# Patient Record
Sex: Male | Born: 1950 | Race: White | Hispanic: No | Smoking: Current some day smoker
Health system: Southern US, Community
[De-identification: ages and names within clinical notes are randomized; demographics above are authoritative.]

## PROBLEM LIST (undated history)

## (undated) DIAGNOSIS — I739 Peripheral vascular disease, unspecified: Secondary | ICD-10-CM

## (undated) DIAGNOSIS — I701 Atherosclerosis of renal artery: Secondary | ICD-10-CM

## (undated) DIAGNOSIS — Z89511 Acquired absence of right leg below knee: Secondary | ICD-10-CM

## (undated) DIAGNOSIS — K8020 Calculus of gallbladder without cholecystitis without obstruction: Secondary | ICD-10-CM

## (undated) DIAGNOSIS — F329 Major depressive disorder, single episode, unspecified: Secondary | ICD-10-CM

## (undated) DIAGNOSIS — E785 Hyperlipidemia, unspecified: Secondary | ICD-10-CM

## (undated) DIAGNOSIS — G3184 Mild cognitive impairment, so stated: Secondary | ICD-10-CM

## (undated) DIAGNOSIS — N200 Calculus of kidney: Secondary | ICD-10-CM

## (undated) DIAGNOSIS — I70229 Atherosclerosis of native arteries of extremities with rest pain, unspecified extremity: Secondary | ICD-10-CM

## (undated) DIAGNOSIS — F32A Depression, unspecified: Secondary | ICD-10-CM

## (undated) DIAGNOSIS — Z7902 Long term (current) use of antithrombotics/antiplatelets: Secondary | ICD-10-CM

## (undated) DIAGNOSIS — I2510 Atherosclerotic heart disease of native coronary artery without angina pectoris: Secondary | ICD-10-CM

## (undated) DIAGNOSIS — G459 Transient cerebral ischemic attack, unspecified: Secondary | ICD-10-CM

## (undated) DIAGNOSIS — I10 Essential (primary) hypertension: Secondary | ICD-10-CM

## (undated) DIAGNOSIS — R112 Nausea with vomiting, unspecified: Secondary | ICD-10-CM

## (undated) DIAGNOSIS — I7143 Infrarenal abdominal aortic aneurysm, without rupture: Secondary | ICD-10-CM

## (undated) DIAGNOSIS — N182 Chronic kidney disease, stage 2 (mild): Secondary | ICD-10-CM

## (undated) DIAGNOSIS — E119 Type 2 diabetes mellitus without complications: Secondary | ICD-10-CM

## (undated) DIAGNOSIS — E559 Vitamin D deficiency, unspecified: Secondary | ICD-10-CM

## (undated) DIAGNOSIS — I70221 Atherosclerosis of native arteries of extremities with rest pain, right leg: Secondary | ICD-10-CM

## (undated) DIAGNOSIS — I452 Bifascicular block: Secondary | ICD-10-CM

## (undated) DIAGNOSIS — Z7982 Long term (current) use of aspirin: Secondary | ICD-10-CM

## (undated) DIAGNOSIS — Z95828 Presence of other vascular implants and grafts: Secondary | ICD-10-CM

## (undated) DIAGNOSIS — D638 Anemia in other chronic diseases classified elsewhere: Secondary | ICD-10-CM

## (undated) DIAGNOSIS — H9011 Conductive hearing loss, unilateral, right ear, with unrestricted hearing on the contralateral side: Secondary | ICD-10-CM

## (undated) DIAGNOSIS — K5790 Diverticulosis of intestine, part unspecified, without perforation or abscess without bleeding: Secondary | ICD-10-CM

## (undated) DIAGNOSIS — I639 Cerebral infarction, unspecified: Secondary | ICD-10-CM

## (undated) DIAGNOSIS — R918 Other nonspecific abnormal finding of lung field: Secondary | ICD-10-CM

## (undated) DIAGNOSIS — I700 Atherosclerosis of aorta: Secondary | ICD-10-CM

## (undated) HISTORY — DX: Depression, unspecified: F32.A

## (undated) HISTORY — DX: Hyperlipidemia, unspecified: E78.5

## (undated) HISTORY — DX: Peripheral vascular disease, unspecified: I73.9

## (undated) HISTORY — PX: OTHER SURGICAL HISTORY: SHX169

## (undated) HISTORY — PX: BELOW KNEE LEG AMPUTATION: SUR23

## (undated) HISTORY — DX: Major depressive disorder, single episode, unspecified: F32.9

---

## 2003-07-26 ENCOUNTER — Ambulatory Visit (HOSPITAL_COMMUNITY): Admission: RE | Admit: 2003-07-26 | Discharge: 2003-07-26 | Payer: Self-pay | Admitting: *Deleted

## 2003-07-26 ENCOUNTER — Encounter: Payer: Self-pay | Admitting: *Deleted

## 2003-07-30 ENCOUNTER — Inpatient Hospital Stay (HOSPITAL_COMMUNITY): Admission: RE | Admit: 2003-07-30 | Discharge: 2003-08-01 | Payer: Self-pay | Admitting: Vascular Surgery

## 2003-08-23 ENCOUNTER — Inpatient Hospital Stay (HOSPITAL_COMMUNITY): Admission: AD | Admit: 2003-08-23 | Discharge: 2003-08-29 | Payer: Self-pay | Admitting: Vascular Surgery

## 2003-08-23 ENCOUNTER — Encounter: Payer: Self-pay | Admitting: Vascular Surgery

## 2003-08-29 ENCOUNTER — Inpatient Hospital Stay (HOSPITAL_COMMUNITY)
Admission: RE | Admit: 2003-08-29 | Discharge: 2003-09-06 | Payer: Self-pay | Admitting: Physical Medicine & Rehabilitation

## 2003-09-30 ENCOUNTER — Encounter
Admission: RE | Admit: 2003-09-30 | Discharge: 2003-12-29 | Payer: Self-pay | Admitting: Physical Medicine & Rehabilitation

## 2003-10-01 ENCOUNTER — Encounter
Admission: RE | Admit: 2003-10-01 | Discharge: 2003-12-30 | Payer: Self-pay | Admitting: Physical Medicine & Rehabilitation

## 2004-01-29 ENCOUNTER — Encounter
Admission: RE | Admit: 2004-01-29 | Discharge: 2004-04-28 | Payer: Self-pay | Admitting: Physical Medicine & Rehabilitation

## 2004-02-10 ENCOUNTER — Encounter
Admission: RE | Admit: 2004-02-10 | Discharge: 2004-05-10 | Payer: Self-pay | Admitting: Physical Medicine & Rehabilitation

## 2004-05-18 ENCOUNTER — Encounter
Admission: RE | Admit: 2004-05-18 | Discharge: 2004-08-16 | Payer: Self-pay | Admitting: Physical Medicine & Rehabilitation

## 2004-05-28 ENCOUNTER — Encounter
Admission: RE | Admit: 2004-05-28 | Discharge: 2004-08-26 | Payer: Self-pay | Admitting: Physical Medicine & Rehabilitation

## 2004-08-28 ENCOUNTER — Encounter
Admission: RE | Admit: 2004-08-28 | Discharge: 2004-11-24 | Payer: Self-pay | Admitting: Physical Medicine & Rehabilitation

## 2004-08-31 ENCOUNTER — Ambulatory Visit: Payer: Self-pay | Admitting: Physical Medicine & Rehabilitation

## 2004-11-24 ENCOUNTER — Encounter
Admission: RE | Admit: 2004-11-24 | Discharge: 2005-02-22 | Payer: Self-pay | Admitting: Physical Medicine & Rehabilitation

## 2004-12-15 ENCOUNTER — Ambulatory Visit: Payer: Self-pay | Admitting: Physical Medicine & Rehabilitation

## 2005-03-11 ENCOUNTER — Encounter
Admission: RE | Admit: 2005-03-11 | Discharge: 2005-06-09 | Payer: Self-pay | Admitting: Physical Medicine & Rehabilitation

## 2005-04-16 ENCOUNTER — Ambulatory Visit: Payer: Self-pay | Admitting: Physical Medicine & Rehabilitation

## 2005-10-04 ENCOUNTER — Encounter
Admission: RE | Admit: 2005-10-04 | Discharge: 2006-01-02 | Payer: Self-pay | Admitting: Physical Medicine & Rehabilitation

## 2005-11-12 ENCOUNTER — Ambulatory Visit: Payer: Self-pay | Admitting: Physical Medicine & Rehabilitation

## 2014-01-06 DIAGNOSIS — G459 Transient cerebral ischemic attack, unspecified: Secondary | ICD-10-CM

## 2014-01-06 HISTORY — PX: TRANSTHORACIC ECHOCARDIOGRAM: SHX275

## 2014-01-06 HISTORY — DX: Transient cerebral ischemic attack, unspecified: G45.9

## 2014-03-06 DIAGNOSIS — I639 Cerebral infarction, unspecified: Secondary | ICD-10-CM

## 2014-03-06 HISTORY — DX: Cerebral infarction, unspecified: I63.9

## 2014-03-25 ENCOUNTER — Inpatient Hospital Stay (HOSPITAL_COMMUNITY): Payer: Medicare Other

## 2014-03-25 ENCOUNTER — Emergency Department (HOSPITAL_COMMUNITY): Payer: Medicare Other

## 2014-03-25 ENCOUNTER — Inpatient Hospital Stay (HOSPITAL_COMMUNITY)
Admission: EM | Admit: 2014-03-25 | Discharge: 2014-03-28 | DRG: 065 | Disposition: A | Discharge status: Rehabilitation Facility | Payer: Medicare Other | Attending: Internal Medicine | Admitting: Internal Medicine

## 2014-03-25 ENCOUNTER — Encounter (HOSPITAL_COMMUNITY): Payer: Self-pay | Admitting: Emergency Medicine

## 2014-03-25 DIAGNOSIS — I639 Cerebral infarction, unspecified: Secondary | ICD-10-CM

## 2014-03-25 DIAGNOSIS — Z8673 Personal history of transient ischemic attack (TIA), and cerebral infarction without residual deficits: Secondary | ICD-10-CM

## 2014-03-25 DIAGNOSIS — Z720 Tobacco use: Secondary | ICD-10-CM | POA: Diagnosis present

## 2014-03-25 DIAGNOSIS — F17200 Nicotine dependence, unspecified, uncomplicated: Secondary | ICD-10-CM

## 2014-03-25 DIAGNOSIS — E785 Hyperlipidemia, unspecified: Secondary | ICD-10-CM | POA: Diagnosis present

## 2014-03-25 DIAGNOSIS — R471 Dysarthria and anarthria: Secondary | ICD-10-CM | POA: Diagnosis present

## 2014-03-25 DIAGNOSIS — I10 Essential (primary) hypertension: Secondary | ICD-10-CM

## 2014-03-25 DIAGNOSIS — S88119A Complete traumatic amputation at level between knee and ankle, unspecified lower leg, initial encounter: Secondary | ICD-10-CM

## 2014-03-25 DIAGNOSIS — I6350 Cerebral infarction due to unspecified occlusion or stenosis of unspecified cerebral artery: Secondary | ICD-10-CM

## 2014-03-25 DIAGNOSIS — I519 Heart disease, unspecified: Secondary | ICD-10-CM

## 2014-03-25 DIAGNOSIS — I6330 Cerebral infarction due to thrombosis of unspecified cerebral artery: Principal | ICD-10-CM | POA: Diagnosis present

## 2014-03-25 DIAGNOSIS — G8100 Flaccid hemiplegia affecting unspecified side: Secondary | ICD-10-CM | POA: Diagnosis present

## 2014-03-25 HISTORY — DX: Essential (primary) hypertension: I10

## 2014-03-25 HISTORY — DX: Cerebral infarction, unspecified: I63.9

## 2014-03-25 HISTORY — DX: Transient cerebral ischemic attack, unspecified: G45.9

## 2014-03-25 LAB — CBC WITH DIFFERENTIAL/PLATELET
Basophils Absolute: 0.1 10*3/uL (ref 0.0–0.1)
Basophils Relative: 1 % (ref 0–1)
EOS ABS: 0.3 10*3/uL (ref 0.0–0.7)
Eosinophils Relative: 4 % (ref 0–5)
HCT: 45.8 % (ref 39.0–52.0)
Hemoglobin: 15.6 g/dL (ref 13.0–17.0)
LYMPHS PCT: 25 % (ref 12–46)
Lymphs Abs: 2.3 10*3/uL (ref 0.7–4.0)
MCH: 31.3 pg (ref 26.0–34.0)
MCHC: 34.1 g/dL (ref 30.0–36.0)
MCV: 91.8 fL (ref 78.0–100.0)
Monocytes Absolute: 0.5 10*3/uL (ref 0.1–1.0)
Monocytes Relative: 6 % (ref 3–12)
Neutro Abs: 6 10*3/uL (ref 1.7–7.7)
Neutrophils Relative %: 64 % (ref 43–77)
PLATELETS: 208 10*3/uL (ref 150–400)
RBC: 4.99 MIL/uL (ref 4.22–5.81)
RDW: 15.1 % (ref 11.5–15.5)
WBC: 9.2 10*3/uL (ref 4.0–10.5)

## 2014-03-25 LAB — I-STAT TROPONIN, ED: Troponin i, poc: 0 ng/mL (ref 0.00–0.08)

## 2014-03-25 LAB — PROTIME-INR
INR: 1.01 (ref 0.00–1.49)
Prothrombin Time: 13.1 seconds (ref 11.6–15.2)

## 2014-03-25 LAB — COMPREHENSIVE METABOLIC PANEL
ALT: 15 U/L (ref 0–53)
AST: 19 U/L (ref 0–37)
Albumin: 3.3 g/dL — ABNORMAL LOW (ref 3.5–5.2)
Alkaline Phosphatase: 70 U/L (ref 39–117)
BILIRUBIN TOTAL: 0.3 mg/dL (ref 0.3–1.2)
BUN: 14 mg/dL (ref 6–23)
CHLORIDE: 100 meq/L (ref 96–112)
CO2: 20 meq/L (ref 19–32)
Calcium: 9.2 mg/dL (ref 8.4–10.5)
Creatinine, Ser: 0.82 mg/dL (ref 0.50–1.35)
GFR calc Af Amer: >90 mL/min (ref >90)
GFR calc non Af Amer: >90 mL/min (ref >90)
Glucose, Bld: 171 mg/dL — ABNORMAL HIGH (ref 70–99)
Potassium: 4.4 mEq/L (ref 3.7–5.3)
SODIUM: 135 meq/L — AB (ref 137–147)
Total Protein: 7.5 g/dL (ref 6.0–8.3)

## 2014-03-25 LAB — URINALYSIS, ROUTINE W REFLEX MICROSCOPIC
Bilirubin Urine: NEGATIVE
Glucose, UA: NEGATIVE mg/dL
KETONES UR: NEGATIVE mg/dL
Nitrite: NEGATIVE
PROTEIN: 100 mg/dL — AB
Specific Gravity, Urine: 1.016 (ref 1.005–1.030)
UROBILINOGEN UA: 0.2 mg/dL (ref 0.0–1.0)
pH: 6.5 (ref 5.0–8.0)

## 2014-03-25 LAB — RAPID URINE DRUG SCREEN, HOSP PERFORMED
AMPHETAMINES: NOT DETECTED
BENZODIAZEPINES: NOT DETECTED
Barbiturates: NOT DETECTED
Cocaine: NOT DETECTED
Opiates: NOT DETECTED
TETRAHYDROCANNABINOL: NOT DETECTED

## 2014-03-25 LAB — URINE MICROSCOPIC-ADD ON

## 2014-03-25 LAB — APTT: APTT: 33 s (ref 24–37)

## 2014-03-25 MED ORDER — SODIUM CHLORIDE 0.9 % IV SOLN
INTRAVENOUS | Status: DC
  Administered 2014-03-25: 14:00:00 via INTRAVENOUS
  Administered 2014-03-26: 1000 mL via INTRAVENOUS

## 2014-03-25 MED ORDER — MORPHINE SULFATE 2 MG/ML IJ SOLN: 2.0000 mg | INTRAMUSCULAR | Status: DC | PRN

## 2014-03-25 MED ORDER — ASPIRIN 325 MG PO TABS
325.0000 mg | ORAL_TABLET | Freq: Every day | ORAL | Status: DC
  Administered 2014-03-25 – 2014-03-28 (×4): 325 mg via ORAL
  Filled 2014-03-25 (×4): qty 1

## 2014-03-25 MED ORDER — ACETAMINOPHEN 325 MG PO TABS: 650.0000 mg | ORAL_TABLET | Freq: Four times a day (QID) | ORAL | Status: DC | PRN

## 2014-03-25 MED ORDER — NICOTINE 14 MG/24HR TD PT24
14.0000 mg | MEDICATED_PATCH | Freq: Every day | TRANSDERMAL | Status: DC
  Administered 2014-03-25 – 2014-03-28 (×4): 14 mg via TRANSDERMAL
  Filled 2014-03-25 (×6): qty 1

## 2014-03-25 MED ORDER — ACETAMINOPHEN 650 MG RE SUPP: 650.0000 mg | Freq: Four times a day (QID) | RECTAL | Status: DC | PRN

## 2014-03-25 MED ORDER — SODIUM CHLORIDE 0.9 % IJ SOLN
3.0000 mL | Freq: Two times a day (BID) | INTRAMUSCULAR | Status: DC
  Administered 2014-03-25 – 2014-03-28 (×6): 3 mL via INTRAVENOUS

## 2014-03-25 MED ORDER — HEPARIN SODIUM (PORCINE) 5000 UNIT/ML IJ SOLN
5000.0000 [IU] | Freq: Three times a day (TID) | INTRAMUSCULAR | Status: DC
Start: 2014-03-25 — End: 2014-03-28
  Administered 2014-03-25 – 2014-03-28 (×9): 5000 [IU] via SUBCUTANEOUS
  Filled 2014-03-25 (×12): qty 1

## 2014-03-25 NOTE — Progress Notes (Signed)
Rehab Admissions Coordinator Note:  Patient was screened by Cleatrice Burke for appropriateness for an Inpatient Acute Rehab Consult per OT recommendation.  At this time, we are recommending Inpatient Rehab consult. Please place order.  Audelia Acton [facility] 03/25/2014, 2:34 PM  I can be reached at [phone].

## 2014-03-25 NOTE — Progress Notes (Signed)
Report received from ED at [place] and pt arrived on unit via stretcher on 1005. Pt A&O x4, RUE remains flaccid; right facial droop remains; pt IV intact and saline locked; pt oriented to unit, denies any pain; pt placed on telemetry; family remains at bedside; NIH 5; call light within reach and will continue to monitor pt quietly. Francis Gaines Rajat Staver RN.

## 2014-03-25 NOTE — Progress Notes (Signed)
OT Cancellation Note  Patient Details Name: Jordan Arellano MRN: [id number] DOB: November 25, 1951   Cancelled Treatment:    Reason Eval/Treat Not Completed: Medical issues which prohibited therapy;Other (comment) (pt on bedrest.)  Benito Mccreedy OTR/L [phone] 03/25/2014, 1:10 PM

## 2014-03-25 NOTE — H&P (Signed)
[facility] History and Physical  [name] URK:[id number] DOB: [birth year] DOA: 03/25/2014  Referring physician: Emergency Department PCP: No PCP Per Patient  Specialists:   Chief Complaint: R sided weakness  HPI: Jordan Arellano is a 63 y.o. male  With a hx of hypertension and tobacco abuse who presents to the ED with R sided weakness upon awakening earlier this AM. The patient recalled feeling normal when going to bed the night prior. In the ED, the patient was found to have an unremarkable head CT. Neurology was consulted and the Hospitalist consulted for admission.  Review of Systems: Per above, the remainder of the 10pt ros reviewed and are neg  Past Medical History  Diagnosis Date  . Hypertension   . TIA (transient ischemic attack)    Past Surgical History  Procedure Laterality Date  . Ambutation    . Below knee leg amputation Right    Social History:  reports that he has been smoking Cigarettes.  He has been smoking about 0.50 packs per day. He does not have any smokeless tobacco history on file. He reports that he does not drink alcohol. His drug history is not on file.  where does patient live--home, ALF, SNF? and with whom if at home?  Can patient participate in ADLs?  No Known Allergies  History reviewed. No pertinent family history.  (be sure to complete)  Prior to Admission medications   Medication Sig Start Date End Date Taking? Authorizing Provider  olmesartan (BENICAR) 40 MG tablet Take 40 mg by mouth daily.   Yes Historical Provider, MD   Physical Exam: Filed Vitals:   03/25/14 0826 03/25/14 0830 03/25/14 0913 03/25/14 0930  BP:  142/91  152/85  Pulse: 71 70  72  Temp:   98 F (36.7 C)   TempSrc:      Resp: 14 18  16   Height:      Weight:      SpO2: 98% 96%  97%     General:  Awake, in nad  Eyes: PERRL B  ENT: membranes moist, dentition fair  Neck: trachea midline, neck supple  Cardiovascular: regular, s1, s2  Respiratory: normal  resp effort, no wheezing  Abdomen: soft, nondistended  Skin: normal skin turgor, no abnormal skin lesions seen  Musculoskeletal: perfused, no clubbing  Psychiatric: mood/affect normal // no auditory/visual hallucinations  Neurologic: R sided facial droop w/ slurred speech // flacid RUE, 3/5 strength in RLE, 5/5 strength on L side  Labs on Admission:  Basic Metabolic Panel:  Recent Labs Lab 03/25/14 0740  NA 135*  K 4.4  CL 100  CO2 20  GLUCOSE 171*  BUN 14  CREATININE 0.82  CALCIUM 9.2   Liver Function Tests:  Recent Labs Lab 03/25/14 0740  AST 19  ALT 15  ALKPHOS 70  BILITOT 0.3  PROT 7.5  ALBUMIN 3.3*   No results found for this basename: LIPASE, AMYLASE,  in the last 168 hours No results found for this basename: AMMONIA,  in the last 168 hours CBC:  Recent Labs Lab 03/25/14 0740  WBC 9.2  NEUTROABS 6.0  HGB 15.6  HCT 45.8  MCV 91.8  PLT 208   Cardiac Enzymes: No results found for this basename: CKTOTAL, CKMB, CKMBINDEX, TROPONINI,  in the last 168 hours  BNP (last 3 results) No results found for this basename: PROBNP,  in the last 8760 hours CBG: No results found for this basename: GLUCAP,  in the last 168 hours  Radiological Exams on  Admission: Ct Head Wo Contrast  03/25/2014   CLINICAL DATA:  Right-sided weakness and facial droop.  EXAM: CT HEAD WITHOUT CONTRAST  TECHNIQUE: Contiguous axial images were obtained from the base of the skull through the vertex without intravenous contrast.  COMPARISON:  None.  FINDINGS: Brain does not show accelerated atrophy. There is no evidence of old or acute focal infarction, mass lesion, hemorrhage, hydrocephalus or extra-axial collection. There is extensive atherosclerotic calcification of the major vessels at the base of the brain, most notably the right vertebral artery and both cavernous carotid arteries. The calvarium is unremarkable. There is mild mucosal inflammatory change of the paranasal sinuses.   IMPRESSION: Normal appearance of the brain itself. No evidence of acute infarction. Extensive atherosclerotic calcification of the major vessels the base of the brain.   Electronically Signed   By: Nelson Chimes M.D.   On: 03/25/2014 07:59    EKG: Independently reviewed. NSR  Assessment/Plan Principal Problem:   CVA (cerebral infarction) Active Problems:   HTN (hypertension)   Tobacco abuse  1. Suspected CVA 1. CT neg 2. Await MRI 3. 2d echo and carotids ordered 4. Pt is antiplatelet naive. Will start empiric 325mg  ASA 5. Allow permissive htn 6. Admit to med-tele 7. Neuro consulted 8. Consult PT/OT/SLP 2. HTN 1. Allow permissive htn per above 2. Hold bp med 3. Tobacco abuse 1. 97min tobacco cessation done face to face 2. Nicotine patch 4. DVT prophylaxis 1. Heparin subQ  Code Status: Full (must indicate code status--if unknown or must be presumed, indicate so) Family Communication: Pt and son with daughter in law in room (indicate person spoken with, if applicable, with phone number if by telephone) Disposition Plan: Pending (indicate anticipated LOS)  Time spent: 59min  Stephen K Chiu [facility] Pager [phone]  If 7PM-7AM, please contact night-coverage [email or web address] Password [facility] 03/25/2014, 9:39 AM

## 2014-03-25 NOTE — Progress Notes (Signed)
CARE MANAGEMENT NOTE 03/25/2014  Patient:  Jordan Arellano, Jordan Arellano   Account Number:  [id number]  Date Initiated:  03/25/2014  Documentation initiated by:  Olga Coaster  Subjective/Objective Assessment:   ADMITTED FOR STROKE WORKUP     Action/Plan:   CM FOLLOWING FOR DCP   Anticipated DC Date:  04/01/2014   Anticipated DC Plan:  AWAITING FOR PT/OT EVALS FOR DISPOSITION NEEDS WITH ATTENDING MD IN AGREEMENT     DC Planning Services  CM consult       Status of service:  In process, will continue to follow Medicare Important Message given?  NA - LOS <3 / Initial given by admissions (If response is "NO", the following Medicare IM given date fields will be blank)  Per UR Regulation:  Reviewed for med. necessity/level of care/duration of stay  Comments:  4/20/2015Mindi Slicker RN,BSN,MHA [phone]

## 2014-03-25 NOTE — Progress Notes (Signed)
Pt returned back from MRI in room. Francis Gaines Josanna Hefel RN.

## 2014-03-25 NOTE — Consult Note (Signed)
Referring Physician: Earlie Counts    Chief Complaint: Stroke  HPI:                                                                                                                                         Jordan Arellano is an 63 y.o. male who went to sleep last night feeling normal at 9:30 PM.  He awoke at 0330 am and noted that his left arm was weak but he was able to move it antigravity.  As the morning has progressed his right arm has become flaccid. In addition he noted his left arm has decreased sensation from shoulder to hand and left leg from knee below is weak. He denies any sensation changes in his left leg or face. He admits to smoking ~pack per day. He does not take ASA.   Date last known well: Date: 03/24/2014 Time last known well: Time: 21:30 tPA Given: No: out of the window NIHSS 7, 3 for RUE, 2 for face, 1 for dysarthria 1 for facial sensation  Past Medical History  Diagnosis Date  . Hypertension   . TIA (transient ischemic attack)     Past Surgical History  Procedure Laterality Date  . Ambutation    . Below knee leg amputation Right     Family History  Problem Relation Age of Onset  . Hypertension Mother   . Hypertension Father    Social History:  reports that he has been smoking Cigarettes.  He has been smoking about 0.50 packs per day. He does not have any smokeless tobacco history on file. He reports that he does not drink alcohol. His drug history is not on file.  Allergies: No Known Allergies  Medications:                                                                                                                           Current Facility-Administered Medications  Medication Dose Route Frequency Provider Last Rate Last Dose  . 0.9 %  sodium chloride infusion   Intravenous Continuous Donne Hazel, MD      . acetaminophen (TYLENOL) tablet 650 mg  650 mg Oral Q6H PRN Donne Hazel, MD       Or  . acetaminophen (TYLENOL) suppository 650 mg  650 mg Rectal Q6H PRN  Donne Hazel, MD      . aspirin  tablet 325 mg  325 mg Oral Daily Jerald Kief, MD      . heparin injection 5,000 Units  5,000 Units Subcutaneous 3 times per day Jerald Kief, MD      . morphine 2 MG/ML injection 2 mg  2 mg Intravenous Q4H PRN Jerald Kief, MD      . nicotine (NICODERM CQ - dosed in mg/24 hours) patch 14 mg  14 mg Transdermal Daily Jerald Kief, MD      . sodium chloride 0.9 % injection 3 mL  3 mL Intravenous Q12H Jerald Kief, MD       Current Outpatient Prescriptions  Medication Sig Dispense Refill  . olmesartan (BENICAR) 40 MG tablet Take 40 mg by mouth daily.         ROS:                                                                                                                                       History obtained from the patient  General ROS: negative for - chills, fatigue, fever, night sweats, weight gain or weight loss Psychological ROS: negative for - behavioral disorder, hallucinations, memory difficulties, mood swings or suicidal ideation Ophthalmic ROS: negative for - blurry vision, double vision, eye pain or loss of vision ENT ROS: negative for - epistaxis, nasal discharge, oral lesions, sore throat, tinnitus or vertigo Allergy and Immunology ROS: negative for - hives or itchy/watery eyes Hematological and Lymphatic ROS: negative for - bleeding problems, bruising or swollen lymph nodes Endocrine ROS: negative for - galactorrhea, hair pattern changes, polydipsia/polyuria or temperature intolerance Respiratory ROS: negative for - cough, hemoptysis, shortness of breath or wheezing Cardiovascular ROS: negative for - chest pain, dyspnea on exertion, edema or irregular heartbeat Gastrointestinal ROS: negative for - abdominal pain, diarrhea, hematemesis, nausea/vomiting or stool incontinence Genito-Urinary ROS: negative for - dysuria, hematuria, incontinence or urinary frequency/urgency Musculoskeletal ROS: negative for - joint swelling or muscular  weakness Neurological ROS: as noted in HPI Dermatological ROS: negative for rash and skin lesion changes  Neurologic Examination:                                                                                                      Blood pressure 169/93, pulse 66, temperature 98 F (36.7 C), temperature source Oral, resp. rate 19, height 6\' 1"  (1.854 m), weight 77.111 kg (170 lb), SpO2 95.00%.   Mental Status: Alert, oriented, thought content appropriate.  Speech dysarthric without evidence  of aphasia.  Able to follow 3 step commands without difficulty. Cranial Nerves: II: Discs flat bilaterally; Visual fields grossly normal, pupils equal, round, reactive to light and accommodation III,IV, VI: ptosis not present, extra-ocular motions intact bilaterally V,VII: smile asymmetric on the right, facial light touch sensation normal bilaterally. Decreased to pin on right VIII: hearing normal bilaterally IX,X: gag reflex present XI: bilateral shoulder shrug XII: midline tongue extension without atrophy or fasciculations  Motor: Right : Upper extremity   1/5 with flexion, internal rotation   Left:     Upper extremity   0/5  Lower extremity   4-5/5 proximal, 0/5 distal BKA Lower extremity   5/5 Tone and bulk:normal tone throughout; no atrophy noted Sensory: Pinprick and light touch intact throughout, bilaterally Deep Tendon Reflexes:  Right: Upper Extremity   Left: Upper extremity   biceps (C-5 to C-6) 2/4   biceps (C-5 to C-6) 2/4 tricep (C7) 2/4    triceps (C7) 2/4 Brachioradialis (C6) 2/4  Brachioradialis (C6) 2/4  Lower Extremity Lower Extremity  quadriceps (L-2 to L-4) 2/4   quadriceps (L-2 to L-4) 2/4 Achilles (S1) BKA   Achilles (S1) 2/4  Plantars: Right: BKA   Left: downgoing Cerebellar: normal finger-to-nose on the left,  normal heel-to-shin test on the left Gait: not tested due to multiple leads. CV: pulses palpable throughout    Lab Results: Basic Metabolic  Panel:  Recent Labs Lab 03/25/14 0740  NA 135*  K 4.4  CL 100  CO2 20  GLUCOSE 171*  BUN 14  CREATININE 0.82  CALCIUM 9.2    Liver Function Tests:  Recent Labs Lab 03/25/14 0740  AST 19  ALT 15  ALKPHOS 70  BILITOT 0.3  PROT 7.5  ALBUMIN 3.3*   No results found for this basename: LIPASE, AMYLASE,  in the last 168 hours No results found for this basename: AMMONIA,  in the last 168 hours  CBC:  Recent Labs Lab 03/25/14 0740  WBC 9.2  NEUTROABS 6.0  HGB 15.6  HCT 45.8  MCV 91.8  PLT 208    Cardiac Enzymes: No results found for this basename: CKTOTAL, CKMB, CKMBINDEX, TROPONINI,  in the last 168 hours  Lipid Panel: No results found for this basename: CHOL, TRIG, HDL, CHOLHDL, VLDL, LDLCALC,  in the last 168 hours  CBG: No results found for this basename: GLUCAP,  in the last 168 hours  Microbiology: No results found for this or any previous visit.  Coagulation Studies:  Recent Labs  03/25/14 0740  LABPROT 13.1  INR 1.01    Imaging: Ct Head Wo Contrast  03/25/2014   CLINICAL DATA:  Right-sided weakness and facial droop.  EXAM: CT HEAD WITHOUT CONTRAST  TECHNIQUE: Contiguous axial images were obtained from the base of the skull through the vertex without intravenous contrast.  COMPARISON:  None.  FINDINGS: Brain does not show accelerated atrophy. There is no evidence of old or acute focal infarction, mass lesion, hemorrhage, hydrocephalus or extra-axial collection. There is extensive atherosclerotic calcification of the major vessels at the base of the brain, most notably the right vertebral artery and both cavernous carotid arteries. The calvarium is unremarkable. There is mild mucosal inflammatory change of the paranasal sinuses.  IMPRESSION: Normal appearance of the brain itself. No evidence of acute infarction. Extensive atherosclerotic calcification of the major vessels the base of the brain.   Electronically Signed   By: Nelson Chimes M.D.   On:  03/25/2014 07:59  Assessment and plan discussed with with attending physician and they are in agreement.    Etta Quill PA-C [facility] [phone]  03/25/2014, 10:02 AM   I have seen and evaluated the patient. I have reviewed the above note and made appropriate changes.    Assessment: 63 y.o. male presenting with right facial droop, dysarthria, right arm flaccidity and distal leg weakness.  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  Patient is out of window for tPA and intervention.   Stroke Risk Factors - hypertension    Plan: 1. HgbA1c, fasting lipid panel 2. MRI, MRA  of the brain without contrast 3. PT consult, OT consult, Speech consult 4. Echocardiogram 5. Carotid dopplers 6. Prophylactic therapy-Antiplatelet med: Aspirin - dose 81 mg daily 7. Risk  Factor modification   Roland Rack, MD [facility] [phone]  If 7pm- 7am, please page neurology on call as listed in [place].

## 2014-03-25 NOTE — ED Notes (Signed)
Patient transported to CT 

## 2014-03-25 NOTE — ED Notes (Signed)
Admitting at bedside 

## 2014-03-25 NOTE — Progress Notes (Signed)
Pt off to MRI. Delia Heady RN

## 2014-03-25 NOTE — ED Notes (Signed)
Neurology at bedside.

## 2014-03-25 NOTE — ED Provider Notes (Signed)
CSN: [id number]     Arrival date & time 03/25/14  0709 History   First MD Initiated Contact with Patient 03/25/14 [phone]     Chief Complaint  Patient presents with  . Stroke Symptoms     (Consider location/radiation/quality/duration/timing/severity/associated sxs/prior Treatment) The history is provided by the patient.   Patient here with right-sided weakness that began at approximately 4 hours prior to arrival. Patient went to bed at approximately 9:30 in the evening and woke up at approximately 3:30 in the morning with right-sided weakness which gradually progressed until this morning. He is out of the TPA window. He denies any headache. No blurred vision. Symptoms have been progressively worse. He is now unable to lift up his right arm. 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 Notes some trouble speaking. Complains of right-sided facial weakness. Denies any trouble closing his eye. No neck pain. No recent history of trauma. Symptoms persistent and nothing makes it better or worse. No treatment used prior to arrival Past Medical History  Diagnosis Date  . Hypertension   . TIA (transient ischemic attack)    Past Surgical History  Procedure Laterality Date  . Ambutation    . Below knee leg amputation Right    History reviewed. No pertinent family history. History  Substance Use Topics  . Smoking status: Current Some Day Smoker -- 0.50 packs/day    Types: Cigarettes  . Smokeless tobacco: Not on file  . Alcohol Use: No    Review of Systems  All other systems reviewed and are negative.     Allergies  Review of patient's allergies indicates not on file.  Home Medications   Prior to Admission medications   Not on File   BP 154/91  Pulse 72  Temp(Src) 98 F (36.7 C)  Resp 17  Ht 6\' 1"  (1.854 m)  Wt 170 lb (77.111 kg)  BMI 22.43 kg/m2  SpO2 96% Physical Exam  Nursing note and vitals reviewed. Constitutional: He is  oriented to person, place, and time. He appears well-developed and well-nourished.  Non-toxic appearance. No distress.  HENT:  Head: Normocephalic and atraumatic.  Eyes: Conjunctivae, EOM and lids are normal. Pupils are equal, round, and reactive to light.  Neck: Normal range of motion. Neck supple. No tracheal deviation present. No mass present.  Cardiovascular: Normal rate, regular rhythm and normal heart sounds.  Exam reveals no gallop.   No murmur heard. Pulmonary/Chest: Effort normal and breath sounds normal. No stridor. No respiratory distress. He has no decreased breath sounds. He has no wheezes. He has no rhonchi. He has no rales.  Abdominal: Soft. Normal appearance and bowel sounds are normal. He exhibits no distension. There is no tenderness. There is no rebound and no CVA tenderness.  Musculoskeletal: Normal range of motion. He exhibits no edema and no tenderness.  Below-the-knee amputation noted  Neurological: He is alert and oriented to person, place, and time. A cranial nerve deficit and sensory deficit is present. Coordination abnormal. GCS eye subscore is 4. GCS verbal subscore is 5. GCS motor subscore is 6.  Flaccid paralysis noted to right upper extremity. Right-sided facial droop noted. Patient unable to perform finger to nose testing on the right. Left upper and left lower extremity strength normal. Speech normal  Skin: Skin is warm and dry. No abrasion and no rash noted.  Psychiatric: He has a normal mood and affect. His speech is normal and behavior is normal.  ED Course  Procedures (including critical care time) Labs Review Labs Reviewed  PROTIME-INR  APTT  CBC  DIFFERENTIAL  COMPREHENSIVE METABOLIC PANEL  URINE RAPID DRUG SCREEN (HOSP PERFORMED)  URINALYSIS, ROUTINE W REFLEX MICROSCOPIC  CBC WITH DIFFERENTIAL  I-STAT TROPOININ, ED    Imaging Review No results found.   EKG Interpretation   Date/Time:  Monday March 25 2014 07:18:29 EDT Ventricular Rate:   86 PR Interval:  143 QRS Duration: 111 QT Interval:  380 QTC Calculation: 454 R Axis:   -59 Text Interpretation:  Sinus rhythm Abnormal R-wave progression, early  transition Inferior infarct, old Confirmed by Ramin Zoll  MD, Lavell Ridings ([id number])  on 03/25/2014 7:34:55 AM      MDM   Final diagnoses:  None   Spoke to neurology and patient is out of the TPA window. Patient to be admitted to the medicine service      Leota Jacobsen, MD 03/25/14 [phone]

## 2014-03-25 NOTE — ED Notes (Addendum)
Pt from home, c/o right sided paralysis and facial droop,Pt states he went to bed around 930 last night, w/ no deficients. Pt lives alone, no LSN. Pt states he woke up with symptoms. NSD

## 2014-03-25 NOTE — Evaluation (Signed)
Clinical/Bedside Swallow Evaluation Patient Details  Name: Jordan Arellano MRN: [id number] Date of Birth: [birth year]  Today's Date: 03/25/2014 Time: 5462-7035 SLP Time Calculation (min): 27 min  Past Medical History:  Past Medical History  Diagnosis Date  . Hypertension   . TIA (transient ischemic attack)   . Stroke    Past Surgical History:  Past Surgical History  Procedure Laterality Date  . Ambutation    . Below knee leg amputation Right    HPI:  Pt admitted 4/20 with acute nonhemorrhagic left paracentral pontine infarct, Arterial venous malformation left hippocampus.   Assessment / Plan / Recommendation Clinical Impression  Pt presents with mild right-sided lingual/labial weakness that does not appear to impede pt's ability to orally manipulate and control PO trials. No overt s/s of aspiration were observed during evaluation, although pt reports that his swallowing felt "funny" during lunch meal. Pt was unable to elaborate, however given location of acute CVA and subjective report by pt, will f/u x1 to assess diet tolerance with regular textures and thin liquids, and to provide education. Given that pt has been admitted with acute CVA and had difficulty with recall during evaluation, recommend ordering cognitive-linguistic evaluation.    Aspiration Risk  Mild    Diet Recommendation Regular;Thin liquid   Liquid Administration via: Cup;Straw Medication Administration: Whole meds with liquid Supervision: Patient able to self feed;Intermittent supervision to cue for compensatory strategies Compensations: Slow rate;Small sips/bites Postural Changes and/or Swallow Maneuvers: Seated upright 90 degrees;Upright 30-60 min after meal    Other  Recommendations Oral Care Recommendations: Oral care BID   Follow Up Recommendations  None (none anticipated for swallowing; recommend SLE to more fully assess cognitive function)    Frequency and Duration min 1 x/week  1 week   Pertinent  Vitals/Pain N/A    SLP Swallow Goals     Swallow Study Prior Functional Status  Type of Home: House Available Help at Discharge: Family;Available PRN/intermittently    General Date of Onset: 03/25/14 HPI: Pt admitted 4/20 with acute nonhemorrhagic left paracentral pontine infarct, Arterial venous malformation left hippocampus. Type of Study: Bedside swallow evaluation Previous Swallow Assessment: none in chart Diet Prior to this Study: Regular;Thin liquids Temperature Spikes Noted: No Respiratory Status: Room air History of Recent Intubation: No Behavior/Cognition: Alert;Cooperative;Pleasant mood Oral Cavity - Dentition: Edentulous (pt had top/bottom dentures available although declined to wear them for evaluation) Self-Feeding Abilities: Able to feed self;Needs assist Patient Positioning: Upright in bed Baseline Vocal Quality: Clear Volitional Cough: Strong Volitional Swallow: Able to elicit    Oral/Motor/Sensory Function Overall Oral Motor/Sensory Function: Impaired (mild) Labial ROM: Reduced right Labial Symmetry: Abnormal symmetry right Labial Strength: Within Functional Limits Lingual ROM: Within Functional Limits Lingual Symmetry: Within Functional Limits Lingual Strength: Reduced Facial ROM: Within Functional Limits Facial Symmetry: Within Functional Limits Facial Strength: Within Functional Limits Mandible: Within Functional Limits   Ice Chips Ice chips: Not tested   Thin Liquid Thin Liquid: Within functional limits Presentation: Straw;Self Fed    Nectar Thick Nectar Thick Liquid: Not tested   Honey Thick Honey Thick Liquid: Not tested   Puree Puree: Within functional limits Presentation: Self Fed;Spoon   Solid   GO    Solid: Within functional limits Presentation: Self Fed        Germain Osgood, M.A. CCC-SLP [phone]  Germain Osgood 03/25/2014,4:39 PM

## 2014-03-25 NOTE — Evaluation (Addendum)
Occupational Therapy Evaluation Patient Details Name: Jordan Arellano MRN: [id number] DOB: February 07, 1951 Today's Date: 03/25/2014    History of Present Illness Acute nonhemorrhagic left paracentral pontine infarct, Arterial venous malformation left hippocampus. Pt has right BKA.   Clinical Impression   Pt presents with below problem list. Pt independent with ADLs, PTA. Feel pt will benefit from acute OT to increase independence prior to d/c. Feel pt is great CIR candidate. Pt did not have right prosthetic-pt states that son is bringing this in.   Follow Up Recommendations  CIR;Supervision/Assistance - 24 hour    Equipment Recommendations  3 in 1 bedside comode    Recommendations for Other Services       Precautions / Restrictions Precautions Precautions: Fall Restrictions Weight Bearing Restrictions: No      Mobility Bed Mobility Overal bed mobility: Needs Assistance Bed Mobility: Supine to Sit     Supine to sit: Mod assist     General bed mobility comments: assistance with trunk; cues for technique.  Transfers Overall transfer level: Needs assistance   Transfers: Sit to/from Stand Sit to Stand: Max assist;From elevated surface         General transfer comment: performed sit <> stand x2. Pt unsteady, but was able to finally let go of rail for brief period and hold OT's hand.    Balance                                            ADL Overall ADL's : Needs assistance/impaired Eating/Feeding: Set up;Supervision/ safety;Sitting   Grooming: Minimal assistance;Sitting   Upper Body Bathing: Moderate assistance;Sitting   Lower Body Bathing: Moderate assistance;Bed level   Upper Body Dressing : Moderate assistance;Sitting   Lower Body Dressing: Maximal assistance;Bed level   Toilet Transfer: Maximal assistance (sit to stand from bed)   Toileting- Clothing Manipulation and Hygiene: Minimal assistance;Sitting/lateral lean       Functional  mobility during ADLs: Maximal assistance (sit to stand from bed) General ADL Comments: Educated to be careful with RUE as he has decreased sensation and to support RUE with pillow. Pt sat EOB and urinated in urinated. OT assisted in donning left sock.     Vision                     Perception     Praxis      Pertinent Vitals/Pain No pain reported.      Hand Dominance     Extremity/Trunk Assessment Upper Extremity Assessment Upper Extremity Assessment: RUE deficits/detail RUE Deficits / Details: flaccid; able to elevate shoulder RUE Sensation: decreased light touch   Lower Extremity Assessment Lower Extremity Assessment: Defer to PT evaluation          Cognition Arousal/Alertness: Awake/alert Behavior During Therapy: WFL for tasks assessed/performed Overall Cognitive Status: Within Functional Limits for tasks assessed                     General Comments       Exercises       Shoulder Instructions      Home Living Family/patient expects to be discharged to:: Private residence Living Arrangements: Alone Available Help at Discharge: Family;Available PRN/intermittently Type of Home: House Home Access: Stairs to enter [employer] of Steps: 2 in back; 4 in front Entrance Stairs-Rails:  (no rail in back; left rail in front) Home Layout:  One level     Bathroom Shower/Tub: Teacher, early years/pre: Standard     Home Equipment: Other (comment) (has a seat to use for shower)          Prior Functioning/Environment Level of Independence: Independent             OT Diagnosis: Hemiplegia dominant side   OT Problem List: Decreased strength;Impaired balance (sitting and/or standing);Decreased knowledge of use of DME or AE;Decreased knowledge of precautions;Impaired UE functional use;Impaired sensation;Decreased coordination   OT Treatment/Interventions: Self-care/ADL training;Neuromuscular education;DME and/or AE  instruction;Therapeutic activities;Patient/family education;Balance training    OT Goals(Current goals can be found in the care plan section) Acute Rehab OT Goals Patient Stated Goal: not stated OT Goal Formulation: With patient Time For Goal Achievement: 04/01/14 Potential to Achieve Goals: Good ADL Goals Pt Will Perform Upper Body Dressing: with set-up;with supervision;sitting Pt Will Perform Lower Body Dressing: with min assist;sit to/from stand Pt Will Transfer to Toilet: with min assist;ambulating Pt Will Perform Toileting - Clothing Manipulation and hygiene: with supervision;sit to/from stand Additional ADL Goal #1: Pt will perform bed mobility at Supervision level as precursor for ADLs.  OT Frequency: Min 2X/week   Barriers to D/C: Decreased caregiver support          Co-evaluation              End of Session Equipment Utilized During Treatment: Gait belt Nurse Communication: Mobility status;Other (comment) (support RUE with pillow)  Activity Tolerance: Patient tolerated treatment well Patient left: in bed;with call bell/phone within reach;with bed alarm set   Time: 1318-1330 OT Time Calculation (min): 12 min Charges:  OT General Charges $OT Visit: 1 Procedure OT Evaluation $Initial OT Evaluation Tier I: 1 Procedure G-CodesBenito Arellano OTR/L [phone] 03/25/2014, 2:24 PM

## 2014-03-26 DIAGNOSIS — I6330 Cerebral infarction due to thrombosis of unspecified cerebral artery: Secondary | ICD-10-CM

## 2014-03-26 LAB — CBC
HCT: 43.5 % (ref 39.0–52.0)
HEMOGLOBIN: 15.1 g/dL (ref 13.0–17.0)
MCH: 31.7 pg (ref 26.0–34.0)
MCHC: 34.7 g/dL (ref 30.0–36.0)
MCV: 91.2 fL (ref 78.0–100.0)
PLATELETS: 194 10*3/uL (ref 150–400)
RBC: 4.77 MIL/uL (ref 4.22–5.81)
RDW: 15 % (ref 11.5–15.5)
WBC: 10.1 10*3/uL (ref 4.0–10.5)

## 2014-03-26 LAB — COMPREHENSIVE METABOLIC PANEL
ALK PHOS: 61 U/L (ref 39–117)
ALT: 12 U/L (ref 0–53)
AST: 15 U/L (ref 0–37)
Albumin: 3.1 g/dL — ABNORMAL LOW (ref 3.5–5.2)
BUN: 11 mg/dL (ref 6–23)
CO2: 21 mEq/L (ref 19–32)
Calcium: 8.6 mg/dL (ref 8.4–10.5)
Chloride: 104 mEq/L (ref 96–112)
Creatinine, Ser: 0.78 mg/dL (ref 0.50–1.35)
GFR calc Af Amer: >90 mL/min (ref >90)
GFR calc non Af Amer: >90 mL/min (ref >90)
Glucose, Bld: 129 mg/dL — ABNORMAL HIGH (ref 70–99)
POTASSIUM: 4.1 meq/L (ref 3.7–5.3)
SODIUM: 138 meq/L (ref 137–147)
TOTAL PROTEIN: 7 g/dL (ref 6.0–8.3)
Total Bilirubin: 0.5 mg/dL (ref 0.3–1.2)

## 2014-03-26 LAB — HEMOGLOBIN A1C
HEMOGLOBIN A1C: 6.2 % — AB (ref <5.7)
MEAN PLASMA GLUCOSE: 131 mg/dL — AB (ref <117)

## 2014-03-26 NOTE — Evaluation (Signed)
Physical Therapy Evaluation Patient Details Name: Jordan Arellano MRN: [id number] DOB: 1951-02-13 Today's Date: 03/26/2014   History of Present Illness  63 y.o. male admitted to [facility] on 03/15/14 with right sided weakness.  MRI revealed and acute nonhemorrhagic left paracentral pontine infarct, Arterial venous malformation left hippocampus. 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  Other significant PMHx of HTN.   Clinical Impression  Pt is moving well despite right sided weakness.  He is willing to do whatever it takes to get him better and more mobile including CIR level therapies.  My biggest concern for his is donning his prosthesis.  This is often difficult to do with two hands and now he has one functioning hand.   PT to follow acutely for deficits listed below.       Follow Up Recommendations CIR    Equipment Recommendations  Rolling walker with 5" wheels;Wheelchair (measurements PT);Wheelchair cushion (measurements PT) (R PFRW)    Recommendations for Other Services Rehab consult     Precautions / Restrictions Precautions Precautions: Fall Precaution Comments: pt reports no recent h/o falls, but now with new CVA.        Mobility  Bed Mobility Overal bed mobility: Needs Assistance Bed Mobility: Supine to Sit     Supine to sit: Min assist     General bed mobility comments: min assist to support trunk during transition to sit.   Transfers Overall transfer level: Needs assistance Equipment used: None (right leg prosthesis) Transfers: Sit to/from [employer] Transfers Sit to Stand: Mod assist Stand pivot transfers: Mod assist       General transfer comment: mod assist to support trunk for balance and block his right knee for stability in standing. Mod assist to support trunk for balance and pt reaching with his left upper extremity to the armrest of the recliner chair for stability.   Ambulation/Gait             General Gait Details: would be safer with  R PFRW and second person assist.       Modified Rankin (Stroke Patients Only) Modified Rankin (Stroke Patients Only) Pre-Morbid Rankin Score: Slight disability Modified Rankin: Moderately severe disability     Balance Overall balance assessment: Needs assistance Sitting-balance support: No upper extremity supported;Feet supported Sitting balance-Leahy Scale: Good     Standing balance support: Single extremity supported Standing balance-Leahy Scale: Poor                               Pertinent Vitals/Pain See vitals flow sheet.     Home Living Family/patient expects to be discharged to:: Private residence Living Arrangements: Alone Available Help at Discharge: Family;Available PRN/intermittently Type of Home: House Home Access: Stairs to enter Entrance Stairs-Rails:  (no rail in back; left rail in front) Entrance Stairs-Number of Steps: 2 in back; 4 in front Home Layout: One level Home Equipment: Other (comment) (has a seat to use for shower) Additional Comments: wears glasses to read, does not drive.      Prior Function Level of Independence: Independent         Comments: Does not use a cane or a walker, but does use his prosthesis with gait.       Hand Dominance   Dominant Hand: Right    Extremity/Trunk Assessment   Upper Extremity Assessment: Defer to OT evaluation           Lower Extremity Assessment: RLE  deficits/detail;LLE deficits/detail RLE Deficits / Details: right leg with BKA ~10 years ago.  Pt's prosthesis is old and has an extra insert in it to take up space.  His silacone liner is also in need of being replaced.  The pt is weak in his right arm and leg.  Knee extension/flex 3+/5, hip flexion 3+/5.   LLE Deficits / Details: WFL 5/5  Cervical / Trunk Assessment: Normal  Communication   Communication: No difficulties  Cognition Arousal/Alertness: Awake/alert Behavior During Therapy: WFL for tasks assessed/performed Overall  Cognitive Status: Within Functional Limits for tasks assessed                      General Comments General comments (skin integrity, edema, etc.): Pt unable to donn his prosthesis without assistance due to right hand and arm impaired.            Assessment/Plan    PT Assessment Patient needs continued PT services  PT Diagnosis Difficulty walking;Abnormality of gait;Generalized weakness;Hemiplegia dominant side   PT Problem List Decreased strength;Decreased activity tolerance;Decreased balance;Decreased mobility;Decreased knowledge of use of DME  PT Treatment Interventions DME instruction;Stair training;Gait training;Functional mobility training;Therapeutic activities;Therapeutic exercise;Balance training;Neuromuscular re-education;Cognitive remediation;Patient/family education;Wheelchair mobility training   PT Goals (Current goals can be found in the Care Plan section) Acute Rehab PT Goals Patient Stated Goal: to do what we think is best PT Goal Formulation: With patient Time For Goal Achievement: 04/09/14 Potential to Achieve Goals: Good    Frequency Min 4X/week   Barriers to discharge Inaccessible home environment;Decreased caregiver support stairs to enter his home and lives alone       End of Session Equipment Utilized During Treatment: Gait belt Activity Tolerance: Patient tolerated treatment well Patient left: in chair;with call bell/phone within reach;with chair alarm set Nurse Communication: Mobility status         Time: 0539-7673 PT Time Calculation (min): 27 min   Charges:    1 Eval, 1 TA          Serafina Topham B. [place], [place], DPT [phone]   03/26/2014, 3:16 PM

## 2014-03-26 NOTE — Progress Notes (Signed)
VASCULAR LAB PRELIMINARY  PRELIMINARY  PRELIMINARY  PRELIMINARY  Carotid duplex completed.    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 1-39% stenosis bilaterally.  Right vertebral artery flow antegrade.  Lt vertebral artery not found.   Margarette [place], RVT 03/26/2014, 9:21 AM

## 2014-03-26 NOTE — Consult Note (Signed)
Physical Medicine and Rehabilitation Consult Reason for Consult: CVA Referring Physician: [facility]   HPI: Jordan Arellano is a 63 y.o. 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 Patient independent prior to admission living alone. Admitted 03/25/2014 with right-sided weakness and dysarthria. 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 Echocardiogram with ejection fraction 46% grade 1 diastolic dysfunction. Carotid Dopplers are pending. Patient did not receive TPA. Placed on aspirin for CVA prophylaxis with subcutaneous Septra for DVT prophylaxis. NicoDerm patch placed for history of tobacco abuse. Tolerating a regular consistency diet. Occupational therapy evaluation completed 03/25/2014 with recommendations for physical medicine rehabilitation consult.   Review of Systems  Gastrointestinal: Positive for constipation.  Musculoskeletal: Positive for myalgias.  Neurological: Positive for speech change and weakness.  All other systems reviewed and are negative.  Past Medical History  Diagnosis Date  . Hypertension   . TIA (transient ischemic attack)   . Stroke    Past Surgical History  Procedure Laterality Date  . Ambutation    . Below knee leg amputation Right    Family History  Problem Relation Age of Onset  . Hypertension Mother   . Hypertension Father    Social History:  reports that he has been smoking Cigarettes.  He has a 17.5 pack-year smoking history. He has never used smokeless tobacco. He reports that he does not drink alcohol or use illicit drugs. Allergies: No Known Allergies Medications Prior to Admission  Medication Sig Dispense Refill  . olmesartan (BENICAR) 40 MG tablet Take 40 mg by mouth daily.         Home: Home Living Family/patient expects to be discharged to:: Private residence Living Arrangements: Alone Available Help at Discharge: Family;Available PRN/intermittently Type of Home: House Home Access: Stairs to enter [employer] of Steps: 2 in back; 4 in front Entrance Stairs-Rails:  (no rail in back; left rail in front) Home Layout: One level Home Equipment: Other (comment) (has a seat to use for shower)  Functional History: Prior Function Level of Independence: Independent Functional Status:  Mobility: Bed Mobility Overal bed mobility: Needs Assistance Bed Mobility: Supine to Sit Supine to sit: Mod assist General bed mobility comments: assistance with trunk; cues for technique. Transfers Overall transfer level: Needs assistance Transfers: Sit to/from Stand Sit to Stand: Max assist;From elevated surface General transfer comment: performed sit <> stand x2. Pt unsteady, but was able to finally let go of rail for brief period and hold OT's hand.      ADL: ADL Overall ADL's : Needs assistance/impaired Eating/Feeding: Set up;Supervision/ safety;Sitting Grooming: Minimal assistance;Sitting Upper Body Bathing: Moderate assistance;Sitting Lower Body Bathing: Moderate assistance;Bed level Upper Body Dressing : Moderate assistance;Sitting Lower Body Dressing: Maximal assistance;Bed level Toilet Transfer: Maximal assistance (sit to stand from bed) Toileting- Clothing Manipulation and Hygiene: Minimal assistance;Sitting/lateral lean Functional mobility during ADLs: Maximal assistance (sit to stand from bed) General ADL Comments: Educated to be careful with RUE as he has decreased sensation and to support RUE with pillow. Pt sat EOB and urinated in urinated. OT assisted in donning left sock.  Cognition: Cognition Overall Cognitive Status: Within Functional Limits for tasks assessed Orientation Level: Oriented X4 Cognition Arousal/Alertness:  Awake/alert Behavior During Therapy: WFL for tasks assessed/performed Overall Cognitive  Status: Within Functional Limits for tasks assessed  Blood pressure 136/65, pulse 77, temperature 98.8 F (37.1 C), temperature source Oral, resp. rate 18, height 6\' 1"  (1.854 m), weight 170 lb (77.111 kg), SpO2 97.00%. Physical Exam  Vitals reviewed. Eyes: EOM are normal.  Neck: Normal range of motion. Neck supple. No thyromegaly present.  Cardiovascular: Normal rate and regular rhythm.   Respiratory: Effort normal and breath sounds normal. No respiratory distress.  GI: Soft. Bowel sounds are normal. He exhibits no distension.  Neurological: He is alert.  Right-sided facial weakness. Speech is dysarthric but intelligible. He was oriented x3 and follows simple commands. RUE is trace deltoid/pec and 0/5 distally. RLE 4+ HR and KE. Left side normal. Sensation normal to PP and LT in all 4. Good insight and awareness  Skin: Skin is warm and dry.  Right BKA site is well-healed/shaped  Psychiatric: He has a normal mood and affect. His behavior is normal. Thought content normal.    Results for orders placed during the hospital encounter of 03/25/14 (from the past 24 hour(s))  URINE RAPID DRUG SCREEN (HOSP PERFORMED)     Status: None   Collection Time    03/25/14  9:20 AM      Result Value Ref Range   Opiates NONE DETECTED  NONE DETECTED   Cocaine NONE DETECTED  NONE DETECTED   Benzodiazepines NONE DETECTED  NONE DETECTED   Amphetamines NONE DETECTED  NONE DETECTED   Tetrahydrocannabinol NONE DETECTED  NONE DETECTED   Barbiturates NONE DETECTED  NONE DETECTED  URINALYSIS, ROUTINE W REFLEX MICROSCOPIC     Status: Abnormal   Collection Time    03/25/14  9:20 AM      Result Value Ref Range   Color, Urine YELLOW  YELLOW   APPearance CLOUDY (*) CLEAR   Specific Gravity, Urine 1.016  1.005 - 1.030   pH 6.5  5.0 - 8.0   Glucose, UA NEGATIVE  NEGATIVE mg/dL   Hgb urine dipstick TRACE (*) NEGATIVE    Bilirubin Urine NEGATIVE  NEGATIVE   Ketones, ur NEGATIVE  NEGATIVE mg/dL   Protein, ur 100 (*) NEGATIVE mg/dL   Urobilinogen, UA 0.2  0.0 - 1.0 mg/dL   Nitrite NEGATIVE  NEGATIVE   Leukocytes, UA MODERATE (*) NEGATIVE  URINE MICROSCOPIC-ADD ON     Status: Abnormal   Collection Time    03/25/14  9:20 AM      Result Value Ref Range   Squamous Epithelial / LPF RARE  RARE   WBC, UA 21-50  <3 WBC/hpf   RBC / HPF 0-2  <3 RBC/hpf   Bacteria, UA MANY (*) RARE  COMPREHENSIVE METABOLIC PANEL     Status: Abnormal   Collection Time    03/26/14  5:20 AM      Result Value Ref Range   Sodium 138  137 - 147 mEq/L   Potassium 4.1  3.7 - 5.3 mEq/L   Chloride 104  96 - 112 mEq/L   CO2 21  19 - 32 mEq/L   Glucose, Bld 129 (*) 70 - 99 mg/dL   BUN 11  6 - 23 mg/dL   Creatinine, Ser 0.78  0.50 - 1.35 mg/dL   Calcium 8.6  8.4 - 10.5 mg/dL   Total Protein 7.0  6.0 - 8.3 g/dL   Albumin 3.1 (*) 3.5 - 5.2 g/dL   AST 15  0 - 37 U/L   ALT 12  0 - 53 U/L   Alkaline Phosphatase 61  39 - 117 U/L   Total Bilirubin 0.5  0.3 - 1.2 mg/dL   GFR calc non Af Amer >90  >90 mL/min   GFR calc Af Amer >90  >90 mL/min  CBC     Status: None   Collection Time    03/26/14  5:20 AM      Result Value Ref Range   WBC 10.1  4.0 - 10.5 K/uL   RBC 4.77  4.22 - 5.81 MIL/uL   Hemoglobin 15.1  13.0 - 17.0 g/dL   HCT 43.5  39.0 - 52.0 %   MCV 91.2  78.0 - 100.0 fL   MCH 31.7  26.0 - 34.0 pg   MCHC 34.7  30.0 - 36.0 g/dL   RDW 15.0  11.5 - 15.5 %   Platelets 194  150 - 400 K/uL   Ct Head Wo Contrast  03/25/2014   CLINICAL DATA:  Right-sided weakness and facial droop.  EXAM: CT HEAD WITHOUT CONTRAST  TECHNIQUE: Contiguous axial images were obtained from the base of the skull through the vertex without intravenous contrast.  COMPARISON:  None.  FINDINGS: Brain does not show accelerated atrophy. There is no evidence of old or acute focal infarction, mass lesion, hemorrhage, hydrocephalus or extra-axial collection. There is  extensive atherosclerotic calcification of the major vessels at the base of the brain, most notably the right vertebral artery and both cavernous carotid arteries. The calvarium is unremarkable. There is mild mucosal inflammatory change of the paranasal sinuses.  IMPRESSION: Normal appearance of the brain itself. No evidence of acute infarction. Extensive atherosclerotic calcification of the major vessels the base of the brain.   Electronically Signed   By: Nelson Chimes M.D.   On: 03/25/2014 07:59   Mr [name] Head Wo Contrast  03/25/2014   CLINICAL DATA:  Right-sided weakness.  Hypertension.  Tobacco use.  EXAM: MRI HEAD WITHOUT CONTRAST  MRA HEAD WITHOUT CONTRAST  TECHNIQUE: Multiplanar, multiecho pulse sequences of the brain and surrounding structures were obtained without intravenous contrast. Angiographic images of the head were obtained using MRA technique without contrast.  COMPARISON:  03/25/2014 CT.  No comparison MR.  FINDINGS: MRI HEAD FINDINGS  Acute nonhemorrhagic left paracentral pontine infarct.  Punctate and patchy white matter type changes most suggestive of result of small vessel disease in this hypertensive smoker.  No intracranial hemorrhage.  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  Abnormal appearance left vertebral artery.  Please see below.  Mild paranasal sinus mucosal thickening.  Cervical medullary junction, pituitary region, pineal region and orbital structures unremarkable.  MRA HEAD FINDINGS  Arterial venous malformation left hippocampus may have supply from the left posterior cerebral artery with drainage towards the vein of Galen. Tiny associated aneurysm not excluded.  Left vertebral artery is occluded.  There is a prominent vessel arising from the base of the basilar artery which may represent a combined left posterior inferior cerebellar artery and left anterior inferior cerebellar artery.  Marked irregularity right vertebral artery with  narrowing and fusiform dilation. Ulceration not excluded.  Small vessel at the base of the basilar artery directed towards right may represent combined right posterior inferior cerebellar artery and right anterior inferior cerebellar artery.  Irregularity and mild narrowing of the a ectatic basilar artery.  Marked narrowing left posterior cerebral artery P2 segment. Mild to moderate narrowing right posterior cerebral artery P2 segment.  Ectatic irregular and slightly narrowed aspects of the pre cavernous and cavernous segment  of the internal carotid artery bilaterally. Mild narrowing clinoid segment of the left anterior cerebral artery.  Mild narrowing irregularity portions of the posterior cerebral artery bilaterally.  IMPRESSION: MRI HEAD:  Acute nonhemorrhagic left paracentral pontine infarct.  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  White matter type changes probably related to result of small vessel disease.  MRA HEAD:  Arterial venous malformation left hippocampus may have supply from the left posterior cerebral artery with drainage towards the vein of Galen. Tiny associated aneurysm not excluded.  Left vertebral artery is occluded.  Intracranial atherosclerotic type changes otherwise as detailed above.  These results will be called to the ordering clinician or representative by the Radiologist Assistant, and communication documented in the PACS Dashboard.   Electronically Signed   By: Chauncey Cruel M.D.   On: 03/25/2014 12:52   Mr Brain Wo Contrast  03/25/2014   CLINICAL DATA:  Right-sided weakness.  Hypertension.  Tobacco use.  EXAM: MRI HEAD WITHOUT CONTRAST  MRA HEAD WITHOUT CONTRAST  TECHNIQUE: Multiplanar, multiecho pulse sequences of the brain and surrounding structures were obtained without intravenous contrast. Angiographic images of the head were obtained using MRA technique without contrast.  COMPARISON:  03/25/2014 CT.  No comparison MR.  FINDINGS: MRI  HEAD FINDINGS  Acute nonhemorrhagic left paracentral pontine infarct.  Punctate and patchy white matter type changes most suggestive of result of small vessel disease in this hypertensive smoker.  No intracranial hemorrhage.  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  Abnormal appearance left vertebral artery.  Please see below.  Mild paranasal sinus mucosal thickening.  Cervical medullary junction, pituitary region, pineal region and orbital structures unremarkable.  MRA HEAD FINDINGS  Arterial venous malformation left hippocampus may have supply from the left posterior cerebral artery with drainage towards the vein of Galen. Tiny associated aneurysm not excluded.  Left vertebral artery is occluded.  There is a prominent vessel arising from the base of the basilar artery which may represent a combined left posterior inferior cerebellar artery and left anterior inferior cerebellar artery.  Marked irregularity right vertebral artery with narrowing and fusiform dilation. Ulceration not excluded.  Small vessel at the base of the basilar artery directed towards right may represent combined right posterior inferior cerebellar artery and right anterior inferior cerebellar artery.  Irregularity and mild narrowing of the a ectatic basilar artery.  Marked narrowing left posterior cerebral artery P2 segment. Mild to moderate narrowing right posterior cerebral artery P2 segment.  Ectatic irregular and slightly narrowed aspects of the pre cavernous and cavernous segment of the internal carotid artery bilaterally. Mild narrowing clinoid segment of the left anterior cerebral artery.  Mild narrowing irregularity portions of the posterior cerebral artery bilaterally.  IMPRESSION: MRI HEAD:  Acute nonhemorrhagic left paracentral pontine infarct.  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  White matter type changes probably related to result of small  vessel disease.  MRA HEAD:  Arterial venous malformation left hippocampus may have supply from the left posterior cerebral artery with drainage towards the vein of Galen. Tiny associated aneurysm not excluded.  Left vertebral artery is occluded.  Intracranial atherosclerotic type changes otherwise as detailed above.  These results will be called to the ordering clinician or representative by the Radiologist Assistant, and communication documented in the PACS Dashboard.   Electronically Signed   By: Chauncey Cruel M.D.   On:  03/25/2014 12:52    Assessment/Plan: Diagnosis: left pontine infarct 1. Does the need for close, 24 hr/day medical supervision in concert with the patient's rehab needs make it unreasonable for this patient to be served in a less intensive setting? Yes 2. Co-Morbidities requiring supervision/potential complications: right BKA 3. Due to bladder management, bowel management, safety, skin/wound care, disease management, medication administration, pain management and patient education, does the patient require 24 hr/day rehab nursing? Yes 4. Does the patient require coordinated care of a physician, rehab nurse, PT (1-2 hrs/day, 5 days/week), OT (1-2 hrs/day, 5 days/week) and SLP (1 hrs/day, 5 days/week) to address physical and functional deficits in the context of the above medical diagnosis(es)? Yes Addressing deficits in the following areas: balance, endurance, locomotion, strength, transferring, bowel/bladder control, bathing, dressing, feeding, grooming, toileting, speech and psychosocial support 5. Can the patient actively participate in an intensive therapy program of at least 3 hrs of therapy per day at least 5 days per week? Yes 6. The potential for patient to make measurable gains while on inpatient rehab is excellent 7. Anticipated functional outcomes upon discharge from inpatient rehab are modified independent  with PT, modified independent and supervision with OT, modified  independent with SLP. 8. Estimated rehab length of stay to reach the above functional goals is: 14-18 days 9. Does the patient have adequate social supports to accommodate these discharge functional goals? Yes 10. Anticipated D/C setting: Home 11. Anticipated post D/C treatments: HH therapy and Outpatient therapy 12. Overall Rehab/Functional Prognosis: excellent  RECOMMENDATIONS: This patient's condition is appropriate for continued rehabilitative care in the following setting: CIR Patient has agreed to participate in recommended program. Yes Note that insurance prior authorization may be required for reimbursement for recommended care.  Comment: Rehab Admissions Coordinator to follow up.  Thanks,  Meredith Staggers, MD, Mellody Drown     03/26/2014

## 2014-03-26 NOTE — Progress Notes (Signed)
Stroke Team Progress Note  HISTORY Jordan Arellano is an 63 y.o. male who went to sleep last night 03/24/2014 feeling normal at 9:30 PM. He awoke at 0330 am and noted that his left arm was weak but he was able to move it antigravity. As the morning has progressed his right arm has become flaccid. In addition he noted his left arm has decreased sensation from shoulder to hand and left leg from knee below is weak. He denies any sensation changes in his left leg or face. He admits to smoking ~pack per day. He does not take ASA. NIHSS 7, 3 for RUE, 2 for face, 1 for dysarthria 1 for facial sensation. Patient was not administered TPA secondary to delay in arrival. He was admitted for further evaluation and treatment.  SUBJECTIVE No family is at the bedside.  Overall he feels his condition is unchanged.   OBJECTIVE Most recent Vital Signs: Filed Vitals:   03/25/14 2058 03/26/14 0010 03/26/14 0502 03/26/14 1000  BP: 153/69 159/86 136/65 153/80  Pulse: 64 70 77 73  Temp: 98.2 F (36.8 C) 98.3 F (36.8 C) 98.8 F (37.1 C) 97.8 F (36.6 C)  TempSrc: Oral Oral Oral Oral  Resp: 18 18 18 16   Height:      Weight:      SpO2: 100% 96% 97% 95%   CBG (last 3)  No results found for this basename: GLUCAP,  in the last 72 hours  IV Fluid Intake:   . sodium chloride 1,000 mL (03/26/14 0350)    MEDICATIONS  . aspirin  325 mg Oral Daily  . heparin  5,000 Units Subcutaneous 3 times per day  . nicotine  14 mg Transdermal Daily  . sodium chloride  3 mL Intravenous Q12H   PRN:  acetaminophen, acetaminophen, morphine injection  Diet:  Cardiac thin liquids Activity:  As tolerated DVT Prophylaxis:  Heparin 5000 units sq tid   CLINICALLY SIGNIFICANT STUDIES Basic Metabolic Panel:   Recent Labs Lab 03/25/14 0740 03/26/14 0520  NA 135* 138  K 4.4 4.1  CL 100 104  CO2 20 21  GLUCOSE 171* 129*  BUN 14 11  CREATININE 0.82 0.78  CALCIUM 9.2 8.6   Liver Function Tests:   Recent Labs Lab  03/25/14 0740 03/26/14 0520  AST 19 15  ALT 15 12  ALKPHOS 70 61  BILITOT 0.3 0.5  PROT 7.5 7.0  ALBUMIN 3.3* 3.1*   CBC:   Recent Labs Lab 03/25/14 0740 03/26/14 0520  WBC 9.2 10.1  NEUTROABS 6.0  --   HGB 15.6 15.1  HCT 45.8 43.5  MCV 91.8 91.2  PLT 208 194   Coagulation:   Recent Labs Lab 03/25/14 0740  LABPROT 13.1  INR 1.01   Cardiac Enzymes: No results found for this basename: CKTOTAL, CKMB, CKMBINDEX, TROPONINI,  in the last 168 hours Urinalysis:   Recent Labs Lab 03/25/14 0920  COLORURINE YELLOW  LABSPEC 1.016  PHURINE 6.5  GLUCOSEU NEGATIVE  HGBUR TRACE*  BILIRUBINUR NEGATIVE  KETONESUR NEGATIVE  PROTEINUR 100*  UROBILINOGEN 0.2  NITRITE NEGATIVE  LEUKOCYTESUR MODERATE*   Lipid Panel No results found for this basename: chol,  trig,  hdl,  cholhdl,  vldl,  ldlcalc   HgbA1C  No results found for this basename: HGBA1C    Urine Drug Screen:     Component Value Date/Time   LABOPIA NONE DETECTED 03/25/2014 0920   COCAINSCRNUR NONE DETECTED 03/25/2014 0920   LABBENZ NONE DETECTED 03/25/2014 0920   AMPHETMU  NONE DETECTED 03/25/2014 0920   THCU NONE DETECTED 03/25/2014 0920   LABBARB NONE DETECTED 03/25/2014 0920    Alcohol Level: No results found for this basename: ETH,  in the last 168 hours   CT of the brain  03/25/2014    Normal appearance of the brain itself. No evidence of acute infarction. Extensive atherosclerotic calcification of the major vessels the base of the brain.     MRI of the brain  03/25/2014   Acute nonhemorrhagic left paracentral pontine infarct.  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  White matter type changes probably related to result of small vessel disease.    MRA of the brain  03/25/2014    Arterial venous malformation left hippocampus may have supply from the left posterior cerebral artery with drainage towards the vein of Galen. Tiny associated aneurysm not excluded.  Left vertebral  artery is occluded.  Intracranial atherosclerotic type changes otherwise.    2D Echocardiogram  EF 55-60% with no source of embolus.   Carotid Doppler  No evidence of hemodynamically significant internal carotid artery stenosis. Vertebral artery flow is antegrade.   EKG  normal sinus rhythm. For complete results please see formal report.   Therapy Recommendations CIR  Physical Exam   Middle aged Caucasian male not in distress.Awake alert. Afebrile. Head is nontraumatic. Neck is supple without bruit. Hearing is normal. Cardiac exam no murmur or gallop. Lungs are clear to auscultation. Distal pulses are well felt. Neurological Exam :  Awake alert oriented x3 no aphasia. Minimum dysarthria. Extraocular movements full range with only few beats of nystagmus on end gaze. Visual acuity and fields appear normal. Mild right lower facial weakness. Tongue midline. Right upper extremity flaccid with 0/5 strength. Her right leg and rotation below knee but no obvious weakness. Normal strength on the left. Sensation is intact. Right plantar cannot be tested and left is downgoing. Coordination is normal on the left and cannot be tested on the right. Gait was not tested. ASSESSMENT [name] is a 63 y.o. male presenting with right sided paralysis and facial droop. Imaging confirms a left paracentral pontine infarct. Infarct felt to be thrombotic secondary to small vessel disease.  On no antithrombotics prior to admission. Now on aspirin 325 mg orally every day for secondary stroke prevention. Patient with resultant right hemiplegia, dysarthria. Stroke work up underway.  hypertension Hx TIA R BKA Cigarette smoker  LDL pending  Hospital day # 1  TREATMENT/PLAN  Continue aspirin 325 mg orally every day for secondary stroke prevention.  Rehab consult  F/u lipids, HgbA1c  ST to assess swallow  Burnetta Sabin, MSN, RN, ANVP-BC, AGPCNP-BC [facility] Pager: [phone] 03/26/2014  10:46 AM  I have personally obtained a history, examined the patient, evaluated imaging results, and formulated the assessment and plan of care. I agree with the above.  Antony Contras, MD  To contact Stroke Continuity provider, please refer to [email or web address] After hours, contact General Neurology

## 2014-03-26 NOTE — Progress Notes (Signed)
[facility] PROGRESS NOTE  Jordan Arellano EPP:[id number] DOB: 1951/04/25 DOA: 03/25/2014 PCP: No PCP Per Patient  Assessment/Plan: Suspected CVA  1. CT neg 2. MRI pos for acute nonhemorrhagic L paracentral pontine infarct 3. 2d echo unremarkable 4. B carotids without stenosis 5. On 325mg  ASA 6. Neuro following 7. Consult PT/OT/SLP with recs for CIR HTN  1. Allowing permissive htn per above 2. Hold bp med Tobacco abuse  1. Nicotine patch DVT prophylaxis  1. Heparin subQ  Code Status: Full Family Communication: Pt in room (indicate person spoken with, relationship, and if by phone, the number) Disposition Plan: Pending placement   Consultants:  Neurology  CIR  Antibiotics:  none (indicate start date, and stop date if known)  HPI/Subjective: No acute events noted overnight.  Objective: Filed Vitals:   03/25/14 2058 03/26/14 0010 03/26/14 0502 03/26/14 1000  BP: 153/69 159/86 136/65 153/80  Pulse: 64 70 77 73  Temp: 98.2 F (36.8 C) 98.3 F (36.8 C) 98.8 F (37.1 C) 97.8 F (36.6 C)  TempSrc: Oral Oral Oral Oral  Resp: 18 18 18 16   Height:      Weight:      SpO2: 100% 96% 97% 95%    Intake/Output Summary (Last 24 hours) at 03/26/14 1148 Last data filed at 03/26/14 0900  Gross per 24 hour  Intake 826.75 ml  Output    550 ml  Net 276.75 ml   Filed Weights   03/25/14 0722  Weight: 77.111 kg (170 lb)    Exam:   General:  Awake, in nad  Cardiovascular: regular, s1, s2  Respiratory: normal resp effort, no wheezing  Abdomen: soft, nondistended  Musculoskeletal: perfused, no clubbing  Neuro: flacid RUE, 4/5 strength in RLE, 5/5 elsewhere   Data Reviewed: Basic Metabolic Panel:  Recent Labs Lab 03/25/14 0740 03/26/14 0520  NA 135* 138  K 4.4 4.1  CL 100 104  CO2 20 21  GLUCOSE 171* 129*  BUN 14 11  CREATININE 0.82 0.78  CALCIUM 9.2 8.6   Liver Function Tests:  Recent Labs Lab 03/25/14 0740 03/26/14 0520  AST 19 15  ALT  15 12  ALKPHOS 70 61  BILITOT 0.3 0.5  PROT 7.5 7.0  ALBUMIN 3.3* 3.1*   No results found for this basename: LIPASE, AMYLASE,  in the last 168 hours No results found for this basename: AMMONIA,  in the last 168 hours CBC:  Recent Labs Lab 03/25/14 0740 03/26/14 0520  WBC 9.2 10.1  NEUTROABS 6.0  --   HGB 15.6 15.1  HCT 45.8 43.5  MCV 91.8 91.2  PLT 208 194   Cardiac Enzymes: No results found for this basename: CKTOTAL, CKMB, CKMBINDEX, TROPONINI,  in the last 168 hours BNP (last 3 results) No results found for this basename: PROBNP,  in the last 8760 hours CBG: No results found for this basename: GLUCAP,  in the last 168 hours  No results found for this or any previous visit (from the past 240 hour(s)).   Studies: Ct Head Wo Contrast  03/25/2014   CLINICAL DATA:  Right-sided weakness and facial droop.  EXAM: CT HEAD WITHOUT CONTRAST  TECHNIQUE: Contiguous axial images were obtained from the base of the skull through the vertex without intravenous contrast.  COMPARISON:  None.  FINDINGS: Brain does not show accelerated atrophy. There is no evidence of old or acute focal infarction, mass lesion, hemorrhage, hydrocephalus or extra-axial collection. There is extensive atherosclerotic calcification of the major vessels at the base of  the brain, most notably the right vertebral artery and both cavernous carotid arteries. The calvarium is unremarkable. There is mild mucosal inflammatory change of the paranasal sinuses.  IMPRESSION: Normal appearance of the brain itself. No evidence of acute infarction. Extensive atherosclerotic calcification of the major vessels the base of the brain.   Electronically Signed   By: Nelson Chimes M.D.   On: 03/25/2014 07:59   Mr [name] Head Wo Contrast  03/25/2014   CLINICAL DATA:  Right-sided weakness.  Hypertension.  Tobacco use.  EXAM: MRI HEAD WITHOUT CONTRAST  MRA HEAD WITHOUT CONTRAST  TECHNIQUE: Multiplanar, multiecho pulse sequences of the brain and  surrounding structures were obtained without intravenous contrast. Angiographic images of the head were obtained using MRA technique without contrast.  COMPARISON:  03/25/2014 CT.  No comparison MR.  FINDINGS: MRI HEAD FINDINGS  Acute nonhemorrhagic left paracentral pontine infarct.  Punctate and patchy white matter type changes most suggestive of result of small vessel disease in this hypertensive smoker.  No intracranial hemorrhage.  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  Abnormal appearance left vertebral artery.  Please see below.  Mild paranasal sinus mucosal thickening.  Cervical medullary junction, pituitary region, pineal region and orbital structures unremarkable.  MRA HEAD FINDINGS  Arterial venous malformation left hippocampus may have supply from the left posterior cerebral artery with drainage towards the vein of Galen. Tiny associated aneurysm not excluded.  Left vertebral artery is occluded.  There is a prominent vessel arising from the base of the basilar artery which may represent a combined left posterior inferior cerebellar artery and left anterior inferior cerebellar artery.  Marked irregularity right vertebral artery with narrowing and fusiform dilation. Ulceration not excluded.  Small vessel at the base of the basilar artery directed towards right may represent combined right posterior inferior cerebellar artery and right anterior inferior cerebellar artery.  Irregularity and mild narrowing of the a ectatic basilar artery.  Marked narrowing left posterior cerebral artery P2 segment. Mild to moderate narrowing right posterior cerebral artery P2 segment.  Ectatic irregular and slightly narrowed aspects of the pre cavernous and cavernous segment of the internal carotid artery bilaterally. Mild narrowing clinoid segment of the left anterior cerebral artery.  Mild narrowing irregularity portions of the posterior cerebral artery bilaterally.  IMPRESSION:  MRI HEAD:  Acute nonhemorrhagic left paracentral pontine infarct.  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  White matter type changes probably related to result of small vessel disease.  MRA HEAD:  Arterial venous malformation left hippocampus may have supply from the left posterior cerebral artery with drainage towards the vein of Galen. Tiny associated aneurysm not excluded.  Left vertebral artery is occluded.  Intracranial atherosclerotic type changes otherwise as detailed above.  These results will be called to the ordering clinician or representative by the Radiologist Assistant, and communication documented in the PACS Dashboard.   Electronically Signed   By: Chauncey Cruel M.D.   On: 03/25/2014 12:52   Mr Brain Wo Contrast  03/25/2014   CLINICAL DATA:  Right-sided weakness.  Hypertension.  Tobacco use.  EXAM: MRI HEAD WITHOUT CONTRAST  MRA HEAD WITHOUT CONTRAST  TECHNIQUE: Multiplanar, multiecho pulse sequences of the brain and surrounding structures were obtained without intravenous contrast. Angiographic images of the head were obtained using MRA technique without contrast.  COMPARISON:  03/25/2014 CT.  No comparison MR.  FINDINGS: MRI HEAD FINDINGS  Acute nonhemorrhagic left paracentral  pontine infarct.  Punctate and patchy white matter type changes most suggestive of result of small vessel disease in this hypertensive smoker.  No intracranial hemorrhage.  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  Abnormal appearance left vertebral artery.  Please see below.  Mild paranasal sinus mucosal thickening.  Cervical medullary junction, pituitary region, pineal region and orbital structures unremarkable.  MRA HEAD FINDINGS  Arterial venous malformation left hippocampus may have supply from the left posterior cerebral artery with drainage towards the vein of Galen. Tiny associated aneurysm not excluded.  Left vertebral artery is  occluded.  There is a prominent vessel arising from the base of the basilar artery which may represent a combined left posterior inferior cerebellar artery and left anterior inferior cerebellar artery.  Marked irregularity right vertebral artery with narrowing and fusiform dilation. Ulceration not excluded.  Small vessel at the base of the basilar artery directed towards right may represent combined right posterior inferior cerebellar artery and right anterior inferior cerebellar artery.  Irregularity and mild narrowing of the a ectatic basilar artery.  Marked narrowing left posterior cerebral artery P2 segment. Mild to moderate narrowing right posterior cerebral artery P2 segment.  Ectatic irregular and slightly narrowed aspects of the pre cavernous and cavernous segment of the internal carotid artery bilaterally. Mild narrowing clinoid segment of the left anterior cerebral artery.  Mild narrowing irregularity portions of the posterior cerebral artery bilaterally.  IMPRESSION: MRI HEAD:  Acute nonhemorrhagic left paracentral pontine infarct.  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  White matter type changes probably related to result of small vessel disease.  MRA HEAD:  Arterial venous malformation left hippocampus may have supply from the left posterior cerebral artery with drainage towards the vein of Galen. Tiny associated aneurysm not excluded.  Left vertebral artery is occluded.  Intracranial atherosclerotic type changes otherwise as detailed above.  These results will be called to the ordering clinician or representative by the Radiologist Assistant, and communication documented in the PACS Dashboard.   Electronically Signed   By: Chauncey Cruel M.D.   On: 03/25/2014 12:52    Scheduled Meds: . aspirin  325 mg Oral Daily  . heparin  5,000 Units Subcutaneous 3 times per day  . nicotine  14 mg Transdermal Daily  . sodium chloride  3 mL Intravenous Q12H    Continuous Infusions: . sodium chloride 1,000 mL (03/26/14 0350)    Principal Problem:   CVA (cerebral infarction) Active Problems:   HTN (hypertension)   Tobacco abuse  Time spent: 58min  Stephen K Chiu  [facility] Pager [phone]. If 7PM-7AM, please contact night-coverage at [email or web address], password [facility] 03/26/2014, 11:48 AM  LOS: 1 day

## 2014-03-26 NOTE — Progress Notes (Signed)
Speech Language Pathology Treatment: Dysphagia  Patient Details Name: Jordan Arellano MRN: [id number] DOB: Oct 29, 1951 Today's Date: 03/26/2014 Time: 7255-0016 SLP Time Calculation (min): 13 min  Assessment / Plan / Recommendation Clinical Impression  SLP provided assistance with repositioning for safe PO intake prior to providing trials of Dys 3 textures and thin liquids via straw. Pt consumed PO with no overt s/s of aspiration and did not require cueing. Today he has no subjective c/o dysphagia. No further f/u for swallowing is recommended at this time. Continue to recommend SLP cognitive assessment.   HPI HPI: Pt admitted 4/20 with acute nonhemorrhagic left paracentral pontine infarct, Arterial venous malformation left hippocampus.   Pertinent Vitals N/A  SLP Plan  All goals met;Other (Comment) (recommend SLE)    Recommendations Diet recommendations: Regular;Thin liquid Liquids provided via: Cup;Straw Medication Administration: Whole meds with liquid Supervision: Patient able to self feed;Intermittent supervision to cue for compensatory strategies (set-up assist PRN) Compensations: Slow rate;Small sips/bites Postural Changes and/or Swallow Maneuvers: Seated upright 90 degrees;Upright 30-60 min after meal              Oral Care Recommendations: Oral care BID Follow up Recommendations: Other (comment);None (none recommended for swallowing; recommend SLE to further assess cognitive function) Plan: All goals met;Other (Comment) (recommend SLE)    GO      Germain Osgood, M.A. CCC-SLP [phone]  Germain Osgood 03/26/2014, 10:36 AM

## 2014-03-27 LAB — LIPID PANEL
Cholesterol: 252 mg/dL — ABNORMAL HIGH (ref 0–200)
HDL: 25 mg/dL — AB (ref >39)
LDL CALC: UNDETERMINED mg/dL (ref 0–99)
Total CHOL/HDL Ratio: 10.1 RATIO
Triglycerides: 616 mg/dL — ABNORMAL HIGH (ref <150)
VLDL: UNDETERMINED mg/dL (ref 0–40)

## 2014-03-27 MED ORDER — ATORVASTATIN CALCIUM 20 MG PO TABS
20.0000 mg | ORAL_TABLET | Freq: Every day | ORAL | Status: DC
  Administered 2014-03-27: 20 mg via ORAL
  Filled 2014-03-27 (×2): qty 1

## 2014-03-27 NOTE — Discharge Summary (Signed)
Physician Discharge Summary  Jordan Arellano [id number] DOB: [birth year] DOA: 03/25/2014  PCP: No PCP Per Patient  Admit date: 03/25/2014 Discharge date: 03/28/2014  Time spent: 45 minutes  Recommendations for Outpatient Follow-up:  Patient will be discharged to inpatient rehabilitation. He should continue physical therapy as well as occupational therapy as recommended by inpatient rehabilitation. Patient should continue his medications as prescribed. He will need followup with his primary care physician upon discharge from inpatient rehabilitation, as well as Dr. Leonie Man in 2 months.  Discharge Diagnoses:  Acute CVA Hypertension Tobacco abuse Hyperlipidemia  Discharge Condition: Stable  Diet recommendation: Heart healthy  Filed Weights   03/25/14 0722  Weight: 77.111 kg (170 lb)    History of present illness:  Jordan Arellano is a 63 y.o. male with a hx of hypertension and tobacco abuse who presents to the ED with Right sided weakness upon awakening.The patient recalled feeling normal when going to bed the night prior. In the ED, the patient was found to have an unremarkable head CT. Neurology was consulted and the Hospitalist consulted for admission.  Hospital Course:  Acute CVA -CT of the head; Normal appearance of the brain itself.  -MRI of the brain: Acute nonhemorrhagic left paracentral pontine infarct.  -MRA of head: AVM left hippocampus  -Echocardiogram: EF 0000000, grade 1 diastolic dysfunction  -Carotid Doppler: Unremarkable  -Lipid panel: 252/616/25/LDL unable to calculate  -Hemoglobin A1c 6.2  -Neurology consulted and following  -Continue aspirin 325 mg for secondary stroke prevention  -Continue atorvastatin  -PT/OT recommended inpatient rehabilitation  -Patient will be discharged to inpatient rehabilitation  Hypertension  -Currently controlled  -May continue benicar at discharge  Tobacco abuse  -Continue nicotine patch  -Smoking cessation discussed with  patient   Hyperlipidemia  -Patient started on statin, will need outpatient monitoring.   Procedures: Echocardiogram  Study Conclusions Left ventricle: Poor acoustic windows prohibit accurate measurements. The cavity size was normal. Systolic function was normal. The estimated ejection fraction was in the range of 55% to 60%. Doppler parameters are consistent with abnormal left ventricular relaxation (grade 1 diastolic dysfunction).   Carotid Doppler: Mild mixed plaque in bifurcations and proximal ICA without ICA stenosis, (1-39%) bilaterally. Right vertebral artery flow antegrade, Left vertebral artery not insonated.  Consultations: Neurology CIR  Discharge Exam: Filed Vitals:   03/28/14 1016  BP: 107/71  Pulse: 80  Temp: 98 F (36.7 C)  Resp: 20   Exam  General: Well developed, well nourished, NAD, appears stated age  HEENT: NCAT, PERRLA, EOMI, Anicteic Sclera, mucous membranes moist.  Neck: Supple, no JVD, no masses  Cardiovascular: S1 S2 auscultated, no rubs, murmurs or gallops. Regular rate and rhythm.  Respiratory: Clear to auscultation bilaterally with equal chest rise  Abdomen: Soft, nontender, nondistended, + bowel sounds  Extremities: warm dry without cyanosis clubbing or edema in LLE, RLE BKA, RUE flaccid Neuro: AAOx3, cranial nerves grossly intact. Strength 4/5 in patient's lower extremities bilaterally and LUE, right upper extremity flaccid  Skin: Without rashes exudates or nodules  Psych: Normal affect and demeanor with intact judgement and insight  Discharge Instructions      Discharge Orders   Future Orders Complete By Expires   Diet - low sodium heart healthy  As directed    Discharge instructions  As directed    Increase activity slowly  As directed        Medication List         aspirin 325 MG tablet  Take 1 tablet (325 mg total)  by mouth daily.     atorvastatin 20 MG tablet  Commonly known as:  LIPITOR  Take 1 tablet (20 mg total) by mouth  daily at 6 PM.     nicotine 14 mg/24hr patch  Commonly known as:  NICODERM CQ - dosed in mg/24 hours  Place 1 patch (14 mg total) onto the skin daily.     olmesartan 40 MG tablet  Commonly known as:  BENICAR  Take 40 mg by mouth daily.       No Known Allergies Follow-up Information   Follow up with Forbes Cellar, MD. Schedule an appointment as soon as possible for a visit in 2 months. (Stroke Clinic)    Specialties:  Neurology, Radiology   Contact information:   [address] [place] [address] [phone]        The results of significant diagnostics from this hospitalization (including imaging, microbiology, ancillary and laboratory) are listed below for reference.    Significant Diagnostic Studies: Ct Head Wo Contrast  03/25/2014   CLINICAL DATA:  Right-sided weakness and facial droop.  EXAM: CT HEAD WITHOUT CONTRAST  TECHNIQUE: Contiguous axial images were obtained from the base of the skull through the vertex without intravenous contrast.  COMPARISON:  None.  FINDINGS: Brain does not show accelerated atrophy. There is no evidence of old or acute focal infarction, mass lesion, hemorrhage, hydrocephalus or extra-axial collection. There is extensive atherosclerotic calcification of the major vessels at the base of the brain, most notably the right vertebral artery and both cavernous carotid arteries. The calvarium is unremarkable. There is mild mucosal inflammatory change of the paranasal sinuses.  IMPRESSION: Normal appearance of the brain itself. No evidence of acute infarction. Extensive atherosclerotic calcification of the major vessels the base of the brain.   Electronically Signed   By: Nelson Chimes M.D.   On: 03/25/2014 07:59   Mr [name] Head Wo Contrast  03/25/2014   CLINICAL DATA:  Right-sided weakness.  Hypertension.  Tobacco use.  EXAM: MRI HEAD WITHOUT CONTRAST  MRA HEAD WITHOUT CONTRAST  TECHNIQUE: Multiplanar, multiecho pulse sequences of the brain  and surrounding structures were obtained without intravenous contrast. Angiographic images of the head were obtained using MRA technique without contrast.  COMPARISON:  03/25/2014 CT.  No comparison MR.  FINDINGS: MRI HEAD FINDINGS  Acute nonhemorrhagic left paracentral pontine infarct.  Punctate and patchy white matter type changes most suggestive of result of small vessel disease in this hypertensive smoker.  No intracranial hemorrhage.  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  Abnormal appearance left vertebral artery.  Please see below.  Mild paranasal sinus mucosal thickening.  Cervical medullary junction, pituitary region, pineal region and orbital structures unremarkable.  MRA HEAD FINDINGS  Arterial venous malformation left hippocampus may have supply from the left posterior cerebral artery with drainage towards the vein of Galen. Tiny associated aneurysm not excluded.  Left vertebral artery is occluded.  There is a prominent vessel arising from the base of the basilar artery which may represent a combined left posterior inferior cerebellar artery and left anterior inferior cerebellar artery.  Marked irregularity right vertebral artery with narrowing and fusiform dilation. Ulceration not excluded.  Small vessel at the base of the basilar artery directed towards right may represent combined right posterior inferior cerebellar artery and right anterior inferior cerebellar artery.  Irregularity and mild narrowing of the a ectatic basilar artery.  Marked narrowing left posterior cerebral artery P2 segment.  Mild to moderate narrowing right posterior cerebral artery P2 segment.  Ectatic irregular and slightly narrowed aspects of the pre cavernous and cavernous segment of the internal carotid artery bilaterally. Mild narrowing clinoid segment of the left anterior cerebral artery.  Mild narrowing irregularity portions of the posterior cerebral artery bilaterally.   IMPRESSION: MRI HEAD:  Acute nonhemorrhagic left paracentral pontine infarct.  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  White matter type changes probably related to result of small vessel disease.  MRA HEAD:  Arterial venous malformation left hippocampus may have supply from the left posterior cerebral artery with drainage towards the vein of Galen. Tiny associated aneurysm not excluded.  Left vertebral artery is occluded.  Intracranial atherosclerotic type changes otherwise as detailed above.  These results will be called to the ordering clinician or representative by the Radiologist Assistant, and communication documented in the PACS Dashboard.   Electronically Signed   By: Chauncey Cruel M.D.   On: 03/25/2014 12:52   Mr Brain Wo Contrast  03/25/2014   CLINICAL DATA:  Right-sided weakness.  Hypertension.  Tobacco use.  EXAM: MRI HEAD WITHOUT CONTRAST  MRA HEAD WITHOUT CONTRAST  TECHNIQUE: Multiplanar, multiecho pulse sequences of the brain and surrounding structures were obtained without intravenous contrast. Angiographic images of the head were obtained using MRA technique without contrast.  COMPARISON:  03/25/2014 CT.  No comparison MR.  FINDINGS: MRI HEAD FINDINGS  Acute nonhemorrhagic left paracentral pontine infarct.  Punctate and patchy white matter type changes most suggestive of result of small vessel disease in this hypertensive smoker.  No intracranial hemorrhage.  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  Abnormal appearance left vertebral artery.  Please see below.  Mild paranasal sinus mucosal thickening.  Cervical medullary junction, pituitary region, pineal region and orbital structures unremarkable.  MRA HEAD FINDINGS  Arterial venous malformation left hippocampus may have supply from the left posterior cerebral artery with drainage towards the vein of Galen. Tiny associated aneurysm not excluded.  Left vertebral  artery is occluded.  There is a prominent vessel arising from the base of the basilar artery which may represent a combined left posterior inferior cerebellar artery and left anterior inferior cerebellar artery.  Marked irregularity right vertebral artery with narrowing and fusiform dilation. Ulceration not excluded.  Small vessel at the base of the basilar artery directed towards right may represent combined right posterior inferior cerebellar artery and right anterior inferior cerebellar artery.  Irregularity and mild narrowing of the a ectatic basilar artery.  Marked narrowing left posterior cerebral artery P2 segment. Mild to moderate narrowing right posterior cerebral artery P2 segment.  Ectatic irregular and slightly narrowed aspects of the pre cavernous and cavernous segment of the internal carotid artery bilaterally. Mild narrowing clinoid segment of the left anterior cerebral artery.  Mild narrowing irregularity portions of the posterior cerebral artery bilaterally.  IMPRESSION: MRI HEAD:  Acute nonhemorrhagic left paracentral pontine infarct.  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  White matter type changes probably related to result of small vessel disease.  MRA HEAD:  Arterial venous malformation left hippocampus may have supply from the left posterior cerebral artery with drainage towards the vein of Galen. Tiny associated aneurysm not excluded.  Left vertebral artery is occluded.  Intracranial atherosclerotic type changes otherwise as detailed above.  These results will be called to the ordering clinician or representative by  the Radiologist Assistant, and communication documented in the PACS Dashboard.   Electronically Signed   By: Chauncey Cruel M.D.   On: 03/25/2014 12:52    Microbiology: No results found for this or any previous visit (from the past 240 hour(s)).   Labs: Basic Metabolic Panel:  Recent Labs Lab 03/25/14 0740 03/26/14 0520  NA  135* 138  K 4.4 4.1  CL 100 104  CO2 20 21  GLUCOSE 171* 129*  BUN 14 11  CREATININE 0.82 0.78  CALCIUM 9.2 8.6   Liver Function Tests:  Recent Labs Lab 03/25/14 0740 03/26/14 0520  AST 19 15  ALT 15 12  ALKPHOS 70 61  BILITOT 0.3 0.5  PROT 7.5 7.0  ALBUMIN 3.3* 3.1*   No results found for this basename: LIPASE, AMYLASE,  in the last 168 hours No results found for this basename: AMMONIA,  in the last 168 hours CBC:  Recent Labs Lab 03/25/14 0740 03/26/14 0520  WBC 9.2 10.1  NEUTROABS 6.0  --   HGB 15.6 15.1  HCT 45.8 43.5  MCV 91.8 91.2  PLT 208 194   Cardiac Enzymes: No results found for this basename: CKTOTAL, CKMB, CKMBINDEX, TROPONINI,  in the last 168 hours BNP: BNP (last 3 results) No results found for this basename: PROBNP,  in the last 8760 hours CBG: No results found for this basename: GLUCAP,  in the last 168 hours     Signed:  Cristal Ford  [facility] 03/28/2014, 10:57 AM

## 2014-03-27 NOTE — Progress Notes (Signed)
Stroke Team Progress Note  HISTORY Jordan Arellano is an 63 y.o. male who went to sleep last night 03/24/2014 feeling normal at 9:30 PM. He awoke at 0330 am and noted that his left arm was weak but he was able to move it antigravity. As the morning has progressed his right arm has become flaccid. In addition he noted his left arm has decreased sensation from shoulder to hand and left leg from knee below is weak. He denies any sensation changes in his left leg or face. He admits to smoking ~pack per day. He does not take ASA. NIHSS 7, 3 for RUE, 2 for face, 1 for dysarthria 1 for facial sensation. Patient was not administered TPA secondary to delay in arrival. He was admitted for further evaluation and treatment.  SUBJECTIVE Patient on edge of bed working with therapy  OBJECTIVE Most recent Vital Signs: Filed Vitals:   03/26/14 2000 03/26/14 2225 03/27/14 0206 03/27/14 0546  BP:  143/78 154/79 132/73  Pulse:  63 78 64  Temp:  97.8 F (36.6 C) 97.7 F (36.5 C) 97.7 F (36.5 C)  TempSrc:  Oral Oral Oral  Resp: 16 18 18 18   Height:      Weight:      SpO2:  99% 99% 98%   CBG (last 3)  No results found for this basename: GLUCAP,  in the last 72 hours  IV Fluid Intake:   . sodium chloride 1,000 mL (03/26/14 0350)    MEDICATIONS  . aspirin  325 mg Oral Daily  . heparin  5,000 Units Subcutaneous 3 times per day  . nicotine  14 mg Transdermal Daily  . sodium chloride  3 mL Intravenous Q12H   PRN:  acetaminophen, acetaminophen, morphine injection  Diet:  Cardiac thin liquids Activity:  As tolerated DVT Prophylaxis:  Heparin 5000 units sq tid   CLINICALLY SIGNIFICANT STUDIES Basic Metabolic Panel:   Recent Labs Lab 03/25/14 0740 03/26/14 0520  NA 135* 138  K 4.4 4.1  CL 100 104  CO2 20 21  GLUCOSE 171* 129*  BUN 14 11  CREATININE 0.82 0.78  CALCIUM 9.2 8.6   Liver Function Tests:   Recent Labs Lab 03/25/14 0740 03/26/14 0520  AST 19 15  ALT 15 12  ALKPHOS 70 61   BILITOT 0.3 0.5  PROT 7.5 7.0  ALBUMIN 3.3* 3.1*   CBC:   Recent Labs Lab 03/25/14 0740 03/26/14 0520  WBC 9.2 10.1  NEUTROABS 6.0  --   HGB 15.6 15.1  HCT 45.8 43.5  MCV 91.8 91.2  PLT 208 194   Coagulation:   Recent Labs Lab 03/25/14 0740  LABPROT 13.1  INR 1.01   Cardiac Enzymes: No results found for this basename: CKTOTAL, CKMB, CKMBINDEX, TROPONINI,  in the last 168 hours Urinalysis:   Recent Labs Lab 03/25/14 0920  COLORURINE YELLOW  LABSPEC 1.016  PHURINE 6.5  GLUCOSEU NEGATIVE  HGBUR TRACE*  BILIRUBINUR NEGATIVE  KETONESUR NEGATIVE  PROTEINUR 100*  UROBILINOGEN 0.2  NITRITE NEGATIVE  LEUKOCYTESUR MODERATE*   Lipid Panel     Component Value Date/Time   CHOL 252* 03/27/2014 0536   TRIG 616* 03/27/2014 0536   HDL 25* 03/27/2014 0536   CHOLHDL 10.1 03/27/2014 0536   VLDL UNABLE TO CALCULATE IF TRIGLYCERIDE OVER 400 mg/dL 03/27/2014 0536   LDLCALC UNABLE TO CALCULATE IF TRIGLYCERIDE OVER 400 mg/dL 03/27/2014 0536    HgbA1C  Lab Results  Component Value Date   HGBA1C 6.2* 03/26/2014  Urine Drug Screen:     Component Value Date/Time   LABOPIA NONE DETECTED 03/25/2014 0920   COCAINSCRNUR NONE DETECTED 03/25/2014 0920   LABBENZ NONE DETECTED 03/25/2014 0920   AMPHETMU NONE DETECTED 03/25/2014 0920   THCU NONE DETECTED 03/25/2014 0920   LABBARB NONE DETECTED 03/25/2014 0920    Alcohol Level: No results found for this basename: ETH,  in the last 168 hours   CT of the brain  03/25/2014    Normal appearance of the brain itself. No evidence of acute infarction. Extensive atherosclerotic calcification of the major vessels the base of the brain.     MRI of the brain  03/25/2014   Acute nonhemorrhagic left paracentral pontine infarct.  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  White matter type changes probably related to result of small vessel disease.    MRA of the brain  03/25/2014    Arterial venous malformation left  hippocampus may have supply from the left posterior cerebral artery with drainage towards the vein of Galen. Tiny associated aneurysm not excluded.  Left vertebral artery is occluded.  Intracranial atherosclerotic type changes otherwise.    2D Echocardiogram  EF 55-60% with no source of embolus.   Carotid Doppler  No evidence of hemodynamically significant internal carotid artery stenosis. Vertebral artery flow is antegrade.   EKG  normal sinus rhythm. For complete results please see formal report.   Therapy Recommendations CIR  Physical Exam   Middle aged Caucasian male not in distress.Awake alert. Afebrile. Head is nontraumatic. Neck is supple without bruit. Hearing is normal. Cardiac exam no murmur or gallop. Lungs are clear to auscultation. Distal pulses are well felt. Neurological Exam :  Awake alert oriented x3 no aphasia. Minimum dysarthria. Extraocular movements full range with only few beats of nystagmus on end gaze. Visual acuity and fields appear normal. Mild right lower facial weakness. Tongue midline. Right upper extremity flaccid with 0/5 strength. Her right leg and rotation below knee but no obvious weakness. Normal strength on the left. Sensation is intact. Right plantar cannot be tested and left is downgoing. Coordination is normal on the left and cannot be tested on the right. Gait was not tested.  ASSESSMENT [name] is a 63 y.o. male presenting with right sided paralysis and facial droop. Imaging confirms a left paracentral pontine infarct. Infarct felt to be thrombotic secondary to small vessel disease.  On no antithrombotics prior to admission. Now on aspirin 325 mg orally every day for secondary stroke prevention. Patient with resultant right hemiplegia, dysarthria. Stroke work up underway.  Hypertension Hyperlipidemia, LDL unable to calculate, on no statin PTA, now on no statin, goal LDL < 100 Hx TIA R BKA Cigarette smoker  Hospital day #  2  TREATMENT/PLAN  Continue aspirin 325 mg orally every day for secondary stroke prevention.  Rehab when bed available, insurance approval obtained  Add statin  No further stroke workup indicated.  Patient has a 10-15% risk of having another stroke over the next year, the highest risk is within 2 weeks of the most recent stroke/TIA (risk of having a stroke following a stroke or TIA is the same).  Ongoing risk factor control by Primary Care Physician  Stroke Service will sign off. Please call should any needs arise.  Follow up with Dr. Leonie Man, [facility], in 2 months.  Burnetta Sabin, MSN, RN, ANVP-BC, AGPCNP-BC [facility] Pager: [phone] 03/27/2014 8:52 AM  I have personally  obtained a history, examined the patient, evaluated imaging results, and formulated the assessment and plan of care. I agree with the above. Antony Contras, MD  To contact Stroke Continuity provider, please refer to [email or web address] After hours, contact General Neurology

## 2014-03-27 NOTE — Progress Notes (Signed)
Physical Therapy Treatment Patient Details Name: Jordan Arellano MRN: [id number] DOB: 1951-09-16 Today's Date: 03/27/2014    History of Present Illness 63 y.o. male admitted [facility]/14 with right sided weakness.  MRI revealed and acute nonhemorrhagic left paracentral pontine infarct, Arterial venous malformation left hippocampus. 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  Other significant PMHx of HTN.     PT Comments    Ready for CIR  Follow Up Recommendations  CIR     Equipment Recommendations  Rolling walker with 5" wheels;Wheelchair (measurements PT);Wheelchair cushion (measurements PT) (R PFRW)    Recommendations for Other Services Rehab consult     Precautions / Restrictions Precautions Precautions: Fall    Mobility  Bed Mobility Overal bed mobility: Needs Assistance Bed Mobility: Supine to Sit     Supine to sit: Min guard     General bed mobility comments: min assist to support trunk during transition to sit.   Transfers Overall transfer level: Needs assistance Equipment used: None (right leg prosthesis and R PFRW) Transfers: Sit to/from Stand Sit to Stand: Mod assist Stand pivot transfers: Mod assist       General transfer comment: Mod assist to help come forward and up.  R knee now stable enough to  maintain control without support  Ambulation/Gait Ambulation/Gait assistance: Min assist;+2 safety/equipment Ambulation Distance (Feet): 35 Feet Assistive device:  (R  PFRW) Gait Pattern/deviations: Step-to pattern;Decreased stride length     General Gait Details: PFRW made it much easier for pt to stay in control.   Stairs            Wheelchair Mobility    Modified Rankin (Stroke Patients Only) Modified Rankin (Stroke Patients Only) Pre-Morbid Rankin Score: Slight disability Modified Rankin: Moderately severe disability     Balance Overall balance assessment: Needs assistance Sitting-balance support: Feet supported;No upper  extremity supported Sitting balance-Leahy Scale: Good     Standing balance support: Bilateral upper extremity supported Standing balance-Leahy Scale: Poor                      Cognition Arousal/Alertness: Awake/alert Behavior During Therapy: WFL for tasks assessed/performed Overall Cognitive Status: Within Functional Limits for tasks assessed                      Exercises      General Comments General comments (skin integrity, edema, etc.): Donned most of his prosthesis without assist      Pertinent Vitals/Pain     Home Living                      Prior Function            PT Goals (current goals can now be found in the care plan section) Acute Rehab PT Goals Patient Stated Goal: to do what we think is best PT Goal Formulation: With patient Time For Goal Achievement: 04/09/14 Potential to Achieve Goals: Good Progress towards PT goals: Progressing toward goals    Frequency  Min 4X/week    PT Plan      Co-evaluation             End of Session Equipment Utilized During Treatment: Gait belt Activity Tolerance: Patient tolerated treatment well Patient left: in chair;with call bell/phone within reach;with chair alarm set     Time: 4098-1191 PT Time Calculation (min): 35 min  Charges:  $Gait Training: 8-22 mins $Therapeutic Activity: 8-22 mins  G CodesTessie Arellano Jordan Arellano 03/27/2014, 11:44 AM 03/27/2014  Jordan Arellano, Jordan Arellano [phone]  (pager)

## 2014-03-27 NOTE — Progress Notes (Signed)
Rehab admissions - We have authorization for acute inpatient rehab admission.  Will plan to admit to acute inpatient rehab in am.  Call me for questions.  #[phone]

## 2014-03-27 NOTE — Progress Notes (Signed)
[facility]                                                                              Patient Demographics  Jordan Arellano, is a 63 y.o. male, DOB - 1951/09/19, HGD:[id number]  Admit date - 03/25/2014   Admitting Physician Donne Hazel, MD  Outpatient Primary MD for the patient is No PCP Per Patient  LOS - 2   Chief Complaint  Patient presents with  . Stroke Symptoms        Assessment & Plan   Acute CVA -CT of the head; Normal appearance of the brain itself.  -MRI of the brain: Acute nonhemorrhagic left paracentral pontine infarct. -MRA of head: AVM left hippocampus -Echocardiogram: EF 96-22%, grade 1 diastolic dysfunction -Carotid Doppler: Unremarkable -Lipid panel: 252/616/25/LDL unable to calculate -Hemoglobin A1c 6.2 -Neurology consulted and following -Continue aspirin 325 mg for secondary stroke prevention -Continue atorvastatin -PT/OT recommended inpatient rehabilitation -Currently pending approval for CIR  Hypertension -Currently controlled -AntiHypertensive medications held due to CVA  Tobacco abuse -Continue nicotine patch -Smoking cessation discussed with patient  Hyperlipidemia -Patient started on statin, will need outpatient monitoring.  Code Status: Full  Family Communication: None at bedside  Disposition Plan: Admitted, pending CIR approval  Time Spent in minutes   30  minutes  Procedures  Echocardiogram Study Conclusions Left ventricle: Poor acoustic windows prohibit accurate measurements. The cavity size was normal. Systolic function was normal. The estimated ejection fraction was in the range of 55% to 60%. Doppler parameters are consistent with abnormal left ventricular relaxation (grade 1 diastolic dysfunction).   Carotid Doppler: Mild mixed plaque in bifurcations and proximal ICA without ICA stenosis, (1-39%) bilaterally. Right vertebral artery flow antegrade, Left vertebral artery not insonated.  Consults    Neurology CIR  DVT Prophylaxis  Heparin  Lab Results  Component Value Date   PLT 194 03/26/2014    Medications  Scheduled Meds: . aspirin  325 mg Oral Daily  . heparin  5,000 Units Subcutaneous 3 times per day  . nicotine  14 mg Transdermal Daily  . sodium chloride  3 mL Intravenous Q12H   Continuous Infusions: . sodium chloride 1,000 mL (03/26/14 0350)   PRN Meds:.acetaminophen, acetaminophen, morphine injection  Antibiotics    Anti-infectives   None      Subjective:   Jordan Arellano seen and examined today.  Patient currently has no complaints. He does state his  right arm does continue to feel numb. Denies any chest pain or shortness of breath at the time lately go to inpatient rehabilitation.   Objective:   Filed Vitals:   03/26/14 2000 03/26/14 2225 03/27/14 0206 03/27/14 0546  BP:  143/78 154/79 132/73  Pulse:  63 78 64  Temp:  97.8 F (36.6 C) 97.7 F (36.5 C) 97.7 F (36.5 C)  TempSrc:  Oral Oral Oral  Resp: 16 18 18 18   Height:      Weight:      SpO2:  99% 99% 98%    Wt Readings from Last 3 Encounters:  03/25/14 77.111 kg (170 lb)     Intake/Output Summary (Last 24 hours) at 03/27/14 0825 Last data filed at 03/27/14 0600  Gross per 24 hour  Intake    483 ml  Output    900 ml  Net   -417 ml    Exam  General: Well developed, well nourished, NAD, appears stated age  HEENT: NCAT, PERRLA, EOMI, Anicteic Sclera, mucous membranes moist.   Neck: Supple, no JVD, no masses  Cardiovascular: S1 S2 auscultated, no rubs, murmurs or gallops. Regular rate and rhythm.  Respiratory: Clear to auscultation bilaterally with equal chest rise  Abdomen: Soft, nontender, nondistended, + bowel sounds  Extremities: warm dry without cyanosis clubbing or edema in LLE, RLE BKA, RUE lack of  Neuro: AAOx3, cranial nerves grossly intact. Strength 4/5 in patient's lower extremities bilaterally and LUE, right upper extremity flaccid  Skin: Without rashes  exudates or nodules  Psych: Normal affect and demeanor with intact judgement and insight  Data Review   Micro Results No results found for this or any previous visit (from the past 240 hour(s)).  Radiology Reports Ct Head Wo Contrast  03/25/2014   CLINICAL DATA:  Right-sided weakness and facial droop.  EXAM: CT HEAD WITHOUT CONTRAST  TECHNIQUE: Contiguous axial images were obtained from the base of the skull through the vertex without intravenous contrast.  COMPARISON:  None.  FINDINGS: Brain does not show accelerated atrophy. There is no evidence of old or acute focal infarction, mass lesion, hemorrhage, hydrocephalus or extra-axial collection. There is extensive atherosclerotic calcification of the major vessels at the base of the brain, most notably the right vertebral artery and both cavernous carotid arteries. The calvarium is unremarkable. There is mild mucosal inflammatory change of the paranasal sinuses.  IMPRESSION: Normal appearance of the brain itself. No evidence of acute infarction. Extensive atherosclerotic calcification of the major vessels the base of the brain.   Electronically Signed   By: Nelson Chimes M.D.   On: 03/25/2014 07:59   Mr [name] Head Wo Contrast  03/25/2014   CLINICAL DATA:  Right-sided weakness.  Hypertension.  Tobacco use.  EXAM: MRI HEAD WITHOUT CONTRAST  MRA HEAD WITHOUT CONTRAST  TECHNIQUE: Multiplanar, multiecho pulse sequences of the brain and surrounding structures were obtained without intravenous contrast. Angiographic images of the head were obtained using MRA technique without contrast.  COMPARISON:  03/25/2014 CT.  No comparison MR.  FINDINGS: MRI HEAD FINDINGS  Acute nonhemorrhagic left paracentral pontine infarct.  Punctate and patchy white matter type changes most suggestive of result of small vessel disease in this hypertensive smoker.  No intracranial hemorrhage.  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  Abnormal  appearance left vertebral artery.  Please see below.  Mild paranasal sinus mucosal thickening.  Cervical medullary junction, pituitary region, pineal region and orbital structures unremarkable.  MRA HEAD FINDINGS  Arterial venous malformation left hippocampus may have supply from the left posterior cerebral artery with drainage towards the vein of Galen. Tiny associated aneurysm not excluded.  Left vertebral artery is occluded.  There is a prominent vessel arising from the base of the basilar artery which may represent a combined left posterior inferior cerebellar artery and left anterior inferior cerebellar artery.  Marked irregularity right vertebral artery with narrowing and fusiform dilation. Ulceration not excluded.  Small vessel at the base of the basilar artery directed towards right may represent combined right posterior inferior cerebellar artery and right anterior inferior cerebellar artery.  Irregularity and mild narrowing of the a ectatic basilar artery.  Marked narrowing left posterior cerebral artery P2 segment. Mild to moderate narrowing  right posterior cerebral artery P2 segment.  Ectatic irregular and slightly narrowed aspects of the pre cavernous and cavernous segment of the internal carotid artery bilaterally. Mild narrowing clinoid segment of the left anterior cerebral artery.  Mild narrowing irregularity portions of the posterior cerebral artery bilaterally.  IMPRESSION: MRI HEAD:  Acute nonhemorrhagic left paracentral pontine infarct.  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  White matter type changes probably related to result of small vessel disease.  MRA HEAD:  Arterial venous malformation left hippocampus may have supply from the left posterior cerebral artery with drainage towards the vein of Galen. Tiny associated aneurysm not excluded.  Left vertebral artery is occluded.  Intracranial atherosclerotic type changes otherwise as detailed above.   These results will be called to the ordering clinician or representative by the Radiologist Assistant, and communication documented in the PACS Dashboard.   Electronically Signed   By: Chauncey Cruel M.D.   On: 03/25/2014 12:52   Mr Brain Wo Contrast  03/25/2014   CLINICAL DATA:  Right-sided weakness.  Hypertension.  Tobacco use.  EXAM: MRI HEAD WITHOUT CONTRAST  MRA HEAD WITHOUT CONTRAST  TECHNIQUE: Multiplanar, multiecho pulse sequences of the brain and surrounding structures were obtained without intravenous contrast. Angiographic images of the head were obtained using MRA technique without contrast.  COMPARISON:  03/25/2014 CT.  No comparison MR.  FINDINGS: MRI HEAD FINDINGS  Acute nonhemorrhagic left paracentral pontine infarct.  Punctate and patchy white matter type changes most suggestive of result of small vessel disease in this hypertensive smoker.  No intracranial hemorrhage.  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  Abnormal appearance left vertebral artery.  Please see below.  Mild paranasal sinus mucosal thickening.  Cervical medullary junction, pituitary region, pineal region and orbital structures unremarkable.  MRA HEAD FINDINGS  Arterial venous malformation left hippocampus may have supply from the left posterior cerebral artery with drainage towards the vein of Galen. Tiny associated aneurysm not excluded.  Left vertebral artery is occluded.  There is a prominent vessel arising from the base of the basilar artery which may represent a combined left posterior inferior cerebellar artery and left anterior inferior cerebellar artery.  Marked irregularity right vertebral artery with narrowing and fusiform dilation. Ulceration not excluded.  Small vessel at the base of the basilar artery directed towards right may represent combined right posterior inferior cerebellar artery and right anterior inferior cerebellar artery.  Irregularity and mild narrowing of the a  ectatic basilar artery.  Marked narrowing left posterior cerebral artery P2 segment. Mild to moderate narrowing right posterior cerebral artery P2 segment.  Ectatic irregular and slightly narrowed aspects of the pre cavernous and cavernous segment of the internal carotid artery bilaterally. Mild narrowing clinoid segment of the left anterior cerebral artery.  Mild narrowing irregularity portions of the posterior cerebral artery bilaterally.  IMPRESSION: MRI HEAD:  Acute nonhemorrhagic left paracentral pontine infarct.  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  White matter type changes probably related to result of small vessel disease.  MRA HEAD:  Arterial venous malformation left hippocampus may have supply from the left posterior cerebral artery with drainage towards the vein of Galen. Tiny associated aneurysm not excluded.  Left vertebral artery is occluded.  Intracranial atherosclerotic type changes otherwise as detailed above.  These results will be called to the ordering clinician or representative by the Radiologist Assistant, and  communication documented in the PACS Dashboard.   Electronically Signed   By: Chauncey Cruel M.D.   On: 03/25/2014 12:52    CBC  Recent Labs Lab 03/25/14 0740 03/26/14 0520  WBC 9.2 10.1  HGB 15.6 15.1  HCT 45.8 43.5  PLT 208 194  MCV 91.8 91.2  MCH 31.3 31.7  MCHC 34.1 34.7  RDW 15.1 15.0  LYMPHSABS 2.3  --   MONOABS 0.5  --   EOSABS 0.3  --   BASOSABS 0.1  --     Chemistries   Recent Labs Lab 03/25/14 0740 03/26/14 0520  NA 135* 138  K 4.4 4.1  CL 100 104  CO2 20 21  GLUCOSE 171* 129*  BUN 14 11  CREATININE 0.82 0.78  CALCIUM 9.2 8.6  AST 19 15  ALT 15 12  ALKPHOS 70 61  BILITOT 0.3 0.5   ------------------------------------------------------------------------------------------------------------------ estimated creatinine clearance is 105.7 ml/min (by C-G formula based on Cr of  0.78). ------------------------------------------------------------------------------------------------------------------  Recent Labs  03/26/14 1130  HGBA1C 6.2*   ------------------------------------------------------------------------------------------------------------------  Recent Labs  03/27/14 0536  CHOL 252*  HDL 25*  LDLCALC UNABLE TO CALCULATE IF TRIGLYCERIDE OVER 400 mg/dL  TRIG 616*  CHOLHDL 10.1   ------------------------------------------------------------------------------------------------------------------ No results found for this basename: TSH, T4TOTAL, FREET3, T3FREE, THYROIDAB,  in the last 72 hours ------------------------------------------------------------------------------------------------------------------ No results found for this basename: VITAMINB12, FOLATE, FERRITIN, TIBC, IRON, RETICCTPCT,  in the last 72 hours  Coagulation profile  Recent Labs Lab 03/25/14 0740  INR 1.01    No results found for this basename: DDIMER,  in the last 72 hours  Cardiac Enzymes No results found for this basename: CK, CKMB, TROPONINI, MYOGLOBIN,  in the last 168 hours ------------------------------------------------------------------------------------------------------------------ No components found with this basename: POCBNP,     Blane Worthington D.O. on 03/27/2014 at 8:25 AM  Between 7am to 7pm - Pager - [phone]  After 7pm go to [email or web address] - password [email or web address]  And look for the night coverage person covering for me after hours  [facility] Office  [phone]

## 2014-03-27 NOTE — PMR Pre-admission (Signed)
PMR Admission Coordinator Pre-Admission Assessment  Patient: Jordan Arellano is an 63 y.o., male MRN: [id number] DOB: 06/03/1951 Height: 6' 1" (185.4 cm) Weight: 77.111 kg (170 lb)              Insurance Information HMO: Yes    PPO:       PCP:       IPA:       80/20:       OTHER:  Open access Plan 1 PRIMARY: [employer] medicare complete      Policy#: [id number]      Subscriber: Jordan Arellano CM Name: Jordan Arellano      Phone#: [phone]     Fax#:   Follow up will be with on site reviewer Jordan  Pre-Cert#: [id number]      Employer: Disabled Benefits:  Phone #: [phone]     Name: Jordan Arellano. Date: 12/06/13     Deduct: $0      Out of Pocket Max: $4900 (none met)        Life Max: unlimited CIR: $345/day for days 1-5       SNF: $0 days 1-20; $155 days 21-52; $0 days 53-100 (100 day visit max) Outpatient: no limits, based on med. necessity      Co-Pay: $40/visit Home Health: 100%      Co-Pay: none DME: 80%     Co-Pay: 20% Providers: in network  Emergency Contact Information Contact Information   Name Relation Home Work Mobile   Arellano,Jordan Wayne Son [phone]  [phone]     Current Medical History  Patient Admitting Diagnosis: L pontine CVA  History of Present Illness: Jordan Arellano is a 63 y.o. 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 Patient independent prior to admission living alone. Admitted 03/25/2014 with right-sided weakness and dysarthria. 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 Echocardiogram with ejection fraction 60% grade 1 diastolic dysfunction. Carotid Dopplers are pending. Patient did not receive TPA. Placed on aspirin for CVA prophylaxis with subcutaneous Septra for DVT prophylaxis. NicoDerm patch placed for  history of tobacco abuse. Tolerating a regular consistency diet. Occupational therapy evaluation completed 03/25/2014 with recommendations for physical medicine rehabilitation consult.  NIH Total: 5  Past Medical History  Past Medical History  Diagnosis Date  . Hypertension   . TIA (transient ischemic attack)   . Stroke     Family History  family history includes Hypertension in his father and mother.  Prior Rehab/Hospitalizations: had previous acute inpt rehab following his R BKA approx 10 years ago   Current Medications  Current facility-administered medications:0.9 %  sodium chloride infusion, , Intravenous, Continuous, Stephen K Chiu, MD, Last Rate: 75 mL/hr at 03/26/14 0350, 1,000 mL at 03/26/14 0350;  acetaminophen (TYLENOL) suppository 650 mg, 650 mg, Rectal, Q6H PRN, Stephen K Chiu, MD;  acetaminophen (TYLENOL) tablet 650 mg, 650 mg, Oral, Q6H PRN, Stephen K Chiu, MD;  aspirin tablet 325 mg, 325 mg, Oral, Daily, Stephen K Chiu, MD, 325 mg at 03/27/14 1051 atorvastatin (LIPITOR) tablet 20 mg, 20 mg, Oral, q1800, Sharon L Biby, NP, 20 mg at 03/27/14 1828;  heparin injection 5,000 Units, 5,000 Units, Subcutaneous, 3 times per day, Stephen K Chiu, MD, 5,000 Units at 03/28/14 0721;  morphine 2 MG/ML injection   2 mg, 2 mg, Intravenous, Q4H PRN, Donne Hazel, MD;  nicotine (NICODERM CQ - dosed in mg/24 hours) patch 14 mg, 14 mg, Transdermal, Daily, Donne Hazel, MD, 14 mg at 03/27/14 1052 sodium chloride 0.9 % injection 3 mL, 3 mL, Intravenous, Q12H, Donne Hazel, MD, 3 mL at 03/27/14 2308  Patients Current Diet: Cardiac  Precautions / Restrictions Precautions Precautions: Fall Precaution Comments: pt reports no recent h/o falls, but now with new CVA.   Restrictions Weight Bearing Restrictions: No   Prior Activity Level Community (5-7x/wk): Went out daily.  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  Home Assistive Devices / Equipment Home Assistive  Devices/Equipment: Wheelchair Home Equipment: Other (comment) (has a seat to use for shower)  Prior Functional Level Prior Function Level of Independence: Independent Comments: Does not use a cane or a walker, but does use his prosthesis with gait.    Current Functional Level Cognition  Overall Cognitive Status: Within Functional Limits for tasks assessed Orientation Level: Oriented X4    Extremity Assessment (includes Sensation/Coordination)          ADLs  Overall ADL's : Needs assistance/impaired Eating/Feeding: Set up;Supervision/ safety;Sitting Grooming: Minimal assistance;Sitting Upper Body Bathing: Moderate assistance;Sitting Lower Body Bathing: Moderate assistance;Bed level Upper Body Dressing : Moderate assistance;Sitting Lower Body Dressing: Maximal assistance;Bed level Toilet Transfer: Maximal assistance (sit to stand from bed) Toileting- Clothing Manipulation and Hygiene: Minimal assistance;Sitting/lateral lean Functional mobility during ADLs: Maximal assistance (sit to stand from bed) General ADL Comments: Educated to be careful with RUE as he has decreased sensation and to support RUE with pillow. Pt sat EOB and urinated in urinated. OT assisted in donning left sock.    Mobility  Overal bed mobility: Needs Assistance Bed Mobility: Supine to Sit Supine to sit: Min guard General bed mobility comments: min assist to support trunk during transition to sit.     Transfers  Overall transfer level: Needs assistance Equipment used: None (right leg prosthesis and R PFRW) Transfers: Sit to/from Stand Sit to Stand: Mod assist Stand pivot transfers: Mod assist General transfer comment: Mod assist to help come forward and up.  R knee now stable enough to  maintain control without support    Ambulation / Gait / Stairs / Wheelchair Mobility  Ambulation/Gait Ambulation/Gait assistance: Min assist;+2 safety/equipment Ambulation Distance (Feet): 35 Feet Assistive device:   (R  PFRW) Gait Pattern/deviations: Step-to pattern;Decreased stride length General Gait Details: PFRW made it much easier for pt to stay in control.    Posture / Balance      Special needs/care consideration BiPAP/CPAP no  CPM no Continuous Drip IV no Dialysis no         Life Vest no  Oxygen no  Special Bed no  Trach Size no  Wound Vac (area) no        Skin no                                Bowel mgmt: last BM on 03-26-14 Bladder mgmt: currently using urinal Diabetic mgmt no  Note: pt with hx of R BKA (approx 10 yrs ago) and was independent with prosthesis without AD prior to this new CVA   Previous Home Environment Living Arrangements: Alone Available Help at Discharge: Family;Available PRN/intermittently Type of Home: House Home Layout: One level Home Access: Stairs to enter Entrance Stairs-Rails:  (no rail in back; left  rail in front) Entrance Stairs-Number of Steps: 2 in back; 4 in front Bathroom Shower/Tub: Chiropodist: Standard Home Care Services: No Additional Comments: wears glasses to read, does not drive.    Discharge Living Setting Plans for Discharge Living Setting: Patient's home;Alone;House Type of Home at Discharge: House Discharge Home Layout: One level Discharge Home Access: Stairs to enter Entrance Stairs-Rails: None Entrance Stairs-Number of Steps: 1 step entry back door Does the patient have any problems obtaining your medications?: No  Social/Family/Support Systems Patient Roles: Parent (Has son, aunt/uncle and cousin.) Contact Information: Jaqwan Wieber - son [phone] Anticipated Caregiver: self Anticipated Caregiver's Contact Information: see emergency contacts Ability/Limitations of Caregiver: Has mod I goals.  Aunt and uncle live next door.  Cousin lives a few doors down. Caregiver Availability: Intermittent Discharge Plan Discussed with Primary Caregiver: Yes (noting goals are for mod. Indep and left voicemail update  with pt's son). Pt also stated his Aunt/Uncle who live next door could help if needed. Is Caregiver In Agreement with Plan?: Yes per son Does Caregiver/Family have Issues with Lodging/Transportation while Pt is in Rehab?: No  Goals/Additional Needs Patient/Family Goal for Rehab: PT/OT/ST mod I goals Expected length of stay: 14-18 days Cultural Considerations: None Dietary Needs: Heart diet, thin liquids Equipment Needs: TBD Pt/Family Agrees to Admission and willing to participate: Yes Program Orientation Provided & Reviewed with Pt/Caregiver Including Roles  & Responsibilities: Yes   Decrease burden of Care through IP rehab admission: NA  Possible need for SNF placement upon discharge: not anticipated  Patient Condition: This patient's condition remains as documented in the consult dated 03-26-14, in which the Rehabilitation Physician determined and documented that the patient's condition is appropriate for intensive rehabilitative care in an inpatient rehabilitation facility. Will admit to inpatient rehab today.  Preadmission Screen Completed By:  Ave Filter, PT, 03/28/2014 10:24 AM ______________________________________________________________________   Discussed status with Dr. Letta Pate on 03-28-14 at 1029 and received telephone approval for admission today.  Admission Coordinator:  Quentin Mulling Sony Schlarb, PT, time 1029/Date 03-28-14

## 2014-03-27 NOTE — Progress Notes (Signed)
Rehab admissions - I met with pt and explained the possibility of inpatient rehab. Pt had been to CIR approximately 10 years ago following his R BKA and was already familiar with the program. Pt was given contact information and informational brochures on our CVA program. He is interested in pursuing acute rehab now and I will now open his case with [facility] to seek their authorization.  I will keep the pt and medical team aware of any updates as I wait for insurance authorization. I updated Hassan Rowan case Freight forwarder and discussed pt's case with Yvone Neu, PT as well.  Please call me with any questions. Thanks.  Nanetta Batty, PT Rehabilitation Admissions Coordinator [phone]

## 2014-03-28 ENCOUNTER — Inpatient Hospital Stay (HOSPITAL_COMMUNITY)
Admission: RE | Admit: 2014-03-28 | Discharge: 2014-04-08 | DRG: 945 | Disposition: A | Discharge status: Home Health | Payer: Medicare Other | Source: Intra-hospital | Attending: Physical Medicine & Rehabilitation | Admitting: Physical Medicine & Rehabilitation

## 2014-03-28 ENCOUNTER — Encounter (HOSPITAL_COMMUNITY): Payer: Self-pay | Admitting: *Deleted

## 2014-03-28 DIAGNOSIS — Z602 Problems related to living alone: Secondary | ICD-10-CM | POA: Diagnosis not present

## 2014-03-28 DIAGNOSIS — K5900 Constipation, unspecified: Secondary | ICD-10-CM | POA: Diagnosis present

## 2014-03-28 DIAGNOSIS — Z8249 Family history of ischemic heart disease and other diseases of the circulatory system: Secondary | ICD-10-CM | POA: Diagnosis not present

## 2014-03-28 DIAGNOSIS — S88119A Complete traumatic amputation at level between knee and ankle, unspecified lower leg, initial encounter: Secondary | ICD-10-CM

## 2014-03-28 DIAGNOSIS — I6330 Cerebral infarction due to thrombosis of unspecified cerebral artery: Secondary | ICD-10-CM | POA: Diagnosis present

## 2014-03-28 DIAGNOSIS — R471 Dysarthria and anarthria: Secondary | ICD-10-CM | POA: Diagnosis present

## 2014-03-28 DIAGNOSIS — I10 Essential (primary) hypertension: Secondary | ICD-10-CM | POA: Diagnosis present

## 2014-03-28 DIAGNOSIS — G8110 Spastic hemiplegia affecting unspecified side: Secondary | ICD-10-CM

## 2014-03-28 DIAGNOSIS — Z5189 Encounter for other specified aftercare: Secondary | ICD-10-CM | POA: Diagnosis present

## 2014-03-28 DIAGNOSIS — G8190 Hemiplegia, unspecified affecting unspecified side: Secondary | ICD-10-CM | POA: Diagnosis present

## 2014-03-28 DIAGNOSIS — Z7982 Long term (current) use of aspirin: Secondary | ICD-10-CM

## 2014-03-28 DIAGNOSIS — Z79899 Other long term (current) drug therapy: Secondary | ICD-10-CM | POA: Diagnosis not present

## 2014-03-28 DIAGNOSIS — Z8673 Personal history of transient ischemic attack (TIA), and cerebral infarction without residual deficits: Secondary | ICD-10-CM | POA: Diagnosis not present

## 2014-03-28 DIAGNOSIS — Z720 Tobacco use: Secondary | ICD-10-CM

## 2014-03-28 DIAGNOSIS — I639 Cerebral infarction, unspecified: Secondary | ICD-10-CM

## 2014-03-28 DIAGNOSIS — F17200 Nicotine dependence, unspecified, uncomplicated: Secondary | ICD-10-CM | POA: Diagnosis present

## 2014-03-28 LAB — CBC
HEMATOCRIT: 43.5 % (ref 39.0–52.0)
HEMOGLOBIN: 15.3 g/dL (ref 13.0–17.0)
MCH: 31.9 pg (ref 26.0–34.0)
MCHC: 35.2 g/dL (ref 30.0–36.0)
MCV: 90.8 fL (ref 78.0–100.0)
Platelets: 261 10*3/uL (ref 150–400)
RBC: 4.79 MIL/uL (ref 4.22–5.81)
RDW: 14.9 % (ref 11.5–15.5)
WBC: 10.3 10*3/uL (ref 4.0–10.5)

## 2014-03-28 LAB — CREATININE, SERUM
Creatinine, Ser: 0.84 mg/dL (ref 0.50–1.35)
GFR calc Af Amer: >90 mL/min (ref >90)
GFR calc non Af Amer: >90 mL/min (ref >90)

## 2014-03-28 MED ORDER — ACETAMINOPHEN 325 MG PO TABS: 325.0000 mg | ORAL_TABLET | ORAL | Status: DC | PRN

## 2014-03-28 MED ORDER — NICOTINE 14 MG/24HR TD PT24: 14.0000 mg | MEDICATED_PATCH | Freq: Every day | TRANSDERMAL | Status: DC

## 2014-03-28 MED ORDER — SORBITOL 70 % SOLN
30.0000 mL | Freq: Every day | Status: DC | PRN
  Administered 2014-03-30 – 2014-04-04 (×3): 30 mL via ORAL
  Filled 2014-03-28 (×4): qty 30

## 2014-03-28 MED ORDER — ATORVASTATIN CALCIUM 20 MG PO TABS
20.0000 mg | ORAL_TABLET | Freq: Every day | ORAL | Status: DC
  Administered 2014-03-28 – 2014-04-07 (×11): 20 mg via ORAL
  Filled 2014-03-28 (×12): qty 1

## 2014-03-28 MED ORDER — HEPARIN SODIUM (PORCINE) 5000 UNIT/ML IJ SOLN: 5000.0000 [IU] | Freq: Three times a day (TID) | INTRAMUSCULAR | Status: DC

## 2014-03-28 MED ORDER — ATORVASTATIN CALCIUM 20 MG PO TABS: 20.0000 mg | ORAL_TABLET | Freq: Every day | ORAL | Status: DC

## 2014-03-28 MED ORDER — ASPIRIN 325 MG PO TABS
325.0000 mg | ORAL_TABLET | Freq: Every day | ORAL | Status: DC
  Administered 2014-03-29 – 2014-04-08 (×11): 325 mg via ORAL
  Filled 2014-03-28 (×12): qty 1

## 2014-03-28 MED ORDER — ASPIRIN 325 MG PO TABS: 325.0000 mg | ORAL_TABLET | Freq: Every day | ORAL | Status: DC

## 2014-03-28 MED ORDER — HEPARIN SODIUM (PORCINE) 5000 UNIT/ML IJ SOLN
5000.0000 [IU] | Freq: Three times a day (TID) | INTRAMUSCULAR | Status: DC
  Administered 2014-03-28 – 2014-04-04 (×20): 5000 [IU] via SUBCUTANEOUS
  Filled 2014-03-28 (×23): qty 1

## 2014-03-28 MED ORDER — NICOTINE 14 MG/24HR TD PT24
14.0000 mg | MEDICATED_PATCH | Freq: Every day | TRANSDERMAL | Status: DC
  Administered 2014-03-29 – 2014-04-08 (×11): 14 mg via TRANSDERMAL
  Filled 2014-03-28 (×13): qty 1

## 2014-03-28 MED ORDER — ONDANSETRON HCL 4 MG/2ML IJ SOLN: 4.0000 mg | Freq: Four times a day (QID) | INTRAMUSCULAR | Status: DC | PRN

## 2014-03-28 MED ORDER — ONDANSETRON HCL 4 MG PO TABS: 4.0000 mg | ORAL_TABLET | Freq: Four times a day (QID) | ORAL | Status: DC | PRN

## 2014-03-28 NOTE — H&P (Signed)
Physical Medicine and Rehabilitation Admission H&P  Chief Complaint   Patient presents with   .  Stroke Symptoms   :  Chief complaint: Right-sided weakness  HPI: Jordan Arellano is a 63 y.o. 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 Patient independent prior to admission living alone. Admitted 03/25/2014 with right-sided weakness and dysarthria. 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 Echocardiogram with ejection fraction 60% grade 1 diastolic dysfunction. Carotid Dopplers with no ICA stenosis. Patient did not receive TPA. Placed on aspirin for CVA prophylaxis with subcutaneous heparin for DVT prophylaxis. NicoDerm patch placed for history of tobacco abuse. Tolerating a regular consistency diet. Physical and Occupational therapy evaluations completed 03/25/2014 with recommendations for physical medicine rehabilitation consult. Patient was admitted for comprehensive rehabilitation program   Patient or his prosthesis today without difficulty  ROS Review of Systems  Gastrointestinal: Positive for constipation.  Musculoskeletal: Positive for myalgias.  Neurological: Positive for speech change and weakness.  All other systems reviewed and are negative  Past Medical History   Diagnosis  Date   .  Hypertension    .  TIA (transient ischemic attack)    .  Stroke     Past Surgical History   Procedure  Laterality  Date   .  Ambutation     .  Below knee leg amputation  Right     Family History   Problem  Relation  Age of Onset   .  Hypertension  Mother    .  Hypertension  Father     Social History: reports that he has been smoking Cigarettes. He has a 17.5 pack-year smoking history. He has never used smokeless tobacco. He reports that he  does not drink alcohol or use illicit drugs.  Allergies: No Known Allergies  Medications Prior to Admission   Medication  Sig  Dispense  Refill   .  olmesartan (BENICAR) 40 MG tablet  Take 40 mg by mouth daily.      Home:  Home Living  Family/patient expects to be discharged to:: Private residence  Living Arrangements: Alone  Available Help at Discharge: Family;Available PRN/intermittently  Type of Home: House  Home Access: Stairs to enter  [employer] of Steps: 2 in back; 4 in front  Entrance Stairs-Rails: (no rail in back; left rail in front)  Home Layout: One level  Home Equipment: Other (comment) (has a seat to use for shower)  Additional Comments: wears glasses to read, does not drive.  Functional History:  Prior Function  Level of Independence: Independent  Comments: Does not use a cane or a walker, but does use his prosthesis with gait.  Functional Status:  Mobility:  Bed Mobility  Overal bed mobility: Needs Assistance  Bed Mobility: Supine to Sit  Supine to sit: Min assist  General bed mobility comments: min assist to support trunk during transition to sit.  Transfers  Overall transfer level: Needs assistance  Equipment used: None (right leg prosthesis)  Transfers: Sit to/from [employer] Transfers  Sit to Stand: Mod assist  Stand pivot transfers: Mod assist  General transfer comment: mod assist to support trunk for balance and block his right knee for stability in standing. Mod assist to support trunk for balance and pt reaching  with his left upper extremity to the armrest of the recliner chair for stability.  Ambulation/Gait  General Gait Details: would be safer with R PFRW and second person assist.   ADL:   Cognition:  Cognition  Overall Cognitive Status: Within Functional Limits for tasks assessed  Orientation Level: Oriented X4  Cognition  Arousal/Alertness: Awake/alert  Behavior During Therapy: WFL for tasks assessed/performed  Overall  Cognitive Status: Within Functional Limits for tasks assessed  Physical Exam:  Blood pressure 132/73, pulse 64, temperature 97.7 F (36.5 C), temperature source Oral, resp. rate 18, height $RemoveBe'6\' 1"'gwNeXwRoi$  (1.854 m), weight 170 lb (77.111 kg), SpO2 98.00%.  Physical Exam  Vitals reviewed.  Eyes: EOM are normal.  Neck: Normal range of motion. Neck supple. No thyromegaly present.  Cardiovascular: Normal rate and regular rhythm.  Respiratory: Effort normal and breath sounds normal. No respiratory distress.  GI: Soft. Bowel sounds are normal. He exhibits no distension.  Neurological: He is alert.  Right-sided facial weakness. Speech is dysarthric but intelligible. He was oriented x3 and follows simple commands. RUE is trace deltoid/pec and 0/5 distally. RLE 4 HF and KE,Right hamstrings 3-/5 Left side normal. Sensation normal to  LT in BUEs, reduced at foot and ankle on Left. Good insight and awareness  Skin: Skin is warm and dry.  Right BKA site is well-healed/shaped  Sitting balance is fair supine to sit is with moderate assistance  Psychiatric: He has a normal mood and affect. His behavior is normal. Thought content normal  Results for orders placed during the hospital encounter of 03/25/14 (from the past 48 hour(s))   PROTIME-INR Status: None    Collection Time    03/25/14 7:40 AM   Result  Value  Ref Range    Prothrombin Time  13.1  11.6 - 15.2 seconds    INR  1.01  0.00 - 1.49   APTT Status: None    Collection Time    03/25/14 7:40 AM   Result  Value  Ref Range    aPTT  33  24 - 37 seconds   COMPREHENSIVE METABOLIC PANEL Status: Abnormal    Collection Time    03/25/14 7:40 AM   Result  Value  Ref Range    Sodium  135 (*)  137 - 147 mEq/L    Potassium  4.4  3.7 - 5.3 mEq/L    Chloride  100  96 - 112 mEq/L    CO2  20  19 - 32 mEq/L    Glucose, Bld  171 (*)  70 - 99 mg/dL    BUN  14  6 - 23 mg/dL    Creatinine, Ser  0.82  0.50 - 1.35 mg/dL    Calcium  9.2  8.4 - 10.5 mg/dL    Total  Protein  7.5  6.0 - 8.3 g/dL    Albumin  3.3 (*)  3.5 - 5.2 g/dL    AST  19  0 - 37 U/L    Comment:  HEMOLYSIS AT THIS LEVEL MAY AFFECT RESULT    ALT  15  0 - 53 U/L    Alkaline Phosphatase  70  39 - 117 U/L    Total Bilirubin  0.3  0.3 - 1.2 mg/dL    GFR calc non Af Amer  >90  >90 mL/min    GFR calc Af Amer  >90  >90 mL/min    Comment:  (NOTE)     The eGFR has been calculated using the CKD EPI  equation.     This calculation has not been validated in all clinical situations.     eGFR's persistently <90 mL/min signify possible Chronic Kidney     Disease.   CBC WITH DIFFERENTIAL Status: None    Collection Time    03/25/14 7:40 AM   Result  Value  Ref Range    WBC  9.2  4.0 - 10.5 K/uL    RBC  4.99  4.22 - 5.81 MIL/uL    Hemoglobin  15.6  13.0 - 17.0 g/dL    HCT  45.8  39.0 - 52.0 %    MCV  91.8  78.0 - 100.0 fL    MCH  31.3  26.0 - 34.0 pg    MCHC  34.1  30.0 - 36.0 g/dL    RDW  15.1  11.5 - 15.5 %    Platelets  208  150 - 400 K/uL    Neutrophils Relative %  64  43 - 77 %    Neutro Abs  6.0  1.7 - 7.7 K/uL    Lymphocytes Relative  25  12 - 46 %    Lymphs Abs  2.3  0.7 - 4.0 K/uL    Monocytes Relative  6  3 - 12 %    Monocytes Absolute  0.5  0.1 - 1.0 K/uL    Eosinophils Relative  4  0 - 5 %    Eosinophils Absolute  0.3  0.0 - 0.7 K/uL    Basophils Relative  1  0 - 1 %    Basophils Absolute  0.1  0.0 - 0.1 K/uL   I-STAT TROPOININ, ED Status: None    Collection Time    03/25/14 7:47 AM   Result  Value  Ref Range    Troponin i, poc  0.00  0.00 - 0.08 ng/mL    Comment 3      Comment:  Due to the release kinetics of cTnI,     a negative result within the first hours     of the onset of symptoms does not rule out     myocardial infarction with certainty.     If myocardial infarction is still suspected,     repeat the test at appropriate intervals.   URINE RAPID DRUG SCREEN (HOSP PERFORMED) Status: None    Collection Time    03/25/14 9:20 AM   Result  Value  Ref Range     Opiates  NONE DETECTED  NONE DETECTED    Cocaine  NONE DETECTED  NONE DETECTED    Benzodiazepines  NONE DETECTED  NONE DETECTED    Amphetamines  NONE DETECTED  NONE DETECTED    Tetrahydrocannabinol  NONE DETECTED  NONE DETECTED    Barbiturates  NONE DETECTED  NONE DETECTED    Comment:      DRUG SCREEN FOR MEDICAL PURPOSES     ONLY. IF CONFIRMATION IS NEEDED     FOR ANY PURPOSE, NOTIFY LAB     WITHIN 5 DAYS.         LOWEST DETECTABLE LIMITS     FOR URINE DRUG SCREEN     Drug Class Cutoff (ng/mL)     Amphetamine 1000     Barbiturate 200     Benzodiazepine 453     Tricyclics 646     Opiates 300     Cocaine 300     THC 50   URINALYSIS, ROUTINE W REFLEX MICROSCOPIC Status: Abnormal    Collection Time  03/25/14 9:20 AM   Result  Value  Ref Range    Color, Urine  YELLOW  YELLOW    APPearance  CLOUDY (*)  CLEAR    Specific Gravity, Urine  1.016  1.005 - 1.030    pH  6.5  5.0 - 8.0    Glucose, UA  NEGATIVE  NEGATIVE mg/dL    Hgb urine dipstick  TRACE (*)  NEGATIVE    Bilirubin Urine  NEGATIVE  NEGATIVE    Ketones, ur  NEGATIVE  NEGATIVE mg/dL    Protein, ur  100 (*)  NEGATIVE mg/dL    Urobilinogen, UA  0.2  0.0 - 1.0 mg/dL    Nitrite  NEGATIVE  NEGATIVE    Leukocytes, UA  MODERATE (*)  NEGATIVE   URINE MICROSCOPIC-ADD ON Status: Abnormal    Collection Time    03/25/14 9:20 AM   Result  Value  Ref Range    Squamous Epithelial / LPF  RARE  RARE    WBC, UA  21-50  <3 WBC/hpf    RBC / HPF  0-2  <3 RBC/hpf    Bacteria, UA  MANY (*)  RARE   COMPREHENSIVE METABOLIC PANEL Status: Abnormal    Collection Time    03/26/14 5:20 AM   Result  Value  Ref Range    Sodium  138  137 - 147 mEq/L    Potassium  4.1  3.7 - 5.3 mEq/L    Chloride  104  96 - 112 mEq/L    CO2  21  19 - 32 mEq/L    Glucose, Bld  129 (*)  70 - 99 mg/dL    BUN  11  6 - 23 mg/dL    Creatinine, Ser  0.78  0.50 - 1.35 mg/dL    Calcium  8.6  8.4 - 10.5 mg/dL    Total Protein  7.0  6.0 - 8.3 g/dL    Albumin  3.1  (*)  3.5 - 5.2 g/dL    AST  15  0 - 37 U/L    ALT  12  0 - 53 U/L    Alkaline Phosphatase  61  39 - 117 U/L    Total Bilirubin  0.5  0.3 - 1.2 mg/dL    GFR calc non Af Amer  >90  >90 mL/min    GFR calc Af Amer  >90  >90 mL/min    Comment:  (NOTE)     The eGFR has been calculated using the CKD EPI equation.     This calculation has not been validated in all clinical situations.     eGFR's persistently <90 mL/min signify possible Chronic Kidney     Disease.   CBC Status: None    Collection Time    03/26/14 5:20 AM   Result  Value  Ref Range    WBC  10.1  4.0 - 10.5 K/uL    RBC  4.77  4.22 - 5.81 MIL/uL    Hemoglobin  15.1  13.0 - 17.0 g/dL    HCT  43.5  39.0 - 52.0 %    MCV  91.2  78.0 - 100.0 fL    MCH  31.7  26.0 - 34.0 pg    MCHC  34.7  30.0 - 36.0 g/dL    RDW  15.0  11.5 - 15.5 %    Platelets  194  150 - 400 K/uL   HEMOGLOBIN A1C Status: Abnormal    Collection Time  03/26/14 11:30 AM   Result  Value  Ref Range    Hemoglobin A1C  6.2 (*)  <5.7 %    Comment:  (NOTE)         According to the ADA Clinical Practice Recommendations for 2011, when     HbA1c is used as a screening test:     >=6.5% Diagnostic of Diabetes Mellitus     (if abnormal result is confirmed)     5.7-6.4% Increased risk of developing Diabetes Mellitus     References:Diagnosis and Classification of Diabetes Mellitus,Diabetes     OEUM,3536,14(ERXVQ 1):S62-S69 and Standards of Medical Care in     Diabetes - 2011,Diabetes Care,2011,34 (Suppl 1):S11-S61.    Mean Plasma Glucose  131 (*)  <117 mg/dL    Comment:  Performed at [facility]: Abnormal    Collection Time    03/27/14 5:36 AM   Result  Value  Ref Range    Cholesterol  252 (*)  0 - 200 mg/dL    Triglycerides  616 (*)  <150 mg/dL    HDL  25 (*)  >39 mg/dL    Total CHOL/HDL Ratio  10.1     VLDL  UNABLE TO CALCULATE IF TRIGLYCERIDE OVER 400 mg/dL  0 - 40 mg/dL    LDL Cholesterol  UNABLE TO CALCULATE IF TRIGLYCERIDE  OVER 400 mg/dL  0 - 99 mg/dL    Comment:      Total Cholesterol/HDL:CHD Risk     Coronary Heart Disease Risk Table     Men Women     1/2 Average Risk 3.4 3.3     Average Risk 5.0 4.4     2 X Average Risk 9.6 7.1     3 X Average Risk 23.4 11.0         Use the calculated Patient Ratio     above and the CHD Risk Table     to determine the patient's CHD Risk.         ATP III CLASSIFICATION (LDL):     <100 mg/dL Optimal     100-129 mg/dL Near or Above     Optimal     130-159 mg/dL Borderline     160-189 mg/dL High     >190 mg/dL Very High    Ct Head Wo Contrast  03/25/2014 CLINICAL DATA: Right-sided weakness and facial droop. EXAM: CT HEAD WITHOUT CONTRAST TECHNIQUE: Contiguous axial images were obtained from the base of the skull through the vertex without intravenous contrast. COMPARISON: None. FINDINGS: Brain does not show accelerated atrophy. There is no evidence of old or acute focal infarction, mass lesion, hemorrhage, hydrocephalus or extra-axial collection. There is extensive atherosclerotic calcification of the major vessels at the base of the brain, most notably the right vertebral artery and both cavernous carotid arteries. The calvarium is unremarkable. There is mild mucosal inflammatory change of the paranasal sinuses. IMPRESSION: Normal appearance of the brain itself. No evidence of acute infarction. Extensive atherosclerotic calcification of the major vessels the base of the brain. Electronically Signed By: Nelson Chimes M.D. On: 03/25/2014 07:59  Mr [name] Head Wo Contrast  03/25/2014 CLINICAL DATA: Right-sided weakness. Hypertension. Tobacco use. EXAM: MRI HEAD WITHOUT CONTRAST MRA HEAD WITHOUT CONTRAST TECHNIQUE: Multiplanar, multiecho pulse sequences of the brain and surrounding structures were obtained without intravenous contrast. Angiographic images of the head were obtained using MRA technique without contrast. COMPARISON: 03/25/2014 CT. No comparison MR. FINDINGS: MRI HEAD  FINDINGS Acute nonhemorrhagic left  paracentral pontine infarct. Punctate and patchy white matter type changes most suggestive of result of small vessel disease in this hypertensive smoker. No intracranial hemorrhage. 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 Abnormal appearance left vertebral artery. Please see below. Mild paranasal sinus mucosal thickening. Cervical medullary junction, pituitary region, pineal region and orbital structures unremarkable. MRA HEAD FINDINGS Arterial venous malformation left hippocampus may have supply from the left posterior cerebral artery with drainage towards the vein of [place]. Tiny associated aneurysm not excluded. Left vertebral artery is occluded. There is a prominent vessel arising from the base of the basilar artery which may represent a combined left posterior inferior cerebellar artery and left anterior inferior cerebellar artery. Marked irregularity right vertebral artery with narrowing and fusiform dilation. Ulceration not excluded. Small vessel at the base of the basilar artery directed towards right may represent combined right posterior inferior cerebellar artery and right anterior inferior cerebellar artery. Irregularity and mild narrowing of the a ectatic basilar artery. Marked narrowing left posterior cerebral artery P2 segment. Mild to moderate narrowing right posterior cerebral artery P2 segment. Ectatic irregular and slightly narrowed aspects of the pre cavernous and cavernous segment of the internal carotid artery bilaterally. Mild narrowing clinoid segment of the left anterior cerebral artery. Mild narrowing irregularity portions of the posterior cerebral artery bilaterally. IMPRESSION: MRI HEAD: Acute nonhemorrhagic left paracentral pontine infarct. 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 White matter type changes probably related to result of small vessel disease. MRA HEAD:  Arterial venous malformation left hippocampus may have supply from the left posterior cerebral artery with drainage towards the vein of Galen. Tiny associated aneurysm not excluded. Left vertebral artery is occluded. Intracranial atherosclerotic type changes otherwise as detailed above. These results will be called to the ordering clinician or representative by the Radiologist Assistant, and communication documented in the PACS Dashboard. Electronically Signed By: Chauncey Cruel M.D. On: 03/25/2014 12:52  Mr Brain Wo Contrast  03/25/2014 CLINICAL DATA: Right-sided weakness. Hypertension. Tobacco use. EXAM: MRI HEAD WITHOUT CONTRAST MRA HEAD WITHOUT CONTRAST TECHNIQUE: Multiplanar, multiecho pulse sequences of the brain and surrounding structures were obtained without intravenous contrast. Angiographic images of the head were obtained using MRA technique without contrast. COMPARISON: 03/25/2014 CT. No comparison MR. FINDINGS: MRI HEAD FINDINGS Acute nonhemorrhagic left paracentral pontine infarct. Punctate and patchy white matter type changes most suggestive of result of small vessel disease in this hypertensive smoker. No intracranial hemorrhage. 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 Abnormal appearance left vertebral artery. Please see below. Mild paranasal sinus mucosal thickening. Cervical medullary junction, pituitary region, pineal region and orbital structures unremarkable. MRA HEAD FINDINGS Arterial venous malformation left hippocampus may have supply from the left posterior cerebral artery with drainage towards the vein of Galen. Tiny associated aneurysm not excluded. Left vertebral artery is occluded. There is a prominent vessel arising from the base of the basilar artery which may represent a combined left posterior inferior cerebellar artery and left anterior inferior cerebellar artery. Marked irregularity right vertebral artery with narrowing and fusiform  dilation. Ulceration not excluded. Small vessel at the base of the basilar artery directed towards right may represent combined right posterior inferior cerebellar artery and right anterior inferior cerebellar artery. Irregularity and mild narrowing of the a ectatic basilar artery. Marked narrowing left posterior cerebral artery P2 segment. Mild to moderate narrowing right posterior cerebral artery P2 segment. Ectatic irregular  and slightly narrowed aspects of the pre cavernous and cavernous segment of the internal carotid artery bilaterally. Mild narrowing clinoid segment of the left anterior cerebral artery. Mild narrowing irregularity portions of the posterior cerebral artery bilaterally. IMPRESSION: MRI HEAD: Acute nonhemorrhagic left paracentral pontine infarct. 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 White matter type changes probably related to result of small vessel disease. MRA HEAD: Arterial venous malformation left hippocampus may have supply from the left posterior cerebral artery with drainage towards the vein of Galen. Tiny associated aneurysm not excluded. Left vertebral artery is occluded. Intracranial atherosclerotic type changes otherwise as detailed above. These results will be called to the ordering clinician or representative by the Radiologist Assistant, and communication documented in the PACS Dashboard. Electronically Signed By: Chauncey Cruel M.D. On: 03/25/2014 12:52   Post Admission Physician Evaluation:  1. 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 2. Patient is admitted to receive collaborative, interdisciplinary care between the physiatrist, rehab nursing staff, and therapy team. 3. Patient's level of medical complexity and substantial therapy needs in context of that medical necessity cannot be provided at a lesser intensity of care such as a SNF. 4. Patient  has experienced substantial functional loss from his/her baseline which was documented above under the "Functional History" and "Functional Status" headings. Judging by the patient's diagnosis, physical exam, and functional history, the patient has potential for functional progress which will result in measurable gains while on inpatient rehab. These gains will be of substantial and practical use upon discharge in facilitating mobility and self-care at the household level. 5. Physiatrist will provide 24 hour management of medical needs as well as oversight of the therapy plan/treatment and provide guidance as appropriate regarding the interaction of the two. 6. 24 hour rehab nursing will assist with bladder management, bowel management, safety, skin/wound care, disease management, medication administration, pain management and patient education and help integrate therapy concepts, techniques,education, etc. 7. PT will assess and treat for/with: pre gait, gait training, endurance , safety, equipment, neuromuscular re education. Goals are:  supervision with walker to modified independent wheelchair mobility . 8. OT will assess and treat for/with: ADLs, Cognitive perceptual skills, Neuromuscular re education, safety, endurance, equipment. Goals are: sup ADLs. 9. SLP will assess and treat for/with: oro-motor deficits. Goals are: minimize dysarthria. 10. Case Management and Social Worker will assess and treat for psychological issues and discharge planning. 11. Team conference will be held weekly to assess progress toward goals and to determine barriers to discharge. 12. Patient will receive at least 3 hours of therapy per day at least 5 days per week. 13. ELOS: 12-16 days  14. Prognosis: good Medical Problem List and Plan:  1. Thrombotic left pontine infarct  2. DVT Prophylaxis/Anticoagulation: Subcutaneous heparin. Monitor platelet counts and any signs of bleeding  3. Pain Management: Tylenol as needed   4. 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 Patient with prosthesis provided by [employer]  5. Neuropsych: This patient is capable of making decisions on his own behalf.  6. Hypertension. No current antihypertensive medication. Patient on Benicar 40 mg daily prior to admission. Monitor with increased mobility  7. Tobacco abuse. NicoDerm patch. Provide counseling   Charlett Blake M.D. [facility] FAAPM&R (Sports Med, Neuromuscular Med) Diplomate Am Board of Electrodiagnostic Med   03/27/2014

## 2014-03-28 NOTE — Progress Notes (Signed)
Physical Therapy Treatment Patient Details Name: Jordan Arellano MRN: [id number] DOB: [birth year] Today's Date: 03/28/2014    History of Present Illness 63 y.o. male admitted to [facility] on 03/15/14 with right sided weakness.  MRI revealed and acute nonhemorrhagic left paracentral pontine infarct, Arterial venous malformation left hippocampus. 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  Other significant PMHx of HTN.     PT Comments    Emphasized bed mobility, donning prosthesis with little assistance, transfers and gait with prosthesis.  Ready for CIR  Follow Up Recommendations  CIR     Equipment Recommendations  Rolling walker with 5" wheels;Wheelchair (measurements PT);Wheelchair cushion (measurements PT) (TBA further on rehab)    Recommendations for Other Services Rehab consult     Precautions / Restrictions Precautions Precautions: Fall    Mobility  Bed Mobility Overal bed mobility: Needs Assistance Bed Mobility: Supine to Sit     Supine to sit: Min guard (up via L elbow with rail)     General bed mobility comments: use of rail, managed his own R UE after cuing  Transfers Overall transfer level: Needs assistance Equipment used:  (PFRW R) Transfers: Sit to/from Stand Sit to Stand: Min assist Stand pivot transfers: Min assist       General transfer comment: cues for best technique and help to come forward with a little lift  Ambulation/Gait Ambulation/Gait assistance: Min assist Ambulation Distance (Feet): 80 Feet Assistive device:  (PFRW) Gait Pattern/deviations: Step-through pattern (hemiparetic)     General Gait Details: more difficult to clear R prosthetic foot with shoes on due to not getting good toe off and decreased translating R hip forward.   Stairs            Wheelchair Mobility    Modified Rankin (Stroke Patients Only) Modified Rankin (Stroke Patients Only) Pre-Morbid Rankin Score: Slight disability Modified Rankin: Moderately severe  disability     Balance Overall balance assessment: Needs assistance Sitting-balance support: Feet supported;No upper extremity supported Sitting balance-Leahy Scale: Good     Standing balance support: During functional activity;Single extremity supported Standing balance-Leahy Scale: Poor (but able to briefly stand without UE assist)                      Cognition Arousal/Alertness: Awake/alert Behavior During Therapy: WFL for tasks assessed/performed Overall Cognitive Status: Within Functional Limits for tasks assessed                      Exercises      General Comments        Pertinent Vitals/Pain     Home Living                      Prior Function            PT Goals (current goals can now be found in the care plan section) Acute Rehab PT Goals PT Goal Formulation: With patient Time For Goal Achievement: 04/09/14 Potential to Achieve Goals: Good Progress towards PT goals: Progressing toward goals    Frequency  Min 4X/week    PT Plan Current plan remains appropriate    Co-evaluation             End of Session   Activity Tolerance: Patient tolerated treatment well Patient left: in chair;with call bell/phone within reach;with chair alarm set     Time: 5009-3818 PT Time Calculation (min): 18 min  Charges:  $Gait Training: 8-22 mins  G CodesTessie Fass Jordan Arellano 03/28/2014, 4:18 PM 03/28/2014  Jordan Arellano, [place] [phone]  (pager)

## 2014-03-28 NOTE — Plan of Care (Signed)
Problem: Acute Treatment Outcomes Goal: Neuro exam at baseline or improved Outcome: Progressing Patient right upper extremity is still completely flaccid but able to feel sensation.  Problem: Progression Outcomes Goal: Progressive activity as tolerated Outcome: Completed/Met Date Met:  03/28/14 Ambulates with PT  Problem: Discharge/Transitional Outcomes Goal: If vent dependent, trach in place Outcome: Not Applicable Date Met:  24/46/95 Patient is not vent dependent.

## 2014-03-28 NOTE — Progress Notes (Signed)
Rehab admissions - We received insurance authorization from [employer] and I spoke directly with Dr. Ree Kida who gave medical clearance for pt to come to CIR today. Bed is available and will admit pt later today to inpatient rehab. Completed paperwork with pt and left voicemail with pt's son. I updated Raquel Sarna, Education officer, museum and Emergency planning/management officer as well. Pt's RN also aware of plan.  Please call me with any questions. Thanks.  Nanetta Batty, PT Rehabilitation Admissions Coordinator [phone]

## 2014-03-28 NOTE — Progress Notes (Signed)
Received patient from 4N.  Alert and oriented x4; vitals obtained.  Patient denies pain.  Patient oriented to room and unit.  Will continue to monitor.

## 2014-03-29 ENCOUNTER — Inpatient Hospital Stay (HOSPITAL_COMMUNITY): Payer: Medicare Other | Admitting: Occupational Therapy

## 2014-03-29 ENCOUNTER — Inpatient Hospital Stay (HOSPITAL_COMMUNITY): Payer: Medicare Other | Admitting: Rehabilitation

## 2014-03-29 ENCOUNTER — Encounter (HOSPITAL_COMMUNITY): Payer: Medicare Other | Admitting: Occupational Therapy

## 2014-03-29 ENCOUNTER — Inpatient Hospital Stay (HOSPITAL_COMMUNITY): Payer: Medicare Other | Admitting: Speech Pathology

## 2014-03-29 LAB — CBC WITH DIFFERENTIAL/PLATELET
BASOS ABS: 0.1 10*3/uL (ref 0.0–0.1)
Basophils Relative: 1 % (ref 0–1)
EOS PCT: 3 % (ref 0–5)
Eosinophils Absolute: 0.3 10*3/uL (ref 0.0–0.7)
HCT: 44.4 % (ref 39.0–52.0)
Hemoglobin: 15.2 g/dL (ref 13.0–17.0)
LYMPHS PCT: 32 % (ref 12–46)
Lymphs Abs: 3.5 10*3/uL (ref 0.7–4.0)
MCH: 31.1 pg (ref 26.0–34.0)
MCHC: 34.2 g/dL (ref 30.0–36.0)
MCV: 91 fL (ref 78.0–100.0)
Monocytes Absolute: 0.8 10*3/uL (ref 0.1–1.0)
Monocytes Relative: 7 % (ref 3–12)
NEUTROS PCT: 57 % (ref 43–77)
Neutro Abs: 6.2 10*3/uL (ref 1.7–7.7)
PLATELETS: 240 10*3/uL (ref 150–400)
RBC: 4.88 MIL/uL (ref 4.22–5.81)
RDW: 14.8 % (ref 11.5–15.5)
WBC: 10.9 10*3/uL — AB (ref 4.0–10.5)

## 2014-03-29 LAB — COMPREHENSIVE METABOLIC PANEL
ALT: 14 U/L (ref 0–53)
AST: 16 U/L (ref 0–37)
Albumin: 3.2 g/dL — ABNORMAL LOW (ref 3.5–5.2)
Alkaline Phosphatase: 59 U/L (ref 39–117)
BUN: 22 mg/dL (ref 6–23)
CALCIUM: 9 mg/dL (ref 8.4–10.5)
CO2: 21 mEq/L (ref 19–32)
Chloride: 103 mEq/L (ref 96–112)
Creatinine, Ser: 0.87 mg/dL (ref 0.50–1.35)
GFR calc Af Amer: >90 mL/min (ref >90)
GFR calc non Af Amer: >90 mL/min (ref >90)
Glucose, Bld: 128 mg/dL — ABNORMAL HIGH (ref 70–99)
POTASSIUM: 3.9 meq/L (ref 3.7–5.3)
Sodium: 138 mEq/L (ref 137–147)
TOTAL PROTEIN: 7.2 g/dL (ref 6.0–8.3)
Total Bilirubin: 0.4 mg/dL (ref 0.3–1.2)

## 2014-03-29 NOTE — Progress Notes (Signed)
63 y.o. 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 Patient independent prior to admission living alone. Admitted 03/25/2014 with right-sided weakness and dysarthria. 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 Echocardiogram with ejection fraction 60% grade 1 diastolic dysfunction. Carotid Dopplers with no ICA stenosis.   Subjective/Complaints: Poor sleep , "thinking a lot" last noc No bowel or bladder issues Breathing ok No pain last noc although occ mild ache in R shoulder  Review of Systems - Negative except R arm weak  Objective: Vital Signs: Blood pressure 125/82, pulse 66, temperature 97.7 F (36.5 C), temperature source Oral, resp. rate 17, height 6\' 1"  (1.854 m), weight 75 kg (165 lb 5.5 oz), SpO2 96.00%. No results found. Results for orders placed during the hospital encounter of 03/28/14 (from the past 72 hour(s))  CBC     Status: None   Collection Time    03/28/14  9:40 PM      Result Value Ref Range   WBC 10.3  4.0 - 10.5 K/uL   RBC 4.79  4.22 - 5.81 MIL/uL   Hemoglobin 15.3  13.0 - 17.0 g/dL   HCT 03/30/14  27.3 - 99.5 %   MCV 90.8  78.0 - 100.0 fL   MCH 31.9  26.0 - 34.0 pg   MCHC 35.2  30.0 - 36.0 g/dL   RDW 91.6  19.8 - 82.2 %   Platelets 261  150 - 400 K/uL  CREATININE, SERUM     Status: None   Collection Time    03/28/14  9:40 PM      Result Value Ref Range   Creatinine, Ser 0.84  0.50 - 1.35 mg/dL   GFR calc non Af Amer >90  >90 mL/min   GFR calc Af Amer >90  >90 mL/min   Comment: (NOTE)     The eGFR has been calculated using the CKD EPI equation.     This calculation has not been validated in all clinical situations.     eGFR's persistently <90 mL/min signify possible Chronic Kidney     Disease.  CBC WITH  DIFFERENTIAL     Status: Abnormal   Collection Time    03/29/14  5:12 AM      Result Value Ref Range   WBC 10.9 (*) 4.0 - 10.5 K/uL   RBC 4.88  4.22 - 5.81 MIL/uL   Hemoglobin 15.2  13.0 - 17.0 g/dL   HCT 03/31/14  68.5 - 52.5 %   MCV 91.0  78.0 - 100.0 fL   MCH 31.1  26.0 - 34.0 pg   MCHC 34.2  30.0 - 36.0 g/dL   RDW 06.0  49.3 - 31.9 %   Platelets 240  150 - 400 K/uL   Neutrophils Relative % 57  43 - 77 %   Neutro Abs 6.2  1.7 - 7.7 K/uL   Lymphocytes Relative 32  12 - 46 %   Lymphs Abs 3.5  0.7 - 4.0 K/uL   Monocytes Relative 7  3 - 12 %   Monocytes Absolute 0.8  0.1 - 1.0 K/uL   Eosinophils Relative 3  0 - 5 %   Eosinophils Absolute 0.3  0.0 - 0.7 K/uL   Basophils Relative 1  0 -  1 %   Basophils Absolute 0.1  0.0 - 0.1 K/uL  COMPREHENSIVE METABOLIC PANEL     Status: Abnormal   Collection Time    03/29/14  5:12 AM      Result Value Ref Range   Sodium 138  137 - 147 mEq/L   Potassium 3.9  3.7 - 5.3 mEq/L   Chloride 103  96 - 112 mEq/L   CO2 21  19 - 32 mEq/L   Glucose, Bld 128 (*) 70 - 99 mg/dL   BUN 22  6 - 23 mg/dL   Creatinine, Ser 0.87  0.50 - 1.35 mg/dL   Calcium 9.0  8.4 - 10.5 mg/dL   Total Protein 7.2  6.0 - 8.3 g/dL   Albumin 3.2 (*) 3.5 - 5.2 g/dL   AST 16  0 - 37 U/L   ALT 14  0 - 53 U/L   Alkaline Phosphatase 59  39 - 117 U/L   Total Bilirubin 0.4  0.3 - 1.2 mg/dL   GFR calc non Af Amer >90  >90 mL/min   GFR calc Af Amer >90  >90 mL/min   Comment: (NOTE)     The eGFR has been calculated using the CKD EPI equation.     This calculation has not been validated in all clinical situations.     eGFR's persistently <90 mL/min signify possible Chronic Kidney     Disease.      Physical Exam  Vitals reviewed.  Eyes: EOM are normal.  Neck: Normal range of motion. Neck supple. No thyromegaly present.  Cardiovascular: Normal rate and regular rhythm.  Respiratory: Effort normal and breath sounds normal. No respiratory distress.  GI: Soft. Bowel sounds are normal.  He exhibits no distension.  Neurological: He is alert.  Right-sided facial weakness. Speech is mildly dysarthric  He was oriented x3 and follows simple commands. RUE is trace deltoid/pec and 0/5 distally. RLE 4 HF and KE,Right hamstrings 3-/5 Left side normal. Sensation normal to LT in BUEs, reduced at foot and ankle on Left. Good insight and awareness  Skin: Skin is warm and dry.  Right BKA site is well-healed/shaped   Psychiatric: He has a normal mood and affect. His behavior is normal. Thought content normal    Assessment/Plan: 1. Functional deficits secondary to Left pontine infarct with R HP which require 3+ hours per day of interdisciplinary therapy in a comprehensive inpatient rehab setting. Physiatrist is providing close team supervision and 24 hour management of active medical problems listed below. Physiatrist and rehab team continue to assess barriers to discharge/monitor patient progress toward functional and medical goals. FIM:                   Comprehension Comprehension Mode: Auditory Comprehension: 7-Follows complex conversation/direction: With no assist  Expression Expression Mode: Verbal Expression: 5-Expresses basic needs/ideas: With no assist  Social Interaction Social Interaction: 7-Interacts appropriately with others - No medications needed.  Problem Solving Problem Solving: 5-Solves basic problems: With no assist  Memory Memory: 6-More than reasonable amt of time   Medical Problem List and Plan:  1. Thrombotic left pontine infarct  2. DVT Prophylaxis/Anticoagulation: Subcutaneous heparin. Monitor platelet counts and any signs of bleeding  3. Pain Management: Tylenol as needed  4. 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 Patient with prosthesis provided by [employer] prost[employer]  5. Neuropsych: This patient is capable of making decisions on his own behalf.  6. Hypertension. No current antihypertensive medication. Patient on Benicar 40 mg daily  prior to  admission. Monitor with increased mobility  7. Tobacco abuse. NicoDerm patch. Provide counseling   LOS (Days) 1 A FACE TO FACE EVALUATION WAS PERFORMED  Charlett Blake 03/29/2014, 6:52 AM

## 2014-03-29 NOTE — Evaluation (Signed)
Speech Language Pathology Assessment and Plan  Patient Details  Name: Jordan Arellano MRN: [id number] Date of Birth: 06/02/1951  SLP Diagnosis: Dysarthria;Cognitive Impairments  Rehab Potential: Excellent ELOS: 1 week for SLP   Today's Date: 03/29/2014 Time: 4580-9983 Time Calculation (min): 55 min  Problem List:  Patient Active Problem List   Diagnosis Date Noted  . CVA (cerebral infarction) 03/25/2014  . HTN (hypertension) 03/25/2014  . Tobacco abuse 03/25/2014   Past Medical History:  Past Medical History  Diagnosis Date  . Hypertension   . TIA (transient ischemic attack)   . Stroke    Past Surgical History:  Past Surgical History  Procedure Laterality Date  . Ambutation    . Below knee leg amputation Right     Assessment / Plan / Recommendation Clinical Impression Jordan Arellano is a 63 y.o. right handed male with history of hypertension, TIA as well as history of right BKA greater than 13 years ago and uses a prosthesis. Patient independent prior to admission living alone. Admitted 03/25/2014 with right-sided weakness and dysarthria. 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 Placed on aspirin for CVA prophylaxis with subcutaneous heparin for DVT prophylaxis. NicoDerm patch placed for history of tobacco abuse. Tolerating a regular consistency diet. Physical and Occupational therapy evaluations completed 03/25/2014 with recommendations for physical medicine rehabilitation consult. Patient was admitted for comprehensive rehabilitation program 03/28/14.  Orders received and speech-language evaluation completed. Patient demonstrates mild memory impairments as well as mild dysarthria, characterized by imprecise consonant productions.  As a result, this patient would benefit from skilled SLP services to maximize functional  independence prior to discharge.    Skilled Therapeutic Interventions          SLP initiated education regarding effective speech intelligibility strategies for improved articulation.    SLP Assessment  Patient will need skilled [facility] during CIR admission    Recommendations  Patient destination: Home Follow up Recommendations: None Equipment Recommended: None recommended by SLP    SLP Frequency 3 out of 7 days   SLP Treatment/Interventions Cognitive remediation/compensation;Cueing hierarchy;Functional tasks;Internal/external aids;Oral motor exercises;Patient/family education;Speech/Language facilitation;Therapeutic Activities    Pain Pain Assessment Pain Assessment: No/denies pain Prior Functioning Cognitive/Linguistic Baseline: Within functional limits Type of Home: House  Lives With: Alone Available Help at Discharge: Family;Available PRN/intermittently  Short Term Goals: Week 1: SLP Short Term Goal 1 (Week 1): STG=LTG  See FIM for current functional status Refer to Care Plan for Long Term Goals  Recommendations for other services: None  Discharge Criteria: Patient will be discharged from SLP if patient refuses treatment 3 consecutive times without medical reason, if treatment goals not met, if there is a change in medical status, if patient makes no progress towards goals or if patient is discharged from hospital.  The above assessment, treatment plan, treatment alternatives and goals were discussed and mutually agreed upon: by patient  Gunnar Fusi, M.A., CCC-SLP [place] 03/29/2014, 4:18 PM

## 2014-03-29 NOTE — Progress Notes (Signed)
Patient information reviewed and entered into eRehab system by Shayne Diguglielmo, RN, CRRN, PPS Coordinator.  Information including medical coding and functional independence measure will be reviewed and updated through discharge.     Per nursing patient was given "Data Collection Information Summary for Patients in Inpatient Rehabilitation Facilities with attached "Privacy Act Statement-Health Care Records" upon admission.  

## 2014-03-29 NOTE — Progress Notes (Signed)
Occupational Therapy Session Note  Patient Details  Name: Jordan Arellano MRN: [id number] Date of Birth: 1951/06/10  Today's Date: 03/29/2014 Time: 1330-1400 Time Calculation (min): 30 min  Skilled Therapeutic Interventions/Progress Updates:    Pt seen for pm OT treatment.  Focused session on education of self AAROM exercises for the RUE as well as positioning and handling of the hemi UE.  He was able to return demonstrate all exercises with min instructional cueing for correct technique.  Issued handout as well for reference.  Pt able to perform stand pivot transfers to the mat and wheelchair during session with min assist.  He is able to advance his RLE as well with supervision but needs UE support on the left side to help with balance.    Therapy Documentation Precautions:  Precautions Precautions: Fall Precaution Comments: 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 Restrictions Weight Bearing Restrictions: No  Pain: Pain Assessment Pain Assessment: No/denies pain Pain Score: 0-No pain ADL: See FIM for current functional status  Therapy/Group: Individual Therapy  Cindra Presume OTR/L 03/29/2014, 2:52 PM

## 2014-03-29 NOTE — Evaluation (Signed)
Physical Therapy Assessment and Plan  Patient Details  Name: Jordan Arellano MRN: [id number] Date of Birth: [birth year]  PT Diagnosis: Abnormal posture, Abnormality of gait, Cognitive deficits, Difficulty walking, Hemiparesis dominant, Impaired cognition and Muscle weakness Rehab Potential: Excellent ELOS: 14-16 days   Today's Date: 03/29/2014 Time: 9242-6834 Time Calculation (min): 58 min  Problem List:  Patient Active Problem List   Diagnosis Date Noted  . CVA (cerebral infarction) 03/25/2014  . HTN (hypertension) 03/25/2014  . Tobacco abuse 03/25/2014    Past Medical History:  Past Medical History  Diagnosis Date  . Hypertension   . TIA (transient ischemic attack)   . Stroke    Past Surgical History:  Past Surgical History  Procedure Laterality Date  . Ambutation    . Below knee leg amputation Right     Assessment & Plan Clinical Impression: Patient is a 63 y.o. 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 Patient independent prior to admission living alone. Admitted 03/25/2014 with right-sided weakness and dysarthria. 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 Echocardiogram with ejection fraction 19% grade 1 diastolic dysfunction. Carotid Dopplers with no ICA stenosis.  Patient transferred to CIR on 03/28/2014 .   Patient currently requires max with mobility secondary to muscle weakness, impaired timing and sequencing and decreased coordination and decreased awareness, decreased problem solving, decreased safety awareness and decreased memory.  Prior to hospitalization, patient was independent  with mobility and lived with Alone in a House home.  Home access is 1 step in the back, 4 steps in the frontStairs to enter.  Patient  will benefit from skilled PT intervention to maximize safe functional mobility and minimize fall risk for planned discharge home with intermittent assist.  Anticipate patient will benefit from follow up OP at discharge.  PT - End of Session Activity Tolerance: Endurance does not limit participation in activity Endurance Deficit: No PT Assessment Rehab Potential: Excellent Barriers to Discharge: Decreased caregiver support PT Patient demonstrates impairments in the following area(s): Balance;Motor;Safety PT Transfers Functional Problem(s): Bed Mobility;Bed to Chair;Car;Furniture;Floor PT Locomotion Functional Problem(s): Ambulation;Wheelchair Mobility;Stairs PT Plan PT Intensity: Minimum of 1-2 x/day ,45 to 90 minutes PT Frequency: 5 out of 7 days PT Duration Estimated Length of Stay: 14-16 days PT Treatment/Interventions: Ambulation/gait training;Balance/vestibular training;Cognitive remediation/compensation;Discharge planning;DME/adaptive equipment instruction;Functional mobility training;Neuromuscular re-education;Patient/family education;Stair training;Therapeutic Activities;Therapeutic Exercise;UE/LE Strength taining/ROM;UE/LE Coordination activities;Wheelchair propulsion/positioning PT Transfers Anticipated Outcome(s): Mod I  PT Locomotion Anticipated Outcome(s): Mod I PT Recommendation Recommendations for Other Services: Neuropsych consult Follow Up Recommendations: Outpatient PT (outpatient if he can get a ride) Patient destination: Home Equipment Recommended: To be determined  Skilled Therapeutic Intervention PT evaluation and assessment completed, see full details below.  Initiated treatment and intervention with bed mobility as pt tends to be unable to manage or attend to RUE during bed mobility, however once he would bring RUE across body prior to rolling he was able to complete bed mobility at min to light mod assist.  Began gait training with use of hemi walker (attempted to use  platform RW as he used in acute care, however was unable to find platform that would fit RW at time of eval).  He was able to perform gait x 65' at min assist level with cues for  upright posture, light assist at R knee to prevent buckle (no overt buckling during gait this morning) and light assist for placement of RLE, as he tended to demonstrate scissored gait pattern with RW.  Also performed stairs.  Pt able to recall correct stepping pattern and performed at min assist level.  Assisted pt back to room and discussed rehab schedule, ELOS, expected outcomes, equipment and follow up.  Pt verbalized understanding.  Pt unable to recall use of call bell for assist, therefore donned quick release belt for safety.  Pt left with all needs in reach.    PT Evaluation Precautions/Restrictions Precautions Precautions: Fall Precaution Comments: 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 Restrictions Weight Bearing Restrictions: No General Chart Reviewed: Yes Family/Caregiver Present: No Vital Signs  Pain Pain Assessment Pain Assessment: No/denies pain Pain Score: 0-No pain Home Living/Prior Functioning Home Living Available Help at Discharge: Family;Available PRN/intermittently (aunt lives next door) Type of Home: House Home Access: Stairs to enter [employer] of Steps: 1 step in the back, 4 steps in the front Entrance Stairs-Rails:  (has one handrail in front with 4 STE) Home Layout: One level  Lives With: Alone Prior Function Level of Independence: Independent with basic ADLs;Independent with gait;Independent with transfers  Able to Take Stairs?: Yes Driving: No Leisure: Hobbies-yes (Comment) (likes to work with cars/engines) Comments: Does not use a cane or a walker, but does use his prosthesis with gait.   Vision/Perception   See OT note Cognition Overall Cognitive Status: Impaired/Different from baseline Orientation Level: Oriented X4 Attention:  Focused;Sustained;Selective Focused Attention: Appears intact Sustained Attention: Appears intact Selective Attention: Appears intact Memory: Impaired Memory Impairment: Decreased recall of new information Awareness: Impaired Awareness Impairment: Emergent impairment Safety/Judgment: Impaired Comments: Feel that pt would likely not attempt to get up on his own, however when asked what he would do if he needed to get up, he verbalized steps to get up and then with hinting cues for "would you get up by yourself" he states "no" but could not recall to use call bell and to wait for staff.   Sensation Sensation Light Touch: Appears Intact Stereognosis: Not tested Hot/Cold: Not tested Proprioception: Appears Intact Coordination Gross Motor Movements are Fluid and Coordinated: No Coordination and Movement Description: pt with difficulty placing RLE during gait.  Likely weakness from CVA plus use of prosthetic leg rather than true ataxia.  Motor  Motor Motor: Hemiplegia;Abnormal postural alignment and control Motor - Skilled Clinical Observations: Pt with very little to no active movement in RUE and weakness in RLE, esp hip motions.  Also note increased R lateral lean in sitting and standing.   Mobility Bed Mobility Bed Mobility: Supine to Sit;Sit to Supine Supine to Sit: 3: Mod assist;HOB flat Supine to Sit Details: Verbal cues for sequencing;Verbal cues for technique;Verbal cues for precautions/safety;Manual facilitation for weight shifting;Manual facilitation for placement Sit to Supine: 4: Min assist Sit to Supine - Details: Verbal cues for sequencing;Verbal cues for technique;Verbal cues for precautions/safety;Manual facilitation for weight shifting Transfers Transfers: Yes Sit to Stand: 2: Max assist (initially without device) Sit to Stand Details: Verbal cues for sequencing;Verbal cues for technique;Verbal cues for precautions/safety;Manual facilitation for weight shifting;Manual  facilitation for weight bearing Stand to Sit: 2: Max assist Stand to Sit Details (indicate cue type and reason): Verbal cues for sequencing;Verbal cues for technique;Verbal cues for precautions/safety;Manual facilitation for weight shifting;Manual facilitation for weight bearing Stand Pivot Transfers: 2: Max assist (without device) Stand Pivot Transfer Details: Verbal cues  for sequencing;Verbal cues for technique;Verbal cues for precautions/safety;Manual facilitation for weight shifting;Manual facilitation for weight bearing Locomotion  Ambulation Ambulation: Yes Ambulation/Gait Assistance: 1: +2 Total assist (without AD, min assist with hemi walker) Ambulation Distance (Feet): 10 Feet (then another 49' w/ hemi walker) Assistive device: 2 person hand held assist;Hemi-walker Ambulation/Gait Assistance Details: Verbal cues for sequencing;Verbal cues for technique;Verbal cues for precautions/safety;Verbal cues for gait pattern;Verbal cues for safe use of DME/AE;Manual facilitation for weight shifting;Manual facilitation for placement;Manual facilitation for weight bearing Gait Gait: Yes Gait Pattern: Impaired Gait Pattern: Step-through pattern;Decreased stance time - right;Decreased hip/knee flexion - right;Lateral trunk lean to right;Trunk flexed;Narrow base of support Stairs / Additional Locomotion Stairs: Yes Stairs Assistance: 4: Min assist Stairs Assistance Details: Verbal cues for sequencing;Verbal cues for technique;Verbal cues for precautions/safety;Manual facilitation for weight shifting Stair Management Technique: One rail Left;Step to pattern;Forwards Number of Stairs: 5 Height of Stairs: 4 Wheelchair Mobility Wheelchair Mobility: Yes Wheelchair Assistance: 4: Advertising account executive Details: Tactile cues for initiation;Verbal cues for sequencing;Verbal cues for technique;Verbal cues for precautions/safety Wheelchair Propulsion: Left upper extremity;Left lower  extremity Wheelchair Parts Management: Needs assistance Distance: 70  Trunk/Postural Assessment  Cervical Assessment Cervical Assessment: Within Functional Limits Thoracic Assessment Thoracic Assessment: Within Functional Limits Lumbar Assessment Lumbar Assessment: Within Functional Limits Postural Control Postural Control: Deficits on evaluation Postural Limitations: Pt with decreased righting reaction when losing balance to the R, increased forward head/rounded shoulders in sitting with R lateral lean in standing.   Balance Balance Balance Assessed: Yes Static Sitting Balance Static Sitting - Balance Support: Feet supported Static Sitting - Level of Assistance: 5: Stand by assistance Dynamic Sitting Balance Dynamic Sitting - Balance Support: Right upper extremity supported;Feet supported Dynamic Sitting - Level of Assistance: 4: Min assist Static Standing Balance Static Standing - Balance Support: During functional activity;Left upper extremity supported Static Standing - Level of Assistance: 3: Mod assist;2: Max assist Extremity Assessment      RLE Assessment RLE Assessment: Exceptions to [facility] RLE Strength RLE Overall Strength Comments: hip flex 2+/5, hip abd 2+/5, hip add 2+/5, knee ext and flex 4/5 LLE Assessment LLE Assessment: Within Functional Limits  FIM:  FIM - Bed/Chair Transfer Bed/Chair Transfer Assistive Devices: Arm rests Bed/Chair Transfer: 3: Supine > Sit: Mod A (lifting assist/Pt. 50-74%/lift 2 legs;4: Sit > Supine: Min A (steadying pt. > 75%/lift 1 leg);2: Chair or W/C > Bed: Max A (lift and lower assist);2: Bed > Chair or W/C: Max A (lift and lower assist) FIM - Locomotion: Wheelchair Distance: 70 Locomotion: Wheelchair: 2: Travels 50 - 149 ft with minimal assistance (Pt.>75%) FIM - Locomotion: Ambulation Locomotion: Ambulation Assistive Devices: Other (comment) (2 person HHA initially then hemi walker) Ambulation/Gait Assistance: 1: +2 Total assist  (without AD, min assist with hemi walker) Locomotion: Ambulation: 1: Two helpers (without AD) FIM - Locomotion: Stairs Locomotion: Scientist, physiological: Hand rail - 1 Locomotion: Stairs: 2: Up and Down 4 - 11 stairs with minimal assistance (Pt.>75%)   Refer to Care Plan for Long Term Goals  Recommendations for other services: Neuropsych  Discharge Criteria: Patient will be discharged from PT if patient refuses treatment 3 consecutive times without medical reason, if treatment goals not met, if there is a change in medical status, if patient makes no progress towards goals or if patient is discharged from hospital.  The above assessment, treatment plan, treatment alternatives and goals were discussed and mutually agreed upon: by patient  Raquel Sarna A Michaila Kenney 03/29/2014, 11:01 AM

## 2014-03-29 NOTE — Progress Notes (Signed)
Social Work Assessment and Plan Social Work Assessment and Plan  Patient Details  Name: Jordan Arellano MRN: [id number] Date of Birth: 11/27/1951  Today's Date: 03/29/2014  Problem List:  Patient Active Problem List   Diagnosis Date Noted  . CVA (cerebral infarction) 03/25/2014  . HTN (hypertension) 03/25/2014  . Tobacco abuse 03/25/2014   Past Medical History:  Past Medical History  Diagnosis Date  . Hypertension   . TIA (transient ischemic attack)   . Stroke    Past Surgical History:  Past Surgical History  Procedure Laterality Date  . Ambutation    . Below knee leg amputation Right    Social History:  reports that he has been smoking Cigarettes.  He has a 17.5 pack-year smoking history. He has never used smokeless tobacco. He reports that he does not drink alcohol or use illicit drugs.  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  His anut and uncle live next door and they will also assist if needed.  Pt lost his wife 8 years ago and still misses her.  She helped him when he lost his leg-10 years ago.  Social History Preferred language: English Religion: Holiness Cultural Background: No issues Education: High School Read: Yes Write: Yes Employment Status: Disabled Date Retired/Disabled/Unemployed: 2005 Freight forwarder Issues: No issues Guardian/Conservator: None-according to MD pt is capable of making his own decisions while here   Abuse/Neglect Physical Abuse: Denies Verbal Abuse: Denies Sexual Abuse:  Denies Exploitation of patient/patient's resources: Denies Self-Neglect: Denies  Emotional Status Pt's affect, behavior adn adjustment status: Pt is motivated and feels he could give up but he is not that type of person.  He has been through a lot with sonand wife dying young.  He recovered and regained his independence and he plans to do this again.  He is doing as well as expected with still processing what has happened to him. Recent Psychosocial Issues: Other medical issues-BKA 10 years ago-used prothesis PTA Pyschiatric History: No history at this time deferred depression screen but will have Neuro-psych see due to could very easily become depressed over this and his other losses.  He states: " I've been thinking a lot and not didn't sleep last night."  Will monitor and refer to Neuro-psych Substance Abuse History: Tobacco using a patch but plans to try to quit smoking.  Aware of the resources avaialble to him  Patient / Family Perceptions, Expectations & Goals Pt/Family understanding of illness & functional limitations: Pt is able to explain his stroke and deficits.  He is encouraged by the progress he is making and hopeful this will continue.  He has always relied upon himself and feels he can do this.  Aware of the rehab program since has ben here before many years ago.  He is glad he is here. 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, etc Anticipated changes in roles/activities/participation: resume Pt/family expectations/goals: Pt states; " I hope to be able to take care of myself, I don't have anybody at home."    [employer]: None Premorbid Home Care/DME Agencies: Other (Comment) (Had years ago with BKA)  Transportation available at discharge: Son was prior to admission Resource referrals recommended: Support group (specify) (CVA Support & AMputee SUpport group)  Discharge Planning Living Arrangements:  Alone Support Systems: Children;Other relatives;Friends/neighbors;Church/faith community Type of Residence: Private residence Insurance Resources: Multimedia programmer (specify) Primary school teacher) Financial Resources: SSD Financial Screen Referred: No Living Expenses: Own Money Management: Patient Does the patient have any problems obtaining your medications?: No Home Management: Self Patient/Family Preliminary Plans: Return home with son and uncle checking on him.  Son provides transportaiton and checks on him daily.  Pt needs to be mod/i levle to return home.  He has the potential to reach these goals and is motivated to achieve this.  Await teams' evaluations. Social Work Anticipated Follow Up Needs: HH/OP;Support Group  Clinical Impression Pleasant gentleman who is motivated and willing to work in therapies to achieve his goals of mod/i level.  He has intermittent assist at home so needs to be mod/i level to go home. Would benefit from Neuro-psych involvement for coping, multiple losses in a short period of time and this has him thinking about them and himself.  Await teams' evaluations  Elease Hashimoto 03/29/2014, 11:25 AM

## 2014-03-29 NOTE — Evaluation (Signed)
Occupational Therapy Assessment and Plan  Patient Details  Name: Jordan Arellano MRN: [id number] Date of Birth: Jul 29, 1951  OT Diagnosis: abnormal posture, flaccid hemiplegia and hemiparesis and muscle weakness (generalized) Rehab Potential: Rehab Potential: Good ELOS: 14-16 days   Today's Date: 03/29/2014 Time: 1101-1200 Time Calculation (min): 59 min  Problem List:  Patient Active Problem List   Diagnosis Date Noted  . CVA (cerebral infarction) 03/25/2014  . HTN (hypertension) 03/25/2014  . Tobacco abuse 03/25/2014    Past Medical History:  Past Medical History  Diagnosis Date  . Hypertension   . TIA (transient ischemic attack)   . Stroke    Past Surgical History:  Past Surgical History  Procedure Laterality Date  . Ambutation    . Below knee leg amputation Right     Assessment & Plan Clinical Impression: Patient is a 63 y.o. year old male with recent admission to the hospital on 03/25/2014 with right-sided weakness and dysarthria. 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 Patient transferred to CIR on 03/28/2014 .    Patient currently requires mod with basic self-care skills secondary to muscle weakness, impaired timing and sequencing, unbalanced muscle activation and decreased coordination and decreased standing balance, decreased postural control, hemiplegia and decreased balance strategies.  Prior to hospitalization, patient could complete ADLs with modified independent .  Patient will benefit from skilled intervention to decrease level of assist with basic self-care skills and increase independence with basic self-care skills prior to discharge home alone but with nearby family providing some intermittent assist initially.  Anticipate patient will require intermittent supervision and follow up  outpatient.  OT - End of Session Activity Tolerance: Tolerates 30+ min activity without fatigue Endurance Deficit: No OT Assessment Rehab Potential: Good Barriers to Discharge: Decreased caregiver support Barriers to Discharge Comments: Pt lives alone but has family that lives nearby.   OT Patient demonstrates impairments in the following area(s): Balance;Safety;Motor OT Basic ADL's Functional Problem(s): Eating;Grooming;Bathing;Dressing;Toileting OT Advanced ADL's Functional Problem(s): Simple Meal Preparation OT Transfers Functional Problem(s): Toilet;Tub/Shower OT Additional Impairment(s): Fuctional Use of Upper Extremity OT Plan OT Intensity: Minimum of 1-2 x/day, 45 to 90 minutes OT Frequency: 5 out of 7 days OT Duration/Estimated Length of Stay: 14-16 days OT Treatment/Interventions: Medical illustrator training;Community reintegration;Discharge planning;Functional mobility training;DME/adaptive equipment instruction;Functional electrical stimulation;Patient/family education;Pain management;Neuromuscular re-education;Self Care/advanced ADL retraining;Therapeutic Exercise;UE/LE Strength taining/ROM;UE/LE Coordination activities;Splinting/orthotics;Therapeutic Activities;Psychosocial support OT Self Feeding Anticipated Outcome(s): modified independent OT Basic Self-Care Anticipated Outcome(s): supervision OT Toileting Anticipated Outcome(s): modified independent OT Bathroom Transfers Anticipated Outcome(s): modified independent to supervision OT Recommendation Patient destination: Home Follow Up Recommendations: Outpatient OT Equipment Recommended: 3 in 1 bedside comode;Tub/shower bench   OT Evaluation Precautions/Restrictions  Precautions Precautions: Fall Precaution Comments: 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 Restrictions Weight Bearing Restrictions: No  Vital Signs Therapy Vitals Temp: 97.7 F (36.5 C) Temp src: Oral Pulse Rate: 87 BP: 119/95  mmHg Patient Position, if appropriate: Sitting Oxygen Therapy SpO2: 98 % O2 Device: None (Room air) Pain Pain Assessment Pain Assessment: No/denies pain Home Living/Prior Functioning Home Living Living Arrangements: Alone Available Help at Discharge: Family;Available PRN/intermittently (aunt lives next door) Type of Home: House Home Access: Stairs to enter [employer] of Steps: 1 step in the back, 4 steps in the front Entrance Stairs-Rails:  (has one handrail in front with 4 STE) Home Layout: One  level  Lives With: Alone IADL History Current License: Yes Occupation: On disability Leisure and Hobbies: 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   ADL  See FIM scale for details  Vision/Perception  Vision- History Baseline Vision/History: Wears glasses Wears Glasses: Reading only Patient Visual Report: No change from baseline Vision- Assessment Vision Assessment?: Yes Eye Alignment: Within Functional Limits Ocular Range of Motion: Within Functional Limits Alignment/Gaze Preference: Within Defined Limits Tracking/Visual Pursuits: Able to track stimulus in all quads without difficulty Saccades: Within functional limits Visual Fields: No apparent deficits  Cognition Overall Cognitive Status: Within Functional Limits for tasks assessed Orientation Level: Oriented X4 Attention: Sustained;Selective Focused Attention: Appears intact Sustained Attention: Appears intact Selective Attention: Appears intact Memory: Appears intact Awareness: Appears intact Problem Solving: Appears intact Safety/Judgment: Appears intact Comments: Pt able to respond appropriately  with steps if he needed to go to the bathroom and that he would call for the nurse.  Sequencing through ADL WNLs.  Will continue to further assess cognition in context of function.   Sensation Sensation Light Touch: Appears Intact Stereognosis: Not tested Hot/Cold: Not tested Proprioception: Appears Intact Coordination Gross Motor Movements are Fluid and Coordinated: No Fine Motor Movements are Fluid and Coordinated: No Coordination and Movement Description: Pt currently presents at a Brunnstrum stage II level in the right arm and stage I in the hand.   Motor  Motor Motor: Hemiplegia;Abnormal postural alignment and control Motor - Discharge Observations: Pt with Brunnstrum stage I-II in the right hand.  Also with right lateral lean and posterior bias as well.   Mobility  Bed Mobility Bed Mobility: Supine to Sit;Sit to Supine Supine to Sit: 4: Min assist;HOB flat Supine to Sit Details: Verbal cues for technique;Manual facilitation for weight shifting Sit to Supine: 4: Min assist Sit to Supine - Details: Manual facilitation for weight shifting;Verbal cues for technique Transfers Transfers: Sit to Stand Sit to Stand: From toilet;2: Max assist;Without upper extremity assist Sit to Stand Details: Verbal cues for sequencing;Verbal cues for technique;Verbal cues for precautions/safety;Manual facilitation for weight shifting;Manual facilitation for weight bearing Stand to Sit: Without upper extremity assist;2: Max assist Stand to Sit Details (indicate cue type and reason): Verbal cues for sequencing;Verbal cues for technique;Verbal cues for precautions/safety;Manual facilitation for weight shifting;Manual facilitation for weight bearing  Trunk/Postural Assessment  Cervical Assessment Cervical Assessment: Within Functional Limits Thoracic Assessment Thoracic Assessment: Exceptions to [facility] Thoracic Strength Overall Thoracic Strength Comments: Pt with slight thoracic kyposis in sitting. Lumbar  Assessment Lumbar Assessment: Within Functional Limits Postural Control Postural Control: Deficits on evaluation Postural Limitations: Pt with rounded protracted shoulders in sitting as well as slight rotation to the right.    Balance Balance Balance Assessed: Yes Static Sitting Balance Static Sitting - Balance Support: Feet supported Static Sitting - Level of Assistance: 5: Stand by assistance Dynamic Sitting Balance Dynamic Sitting - Balance Support: Right upper extremity supported;Feet supported Dynamic Sitting - Level of Assistance: 4: Min assist Static Standing Balance Static Standing - Balance Support: During functional activity;Left upper extremity supported Static Standing - Level of Assistance: 3: Mod assist Dynamic Standing Balance Dynamic Standing - Balance Support: Left upper extremity supported Dynamic Standing - Level of Assistance: 2: Max assist Extremity/Trunk Assessment RUE Assessment  RUE Assessment: Exceptions to [facility] RUE Strength RUE Overall Strength Comments: Pt demonstrates Brunnstrum stage I movement in the right hand and stage II in the arm.  Slight shoulder flexion and elevation noted with beginning synergy pattern.  He currently demonstrates PROM WFLs for all joints.   LUE Assessment LUE Assessment: Within Functional Limits  FIM:  FIM - Grooming Grooming Steps: Wash, rinse, dry face;Brush, comb hair Grooming: 4: Patient completes 3 of 4 or 4 of 5 steps FIM - Bathing Bathing Steps Patient Completed: Chest;Right Arm;Abdomen;Front perineal area;Buttocks;Right upper leg;Left upper leg Bathing: 3: Mod-Patient completes 5-7 [id number] parts or 50-74% FIM - Upper Body Dressing/Undressing Upper body dressing/undressing steps patient completed: Thread/unthread left sleeve of pullover shirt/dress;Put head through opening of pull over shirt/dress;Pull shirt over trunk Upper body dressing/undressing: 4: Min-Patient completed 75 plus % of tasks FIM - Lower Body  Dressing/Undressing Lower body dressing/undressing steps patient completed: Thread/unthread right underwear leg;Thread/unthread left underwear leg;Thread/unthread right pants leg;Thread/unthread left pants leg;Fasten/unfasten left shoe Lower body dressing/undressing: 4: Min-Patient completed 75 plus % of tasks FIM - Toileting Toileting steps completed by patient: Adjust clothing prior to toileting;Performs perineal hygiene;Adjust clothing after toileting Toileting: 4: Steadying assist FIM - TRadio producerDevices: Elevated toilet seat;Grab bars Toilet Transfers: 4-To toilet/BSC: Min A (steadying Pt. > 75%);3-From toilet/BSC: Mod A (lift or lower assist) FIM - Tub/Shower Transfers Tub/shower Transfers: 0-Activity did not occur or was simulated   Refer to Care Plan for Long Term Goals  Recommendations for other services: None  Discharge Criteria: Patient will be discharged from OT if patient refuses treatment 3 consecutive times without medical reason, if treatment goals not met, if there is a change in medical status, if patient makes no progress towards goals or if patient is discharged from hospital.  The above assessment, treatment plan, treatment alternatives and goals were discussed and mutually agreed upon: by patient During session began work on static and dynamic standing balance during selfcare tasks.  Also discussed and practiced positioning of the RUE with supervision.  Provided half lap tray for support and positioning as well.   JCindra PresumeOTR/L 03/29/2014, 3:33 PM

## 2014-03-29 NOTE — Care Management Note (Signed)
[facility]patient [facility] Individual Statement of Services  Patient Name:  Jordan Arellano  Date:  03/29/2014  Welcome to the [facility].  Our goal is to provide you with an individualized program based on your diagnosis and situation, designed to meet your specific needs.  With this comprehensive rehabilitation program, you will be expected to participate in at least 3 hours of rehabilitation therapies Monday-Friday, with modified therapy programming on the weekends.  Your rehabilitation program will include the following services:  Physical Therapy (PT), Occupational Therapy (OT), Speech Therapy (ST), 24 hour per day rehabilitation nursing, Neuropsychology, Case Management (Social Worker), Rehabilitation Medicine, Nutrition Services and Pharmacy Services  Weekly team conferences will be held on Wednesday to discuss your progress.  Your Social Worker will talk with you frequently to get your input and to update you on team discussions.  Team conferences with you and your family in attendance may also be held.  Expected length of stay: 2 weeks Overall anticipated outcome: mod/i level  Depending on your progress and recovery, your program may change. Your Social Worker will coordinate services and will keep you informed of any changes. Your Social Worker's name and contact numbers are listed  below.  The following services may also be recommended but are not provided by the [facility] will be made to provide these services after discharge if needed.  Arrangements include referral to agencies that provide these services.  Your insurance has been verified to be:  [employer]-Medicare Your primary doctor is:  Dr York Ram  Pertinent information will be shared with your doctor and your insurance company.  Social Worker:  Ovidio Kin, [place] or ([phone]  Information discussed with and copy given to patient by: Elease Hashimoto, 03/29/2014, 11:05 AM

## 2014-03-30 ENCOUNTER — Inpatient Hospital Stay (HOSPITAL_COMMUNITY): Payer: Medicare Other | Admitting: Physical Therapy

## 2014-03-30 ENCOUNTER — Inpatient Hospital Stay (HOSPITAL_COMMUNITY): Payer: Medicare Other | Admitting: Speech Pathology

## 2014-03-30 ENCOUNTER — Inpatient Hospital Stay (HOSPITAL_COMMUNITY): Payer: Medicare Other | Admitting: Occupational Therapy

## 2014-03-30 DIAGNOSIS — F17200 Nicotine dependence, unspecified, uncomplicated: Secondary | ICD-10-CM

## 2014-03-30 DIAGNOSIS — I10 Essential (primary) hypertension: Secondary | ICD-10-CM

## 2014-03-30 NOTE — Progress Notes (Signed)
Occupational Therapy Session Note  Patient Details  Name: Jordan Arellano MRN: [id number] Date of Birth: [birth year]  Today's Date: 03/30/2014 Time: 0930-1030 Time Calculation (min): 60 min  Skilled Therapeutic Interventions/Progress Updates:Patient participated in skilled OT services to address effects of the CVA in addition to resulting balance issues from old R BKA with current R UE hemiparesis.   Patient required overall Mod A for the bathing and dressing.  He was reception to education given for working on transfers, LE and lower body dressing and balance challenges with the R UE hemiparesis, particularly when his R LE prosthesis has not been donned .         Therapy Documentation Precautions:  Precautions Precautions: Fall Precaution Comments: 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 Restrictions Weight Bearing Restrictions: No Pain: Pain Assessment Pain Assessment: No/denies pain  See FIM for current functional status  Therapy/Group: Individual Therapy  Herschell Dimes 03/30/2014, 11:59 AM

## 2014-03-30 NOTE — Progress Notes (Signed)
Jordan Arellano is a 63 y.o. male 29-Apr-1951 [id number]  Subjective: No new complaints. No new problems. Slept well. Feeling OK.  Objective: Vital signs in last 24 hours: Temp:  [97.7 F (36.5 C)-98.7 F (37.1 C)] 98.7 F (37.1 C) (04/25 0452) Pulse Rate:  [70-87] 70 (04/25 0452) Resp:  [17-18] 18 (04/25 0452) BP: (115-131)/(75-95) 115/89 mmHg (04/25 0452) SpO2:  [94 %-99 %] 99 % (04/25 0452) Weight change:  Last BM Date: 03/27/14  Intake/Output from previous day: 04/24 0701 - 04/25 0700 In: 960 [P.O.:960] Out: 200 [Urine:200] Last cbgs: CBG (last 3)  No results found for this basename: GLUCAP,  in the last 72 hours   Physical Exam General: No apparent distress   HEENT: not dry Lungs: Normal effort. Lungs clear to auscultation, no crackles or wheezes. Cardiovascular: Regular rate and rhythm, no edema Abdomen: S/NT/ND; BS(+) Musculoskeletal:  unchanged Neurological: No new neurological deficits Wounds: N/A    Skin: clear  Aging changes Mental state: Alert, oriented, cooperative    Lab Results: BMET    Component Value Date/Time   NA 138 03/29/2014 0512   K 3.9 03/29/2014 0512   CL 103 03/29/2014 0512   CO2 21 03/29/2014 0512   GLUCOSE 128* 03/29/2014 0512   BUN 22 03/29/2014 0512   CREATININE 0.87 03/29/2014 0512   CALCIUM 9.0 03/29/2014 0512   GFRNONAA >90 03/29/2014 0512   GFRAA >90 03/29/2014 0512   CBC    Component Value Date/Time   WBC 10.9* 03/29/2014 0512   RBC 4.88 03/29/2014 0512   HGB 15.2 03/29/2014 0512   HCT 44.4 03/29/2014 0512   PLT 240 03/29/2014 0512   MCV 91.0 03/29/2014 0512   MCH 31.1 03/29/2014 0512   MCHC 34.2 03/29/2014 0512   RDW 14.8 03/29/2014 0512   LYMPHSABS 3.5 03/29/2014 0512   MONOABS 0.8 03/29/2014 0512   EOSABS 0.3 03/29/2014 0512   BASOSABS 0.1 03/29/2014 0512    Studies/Results: No results found.  Medications: I have reviewed the patient's current medications.  Assessment/Plan:  1. Thrombotic left pontine infarct  2. DVT  Prophylaxis/Anticoagulation: Subcutaneous heparin. Monitor platelet counts and any signs of bleeding  3. Pain Management: Tylenol as needed  4. 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 Patient with prosthesis provided by [employer] prost[employer]  5. Neuropsych: This patient is capable of making decisions on his own behalf.  6. Hypertension. No current antihypertensive medication. Patient on Benicar 40 mg daily prior to admission. Monitor with increased mobility  7. Tobacco abuse. NicoDerm patch. Provide counseling   Cont Rx     Length of stay, days: 2  Cassandria Anger , MD 03/30/2014, 9:33 AM

## 2014-03-30 NOTE — Progress Notes (Signed)
Physical Therapy Session Note  Patient Details  Name: Jordan Arellano MRN: [id number] Date of Birth: 06-11-1951  Today's Date: 03/30/2014 Time: 1100-1200 Time Calculation (min): 60 min  Short Term Goals: Week 1:  PT Short Term Goal 1 (Week 1): Pt will perform bed mobility to the R or L with attention and safety to RUE at min assist level  PT Short Term Goal 2 (Week 1): Pt will perform stand pivot transfer with LRAD at min/guard level with safe placement of device PT Short Term Goal 3 (Week 1): Pt will perform dynamic standing balance at min assist PT Short Term Goal 4 (Week 1): Pt will perform w/c mobility using L hemi technique x 150' at supervision level PT Short Term Goal 5 (Week 1): Pt will perform gait with LRAD x 150' at min/guard assist  Therapy Documentation Precautions:  Precautions Precautions: Fall Precaution Comments: 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 Restrictions Weight Bearing Restrictions: No Pain: Pain Assessment Pain Assessment: No/denies pain  Therapeutic Activity:(15') Transfer training from sit<->stand S/min-assist initially using hemiwalker but end of session S/Mod-Independent  Gait Training:(15') using hemiwalker with S/min-assist 5 x 50'. R Prosthetic limb/foot placement tends to scissor slightly Therapeutic Exetcise: (30') Nu-Step, Level 4, x 15' with rest breaks,  Seated B LE's active and manually resisted hip exercises.  Therapy/Group: Individual Therapy  Clearence Ped 03/30/2014, 11:13 AM

## 2014-03-30 NOTE — Progress Notes (Signed)
Speech Language Pathology Daily Session Note  Patient Details  Name: Corwyn Vora MRN: [id number] Date of Birth: 04-23-1951  Today's Date: 03/30/2014 Time: 0800-0845 Time Calculation (min): 45 min  Short Term Goals: Week 1: SLP Short Term Goal 1 (Week 1): STG=LTG  Skilled Therapeutic Interventions: Skilled ST intervention provided with focus on speech/language and cognitive goals. Pt seen in room for ST tx and participated in all tasks willingly. Slp introduced OME and provided visual example for each. Pt performed OME x3 each with mod-max verbal and visual cues. Slp reviewed speech strategies, provided thorough explanation and examples for each. Pt provided with handout for OME and speech strategies. Pt completed complex problem solving tasks with 85% accuracy, min assist.    FIM:  Comprehension Comprehension Mode: Auditory Comprehension: 6-Follows complex conversation/direction: With extra time/assistive device Expression Expression: 5-Expresses complex 90% of the time/cues < 10% of the time Social Interaction Social Interaction: 6-Interacts appropriately with others with medication or extra time (anti-anxiety, antidepressant). Problem Solving Problem Solving: 5-Solves basic 90% of the time/requires cueing < 10% of the time Memory Memory: 5-Recognizes or recalls 90% of the time/requires cueing < 10% of the time  Pain Pain Assessment Pain Assessment: No/denies pain  Therapy/Group: Individual Therapy  Bernerd Pho Lecia Esperanza 03/30/2014, 10:42 AM

## 2014-03-30 NOTE — Progress Notes (Signed)
Occupational Therapy Session Note  Patient Details  Name: Jordan Arellano MRN: [id number] Date of Birth: 1951/09/01  Today's Date: 03/30/2014 Time: 1300-1330 Time Calculation (min): 30 min  Patient seen for R UE neuro reeducation and educated on Self ROM exercises.  Muscle movement noted in shoulders for elevation but no active movement noted at elbow joints, wrist or in digits.  Therapy Documentation Precautions:  Precautions Precautions: Fall Precaution Comments: 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 Restrictions Weight Bearing Restrictions: No  Pain: denied   See FIM for current functional status  Therapy/Group: Individual Therapy  Herschell Dimes 03/30/2014, 3:55 PM

## 2014-03-31 ENCOUNTER — Inpatient Hospital Stay (HOSPITAL_COMMUNITY): Payer: Medicare Other | Admitting: Physical Therapy

## 2014-03-31 NOTE — IPOC Note (Signed)
Overall Plan of Care [facility]) Patient Details Name: Jordan Arellano MRN: [id number] DOB: Apr 08, 1951  Admitting Diagnosis: L [place] CVA w/hx of R BKA  Hospital Problems: Active Problems:   CVA (cerebral infarction)     Functional Problem List: Nursing Edema;Endurance;Motor;Sensory  PT Balance;Motor;Safety  OT Balance;Safety;Motor  SLP Cognition;Linguistic  TR         Basic ADL's: OT Eating;Grooming;Bathing;Dressing;Toileting     Advanced  ADL's: OT Simple Meal Preparation     Transfers: PT Bed Mobility;Bed to Chair;Car;Furniture;Floor  OT Toilet;Tub/Shower     Locomotion: PT Ambulation;Wheelchair Mobility;Stairs     Additional Impairments: OT Fuctional Use of Upper Extremity  SLP Communication;Social Cognition expression Memory  TR      Anticipated Outcomes Item Anticipated Outcome  Self Feeding modified independent  Swallowing      Basic self-care  supervision  Toileting  modified independent   Bathroom Transfers modified independent to supervision  Bowel/Bladder  Continent of bowel and bladder  Transfers  Mod I   Locomotion  Mod I  Communication  Mod I  Cognition  Mod I  Pain  </=3  Safety/Judgment  No falls with injury   Therapy Plan: PT Intensity: Minimum of 1-2 x/day ,45 to 90 minutes PT Frequency: 5 out of 7 days PT Duration Estimated Length of Stay: 14-16 days OT Intensity: Minimum of 1-2 x/day, 45 to 90 minutes OT Frequency: 5 out of 7 days OT Duration/Estimated Length of Stay: 14-16 days SLP Intensity: Minumum of 1-2 x/day, 30 to 90 minutes SLP Frequency: 3 out of 7 days SLP Duration/Estimated Length of Stay: 1 week for SLP       Team Interventions: Nursing Interventions Patient/Family Education;Medication Management;Skin Care/Wound Management;Psychosocial Support;Discharge Planning  PT interventions Ambulation/gait training;Balance/vestibular training;Cognitive remediation/compensation;Discharge planning;DME/adaptive equipment  instruction;Functional mobility training;Neuromuscular re-education;Patient/family education;Stair training;Therapeutic Activities;Therapeutic Exercise;UE/LE Strength taining/ROM;UE/LE Coordination activities;Wheelchair propulsion/positioning  OT Interventions Balance/vestibular training;Community reintegration;Discharge planning;Functional mobility training;DME/adaptive equipment instruction;Functional electrical stimulation;Patient/family education;Pain management;Neuromuscular re-education;Self Care/advanced ADL retraining;Therapeutic Exercise;UE/LE Strength taining/ROM;UE/LE Coordination activities;Splinting/orthotics;Therapeutic Activities;Psychosocial support  SLP Interventions Cognitive remediation/compensation;Cueing hierarchy;Functional tasks;Internal/external aids;Oral motor exercises;Patient/family education;Speech/Language facilitation;Therapeutic Activities  TR Interventions    SW/CM Interventions Discharge Planning;Psychosocial Support;Patient/Family Education    Team Discharge Planning: Destination: PT-Home ,OT- Home , SLP-Home Projected Follow-up: PT-Outpatient PT (outpatient if he can get a ride), OT-  Outpatient OT, SLP-None Projected Equipment Needs: PT-To be determined, OT- 3 in 1 bedside comode;Tub/shower bench, SLP-None recommended by SLP Equipment Details: PT- , OT-  Patient/family involved in discharge planning: PT- Patient,  OT-Patient, SLP-Patient  MD ELOS: 14 days Medical Rehab Prognosis:  Excellent Assessment: The patient has been admitted for CIR therapies. The team will be addressing functional mobility, strength, stamina, balance, safety, adaptive techniques and equipment, self-care, bowel and bladder mgt, patient and caregiver education, NMR, communication, prosthetic adjustment, pain mgt. Goals have been set at mod I for mobility and cognition and supervision to mod I with self-care/ADL's.    Meredith Staggers, MD, FAAPMR      See Team Conference Notes for  weekly updates to the plan of care

## 2014-03-31 NOTE — Progress Notes (Signed)
Jordan Arellano is a 63 y.o. male July 01, 1951 [id number]  Subjective: No new complaints. No new problems. Slept well. Feeling fair.  Objective: Vital signs in last 24 hours: Temp:  [97.7 F (36.5 C)-97.9 F (36.6 C)] 97.9 F (36.6 C) (04/26 0622) Pulse Rate:  [63-85] 63 (04/26 0622) Resp:  [16-18] 18 (04/26 0622) BP: (127-135)/(70-77) 130/70 mmHg (04/26 0622) SpO2:  [95 %-99 %] 98 % (04/26 0622) Weight change:  Last BM Date: 03/30/14  Intake/Output from previous day: 04/25 0701 - 04/26 0700 In: 1080 [P.O.:1080] Out: 600 [Urine:600] Last cbgs: CBG (last 3)  No results found for this basename: GLUCAP,  in the last 72 hours   Physical Exam General: No apparent distress  Eating breakfast HEENT: not dry Lungs: Normal effort. Lungs clear to auscultation, no crackles or wheezes. Cardiovascular: Regular rate and rhythm, no edema Abdomen: S/NT/ND; BS(+) Musculoskeletal:  unchanged Neurological: No new neurological deficits Wounds: N/A    Skin: clear  Aging changes Mental state: Alert, oriented, cooperative    Lab Results: BMET    Component Value Date/Time   NA 138 03/29/2014 0512   K 3.9 03/29/2014 0512   CL 103 03/29/2014 0512   CO2 21 03/29/2014 0512   GLUCOSE 128* 03/29/2014 0512   BUN 22 03/29/2014 0512   CREATININE 0.87 03/29/2014 0512   CALCIUM 9.0 03/29/2014 0512   GFRNONAA >90 03/29/2014 0512   GFRAA >90 03/29/2014 0512   CBC    Component Value Date/Time   WBC 10.9* 03/29/2014 0512   RBC 4.88 03/29/2014 0512   HGB 15.2 03/29/2014 0512   HCT 44.4 03/29/2014 0512   PLT 240 03/29/2014 0512   MCV 91.0 03/29/2014 0512   MCH 31.1 03/29/2014 0512   MCHC 34.2 03/29/2014 0512   RDW 14.8 03/29/2014 0512   LYMPHSABS 3.5 03/29/2014 0512   MONOABS 0.8 03/29/2014 0512   EOSABS 0.3 03/29/2014 0512   BASOSABS 0.1 03/29/2014 0512    Studies/Results: No results found.  Medications: I have reviewed the patient's current medications.  Assessment/Plan:  1. Thrombotic left pontine  infarct  2. DVT Prophylaxis/Anticoagulation: Subcutaneous heparin. Monitor platelet counts and any signs of bleeding  3. Pain Management: Tylenol as needed  4. 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 Patient with prosthesis provided by [employer]  5. Neuropsych: This patient is capable of making decisions on his own behalf.  6. Hypertension. No current antihypertensive medication. Patient on Benicar 40 mg daily prior to admission. Monitor with increased mobility  7. Tobacco abuse. NicoDerm patch. Provide counseling   Cont Rx     Length of stay, days: [place] , MD 03/31/2014, 8:35 AM

## 2014-03-31 NOTE — Progress Notes (Signed)
Physical Therapy Session Note  Patient Details  Name: Jordan Arellano MRN: [id number] Date of Birth: August 28, 1951  Today's Date: 03/31/2014 Time: 2094-7096 Time Calculation (min): 53 min  Short Term Goals: Week 1:  PT Short Term Goal 1 (Week 1): Pt will perform bed mobility to the R or L with attention and safety to RUE at min assist level  PT Short Term Goal 2 (Week 1): Pt will perform stand pivot transfer with LRAD at min/guard level with safe placement of device PT Short Term Goal 3 (Week 1): Pt will perform dynamic standing balance at min assist PT Short Term Goal 4 (Week 1): Pt will perform w/c mobility using L hemi technique x 150' at supervision level PT Short Term Goal 5 (Week 1): Pt will perform gait with LRAD x 150' at min/guard assist  Skilled Therapeutic Interventions/Progress Updates:   Pt resting in bed with family present; pt performed transfer to EOB with supervision and bed rail.  Seated EOB pt donned R prosthesis with LUE and min A.  Performed stand pivots bed > w/c > mat with L hemi walker and min-mod A with minimal weight through RLE and use of terminal knee extension to complete sit > stand without terminal hip extension and forward weight shift of COG.  On mat performed sit >stand training beginning with LUE pushing from mat but changing to LUE pushing through RUE on R knee during sit > stand x 8 reps with min-mod A to facilitate increased R lateral weight shift and increased activation of RLE extensors during sit <> stand.  Pt continued to demonstrate decreased weight shift to R with strong compensation with L lateral trunk/shoulder lean. Wedged L foot to facilitate even more R lateral weight shift and mod-max facilitation given at trunk for L trunk elongation and at R thigh to facilitate anterior translation of COG over feet during sit <> stand x 8 reps.  By end of session pt demonstrating more symmetrical sit <> stand with increased R weight shift and appropriate timing of RLE  hip and knee extension.    Performed gait and stair negotiation training.  Pt reports there is one step between the ground and his porch so he would have to take 2 steps total without a rail.  Discussed with family possibility of putting in a rail on both sides.  Family states that would be no problem.  Performed stair negotiation first with hemi walker with +2 A with one person stabilizing hemi walker on "porch" or on ground with second person providing mod assistance for balance and verbalizing safe step to sequence.  Then repeated stair negotiation with LUE support on rail with min A and min verbal cues for safe step to sequence.  Therapist strongly recommending addition of rails to pt stairs at home.  Returned to the room and pt set up.  Family with lots of questions about ELOS, D/C goals and D/C plan (home alone or to their house) and when they would be able to meet with the MD or social worker about the D/C plan.  Discussed with family coming in for therapy tomorrow with pt primary PT and discussing her goals as well as discussing other options for D/C plan if they feel the pt would not be able to manage home alone.  Family reports they have a room that he can stay temporarily if he needs extra help at D/C.  Also discussed with family that the PA would be able answer any medical questions they have  and the SW would be arranging f/u therapy and DME orders.  Also educated pt and family on team conference and that the pt D/C date would be set then and they would be alerted about D/C date ahead of time (family worried about being called day of D/C to come get him.)  Family verbalized understanding and stated they would try to be here for therapy with primary PT tomorrow.  Also encouraged family to contact SW tomorrow for further D/C questions.     Therapy Documentation Precautions:  Precautions Precautions: Fall Precaution Comments: 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 Restrictions Weight  Bearing Restrictions: No Vital Signs: Therapy Vitals Temp: 97.6 F (36.4 C) Temp src: Oral Pulse Rate: 68 Resp: 18 BP: 132/78 mmHg Patient Position, if appropriate: Lying Oxygen Therapy SpO2: 97 % O2 Device: None (Room air) Pain: Pain Assessment Pain Assessment: No/denies pain Locomotion : Ambulation Ambulation/Gait Assistance: 4: Min assist   See FIM for current functional status  Therapy/Group: Individual Therapy  Malachy Mood 03/31/2014, 4:14 PM

## 2014-04-01 ENCOUNTER — Inpatient Hospital Stay (HOSPITAL_COMMUNITY): Payer: Medicare Other | Admitting: Speech Pathology

## 2014-04-01 ENCOUNTER — Inpatient Hospital Stay (HOSPITAL_COMMUNITY): Payer: Medicare Other | Admitting: Rehabilitation

## 2014-04-01 ENCOUNTER — Encounter (HOSPITAL_COMMUNITY): Payer: Medicare Other

## 2014-04-01 ENCOUNTER — Encounter (HOSPITAL_COMMUNITY): Payer: Medicare Other | Admitting: Occupational Therapy

## 2014-04-01 DIAGNOSIS — I6350 Cerebral infarction due to unspecified occlusion or stenosis of unspecified cerebral artery: Secondary | ICD-10-CM

## 2014-04-01 DIAGNOSIS — G8110 Spastic hemiplegia affecting unspecified side: Secondary | ICD-10-CM

## 2014-04-01 NOTE — Progress Notes (Signed)
63 y.o. 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 Patient independent prior to admission living alone. Admitted 03/25/2014 with right-sided weakness and dysarthria. 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 Echocardiogram with ejection fraction 23% grade 1 diastolic dysfunction. Carotid Dopplers with no ICA stenosis.   Subjective/Complaints: Funny sensation Right arm and R knee, "felt like spasm"   No pain  Review of Systems - Negative except R arm weak  Objective: Vital Signs: Blood pressure 143/90, pulse 68, temperature 97.9 F (36.6 C), temperature source Oral, resp. rate 18, height 6\' 1"  (1.854 m), weight 75 kg (165 lb 5.5 oz), SpO2 96.00%. No results found. No results found for this or any previous visit (from the past 72 hour(s)).    Physical Exam  Vitals reviewed.  Eyes: EOM are normal.  Neck: Normal range of motion. Neck supple. No thyromegaly present.  Cardiovascular: Normal rate and regular rhythm.  Respiratory: Effort normal and breath sounds normal. No respiratory distress.  GI: Soft. Bowel sounds are normal. He exhibits no distension.  Neurological: He is alert.  Right-sided facial weakness. Speech is mildly dysarthric  He was oriented x3 and follows simple commands. RUE is trace deltoid/pec and 0/5 distally. RLE 4 HF and KE,Right hamstrings 3-/5 Left side normal. Sensation normal to LT in BUEs, reduced at foot and ankle on Left. Good insight and awareness  Skin: Skin is warm and dry.  Right BKA site is well-healed/shaped  MSK: no pain with shoulder AAROM Psychiatric: He has a normal mood and affect. His behavior is normal. Thought content normal    Assessment/Plan: 1. Functional deficits secondary to Left pontine  infarct with R HP which require 3+ hours per day of interdisciplinary therapy in a comprehensive inpatient rehab setting. Physiatrist is providing close team supervision and 24 hour management of active medical problems listed below. Physiatrist and rehab team continue to assess barriers to discharge/monitor patient progress toward functional and medical goals. FIM: FIM - Bathing Bathing Steps Patient Completed: Chest;Right Arm;Left Arm;Abdomen;Front perineal area;Right upper leg;Left upper leg Bathing: 0: Activity did not occur (patient refused)  FIM - Upper Body Dressing/Undressing Upper body dressing/undressing steps patient completed: Thread/unthread right sleeve of pullover shirt/dresss;Pull shirt over trunk;Put head through opening of pull over shirt/dress;Thread/unthread left sleeve of pullover shirt/dress Upper body dressing/undressing: 0: Activity did not occur (patient refused) FIM - Lower Body Dressing/Undressing Lower body dressing/undressing steps patient completed: Thread/unthread right underwear leg;Thread/unthread left underwear leg;Pull underwear up/down;Thread/unthread right pants leg;Thread/unthread left pants leg;Don/Doff left sock;Don/Doff left shoe Lower body dressing/undressing: 0: Activity did not occur (patient refused)  FIM - Toileting Toileting steps completed by patient: Adjust clothing prior to toileting;Performs perineal hygiene;Adjust clothing after toileting Toileting Assistive Devices: Grab bar or rail for support Toileting: 6: More than reasonable amount of time  FIM - Radio producer Devices: Grab bars;Elevated toilet seat Toilet Transfers: 0-Activity did not occur  FIM - Control and instrumentation engineer Devices: Bed rails;Arm rests;Walker;Prosthesis Bed/Chair Transfer: 4: Supine > Sit: Min A (steadying Pt. > 75%/lift 1 leg);4: Bed > Chair or W/C: Min A (steadying Pt. > 75%);4: Chair or W/C > Bed: Min A (steadying  Pt. > 75%)  FIM - Locomotion: Wheelchair Distance: 70 Locomotion: Wheelchair: 1: Total Assistance/staff  pushes wheelchair (Pt<25%) FIM - Locomotion: Ambulation Locomotion: Ambulation Assistive Devices: Walker - Hemi;Prosthesis (R prosthesis) Ambulation/Gait Assistance: 4: Min assist Locomotion: Ambulation: 1: Travels less than 50 ft with minimal assistance (Pt.>75%)  Comprehension Comprehension Mode: Auditory Comprehension: 5-Understands complex 90% of the time/Cues < 10% of the time  Expression Expression Mode: Verbal Expression: 5-Expresses complex 90% of the time/cues < 10% of the time  Social Interaction Social Interaction: 6-Interacts appropriately with others with medication or extra time (anti-anxiety, antidepressant).  Problem Solving Problem Solving: 5-Solves complex 90% of the time/cues < 10% of the time  Memory Memory: 5-Recognizes or recalls 90% of the time/requires cueing < 10% of the time   Medical Problem List and Plan:  1. Thrombotic left pontine infarct  2. DVT Prophylaxis/Anticoagulation: Subcutaneous heparin. Monitor platelet counts and any signs of bleeding  3. Pain Management: Tylenol as needed  4. 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 Patient with prosthesis provided by [employer]  5. Neuropsych: This patient is capable of making decisions on his own behalf.  6. Hypertension. No current antihypertensive medication. Patient on Benicar 40 mg daily prior to admission. Monitor with increased mobility  7. Tobacco abuse. NicoDerm patch. Provide counseling    LOS (Days) 4 A FACE TO FACE EVALUATION WAS PERFORMED  Charlett Blake 04/01/2014, 7:21 AM

## 2014-04-01 NOTE — Progress Notes (Signed)
Occupational Therapy Session Note  Patient Details  Name: Jordan Arellano MRN: [id number] Date of Birth: [birth year]  Today's Date: 04/01/2014 Time: 1030-1125 Time Calculation (min): 55 min  Short Term Goals: Week 1:  OT Short Term Goal 1 (Week 1): Pt will use the RUE as a stabilizer with mod assist during selfcare tasks. OT Short Term Goal 2 (Week 1): Pt will perform RUE AAROM exercises with independence following handout. OT Short Term Goal 3 (Week 1): Pt will perform UB bathing and dressing with supervision, OT Short Term Goal 4 (Week 1): Pt will perform all LB bathing and dressing with supervision including donning right prosthetic.  OT Short Term Goal 5 (Week 1): Pt will perform toilet transfers with supervision using assistive device to elevated toilet or 3:1.  Skilled Therapeutic Interventions/Progress Updates:    1:1 Neuromuscular reeducation: with focus on transfer training/ transitional movements with stand pivot transfer, sit<>stand and sit<>supine,  and postural alignment in sitting.  Neuromuscular facilitation of right UE with focus on normal patterns of movement.  Performed assisted PNF patterns in different planes in sitting, supine and sidelying. Pt with active scapular and shoulder movement; however with continues to have less active movement distally. Also performed heavy weight bearing in sitting EOB with functional reaching with left UE  Therapy Documentation Precautions:  Precautions Precautions: Fall Precaution Comments: 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 Restrictions Weight Bearing Restrictions: No Pain:  no c/o pain   See FIM for current functional status  Therapy/Group: Individual Therapy  Merrilee Seashore 04/01/2014, 1:54 PM

## 2014-04-01 NOTE — Progress Notes (Signed)
Physical Therapy Session Note  Patient Details  Name: Jordan Arellano MRN: [id number] Date of Birth: [birth year]  Today's Date: 04/01/2014 Time: 0930-1029 Time Calculation (min): 59 min  Short Term Goals: Week 1:  PT Short Term Goal 1 (Week 1): Pt will perform bed mobility to the R or L with attention and safety to RUE at min assist level  PT Short Term Goal 2 (Week 1): Pt will perform stand pivot transfer with LRAD at min/guard level with safe placement of device PT Short Term Goal 3 (Week 1): Pt will perform dynamic standing balance at min assist PT Short Term Goal 4 (Week 1): Pt will perform w/c mobility using L hemi technique x 150' at supervision level PT Short Term Goal 5 (Week 1): Pt will perform gait with LRAD x 150' at min/guard assist  Skilled Therapeutic Interventions/Progress Updates:   Pt received lying in bed with daughter in law present in room.  She had many questions regarding D/C date, expected outcomes, equipment needs, and follow up therapy.  Discussed that we have suggested that he stay here for 2 weeks in order to reach a Mod I level and that this would mean that he could be home alone.  Note that they are able to help intermittently, however not 24/7.  Daughter in law states that they could arrange to stay there for a few days initially and PT agrees that this would be a safe option to see how pt does and ask any questions that he may have or problem solve with family prior to them leaving.  Also discussed that PT would recommend outpatient therapy at D/C to further progress him with safety and balance and that we would order what equipment he would need prior to DC.  Ended discussion with communication regarding sling for RUE.  PT states that he should not wear regular sling in order to avoid immobilization, however could trial GiveMohr sling which would be something he would need to purchase on his own (wrote name down for daughter in law).  Daughter in law and pt verbalized  understanding and are in agreement.  Pt performed bed mobility with HOB flat and without rails at min assist.  Requires mod cues for log rolling technique, however did note that he did much better with attending to [place].  Once at EOB, had pt don shoes, which he was able to do on his own at supervision level, but did require assist for tying shoes.  Discussed that OT could provide pt with button to assist with this for one hand use.  Also had pt don prosthetic sleeve and prosthesis.  He was able to perform with very light assist from therapist and will continue to work on pt being able to perform on his own.  Transferred bed to wc with hemi walker at min assist level with mod cues for sequencing and technique.  Pt self propelled to/from gym x 150' using L hemi technique at supervision level.  Once in gym, trialed [employer]r sling with gait while using hemi walker.  Pt able to ambulate at min assist and states that sling provides support.  Then used [facility] for gait and also requires min assist for gait, therefore will likely continue to work towards using cane.  Performed 2 reps of 87'.  Performed 5 reps of sit <> stand with small block under LLE in order to increase weight shift and WB through RLE during standing as he tends to shift towards the L during standing.  Then had pt progress to placing B hands on R knee when standing to further increased weight shift.  Requires mod assist for standing in this manner, but did note improved WB.  Ended session with seated and standing kinetron activity with moderate resistance.  Performed 3 mins of each with facilitation for increased R weight shift and cues for controlled movement and for increased glute activation during R stance.  Pt transferred back to chair and returned to room as stated above.  All needs in reach.    Therapy Documentation Precautions:  Precautions Precautions: Fall Precaution Comments: R UE/LE hemiparesis with h/o R BKA w/  prosthesis Restrictions Weight Bearing Restrictions: No   Vital Signs: Therapy Vitals Temp: 97.9 F (36.6 C) Temp src: Oral Pulse Rate: 68 Resp: 18 BP: 143/90 mmHg Patient Position, if appropriate: Lying Oxygen Therapy SpO2: 96 % O2 Device: None (Room air) Pain: Pain Assessment Pain Assessment: No/denies pain Pain Score: 0-No pain   Locomotion : Ambulation Ambulation/Gait Assistance: 4: Min assist Wheelchair Mobility Distance: 150   See FIM for current functional status  Therapy/Group: Individual Therapy  [name] 04/01/2014, 10:31 AM

## 2014-04-01 NOTE — Progress Notes (Signed)
Physical Therapy Session Note  Patient Details  Name: Jordan Arellano MRN: [id number] Date of Birth: 1951-04-28  Today's Date: 04/01/2014 Time: 4270-6237 Time Calculation (min): 27 min  Short Term Goals: Week 1:  PT Short Term Goal 1 (Week 1): Pt will perform bed mobility to the R or L with attention and safety to RUE at min assist level  PT Short Term Goal 2 (Week 1): Pt will perform stand pivot transfer with LRAD at min/guard level with safe placement of device PT Short Term Goal 3 (Week 1): Pt will perform dynamic standing balance at min assist PT Short Term Goal 4 (Week 1): Pt will perform w/c mobility using L hemi technique x 150' at supervision level PT Short Term Goal 5 (Week 1): Pt will perform gait with LRAD x 150' at min/guard assist  Skilled Therapeutic Interventions/Progress Updates:   Pt received sitting in w/c in room, agreeable to therapy.  Pt ambulated >100' to ADL apt with [facility] at min/guard to min assist with cues for upright posture,  Increased weight shift to the R, and safe placement of LBQC.  Pt doing very well with gait and was able to ambulate entire distance without rest break.  Once in ADL apt, RN joined in session for bed mobility and transfers with use of LBQC.  PT performed first with pt x 2 reps in order to provide verbal cues for improved use of log roll technique.  Also performed stand pivot transfer with PT first then with RN x 2 reps with cues for hand placement.  Pt self propelled back to room using L hemi technique at supervision level.  Pt left in w/c with all needs in reach.    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    Locomotion : Ambulation Ambulation/Gait Assistance: 4: Min assist   See FIM for current functional status  Therapy/Group: Individual Therapy  [name] 04/01/2014, 4:39 PM

## 2014-04-01 NOTE — Progress Notes (Signed)
Speech Language Pathology Daily Session Note  Patient Details  Name: Jordan Arellano MRN: [id number] Date of Birth: [birth year]  Today's Date: 04/01/2014 Time: 0881-1031 Time Calculation (min): 45 min  Short Term Goals: Week 1: SLP Short Term Goal 1 (Week 1): STG=LTG  Skilled Therapeutic Interventions: Skilled treatment session focused on addressing cognitive-linguistic goals.  SLP facilitated session with scavenger hunt and new learning task.  Patient was Mod I with use of external aids for both tasks.  Patient was 100% intelligible during session.  SLP reinforced use of oral motor exercise for range or motion and strength; patient performed Mod I as well as use of written aids to assist with recall; patient verbalized understanding of information.  Recommend discharge for SLP services.      FIM:  Comprehension Comprehension Mode: Auditory Comprehension: 6-Follows complex conversation/direction: With extra time/assistive device Expression Expression Mode: Verbal Expression: 6-Expresses complex ideas: With extra time/assistive device Social Interaction Social Interaction: 6-Interacts appropriately with others with medication or extra time (anti-anxiety, antidepressant). Problem Solving Problem Solving: 6-Solves complex problems: With extra time Memory Memory: 6-Assistive device: No helper  Pain Pain Assessment Pain Assessment: No/denies pain  Therapy/Group: Individual Therapy  Carmelia Roller., [place]  Mora Appl 04/01/2014, 5:13 PM

## 2014-04-02 ENCOUNTER — Inpatient Hospital Stay (HOSPITAL_COMMUNITY): Payer: Medicare Other | Admitting: Occupational Therapy

## 2014-04-02 ENCOUNTER — Encounter (HOSPITAL_COMMUNITY): Payer: Medicare Other | Admitting: Occupational Therapy

## 2014-04-02 ENCOUNTER — Inpatient Hospital Stay (HOSPITAL_COMMUNITY): Payer: Medicare Other | Admitting: Rehabilitation

## 2014-04-02 NOTE — Progress Notes (Signed)
Speech Language Pathology Discharge Summary  Patient Details  Name: Jordan Arellano MRN: [id number] Date of Birth: Sep 11, 1951  Today's Date: 04/02/2014  Patient has met 2 of 2 long term goals.  Patient to discharge at overall Modified Independent level.  Reasons goals not met: n/a   Clinical Impression/Discharge Summary: Patient has made gains during this rehab stay and has met 2 out of 2 long term goals due to gains in speech intelligibility and use of memory compensatory strategies.  Patient is tolerating a regular texture diet and fully intelligible despite trace right sided oral weakness.  Patient requires set-up assist for external aids due to hemiparesis; however, once external aids are present patient is Mod I for recall of new information.   Patient reports being close to baseline and with use of external aids requires no skilled assist.    Care Partner:  Caregiver Able to Provide Assistance: Other (comment) (n/a)     Recommendation:  None      Equipment: none   Reasons for discharge: Treatment goals met   Patient/Family Agrees with Progress Made and Goals Achieved: Yes   See FIM for current functional status  Carmelia Roller., CCC-SLP [place] 04/02/2014, 8:33 AM

## 2014-04-02 NOTE — Progress Notes (Signed)
Physical Therapy Session Note  Patient Details  Name: Jordan Arellano MRN: [id number] Date of Birth: Jun 06, 1951  Today's Date: 04/02/2014 Time: 1300-1346 Time Calculation (min): 46 min  Short Term Goals: Week 1:  PT Short Term Goal 1 (Week 1): Pt will perform bed mobility to the R or L with attention and safety to RUE at min assist level  PT Short Term Goal 2 (Week 1): Pt will perform stand pivot transfer with LRAD at min/guard level with safe placement of device PT Short Term Goal 3 (Week 1): Pt will perform dynamic standing balance at min assist PT Short Term Goal 4 (Week 1): Pt will perform w/c mobility using L hemi technique x 150' at supervision level PT Short Term Goal 5 (Week 1): Pt will perform gait with LRAD x 150' at min/guard assist  Skilled Therapeutic Interventions/Progress Updates:   Pt received sitting in w/c in room, agreeable to therapy this afternoon.  Performed gait training to/from gym >150' with [facility] at min/guard to close supervision level.  Min cues for upright posture and increased weight shift to LLE.  Continue to note that pt keeps R hip retracted with gait, therefore performed several exercises during session to work on this.  Performed high level gait without AD forwards/backwards and side to side.  Note side to side tends to be difficult, however was able to perform all at min assist level.  Performed supine NMR for LLE with BLE bridging x 10 reps, RLE only bridging x 10 reps, hip/knee flex with BLE on small physioball x 10 reps and RLE only with min/guard to avoid hip IR/ER.  Then performed standing activity with use of mirror for increased visual feedback while advancing and retrostepping LLE to increase weight shift and WB through RLE.  Also worked on stepping and retro stepping RLE with pt working on forward translation of weight over RLE for increased glute activation.  Pt continues to demonstrate decreased glute activation during R stance phase.  Transitioned to  stepping LLE up/down to 6" step with and without UE support again with manual facilitation for forward weight shift of hips over RLE.  Pt then ambulated back to room as stated above with all needs left in reach.    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

## 2014-04-02 NOTE — Progress Notes (Signed)
Physical Therapy Session Note  Patient Details  Name: Jordan Arellano MRN: [id number] Date of Birth: [birth year]  Today's Date: 04/02/2014 Time: 0830-0929 Time Calculation (min): 59 min  Short Term Goals: Week 1:  PT Short Term Goal 1 (Week 1): Pt will perform bed mobility to the R or L with attention and safety to RUE at min assist level  PT Short Term Goal 2 (Week 1): Pt will perform stand pivot transfer with LRAD at min/guard level with safe placement of device PT Short Term Goal 3 (Week 1): Pt will perform dynamic standing balance at min assist PT Short Term Goal 4 (Week 1): Pt will perform w/c mobility using L hemi technique x 150' at supervision level PT Short Term Goal 5 (Week 1): Pt will perform gait with LRAD x 150' at min/guard assist  Skilled Therapeutic Interventions/Progress Updates:   Pt received sitting on EOB, agreeable to therapy.  Pt able to don prosthetic sleeve and prosthesis on his own today with supervision.  Note he states he is getting a new one delivered to floor while on rehab.  Pt able to ambulate >150' with [facility] to/from gym at min/guard level with min cues for upright posture (esp head) and increased R weight shift.  Once in therapy gym, performed tall kneeling activity while performing reaching activity to the R in order to increase R weight shift and R weight bearing.  Requires min to mod facilitation for full R weight shift as he tends to keep R hip retracted when in R stance.  Performed 3 reps of approx 4-5 mins each with popped on elbows during rest breaks.  Pt able to easily transition into SL>sitting.  Progressed to short period of quadruped while advancing and retro placing LUE to increase WB through RUE.  Requires assist at shoulder and elbow for support but tolerated well.  Pt then ambulated to kinetron in order to work on seated BLE NMR for increased hip extension on RLE, progressing to standing with focus on increased glute activation during R stance, all at  moderate resistance with min assist for facilitation.  Ended session with negotiation around and over obstacles with [facility] at min assist level with mod verbal cues for sequencing and technique for increased safety with task.  Ambulated back to room as stated above and returned to w/c.  All needs in reach.    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    Locomotion : Ambulation Ambulation/Gait Assistance: 4: Min assist  See FIM for current functional status  Therapy/Group: Individual Therapy  [name] 04/02/2014, 12:10 PM

## 2014-04-02 NOTE — Consult Note (Signed)
NEUROBEHAVIORAL STATUS EXAM - [facility]   MEDICAL NECESSITY:  [name] was seen on the [facility] for a neurobehavioral status exam owing to the patient's diagnosis of stroke.   According to medical records, 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 He has a prosthetic limb. He was reportedly admitted on 03/25/2014 with right-sided weakness and dysarthria. 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   During today's visit, [name] was accompanied by his brother for a portion of the appointment.  Neither of them has noticed any blatant cognitive changes post-stroke. He suffers from right upper extremity paresis and lower right extremity weakness. He described himself to be in generally good spirits with no history of mental health treatment or mood issues. He is purportedly adjusting well to this admission.   [name] feels that he is making strides in therapy and the staff has been pleasant and supportive. His son, daughter-in-law, and the patient's brother have all been good social supports. [name] currently collects disability benefits. He has no concern about transitioning home.   PROCEDURES ADMINISTERED: [2 units [id number] on 04/01/14] Diagnostic clinical interview  Review of available records Mental Status Exam-2 (brief version) Beck Depression Inventory - Fast Screen  MENTAL STATUS: [name] mental status exam score of 13/16 is below expectation but above the cutoff used to indicate blatant dementia.  He was fully oriented to person, place, time and situation with the exception of the floor of the building on which we were located. He was able to immediately register 3 words into memory but only freely recall one of them after a very short delay. With recognition cueing he recalled one more word.   Emotional & Behavioral Evaluation: [name] was appropriately dressed for season and situation, and he appeared tidy and well-groomed. Normal posture was noted. He was friendly and rapport was easily established. His speech was as expected and he was able to express ideas effectively. He seemed to understand test directions readily. His affect was somewhat blunted.  Attention and motivation were good. Optimal test taking conditions were maintained.  From an emotional standpoint, on a brief self-report inventory, [name] denied suffering from any major signs of depression. He denies anxiety. He is adjusting well to this admission. Suicidal/homicidal ideation, plan or intent was denied. No manic or hypomanic episodes were reported. The patient denied ever experiencing any auditory/visual hallucinations. No major behavioral or personality changes were endorsed.    Overall, [name] denied suffering from any major cognitive or emotional symptoms post-stroke but it seems that there is some cognitive residual likely present and he could benefit from neuropsychological testing. However, this may best be accomplished as an outpatient as they are likely subtle in nature. Stroke education was also provided during this visit and he was given time to ask questions. When he indicated that he had no more questions, the session was concluded.    RECOMMENDATIONS    No routine follow-up appears warranted at this time. He was encouraged to call upon [place] if new symptoms arise.    Complete  a neuropsychological evaluation 2-3 months post discharge.   DIAGNOSIS: Cerebral infarction    Rutha Bouchard, Psy.D.   Clinical Neuropsychologist

## 2014-04-02 NOTE — Progress Notes (Signed)
Occupational Therapy Session Note  Patient Details  Name: Jordan Arellano MRN: [id number] Date of Birth: 04-13-1951  Today's Date: 04/02/2014 Time: 1431-1500 Time Calculation (min): 29 min  Skilled Therapeutic Interventions/Progress Updates:   Pt transferred to the therapy mat with min assist stand pivot.  He was able to work on RUE functional movement and weightbearing tasks.  Utilized therapy board and had pt maintain contact with the board as therapist provided mod facilitation with slight movements in shoulder flexion, elbow extension, internal rotation, and external rotation.  Pt progressed to balancing ball on therapy board and perform small active movements of shoulder internal and external rotation.  Completed session with use of tilted stood for shoulder flexion and elbow extension.  He needed min facilitation to avoid compensatory strategies with his trunk during this task.    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

## 2014-04-02 NOTE — Progress Notes (Signed)
63 y.o. 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 Patient independent prior to admission living alone. Admitted 03/25/2014 with right-sided weakness and dysarthria. 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 Echocardiogram with ejection fraction 81% grade 1 diastolic dysfunction. Carotid Dopplers with no ICA stenosis.   Subjective/Complaints: Pt states BK prosthetic liner "worn out"  No other c/os Silicone liner inspected, 1.5 cm split along seam  Review of Systems - Negative except R arm weak  Objective: Vital Signs: Blood pressure 115/77, pulse 75, temperature 97.9 F (36.6 C), temperature source Oral, resp. rate 16, height 6\' 1"  (1.854 m), weight 75 kg (165 lb 5.5 oz), SpO2 96.00%. No results found. No results found for this or any previous visit (from the past 72 hour(s)).    Physical Exam  Vitals reviewed.  Eyes: EOM are normal.  Neck: Normal range of motion. Neck supple. No thyromegaly present.  Cardiovascular: Normal rate and regular rhythm.  Respiratory: Effort normal and breath sounds normal. No respiratory distress.  GI: Soft. Bowel sounds are normal. He exhibits no distension.  Neurological: He is alert.  Right-sided facial weakness. Speech is mildly dysarthric  He was oriented x3 and follows simple commands. RUE is trace deltoid/pec and 0/5 distally. RLE 4 HF and KE,Right hamstrings 3-/5 Left side normal. Sensation normal to LT in BUEs, reduced at foot and ankle on Left. Good insight and awareness  Skin: Skin is warm and dry.  Right BKA site is well-healed/shaped  MSK: no pain with shoulder AAROM Psychiatric: He has a normal mood and affect. His behavior is normal. Thought content normal    Assessment/Plan: 1.  Functional deficits secondary to Left pontine infarct with R HP which require 3+ hours per day of interdisciplinary therapy in a comprehensive inpatient rehab setting. Physiatrist is providing close team supervision and 24 hour management of active medical problems listed below. Physiatrist and rehab team continue to assess barriers to discharge/monitor patient progress toward functional and medical goals. FIM: FIM - Bathing Bathing Steps Patient Completed: Chest;Right Arm;Left Arm;Abdomen;Front perineal area;Right upper leg;Left upper leg Bathing: 0: Activity did not occur (patient refused)  FIM - Upper Body Dressing/Undressing Upper body dressing/undressing steps patient completed: Thread/unthread right sleeve of pullover shirt/dresss;Pull shirt over trunk;Put head through opening of pull over shirt/dress;Thread/unthread left sleeve of pullover shirt/dress Upper body dressing/undressing: 0: Activity did not occur (patient refused) FIM - Lower Body Dressing/Undressing Lower body dressing/undressing steps patient completed: Thread/unthread right underwear leg;Thread/unthread left underwear leg;Pull underwear up/down;Thread/unthread right pants leg;Thread/unthread left pants leg;Don/Doff left sock;Don/Doff left shoe Lower body dressing/undressing: 0: Activity did not occur (patient refused)  FIM - Toileting Toileting steps completed by patient: Adjust clothing prior to toileting;Performs perineal hygiene;Adjust clothing after toileting Toileting Assistive Devices: Grab bar or rail for support Toileting: 6: More than reasonable amount of time  FIM - Radio producer Devices: Grab bars;Elevated toilet seat Toilet Transfers: 0-Activity did not occur  FIM - Control and instrumentation engineer Devices: Bed rails;Arm rests;Walker;Prosthesis Bed/Chair Transfer: 5: Supine > Sit: Supervision (verbal cues/safety issues);5: Sit > Supine: Supervision (verbal  cues/safety issues);4: Bed > Chair or W/C: Min A (steadying Pt. > 75%);4: Chair or W/C > Bed: Min A (steadying Pt. > 75%)  FIM - Locomotion:  Wheelchair Distance: 150 Locomotion: Wheelchair: 0: Activity did not occur FIM - Locomotion: Ambulation Locomotion: Ambulation Assistive Devices: Walker - Hemi;Prosthesis;Cane - [employer] Ambulation/Gait Assistance: 4: Min assist Locomotion: Ambulation: 2: Travels 50 - 149 ft with minimal assistance (Pt.>75%)  Comprehension Comprehension Mode: Auditory Comprehension: 5-Understands complex 90% of the time/Cues < 10% of the time  Expression Expression Mode: Verbal Expression: 5-Expresses complex 90% of the time/cues < 10% of the time  Social Interaction Social Interaction: 6-Interacts appropriately with others with medication or extra time (anti-anxiety, antidepressant).  Problem Solving Problem Solving: 5-Solves complex 90% of the time/cues < 10% of the time  Memory Memory: 5-Recognizes or recalls 90% of the time/requires cueing < 10% of the time   Medical Problem List and Plan:  1. Thrombotic left pontine infarct  2. DVT Prophylaxis/Anticoagulation: Subcutaneous heparin. Monitor platelet counts and any signs of bleeding  3. Pain Management: Tylenol as needed  4. 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 Patient with prosthesis provided by [employer]  5. Neuropsych: This patient is capable of making decisions on his own behalf.  6. Hypertension. No current antihypertensive medication. Patient on Benicar 40 mg daily prior to admission. Monitor with increased mobility  7. Tobacco abuse. NicoDerm patch. Provide counseling    LOS (Days) 5 A FACE TO FACE EVALUATION WAS PERFORMED  Charlett Blake 04/02/2014, 7:12 AM

## 2014-04-02 NOTE — Progress Notes (Signed)
Occupational Therapy Session Note  Patient Details  Name: Jordan Arellano MRN: [id number] Date of Birth: 03/11/1951  Today's Date: 04/02/2014 Time: 1103-1200 Time Calculation (min): 57 min  Short Term Goals: Week 1:  OT Short Term Goal 1 (Week 1): Pt will use the RUE as a stabilizer with mod assist during selfcare tasks. OT Short Term Goal 2 (Week 1): Pt will perform RUE AAROM exercises with independence following handout. OT Short Term Goal 3 (Week 1): Pt will perform UB bathing and dressing with supervision, OT Short Term Goal 4 (Week 1): Pt will perform all LB bathing and dressing with supervision including donning right prosthetic.  OT Short Term Goal 5 (Week 1): Pt will perform toilet transfers with supervision using assistive device to elevated toilet or 3:1.  Skilled Therapeutic Interventions/Progress Updates:    Pt performed bathing and dressing to begin session. He was able to ambulate from his wheelchair to the shower bench using his quad cane with min guard assist.  He showered sit to stand with min assist as well.  Utilized the RUE with max hand over hand assist for washing the left arm but therapist also educated the pt on hemi bathing techniques.  Transferred squat pivot back to the wheelchair with min assist, without prosthesis on the RLE.  He rolled to the sink and perform dressing with overall min assist as well.  Therapist placed shoe button on the left shoe to assist with tying.  Finished session down in the gym sitting on the mat with emphasis on closed chain weightbearing in the RUE while working on lateral weightshifts and activation of the right trunk to assist with sidelying to sit.    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

## 2014-04-03 ENCOUNTER — Encounter (HOSPITAL_COMMUNITY): Payer: Medicare Other | Admitting: Occupational Therapy

## 2014-04-03 ENCOUNTER — Inpatient Hospital Stay (HOSPITAL_COMMUNITY): Payer: Medicare Other | Admitting: Rehabilitation

## 2014-04-03 ENCOUNTER — Inpatient Hospital Stay (HOSPITAL_COMMUNITY): Payer: Medicare Other | Admitting: Occupational Therapy

## 2014-04-03 MED ORDER — SENNOSIDES-DOCUSATE SODIUM 8.6-50 MG PO TABS
2.0000 | ORAL_TABLET | Freq: Every day | ORAL | Status: DC
Start: 2014-04-03 — End: 2014-04-08
  Administered 2014-04-03 – 2014-04-07 (×5): 2 via ORAL
  Filled 2014-04-03 (×7): qty 2

## 2014-04-03 NOTE — Progress Notes (Signed)
Social Work Patient ID: Jordan Arellano, male   DOB: 1951-07-26, 63 y.o.   MRN: [id number] Met with pt to inform team conference goals-supervision/min level and discharge 5/4.  Pt is pleased with how well he is doing and mood is better seeing his progress. Will discuss DME and follow up therapies.  Pt is agreeable to plan.

## 2014-04-03 NOTE — Progress Notes (Signed)
63 y.o. 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 Patient independent prior to admission living alone. Admitted 03/25/2014 with right-sided weakness and dysarthria. 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 Echocardiogram with ejection fraction 47% grade 1 diastolic dysfunction. Carotid Dopplers with no ICA stenosis.   Subjective/Complaints: Pt states BK prosthetic liner "worn out"  No other c/os Silicone liner inspected, 1.5 cm split along seam  Review of Systems - Negative except R arm weak  Objective: Vital Signs: Blood pressure 139/81, pulse 78, temperature 97.9 F (36.6 C), temperature source Oral, resp. rate 18, height 6\' 1"  (1.854 m), weight 75 kg (165 lb 5.5 oz), SpO2 96.00%. No results found. No results found for this or any previous visit (from the past 72 hour(s)).    Physical Exam  Vitals reviewed.  Eyes: EOM are normal.  Neck: Normal range of motion. Neck supple. No thyromegaly present.  Cardiovascular: Normal rate and regular rhythm.  Respiratory: Effort normal and breath sounds normal. No respiratory distress.  GI: Soft. Bowel sounds are normal. He exhibits no distension.  Neurological: He is alert.  Right-sided facial weakness. Speech is mildly dysarthric  He was oriented x3 and follows simple commands. RUE is trace deltoid/pec and 0/5 distally. RLE 4 HF and KE,Right hamstrings 3-/5 Left side normal. Sensation normal to LT in BUEs, reduced at foot and ankle on Left. Good insight and awareness  Skin: Skin is warm and dry.  Right BKA site is well-healed/shaped  MSK: no pain with shoulder AAROM Psychiatric: He has a normal mood and affect. His behavior is normal. Thought content normal    Assessment/Plan: 1.  Functional deficits secondary to Left pontine infarct with R HP which require 3+ hours per day of interdisciplinary therapy in a comprehensive inpatient rehab setting. Physiatrist is providing close team supervision and 24 hour management of active medical problems listed below. Physiatrist and rehab team continue to assess barriers to discharge/monitor patient progress toward functional and medical goals. FIM: FIM - Bathing Bathing Steps Patient Completed: Chest;Right Arm;Abdomen;Front perineal area;Buttocks;Right upper leg;Left upper leg;Left lower leg (including foot) Bathing: 4: Steadying assist  FIM - Upper Body Dressing/Undressing Upper body dressing/undressing steps patient completed: Put head through opening of pull over shirt/dress;Thread/unthread left sleeve of pullover shirt/dress;Thread/unthread right sleeve of pullover shirt/dresss;Pull shirt over trunk Upper body dressing/undressing: 5: Supervision: Safety issues/verbal cues FIM - Lower Body Dressing/Undressing Lower body dressing/undressing steps patient completed: Thread/unthread right underwear leg;Thread/unthread left underwear leg;Pull underwear up/down;Thread/unthread right pants leg;Thread/unthread left pants leg;Pull pants up/down;Don/Doff left sock;Don/Doff left shoe Lower body dressing/undressing: 4: Min-Patient completed 75 plus % of tasks  FIM - Toileting Toileting steps completed by patient: Adjust clothing prior to toileting;Performs perineal hygiene;Adjust clothing after toileting Toileting Assistive Devices: Grab bar or rail for support Toileting: 6: More than reasonable amount of time  FIM - Radio producer Devices: Grab bars;Elevated toilet seat Toilet Transfers: 0-Activity did not occur  FIM - Control and instrumentation engineer Devices: Arm rests;Walker;Prosthesis Bed/Chair Transfer: 5: Supine > Sit: Supervision (verbal cues/safety issues)  FIM - Locomotion:  Wheelchair Distance: 150 Locomotion: Wheelchair: 0: Activity did not occur FIM - Locomotion: Ambulation Locomotion: Ambulation Assistive Devices: Prosthesis;Holland Falling Ambulation/Gait Assistance: 4: Min assist Locomotion: Ambulation: 4: [employer]  150 ft or more with minimal assistance (Pt.>75%)  Comprehension Comprehension Mode: Auditory Comprehension: 5-Understands complex 90% of the time/Cues < 10% of the time  Expression Expression Mode: Verbal Expression: 5-Expresses complex 90% of the time/cues < 10% of the time  Social Interaction Social Interaction: 6-Interacts appropriately with others with medication or extra time (anti-anxiety, antidepressant).  Problem Solving Problem Solving: 5-Solves complex 90% of the time/cues < 10% of the time  Memory Memory: 5-Recognizes or recalls 90% of the time/requires cueing < 10% of the time   Medical Problem List and Plan:  1. Thrombotic left pontine infarct  2. DVT Prophylaxis/Anticoagulation: Subcutaneous heparin. Monitor platelet counts and any signs of bleeding  3. Pain Management: Tylenol as needed  4. 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 Patient with prosthesis provided by [employer] prost[employer]  5. Neuropsych: This patient is capable of making decisions on his own behalf.  6. Hypertension. No current antihypertensive medication. Patient on Benicar 40 mg daily prior to admission. Monitor with increased mobility  7. Tobacco abuse. NicoDerm patch. Provide counseling    LOS (Days) 6 A FACE TO FACE EVALUATION WAS PERFORMED  Charlett Blake 04/03/2014, 7:52 AM

## 2014-04-03 NOTE — Patient Care Conference (Signed)
Inpatient RehabilitationTeam Conference and Plan of Care Update Date: 04/03/2014   Time: 11:25 AM    Patient Name: Jordan Arellano      Medical Record Number: [id number]  Date of Birth: 02/13/1951 Sex: Male         Room/Bed: 4M09C/4M09C-01 Payor Info: Payor: Marine scientist / Plan: AARP MEDICARE COMPLETE / Product Type: *No Product type* /    Admitting Diagnosis: L Pontine CVA w/hx of R BKA  Admit Date/Time:  03/28/2014  5:44 PM Admission Comments: No comment available   Primary Diagnosis:  <principal problem not specified> Principal Problem: <principal problem not specified>  Patient Active Problem List   Diagnosis Date Noted  . CVA (cerebral infarction) 03/25/2014  . HTN (hypertension) 03/25/2014  . Tobacco abuse 03/25/2014    Expected Discharge Date: Expected Discharge Date: 04/08/14  Team Members Present: Physician leading conference: Dr. Alysia Penna Social Worker Present: Ovidio Kin, LCSW Nurse Present: Elliot Cousin, RN PT Present: Raylene Everts, PT;Emily Rinaldo Cloud, PT OT Present: Antony Salmon, Maryella Shivers, OT SLP Present: Gunnar Fusi, SLP PPS Coordinator present : Ileana Ladd, Lelan Pons, RN, CRRN     Current Status/Progress Goal Weekly Team Focus  Medical   Right upper extremity weakness severe, mild shoulder pain, right BKA prosthetic wear and tear  Upgrade prosthesis with new silicone line  See above   Bowel/Bladder   continent of b+b         Swallow/Nutrition/ Hydration     na        ADL's   Pt is currently supervision for UB selfcare and min assist for LB selfcare sit to stand.  He demonstrates Brunnstrum stage II movement in the RUE at this time.  He needs max assist to facilitate functional use of the RUE with bathing tasks.   supervision to modified independent level  selfcare re-training, balance re-training, neuromuscular re-education,  pt education, exercises   Mobility   Pt is currenlty min assist to supervision for bed  mobility, min assist for transfers and min assist for gait with LBQC  Mod I overall  balance, gait, transfers, NMR for RUE/LE, pt/family education   Communication     na        Safety/Cognition/ Behavioral Observations    no unsafe behaviors        Pain   n/a         Skin   n/a            *See Care Plan and progress notes for long and short-term goals.  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  Affect much brighter.  Medically stable  Revisions to Treatment Plan:  None   Continued Need for Acute Rehabilitation Level of Care: The patient requires daily medical management by a physician with specialized training in physical medicine and rehabilitation for the following conditions: Daily direction of a multidisciplinary physical rehabilitation program to ensure safe treatment while eliciting the highest outcome that is of practical value to the patient.: Yes Daily medical management of patient stability for increased activity during participation in an intensive rehabilitation regime.: Yes Daily analysis of laboratory values and/or radiology reports with any subsequent need for medication adjustment of medical intervention  for : Neurological problems;Other  Gardiner Rhyme Nicolai Labonte 04/03/2014, 1:47 PM

## 2014-04-03 NOTE — Progress Notes (Signed)
Physical Therapy Session Note  Patient Details  Name: Jordan Arellano MRN: [id number] Date of Birth: 1951/08/18  Today's Date: 04/03/2014 Time: 0830-0925 Time Calculation (min): 55 min  Short Term Goals: Week 1:  PT Short Term Goal 1 (Week 1): Pt will perform bed mobility to the R or L with attention and safety to RUE at min assist level  PT Short Term Goal 2 (Week 1): Pt will perform stand pivot transfer with LRAD at min/guard level with safe placement of device PT Short Term Goal 3 (Week 1): Pt will perform dynamic standing balance at min assist PT Short Term Goal 4 (Week 1): Pt will perform w/c mobility using L hemi technique x 150' at supervision level PT Short Term Goal 5 (Week 1): Pt will perform gait with LRAD x 150' at min/guard assist  Skilled Therapeutic Interventions/Progress Updates:   Pt received sitting at EOB, shoes and prosthesis donned (pt did at Mod I level).  Pt ambulated to/from gym with [facility] at min/guard to close supervision level >150' with continued intermittent cues for upright head/chest posture and increased weight shift to the R.  Once in gym, performed seated nustep x 5 mins with BLEs only to achieve increased NMR through RLE esp for increased glute activation.  Tolerated well but did have slight c/o fatigue in LLE.  Performed 5 reps sit <> stand with small block under LLE while using BUEs on RLE to further increase weight shift and weight bearing through RLE.  Requires mod assist initially and he continues to increase WB through LLE, however with repetition improved amount of WB through RLE.  Progressed to performing dynamic standing balance for weight shift and WB through RLE as well as trunk strengthening by reaching and placing horseshoes on basketball hoop.  Performed 2 reps of 4 mins each with facilitation for increased R trunk rotation and forward translation of hips over RLE.  Pt then ambulated to ortho gym in order to perform car transfer.  Performed car transfer at  supervision level with cues for technique and safety.  Then ambulated back to ADL apt in order to work on gait in carpeted environment, furniture transfer and also fall recovery.  Pt requires mod assist and mod verbal and demonstration cues for correct technique.  Ambulated back to room at supervision level with cues as mentioned above.  Pt left in w/c with all needs in reach and discussed that he can move above in w/c as needed, and on unit.  Pt verbalized understanding.    Therapy Documentation Precautions:  Precautions Precautions: Fall Precaution Comments: 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 Restrictions Weight Bearing Restrictions: No   Vital Signs: Therapy Vitals Temp: 97.9 F (36.6 C) Temp src: Oral Pulse Rate: 78 Resp: 18 BP: 139/81 mmHg Oxygen Therapy SpO2: 96 % O2 Device: None (Room air) Pain: no pain during session.    Locomotion : Ambulation Ambulation/Gait Assistance: 5: Supervision   See FIM for current functional status  Therapy/Group: Individual Therapy  Raquel Sarna A Calyn Sivils 04/03/2014, 9:27 AM

## 2014-04-03 NOTE — Progress Notes (Signed)
Occupational Therapy Session Note  Patient Details  Name: Jordan Arellano MRN: [id number] Date of Birth: 03-07-1951  Today's Date: 04/03/2014 Time: 4917-9150 Time Calculation (min): 30 min  Skilled Therapeutic Interventions/Progress Updates:    Pt worked on Brewing technologist for the RUE with use of the UE Ranger in sitting during session.  Focused on small movements of shoulder flexion, elbow extension, and shoulder internal and external rotation.  Pt exhibits the most strength at this time with internal rotation.  She needs mod assist to perform small movements of external rotation as well.   Mod compensation in the trunk and and shoulder with all movements but pt can initiate slight shoulder flexion movements in a graded plane.    Therapy Documentation Precautions:  Precautions Precautions: Fall Precaution Comments: 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 Restrictions Weight Bearing Restrictions: No  Pain: Pain Assessment Pain Assessment: No/denies pain ADL: See FIM for current functional status  Therapy/Group: Individual Therapy  Cindra Presume OTR/L 04/03/2014, 4:05 PM

## 2014-04-03 NOTE — Progress Notes (Signed)
Physical Therapy Session Note  Patient Details  Name: Jordan Arellano MRN: [id number] Date of Birth: March 29, 1951  Today's Date: 04/03/2014 Time: 7867-6720 Time Calculation (min): 43 min  Short Term Goals: Week 1:  PT Short Term Goal 1 (Week 1): Pt will perform bed mobility to the R or L with attention and safety to RUE at min assist level  PT Short Term Goal 2 (Week 1): Pt will perform stand pivot transfer with LRAD at min/guard level with safe placement of device PT Short Term Goal 3 (Week 1): Pt will perform dynamic standing balance at min assist PT Short Term Goal 4 (Week 1): Pt will perform w/c mobility using L hemi technique x 150' at supervision level PT Short Term Goal 5 (Week 1): Pt will perform gait with LRAD x 150' at min/guard assist  Skilled Therapeutic Interventions/Progress Updates:   Pt received sitting in w/c in room, agreeable to therapy.  Had pt self propel using L hemi technique to/from ortho gym area at supervision level.  Once in ortho gym, set up pt for use of LiteGait body weight support system in order to further work on gait training.  Pt able to ambulate total of 374' at .6 to .8 mph and total time of 6 mins with two standing rest breaks in between.  While performing gait, therapist assisted with increased weight shift to the R for increased forward translation over RLE.  Did note increased glute activation and pt with improved posture with increased forward hips.  Did require assist for weight shift to the L intermittent to increase clearance of RLE.  Assisted down off of treadmill in order to perform gait in controlled and carpeted environment to determine carryover from treadmill techniques.  Pt performed >150' gait with [facility] at supervision level.  Note improved posture and increased glute activation following treadmill training.  Then donned GiveMohr sling for improved support of RUE.  Ambulated another 62' in controlled and carpeted tight spaces in ADL apt to better  simulate home environment.  Able to perform at supervision level.  Pt self propelled back to room as stated above.  All needs in reach.   Therapy Documentation Precautions:  Precautions Precautions: Fall Precaution Comments: 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 Restrictions Weight Bearing Restrictions: No   Pain: Pain Assessment Pain Assessment: No/denies pain     See FIM for current functional status  Therapy/Group: Individual Therapy  Jordan Arellano 04/03/2014, 1:42 PM

## 2014-04-03 NOTE — Progress Notes (Signed)
Occupational Therapy Session Note  Patient Details  Name: Jordan Arellano MRN: [id number] Date of Birth: 05-28-1951  Today's Date: 04/03/2014 Time: 1000-1059 Time Calculation (min): 59 min  Short Term Goals: Week 1:  OT Short Term Goal 1 (Week 1): Pt will use the RUE as a stabilizer with mod assist during selfcare tasks. OT Short Term Goal 2 (Week 1): Pt will perform RUE AAROM exercises with independence following handout. OT Short Term Goal 3 (Week 1): Pt will perform UB bathing and dressing with supervision, OT Short Term Goal 4 (Week 1): Pt will perform all LB bathing and dressing with supervision including donning right prosthetic.  OT Short Term Goal 5 (Week 1): Pt will perform toilet transfers with supervision using assistive device to elevated toilet or 3:1.  Skilled Therapeutic Interventions/Progress Updates:    Pt performed bathing and dressing to begin session.  He was able to ambulate to the shower with the quad cane and prosthesis with min guard assist.  Performed bathing with overall supervision however he did not attempt washing anything other than his UB.  He felt his LB was ok for the day and didn't need it.  Transferred stand pivot to the wheelchair using the grab bar for support with min guard assist.  Dressing tasks performed sit to stand at the sink with min guard assist as well including sit to stand, which was performed with prosthesis donned.  Once finished with dressing took pt down to the gym to work on weightbearing tasks [place].  Began with pt sitting on the mat and working on stabilizing his RUE while reaching across his body to grasp clothespins.  Emphasis on pt to decrease head lateral flexion and trunk lateral flexion to the left while reaching and returning to midline thus increasing activation of trunk shortening on the right and use of the RUE to help him regain balance.  Pt able to perform with min assist to help decreased head and trunk compensations.   Finished session by having pt work on active shoulder flexion using tilted stool.  Pt able to tilt stool forward and back with less trunk compensation than during previous session.    Therapy Documentation Precautions:  Precautions Precautions: Fall Precaution Comments: 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 Restrictions Weight Bearing Restrictions: No  Pain: Pain Assessment Pain Assessment: No/denies pain ADL: See FIM for current functional status  Therapy/Group: Individual Therapy  Cindra Presume OTR/L 04/03/2014, 11:07 AM

## 2014-04-04 ENCOUNTER — Inpatient Hospital Stay (HOSPITAL_COMMUNITY): Payer: Medicare Other | Admitting: Rehabilitation

## 2014-04-04 ENCOUNTER — Encounter (HOSPITAL_COMMUNITY): Payer: Medicare Other | Admitting: Occupational Therapy

## 2014-04-04 ENCOUNTER — Inpatient Hospital Stay (HOSPITAL_COMMUNITY): Payer: Medicare Other | Admitting: Occupational Therapy

## 2014-04-04 DIAGNOSIS — S88119A Complete traumatic amputation at level between knee and ankle, unspecified lower leg, initial encounter: Secondary | ICD-10-CM

## 2014-04-04 DIAGNOSIS — G8110 Spastic hemiplegia affecting unspecified side: Secondary | ICD-10-CM

## 2014-04-04 NOTE — Progress Notes (Signed)
Physical Medicine and Rehabilitation Consult Reason for Consult: CVA Referring Physician: [facility]     HPI: Jordan Arellano is a 63 y.o. 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 Patient independent prior to admission living alone. Admitted 03/25/2014 with right-sided weakness and dysarthria. 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 Echocardiogram with ejection fraction 123456 grade 1 diastolic dysfunction. Carotid Dopplers are pending. Patient did not receive TPA. Placed on aspirin for CVA prophylaxis with subcutaneous Septra for DVT prophylaxis. NicoDerm patch placed for history of tobacco abuse. Tolerating a regular consistency diet. Occupational therapy evaluation completed 03/25/2014 with recommendations for physical medicine rehabilitation consult.     Review of Systems  Gastrointestinal: Positive for constipation.  Musculoskeletal: Positive for myalgias.  Neurological: Positive for speech change and weakness.  All other systems reviewed and are negative. Past Medical History   Diagnosis  Date   .  Hypertension     .  TIA (transient ischemic attack)     .  Stroke      Past Surgical History   Procedure  Laterality  Date   .  Ambutation       .  Below knee leg amputation  Right      Family History   Problem  Relation  Age of Onset   .  Hypertension  Mother     .  Hypertension  Father      Social History: reports that he has been smoking Cigarettes.  He has a 17.5 pack-year smoking history. He has never used smokeless tobacco. He reports that he does not drink alcohol or use illicit drugs. Allergies: No Known Allergies Medications Prior to Admission   Medication  Sig  Dispense  Refill   .  olmesartan (BENICAR) 40 MG tablet   Take 40 mg by mouth daily.            Home: Home Living Family/patient expects to be discharged to:: Private residence Living Arrangements: Alone Available Help at Discharge: Family;Available PRN/intermittently Type of Home: House Home Access: Stairs to enter [employer] of Steps: 2 in back; 4 in front Entrance Stairs-Rails:  (no rail in back; left rail in front) Home Layout: One level Home Equipment: Other (comment) (has a seat to use for shower)   Functional History: Prior Function Level of Independence: Independent Functional Status:   Mobility: Bed Mobility Overal bed mobility: Needs Assistance Bed Mobility: Supine to Sit Supine to sit: Mod assist General bed mobility comments: assistance with trunk; cues for technique. Transfers Overall transfer level: Needs assistance Transfers: Sit to/from Stand Sit to Stand: Max assist;From elevated surface General transfer comment: performed sit <> stand x2. Pt unsteady, but was able to finally let go of rail for brief period and hold OT's hand.   ADL: ADL Overall ADL's : Needs assistance/impaired Eating/Feeding: Set up;Supervision/ safety;Sitting Grooming: Minimal assistance;Sitting Upper Body Bathing: Moderate assistance;Sitting Lower Body Bathing: Moderate assistance;Bed level Upper Body Dressing : Moderate assistance;Sitting Lower Body Dressing: Maximal assistance;Bed level Toilet Transfer: Maximal assistance (sit to stand from bed) Toileting- Clothing Manipulation and Hygiene: Minimal assistance;Sitting/lateral lean Functional mobility during ADLs: Maximal assistance (sit to stand from bed) General ADL Comments: Educated to be careful with RUE as he has decreased sensation and to support RUE  with pillow. Pt sat EOB and urinated in urinated. OT assisted in donning left sock.   Cognition: Cognition Overall Cognitive Status: Within Functional Limits for tasks assessed Orientation Level: Oriented  X4 Cognition Arousal/Alertness: Awake/alert Behavior During Therapy: WFL for tasks assessed/performed Overall Cognitive Status: Within Functional Limits for tasks assessed   Blood pressure 136/65, pulse 77, temperature 98.8 F (37.1 C), temperature source Oral, resp. rate 18, height 6\' 1"  (1.854 m), weight 170 lb (77.111 kg), SpO2 97.00%. Physical Exam  Vitals reviewed. Eyes: EOM are normal.  Neck: Normal range of motion. Neck supple. No thyromegaly present.  Cardiovascular: Normal rate and regular rhythm.   Respiratory: Effort normal and breath sounds normal. No respiratory distress.  GI: Soft. Bowel sounds are normal. He exhibits no distension.  Neurological: He is alert.  Right-sided facial weakness. Speech is dysarthric but intelligible. He was oriented x3 and follows simple commands. RUE is trace deltoid/pec and 0/5 distally. RLE 4+ HR and KE. Left side normal. Sensation normal to PP and LT in all 4. Good insight and awareness  Skin: Skin is warm and dry.  Right BKA site is well-healed/shaped  Psychiatric: He has a normal mood and affect. His behavior is normal. Thought content normal.     Results for orders placed during the hospital encounter of 03/25/14 (from the past 24 hour(s))   URINE RAPID DRUG SCREEN (HOSP PERFORMED)     Status: None     Collection Time      03/25/14  9:20 AM       Result  Value  Ref Range     Opiates  NONE DETECTED   NONE DETECTED     Cocaine  NONE DETECTED   NONE DETECTED     Benzodiazepines  NONE DETECTED   NONE DETECTED     Amphetamines  NONE DETECTED   NONE DETECTED     Tetrahydrocannabinol  NONE DETECTED   NONE DETECTED     Barbiturates  NONE DETECTED   NONE DETECTED   URINALYSIS, ROUTINE W REFLEX MICROSCOPIC     Status: Abnormal     Collection Time      03/25/14  9:20 AM       Result  Value  Ref Range     Color, Urine  YELLOW   YELLOW     APPearance  CLOUDY (*)  CLEAR     Specific Gravity, Urine  1.016   1.005 - 1.030     pH  6.5   5.0 -  8.0     Glucose, UA  NEGATIVE   NEGATIVE mg/dL     Hgb urine dipstick  TRACE (*)  NEGATIVE     Bilirubin Urine  NEGATIVE   NEGATIVE     Ketones, ur  NEGATIVE   NEGATIVE mg/dL     Protein, ur  100 (*)  NEGATIVE mg/dL     Urobilinogen, UA  0.2   0.0 - 1.0 mg/dL     Nitrite  NEGATIVE   NEGATIVE     Leukocytes, UA  MODERATE (*)  NEGATIVE   URINE MICROSCOPIC-ADD ON     Status: Abnormal     Collection Time      03/25/14  9:20 AM       Result  Value  Ref Range     Squamous Epithelial / LPF  RARE   RARE     WBC, UA  21-50   <3 WBC/hpf     RBC / HPF  0-2   <3  RBC/hpf     Bacteria, UA  MANY (*)  RARE   COMPREHENSIVE METABOLIC PANEL     Status: Abnormal     Collection Time      03/26/14  5:20 AM       Result  Value  Ref Range     Sodium  138   137 - 147 mEq/L     Potassium  4.1   3.7 - 5.3 mEq/L     Chloride  104   96 - 112 mEq/L     CO2  21   19 - 32 mEq/L     Glucose, Bld  129 (*)  70 - 99 mg/dL     BUN  11   6 - 23 mg/dL     Creatinine, Ser  0.78   0.50 - 1.35 mg/dL     Calcium  8.6   8.4 - 10.5 mg/dL     Total Protein  7.0   6.0 - 8.3 g/dL     Albumin  3.1 (*)  3.5 - 5.2 g/dL     AST  15   0 - 37 U/L     ALT  12   0 - 53 U/L     Alkaline Phosphatase  61   39 - 117 U/L     Total Bilirubin  0.5   0.3 - 1.2 mg/dL     GFR calc non Af Amer  >90   >90 mL/min     GFR calc Af Amer  >90   >90 mL/min   CBC     Status: None     Collection Time      03/26/14  5:20 AM       Result  Value  Ref Range     WBC  10.1   4.0 - 10.5 K/uL     RBC  4.77   4.22 - 5.81 MIL/uL     Hemoglobin  15.1   13.0 - 17.0 g/dL     HCT  43.5   39.0 - 52.0 %     MCV  91.2   78.0 - 100.0 fL     MCH  31.7   26.0 - 34.0 pg     MCHC  34.7   30.0 - 36.0 g/dL     RDW  15.0   11.5 - 15.5 %     Platelets  194   150 - 400 K/uL    Ct Head Wo Contrast   03/25/2014   CLINICAL DATA:  Right-sided weakness and facial droop.  EXAM: CT HEAD WITHOUT CONTRAST  TECHNIQUE: Contiguous axial images were obtained from the base of  the skull through the vertex without intravenous contrast.  COMPARISON:  None. FINDINGS: Brain does not show accelerated atrophy. There is no evidence of old or acute focal infarction, mass lesion, hemorrhage, hydrocephalus or extra-axial collection. There is extensive atherosclerotic calcification of the major vessels at the base of the brain, most notably the right vertebral artery and both cavernous carotid arteries. The calvarium is unremarkable. There is mild mucosal inflammatory change of the paranasal sinuses.  IMPRESSION: Normal appearance of the brain itself. No evidence of acute infarction. Extensive atherosclerotic calcification of the major vessels the base of the brain.   Electronically Signed   By: Nelson Chimes M.D.   On: 03/25/2014 07:59    Mr [name] Head Wo Contrast   03/25/2014   CLINICAL DATA:  Right-sided weakness.  Hypertension.  Tobacco use.  EXAM: MRI  HEAD WITHOUT CONTRAST  MRA HEAD WITHOUT CONTRAST  TECHNIQUE: Multiplanar, multiecho pulse sequences of the brain and surrounding structures were obtained without intravenous contrast. Angiographic images of the head were obtained using MRA technique without contrast.  COMPARISON:  03/25/2014 CT.  No comparison MR.  FINDINGS: MRI HEAD FINDINGS  Acute nonhemorrhagic left paracentral pontine infarct.  Punctate and patchy white matter type changes most suggestive of result of small vessel disease in this hypertensive smoker.  No intracranial hemorrhage.  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  Abnormal appearance left vertebral artery.  Please see below.  Mild paranasal sinus mucosal thickening.  Cervical medullary junction, pituitary region, pineal region and orbital structures unremarkable.  MRA HEAD FINDINGS  Arterial venous malformation left hippocampus may have supply from the left posterior cerebral artery with drainage towards the vein of Galen. Tiny associated aneurysm not excluded.  Left vertebral  artery is occluded.  There is a prominent vessel arising from the base of the basilar artery which may represent a combined left posterior inferior cerebellar artery and left anterior inferior cerebellar artery.  Marked irregularity right vertebral artery with narrowing and fusiform dilation. Ulceration not excluded.  Small vessel at the base of the basilar artery directed towards right may represent combined right posterior inferior cerebellar artery and right anterior inferior cerebellar artery.  Irregularity and mild narrowing of the a ectatic basilar artery.  Marked narrowing left posterior cerebral artery P2 segment. Mild to moderate narrowing right posterior cerebral artery P2 segment.  Ectatic irregular and slightly narrowed aspects of the pre cavernous and cavernous segment of the internal carotid artery bilaterally. Mild narrowing clinoid segment of the left anterior cerebral artery.  Mild narrowing irregularity portions of the posterior cerebral artery bilaterally.  IMPRESSION: MRI HEAD:  Acute nonhemorrhagic left paracentral pontine infarct.  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  White matter type changes probably related to result of small vessel disease.  MRA HEAD:  Arterial venous malformation left hippocampus may have supply from the left posterior cerebral artery with drainage towards the vein of Galen. Tiny associated aneurysm not excluded.  Left vertebral artery is occluded.  Intracranial atherosclerotic type changes otherwise as detailed above.  These results will be called to the ordering clinician or representative by the Radiologist Assistant, and communication documented in the PACS Dashboard.   Electronically Signed   By: Chauncey Cruel M.D.   On: 03/25/2014 12:52    Mr Brain Wo Contrast   03/25/2014   CLINICAL DATA:  Right-sided weakness.  Hypertension.  Tobacco use.  EXAM: MRI HEAD WITHOUT CONTRAST  MRA HEAD WITHOUT CONTRAST  TECHNIQUE:  Multiplanar, multiecho pulse sequences of the brain and surrounding structures were obtained without intravenous contrast. Angiographic images of the head were obtained using MRA technique without contrast.  COMPARISON:  03/25/2014 CT.  No comparison MR.  FINDINGS: MRI HEAD FINDINGS  Acute nonhemorrhagic left paracentral pontine infarct.  Punctate and patchy white matter type changes most suggestive of result of small vessel disease in this hypertensive smoker.  No intracranial hemorrhage.  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  Abnormal appearance left vertebral artery.  Please see below.  Mild paranasal sinus mucosal thickening.  Cervical medullary junction, pituitary region, pineal region and orbital structures unremarkable.  MRA HEAD FINDINGS  Arterial venous malformation left hippocampus may have supply from the left posterior cerebral artery with drainage  towards the vein of Galen. Tiny associated aneurysm not excluded.  Left vertebral artery is occluded.  There is a prominent vessel arising from the base of the basilar artery which may represent a combined left posterior inferior cerebellar artery and left anterior inferior cerebellar artery.  Marked irregularity right vertebral artery with narrowing and fusiform dilation. Ulceration not excluded.  Small vessel at the base of the basilar artery directed towards right may represent combined right posterior inferior cerebellar artery and right anterior inferior cerebellar artery.  Irregularity and mild narrowing of the a ectatic basilar artery.  Marked narrowing left posterior cerebral artery P2 segment. Mild to moderate narrowing right posterior cerebral artery P2 segment.  Ectatic irregular and slightly narrowed aspects of the pre cavernous and cavernous segment of the internal carotid artery bilaterally. Mild narrowing clinoid segment of the left anterior cerebral artery.  Mild narrowing irregularity portions of  the posterior cerebral artery bilaterally.  IMPRESSION: MRI HEAD:  Acute nonhemorrhagic left paracentral pontine infarct.  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  White matter type changes probably related to result of small vessel disease.  MRA HEAD:  Arterial venous malformation left hippocampus may have supply from the left posterior cerebral artery with drainage towards the vein of Galen. Tiny associated aneurysm not excluded.  Left vertebral artery is occluded.  Intracranial atherosclerotic type changes otherwise as detailed above.  These results will be called to the ordering clinician or representative by the Radiologist Assistant, and communication documented in the PACS Dashboard.   Electronically Signed   By: Bridgett Larsson M.D.   On: 03/25/2014 12:52     Assessment/Plan: Diagnosis: left pontine infarct Does the need for close, 24 hr/day medical supervision in concert with the patient's rehab needs make it unreasonable for this patient to be served in a less intensive setting? Yes Co-Morbidities requiring supervision/potential complications: right BKA Due to bladder management, bowel management, safety, skin/wound care, disease management, medication administration, pain management and patient education, does the patient require 24 hr/day rehab nursing? Yes Does the patient require coordinated care of a physician, rehab nurse, PT (1-2 hrs/day, 5 days/week), OT (1-2 hrs/day, 5 days/week) and SLP (1 hrs/day, 5 days/week) to address physical and functional deficits in the context of the above medical diagnosis(es)? Yes Addressing deficits in the following areas: balance, endurance, locomotion, strength, transferring, bowel/bladder control, bathing, dressing, feeding, grooming, toileting, speech and psychosocial support Can the patient actively participate in an intensive therapy program of at least 3 hrs of therapy per day at least 5 days per week? Yes The  potential for patient to make measurable gains while on inpatient rehab is excellent Anticipated functional outcomes upon discharge from inpatient rehab are modified independent  with PT, modified independent and supervision with OT, modified independent with SLP. Estimated rehab length of stay to reach the above functional goals is: 14-18 days Does the patient have adequate social supports to accommodate these discharge functional goals? Yes Anticipated D/C setting: Home Anticipated post D/C treatments: HH therapy and Outpatient therapy Overall Rehab/Functional Prognosis: excellent   RECOMMENDATIONS: This patient's condition is appropriate for continued rehabilitative care in the following setting: CIR Patient has agreed to participate in recommended program. Yes Note that insurance prior authorization may be required for reimbursement for recommended care.   Comment: Rehab Admissions Coordinator to follow up.   Thanks,   Ranelle Oyster, MD, [place] [facility]         03/26/2014    Revision  History...     Date/Time User Action   03/26/2014 3:48 PM Meredith Staggers, MD Sign   03/26/2014 8:13 AM Cathlyn Parsons, PA-C Pend  View Details Report   Routing History...     Date/Time From To Method   03/26/2014 3:48 PM Meredith Staggers, MD Meredith Staggers, MD In Basket   03/26/2014 3:48 PM Meredith Staggers, MD No Pcp Per Patient In Basket

## 2014-04-04 NOTE — Progress Notes (Signed)
63 y.o. 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 Patient independent prior to admission living alone. Admitted 03/25/2014 with right-sided weakness and dysarthria. 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 Echocardiogram with ejection fraction 16% grade 1 diastolic dysfunction. Carotid Dopplers with no ICA stenosis.   Subjective/Complaints: No new issues, mild R shoulder pain improved by sling  Review of Systems - Negative except R arm weak  Objective: Vital Signs: Blood pressure 137/74, pulse 70, temperature 98 F (36.7 C), temperature source Oral, resp. rate 18, height 6\' 1"  (1.854 m), weight 75 kg (165 lb 5.5 oz), SpO2 97.00%. No results found. No results found for this or any previous visit (from the past 72 hour(s)).    Physical Exam  Vitals reviewed.  Eyes: EOM are normal.  Neck: Normal range of motion. Neck supple. No thyromegaly present.  Cardiovascular: Normal rate and regular rhythm.  Respiratory: Effort normal and breath sounds normal. No respiratory distress.  GI: Soft. Bowel sounds are normal. He exhibits no distension.  Neurological: He is alert.  Right-sided facial weakness. Speech is mildly dysarthric  He was oriented x3 and follows simple commands. RUE is trace deltoid/pec and 0/5 distally. RLE 4 HF and KE,Right hamstrings 3-/5 Left side normal. Sensation normal to LT in BUEs, reduced at foot and ankle on Left. Good insight and awareness  Skin: Skin is warm and dry.  Right BKA site is well-healed/shaped  MSK: no pain with shoulder AAROM Psychiatric: He has a normal mood and affect. His behavior is normal. Thought content normal    Assessment/Plan: 1. Functional deficits secondary to Left pontine infarct with R  HP which require 3+ hours per day of interdisciplinary therapy in a comprehensive inpatient rehab setting. Physiatrist is providing close team supervision and 24 hour management of active medical problems listed below. Physiatrist and rehab team continue to assess barriers to discharge/monitor patient progress toward functional and medical goals. FIM: FIM - Bathing Bathing Steps Patient Completed: Chest;Right Arm;Abdomen;Front perineal area;Right upper leg Bathing: 4: Steadying assist  FIM - Upper Body Dressing/Undressing Upper body dressing/undressing steps patient completed: Put head through opening of pull over shirt/dress;Thread/unthread left sleeve of pullover shirt/dress;Thread/unthread right sleeve of pullover shirt/dresss;Pull shirt over trunk Upper body dressing/undressing: 5: Supervision: Safety issues/verbal cues FIM - Lower Body Dressing/Undressing Lower body dressing/undressing steps patient completed: Thread/unthread right underwear leg;Thread/unthread left underwear leg;Pull underwear up/down;Thread/unthread right pants leg;Thread/unthread left pants leg;Pull pants up/down;Don/Doff left sock;Don/Doff left shoe Lower body dressing/undressing: 4: Min-Patient completed 75 plus % of tasks  FIM - Toileting Toileting steps completed by patient: Adjust clothing prior to toileting;Performs perineal hygiene;Adjust clothing after toileting Toileting Assistive Devices: Grab bar or rail for support Toileting: 6: More than reasonable amount of time  FIM - Radio producer Devices: Grab bars;Elevated toilet seat Toilet Transfers: 0-Activity did not occur  FIM - Control and instrumentation engineer Devices: Arm rests;Walker;Prosthesis Bed/Chair Transfer: 6: Supine > Sit: No assist  FIM - Locomotion: Wheelchair Distance: 150 Locomotion: Wheelchair: 0: Activity did not occur FIM - Locomotion: Ambulation Locomotion: Ambulation Assistive Devices:  Prosthesis;Cane - [employer] Ambulation/Gait Assistance: 5: Supervision Locomotion: Ambulation: 5: Travels 150 ft or more with supervision/safety issues  Comprehension Comprehension Mode: Auditory Comprehension: 6-Follows complex conversation/direction: With extra  time/assistive device  Expression Expression Mode: Verbal Expression: 6-Expresses complex ideas: With extra time/assistive device  Social Interaction Social Interaction: 6-Interacts appropriately with others with medication or extra time (anti-anxiety, antidepressant).  Problem Solving Problem Solving: 6-Solves complex problems: With extra time  Memory Memory: 6-More than reasonable amt of time   Medical Problem List and Plan:  1. 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 DVT Prophylaxis/Anticoagulation: Subcutaneous heparin.bruising and pain at injection sites, amb>150' may D/C 3. Pain Management: Tylenol as needed  4. 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 Patient with prosthesis provided by [employer] prost[employer]  5. Neuropsych: This patient is capable of making decisions on his own behalf.  6. Hypertension. No current antihypertensive medication. Patient on Benicar 40 mg daily prior to admission. Monitor with increased mobility  7. Tobacco abuse. NicoDerm patch. Provide counseling    LOS (Days) 7 A FACE TO FACE EVALUATION WAS PERFORMED  Charlett Blake 04/04/2014, 7:25 AM

## 2014-04-04 NOTE — Progress Notes (Signed)
Occupational Therapy Session Note  Patient Details  Name: Jordan Arellano MRN: [id number] Date of Birth: 10/21/1951  Today's Date: 04/04/2014 Time: 1000-1059 Time Calculation (min): 59 min  Short Term Goals: Week 1:  OT Short Term Goal 1 (Week 1): Pt will use the RUE as a stabilizer with mod assist during selfcare tasks. OT Short Term Goal 2 (Week 1): Pt will perform RUE AAROM exercises with independence following handout. OT Short Term Goal 3 (Week 1): Pt will perform UB bathing and dressing with supervision, OT Short Term Goal 4 (Week 1): Pt will perform all LB bathing and dressing with supervision including donning right prosthetic.  OT Short Term Goal 5 (Week 1): Pt will perform toilet transfers with supervision using assistive device to elevated toilet or 3:1.  Skilled Therapeutic Interventions/Progress Updates:    Pt performed bathing and dressing during session.  He was able to ambulate to the shower with the quad cane with supervision.  He was able to perform bathing in sitting position with overall min assist level.  He needed max assist to wash his LUE with the RUE as well.  Performed dressing sitting on edge of tub bench as this is how pt will likely perform the task at home and will need to donn his prosthesis for easier transfer out of the shower.  Pt completed UB dressing sitting at the sink with supervision and increased time.  When finished took him down to the gym and performed fitting of Bioness H 200 to help facilitate right hand digit flexion and extension.  Pt performed 10 mins total of Open Exercise Mode and Exercise Mode together without any adverse reactions.    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

## 2014-04-04 NOTE — Progress Notes (Signed)
Physical Therapy Session Note  Patient Details  Name: Jordan Arellano MRN: [id number] Date of Birth: [birth year]  Today's Date: 04/04/2014 Time: 4098-1191 and 1130-1156 Time Calculation (min): 55 min and 26 mins  Short Term Goals: Week 1:  PT Short Term Goal 1 (Week 1): Pt will perform bed mobility to the R or L with attention and safety to RUE at min assist level  PT Short Term Goal 2 (Week 1): Pt will perform stand pivot transfer with LRAD at min/guard level with safe placement of device PT Short Term Goal 3 (Week 1): Pt will perform dynamic standing balance at min assist PT Short Term Goal 4 (Week 1): Pt will perform w/c mobility using L hemi technique x 150' at supervision level PT Short Term Goal 5 (Week 1): Pt will perform gait with LRAD x 150' at min/guard assist  Skilled Therapeutic Interventions/Progress Updates:   First AM session:  Pt received sitting at EOB when PT arrived, agreeable to therapy.  Focus of session was w/c mobility and gait in community setting to further challenge balance and prepare for D/C.  Pt self propelled w/c >200' in controlled environment, uneven surfaces and up/down inclines at supervision level with min cues for safety when descending ramp.  Pt also worked on gait >200' total over controlled surfaces, uneven surfaces, up/down inclines, up/down curbs, and over grass and mulch to challenge balance and further prepare for safety.  Pt able to perform all gait at supervision to min assist (over grass and mulch for safety) with cues for safety and technique, esp over uneven grassy and mulch area.  Ended session with having pt ambulate inside of community restroom to assess "handicapped accessible" toilet stall.   Note that there are grab bars and doors are wider, however toilet is still very low to ground.  Pt verbalized understanding of safety in those situations.  Also had pt ambulate around gift shop to simulate more home and community like spaces with carpeted  flooring and tight spaces.  Performed gift shop ambulation at supervision level.  Pt returned to w/c and was assisted back to rehab floor.  Once on floor, had pt self propel back to room.  Pt left with all needs in reach and half lap tray to support RUE.  Pt pleased with progress outside, however states fatigue from all the "hard work."    Second AM session:  Pt received sitting in w/c, agreeable to therapy.  Self propelled to therapy gym using L hemi technique at Mod I level.  Focus of session was [employer] balance training for increased weight shift and WB through RLE, esp to increase forward translation of R hip over R foot.  Performed weight shifts R/L and also in diagonal fashion then progressed to limits of stability.  Performed standing x approx 18 mins total with standing rest breaks in between and also with first rep of activity using LUE>no UE support to increase amount of hip strategy.  Pt requires min/guard for activity with min assist intermittently for increased weight shift over RLE.  Ended session with gait back to room >100' with [facility] at supervision level.  Pt left in w/c in room with all needs in reach.     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      See FIM for current functional status  Therapy/Group: Individual Therapy  Raquel Sarna A Swanson Farnell 04/04/2014, 9:23 AM

## 2014-04-04 NOTE — Progress Notes (Signed)
PMR Admission Coordinator Pre-Admission Assessment   Patient: Jordan Arellano is an 62 y.o., male MRN: [id number] DOB: 03/16/1951 Height: 6' 1" (185.4 cm) Weight: 77.111 kg (170 lb)                                                                                                                                                  Insurance Information HMO: Yes    PPO:       PCP:       IPA:       80/20:       OTHER:  Open access Plan 1 PRIMARY: A[employer] medicare complete      Policy#: [id number]      Subscriber: Treyden Batt CM Name: Shannon Stacy      Phone#: [phone]     Fax#:    Follow up will be with on site reviewer Shannon   Pre-Cert#: [id number]      Employer: Disabled Benefits:  Phone #: [phone]     Name: Jeremy Eff. Date: 12/06/13     Deduct: $0      Out of Pocket Max: $4900 (none met)        Life Max: unlimited CIR: $345/day for days 1-5       SNF: $0 days 1-20; $155 days 21-52; $0 days 53-100 (100 day visit max) Outpatient: no limits, based on med. necessity      Co-Pay: $40/visit Home Health: 100%      Co-Pay: none DME: 80%     Co-Pay: 20% Providers: in network  Emergency Contact Information Contact Information     Name  Relation  Home  Work  Mobile     Rikard,Victoriano Wayne  Son  [phone]    [phone]       Current Medical History  Patient Admitting Diagnosis: L pontine CVA   History of Present Illness: Jordan Arellano is a 63 y.o. 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 Patient independent prior to admission living alone. Admitted 03/25/2014 with right-sided weakness and dysarthria. 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 Echocardiogram with ejection fraction 60% grade 1 diastolic dysfunction. Carotid Dopplers  are pending. Patient did not receive TPA. Placed on aspirin for CVA prophylaxis with subcutaneous Septra for DVT prophylaxis. NicoDerm patch placed for history of tobacco abuse. Tolerating a regular consistency diet. Occupational therapy evaluation completed 03/25/2014 with recommendations for physical medicine rehabilitation consult.   NIH Total: 5   Past Medical History  Past Medical History   Diagnosis  Date   .  Hypertension     .  TIA (transient ischemic attack)     .    Stroke        Family History  family history includes Hypertension in his father and mother.   Prior Rehab/Hospitalizations: had previous acute inpt rehab following his R BKA approx 10 years ago      Current Medications  Current facility-administered medications:0.9 %  sodium chloride infusion, , Intravenous, Continuous, Stephen K Chiu, MD, Last Rate: 75 mL/hr at 03/26/14 0350, 1,000 mL at 03/26/14 0350;  acetaminophen (TYLENOL) suppository 650 mg, 650 mg, Rectal, Q6H PRN, Stephen K Chiu, MD;  acetaminophen (TYLENOL) tablet 650 mg, 650 mg, Oral, Q6H PRN, Stephen K Chiu, MD;  aspirin tablet 325 mg, 325 mg, Oral, Daily, Stephen K Chiu, MD, 325 mg at 03/27/14 1051 atorvastatin (LIPITOR) tablet 20 mg, 20 mg, Oral, q1800, Sharon L Biby, NP, 20 mg at 03/27/14 1828;  heparin injection 5,000 Units, 5,000 Units, Subcutaneous, 3 times per day, Stephen K Chiu, MD, 5,000 Units at 03/28/14 0721;  morphine 2 MG/ML injection 2 mg, 2 mg, Intravenous, Q4H PRN, Stephen K Chiu, MD;  nicotine (NICODERM CQ - dosed in mg/24 hours) patch 14 mg, 14 mg, Transdermal, Daily, Stephen K Chiu, MD, 14 mg at 03/27/14 1052 sodium chloride 0.9 % injection 3 mL, 3 mL, Intravenous, Q12H, Stephen K Chiu, MD, 3 mL at 03/27/14 2308   Patients Current Diet: Cardiac   Precautions / Restrictions Precautions Precautions: Fall Precaution Comments: pt reports no recent h/o falls, but now with new CVA.    Restrictions Weight Bearing Restrictions: No     Prior Activity Level Community (5-7x/wk): Went out daily.  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   Home Assistive Devices / Equipment Home Assistive Devices/Equipment: Wheelchair Home Equipment: Other (comment) (has a seat to use for shower)   Prior Functional Level Prior Function Level of Independence: Independent Comments: Does not use a cane or a walker, but does use his prosthesis with gait.    Current Functional Level Cognition   Overall Cognitive Status: Within Functional Limits for tasks assessed Orientation Level: Oriented X4     Extremity Assessment (includes Sensation/Coordination)          ADLs   Overall ADL's : Needs assistance/impaired Eating/Feeding: Set up;Supervision/ safety;Sitting Grooming: Minimal assistance;Sitting Upper Body Bathing: Moderate assistance;Sitting Lower Body Bathing: Moderate assistance;Bed level Upper Body Dressing : Moderate assistance;Sitting Lower Body Dressing: Maximal assistance;Bed level Toilet Transfer: Maximal assistance (sit to stand from bed) Toileting- Clothing Manipulation and Hygiene: Minimal assistance;Sitting/lateral lean Functional mobility during ADLs: Maximal assistance (sit to stand from bed) General ADL Comments: Educated to be careful with RUE as he has decreased sensation and to support RUE with pillow. Pt sat EOB and urinated in urinated. OT assisted in donning left sock.      Mobility   Overal bed mobility: Needs Assistance Bed Mobility: Supine to Sit Supine to sit: Min guard General bed mobility comments: min assist to support trunk during transition to sit.      Transfers   Overall transfer level: Needs assistance Equipment used: None (right leg prosthesis and R PFRW) Transfers: Sit to/from Stand Sit to Stand: Mod assist Stand pivot transfers: Mod assist General transfer comment: Mod assist to help come forward and up.  R knee now stable enough to  maintain control without  support      Ambulation / Gait / Stairs / Wheelchair Mobility   Ambulation/Gait Ambulation/Gait assistance: Min assist;+2 safety/equipment Ambulation Distance (Feet): 35 Feet Assistive device:  (R  PFRW) Gait Pattern/deviations: Step-to pattern;Decreased   stride length General Gait Details: PFRW made it much easier for pt to stay in control.      Posture / Balance       Special needs/care consideration  BiPAP/CPAP no   CPM no Continuous Drip IV no Dialysis no          Life Vest no   Oxygen no   Special Bed no   Trach Size no   Wound Vac (area) no         Skin no                                 Bowel mgmt: last BM on 03-26-14 Bladder mgmt: currently using urinal Diabetic mgmt no   Note: pt with hx of R BKA (approx 10 yrs ago) and was independent with prosthesis without AD prior to this new CVA      Previous Home Environment Living Arrangements: Alone Available Help at Discharge: Family;Available PRN/intermittently Type of Home: House Home Layout: One level Home Access: Stairs to enter Entrance Stairs-Rails:  (no rail in back; left rail in front) Entrance Stairs-Number of Steps: 2 in back; 4 in front Bathroom Shower/Tub: Chiropodist: Standard Home Care Services: No Additional Comments: wears glasses to read, does not drive.     Discharge Living Setting Plans for Discharge Living Setting: Patient's home;Alone;House Type of Home at Discharge: House Discharge Home Layout: One level Discharge Home Access: Stairs to enter Entrance Stairs-Rails: None Entrance Stairs-Number of Steps: 1 step entry back door Does the patient have any problems obtaining your medications?: No   Social/Family/Support Systems Patient Roles: Parent (Has son, aunt/uncle and cousin.) Contact Information: Findley Vi - son [phone] Anticipated Caregiver: self Anticipated Caregiver's Contact Information: see emergency contacts Ability/Limitations of Caregiver: Has mod I  goals.  Aunt and uncle live next door.  Cousin lives a few doors down. Caregiver Availability: Intermittent Discharge Plan Discussed with Primary Caregiver: Yes (noting goals are for mod. Indep and left voicemail update with pt's son). Pt also stated his Aunt/Uncle who live next door could help if needed. Is Caregiver In Agreement with Plan?: Yes per son Does Caregiver/Family have Issues with Lodging/Transportation while Pt is in Rehab?: No   Goals/Additional Needs Patient/Family Goal for Rehab: PT/OT/ST mod I goals Expected length of stay: 14-18 days Cultural Considerations: None Dietary Needs: Heart diet, thin liquids Equipment Needs: TBD Pt/Family Agrees to Admission and willing to participate: Yes Program Orientation Provided & Reviewed with Pt/Caregiver Including Roles  & Responsibilities: Yes     Decrease burden of Care through IP rehab admission: NA   Possible need for SNF placement upon discharge: not anticipated   Patient Condition: This patient's condition remains as documented in the consult dated 03-26-14, in which the Rehabilitation Physician determined and documented that the patient's condition is appropriate for intensive rehabilitative care in an inpatient rehabilitation facility. Will admit to inpatient rehab today.   Preadmission Screen Completed By:  Ave Filter, PT, 03/28/2014 10:24 AM ______________________________________________________________________    Discussed status with Dr. Letta Pate on 03-28-14 at 1029 and received telephone approval for admission today.   Admission Coordinator:  Quentin Mulling Esli Jernigan, PT, time 1029/Date 03-28-14          Cosigned by: Charlett Blake, MD    [03/28/2014 11:42 AM]  Revision History...     Date/Time User Action   03/28/2014 11:42 AM Charlett Blake, MD Cosign  03/28/2014 10:31 AM  L  Sign   03/28/2014 8:59 AM Eugenia M Logue, RN Share  View Details Report      

## 2014-04-04 NOTE — Progress Notes (Signed)
Occupational Therapy Session Note  Patient Details  Name: Mitsuo Budnick MRN: [id number] Date of Birth: 1951/03/04  Today's Date: 04/04/2014 Time: 7867-6720 Time Calculation (min): 44 min  Skilled Therapeutic Interventions/Progress Updates:    Pt worked on [employer] and neuromuscular re-education down in the gym.  Had pt transfer to the therapy mat with min guard assist to begin session.  Applied NMES to right hand, beginning with Exercise Mode at same perimeters as previous session.  With facilitation of digit extensors had pt work on performing shoulder flexion to simulate reach while therapist provided max facilitation.  With activation of digit flexion pt was asked to bring his arm back to his side as well with max assist.  Progressed to use of Grasp mode to work on picking up and stacking cups placed at knee level.  Pt able to perform with max facilitation as well.  Finished by having him work on grasping and stacking cups using only Open Mode to activate digit extension and then working on digit flexion without stimulation.  He was able to perform grasp with min facilitation to hold cup.  Stimulation total for 25 mins of session without any adverse reactions.    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

## 2014-04-05 ENCOUNTER — Inpatient Hospital Stay (HOSPITAL_COMMUNITY): Payer: Medicare Other | Admitting: Rehabilitation

## 2014-04-05 ENCOUNTER — Encounter (HOSPITAL_COMMUNITY): Payer: Medicare Other | Admitting: Occupational Therapy

## 2014-04-05 ENCOUNTER — Inpatient Hospital Stay (HOSPITAL_COMMUNITY): Payer: Medicare Other | Admitting: Occupational Therapy

## 2014-04-05 DIAGNOSIS — G8110 Spastic hemiplegia affecting unspecified side: Secondary | ICD-10-CM

## 2014-04-05 MED ORDER — IRBESARTAN 75 MG PO TABS
75.0000 mg | ORAL_TABLET | Freq: Every day | ORAL | Status: DC
  Administered 2014-04-05 – 2014-04-08 (×4): 75 mg via ORAL
  Filled 2014-04-05 (×5): qty 1

## 2014-04-05 NOTE — Progress Notes (Signed)
63 y.o. 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 Patient independent prior to admission living alone. Admitted 03/25/2014 with right-sided weakness and dysarthria. 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 Echocardiogram with ejection fraction 25% grade 1 diastolic dysfunction. Carotid Dopplers with no ICA stenosis.   Subjective/Complaints: No new issues, mild R shoulder pain  R LE feels "sketchy" when ambulating  Review of Systems - Negative except R arm weak  Objective: Vital Signs: Blood pressure 150/75, pulse 74, temperature 97.9 F (36.6 C), temperature source Oral, resp. rate 18, height 6\' 1"  (1.854 m), weight 75 kg (165 lb 5.5 oz), SpO2 99.00%. No results found. No results found for this or any previous visit (from the past 72 hour(s)).    Physical Exam  Vitals reviewed.  Eyes: EOM are normal.  Neck: Normal range of motion. Neck supple. No thyromegaly present.  Cardiovascular: Normal rate and regular rhythm.  Respiratory: Effort normal and breath sounds normal. No respiratory distress.  GI: Soft. Bowel sounds are normal. He exhibits no distension.  Neurological: He is alert.  Right-sided facial weakness. Speech is mildly dysarthric  He was oriented x3 and follows simple commands. RUE is trace deltoid/pec and 0/5 distally. RLE 4 HF and KE,Right hamstrings 3-/5 Left side normal. Sensation normal to LT in BUEs, reduced at foot and ankle on Left. Good insight and awareness  Skin: Skin is warm and dry.  Right BKA site is well-healed/shaped  MSK: no pain with shoulder AAROM Psychiatric: He has a normal mood and affect. His behavior is normal. Thought content normal    Assessment/Plan: 1. Functional deficits secondary to Left  pontine infarct with R HP which require 3+ hours per day of interdisciplinary therapy in a comprehensive inpatient rehab setting. Physiatrist is providing close team supervision and 24 hour management of active medical problems listed below. Physiatrist and rehab team continue to assess barriers to discharge/monitor patient progress toward functional and medical goals. FIM: FIM - Bathing Bathing Steps Patient Completed: Chest;Right Arm;Abdomen;Front perineal area;Right upper leg;Left upper leg;Right lower leg (including foot);Left lower leg (including foot) Bathing: 4: Min-Patient completes 8-9 23f 10 parts or 75+ percent  FIM - Upper Body Dressing/Undressing Upper body dressing/undressing steps patient completed: Put head through opening of pull over shirt/dress;Thread/unthread left sleeve of pullover shirt/dress;Thread/unthread right sleeve of pullover shirt/dresss;Pull shirt over trunk Upper body dressing/undressing: 5: Supervision: Safety issues/verbal cues FIM - Lower Body Dressing/Undressing Lower body dressing/undressing steps patient completed: Thread/unthread right underwear leg;Thread/unthread left underwear leg;Pull underwear up/down;Thread/unthread right pants leg;Thread/unthread left pants leg;Pull pants up/down;Don/Doff left sock;Don/Doff left shoe Lower body dressing/undressing: 5: Supervision: Safety issues/verbal cues  FIM - Toileting Toileting steps completed by patient: Adjust clothing prior to toileting;Performs perineal hygiene;Adjust clothing after toileting Toileting Assistive Devices: Grab bar or rail for support Toileting: 4: Steadying assist  FIM - Radio producer Devices: Grab bars;Elevated toilet seat Toilet Transfers: 0-Activity did not occur  FIM - Control and instrumentation engineer Devices: Arm rests;Walker;Prosthesis Bed/Chair Transfer: 6: Supine > Sit: No assist  FIM - Locomotion: Wheelchair Distance:  150 Locomotion: Wheelchair: 0: Activity did not occur FIM - Locomotion: Ambulation Locomotion: Ambulation Assistive Devices: Prosthesis;Cane [employer] Ambulation/Gait Assistance: 5: Supervision Locomotion: Ambulation: 5: Travels 150 ft or more  with supervision/safety issues  Comprehension Comprehension Mode: Auditory Comprehension: 6-Follows complex conversation/direction: With extra time/assistive device  Expression Expression Mode: Verbal Expression: 6-Expresses complex ideas: With extra time/assistive device  Social Interaction Social Interaction: 6-Interacts appropriately with others with medication or extra time (anti-anxiety, antidepressant).  Problem Solving Problem Solving: 7-Solves complex problems: Recognizes & self-corrects  Memory Memory: 6-More than reasonable amt of time   Medical Problem List and Plan:  1. 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 DVT Prophylaxis/Anticoagulation: Subcutaneous heparin.bruising and pain at injection sites, amb>150' may D/C 3. Pain Management: Tylenol as needed  4. 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 Patient with prosthesis provided by [employer] prost[employer]  5. Neuropsych: This patient is capable of making decisions on his own behalf.  6. Hypertension. No current antihypertensive medication. Patient on Benicar 40 mg daily prior to admission.BP creeping up will resume 7. Tobacco abuse. NicoDerm patch. Provide counseling    LOS (Days) 8 A FACE TO FACE EVALUATION WAS PERFORMED  Charlett Blake 04/05/2014, 7:15 AM

## 2014-04-05 NOTE — Progress Notes (Signed)
Occupational Therapy Session Note  Patient Details  Name: Jordan Arellano MRN: [id number] Date of Birth: 23-Apr-1951  Today's Date: 04/05/2014 Time: 3762-8315 Time Calculation (min): 45 min  Skilled Therapeutic Interventions/Progress Updates:    Pt worked on Brewing technologist for the RUE during session.  Applied NMES for the right digit flexors and extensors using the Bioness H200.  Began with Exercise Mode with focus on both digit extension as well as flexion.  Progressed to Open Exercise Mode and having pt work on sliding his right hand forward while placed on a washcloth and amputee board.  Angled the board downhill to increase potential active movement.  Transitioned to placing bean bags on the board and having him attempt to push the bean bags off of the end of the board as his fingers extended.  Total stimulation time approximately 25 mins with parameters established from previous session.  Pt with no adverse reactions to therapy.   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

## 2014-04-05 NOTE — Progress Notes (Signed)
Physical Therapy Session Note  Patient Details  Name: Jordan Arellano MRN: [id number] Date of Birth: 01/11/1951  Today's Date: 04/05/2014 Time: 9622-2979 Time Calculation (min): 57 min  Short Term Goals: Week 1:  PT Short Term Goal 1 (Week 1): Pt will perform bed mobility to the R or L with attention and safety to RUE at min assist level  PT Short Term Goal 2 (Week 1): Pt will perform stand pivot transfer with LRAD at min/guard level with safe placement of device PT Short Term Goal 3 (Week 1): Pt will perform dynamic standing balance at min assist PT Short Term Goal 4 (Week 1): Pt will perform w/c mobility using L hemi technique x 150' at supervision level PT Short Term Goal 5 (Week 1): Pt will perform gait with LRAD x 150' at min/guard assist  Skilled Therapeutic Interventions/Progress Updates:   Pt received sitting on therapy mat in gym, having just finished with OT session, agreeable to PT session.  Session focused on standing balance activity with use of Wii fit program with bowling game with feet apart and feet in semi tandem to further challenge balance.  Also performed wii fit on balance board to work on weight shifting to increase comfort with weight shifts to the R.  Performed for total of 20 mins with two seated rest breaks in between due to fatigue.  Pt then performed reaching activity reaching to the L for horseshoes and placing them to the R on basketball goal in order to work on forward translation of weight over RLE.  Can perform at supervision level, however provided min assist for increased weight shift over to the R.  Performed two reps of approx 4 mins.  Ended session with gait without AD while tossing ball on netted paddle in order to work on alternating attention to further challenge balance.  Pt self propelled w/c back to room using L hemi technique at Mod I level.  Left in w/c with all needs in reach.    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

## 2014-04-05 NOTE — Progress Notes (Signed)
Physical Therapy Session Note  Patient Details  Name: Jordan Arellano MRN: [id number] Date of Birth: 10/17/1951  Today's Date: 04/05/2014 Time: 0814-4818 Time Calculation (min): 28 min  Short Term Goals: Week 1:  PT Short Term Goal 1 (Week 1): Pt will perform bed mobility to the R or L with attention and safety to RUE at min assist level  PT Short Term Goal 2 (Week 1): Pt will perform stand pivot transfer with LRAD at min/guard level with safe placement of device PT Short Term Goal 3 (Week 1): Pt will perform dynamic standing balance at min assist PT Short Term Goal 4 (Week 1): Pt will perform w/c mobility using L hemi technique x 150' at supervision level PT Short Term Goal 5 (Week 1): Pt will perform gait with LRAD x 150' at min/guard assist  Skilled Therapeutic Interventions/Progress Updates:   Pt received sitting in w/c, agreeable to therapy.  Had pt self propel himself to gym using L hemi technique at supervision level.  Once in gym, focused on high level gait and balance activities.  Performed gait while kicking yoga block alternating with R and L LE to kick in order to work on weight shifting and WB through RLE and also strength with hip flex when kicking.  Performed 54' with single seated rest break at min assist level.  Then performed side stepping while pushing BOSU ball to the R and then to the L in order to work on increased weight shift to the RLE and R hip abd strength throughout.  Requires min assist to perform this task.  Ended session with gait back to room with [facility] at supervision level.  Returned to room and left in w/c with all needs in reach.    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    Locomotion : Ambulation Ambulation/Gait Assistance: 5: Supervision Wheelchair Mobility Distance:  150   See FIM for current functional status  Therapy/Group: Individual Therapy  [name] 04/05/2014, 12:30 PM

## 2014-04-05 NOTE — Progress Notes (Signed)
Occupational Therapy Session Note  Patient Details  Name: Jordan Arellano MRN: [id number] Date of Birth: March 15, 1951  Today's Date: 04/05/2014 Time: 8144-8185 Time Calculation (min): 28 min  Short Term Goals: Week 1:  OT Short Term Goal 1 (Week 1): Pt will use the RUE as a stabilizer with mod assist during selfcare tasks. OT Short Term Goal 2 (Week 1): Pt will perform RUE AAROM exercises with independence following handout. OT Short Term Goal 3 (Week 1): Pt will perform UB bathing and dressing with supervision, OT Short Term Goal 4 (Week 1): Pt will perform all LB bathing and dressing with supervision including donning right prosthetic.  OT Short Term Goal 5 (Week 1): Pt will perform toilet transfers with supervision using assistive device to elevated toilet or 3:1.  Skilled Therapeutic Interventions/Progress Updates:    Pt transferred to the shower using his quad cane and prosthesis with min guard assist.  He was able to shower with min assist.  Pt did not want to wash his LEs this session as he had done them yesterday so just let water run on them.  Did not attempt standing in the shower either but leaned laterally for washing his bottom.   Pt transferred on the edge of the shower seat for dressing and to donn his prosthesis.  Min guard assist for all dressing.  He did require max assist to lift his RUE in order to put deodorant on.  When finished transferred back to the wheelchair in his room and rolled down to the therapy gym for continued rehab.  Pt worked on [employer] bilaterally using dowel rod in supine position.  Ace bandaged pt's right hand to the dowel rod as pt does not posses the functional movement to hold onto this at this time.  Mod assist needed to facilitate the RUE for repetitions of shoulder flexion and chest press in supine.  Two sets of 10 repetitions for each exercise.  Also worked on stabilizing the [place] on the mat in sitting while performing lateral reaching across his body with the  LUE.  Pt tends to flex his head to the left to attempt to maintain balance in sitting instead of facilitating the movement using RUE elbow extension.    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

## 2014-04-05 NOTE — Progress Notes (Signed)
Social Work Patient ID: Jordan Arellano, male   DOB: 08/19/1951, 63 y.o.   MRN: [id number] Pt feels ready to go home Monday and is wanting HH follow up due to transportaiton issues and the cost with his co-pays. Have ordered DME and follow up.  Check on Monday.

## 2014-04-05 NOTE — Discharge Instructions (Signed)
Inpatient Rehab Discharge Instructions  Jordan Arellano Discharge date and time: No discharge date for patient encounter.   Activities/Precautions/ Functional Status: Activity: activity as tolerated Diet: regular diet Wound Care: none needed Functional status:  ___ No restrictions     ___ Walk up steps independently ___ 24/7 supervision/assistance   ___ Walk up steps with assistance ___ Intermittent supervision/assistance  ___ Bathe/dress independently ___ Walk with walker     ___ Bathe/dress with assistance ___ Walk Independently    ___ Shower independently __x_ Walk with assistance    ___ Shower with assistance ___ No alcohol     ___ Return to work/school ________  Special Instructions: No driving   COMMUNITY REFERRALS UPON DISCHARGE:    Home Health:   PT, OT, RN    Agency:[facility] ZOXWR:[phone] Date of last service:04/08/2014    Medical Equipment/Items Ordered:WHEELCHAIR, LBQC, 3 IN 1, TUB BENCH  Agency/Supplier:[facility]    [phone]   GENERAL COMMUNITY RESOURCES FOR PATIENT/FAMILY: Support Groups:[facility] SUPPORT GROUP & [facility] SUPPORT GROUP    STROKE/TIA DISCHARGE INSTRUCTIONS SMOKING Cigarette smoking nearly doubles your risk of having a stroke & is the single most alterable risk factor  If you smoke or have smoked in the last 12 months, you are advised to quit smoking for your health.  Most of the excess cardiovascular risk related to smoking disappears within a year of stopping.  Ask you doctor about anti-smoking medications  [place] Quit Line: 1-800-QUIT NOW  Free Smoking Cessation Classes [phone]  CHOLESTEROL Know your levels; limit fat & cholesterol in your diet  Lipid Panel     Component Value Date/Time   CHOL 252* 03/27/2014 0536   TRIG 616* 03/27/2014 0536   HDL 25* 03/27/2014 0536   CHOLHDL 10.1 03/27/2014 0536   VLDL UNABLE TO CALCULATE IF TRIGLYCERIDE OVER 400 mg/dL 03/27/2014 0536   LDLCALC UNABLE TO CALCULATE IF TRIGLYCERIDE OVER 400  mg/dL 03/27/2014 0536      Many patients benefit from treatment even if their cholesterol is at goal.  Goal: Total Cholesterol (CHOL) less than 160  Goal:  Triglycerides (TRIG) less than 150  Goal:  HDL greater than 40  Goal:  LDL (LDLCALC) less than 100   BLOOD PRESSURE American Stroke Association blood pressure target is less that 120/80 mm/Hg  Your discharge blood pressure is:  BP: 137/74 mmHg  Monitor your blood pressure  Limit your salt and alcohol intake  Many individuals will require more than one medication for high blood pressure  DIABETES (A1c is a blood sugar average for last 3 months) Goal HGBA1c is under 7% (HBGA1c is blood sugar average for last 3 months)  Diabetes: No known diagnosis of diabetes    Lab Results  Component Value Date   HGBA1C 6.2* 03/26/2014     Your HGBA1c can be lowered with medications, healthy diet, and exercise.  Check your blood sugar as directed by your physician  Call your physician if you experience unexplained or low blood sugars.  PHYSICAL ACTIVITY/REHABILITATION Goal is 30 minutes at least 4 days per week  Activity: Increase activity slowly, Therapies: Physical Therapy: Home Health Return to work:   Activity decreases your risk of heart attack and stroke and makes your heart stronger.  It helps control your weight and blood pressure; helps you relax and can improve your mood.  Participate in a regular exercise program.  Talk with your doctor about the best form of exercise for you (dancing, walking, swimming, cycling).  DIET/WEIGHT Goal is  to maintain a healthy weight  Your discharge diet is: Cardiac  liquids Your height is:  Height: 6\' 1"  (185.4 cm) Your current weight is: Weight: 75 kg (165 lb 5.5 oz) Your Body Mass Index (BMI) is:  BMI (Calculated): 21.9  Following the type of diet specifically designed for you will help prevent another stroke.  Your goal weight range is:    Your goal Body Mass Index (BMI) is  19-24.  Healthy food habits can help reduce 3 risk factors for stroke:  High cholesterol, hypertension, and excess weight.  RESOURCES Stroke/Support Group:  Call [phone]   STROKE EDUCATION PROVIDED/REVIEWED AND GIVEN TO PATIENT Stroke warning signs and symptoms How to activate emergency medical system (call 911). Medications prescribed at discharge. Need for follow-up after discharge. Personal risk factors for stroke. Pneumonia vaccine given:  Flu vaccine given:  My questions have been answered, the writing is legible, and I understand these instructions.  I will adhere to these goals & educational materials that have been provided to me after my discharge from the hospital.       My questions have been answered and I understand these instructions. I will adhere to these goals and the provided educational materials after my discharge from the hospital.  Patient/Caregiver Signature _______________________________ Date __________  Clinician Signature _______________________________________ Date __________  Please bring this form and your medication list with you to all your follow-up doctor's appointments.

## 2014-04-06 ENCOUNTER — Inpatient Hospital Stay (HOSPITAL_COMMUNITY): Payer: Medicare Other | Admitting: Physical Therapy

## 2014-04-06 ENCOUNTER — Inpatient Hospital Stay (HOSPITAL_COMMUNITY): Payer: Medicare Other | Admitting: *Deleted

## 2014-04-06 DIAGNOSIS — G8110 Spastic hemiplegia affecting unspecified side: Secondary | ICD-10-CM

## 2014-04-06 NOTE — Progress Notes (Signed)
Occupational Therapy Session Note  Patient Details  Name: Jordan Arellano MRN: [id number] Date of Birth: 1951-09-01  Today's Date: 04/06/2014 Time: 1430-15`15  Time Calculation (min): 45 min  Short Term Goals: Week 1:  OT Short Term Goal 1 (Week 1): Pt will use the RUE as a stabilizer with mod assist during selfcare tasks. OT Short Term Goal 2 (Week 1): Pt will perform RUE AAROM exercises with independence following handout. OT Short Term Goal 3 (Week 1): Pt will perform UB bathing and dressing with supervision, OT Short Term Goal 4 (Week 1): Pt will perform all LB bathing and dressing with supervision including donning right prosthetic.  OT Short Term Goal 5 (Week 1): Pt will perform toilet transfers with supervision using assistive device to elevated toilet or 3:1.  Skilled Therapeutic Interventions/Progress Updates:    Addressed RUE NMRE in therapeutic activities.  Pt. Propelled wc to gym.  Transferred to mat with minimal assist.  Performed closed chain activities with stool, bench.  Pt. Demonstrated moderate weight bearing through RUE.  Did AAROM with shoulder in all planes  Pt tolerated session with no pain.  Propelled wc back to room and left with call bell in place.    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

## 2014-04-06 NOTE — Progress Notes (Signed)
Patient ID: Jordan Arellano, male   DOB: 08/14/1951, 63 y.o.   MRN: [id number] 63 y.o. 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 Patient independent prior to admission living alone. Admitted 03/25/2014 with right-sided weakness and dysarthria. 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 Echocardiogram with ejection fraction 76% grade 1 diastolic dysfunction. Carotid Dopplers with no ICA stenosis.   Subjective/Complaints: Tried ambulating in therapy without device , just supervision Will try outside today  Review of Systems - Negative except R arm weak  Objective: Vital Signs: Blood pressure 132/78, pulse 75, temperature 97.8 F (36.6 C), temperature source Oral, resp. rate 20, height 6\' 1"  (1.854 m), weight 75 kg (165 lb 5.5 oz), SpO2 97.00%. No results found. No results found for this or any previous visit (from the past 72 hour(s)).    Physical Exam  Vitals reviewed.  Eyes: EOM are normal.  Neck: Normal range of motion. Neck supple. No thyromegaly present.  Cardiovascular: Normal rate and regular rhythm.  Respiratory: Effort normal and breath sounds normal. No respiratory distress.  GI: Soft. Bowel sounds are normal. He exhibits no distension.  Neurological: He is alert.  Right-sided facial weakness. Speech is mildly dysarthric  He was oriented x3 and follows simple commands. RUE is trace deltoid/pec and 0/5 distally. RLE 4 HF and KE,Right hamstrings 3-/5 Left side normal. Sensation normal to LT in BUEs, reduced at foot and ankle on Left. Good insight and awareness  Skin: Skin is warm and dry.  Right BKA site is well-healed/shaped  MSK: no pain with shoulder AAROM Psychiatric: He has a normal mood and affect. His behavior is normal.  Thought content normal    Assessment/Plan: 1. Functional deficits secondary to Left pontine infarct with R HP which require 3+ hours per day of interdisciplinary therapy in a comprehensive inpatient rehab setting. Physiatrist is providing close team supervision and 24 hour management of active medical problems listed below. Physiatrist and rehab team continue to assess barriers to discharge/monitor patient progress toward functional and medical goals. PT to address outdoor mobility FIM: FIM - Bathing Bathing Steps Patient Completed: Chest;Right Arm;Abdomen;Front perineal area;Right upper leg;Left upper leg Bathing: 4: Min-Patient completes 8-9 15f 10 parts or 75+ percent  FIM - Upper Body Dressing/Undressing Upper body dressing/undressing steps patient completed: Put head through opening of pull over shirt/dress;Thread/unthread left sleeve of pullover shirt/dress;Thread/unthread right sleeve of pullover shirt/dresss;Pull shirt over trunk Upper body dressing/undressing: 5: Supervision: Safety issues/verbal cues FIM - Lower Body Dressing/Undressing Lower body dressing/undressing steps patient completed: Thread/unthread right underwear leg;Thread/unthread left underwear leg;Pull underwear up/down;Thread/unthread right pants leg;Thread/unthread left pants leg;Pull pants up/down;Don/Doff left sock;Don/Doff left shoe Lower body dressing/undressing: 5: Supervision: Safety issues/verbal cues  FIM - Toileting Toileting steps completed by patient: Adjust clothing prior to toileting;Performs perineal hygiene;Adjust clothing after toileting Toileting Assistive Devices: Grab bar or rail for support Toileting: 4: Steadying assist  FIM - Radio producer Devices: Grab bars;Elevated toilet seat Toilet Transfers: 0-Activity did not occur  FIM - Control and instrumentation engineer Devices: Arm rests;Walker;Prosthesis Bed/Chair Transfer: 0: Activity did not  occur  FIM - Locomotion: Wheelchair Distance: 150 Locomotion: Wheelchair: 5: Travels 150 ft or more: maneuvers on rugs and over door sills with supervision, cueing or coaxing  FIM - Locomotion: Ambulation Locomotion: Ambulation Assistive Devices: Prosthesis;Cane - [employer] Ambulation/Gait Assistance: 5: Supervision Locomotion: Ambulation: 5: Travels 150 ft or more with supervision/safety issues  Comprehension Comprehension Mode: Auditory Comprehension: 6-Follows complex conversation/direction: With extra time/assistive device  Expression Expression Mode: Verbal Expression: 6-Expresses complex ideas: With extra time/assistive device  Social Interaction Social Interaction: 7-Interacts appropriately with others - No medications needed.  Problem Solving Problem Solving: 7-Solves complex problems: Recognizes & self-corrects  Memory Memory: 7-Complete Independence: No helper   Medical Problem List and Plan:  1. 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 DVT Prophylaxis/Anticoagulation: Subcutaneous heparin.bruising and pain at injection sites, amb>150' may D/C 3. Pain Management: Tylenol as needed  4. 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 Patient with prosthesis provided by [employer]  5. Neuropsych: This patient is capable of making decisions on his own behalf.  6. Hypertension. No current antihypertensive medication. Patient on Benicar 40 mg daily prior to admission.BP creeping up will resume 7. Tobacco abuse. NicoDerm patch. Provide counseling    LOS (Days) 9 A FACE TO FACE EVALUATION WAS PERFORMED  Charlett Blake 04/06/2014, 7:05 AM

## 2014-04-06 NOTE — Progress Notes (Signed)
Physical Therapy Session Note  Patient Details  Name: Jordan Arellano MRN: [id number] Date of Birth: Dec 02, 1951  Today's Date: 04/06/2014 Time: 0900-0955 Time Calculation (min): 55 min  Short Term Goals: Week 1:  PT Short Term Goal 1 (Week 1): Pt will perform bed mobility to the R or L with attention and safety to RUE at min assist level  PT Short Term Goal 2 (Week 1): Pt will perform stand pivot transfer with LRAD at min/guard level with safe placement of device PT Short Term Goal 3 (Week 1): Pt will perform dynamic standing balance at min assist PT Short Term Goal 4 (Week 1): Pt will perform w/c mobility using L hemi technique x 150' at supervision level PT Short Term Goal 5 (Week 1): Pt will perform gait with LRAD x 150' at min/guard assist  Skilled Therapeutic Interventions/Progress Updates:  Pt was seen bedside in the am. Pt propelled w/c 200 feet with L UE and LE for strengthening. Pt ambulated 175 feet with LBQC, R prosthesis and S. Pt performed stair taps and alternating stairs taps, 3 sets x 10 reps for strengthening. Nu-step x 10 minutes at level 2. Pt returned to room independently propelling w/c.   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    Locomotion : Ambulation Ambulation/Gait Assistance: 5: Supervision   See FIM for current functional status  Therapy/Group: Individual Therapy  Dub Amis 04/06/2014, 12:36 PM

## 2014-04-07 ENCOUNTER — Inpatient Hospital Stay (HOSPITAL_COMMUNITY): Payer: Medicare Other | Admitting: Physical Therapy

## 2014-04-07 NOTE — Progress Notes (Signed)
Patient ID: Jordan Arellano, male   DOB: [birth year], 63 y.o.   MRN: [id number] 63 y.o. 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 Patient independent prior to admission living alone. Admitted 03/25/2014 with right-sided weakness and dysarthria. 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 Echocardiogram with ejection fraction 08% grade 1 diastolic dysfunction. Carotid Dopplers with no ICA stenosis.   Subjective/Complaints:  Pt without new issues  Review of Systems - Negative except R arm weak  Objective: Vital Signs: Blood pressure 134/78, pulse 72, temperature 98 F (36.7 C), temperature source Oral, resp. rate 18, height 6\' 1"  (1.854 m), weight 75 kg (165 lb 5.5 oz), SpO2 97.00%. No results found. No results found for this or any previous visit (from the past 72 hour(s)).    Physical Exam  Vitals reviewed.  Eyes: EOM are normal.  Neck: Normal range of motion. Neck supple. No thyromegaly present.  Cardiovascular: Normal rate and regular rhythm.  Respiratory: Effort normal and breath sounds normal. No respiratory distress.  GI: Soft. Bowel sounds are normal. He exhibits no distension.  Neurological: He is alert.  Right-sided facial weakness. Speech is mildly dysarthric  He was oriented x3 and follows simple commands. RUE is trace deltoid/pec and 0/5 distally. RLE 4 HF and KE, Left side normal. Sensation normal to LT in BUEs, reduced at foot and ankle on Left. Good insight and awareness  Skin: Skin is warm and dry.  Right BKA site is well-healed/shaped  MSK: no pain with shoulder AAROM Psychiatric: He has a normal mood and affect. His behavior is normal. Thought content normal    Assessment/Plan: 1. Functional deficits secondary to Left  pontine infarct with R HP which require 3+ hours per day of interdisciplinary therapy in a comprehensive inpatient rehab setting. Physiatrist is providing close team supervision and 24 hour management of active medical problems listed below. Physiatrist and rehab team continue to assess barriers to discharge/monitor patient progress toward functional and medical goals.  FIM: FIM - Bathing Bathing Steps Patient Completed: Chest;Right Arm;Abdomen;Front perineal area;Right upper leg;Left upper leg Bathing: 4: Min-Patient completes 8-9 51f 10 parts or 75+ percent  FIM - Upper Body Dressing/Undressing Upper body dressing/undressing steps patient completed: Put head through opening of pull over shirt/dress;Thread/unthread left sleeve of pullover shirt/dress;Thread/unthread right sleeve of pullover shirt/dresss;Pull shirt over trunk Upper body dressing/undressing: 5: Supervision: Safety issues/verbal cues FIM - Lower Body Dressing/Undressing Lower body dressing/undressing steps patient completed: Thread/unthread right underwear leg;Thread/unthread left underwear leg;Pull underwear up/down;Thread/unthread right pants leg;Thread/unthread left pants leg;Pull pants up/down;Don/Doff left sock;Don/Doff left shoe Lower body dressing/undressing: 5: Supervision: Safety issues/verbal cues  FIM - Toileting Toileting steps completed by patient: Adjust clothing prior to toileting;Performs perineal hygiene;Adjust clothing after toileting Toileting Assistive Devices: Grab bar or rail for support Toileting: 5: Supervision: Safety issues/verbal cues  FIM - Radio producer Devices: Grab bars;Elevated toilet seat Toilet Transfers: 0-Activity did not occur  FIM - Control and instrumentation engineer Devices: Arm rests;Prosthesis;Cane Bed/Chair Transfer: 5: Chair or W/C > Bed: Supervision (verbal cues/safety issues);5: Bed > Chair or W/C: Supervision (verbal cues/safety  issues)  FIM - Locomotion: Wheelchair Distance: 150 Locomotion: Wheelchair: 6: Travels 150 ft or more, turns around, maneuvers to table, bed or toilet, negotiates 3%  grade: maneuvers on rugs and over door sills independently FIM - Locomotion: Ambulation Locomotion: Ambulation Assistive Devices: Prosthesis;Cane - [employer] Ambulation/Gait Assistance: 5: Supervision Locomotion: Ambulation: 5: Travels 150 ft or more with supervision/safety issues  Comprehension Comprehension Mode: Auditory Comprehension: 6-Follows complex conversation/direction: With extra time/assistive device  Expression Expression Mode: Verbal Expression: 6-Expresses complex ideas: With extra time/assistive device  Social Interaction Social Interaction: 7-Interacts appropriately with others - No medications needed.  Problem Solving Problem Solving: 7-Solves complex problems: Recognizes & self-corrects  Memory Memory: 7-Complete Independence: No helper   Medical Problem List and Plan:  1. 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 DVT Prophylaxis/Anticoagulation: Subcutaneous heparin.bruising and pain at injection sites, amb>150' may D/C 3. Pain Management: Tylenol as needed  4. 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 Patient with prosthesis provided by [employer]  5. Neuropsych: This patient is capable of making decisions on his own behalf.  6. Hypertension. No current antihypertensive medication. Patient on Benicar 40 mg daily prior to admission.BP creeping up will resume low dose7. Tobacco abuse. NicoDerm patch. Provide counseling    LOS (Days) [place] EVALUATION WAS PERFORMED  Charlett Blake 04/07/2014, 7:26 AM

## 2014-04-07 NOTE — Discharge Summary (Signed)
Discharge summary job # [id number]

## 2014-04-07 NOTE — Progress Notes (Signed)
Physical Therapy Session Note  Patient Details  Name: Jordan Arellano MRN: [id number] Date of Birth: [birth year]  Today's Date: 04/07/2014 Time: 1001-1056 Time Calculation (min): 55 min  Short Term Goals: Week 1:  PT Short Term Goal 1 (Week 1): Pt will perform bed mobility to the R or L with attention and safety to RUE at min assist level  PT Short Term Goal 2 (Week 1): Pt will perform stand pivot transfer with LRAD at min/guard level with safe placement of device PT Short Term Goal 3 (Week 1): Pt will perform dynamic standing balance at min assist PT Short Term Goal 4 (Week 1): Pt will perform w/c mobility using L hemi technique x 150' at supervision level PT Short Term Goal 5 (Week 1): Pt will perform gait with LRAD x 150' at min/guard assist  Skilled Therapeutic Interventions/Progress Updates:  Pt was seen bedside in the am. Pt propelled w/c to gym about 200 feet mod I using L UE and LE. Pt ambulated about 200 feet with LBQC and prosthesis, required S. Nu step x 15 minutes at level 2. Pt sidestepped 20 feet x 2 in both directions with min guard and LBQC. Pt ambulated 20 feet forward and backward x 2 with min guard. Pt returned to room propeling w/c mod I.    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    Locomotion : Ambulation Ambulation/Gait Assistance: 5: Supervision   See FIM for current functional status  Therapy/Group: Individual Therapy  Dub Amis 04/07/2014, 12:37 PM

## 2014-04-08 ENCOUNTER — Inpatient Hospital Stay (HOSPITAL_COMMUNITY): Payer: Medicare Other | Admitting: Rehabilitation

## 2014-04-08 ENCOUNTER — Encounter (HOSPITAL_COMMUNITY): Payer: Medicare Other | Admitting: Occupational Therapy

## 2014-04-08 DIAGNOSIS — S88119A Complete traumatic amputation at level between knee and ankle, unspecified lower leg, initial encounter: Secondary | ICD-10-CM

## 2014-04-08 DIAGNOSIS — G8110 Spastic hemiplegia affecting unspecified side: Secondary | ICD-10-CM

## 2014-04-08 MED ORDER — NICOTINE 14 MG/24HR TD PT24: MEDICATED_PATCH | TRANSDERMAL | Status: DC

## 2014-04-08 MED ORDER — OLMESARTAN MEDOXOMIL 40 MG PO TABS: 40.0000 mg | ORAL_TABLET | Freq: Every day | ORAL | Status: DC

## 2014-04-08 MED ORDER — ATORVASTATIN CALCIUM 20 MG PO TABS: 20.0000 mg | ORAL_TABLET | Freq: Every day | ORAL | Status: DC

## 2014-04-08 MED ORDER — ASPIRIN 325 MG PO TABS: 325.0000 mg | ORAL_TABLET | Freq: Every day | ORAL | Status: DC

## 2014-04-08 NOTE — Discharge Summary (Signed)
Jordan Arellano, Jordan Arellano               ACCOUNT NO.:  [id number]  MEDICAL RECORD NO.:  [id number]  LOCATION:  4M09C                        FACILITY:  [facility]  PHYSICIAN:  Lauraine Rinne, P.A.  DATE OF BIRTH:  Feb 22, 1951  DATE OF ADMISSION:  03/28/2014 DATE OF DISCHARGE:  04/08/2014                              DISCHARGE SUMMARY   DISCHARGE DIAGNOSES: 1. Thrombotic left pontine infarct. 2. Subcutaneous heparin for DVT prophylaxis. 3. History of right below-knee amputation 13 years ago. 4. Hypertension.  HISTORY OF PRESENT ILLNESS:  This is a 63 year old right-handed male with history of hypertension, right BKA 13 years ago and uses a prosthesis.  Patient was independent prior to admission, living alone. Admitted on March 25, 2014 for right-sided weakness and slurred speech. 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  MRA of the head with left vertebral artery occlusion.  Echocardiogram with ejection fraction of 25% grade 1 diastolic dysfunction.  Carotid Dopplers with no ICA stenosis.  Patient did not receive tPA.  Placed on aspirin for CVA prophylaxis as well as subcutaneous heparin.  Nicoderm patch added for tobacco abuse.  He was tolerating a regular diet.  Physical and occupational therapy ongoing. Patient was admitted for a comprehensive rehab program.  PAST MEDICAL HISTORY:  See discharge diagnoses.  SOCIAL HISTORY:  Lives alone.  FUNCTIONAL HISTORY:  Prior to admission, independent, using a prosthesis.  Functional status upon admission to rehab services was moderate assist for stand, pivot transfers, needing assist for overall bed mobility; min-mod assist, activities of daily living.  PHYSICAL EXAMINATION:  VITAL SIGNS:  Blood pressure 132/73, pulse 64, temperature 97, respirations 18. GENERAL:  This was an alert male, right-sided facial weakness.   Speech was dysarthric but intelligible. HEENT:  Pupils were round and reactive to light. LUNGS:  Clear to auscultation. CARDIAC:  Regular rate and rhythm. ABDOMEN:  Soft, nontender.  Good bowel sounds. EXTREMITIES:  Right below-knee amputation site was well healed.  REHABILITATION HOSPITAL COURSE:  Patient was admitted to inpatient rehab services with therapies initiated on a 3-hour daily basis consisting of physical therapy, occupational therapy, and rehabilitation nursing.  The following issues were addressed during patient's rehabilitation stay. Pertaining to Mr. Lemelin thrombotic left pontine infarct remained stable, maintained on aspirin therapy.  He would follow up with Neurology Services.  He had been on subcutaneous heparin for DVT prophylaxis.  Discontinued as patient was ambulatory.  He had a history of right below-knee amputation 13 years ago.  He was independent with his prosthesis.  Blood pressures remained well controlled on no current antihypertensive medication.  He had been on low-dose Benicar.  It was resumed.  He had a history of tobacco abuse, maintained on a Nicoderm patch.  He received full counseling in regard to cessation of nicotine products.  It was questionable if he would be compliant with these requests.  Patient received weekly collaborative interdisciplinary team conferences to discuss estimated length of stay, family teaching, and any barriers to discharge.  He can propel his wheelchair independently, ambulating 200 feet with large base quad cane and prosthesis in mere  supervision.  Side stepping with minimal guard using a large base quad cane.  Supervision for activities of daily living.  Full teaching was completed and plan was to be discharged to home with ongoing therapies dictated as per rehab services.  DISCHARGE MEDICATIONS: 1. Aspirin 325 mg p.o. daily. 2. Lipitor 20 mg p.o. daily. 3. Avapro 75 mg p.o. daily. 4. Nicoderm patch 14 mg taper  as advised.  DIET:  Regular.  SPECIAL INSTRUCTIONS:  Patient would follow up as an outpatient with Dr. Alysia Penna on May 13, 2014; Dr. Antony Contras in 1 month, call for appointment; Dr. York Ram at the [facility] [place] medical management.  Ongoing therapies were arranged as per rehab services.     Lauraine Rinne, P.A.     DA/MEDQ  D:  04/07/2014  T:  04/08/2014  Job:  [id number]  cc:   York Ram, MD Pramod P. Leonie Man, MD Charlett Blake, M.D.

## 2014-04-08 NOTE — Progress Notes (Signed)
Physical Therapy Discharge Summary  Patient Details  Name: Jordan Arellano MRN: [id number] Date of Birth: 08/22/1951  Today's Date: 04/08/2014 Time: 6546-5035 Time Calculation (min): 65 min  Patient has met 9 of 10 long term goals due to improved activity tolerance, improved balance, improved postural control, increased strength, ability to compensate for deficits, functional use of  right lower extremity, improved awareness and improved coordination.  Patient to discharge at an ambulatory level Modified Independent.   Pts family did not participate in formal family training as he is leaving at Mod I level, however son was present during last session to observe how he is moving.    Reasons goals not met: Pt did not meet floor transfer goal, as he requires up to mod assist to perform.  He did not want to try transfer again on grad day, therefore goal was unmet.    Recommendation:  Patient will benefit from ongoing skilled PT services in home health setting to continue to advance safe functional mobility, address ongoing impairments in decreased balance, decreased strength in RLE, decreased functional use of RUE, decreased coordination, and minimize fall risk.  Equipment: LBQC and w/c, cushion  Reasons for discharge: treatment goals met and discharge from hospital  Patient/family agrees with progress made and goals achieved: Yes  PT treatment/Intervention:   Pt received sitting in w/c in room, agreeable to therapy session.  Performed >200' w/c mobility using L hemi technique in controlled and home environment at Mod I level.  Then ambulated another 76' with LBQC at Mod I level in controlled, busy hallway and home environment.  Once in therapy gym, performed stairs with son now present to observe pts progress.  Pts son states he installed two handrails, therefore would be able to ascend/descend with handrails and set cane on top of step into house.  Performed 2, 6" steps at Mod I level.  Then  ambulated to ortho gym in order to perform car transfer.  Pt able to do so at Mod I level.  Once in gym, discussed HHPT, equipment, safety at home and not ambulating outside by himself yet, fall recovery and keeping phone on him at all times in case of fall.  Discussed when to call 911 again vs when to attempt to get up, however feel that he would not be able to get up on his own at this time.  Ambulated back to ADL apt and performed furniture transfer at [place] session with BERG balance test.  Scored 31/56, placing pt at significant risk for falling, however also discussed that this gives HHPT things to work on and also need for [facility].  Pt and son verbalized understanding.  Provided pt with OTAGO HEP for home use and discussed techniques and frequency of exercises.  Both verbalized understanding.  Pt left in room with son present and RN in room.    PT Discharge Precautions/Restrictions Precautions Precautions: Fall Precaution Comments: 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 Restrictions Weight Bearing Restrictions: No   Pain Pain Assessment Pain Assessment: No/denies pain Pain Score: 0-No pain    Cognition Overall Cognitive Status: Within Functional Limits for tasks assessed Arousal/Alertness: Awake/alert Orientation Level: Oriented X4 Attention: Alternating Focused Attention: Appears intact Sustained Attention: Appears intact Alternating Attention: Appears intact Memory: Appears intact Awareness: Appears intact Safety/Judgment: Appears intact Sensation Sensation Light Touch: Appears Intact Stereognosis: Not tested Hot/Cold: Not tested Proprioception: Appears Intact Coordination Gross Motor Movements are Fluid and Coordinated: No Fine Motor Movements are Fluid and  Coordinated: No Coordination and Movement Description: Pt continues to have slight difficulty with placement of RLE when stepping.  Note it is much improved, therefore difficult to assess whether true  coordination issues or weakness in RLE.   Motor  Motor Motor: Hemiplegia Motor - Discharge Observations: Pt continues to have little functional movement in RUE, decreased strength in RLE and tends to avoid RLE when standing.    Mobility Bed Mobility Bed Mobility: Supine to Sit;Sit to Supine Supine to Sit: 6: Modified independent (Device/Increase time) Sit to Supine: 6: Modified independent (Device/Increase time) Transfers Transfers: Yes Sit to Stand: 6: Modified independent (Device/Increase time) Stand to Sit: 6: Modified independent (Device/Increase time) Stand Pivot Transfers: 6: Modified independent (Device/Increase time) Locomotion  Ambulation Ambulation: Yes Ambulation/Gait Assistance: 6: Modified independent (Device/Increase time) Ambulation Distance (Feet): 165 Feet Assistive device: Large base quad cane Gait Gait: Yes Gait Pattern: Impaired Gait Pattern: Step-through pattern;Decreased stance time - right;Decreased hip/knee flexion - right;Lateral trunk lean to right;Trunk flexed;Narrow base of support Stairs / Additional Locomotion Stairs: Yes Stairs Assistance: 6: Modified independent (Device/Increase time) Stair Management Technique: One rail Left;Step to pattern;Forwards Number of Stairs: 2 Height of Stairs: 6 Wheelchair Mobility Wheelchair Mobility: Yes Wheelchair Assistance: 6: Modified independent (Device/Increase time) Wheelchair Propulsion: Left lower extremity;Left upper extremity Wheelchair Parts Management: Independent Distance: 200  Trunk/Postural Assessment  Cervical Assessment Cervical Assessment: Within Functional Limits Thoracic Assessment Thoracic Assessment: Within Functional Limits Lumbar Assessment Lumbar Assessment: Within Functional Limits Postural Control Postural Limitations: Mostly WFL, however he does tend to sit with rounded shoulders and forward head.   Balance Balance Balance Assessed: Yes Standardized Balance  Assessment Standardized Balance Assessment: Berg Balance Test Berg Balance Test Sit to Stand: Able to stand  independently using hands Standing Unsupported: Able to stand safely 2 minutes Sitting with Back Unsupported but Feet Supported on Floor or Stool: Able to sit safely and securely 2 minutes Stand to Sit: Sits safely with minimal use of hands Transfers: Able to transfer safely, definite need of hands Standing Unsupported with Eyes Closed: Able to stand 10 seconds safely Standing Ubsupported with Feet Together: Able to place feet together independently and stand for 1 minute with supervision From Standing, Reach Forward with Outstretched Arm: Can reach forward >5 cm safely (2") From Standing Position, Pick up Object from Floor: Unable to try/needs assist to keep balance From Standing Position, Turn to Look Behind Over each Shoulder: Turn sideways only but maintains balance Turn 360 Degrees: Needs close supervision or verbal cueing Standing Unsupported, Alternately Place Feet on Step/Stool: Needs assistance to keep from falling or unable to try Standing Unsupported, One Foot in Front: Needs help to step but can hold 15 seconds Standing on One Leg: Unable to try or needs assist to prevent fall Total Score: 31 Static Sitting Balance Static Sitting - Balance Support: Feet supported Static Sitting - Level of Assistance: 6: Modified independent (Device/Increase time) Dynamic Sitting Balance Dynamic Sitting - Balance Support: Right upper extremity supported;Feet supported Dynamic Sitting - Level of Assistance: 6: Modified independent (Device/Increase time) Static Standing Balance Static Standing - Balance Support: During functional activity;Left upper extremity supported Static Standing - Level of Assistance: 6: Modified independent (Device/Increase time) Dynamic Standing Balance Dynamic Standing - Balance Support: During functional activity Dynamic Standing - Level of Assistance: 6:  Modified independent (Device/Increase time) Extremity Assessment      RLE Assessment RLE Assessment: Exceptions to [facility] RLE Strength RLE Overall Strength: Deficits RLE Overall Strength Comments: hip flex 3-/5, hip abd 2+/5,  hip add 2+/5, knee flex 4/5, knee ext 3+/5 (had prosthesis on) LLE Assessment LLE Assessment: Within Functional Limits  See FIM for current functional status  Raquel Sarna A  04/08/2014, 12:35 PM

## 2014-04-08 NOTE — Progress Notes (Signed)
Social Work Discharge Note Discharge Note  The overall goal for the admission was met for:   Discharge location: Yes-HOME WITH INTERMITTENT ASSIST FROM SON AND AUNT & UNCLE  Length of Stay: Yes-11 DAYS  Discharge activity level: Yes-MOD/I LEVEL  Home/community participation: Yes  Services provided included: MD, RD, PT, OT, SLP, RN, CM, TR, Pharmacy, Neuropsych and SW  Financial Services: Private Insurance: [employer]-MED[employer]ARE  Follow-up services arranged: Home Health: [facility]-PT,OT,RN, DME: [facility]-WHEELCHAIR, LBQC, [facility] and Patient/Family has no preference for HH/DME agencies  Comments (or additional information):PT REACHED GOALS AND IS READY FOR DISCHARGE. FEELS GOOD ABOUT GOALS AND LEVELS  Patient/Family verbalized understanding of follow-up arrangements: Yes  Individual responsible for coordination of the follow-up plan: SELF  Confirmed correct DME delivered:  G  04/08/2014     G  

## 2014-04-08 NOTE — Progress Notes (Signed)
Patient ID: Jordan Arellano, male   DOB: 04-17-1951, 63 y.o.   MRN: [id number]   Subjective/Complaints:  Pt without new issues  Review of Systems - Negative except R arm weak  Objective: Vital Signs: Blood pressure 141/87, pulse 69, temperature 97.9 F (36.6 C), temperature source Oral, resp. rate 16, height 6\' 1"  (1.854 m), weight 75 kg (165 lb 5.5 oz), SpO2 98.00%. No results found. No results found for this or any previous visit (from the past 72 hour(s)).    Physical Exam  Vitals reviewed.  Eyes: EOM are normal.  Neck: Normal range of motion. Neck supple. No thyromegaly present.  Cardiovascular: Normal rate and regular rhythm.  Respiratory: Effort normal and breath sounds normal. No respiratory distress.  GI: Soft. Bowel sounds are normal. He exhibits no distension.  Neurological: He is alert.  Right-sided facial weakness. Speech is intelligible  He was oriented x3 and follows simple commands. RUE is trace deltoid/pec, 2- biceps and 0/5 distally. RLE 4 HF and KE, Left side normal. Sensation normal to LT in BUEs, reduced at foot and ankle on Left. Good insight and awareness  Skin: Skin is warm and dry.  Right BKA site is well-healed/shaped  MSK: no pain with shoulder AAROM Psychiatric: He has a normal mood and affect. His behavior is normal. Thought content normal    Assessment/Plan: 1. Functional deficits secondary to Left pontine infarct with R HP which require 3+ hours per day of interdisciplinary therapy in a comprehensive inpatient rehab setting. Stable for D/C today F/u PCP in 1-2 weeks F/u PM&R 3 weeks F/u Neuro 1-2 mo See D/C summary See D/C instructions  FIM: FIM - Bathing Bathing Steps Patient Completed: Chest;Right Arm;Abdomen;Front perineal area;Right upper leg;Left upper leg Bathing: 4: Min-Patient completes 8-9 46f 10 parts or 75+ percent  FIM - Upper Body Dressing/Undressing Upper body dressing/undressing steps patient completed: Put head through opening of  pull over shirt/dress;Thread/unthread left sleeve of pullover shirt/dress;Thread/unthread right sleeve of pullover shirt/dresss;Pull shirt over trunk Upper body dressing/undressing: 5: Supervision: Safety issues/verbal cues FIM - Lower Body Dressing/Undressing Lower body dressing/undressing steps patient completed: Thread/unthread right underwear leg;Thread/unthread left underwear leg;Pull underwear up/down;Thread/unthread right pants leg;Thread/unthread left pants leg;Pull pants up/down;Don/Doff left sock;Don/Doff left shoe Lower body dressing/undressing: 5: Supervision: Safety issues/verbal cues  FIM - Toileting Toileting steps completed by patient: Adjust clothing prior to toileting;Performs perineal hygiene;Adjust clothing after toileting Toileting Assistive Devices: Grab bar or rail for support Toileting: 5: Supervision: Safety issues/verbal cues  FIM - Radio producer Devices: Grab bars;Elevated toilet seat Toilet Transfers: 0-Activity did not occur  FIM - Control and instrumentation engineer Devices: Arm rests;Prosthesis;Cane Bed/Chair Transfer: 5: Chair or W/C > Bed: Supervision (verbal cues/safety issues);5: Bed > Chair or W/C: Supervision (verbal cues/safety issues)  FIM - Locomotion: Wheelchair Distance: 150 Locomotion: Wheelchair: 6: Travels 150 ft or more, turns around, maneuvers to table, bed or toilet, negotiates 3% grade: maneuvers on rugs and over door sills independently FIM - Locomotion: Ambulation Locomotion: Ambulation Assistive Devices: Cane - Quad;Prosthesis Ambulation/Gait Assistance: 5: Supervision Locomotion: Ambulation: 5: Travels 150 ft or more with supervision/safety issues  Comprehension Comprehension Mode: Auditory Comprehension: 6-Follows complex conversation/direction: With extra time/assistive device  Expression Expression Mode: Verbal Expression: 6-Expresses complex ideas: With extra time/assistive  device  Social Interaction Social Interaction: 7-Interacts appropriately with others - No medications needed.  Problem Solving Problem Solving: 7-Solves complex problems: Recognizes & self-corrects  Memory Memory: 7-Complete Independence: No helper   Medical Problem List and Plan:  1. 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 DVT Prophylaxis/Anticoagulation: Subcutaneous heparin.bruising and pain at injection sites, amb>150' may D/C 3. Pain Management: Tylenol as needed  4. 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 Patient with prosthesis provided by [employer] prosthetics  5. Neuropsych: This patient is capable of making decisions on his own behalf.  6. Hypertension. No current antihypertensive medication. Patient on Benicar 40 mg daily prior to admission.BP creeping up will resume low dose7. Tobacco abuse. NicoDerm patch. Provide counseling    LOS (Days) 11 A FACE TO FACE EVALUATION WAS PERFORMED  Charlett Blake 04/08/2014, 8:11 AM

## 2014-04-08 NOTE — Progress Notes (Signed)
Occupational Therapy Discharge Summary  Patient Details  Name: Jordan Arellano MRN: [id number] Date of Birth: [birth year]  Today's Date: 04/08/2014 Time: 0900-0957 Time Calculation (min): 57 min  Patient has met 12 of 12 long term goals due to improved balance, ability to compensate for deficits and functional use of  RIGHT upper extremity.  Patient to discharge at overall Supervision level.  Patient's care partner unavailable to provide the necessary physical assistance at discharge.    Reasons goals not met: NA  Recommendation:  Patient will benefit from ongoing skilled OT services in home health setting to continue to advance functional skills in the area of BADL.  Pt has made excellent progress with OT at this point to a modified independent level for most selfcare tasks and supervision for shower transfers.  He continues to demonstrate significant impairment in the RUE with functional use and needs max to total assist to integrate as an active assist with most selfcare tasks.  Recommend supervision for showering as well as continued HHOT to further progress with ADLs and RUE use.   Equipment: 3:1  Reasons for discharge: treatment goals met and discharge from hospital  Patient/family agrees with progress made and goals achieved: Yes  OT Discharge Precautions/Restrictions  Precautions Precautions: Fall Precaution Comments: 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 Restrictions Weight Bearing Restrictions: No  Pain Pain Assessment Pain Assessment: No/denies pain Pain Score: 0-No pain ADL  See FIM scale for details  Vision/Perception  Vision- History Baseline Vision/History: Wears glasses Wears Glasses: Reading only Vision- Assessment Vision Assessment?: No apparent visual deficits No visual deficts  Cognition Overall Cognitive Status: Within Functional Limits for tasks assessed Arousal/Alertness: Awake/alert Orientation Level: Oriented X4 Attention:  Alternating Focused Attention: Appears intact Sustained Attention: Appears intact Selective Attention: Appears intact Alternating Attention: Appears intact Memory: Appears intact Awareness: Appears intact Problem Solving: Appears intact Safety/Judgment: Appears intact Sensation Sensation Light Touch: Appears Intact Stereognosis: Not tested Hot/Cold: Not tested Proprioception: Appears Intact Coordination Gross Motor Movements are Fluid and Coordinated: No Fine Motor Movements are Fluid and Coordinated: No Coordination and Movement Description: Pt is currently Brunnstrum stage I movement in the right hand and stage II in the arm.  Needs total hand over hand assist to integrate into funciton.  Motor  Motor Motor: Hemiplegia Motor - Discharge Observations: Pt continues to demonstrate very little active movement at this time in the RUE and hand. Mobility  Bed Mobility Bed Mobility: Supine to Sit;Sit to Supine Supine to Sit: 6: Modified independent (Device/Increase time) Supine to Sit Details: Verbal cues for technique;Manual facilitation for weight shifting Sit to Supine: 6: Modified independent (Device/Increase time) Sit to Supine - Details: Manual facilitation for weight shifting;Verbal cues for technique Transfers Transfers: Sit to Stand Sit to Stand: 6: Modified independent (Device/Increase time) Sit to Stand Details: Verbal cues for sequencing;Verbal cues for technique;Verbal cues for precautions/safety;Manual facilitation for weight shifting;Manual facilitation for weight bearing Stand to Sit: 6: Modified independent (Device/Increase time) Stand to Sit Details (indicate cue type and reason): Verbal cues for sequencing;Verbal cues for technique;Verbal cues for precautions/safety;Manual facilitation for weight shifting;Manual facilitation for weight bearing  Trunk/Postural Assessment  Cervical Assessment Cervical Assessment: Within Functional Limits Thoracic Assessment Thoracic  Assessment: Within Functional Limits Thoracic Strength Overall Thoracic Strength Comments: Pt with slight thoracic kyposis in sitting. Lumbar Assessment Lumbar Assessment: Within Functional Limits Postural Control Postural Limitations: Mostly WFL, however he does tend to sit with rounded shoulders and forward head.   Balance Static Sitting Balance Static Sitting -  Balance Support: Feet supported Static Sitting - Level of Assistance: 6: Modified independent (Device/Increase time) Dynamic Sitting Balance Dynamic Sitting - Balance Support: Right upper extremity supported;Feet supported Dynamic Sitting - Level of Assistance: 6: Modified independent (Device/Increase time) Static Standing Balance Static Standing - Balance Support: During functional activity;Left upper extremity supported Static Standing - Level of Assistance: 6: Modified independent (Device/Increase time) Dynamic Standing Balance Dynamic Standing - Balance Support: During functional activity Dynamic Standing - Level of Assistance: 6: Modified independent (Device/Increase time) Extremity/Trunk Assessment RUE Assessment RUE Assessment: Exceptions to [facility] RUE Strength RUE Overall Strength Comments: Pt currently demonstrates Brunnstrum I in the right hand and stage II in the arm.  He needs max to total assist to integrate the RUE with functional use.  PROM WFLs for all joints. LUE Assessment LUE Assessment: Within Functional Limits  See FIM for current functional status  Cindra Presume OTR/L 04/08/2014, 4:16 PM

## 2014-04-08 NOTE — Progress Notes (Signed)
Pt discharged to home at 1155. Discharge instructions given by Marlowe Shores, PA with verbal understanding. Equipment with pt.

## 2014-04-08 NOTE — Plan of Care (Signed)
Problem: RH Floor Transfers Goal: LTG Patient will perform floor transfers w/assist (PT) LTG: Patient will perform floor transfers with assistance (PT).  Outcome: Not Met (add Reason) Pt requires up to mod assist to elevate hips into tall kneeling position, therefore did not meet goal.      

## 2014-04-12 ENCOUNTER — Telehealth: Payer: Self-pay

## 2014-04-12 NOTE — Telephone Encounter (Signed)
Diane an occupational therapist with [facility] called to get order for a social worker to help patient with community resources.

## 2014-04-12 NOTE — Telephone Encounter (Signed)
Left message for Diane approving social worker order.

## 2014-04-19 ENCOUNTER — Telehealth: Payer: Self-pay

## 2014-04-19 NOTE — Telephone Encounter (Signed)
Diane (OT @ [facility]) is requesting a verbal order to increase visits from 2x a week to 3x a weeks for 2 more weeks. Give her the verbal okay per protocol.

## 2014-04-25 DIAGNOSIS — I69959 Hemiplegia and hemiparesis following unspecified cerebrovascular disease affecting unspecified side: Secondary | ICD-10-CM | POA: Diagnosis not present

## 2014-04-25 DIAGNOSIS — I10 Essential (primary) hypertension: Secondary | ICD-10-CM | POA: Diagnosis not present

## 2014-04-25 DIAGNOSIS — S88119A Complete traumatic amputation at level between knee and ankle, unspecified lower leg, initial encounter: Secondary | ICD-10-CM | POA: Diagnosis not present

## 2014-05-13 ENCOUNTER — Encounter: Payer: Medicare Other | Attending: Physical Medicine & Rehabilitation

## 2014-05-13 ENCOUNTER — Inpatient Hospital Stay: Payer: Medicare Other | Admitting: Physical Medicine & Rehabilitation

## 2014-06-17 ENCOUNTER — Encounter (HOSPITAL_COMMUNITY): Payer: Self-pay | Admitting: Emergency Medicine

## 2014-06-17 ENCOUNTER — Emergency Department (HOSPITAL_COMMUNITY)
Admission: EM | Admit: 2014-06-17 | Discharge: 2014-06-17 | Disposition: A | Discharge status: Home / Self Care | Payer: Medicare Other | Attending: Emergency Medicine | Admitting: Emergency Medicine

## 2014-06-17 DIAGNOSIS — Z79899 Other long term (current) drug therapy: Secondary | ICD-10-CM | POA: Insufficient documentation

## 2014-06-17 DIAGNOSIS — N390 Urinary tract infection, site not specified: Secondary | ICD-10-CM

## 2014-06-17 DIAGNOSIS — H6120 Impacted cerumen, unspecified ear: Secondary | ICD-10-CM | POA: Insufficient documentation

## 2014-06-17 DIAGNOSIS — I10 Essential (primary) hypertension: Secondary | ICD-10-CM | POA: Insufficient documentation

## 2014-06-17 DIAGNOSIS — Z7982 Long term (current) use of aspirin: Secondary | ICD-10-CM | POA: Insufficient documentation

## 2014-06-17 DIAGNOSIS — Z8673 Personal history of transient ischemic attack (TIA), and cerebral infarction without residual deficits: Secondary | ICD-10-CM | POA: Insufficient documentation

## 2014-06-17 DIAGNOSIS — F17200 Nicotine dependence, unspecified, uncomplicated: Secondary | ICD-10-CM | POA: Insufficient documentation

## 2014-06-17 LAB — COMPREHENSIVE METABOLIC PANEL
ALBUMIN: 3.9 g/dL (ref 3.5–5.2)
ALK PHOS: 72 U/L (ref 39–117)
ALT: 23 U/L (ref 0–53)
ANION GAP: 15 (ref 5–15)
AST: 20 U/L (ref 0–37)
BUN: 19 mg/dL (ref 6–23)
CO2: 23 mEq/L (ref 19–32)
CREATININE: 0.84 mg/dL (ref 0.50–1.35)
Calcium: 9.4 mg/dL (ref 8.4–10.5)
Chloride: 102 mEq/L (ref 96–112)
GFR calc Af Amer: >90 mL/min (ref >90)
GFR calc non Af Amer: >90 mL/min (ref >90)
Glucose, Bld: 144 mg/dL — ABNORMAL HIGH (ref 70–99)
Potassium: 4 mEq/L (ref 3.7–5.3)
Sodium: 140 mEq/L (ref 137–147)
TOTAL PROTEIN: 8 g/dL (ref 6.0–8.3)
Total Bilirubin: 0.4 mg/dL (ref 0.3–1.2)

## 2014-06-17 LAB — URINALYSIS, ROUTINE W REFLEX MICROSCOPIC
BILIRUBIN URINE: NEGATIVE
Glucose, UA: NEGATIVE mg/dL
KETONES UR: NEGATIVE mg/dL
NITRITE: NEGATIVE
Protein, ur: 30 mg/dL — AB
SPECIFIC GRAVITY, URINE: 1.009 (ref 1.005–1.030)
UROBILINOGEN UA: 1 mg/dL (ref 0.0–1.0)
pH: 6.5 (ref 5.0–8.0)

## 2014-06-17 LAB — CBC
HEMATOCRIT: 43.1 % (ref 39.0–52.0)
HEMOGLOBIN: 14.7 g/dL (ref 13.0–17.0)
MCH: 30.6 pg (ref 26.0–34.0)
MCHC: 34.1 g/dL (ref 30.0–36.0)
MCV: 89.8 fL (ref 78.0–100.0)
Platelets: 228 10*3/uL (ref 150–400)
RBC: 4.8 MIL/uL (ref 4.22–5.81)
RDW: 15.2 % (ref 11.5–15.5)
WBC: 7.9 10*3/uL (ref 4.0–10.5)

## 2014-06-17 LAB — URINE MICROSCOPIC-ADD ON

## 2014-06-17 LAB — CBG MONITORING, ED: Glucose-Capillary: 100 mg/dL — ABNORMAL HIGH (ref 70–99)

## 2014-06-17 MED ORDER — CEPHALEXIN 500 MG PO CAPS: 500.0000 mg | ORAL_CAPSULE | Freq: Two times a day (BID) | ORAL | Status: AC

## 2014-06-17 NOTE — ED Notes (Signed)
Both Ears stopped up cannot hear and when he stands x 3 days   he gets dizzy when he stands  X 3 days also, face rt side has broken out in rash not itching  Denies cp  Or head injury  Does have hx  Stroke  X 1 mont h ago  Has not seen neuro dr since

## 2014-06-17 NOTE — ED Notes (Signed)
CBG 100 

## 2014-06-17 NOTE — ED Notes (Signed)
Patient tolerated ear was removal without incident states only same to slight relief.

## 2014-06-17 NOTE — ED Provider Notes (Signed)
CSN: [id number]     Arrival date & time 06/17/14  1108 History   First MD Initiated Contact with Patient 06/17/14 1316     Chief Complaint  Patient presents with  . Otalgia  . Dizziness     (Consider location/radiation/quality/duration/timing/severity/associated sxs/prior Treatment) HPI  63 year old male with past medical history of stroke and TIA that presents with decreased hearing and light-headness. Patient states that he feels moderate lightheaded with standing and with head movement, that resolves after position change. Patient also states his head he has had decreased hearing for the past 3 days. Patient denies any trauma or being around loud sounds such as a shooting range. Patient denies any chest pain shortness of breath. Patient denies any gait disturbances. And he is able to walk at his baseline with some support,  Patient does have baseline deficit secondary to stroke and walks with a cane. Patient can stand without assistance.    Past Medical History  Diagnosis Date  . Hypertension   . TIA (transient ischemic attack)   . Stroke    Past Surgical History  Procedure Laterality Date  . Ambutation    . Below knee leg amputation Right    Family History  Problem Relation Age of Onset  . Hypertension Mother   . Hypertension Father    History  Substance Use Topics  . Smoking status: Current Some Day Smoker -- 0.50 packs/day for 35 years    Types: Cigarettes  . Smokeless tobacco: Never Used  . Alcohol Use: No    Review of Systems  Constitutional: Negative for activity change.  HENT: Negative for congestion.        Decreased hearing  Eyes: Negative for visual disturbance.  Respiratory: Negative for cough and shortness of breath.   Cardiovascular: Negative for chest pain and leg swelling.  Gastrointestinal: Negative for abdominal pain and blood in stool.  Genitourinary: Negative for dysuria and hematuria.  Musculoskeletal: Negative for back pain.  Skin: Negative for  color change.  Neurological: Positive for light-headedness. Negative for syncope and headaches.  Psychiatric/Behavioral: Negative for agitation.      Allergies  Review of patient's allergies indicates no known allergies.  Home Medications   Prior to Admission medications   Medication Sig Start Date End Date Taking? Authorizing Provider  amLODipine (NORVASC) 5 MG tablet  05/16/14   Historical Provider, MD  aspirin 325 MG tablet Take 1 tablet (325 mg total) by mouth daily. 04/08/14   Lavon Paganini Angiulli, PA-C  atorvastatin (LIPITOR) 20 MG tablet Take 1 tablet (20 mg total) by mouth daily at 6 PM. 04/08/14   Lavon Paganini Angiulli, PA-C  hydrochlorothiazide (HYDRODIURIL) 25 MG tablet  05/16/14   Historical Provider, MD  nicotine (NICODERM CQ - DOSED IN MG/24 HOURS) 14 mg/24hr patch 14 mg patch daily x2 weeks then 7 mg patch daily x3 weeks and stop. 04/08/14   Lavon Paganini Angiulli, PA-C  olmesartan (BENICAR) 40 MG tablet Take 1 tablet (40 mg total) by mouth daily. 04/08/14   Daniel J Angiulli, PA-C   BP 147/80  Pulse 82  Temp(Src) 98.6 F (37 C)  Resp 16  SpO2 100% Physical Exam  Nursing note and vitals reviewed. Constitutional: He is oriented to person, place, and time. He appears well-developed and well-nourished.  Right TM clear, no evidence of perforation   Left TM impacted with cerumen   HENT:  Head: Normocephalic.  Eyes: Pupils are equal, round, and reactive to light.  Neck: Neck supple.  Cardiovascular: Normal rate  and regular rhythm.  Exam reveals no gallop and no friction rub.   No murmur heard. Pulmonary/Chest: Effort normal. No respiratory distress.  Abdominal: Soft. He exhibits no distension. There is no tenderness.  Musculoskeletal: He exhibits no edema.  Neurological: He is alert and oriented to person, place, and time.  Skin: Skin is warm.  Psychiatric: He has a normal mood and affect.    ED Course  Procedures (including critical care time) Labs Review Labs Reviewed   COMPREHENSIVE METABOLIC PANEL - Abnormal; Notable for the following:    Glucose, Bld 144 (*)    All other components within normal limits  URINALYSIS, ROUTINE W REFLEX MICROSCOPIC - Abnormal; Notable for the following:    APPearance HAZY (*)    Hgb urine dipstick TRACE (*)    Protein, ur 30 (*)    Leukocytes, UA LARGE (*)    All other components within normal limits  URINE MICROSCOPIC-ADD ON - Abnormal; Notable for the following:    Bacteria, UA MANY (*)    All other components within normal limits  CBG MONITORING, ED - Abnormal; Notable for the following:    Glucose-Capillary 100 (*)    All other components within normal limits  CBC    Imaging Review No results found.   EKG Interpretation None      MDM   Final diagnoses:  UTI (lower urinary tract infection)   63 year old male with past medical history of stroke presents with decreased hearing and cerumen impaction. Patient does feel lightheaded with movement that resolves after being still. Patient evaluated with UA CBC and BMP. Patient noted to have UTI.  On physical exam patient noted to have a cerumen impaction of the left ear. Patient does not have an ataxic gait and cerebellar testing is within normal limits. Unlikely secondary to cerebellar insufficiency. Likely secondary to cerumen impaction.  Patient underwent irrigation of both ears secondary to decreased hearing loss bilaterally. Severe amount of cerumen was removed from the left ear. Patient states minimal improvement with hearing. Patient discharged home instructed that if he does not improve in the next 2-3 days to present to his primary care physician for further evaluation. Patient discharged with one week of Keflex.    Claudean Severance, MD 06/17/14 1715

## 2014-06-17 NOTE — ED Notes (Addendum)
Pt c/o fullness/pressure in both ears. sts he has felt a little off balance d/t it. Denies HA or increased weakness. Nad, skin warm and dry, resp e/u.

## 2014-06-19 NOTE — ED Provider Notes (Signed)
I saw and evaluated the patient, reviewed the resident's note and I agree with the findings and plan.  Pt c/o sense muffled hearing and dizziness. Cerumen impaction on exam. Afeb.    Mirna Mires, MD 06/19/14 [phone]

## 2016-02-03 LAB — LIPID PANEL
CHOLESTEROL: 294 mg/dL — AB (ref 0–200)
HDL: 21 mg/dL — AB (ref 35–70)

## 2016-02-03 LAB — BASIC METABOLIC PANEL
BUN: 28 mg/dL — AB (ref 4–21)
Creatinine: 1.5 mg/dL — AB (ref 0.6–1.3)
Glucose: 164 mg/dL
Potassium: 4.5 mmol/L (ref 3.4–5.3)
SODIUM: 139 mmol/L (ref 137–147)

## 2016-02-03 LAB — TSH: TSH: 2.91 u[IU]/mL (ref 0.41–5.90)

## 2016-02-03 LAB — MICROALBUMIN, URINE
MICROALB UR: NORMAL
Microalb, Ur: 150

## 2016-02-03 LAB — HEPATIC FUNCTION PANEL
ALT: 16 U/L (ref 10–40)
AST: 15 U/L (ref 14–40)
Bilirubin, Total: 0.4 mg/dL

## 2016-02-03 LAB — HEMOGLOBIN A1C
Hemoglobin A1C: 7.3
Hemoglobin A1C: 7.3

## 2016-09-07 ENCOUNTER — Ambulatory Visit: Payer: Self-pay | Admitting: Nurse Practitioner

## 2016-09-30 ENCOUNTER — Other Ambulatory Visit (INDEPENDENT_AMBULATORY_CARE_PROVIDER_SITE_OTHER): Payer: Commercial Managed Care - HMO

## 2016-09-30 ENCOUNTER — Encounter: Payer: Self-pay | Admitting: Nurse Practitioner

## 2016-09-30 ENCOUNTER — Ambulatory Visit (INDEPENDENT_AMBULATORY_CARE_PROVIDER_SITE_OTHER): Payer: Commercial Managed Care - HMO | Admitting: Nurse Practitioner

## 2016-09-30 VITALS — BP 138/94 | HR 93 | Temp 98.0°F | Ht 73.0 in | Wt 174.0 lb

## 2016-09-30 DIAGNOSIS — I10 Essential (primary) hypertension: Secondary | ICD-10-CM

## 2016-09-30 DIAGNOSIS — Z89511 Acquired absence of right leg below knee: Secondary | ICD-10-CM | POA: Insufficient documentation

## 2016-09-30 DIAGNOSIS — G546 Phantom limb syndrome with pain: Secondary | ICD-10-CM

## 2016-09-30 DIAGNOSIS — E785 Hyperlipidemia, unspecified: Secondary | ICD-10-CM

## 2016-09-30 DIAGNOSIS — G629 Polyneuropathy, unspecified: Secondary | ICD-10-CM | POA: Diagnosis not present

## 2016-09-30 DIAGNOSIS — R21 Rash and other nonspecific skin eruption: Secondary | ICD-10-CM

## 2016-09-30 DIAGNOSIS — I739 Peripheral vascular disease, unspecified: Secondary | ICD-10-CM | POA: Insufficient documentation

## 2016-09-30 LAB — COMPREHENSIVE METABOLIC PANEL
ALT: 17 U/L (ref 0–53)
AST: 15 U/L (ref 0–37)
Albumin: 4.4 g/dL (ref 3.5–5.2)
Alkaline Phosphatase: 62 U/L (ref 39–117)
BUN: 22 mg/dL (ref 6–23)
CO2: 25 meq/L (ref 19–32)
CREATININE: 1.21 mg/dL (ref 0.40–1.50)
Calcium: 9.9 mg/dL (ref 8.4–10.5)
Chloride: 101 mEq/L (ref 96–112)
GFR: 64.13 mL/min (ref >60.00)
GLUCOSE: 148 mg/dL — AB (ref 70–99)
Potassium: 4.3 mEq/L (ref 3.5–5.1)
SODIUM: 136 meq/L (ref 135–145)
Total Bilirubin: 0.5 mg/dL (ref 0.2–1.2)
Total Protein: 8.3 g/dL (ref 6.0–8.3)

## 2016-09-30 LAB — LIPID PANEL
CHOL/HDL RATIO: 11
Cholesterol: 330 mg/dL — ABNORMAL HIGH (ref 0–200)
HDL: 30.1 mg/dL — AB (ref >39.00)
Triglycerides: 1568 mg/dL — ABNORMAL HIGH (ref 0.0–149.0)

## 2016-09-30 LAB — LDL CHOLESTEROL, DIRECT: Direct LDL: 61 mg/dL

## 2016-09-30 MED ORDER — AMLODIPINE BESYLATE 5 MG PO TABS: 5.0000 mg | ORAL_TABLET | Freq: Every day | ORAL | 2 refills | Status: DC

## 2016-09-30 MED ORDER — HYDROCHLOROTHIAZIDE 25 MG PO TABS: 25.0000 mg | ORAL_TABLET | Freq: Every day | ORAL | 2 refills | Status: DC

## 2016-09-30 MED ORDER — DULOXETINE HCL 60 MG PO CPEP: 60.0000 mg | ORAL_CAPSULE | Freq: Two times a day (BID) | ORAL | 2 refills | Status: DC

## 2016-09-30 NOTE — Progress Notes (Signed)
Pre visit review using our clinic review tool, if applicable. No additional management support is needed unless otherwise documented below in the visit note. 

## 2016-09-30 NOTE — Assessment & Plan Note (Signed)
Current use of Cymbalta 60 mg twice a day. Referral to pain clinic entered. Consider use of Lyrica if increased dose of Cymbalta was ineffective.

## 2016-09-30 NOTE — Assessment & Plan Note (Signed)
Residual right sided weakness.

## 2016-09-30 NOTE — Progress Notes (Signed)
Subjective:    Patient ID: Jordan Arellano, male    DOB: [birth year], 65 y.o.   MRN: [id number]  Patient presents today for complete physical or establish care (new patient) and chronic right BKA pain (fanthom pain)  Rash  This is a chronic problem. The current episode started more than 1 year ago. The problem has been gradually worsening since onset. The affected locations include the face. The rash is characterized by redness. He was exposed to nothing. Associated symptoms include joint pain. Pertinent negatives include no congestion, cough, diarrhea, fever, shortness of breath or sore throat. Past treatments include nothing. There is no history of allergies or eczema.   Needs HCTZ and amlodipine refill.  Last seen by previous primary provider 4 months ago. Moved from [place] to [place] 4 months ago.  Chronic right BKA (phantom pain) and left lower extremity pain(numbness and tingling) x several years. Has been treated in the past with gabapentin, NSAIDs, and another agent he is on able to remember name. Recently prescribed Cymbalta 60 mg once a day (3 months ago), took medication for 1 month and stopped due to ineffective pain control. 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  Immunizations: (TDAP, Hep C screen, Pneumovax, Influenza, zoster)  Health Maintenance  Topic Date Due  . Tetanus Vaccine  07/16/1971  . Colon Cancer Screening  07/15/2002  . Shingles Vaccine  07/15/2012  . Flu Shot  09/05/2017*  .  Hepatitis C: One time screening is recommended by Center for Disease Control  (CDC) for  adults born from 50 through 1965.   09/05/2017*  . HIV Screening  09/05/2017*  *Topic was postponed. The date shown is not the original due date.   Diet:none Weight:  Wt Readings from Last 3 Encounters:  09/30/16 174 lb (78.9 kg)  03/28/14 165 lb 5.5 oz (75 kg)  03/25/14 170 lb (77.1 kg)   Exercise:none Fall Risk: Fall Risk  09/30/2016  Falls in the past year? No  Risk  for fall due to : Impaired balance/gait   Home Safety:home with niece, moved from [facility] to [facility] Depression/Suicide:no SI or HI No flowsheet data found. No flowsheet data found. Colonoscopy (every 5-40yrs, >50-39yrs):records needed PSA (yearly, >72yrs):records needed Advanced Directive: Advanced Directives 09/30/2016  Does patient have an advance directive? No  Would patient like information on creating an advanced directive? No - patient declined information  Pre-existing out of facility DNR order (yellow form or pink MOST form) -   Medications and allergies reviewed with patient and updated if appropriate.  Patient Active Problem List   Diagnosis Date Noted  . Hyperlipidemia 09/30/2016  . S/P unilateral BKA (below knee amputation), right ([place]) 09/30/2016  . Neuropathy ([place]) 09/30/2016  . Vascular disease, peripheral ([place]) 09/30/2016  . Phantom pain after amputation of lower extremity ([place]) 09/30/2016  . CVA (cerebral vascular accident) ([place]) 03/25/2014  . HTN (hypertension) 03/25/2014  . Tobacco abuse 03/25/2014    Current Outpatient Prescriptions on File Prior to Visit  Medication Sig Dispense Refill  . atorvastatin (LIPITOR) 20 MG tablet Take 1 tablet (20 mg total) by mouth daily at 6 PM. 30 tablet 0  . olmesartan (BENICAR) 40 MG tablet Take 1 tablet (40 mg total) by mouth daily. 30 tablet 1   No current facility-administered medications on file prior to visit.     Past Medical History:  Diagnosis Date  . Depression   . Hyperlipidemia   . Hypertension   . Stroke ([place])   .  TIA (transient ischemic attack)     Past Surgical History:  Procedure Laterality Date  . ambutation    . BELOW KNEE LEG AMPUTATION Right     Social History   Social History  . Marital status: Single    Spouse name: N/A  . Number of children: N/A  . Years of education: N/A   Social History Main Topics  . Smoking status: Current Some Day Smoker    Packs/day: 0.50    Years: 35.00    Types:  Cigarettes  . Smokeless tobacco: Never Used  . Alcohol use No  . Drug use: No  . Sexual activity: Not Asked   Other Topics Concern  . None   Social History Narrative  . None    Family History  Problem Relation Age of Onset  . Hypertension Mother   . Hypertension Father         Review of Systems  Constitutional: Negative for fever, malaise/fatigue and weight loss.  HENT: Negative for congestion and sore throat.   Eyes:       Negative for visual changes  Respiratory: Negative for cough and shortness of breath.   Cardiovascular: Negative for chest pain, palpitations and leg swelling.  Gastrointestinal: Negative for blood in stool, constipation, diarrhea and heartburn.  Genitourinary: Negative for dysuria, frequency and urgency.  Musculoskeletal: Positive for joint pain. Negative for falls and myalgias.  Skin: Negative for rash.  Neurological: Negative for dizziness, sensory change and headaches.  Endo/Heme/Allergies: Does not bruise/bleed easily.  Psychiatric/Behavioral: Positive for depression. Negative for substance abuse and suicidal ideas. The patient is not nervous/anxious.     Objective:   Vitals:   09/30/16 1515  BP: (!) 138/94  Pulse: 93  Temp: 98 F (36.7 C)    Body mass index is 22.96 kg/m.   Physical Examination:  Physical Exam  Constitutional: No distress.  HENT:  Mouth/Throat: No oropharyngeal exudate.  Eyes: No scleral icterus.  Neck: Normal range of motion. Neck supple.  Cardiovascular: Normal rate, regular rhythm and normal heart sounds.   Pulmonary/Chest: Effort normal and breath sounds normal.  Abdominal: Soft.  Musculoskeletal: He exhibits no edema.  Right BKA prosthesis. Diminished distal pulses on left lower extremity. Skin intact. No callus. Hypertrophic toenails. Ambulates with cane.  Lymphadenopathy:    He has no cervical adenopathy.  Skin: Skin is warm and dry. Rash noted.  Erythematous discrete papular lesions on his face.    Vitals reviewed.   ASSESSMENT and PLAN:  [name] was seen today for annual exam.  Diagnoses and all orders for this visit:  Essential hypertension -     Comprehensive metabolic panel; Future -     amLODipine (NORVASC) 5 MG tablet; Take 1 tablet (5 mg total) by mouth daily. -     hydrochlorothiazide (HYDRODIURIL) 25 MG tablet; Take 1 tablet (25 mg total) by mouth daily.  Hyperlipidemia, unspecified hyperlipidemia type -     Lipid panel; Future  Neuropathy ([place]) -     Ambulatory referral to Pain Clinic -     DULoxetine (CYMBALTA) 60 MG capsule; Take 1 capsule (60 mg total) by mouth 2 (two) times daily.  Facial rash -     Ambulatory referral to Dermatology  Phantom pain after amputation of lower extremity ([place]) -     Ambulatory referral to Pain Clinic    Neuropathy [facility]) Current use of Cymbalta 60 mg twice a day. Referral to pain clinic entered. Consider use of Lyrica if increased dose of Cymbalta was  ineffective.  CVA (cerebral vascular accident) ([place]) Residual right sided weakness.     Follow up: Return in about 1 month (around 10/31/2016) for neuropathy, HTN and hyperlipidemia.  Wilfred Lacy, NP

## 2016-09-30 NOTE — Patient Instructions (Addendum)
Sign medical release to get records from Dr. Tamala Julian with [facility] [place].  You will be called with appt for pain clinic and dermatology. Go to basement for lab draw.

## 2016-10-01 ENCOUNTER — Other Ambulatory Visit: Payer: Self-pay | Admitting: Nurse Practitioner

## 2016-10-01 DIAGNOSIS — E785 Hyperlipidemia, unspecified: Secondary | ICD-10-CM

## 2016-10-01 DIAGNOSIS — E781 Pure hyperglyceridemia: Secondary | ICD-10-CM | POA: Insufficient documentation

## 2016-10-01 MED ORDER — ATORVASTATIN CALCIUM 20 MG PO TABS: 20.0000 mg | ORAL_TABLET | Freq: Every day | ORAL | 3 refills | Status: DC

## 2016-10-07 ENCOUNTER — Telehealth: Payer: Self-pay | Admitting: Nurse Practitioner

## 2016-10-07 NOTE — Telephone Encounter (Signed)
Rec'd from [facility] forward 26 pages to Dr. Lorayne Marek

## 2016-10-11 ENCOUNTER — Telehealth: Payer: Self-pay | Admitting: Nurse Practitioner

## 2016-10-11 ENCOUNTER — Ambulatory Visit (INDEPENDENT_AMBULATORY_CARE_PROVIDER_SITE_OTHER): Payer: Commercial Managed Care - HMO | Admitting: Cardiology

## 2016-10-11 ENCOUNTER — Other Ambulatory Visit: Payer: Self-pay | Admitting: Nurse Practitioner

## 2016-10-11 ENCOUNTER — Encounter: Payer: Self-pay | Admitting: Cardiology

## 2016-10-11 VITALS — BP 126/82 | HR 90 | Ht 73.0 in | Wt 175.0 lb

## 2016-10-11 DIAGNOSIS — I10 Essential (primary) hypertension: Secondary | ICD-10-CM

## 2016-10-11 DIAGNOSIS — Z720 Tobacco use: Secondary | ICD-10-CM | POA: Diagnosis not present

## 2016-10-11 DIAGNOSIS — Z79899 Other long term (current) drug therapy: Secondary | ICD-10-CM

## 2016-10-11 DIAGNOSIS — E781 Pure hyperglyceridemia: Secondary | ICD-10-CM | POA: Diagnosis not present

## 2016-10-11 DIAGNOSIS — I739 Peripheral vascular disease, unspecified: Secondary | ICD-10-CM

## 2016-10-11 DIAGNOSIS — I63132 Cerebral infarction due to embolism of left carotid artery: Secondary | ICD-10-CM | POA: Diagnosis not present

## 2016-10-11 MED ORDER — OMEGA-3-ACID ETHYL ESTERS 1 G PO CAPS: 2.0000 g | ORAL_CAPSULE | Freq: Two times a day (BID) | ORAL | 3 refills | Status: DC

## 2016-10-11 MED ORDER — OLMESARTAN MEDOXOMIL 40 MG PO TABS: 40.0000 mg | ORAL_TABLET | Freq: Every day | ORAL | 1 refills | Status: DC

## 2016-10-11 NOTE — Telephone Encounter (Signed)
Pt request refill for olmesartan (BENICAR) 40 MG tablet send to [facility]. Please help, he is out of this med.

## 2016-10-11 NOTE — Patient Instructions (Signed)
Start  Lovaza 2 gm ( 2 tablets/capsule) by mouth twice a day This in addition to what you take now.   No other changes   Need labs in JAN 2018 - FASTING - NOTHING TO EAT OR DRINK THE MORNING OF THE TEST - WILL MAIL YOU LAB SLIP  LATER Jordan Arellano 2017.  Your physician recommends that you schedule a follow-up appointment in: JAN 2018 WITH DR [place].  If you need a refill on your cardiac medications before your next appointment, please call your pharmacy.

## 2016-10-11 NOTE — Assessment & Plan Note (Signed)
Minimal residual right-sided symptoms. On aspirin and statin as well as blood pressure control. Need smoking cessation.

## 2016-10-11 NOTE — Assessment & Plan Note (Signed)
Well-controlled on HCTZ/losartan.

## 2016-10-11 NOTE — Progress Notes (Signed)
PCP: Wilfred Lacy, NP  Clinic Note: Chief Complaint  Patient presents with  . New Patient (Initial Visit)    PAD, Hypertriglyceridemia    HPI: Jordan Arellano is a 65 y.o. male with a PMH below who presents today for Cardiology consultation for hyperlipidemia - notably hypertriglyceridemia & PAD.  He has history of probably PAD with right BKA as well as left lower extremity pain. (BKA > 10 yr ago @ [facility]) - due to foot infection (Drs. Amedeo Plenty & Early) He has history of hypertension, hyperlipidemia tobacco abuse as well as his prior stroke.  Just moved back to [place] (had been in [facility] fpr < 1 yr))  Jordan Arellano was last seen on October 26 by Wilfred Lacy, NP  Recent Hospitalizations: None  Studies Reviewed:   Echo 01/2014 (for CVA) : EF 55-60%. GR 1 DD. No significant valvular lesions  Carotid Doppplers 03/2014: Mild Bilateral prox ICA disease.  Patent R Vert A. Occluded/not insonated L Vert A. (seen as occluded on MRI-A brain)  Interval History: [name] today for evaluation for hypertriglyceridemia management. He has a history of PAD status post right BKA years ago for a nonhealing wound. He has not had any claudication symptoms in his left leg, but has not necessarily had follow-up studies either. He has been on accommodation of statin plus fenofibrate for his mixed dyslipidemia. Last recorded lipids showed triglycerides of 600, but the most recent labs are now showing triglycerides of 1560 -- he indicates that he had been off the fenofibrate for a month or so, thinking that he may been causing a rash. But he is not been off for a long period of time. He doesn't a steroid watch his diet very well, and continues to smoke. Aspirin he walks using a prosthetic right leg, and denies any significant claudication. No resting or exertional chest tightness/pressure. No dyspnea on exertion. No PND, orthopnea or edema. No recurrent wounds or lesions on the left foot or leg. He had a  history of a stroke back in 2015, but does not have any residual symptoms. The main thing he noted was some arm numbness on the right side. No syncope/near syncope symptoms. No rapid irregular heartbeats/palpitations.  No claudication - no L leg Sx.  With hypertriglyceridemia, notably he denies any abdominal pain or nausea/vomiting.   ROS: A comprehensive was performed. Review of Systems  Constitutional: Negative for chills, fever, malaise/fatigue and weight loss.  HENT: Negative for congestion, hearing loss and nosebleeds.   Eyes: Negative.   Respiratory: Positive for cough (Morning cough). Negative for shortness of breath and wheezing.   Cardiovascular: Negative.        Per history of present illness  Gastrointestinal: Positive for heartburn. Negative for abdominal pain, blood in stool, constipation, melena, nausea and vomiting.  Musculoskeletal: Negative.  Negative for back pain.       Phantom pain along the right leg  Skin: Positive for rash (Discussed with PCP. -Thought it may have been related to fenofibrate, but did not resolve after stopping).  Neurological: Negative for dizziness.  Psychiatric/Behavioral: Negative for depression and memory loss. The patient is not nervous/anxious and does not have insomnia.      Past Medical History:  Diagnosis Date  . Depression   . Hyperlipidemia   . Hypertension   . PAD (peripheral artery disease) ([place]) ~2007   s/p R BKA for non-healing wound  . Stroke ([place]) 03/2014   MRI: Acute nonhemorrhagic left paracentral pontine infarct. 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  [place] TIA (transient ischemic attack) 01/2014    Past Surgical History:  Procedure Laterality Date  . BELOW KNEE LEG AMPUTATION Right   . TRANSTHORACIC ECHOCARDIOGRAM  01/2014   To evaluate possible CVA: EF 55-60%. GR 1 DD. No significant valvular lesions   Prior to Admission medications     Medication Sig Start Date End Date Taking? Authorizing Provider  aspirin 81 MG tablet Take 81 mg by mouth daily.   Yes Historical Provider, MD  atorvastatin (LIPITOR) 20 MG tablet Take 1 tablet (20 mg total) by mouth daily at 6 PM. 10/01/16  Yes Flossie Buffy, NP  buPROPion [facility] SR) 150 MG 12 hr tablet  09/15/16  Yes Historical Provider, MD  DULoxetine (CYMBALTA) 60 MG capsule Take 1 capsule (60 mg total) by mouth 2 (two) times daily. 09/30/16  Yes Flossie Buffy, NP  fenofibrate (TRICOR) 145 MG tablet  09/15/16  Yes Historical Provider, MD  hydrochlorothiazide (HYDRODIURIL) 25 MG tablet Take 1 tablet (25 mg total) by mouth daily. 09/30/16  Yes Charlene Brooke Nche, NP  olmesartan (BENICAR) 40 MG tablet Take 1 tablet (40 mg total) by mouth daily. 04/08/14  Yes Daniel J Angiulli, PA-C  sertraline (ZOLOFT) 100 MG tablet  09/06/16  Yes Historical Provider, MD   No Known Allergies   Social History   Social History  . Marital status: Single    Spouse name: N/A  . Number of children: N/A  . Years of education: N/A   Social History Main Topics  . Smoking status: Current Some Day Smoker    Packs/day: 0.50    Years: 35.00    Types: Cigarettes  . Smokeless tobacco: Never Used  . Alcohol use No  . Drug use: No  . Sexual activity: Not Asked   Other Topics Concern  . None   Social History Narrative  . None   Family History  Problem Relation Age of Onset  . Hypertension Mother     Does not know history  . Hypertension Father     Wt Readings from Last 3 Encounters:  10/11/16 79.4 kg (175 lb)  09/30/16 78.9 kg (174 lb)  03/28/14 75 kg (165 lb 5.5 oz)    PHYSICAL EXAM BP 126/82   Pulse 90   Ht 6\' 1"  (1.854 m)   Wt 79.4 kg (175 lb)   BMI 23.09 kg/m  General appearance: alert, cooperative, appears stated age, no distress; Well-nourished, well-groomed. HEENT: [place]/AT, EOMI, MMM, anicteric sclera Neck: no adenopathy, no carotid bruit and no JVD Lungs: clear to  auscultation bilaterally, normal percussion bilaterally and non-labored Heart: regular rate and rhythm, S1& S2 normal, no murmur, click, rub or gallop; nondisplaced PMI Abdomen: soft, non-tender; bowel sounds normal; no masses,  no organomegaly; no HJR Extremities: extremities normal, atraumatic, no cyanosis, or edema ; R BKA with prosthesis in place. Pulses: 2+ and symmetric radial - mildly diminished L DP/PT; R femoral bruit. Skin: mobility and turgor normal, no lesions noted and mild erythematous macular rash on B arms  Neurologic: Mental status: Alert, oriented, thought content appropriate Cranial nerves: normal (II-XII grossly intact)   Adult ECG Report  Rate: 90 ;  Rhythm: normal sinus rhythm and LAFB (-68), cannot exclude inferior MI, age undetermined. Consider pulmonary disease pattern.;   Narrative Interpretation: Relatively stable compared to last EKG in 2015. Axis is slightly more leftward (was previously -32.   Other studies Reviewed: Additional  studies/ records that were reviewed today include:  Recent Labs:   Lab Results  Component Value Date   CHOL 330 (H) 09/30/2016   HDL 30.10 (L) 09/30/2016   LDLCALC UNABLE TO CALCULATE IF TRIGLYCERIDE OVER 400 mg/dL 03/27/2014   LDLDIRECT 61.0 09/30/2016   TRIG (H) 09/30/2016    1568.0 Triglyceride is over 400; calculations on Lipids are invalid.   CHOLHDL 11 09/30/2016    ASSESSMENT / PLAN: Problem List Items Addressed This Visit    Essential hypertension (Chronic)    Well-controlled on HCTZ/losartan.      Relevant Medications   omega-3 acid ethyl esters (LOVAZA) 1 g capsule   Other Relevant Orders   EKG 12-Lead   Lipid panel   Comprehensive metabolic panel   Hypertriglyceridemia - Primary (Chronic)    Triglycerides are extremely elevated. He has been on statin for long period time, and levels are not quite at the pancreatitis range. He is are men on statin, will continue statin along with fenofibrate. Will add Lovaza 2 g  twice a day for severe hypercholesterolemia. Recheck labs in 3 months. Depending triglycerides low, may need to increase statin, or switch to rosuvastatin. Closely monitor for myalgias. Could consider niacin, however with his rash issues, I want to make sure he is on a medicine that he will tolerate.      Relevant Medications   omega-3 acid ethyl esters (LOVAZA) 1 g capsule   Other Relevant Orders   EKG 12-Lead   Lipid panel   Comprehensive metabolic panel   CVA (cerebral vascular accident) ([place]) (Chronic)    Minimal residual right-sided symptoms. On aspirin and statin as well as blood pressure control. Need smoking cessation.      Relevant Medications   omega-3 acid ethyl esters (LOVAZA) 1 g capsule   Tobacco abuse (Chronic)    Smoking cessation instruction/counseling given:  counseled patient on the dangers of tobacco use, advised patient to stop smoking, and reviewed strategies to maximize success - recommended Nicoderm Patches - start @ 21 mg x ~1 week, then reduce to 14mg  x ~2-3 weeks followed by 7 mg.  Call [place] Quit-Line for advice.  Low threshold for Chantix - but wait until TG under control. 5 min discussion.      Relevant Orders   EKG 12-Lead   Lipid panel   Comprehensive metabolic panel   Vascular disease, peripheral (HCC) (Chronic)    He is on adequate blood pressure control, statin and aspirin. Working on triglycerides.  Once we get his lipids under control, we can then performed surveillance Dopplers of the lower extremities and carotids.        Relevant Medications   omega-3 acid ethyl esters (LOVAZA) 1 g capsule   Other Relevant Orders   EKG 12-Lead   Lipid panel   Comprehensive metabolic panel    Other Visit Diagnoses    Medication management       Relevant Orders   Lipid panel   Comprehensive metabolic panel      Current medicines are reviewed at length with the patient today. (+/- concerns) none The following changes have been made: needs  refills   Patient Instructions  Start  Lovaza 2 gm ( 2 tablets/capsule) by mouth twice a day This in addition to what you take now.   No other changes   Need labs in JAN 2018 - FASTING - NOTHING TO EAT OR DRINK THE MORNING OF THE TEST - WILL MAIL YOU LAB SLIP  LATER [place] 2017.  Your physician recommends that you schedule a follow-up appointment in: JAN 2018 WITH DR Conway.  If you need a refill on your cardiac medications before your next appointment, please call your pharmacy.   Studies Ordered:   Orders Placed This Encounter  Procedures  . Lipid panel  . Comprehensive metabolic panel  . EKG 12-Lead      Glenetta Hew, M.D., M.S. Interventional Cardiologist   Pager # [phone] Phone # [phone] [address] [place], [place] [address]

## 2016-10-11 NOTE — Assessment & Plan Note (Addendum)
Smoking cessation instruction/counseling given:  counseled patient on the dangers of tobacco use, advised patient to stop smoking, and reviewed strategies to maximize success - recommended Nicoderm Patches - start @ 21 mg x ~1 week, then reduce to 14mg  x ~2-3 weeks followed by 7 mg.  Call [place] Quit-Line for advice.  Low threshold for Chantix - but wait until TG under control. 5 min discussion.

## 2016-10-11 NOTE — Assessment & Plan Note (Signed)
>>  ASSESSMENT AND PLAN FOR HYPERTRIGLYCERIDEMIA WRITTEN ON 10/11/2016 10:58 PM BY HARDING, ALM ORN, MD  Triglycerides are extremely elevated. He has been on statin for long period time, and levels are not quite at the pancreatitis range. He is are men on statin, will continue statin along with fenofibrate . Will add Lovaza  2 g twice a day for severe hypercholesterolemia. Recheck labs in 3 months. Depending triglycerides low, may need to increase statin, or switch to rosuvastatin . Closely monitor for myalgias. Could consider niacin, however with his rash issues, I want to make sure he is on a medicine that he will tolerate.

## 2016-10-11 NOTE — Assessment & Plan Note (Signed)
He is on adequate blood pressure control, statin and aspirin. Working on triglycerides.  Once we get his lipids under control, we can then performed surveillance Dopplers of the lower extremities and carotids.

## 2016-10-11 NOTE — Assessment & Plan Note (Signed)
Triglycerides are extremely elevated. He has been on statin for long period time, and levels are not quite at the pancreatitis range. He is are men on statin, will continue statin along with fenofibrate. Will add Lovaza 2 g twice a day for severe hypercholesterolemia. Recheck labs in 3 months. Depending triglycerides low, may need to increase statin, or switch to rosuvastatin. Closely monitor for myalgias. Could consider niacin, however with his rash issues, I want to make sure he is on a medicine that he will tolerate.

## 2016-10-25 ENCOUNTER — Encounter: Payer: Self-pay | Admitting: Nurse Practitioner

## 2016-11-03 ENCOUNTER — Encounter: Payer: Self-pay | Admitting: Nurse Practitioner

## 2016-11-03 ENCOUNTER — Ambulatory Visit (INDEPENDENT_AMBULATORY_CARE_PROVIDER_SITE_OTHER): Payer: Commercial Managed Care - HMO | Admitting: Nurse Practitioner

## 2016-11-03 VITALS — BP 120/64 | HR 98 | Temp 98.1°F | Ht 73.0 in | Wt 174.0 lb

## 2016-11-03 DIAGNOSIS — G629 Polyneuropathy, unspecified: Secondary | ICD-10-CM

## 2016-11-03 DIAGNOSIS — E781 Pure hyperglyceridemia: Secondary | ICD-10-CM | POA: Diagnosis not present

## 2016-11-03 DIAGNOSIS — I63132 Cerebral infarction due to embolism of left carotid artery: Secondary | ICD-10-CM

## 2016-11-03 DIAGNOSIS — I10 Essential (primary) hypertension: Secondary | ICD-10-CM

## 2016-11-03 DIAGNOSIS — E782 Mixed hyperlipidemia: Secondary | ICD-10-CM

## 2016-11-03 DIAGNOSIS — G546 Phantom limb syndrome with pain: Secondary | ICD-10-CM

## 2016-11-03 DIAGNOSIS — Z720 Tobacco use: Secondary | ICD-10-CM

## 2016-11-03 DIAGNOSIS — I739 Peripheral vascular disease, unspecified: Secondary | ICD-10-CM

## 2016-11-03 DIAGNOSIS — Z89511 Acquired absence of right leg below knee: Secondary | ICD-10-CM

## 2016-11-03 MED ORDER — UNABLE TO FIND: 1.0000 [IU] | Freq: Every day | 0 refills | Status: DC

## 2016-11-03 NOTE — Assessment & Plan Note (Signed)
>>  ASSESSMENT AND PLAN FOR HYPERTRIGLYCERIDEMIA WRITTEN ON 11/03/2016  5:28 PM BY NCHE, CHARLOTTE LUM, NP  F/up with cardiologist 12/2015. Pending repeat lipid panel and CMP prior to next OV

## 2016-11-03 NOTE — Assessment & Plan Note (Signed)
F/up with cardiologist 12/2015. Pending repeat lipid panel and CMP prior to next OV

## 2016-11-03 NOTE — Progress Notes (Signed)
Pre visit review using our clinic review tool, if applicable. No additional management support is needed unless otherwise documented below in the visit note. 

## 2016-11-03 NOTE — Assessment & Plan Note (Signed)
Controlled with losartan and HCTZ. Repeat CMP in 6months

## 2016-11-03 NOTE — Progress Notes (Signed)
Subjective:  Patient ID: Jordan Arellano, male    DOB: 1950/12/22  Age: 65 y.o. MRN: [id number]  CC: Follow-up (follow up on BP)   HPI HTN: Denies any chest pain or palpitations or edema or dizziness or headache.  Hypertriglyceridemia: Seen by cardiologist 10/11/16. lovaza was added to fenofibrate and lipitor. He denies and myalgia or muscle weakness.  Neuropathy: Controlled with increased dose of cymbalta. Denies any adverse effects.  He is requesting an electric wheelchair due to difficulty in ambulating of prothesis and cane. Prothesis has been refitted several times, but no improvement. His ADLs are limited and feels confined to his house due to difficulty with ambulation. due to Due to weak upper body strength, he will like to rather have an electric vs manual wheelchair.  Outpatient Medications Prior to Visit  Medication Sig Dispense Refill  . aspirin 81 MG tablet Take 81 mg by mouth daily.    Marland Kitchen atorvastatin (LIPITOR) 20 MG tablet Take 1 tablet (20 mg total) by mouth daily at 6 PM. 30 tablet 3  . buPROPion (WELLBUTRIN SR) 150 MG 12 hr tablet     . DULoxetine (CYMBALTA) 60 MG capsule Take 1 capsule (60 mg total) by mouth 2 (two) times daily. 60 capsule 2  . fenofibrate (TRICOR) 145 MG tablet     . hydrochlorothiazide (HYDRODIURIL) 25 MG tablet Take 1 tablet (25 mg total) by mouth daily. 30 tablet 2  . olmesartan (BENICAR) 40 MG tablet Take 1 tablet (40 mg total) by mouth daily. 90 tablet 1  . omega-3 acid ethyl esters (LOVAZA) 1 g capsule Take 2 capsules (2 g total) by mouth 2 (two) times daily. 360 capsule 3  . sertraline (ZOLOFT) 100 MG tablet      No facility-administered medications prior to visit.     ROS See HPI  Objective:  BP 120/64   Pulse 98   Temp 98.1 F (36.7 C)   Ht 6\' 1"  (1.854 m)   Wt 174 lb (78.9 kg)   SpO2 98%   BMI 22.96 kg/m   BP Readings from Last 3 Encounters:  11/03/16 120/64  10/11/16 126/82  09/30/16 (!) 138/94    Wt Readings from  Last 3 Encounters:  11/03/16 174 lb (78.9 kg)  10/11/16 175 lb (79.4 kg)  09/30/16 174 lb (78.9 kg)    Physical Exam  Constitutional: He is oriented to person, place, and time. No distress.  Neck: Normal range of motion. Neck supple.  Cardiovascular: Normal rate, regular rhythm and normal heart sounds.   Pulmonary/Chest: Effort normal and breath sounds normal.  Musculoskeletal: He exhibits no edema.  Neurological: He is alert and oriented to person, place, and time.  Limping gait with use of cane.  Skin: Skin is warm and dry.  Vitals reviewed.   Lab Results  Component Value Date   WBC 7.9 06/17/2014   HGB 14.7 06/17/2014   HCT 43.1 06/17/2014   PLT 228 06/17/2014   GLUCOSE 148 (H) 09/30/2016   CHOL 330 (H) 09/30/2016   TRIG (H) 09/30/2016    1568.0 Triglyceride is over 400; calculations on Lipids are invalid.   HDL 30.10 (L) 09/30/2016   LDLDIRECT 61.0 09/30/2016   LDLCALC UNABLE TO CALCULATE IF TRIGLYCERIDE OVER 400 mg/dL 03/27/2014   ALT 17 09/30/2016   AST 15 09/30/2016   NA 136 09/30/2016   K 4.3 09/30/2016   CL 101 09/30/2016   CREATININE 1.21 09/30/2016   BUN 22 09/30/2016   CO2 25 09/30/2016  INR 1.01 03/25/2014   HGBA1C 6.2 (H) 03/26/2014    No results found.  Assessment & Plan:   Jordan Arellano was seen today for follow-up.  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 Med Name: Clinical research associate Need due to hx of CVA with left side weakness, left BKA, weak upper body strength.  S/P unilateral BKA (below knee amputation), right (HCC) -     UNABLE TO FIND; 1 Units by Other route daily. Med Name: Clinical research associate Need due to hx of CVA with left side weakness, left BKA, weak upper body strength.  Neuropathy (HCC) -     UNABLE TO FIND; 1 Units by  Other route daily. Med Name: Clinical research associate Need due to hx of CVA with left side weakness, left BKA, weak upper body strength.  Phantom pain after amputation of lower extremity (HCC) -     UNABLE TO FIND; 1 Units by Other route daily. Med Name: Clinical research associate Need due to hx of CVA with left side weakness, left BKA, weak upper body strength.  Tobacco abuse  Essential hypertension   I am having [name] start on UNABLE TO FIND. I am also having him maintain his fenofibrate, buPROPion, sertraline, aspirin, DULoxetine, hydrochlorothiazide, atorvastatin, omega-3 acid ethyl esters, and olmesartan.  Meds ordered this encounter  Medications  . UNABLE TO FIND    Sig: 1 Units by Other route daily. Med Name: Clinical research associate Need due to hx of CVA with left side weakness, left BKA, weak upper body strength.    Dispense:  1 Units    Refill:  0    Order Specific Question:   Supervising Provider    Answer:   Cassandria Anger [[id number]]    Follow-up: Return in about 6 months (around 05/03/2017) for hyperlipidemia, HTN, neuropathy.  Wilfred Lacy, NP

## 2016-11-03 NOTE — Assessment & Plan Note (Signed)
Controlled with cymbalta 

## 2016-11-03 NOTE — Patient Instructions (Signed)
Follow up with cardiologist as scheduled. Continue medications as prescribed

## 2016-11-03 NOTE — Assessment & Plan Note (Signed)
He expresses interest in use of nicotine patch to quit.  He will call insurance company to inquire about coverage.

## 2016-12-12 ENCOUNTER — Encounter (HOSPITAL_COMMUNITY): Payer: Self-pay | Admitting: Emergency Medicine

## 2016-12-12 ENCOUNTER — Emergency Department (HOSPITAL_COMMUNITY): Payer: Medicare HMO

## 2016-12-12 ENCOUNTER — Inpatient Hospital Stay (HOSPITAL_COMMUNITY): Payer: Medicare HMO

## 2016-12-12 ENCOUNTER — Inpatient Hospital Stay (HOSPITAL_COMMUNITY)
Admission: EM | Admit: 2016-12-12 | Discharge: 2016-12-19 | DRG: 253 | Disposition: A | Discharge status: Home / Self Care | Payer: Medicare HMO | Attending: Internal Medicine | Admitting: Internal Medicine

## 2016-12-12 DIAGNOSIS — F329 Major depressive disorder, single episode, unspecified: Secondary | ICD-10-CM | POA: Diagnosis present

## 2016-12-12 DIAGNOSIS — Z01810 Encounter for preprocedural cardiovascular examination: Secondary | ICD-10-CM | POA: Diagnosis not present

## 2016-12-12 DIAGNOSIS — G629 Polyneuropathy, unspecified: Secondary | ICD-10-CM

## 2016-12-12 DIAGNOSIS — T814XXA Infection following a procedure, initial encounter: Secondary | ICD-10-CM | POA: Diagnosis not present

## 2016-12-12 DIAGNOSIS — Z833 Family history of diabetes mellitus: Secondary | ICD-10-CM

## 2016-12-12 DIAGNOSIS — L03032 Cellulitis of left toe: Secondary | ICD-10-CM | POA: Diagnosis present

## 2016-12-12 DIAGNOSIS — I739 Peripheral vascular disease, unspecified: Principal | ICD-10-CM | POA: Diagnosis present

## 2016-12-12 DIAGNOSIS — L97509 Non-pressure chronic ulcer of other part of unspecified foot with unspecified severity: Secondary | ICD-10-CM

## 2016-12-12 DIAGNOSIS — Z89611 Acquired absence of right leg above knee: Secondary | ICD-10-CM | POA: Diagnosis not present

## 2016-12-12 DIAGNOSIS — Z89511 Acquired absence of right leg below knee: Secondary | ICD-10-CM

## 2016-12-12 DIAGNOSIS — Z7982 Long term (current) use of aspirin: Secondary | ICD-10-CM | POA: Diagnosis not present

## 2016-12-12 DIAGNOSIS — N179 Acute kidney failure, unspecified: Secondary | ICD-10-CM | POA: Diagnosis not present

## 2016-12-12 DIAGNOSIS — Z79899 Other long term (current) drug therapy: Secondary | ICD-10-CM

## 2016-12-12 DIAGNOSIS — E1151 Type 2 diabetes mellitus with diabetic peripheral angiopathy without gangrene: Secondary | ICD-10-CM | POA: Diagnosis present

## 2016-12-12 DIAGNOSIS — E785 Hyperlipidemia, unspecified: Secondary | ICD-10-CM | POA: Diagnosis present

## 2016-12-12 DIAGNOSIS — Z720 Tobacco use: Secondary | ICD-10-CM | POA: Diagnosis present

## 2016-12-12 DIAGNOSIS — Z8249 Family history of ischemic heart disease and other diseases of the circulatory system: Secondary | ICD-10-CM | POA: Diagnosis not present

## 2016-12-12 DIAGNOSIS — I70245 Atherosclerosis of native arteries of left leg with ulceration of other part of foot: Secondary | ICD-10-CM | POA: Diagnosis not present

## 2016-12-12 DIAGNOSIS — F17210 Nicotine dependence, cigarettes, uncomplicated: Secondary | ICD-10-CM | POA: Diagnosis present

## 2016-12-12 DIAGNOSIS — Z823 Family history of stroke: Secondary | ICD-10-CM | POA: Diagnosis not present

## 2016-12-12 DIAGNOSIS — L97521 Non-pressure chronic ulcer of other part of left foot limited to breakdown of skin: Secondary | ICD-10-CM | POA: Diagnosis not present

## 2016-12-12 DIAGNOSIS — I998 Other disorder of circulatory system: Secondary | ICD-10-CM | POA: Diagnosis present

## 2016-12-12 DIAGNOSIS — Z95828 Presence of other vascular implants and grafts: Secondary | ICD-10-CM

## 2016-12-12 DIAGNOSIS — L97529 Non-pressure chronic ulcer of other part of left foot with unspecified severity: Secondary | ICD-10-CM | POA: Diagnosis not present

## 2016-12-12 DIAGNOSIS — T148XXA Other injury of unspecified body region, initial encounter: Secondary | ICD-10-CM | POA: Diagnosis present

## 2016-12-12 DIAGNOSIS — Z8673 Personal history of transient ischemic attack (TIA), and cerebral infarction without residual deficits: Secondary | ICD-10-CM

## 2016-12-12 DIAGNOSIS — E782 Mixed hyperlipidemia: Secondary | ICD-10-CM | POA: Diagnosis not present

## 2016-12-12 DIAGNOSIS — I10 Essential (primary) hypertension: Secondary | ICD-10-CM | POA: Diagnosis not present

## 2016-12-12 DIAGNOSIS — I771 Stricture of artery: Secondary | ICD-10-CM | POA: Diagnosis present

## 2016-12-12 DIAGNOSIS — L97501 Non-pressure chronic ulcer of other part of unspecified foot limited to breakdown of skin: Secondary | ICD-10-CM | POA: Diagnosis not present

## 2016-12-12 DIAGNOSIS — E11621 Type 2 diabetes mellitus with foot ulcer: Secondary | ICD-10-CM | POA: Diagnosis present

## 2016-12-12 DIAGNOSIS — L089 Local infection of the skin and subcutaneous tissue, unspecified: Secondary | ICD-10-CM | POA: Insufficient documentation

## 2016-12-12 HISTORY — DX: Non-pressure chronic ulcer of other part of unspecified foot with unspecified severity: L97.509

## 2016-12-12 LAB — COMPREHENSIVE METABOLIC PANEL
ALT: 19 U/L (ref 17–63)
ANION GAP: 13 (ref 5–15)
AST: 23 U/L (ref 15–41)
Albumin: 3.7 g/dL (ref 3.5–5.0)
Alkaline Phosphatase: 48 U/L (ref 38–126)
BILIRUBIN TOTAL: 0.7 mg/dL (ref 0.3–1.2)
BUN: 35 mg/dL — ABNORMAL HIGH (ref 6–20)
CO2: 18 mmol/L — ABNORMAL LOW (ref 22–32)
Calcium: 9.7 mg/dL (ref 8.9–10.3)
Chloride: 106 mmol/L (ref 101–111)
Creatinine, Ser: 2.05 mg/dL — ABNORMAL HIGH (ref 0.61–1.24)
GFR, EST AFRICAN AMERICAN: 38 mL/min — AB (ref >60)
GFR, EST NON AFRICAN AMERICAN: 33 mL/min — AB (ref >60)
Glucose, Bld: 255 mg/dL — ABNORMAL HIGH (ref 65–99)
POTASSIUM: 4 mmol/L (ref 3.5–5.1)
Sodium: 137 mmol/L (ref 135–145)
TOTAL PROTEIN: 7.6 g/dL (ref 6.5–8.1)

## 2016-12-12 LAB — CBC WITH DIFFERENTIAL/PLATELET
Basophils Absolute: 0.1 10*3/uL (ref 0.0–0.1)
Basophils Relative: 1 %
Eosinophils Absolute: 0.2 10*3/uL (ref 0.0–0.7)
Eosinophils Relative: 2 %
HEMATOCRIT: 45.1 % (ref 39.0–52.0)
HEMOGLOBIN: 15.6 g/dL (ref 13.0–17.0)
LYMPHS ABS: 2.9 10*3/uL (ref 0.7–4.0)
LYMPHS PCT: 26 %
MCH: 31.4 pg (ref 26.0–34.0)
MCHC: 34.6 g/dL (ref 30.0–36.0)
MCV: 90.7 fL (ref 78.0–100.0)
Monocytes Absolute: 0.6 10*3/uL (ref 0.1–1.0)
Monocytes Relative: 5 %
NEUTROS ABS: 7.5 10*3/uL (ref 1.7–7.7)
NEUTROS PCT: 66 %
Platelets: 295 10*3/uL (ref 150–400)
RBC: 4.97 MIL/uL (ref 4.22–5.81)
RDW: 14.5 % (ref 11.5–15.5)
WBC: 11.3 10*3/uL — AB (ref 4.0–10.5)

## 2016-12-12 LAB — C-REACTIVE PROTEIN: CRP: 14.7 mg/dL — ABNORMAL HIGH (ref <1.0)

## 2016-12-12 LAB — SEDIMENTATION RATE: SED RATE: 60 mm/h — AB (ref 0–16)

## 2016-12-12 MED ORDER — SERTRALINE HCL 100 MG PO TABS
100.0000 mg | ORAL_TABLET | Freq: Every day | ORAL | Status: DC
  Administered 2016-12-12 – 2016-12-19 (×8): 100 mg via ORAL
  Filled 2016-12-12 (×8): qty 1

## 2016-12-12 MED ORDER — VANCOMYCIN HCL IN DEXTROSE 1-5 GM/200ML-% IV SOLN
1000.0000 mg | Freq: Once | INTRAVENOUS | Status: AC
  Administered 2016-12-12: 1000 mg via INTRAVENOUS
  Filled 2016-12-12: qty 200

## 2016-12-12 MED ORDER — BUPROPION HCL ER (SR) 150 MG PO TB12
150.0000 mg | ORAL_TABLET | Freq: Every day | ORAL | Status: DC
  Administered 2016-12-12 – 2016-12-19 (×8): 150 mg via ORAL
  Filled 2016-12-12 (×8): qty 1

## 2016-12-12 MED ORDER — ONDANSETRON HCL 4 MG PO TABS: 4.0000 mg | ORAL_TABLET | Freq: Four times a day (QID) | ORAL | Status: DC | PRN

## 2016-12-12 MED ORDER — DULOXETINE HCL 60 MG PO CPEP
60.0000 mg | ORAL_CAPSULE | Freq: Two times a day (BID) | ORAL | Status: DC
  Administered 2016-12-12 – 2016-12-19 (×14): 60 mg via ORAL
  Filled 2016-12-12 (×14): qty 1

## 2016-12-12 MED ORDER — GABAPENTIN 600 MG PO TABS
600.0000 mg | ORAL_TABLET | Freq: Every day | ORAL | Status: DC
  Administered 2016-12-12 – 2016-12-18 (×7): 600 mg via ORAL
  Filled 2016-12-12 (×7): qty 1

## 2016-12-12 MED ORDER — OMEGA-3-ACID ETHYL ESTERS 1 G PO CAPS
2.0000 g | ORAL_CAPSULE | Freq: Two times a day (BID) | ORAL | Status: DC
  Administered 2016-12-12 – 2016-12-19 (×14): 2 g via ORAL
  Filled 2016-12-12 (×14): qty 2

## 2016-12-12 MED ORDER — NICOTINE 21 MG/24HR TD PT24
21.0000 mg | MEDICATED_PATCH | Freq: Every day | TRANSDERMAL | Status: DC
  Administered 2016-12-12 – 2016-12-19 (×8): 21 mg via TRANSDERMAL
  Filled 2016-12-12 (×8): qty 1

## 2016-12-12 MED ORDER — HYDROCODONE-ACETAMINOPHEN 5-325 MG PO TABS
1.0000 | ORAL_TABLET | ORAL | Status: DC | PRN
  Administered 2016-12-14 – 2016-12-16 (×6): 2 via ORAL
  Administered 2016-12-17 (×2): 1 via ORAL
  Administered 2016-12-18: 2 via ORAL
  Administered 2016-12-18: 1 via ORAL
  Administered 2016-12-18 – 2016-12-19 (×3): 2 via ORAL
  Filled 2016-12-12 (×4): qty 2
  Filled 2016-12-12 (×2): qty 1
  Filled 2016-12-12 (×2): qty 2
  Filled 2016-12-12: qty 1
  Filled 2016-12-12 (×5): qty 2

## 2016-12-12 MED ORDER — SODIUM CHLORIDE 0.9 % IV BOLUS (SEPSIS)
1000.0000 mL | Freq: Once | INTRAVENOUS | Status: AC
  Administered 2016-12-12: 1000 mL via INTRAVENOUS

## 2016-12-12 MED ORDER — VANCOMYCIN HCL 10 G IV SOLR
1250.0000 mg | INTRAVENOUS | Status: AC
  Administered 2016-12-13 – 2016-12-14 (×2): 1250 mg via INTRAVENOUS
  Filled 2016-12-12 (×2): qty 1250

## 2016-12-12 MED ORDER — HEPARIN SODIUM (PORCINE) 5000 UNIT/ML IJ SOLN
5000.0000 [IU] | Freq: Three times a day (TID) | INTRAMUSCULAR | Status: DC
  Administered 2016-12-12 – 2016-12-17 (×14): 5000 [IU] via SUBCUTANEOUS
  Filled 2016-12-12 (×14): qty 1

## 2016-12-12 MED ORDER — ASPIRIN EC 81 MG PO TBEC
81.0000 mg | DELAYED_RELEASE_TABLET | Freq: Every day | ORAL | Status: DC
  Administered 2016-12-12 – 2016-12-19 (×8): 81 mg via ORAL
  Filled 2016-12-12 (×9): qty 1

## 2016-12-12 MED ORDER — PIPERACILLIN-TAZOBACTAM 3.375 G IVPB
3.3750 g | Freq: Three times a day (TID) | INTRAVENOUS | Status: AC
  Administered 2016-12-12 – 2016-12-14 (×7): 3.375 g via INTRAVENOUS
  Filled 2016-12-12 (×8): qty 50

## 2016-12-12 MED ORDER — ONDANSETRON HCL 4 MG/2ML IJ SOLN: 4.0000 mg | Freq: Four times a day (QID) | INTRAMUSCULAR | Status: DC | PRN

## 2016-12-12 MED ORDER — SODIUM CHLORIDE 0.9 % IV SOLN
INTRAVENOUS | Status: DC
  Administered 2016-12-13: 01:00:00 via INTRAVENOUS

## 2016-12-12 MED ORDER — SODIUM CHLORIDE 0.9% FLUSH
3.0000 mL | Freq: Two times a day (BID) | INTRAVENOUS | Status: DC
  Administered 2016-12-13 – 2016-12-19 (×9): 3 mL via INTRAVENOUS

## 2016-12-12 MED ORDER — ACETAMINOPHEN 325 MG PO TABS: 650.0000 mg | ORAL_TABLET | Freq: Four times a day (QID) | ORAL | Status: DC | PRN

## 2016-12-12 MED ORDER — ACETAMINOPHEN 650 MG RE SUPP: 650.0000 mg | Freq: Four times a day (QID) | RECTAL | Status: DC | PRN

## 2016-12-12 MED ORDER — VANCOMYCIN HCL IN DEXTROSE 1-5 GM/200ML-% IV SOLN: 1000.0000 mg | Freq: Once | INTRAVENOUS | Status: DC

## 2016-12-12 MED ORDER — ATORVASTATIN CALCIUM 20 MG PO TABS
20.0000 mg | ORAL_TABLET | Freq: Every day | ORAL | Status: DC
  Administered 2016-12-12 – 2016-12-18 (×7): 20 mg via ORAL
  Filled 2016-12-12 (×7): qty 1

## 2016-12-12 MED ORDER — FENOFIBRATE 160 MG PO TABS
160.0000 mg | ORAL_TABLET | Freq: Every day | ORAL | Status: DC
  Administered 2016-12-12 – 2016-12-19 (×7): 160 mg via ORAL
  Filled 2016-12-12 (×9): qty 1

## 2016-12-12 NOTE — H&P (Signed)
History and Physical        Hospital Admission Note Date: 12/12/2016  Patient name: Jordan Arellano Medical record number: [id number] Date of birth: 1951-02-19 Age: 66 y.o. Gender: male  PCP: Wilfred Lacy, NP   Referring physician: Arlean Hopping PA-C  Patient coming from: home    Chief Complaint:  Nonhealing ulcer left third toe in the last 3 weeks  HPI: Patient is a 66 year old male with history of CVA, PAD, hypertension, hyperlipidemia, right leg BKA presented to ED with nonhealing left third toe ulcer. History was obtained from the patient who reported that he started using new shoes and and they were rubbing against his foot. He has history of neuropathy and peripheral arterial disease, did not notice at first. Subsequently he started having left third toe pain with redness and scraping. Patient noticed small ulcer on the dorsum of the left third toe which has not been healing, subsequently having thick yellow drainage in the last 3 days. Otherwise he denied any fevers or chills. He noticed spreading redness to surrounding area of the feet. He did not seek any medical attention or has not used any antibiotics. He has a prior history of PAD with right BKA done by vascular surgery, almost 10 years ago.  ED work-up/course:  Vital signs stable. BMET stable except BUN 35, creatinine 2.05. BUN 22, creatinine 1.2 in 09/2016 X-ray of the left foot showed distal tuft of the third toe not well seen otherwise remainder normal in appearance  Review of Systems: Positives marked in 'bold' Constitutional: Denies fever, chills, diaphoresis, poor appetite and fatigue.  HEENT: Denies photophobia, eye pain, redness, hearing loss, ear pain, congestion, sore throat, rhinorrhea, sneezing, mouth sores, trouble swallowing, neck pain, neck stiffness and tinnitus.   Respiratory: Denies SOB, DOE, cough,  chest tightness,  and wheezing.   Cardiovascular: Denies chest pain, palpitations and leg swelling.  Gastrointestinal: Denies nausea, vomiting, abdominal pain, diarrhea, constipation, blood in stool and abdominal distention.  Genitourinary: Denies dysuria, urgency, frequency, hematuria, flank pain and difficulty urinating.  Musculoskeletal: Denies myalgias, back pain, joint swelling, arthralgias and gait problem.  Skin: Small ulcer 0.5 cm on the dorsum of the left third toe with surrounding redness Neurological: Denies dizziness, seizures, syncope, weakness, light-headedness, numbness and headaches.  Hematological: Denies adenopathy. Easy bruising, personal or family bleeding history  Psychiatric/Behavioral: Denies suicidal ideation, mood changes, confusion, nervousness, sleep disturbance and agitation  Past Medical History: Past Medical History:  Diagnosis Date  . Depression   . Hyperlipidemia   . Hypertension   . PAD (peripheral artery disease) ([place]) ~2007   s/p R BKA for non-healing wound  . Stroke ([place]) 03/2014   MRI: Acute nonhemorrhagic left paracentral pontine infarct. 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  Marland Kitchen TIA (transient ischemic attack) 01/2014    Past Surgical History:  Procedure Laterality Date  . BELOW KNEE LEG AMPUTATION Right   . TRANSTHORACIC ECHOCARDIOGRAM  01/2014   To evaluate possible CVA: EF 55-60%. GR 1 DD. No significant valvular lesions    Medications: Prior to Admission medications   Medication Sig Start Date End Date Taking? Authorizing Provider  aspirin 81 MG tablet Take 81 mg by mouth daily.   Yes Historical Provider, MD  atorvastatin (LIPITOR) 20 MG tablet Take 1 tablet (20 mg total) by mouth daily at 6 PM. 10/01/16  Yes Charlene Brooke Nche, NP  buPROPion (WELLBUTRIN SR) 150 MG 12 hr tablet Take 150 mg by mouth daily.  09/15/16  Yes Historical Provider, MD    diphenhydramine-acetaminophen (TYLENOL PM) 25-500 MG TABS tablet Take 2 tablets by mouth at bedtime as needed.   Yes Historical Provider, MD  DULoxetine (CYMBALTA) 60 MG capsule Take 1 capsule (60 mg total) by mouth 2 (two) times daily. 09/30/16  Yes Charlene Brooke Nche, NP  fenofibrate (TRICOR) 145 MG tablet Take 145 mg by mouth daily.  09/15/16  Yes Historical Provider, MD  gabapentin (NEURONTIN) 600 MG tablet Take 600 mg by mouth daily. 09/22/16  Yes Historical Provider, MD  hydrochlorothiazide (HYDRODIURIL) 25 MG tablet Take 1 tablet (25 mg total) by mouth daily. 09/30/16  Yes Charlene Brooke Nche, NP  olmesartan (BENICAR) 40 MG tablet Take 1 tablet (40 mg total) by mouth daily. 10/11/16  Yes Flossie Buffy, NP  omega-3 acid ethyl esters (LOVAZA) 1 g capsule Take 2 capsules (2 g total) by mouth 2 (two) times daily. 10/11/16  Yes Leonie Man, MD  sertraline (ZOLOFT) 100 MG tablet Take 100 mg by mouth daily.  09/06/16  Yes Historical Provider, MD  UNABLE TO FIND 1 Units by Other route daily. Med Name: Clinical research associate Need due to hx of CVA with left side weakness, left BKA, weak upper body strength. Patient not taking: Reported on 12/12/2016 11/03/16   Flossie Buffy, NP    Allergies:  No Known Allergies  Social History:  reports that he has been smoking Cigarettes.  He has a 17.50 pack-year smoking history. He has never used smokeless tobacco. He reports that he does not drink alcohol or use drugs.  Family History: Family History  Problem Relation Age of Onset  . Hypertension Mother     Does not know history  . Heart disease Mother   . Stroke Mother   . Diabetes Mother   . Hypertension Father   . Heart disease Father   . Stroke Father   . Diabetes Sister   . Hypertension Sister   . Heart disease Brother   . Hypertension Brother     Physical Exam: Blood pressure 114/75, pulse 102, temperature 98.5 F (36.9 C), temperature source Oral, resp. rate 20, SpO2 96  %. General: Alert, awake, oriented x3, in no acute distress. HEENT: normocephalic, atraumatic, anicteric sclera, pink conjunctiva, pupils equal and reactive to light and accomodation, oropharynx clear Neck: supple, no masses or lymphadenopathy, no goiter, no bruits  Heart: Regular rate and rhythm, without murmurs, rubs or gallops. Lungs: Clear to auscultation bilaterally, no wheezing, rales or rhonchi. Abdomen: Soft, nontender, nondistended, positive bowel sounds, no masses. Extremities: No clubbing, cyanosis  feeble pulses right BKA Neuro: Grossly intact, no focal neurological deficits, strength 5/5 upper and lower extremities bilaterally Psych: alert and oriented x 3, normal mood and affect Skin: Small ulcer 0.5 cm on the dorsum of the left third toe with surrounding redness   LABS on Admission:  Basic Metabolic Panel:  Recent Labs Lab 12/12/16 1425  NA 137  K 4.0  CL 106  CO2 18*  GLUCOSE 255*  BUN 35*  CREATININE 2.05*  CALCIUM 9.7   Liver Function Tests:  Recent Labs Lab 12/12/16 1425  AST 23  ALT 19  ALKPHOS 48  BILITOT 0.7  PROT 7.6  ALBUMIN 3.7   No results for input(s): LIPASE, AMYLASE in the last 168 hours. No results for input(s): AMMONIA in the last 168 hours. CBC:  Recent Labs Lab 12/12/16 1425  WBC 11.3*  NEUTROABS 7.5  HGB 15.6  HCT 45.1  MCV 90.7  PLT 295   Cardiac Enzymes: No results for input(s): CKTOTAL, CKMB, CKMBINDEX, TROPONINI in the last 168 hours. BNP: Invalid input(s): POCBNP CBG: No results for input(s): GLUCAP in the last 168 hours.  Radiological Exams on Admission:  No results found.  *I have personally reviewed the images above*  EKG: Independently reviewed. Rate 94, normal sinus rhythm   Assessment/Plan Principal Problem:   Foot ulcer, left ([place]), nonhealing with underlying history of PAD, neuropathy - Placed on cellulitis order set, vancomycin and Zosyn IV per pharmacy - Elevated ESR, CRP, obtain blood  cultures - Obtain ABIs, MRI of the left foot - Vascular surgery consulted, discussed with Dr Donnetta Hutching  Active Problems:   Essential hypertension - Currently stable, hold off on HCTZ, Benicar due to acute kidney injury  Acute kidney injury: Baseline creatinine 1.2 - Likely due to #1, and medications including HCTZ, Benicar - Placed on gentle hydration, hold off on HCTZ and Benicar - Renal dose vancomycin per pharmacy     Tobacco abuse - Counseled strongly on tobacco cessation, placed on nicotine patch    Hyperlipidemia - Continue Lipitor, lovaza, TriCor    S/P unilateral BKA (below knee amputation), right (HCC)  Known history of  Vascular disease, peripheral ([place]) - Continue aspirin, statin, follow ABIs   DVT prophylaxis: heparin SQ  CODE STATUS: Full code  Consults called: vascular surgery  Family Communication: Admission, patients condition and plan of care including tests being ordered have been discussed with the patient  who indicates understanding and agree with the plan and Code Status  Admission status: inpatient tele   Disposition plan: Further plan will depend as patient's clinical course evolves and further radiologic and laboratory data become available. Likely home when ready   At the time of admission, it appears that the appropriate admission status for this patient is INPATIENT . This is judged to be reasonable and necessary in order to provide the required intensity of service to ensure the patient's safety given the presenting symptoms, physical exam findings, and initial radiographic and laboratory data in the context of their chronic comorbidities.     Time Spent on Admission: 76mns     Lorelle Macaluso M.D. [facility] 12/12/2016, 5:18 PM Pager: [phone] If 7PM-7AM, please contact night-coverage [email or web address] Password [email or web address]

## 2016-12-12 NOTE — ED Provider Notes (Signed)
[facility] DEPT Provider Note   CSN: [id number] Arrival date & time: 12/12/16  1259  By signing my name below, I, Norris Cross, attest that this documentation has been prepared under the direction and in the presence of Kimesha Claxton, PA-C. Electronically Signed: Norris Cross , ED Scribe. 12/12/16. 4:28 PM.   History   Chief Complaint Chief Complaint  Patient presents with  . Toe Pain    HPI Comments: Jordan Arellano is a 66 y.o. male with hx of CVA, PAD, and R leg AKA who presents to the Emergency Department complaining of worsening R 3rd toe ulcer with onset x3-4 weeks ago. Daughter noticed the depth of the ulcer and decided to bring pt in to be evaluated. Pt has hx of R lower extremity amputation due to peripheral artery disease. Pt reports chronic numbness to the L lower extremity since a stroke x2 years ago. Pt reports thick, yellow drainage from the L 3rd toe x1 week. Patient denies fever/chills, spreading redness, or any other complaints. Denies antibiotic use in the last 3 months. Patient does not remember who performed his previous amputation.  Patient is a pack and a half per day smoker.  The history is provided by the patient. No language interpreter was used.    Past Medical History:  Diagnosis Date  . Depression   . Hyperlipidemia   . Hypertension   . PAD (peripheral artery disease) ([place]) ~2007   s/p R BKA for non-healing wound  . Stroke ([place]) 03/2014   MRI: Acute nonhemorrhagic left paracentral pontine infarct. 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  Marland Kitchen TIA (transient ischemic attack) 01/2014    Patient Active Problem List   Diagnosis Date Noted  . Wound infection 12/12/2016  . Hypertriglyceridemia 10/01/2016  . Hyperlipidemia 09/30/2016  . S/P unilateral BKA (below knee amputation), right ([place]) 09/30/2016  . Neuropathy ([place]) 09/30/2016  . Vascular disease, peripheral ([place])  09/30/2016  . Phantom pain after amputation of lower extremity ([place]) 09/30/2016  . CVA (cerebral vascular accident) ([place]) 03/25/2014  . Essential hypertension 03/25/2014  . Tobacco abuse 03/25/2014    Past Surgical History:  Procedure Laterality Date  . BELOW KNEE LEG AMPUTATION Right   . TRANSTHORACIC ECHOCARDIOGRAM  01/2014   To evaluate possible CVA: EF 55-60%. GR 1 DD. No significant valvular lesions       Home Medications    Prior to Admission medications   Medication Sig Start Date End Date Taking? Authorizing Provider  aspirin 81 MG tablet Take 81 mg by mouth daily.    Historical Provider, MD  atorvastatin (LIPITOR) 20 MG tablet Take 1 tablet (20 mg total) by mouth daily at 6 PM. 10/01/16   Flossie Buffy, NP  buPROPion [facility] SR) 150 MG 12 hr tablet  09/15/16   Historical Provider, MD  DULoxetine (CYMBALTA) 60 MG capsule Take 1 capsule (60 mg total) by mouth 2 (two) times daily. 09/30/16   Flossie Buffy, NP  fenofibrate (TRICOR) 145 MG tablet  09/15/16   Historical Provider, MD  hydrochlorothiazide (HYDRODIURIL) 25 MG tablet Take 1 tablet (25 mg total) by mouth daily. 09/30/16   Flossie Buffy, NP  olmesartan (BENICAR) 40 MG tablet Take 1 tablet (40 mg total) by mouth daily. 10/11/16   Flossie Buffy, NP  omega-3 acid ethyl esters (LOVAZA) 1 g capsule Take 2 capsules (2 g total) by mouth 2 (two) times daily. 10/11/16   Leonie Man, MD  sertraline (ZOLOFT) 100 MG tablet  09/06/16   Historical Provider, MD  UNABLE TO FIND 1 Units by Other route daily. Med Name: Clinical research associate Need due to hx of CVA with left side weakness, left BKA, weak upper body strength. 11/03/16   Flossie Buffy, NP    Family History Family History  Problem Relation Age of Onset  . Hypertension Mother     Does not know history  . Heart disease Mother   . Stroke Mother   . Diabetes Mother   . Hypertension Father   . Heart disease Father   . Stroke Father   .  Diabetes Sister   . Hypertension Sister   . Heart disease Brother   . Hypertension Brother     Social History Social History  Substance Use Topics  . Smoking status: Current Some Day Smoker    Packs/day: 0.50    Years: 35.00    Types: Cigarettes  . Smokeless tobacco: Never Used  . Alcohol use No     Allergies   Patient has no known allergies.   Review of Systems Review of Systems  Constitutional: Negative for chills and fever.  Respiratory: Negative for shortness of breath.   Gastrointestinal: Negative for nausea and vomiting.  Skin: Positive for wound (ulcer to the L 3rd toe).  Neurological: Negative for weakness and numbness (chronic, no acute).  All other systems reviewed and are negative.    Physical Exam Updated Vital Signs BP 141/85   Pulse 97   Temp 98.5 F (36.9 C) (Oral)   Resp 18   SpO2 98%   Physical Exam  Constitutional: He appears well-developed and well-nourished. No distress.  HENT:  Head: Normocephalic and atraumatic.  Eyes: Conjunctivae are normal.  Neck: Neck supple.  Cardiovascular: Normal rate, regular rhythm, normal heart sounds and intact distal pulses.   Pulmonary/Chest: Effort normal and breath sounds normal. No respiratory distress.  Abdominal: Soft. There is no tenderness. There is no guarding.  Musculoskeletal: Normal range of motion. He exhibits no edema.       Left foot: There is swelling.  Stage 3-4 ulcer to the dorsum of the L 3rd toe with surrounding swelling and erythema. Motor function is intact in the toes but the distal part of the foot is cool to the touch. Capillary refill is present into the toes, but delayed. PT pulse is able to be palpated, but diminished. DP is severely diminished and only barely able to be appreciated with Doppler. Right lower leg surgically absent.  Feet:  Right Foot: amputated Left Foot:  Skin Integrity: Positive for ulcer.  Lymphadenopathy:    He has no cervical adenopathy.  Neurological: He  is alert.  Skin: Skin is warm and dry. He is not diaphoretic.  Psychiatric: He has a normal mood and affect. His behavior is normal.  Nursing note and vitals reviewed.    ED Treatments / Results   DIAGNOSTIC STUDIES: Oxygen Saturation is 98% on RA, normal by my interpretation.   COORDINATION OF CARE: 4:28 PM-Discussed next steps with pt. Pt verbalized understanding and is agreeable with the plan.    Labs (all labs ordered are listed, but only abnormal results are displayed) Labs Reviewed  CBC WITH DIFFERENTIAL/PLATELET - Abnormal; Notable for the following:       Result Value   WBC 11.3 (*)    All other components within normal limits  COMPREHENSIVE METABOLIC PANEL - Abnormal; Notable for the following:    CO2 18 (*)  Glucose, Bld 255 (*)    BUN 35 (*)    Creatinine, Ser 2.05 (*)    GFR calc non Af Amer 33 (*)    GFR calc Af Amer 38 (*)    All other components within normal limits  SEDIMENTATION RATE - Abnormal; Notable for the following:    Sed Rate 60 (*)    All other components within normal limits  C-REACTIVE PROTEIN - Abnormal; Notable for the following:    CRP 14.7 (*)    All other components within normal limits    EKG  EKG Interpretation None       Radiology Dg Foot Complete Left  Result Date: 12/12/2016 CLINICAL DATA:  Ulcer along dorsum of third toe. EXAM: LEFT FOOT - COMPLETE 3+ VIEW COMPARISON:  None. FINDINGS: No fractures or dislocations. No soft tissue gas identified. The distal tuft of the third toe is not well evaluated but there is no definitive erosion in this region. The remainder of the third digit is well seen with no abnormality. IMPRESSION: The distal tuft of the third toe is not well seen on this study. The remainder of the third toe is normal in appearance. Electronically Signed   By: Dorise Bullion III M.D   On: 12/12/2016 14:16    Procedures Procedures (including critical care time)  Medications Ordered in ED Medications  sodium  chloride 0.9 % bolus 1,000 mL (not administered)  vancomycin (VANCOCIN) IVPB 1000 mg/200 mL premix (0 mg Intravenous Stopped 12/12/16 1622)     Initial Impression / Assessment and Plan / ED Course  I have reviewed the triage vital signs and the nursing notes.  Pertinent labs & imaging results that were available during my care of the patient were reviewed by me and considered in my medical decision making (see chart for details).  Clinical Course     Patient presents with a foot ulcer with surrounding signs of infection. No signs of sepsis currently. Patient still has circulation to the extremity, but decreased. Increase in creatinine indicating likely AKA may be due to dehydration. Patient will need admission for IV antibiotics and likely evaluation by vascular surgery.  Findings and plan of care discussed with Julianne Rice, MD. Dr. Lita Mains personally evaluated and examined this patient.   4:20 PM Spoke with Dr. Tana Coast, hospitalist, who agreed to admit the patient to inpatient telemetry.    Vitals:   12/12/16 1314 12/12/16 1500  BP: 141/85 127/82  Pulse: 97 89  Resp: 18 16  Temp: 98.5 F (36.9 C)   TempSrc: Oral   SpO2: 98% 96%     Final Clinical Impressions(s) / ED Diagnoses   Final diagnoses:  Wound infection    New Prescriptions New Prescriptions   No medications on file   I personally performed the services described in this documentation, which was scribed in my presence. The recorded information has been reviewed and is accurate.    Lorayne Bender, PA-C 12/12/16 1634    Julianne Rice, MD 12/15/16 1247

## 2016-12-12 NOTE — ED Notes (Signed)
Attempted report x1. 

## 2016-12-12 NOTE — Progress Notes (Signed)
Pharmacy Antibiotic Note  Jordan Arellano is a 66 y.o. male admitted on 12/12/2016 with cellulitis.  Pharmacy has been consulted for vancomycin and zosyn dosing. Pt is afebrile and WBC is mildly elevated at 11.3. Scr is elevated above baseline at 2.05. Received first dose of vancomycin earlier today per MD.   Plan: Zosyn 3.375gm IV Q8H (4 hr inf) Vanc 1250mg  IV Q24H  F/u renal fxn, C&S, clinical status and trough at SS  Weight: 169 lb 8.5 oz (76.9 kg)  Temp (24hrs), Avg:98.2 F (36.8 C), Min:97.9 F (36.6 C), Max:98.5 F (36.9 C)   Recent Labs Lab 12/12/16 1425  WBC 11.3*  CREATININE 2.05*    Estimated Creatinine Clearance: 39.6 mL/min (by C-G formula based on SCr of 2.05 mg/dL (H)).    No Known Allergies  Antimicrobials this admission: Vanc 1/7>> Zosyn 1/7>>  Dose adjustments this admission: N/A  Microbiology results: Pending  Thank you for allowing pharmacy to be a part of this patient's care.  Bobbie Virden, Rande Lawman 12/12/2016 7:47 PM

## 2016-12-12 NOTE — ED Notes (Signed)
Pt returned from MRI °

## 2016-12-12 NOTE — ED Notes (Signed)
Pt to go to MRI before being transported to floor.

## 2016-12-12 NOTE — ED Triage Notes (Signed)
Left  3 rd toe pain , red and has a scrape states put on n ew shoes and they rubbed,

## 2016-12-12 NOTE — ED Notes (Signed)
Per MRI, pt to be transported back to the ED before being transported upstairs.

## 2016-12-13 ENCOUNTER — Inpatient Hospital Stay (HOSPITAL_COMMUNITY): Payer: Medicare HMO

## 2016-12-13 DIAGNOSIS — I739 Peripheral vascular disease, unspecified: Secondary | ICD-10-CM

## 2016-12-13 DIAGNOSIS — L97529 Non-pressure chronic ulcer of other part of left foot with unspecified severity: Secondary | ICD-10-CM

## 2016-12-13 LAB — BASIC METABOLIC PANEL
ANION GAP: 7 (ref 5–15)
BUN: 29 mg/dL — ABNORMAL HIGH (ref 6–20)
CALCIUM: 8.7 mg/dL — AB (ref 8.9–10.3)
CO2: 24 mmol/L (ref 22–32)
Chloride: 109 mmol/L (ref 101–111)
Creatinine, Ser: 1.57 mg/dL — ABNORMAL HIGH (ref 0.61–1.24)
GFR, EST AFRICAN AMERICAN: 52 mL/min — AB (ref >60)
GFR, EST NON AFRICAN AMERICAN: 45 mL/min — AB (ref >60)
Glucose, Bld: 171 mg/dL — ABNORMAL HIGH (ref 65–99)
POTASSIUM: 4.2 mmol/L (ref 3.5–5.1)
Sodium: 140 mmol/L (ref 135–145)

## 2016-12-13 LAB — CBC
HCT: 39 % (ref 39.0–52.0)
HEMOGLOBIN: 12.9 g/dL — AB (ref 13.0–17.0)
MCH: 30.6 pg (ref 26.0–34.0)
MCHC: 33.1 g/dL (ref 30.0–36.0)
MCV: 92.6 fL (ref 78.0–100.0)
Platelets: 240 10*3/uL (ref 150–400)
RBC: 4.21 MIL/uL — AB (ref 4.22–5.81)
RDW: 15.3 % (ref 11.5–15.5)
WBC: 9.7 10*3/uL (ref 4.0–10.5)

## 2016-12-13 MED ORDER — SODIUM CHLORIDE 0.9 % IV SOLN
INTRAVENOUS | Status: AC
  Administered 2016-12-13 – 2016-12-14 (×2): via INTRAVENOUS

## 2016-12-13 MED ORDER — HYDRALAZINE HCL 20 MG/ML IJ SOLN: 10.0000 mg | Freq: Four times a day (QID) | INTRAMUSCULAR | Status: DC | PRN

## 2016-12-13 NOTE — Progress Notes (Signed)
VASCULAR LAB PRELIMINARY  ARTERIAL  ABI completed:    RIGHT    LEFT    PRESSURE WAVEFORM  PRESSURE WAVEFORM  BRACHIAL 133 Triphasic BRACHIAL 136 Tripjasic  DP BKA  DP  Absent  AT BKA  AT 18 Severely dampened monophasic  PT BKA  PT  Absent  PER BKA  PER 31 Severely dampened monophasic  GREAT TOE BKA NA GREAT TOE 38 Dampened     RIGHT LEFT  ABI BKA 0.23   Right ABI could not be evaluated -patient has a below the knee amputation. Left - ABI and Doppler waveforms indicate a critical reduction in arterial flow at rest.  Jordan Arellano, RVS 12/13/2016, 12:30 PM

## 2016-12-13 NOTE — Progress Notes (Signed)
Inpatient Diabetes Program Recommendations  AACE/ADA: New Consensus Statement on Inpatient Glycemic Control (2015)  Target Ranges:  Prepandial:   less than 140 mg/dL      Peak postprandial:   less than 180 mg/dL (1-2 hours)      Critically ill patients:  140 - 180 mg/dL   Review of Glycemic Control  Diabetes history: None Current orders for Inpatient glycemic control: None  Inpatient Diabetes Program Recommendations:   Initial lab glucose 255 mg/dl on admission. Fasting glucose 171 this am. Please consider an A1c level to assess glucose levels over the past 2-3 months. Last A1c on file was back in 2015 at Pre DM level.  Thanks,  Tama Headings RN, MSN, [facility] Inpatient Diabetes Coordinator Team Pager [phone] (8a-5p)

## 2016-12-13 NOTE — Consult Note (Signed)
[facility]  Patient name: Jordan Arellano MRN: [id number] DOB: [birth year] Sex: male  REASON FOR CONSULT: toe ulcer, consult is from Dr. Tana Coast with [facility]  HPI: Richy Didier is a 66 y.o. male, who presents with 4 weeks history of left 3rd toe ulceration and erythema. The patient states that he developed a blister on his toe after wearing a new pair of shoes. This blister progressed and the patient presented to the hospital due to encouragement from his niece. He denies any drainage, fever or chills. He denies any pain to his toe. 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 He denies any other non-healing wounds, claudication and rest pain. He ambulates with a prosthesis. He lives at home with his niece.   He smokes 1/2 pack per day. He is not diabetic. He is on a statin for hyperlipidemia and on an ARB and HCTZ for hyperlipidemia. He has a prior history of CVA. Denies any history of CAD.   He is unaware of any kidney issues.   Past Medical History:  Diagnosis Date  . Depression   . Hyperlipidemia   . Hypertension   . PAD (peripheral artery disease) ([place]) ~2007   s/p R BKA for non-healing wound  . Stroke ([place]) 03/2014   MRI: Acute nonhemorrhagic left paracentral pontine infarct. 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  [place] TIA (transient ischemic attack) 01/2014    Family History  Problem Relation Age of Onset  . Hypertension Mother     Does not know history  . Heart disease Mother   . Stroke Mother   . Diabetes Mother   . Hypertension Father   . Heart disease Father   . Stroke Father   . Diabetes Sister   . Hypertension Sister   . Heart disease Brother   . Hypertension Brother     SOCIAL HISTORY: Social History   Social History  . Marital status: Single    Spouse name: N/A  . Number of children: N/A   . Years of education: N/A   Occupational History  . Not on file.   Social History Main Topics  . Smoking status: Current Some Day Smoker    Packs/day: 0.50    Years: 35.00    Types: Cigarettes  . Smokeless tobacco: Never Used  . Alcohol use No  . Drug use: No  . Sexual activity: Not on file   Other Topics Concern  . Not on file   Social History Narrative  . No narrative on file    No Known Allergies  Current Facility-Administered Medications  Medication Dose Route Frequency Provider Last Rate Last Dose  . 0.9 %  sodium chloride infusion   Intravenous Continuous Ripudeep Krystal Eaton, MD 100 mL/hr at 12/13/16 0103    . acetaminophen (TYLENOL) tablet 650 mg  650 mg Oral Q6H PRN Ripudeep Krystal Eaton, MD       Or  . acetaminophen (TYLENOL) suppository 650 mg  650 mg Rectal Q6H PRN Ripudeep Krystal Eaton, MD      . aspirin EC tablet 81 mg  81 mg Oral Daily Ripudeep Krystal Eaton, MD   81 mg at 12/12/16 2013  . atorvastatin (LIPITOR) tablet 20 mg  20 mg Oral q1800 Ripudeep Krystal Eaton, MD   20 mg at 12/12/16 2013  . buPROPion [facility] SR) 12  hr tablet 150 mg  150 mg Oral Daily Ripudeep Krystal Eaton, MD   150 mg at 12/12/16 2013  . DULoxetine (CYMBALTA) DR capsule 60 mg  60 mg Oral BID Ripudeep Krystal Eaton, MD   60 mg at 12/12/16 2200  . fenofibrate tablet 160 mg  160 mg Oral Daily Ripudeep Krystal Eaton, MD   160 mg at 12/12/16 2046  . gabapentin (NEURONTIN) tablet 600 mg  600 mg Oral QHS Ripudeep K Rai, MD   600 mg at 12/12/16 2200  . heparin injection 5,000 Units  5,000 Units Subcutaneous Q8H Ripudeep Krystal Eaton, MD   5,000 Units at 12/13/16 0529  . HYDROcodone-acetaminophen (NORCO/VICODIN) 5-325 MG per tablet 1-2 tablet  1-2 tablet Oral Q4H PRN Ripudeep K Rai, MD      . nicotine (NICODERM CQ - dosed in mg/24 hours) patch 21 mg  21 mg Transdermal Daily Ripudeep Krystal Eaton, MD   21 mg at 12/12/16 2012  . omega-3 acid ethyl esters (LOVAZA) capsule 2 g  2 g Oral BID Ripudeep K Rai, MD   2 g at 12/12/16 2200  . ondansetron (ZOFRAN) tablet 4 mg   4 mg Oral Q6H PRN Ripudeep K Rai, MD       Or  . ondansetron (ZOFRAN) injection 4 mg  4 mg Intravenous Q6H PRN Ripudeep K Rai, MD      . piperacillin-tazobactam (ZOSYN) IVPB 3.375 g  3.375 g Intravenous Q8H Rachel L Rumbarger, RPH   3.375 g at 12/13/16 0529  . sertraline (ZOLOFT) tablet 100 mg  100 mg Oral Daily Ripudeep Krystal Eaton, MD   100 mg at 12/12/16 2013  . sodium chloride flush (NS) 0.9 % injection 3 mL  3 mL Intravenous Q12H Ripudeep K Rai, MD      . vancomycin (VANCOCIN) 1,250 mg in sodium chloride 0.9 % 250 mL IVPB  1,250 mg Intravenous Q24H Rachel L Rumbarger, RPH        REVIEW OF SYSTEMS:  [X]  denotes positive finding, [ ]  denotes negative finding Cardiac  Comments:  Chest pain or chest pressure:    Shortness of breath upon exertion:    Short of breath when lying flat:    Irregular heart rhythm:        Vascular    Pain in calf, thigh, or hip brought on by ambulation:    Pain in feet at night that wakes you up from your sleep:     Blood clot in your veins:    Leg swelling:         Pulmonary    Oxygen at home:    Productive cough:     Wheezing:         Neurologic    Sudden weakness in arms or legs:     Sudden numbness in arms or legs:     Sudden onset of difficulty speaking or slurred speech:    Temporary loss of vision in one eye:     Problems with dizziness:         Gastrointestinal    Blood in stool:     Vomited blood:         Genitourinary    Burning when urinating:     Blood in urine:        Psychiatric    Major depression:         Hematologic    Bleeding problems:    Problems with blood clotting too easily:        Skin  Rashes or ulcers: x       Constitutional    Fever or chills:      PHYSICAL EXAM: Vitals:   12/12/16 1600 12/12/16 1700 12/12/16 1937 12/13/16 0535  BP: 114/75 150/66 134/81 130/71  Pulse: 102 80 81 73  Resp: 20 12 15 17   Temp:   97.9 F (36.6 C) 98.2 F (36.8 C)  TempSrc:   Oral Oral  SpO2: 96% 98% 97% 99%  Weight:    169 lb 8.5 oz (76.9 kg)     GENERAL: The patient is a well-nourished male, in no acute distress. The vital signs are documented above. CARDIAC: There is a regular rate and rhythm. No carotid bruits.  VASCULAR: 2+ radial pulses bilaterally. 1-2+ left femoral pulse. Nonpalpable right femoral pulse. Non palpable left popliteal and pedal pulses. Both doppler boxes on the floor are not functioning. I have placed one on the charger. Left 3rd toe erythematous with ulceration to dorsal aspect. No drainage or purulence. No gangrene PULMONARY: There is good air exchange bilaterally without wheezing or rales. ABDOMEN: Soft and non-tender with normal pitched bowel sounds.  MUSCULOSKELETAL: Right BKA NEUROLOGIC: Sensation and motor function intact left foot. No focal deficits.  SKIN: See vascular section above.  PSYCHIATRIC: The patient has a normal affect.  MEDICAL ISSUES: Left 3rd toe ulceration and cellulitis AKI  The patient will need an arteriogram to evaluate blood flow to his left lower extremity once his renal function is back to baseline. Continue hydration and antibiotics. Dr. Donnetta Hutching to see patient.    Virgina Jock, PA-C [facility] [place]

## 2016-12-13 NOTE — Progress Notes (Signed)
PROGRESS NOTE                                                                                                                                                                                                             Patient Demographics:    Jordan Arellano, is a 66 y.o. male, DOB - June 03, 1951, [id number]  Admit date - 12/12/2016   Admitting Physician Ripudeep Krystal Eaton, MD  Outpatient Primary MD for the patient is Wilfred Lacy, NP  LOS - 1  Chief Complaint  Patient presents with  . Toe Pain       Brief Narrative Patient is a 66 year old male with history of CVA, PAD, hypertension, hyperlipidemia, ongoing smoking, right leg BKA presented to ED with nonhealing left third toe ulcer.   Subjective:    Jordan Arellano today has, No headache, No chest pain, No abdominal pain - No Nausea, No new weakness tingling or numbness, No Cough - SOB.     Assessment  & Plan :     1.Left third toe ulcer with history of PAD and signs of chronic ischemia in the left leg. Mild surrounding cellulitis, for now IV antibiotics to continue for another 24-48 hours, follow cultures thereafter transitioned to oral doxycycline, vascular surgery following likely will require arteriogram once creatinine is improved. MRI does not show osteomyelitis.  2. PAD with history of right BKA. Ongoing smoking. Ulcer to quit smoking, continue aspirin and statin for secondary prevention.   3. ARF. Baseline creatinine 1.2. ARB and HCTZ held, hydrated monitor. Pharmacy monitoring vancomycin.  4. Smoking. Counseled to quit, on neck and him patch.  5. Dyslipidemia. On statin.  6. Past right BKA, has prosthesis, continue supportive care.  7. Essential hypertension. Currently stable will monitor on as needed IV hydralazine.   Family Communication  :  None  Code Status :  Full  Diet : Diet Heart Room service appropriate? Yes; Fluid consistency: Thin    Disposition Plan  :  Stay inpt  Consults  :  VVS  Procedures  :    DVT Prophylaxis  :  Heparin   Lab Results  Component Value Date   PLT 240 12/13/2016    Inpatient Medications  Scheduled Meds: . aspirin EC  81 mg Oral Daily  . atorvastatin  20 mg Oral q1800  . buPROPion  150 mg  Oral Daily  . DULoxetine  60 mg Oral BID  . fenofibrate  160 mg Oral Daily  . gabapentin  600 mg Oral QHS  . heparin  5,000 Units Subcutaneous Q8H  . nicotine  21 mg Transdermal Daily  . omega-3 acid ethyl esters  2 g Oral BID  . piperacillin-tazobactam (ZOSYN)  IV  3.375 g Intravenous Q8H  . sertraline  100 mg Oral Daily  . sodium chloride flush  3 mL Intravenous Q12H  . vancomycin  1,250 mg Intravenous Q24H   Continuous Infusions: . sodium chloride     PRN Meds:.acetaminophen **OR** acetaminophen, HYDROcodone-acetaminophen, ondansetron **OR** ondansetron (ZOFRAN) IV  Antibiotics  :    Anti-infectives    Start     Dose/Rate Route Frequency Ordered Stop   12/13/16 1200  vancomycin (VANCOCIN) 1,250 mg in sodium chloride 0.9 % 250 mL IVPB     1,250 mg 166.7 mL/hr over 90 Minutes Intravenous Every 24 hours 12/12/16 1946     12/12/16 2100  piperacillin-tazobactam (ZOSYN) IVPB 3.375 g     3.375 g 12.5 mL/hr over 240 Minutes Intravenous Every 8 hours 12/12/16 1946     12/12/16 1945  vancomycin (VANCOCIN) IVPB 1000 mg/200 mL premix  Status:  Discontinued     1,000 mg 200 mL/hr over 60 Minutes Intravenous  Once 12/12/16 1940 12/12/16 1943   12/12/16 1415  vancomycin (VANCOCIN) IVPB 1000 mg/200 mL premix     1,000 mg 200 mL/hr over 60 Minutes Intravenous  Once 12/12/16 1413 12/12/16 1622         Objective:   Vitals:   12/12/16 1600 12/12/16 1700 12/12/16 1937 12/13/16 0535  BP: 114/75 150/66 134/81 130/71  Pulse: 102 80 81 73  Resp: 20 12 15 17   Temp:   97.9 F (36.6 C) 98.2 F (36.8 C)  TempSrc:   Oral Oral  SpO2: 96% 98% 97% 99%  Weight:   76.9 kg (169 lb 8.5 oz)     Wt  Readings from Last 3 Encounters:  12/12/16 76.9 kg (169 lb 8.5 oz)  11/03/16 78.9 kg (174 lb)  10/11/16 79.4 kg (175 lb)     Intake/Output Summary (Last 24 hours) at 12/13/16 1112 Last data filed at 12/13/16 0913  Gross per 24 hour  Intake          1113.33 ml  Output              650 ml  Net           463.33 ml     Physical Exam  Awake Alert, Oriented X 3, No new F.N deficits, Normal affect .AT,PERRAL Supple Neck,No JVD, No cervical lymphadenopathy appriciated.  Symmetrical Chest wall movement, Good air movement bilaterally, CTAB RRR,No Gallops,Rubs or new Murmurs, No Parasternal Heave +ve B.Sounds, Abd Soft, No tenderness, No organomegaly appriciated, No rebound - guarding or rigidity. No Cyanosis, Clubbing or edema, No new Rash or bruise, L. lower extremity extremely poor pulsations, signs of chronic ischemia with head loss, 3rd toe has an ulcer on the dorsal aspect of about 4 mm in diameter with mild surrounding cellulitis, right BKA    Data Review:    CBC  Recent Labs Lab 12/12/16 1425 12/13/16 0510  WBC 11.3* 9.7  HGB 15.6 12.9*  HCT 45.1 39.0  PLT 295 240  MCV 90.7 92.6  MCH 31.4 30.6  MCHC 34.6 33.1  RDW 14.5 15.3  LYMPHSABS 2.9  --   MONOABS 0.6  --   EOSABS 0.2  --  BASOSABS 0.1  --     Chemistries   Recent Labs Lab 12/12/16 1425 12/13/16 0510  NA 137 140  K 4.0 4.2  CL 106 109  CO2 18* 24  GLUCOSE 255* 171*  BUN 35* 29*  CREATININE 2.05* 1.57*  CALCIUM 9.7 8.7*  AST 23  --   ALT 19  --   ALKPHOS 48  --   BILITOT 0.7  --    ------------------------------------------------------------------------------------------------------------------ No results for input(s): CHOL, HDL, LDLCALC, TRIG, CHOLHDL, LDLDIRECT in the last 72 hours.  Lab Results  Component Value Date   HGBA1C 6.2 (H) 03/26/2014   ------------------------------------------------------------------------------------------------------------------ No results for input(s):  TSH, T4TOTAL, T3FREE, THYROIDAB in the last 72 hours.  Invalid input(s): FREET3 ------------------------------------------------------------------------------------------------------------------ No results for input(s): VITAMINB12, FOLATE, FERRITIN, TIBC, IRON, RETICCTPCT in the last 72 hours.  Coagulation profile No results for input(s): INR, PROTIME in the last 168 hours.  No results for input(s): DDIMER in the last 72 hours.  Cardiac Enzymes No results for input(s): CKMB, TROPONINI, MYOGLOBIN in the last 168 hours.  Invalid input(s): CK ------------------------------------------------------------------------------------------------------------------ No results found for: BNP  Micro Results No results found for this or any previous visit (from the past 240 hour(s)).  Radiology Reports Mr Foot Left Wo Contrast  Result Date: 12/13/2016 CLINICAL DATA:  Nonhealing ulcer on the third toe of the left foot. EXAM: MRI OF THE LEFT FOOT WITHOUT CONTRAST TECHNIQUE: Multiplanar, multisequence MR imaging of the left forefoot was performed. No intravenous contrast was administered. COMPARISON:  Radiographs dated 12/12/2016 FINDINGS: Bones/Joint/Cartilage No osteomyelitis, bone edema, or other significant abnormalities. Ligaments Normal. Muscles and Tendons Normal. Soft tissues Normal. IMPRESSION: Normal MRI of the left forefoot. Specifically, no evidence of osteomyelitis or abscess of the third toe. Electronically Signed   By: Lorriane Shire M.D.   On: 12/13/2016 07:53   Dg Foot Complete Left  Result Date: 12/12/2016 CLINICAL DATA:  Ulcer along dorsum of third toe. EXAM: LEFT FOOT - COMPLETE 3+ VIEW COMPARISON:  None. FINDINGS: No fractures or dislocations. No soft tissue gas identified. The distal tuft of the third toe is not well evaluated but there is no definitive erosion in this region. The remainder of the third digit is well seen with no abnormality. IMPRESSION: The distal tuft of the third toe  is not well seen on this study. The remainder of the third toe is normal in appearance. Electronically Signed   By: Dorise Bullion III M.D   On: 12/12/2016 14:16    Time Spent in minutes  30   Lala Lund K M.D on 12/13/2016 at 11:12 AM  Between 7am to 7pm - Pager - [phone]  After 7pm go to [email or web address] - password [facility]  [facility] -  Office  [phone]

## 2016-12-14 ENCOUNTER — Encounter (HOSPITAL_COMMUNITY)
Admission: EM | Disposition: A | Discharge status: Home / Self Care | Payer: Self-pay | Source: Home / Self Care | Attending: Internal Medicine

## 2016-12-14 DIAGNOSIS — I70245 Atherosclerosis of native arteries of left leg with ulceration of other part of foot: Secondary | ICD-10-CM

## 2016-12-14 DIAGNOSIS — L97501 Non-pressure chronic ulcer of other part of unspecified foot limited to breakdown of skin: Secondary | ICD-10-CM

## 2016-12-14 HISTORY — PX: PERIPHERAL VASCULAR CATHETERIZATION: SHX172C

## 2016-12-14 LAB — BASIC METABOLIC PANEL
ANION GAP: 7 (ref 5–15)
BUN: 19 mg/dL (ref 6–20)
CALCIUM: 8.7 mg/dL — AB (ref 8.9–10.3)
CHLORIDE: 109 mmol/L (ref 101–111)
CO2: 22 mmol/L (ref 22–32)
CREATININE: 1.37 mg/dL — AB (ref 0.61–1.24)
GFR calc non Af Amer: 53 mL/min — ABNORMAL LOW (ref >60)
Glucose, Bld: 155 mg/dL — ABNORMAL HIGH (ref 65–99)
Potassium: 3.8 mmol/L (ref 3.5–5.1)
SODIUM: 138 mmol/L (ref 135–145)

## 2016-12-14 LAB — PROTIME-INR
INR: 1.08
Prothrombin Time: 14 seconds (ref 11.4–15.2)

## 2016-12-14 LAB — URINE CULTURE

## 2016-12-14 SURGERY — ABDOMINAL AORTOGRAM W/LOWER EXTREMITY
Laterality: Left

## 2016-12-14 MED ORDER — MIDAZOLAM HCL 2 MG/2ML IJ SOLN
INTRAMUSCULAR | Status: AC
  Filled 2016-12-14: qty 2

## 2016-12-14 MED ORDER — FENTANYL CITRATE (PF) 100 MCG/2ML IJ SOLN
INTRAMUSCULAR | Status: AC
  Filled 2016-12-14: qty 2

## 2016-12-14 MED ORDER — SODIUM CHLORIDE 0.9 % IV SOLN: 1.0000 mL/kg/h | INTRAVENOUS | Status: AC

## 2016-12-14 MED ORDER — LIDOCAINE HCL (PF) 1 % IJ SOLN
INTRAMUSCULAR | Status: AC
  Filled 2016-12-14: qty 30

## 2016-12-14 MED ORDER — FENTANYL CITRATE (PF) 100 MCG/2ML IJ SOLN
INTRAMUSCULAR | Status: DC | PRN
  Administered 2016-12-14: 50 ug via INTRAVENOUS

## 2016-12-14 MED ORDER — IODIXANOL 320 MG/ML IV SOLN
INTRAVENOUS | Status: DC | PRN
  Administered 2016-12-14: 65 mL via INTRA_ARTERIAL

## 2016-12-14 MED ORDER — SODIUM CHLORIDE 0.9 % IV SOLN
INTRAVENOUS | Status: DC | PRN
  Administered 2016-12-14: 77 mL/h via INTRAVENOUS

## 2016-12-14 MED ORDER — LIDOCAINE HCL (PF) 1 % IJ SOLN
INTRAMUSCULAR | Status: DC | PRN
  Administered 2016-12-14: 18 mL

## 2016-12-14 MED ORDER — HEPARIN (PORCINE) IN NACL 2-0.9 UNIT/ML-% IJ SOLN
INTRAMUSCULAR | Status: AC
  Filled 2016-12-14: qty 1000

## 2016-12-14 MED ORDER — MIDAZOLAM HCL 2 MG/2ML IJ SOLN
INTRAMUSCULAR | Status: DC | PRN
  Administered 2016-12-14: 1 mg via INTRAVENOUS

## 2016-12-14 MED ORDER — HEPARIN (PORCINE) IN NACL 2-0.9 UNIT/ML-% IJ SOLN
INTRAMUSCULAR | Status: DC | PRN
  Administered 2016-12-14: 1000 mL

## 2016-12-14 MED ORDER — DOXYCYCLINE HYCLATE 100 MG PO TABS
100.0000 mg | ORAL_TABLET | Freq: Two times a day (BID) | ORAL | Status: DC
  Administered 2016-12-15 – 2016-12-19 (×9): 100 mg via ORAL
  Filled 2016-12-14 (×9): qty 1

## 2016-12-14 SURGICAL SUPPLY — 12 items
CATH ANGIO 5F PIGTAIL 65CM (CATHETERS) ×1 IMPLANT
COVER PRB 48X5XTLSCP FOLD TPE (BAG) IMPLANT
COVER PROBE 5X48 (BAG) ×2
KIT MICROINTRODUCER STIFF 5F (SHEATH) ×1 IMPLANT
KIT PV (KITS) ×2 IMPLANT
SHEATH PINNACLE 5F 10CM (SHEATH) ×1 IMPLANT
STOPCOCK MORSE 400PSI 3WAY (MISCELLANEOUS) ×1 IMPLANT
SYRINGE MEDRAD AVANTA MACH 7 (SYRINGE) ×1 IMPLANT
TRANSDUCER W/STOPCOCK (MISCELLANEOUS) ×2 IMPLANT
TRAY PV CATH (CUSTOM PROCEDURE TRAY) ×2 IMPLANT
TUBING CIL FLEX 10 FLL-RA (TUBING) ×1 IMPLANT
WIRE HITORQ VERSACORE ST 145CM (WIRE) ×1 IMPLANT

## 2016-12-14 NOTE — Progress Notes (Signed)
   VASCULAR SURGERY POSTOP NOTE:  Stable post op. Off pressors  Good urine output.  Good doppler flow both feet.   SUBJECTIVE: Pain well controlled.   PHYSICAL EXAM: Vitals:   12/14/16 1430 12/14/16 1435 12/14/16 1440 12/14/16 1445  BP: (!) 148/87 (!) 151/81 (!) 149/84 140/85  Pulse: 73 69 69 71  Resp: 14 11 16 18   Temp:      TempSrc:      SpO2: 96% 97% 95% 96%  Weight:       Good doppler flow both feet.   LABS: Lab Results  Component Value Date   WBC 9.7 12/13/2016   HGB 12.9 (L) 12/13/2016   HCT 39.0 12/13/2016   MCV 92.6 12/13/2016   PLT 240 12/13/2016   Lab Results  Component Value Date   CREATININE 1.37 (H) 12/14/2016   Lab Results  Component Value Date   INR 1.08 12/14/2016    Principal Problem:   Foot ulcer, left ([place]) Active Problems:   Essential hypertension   Tobacco abuse   Hyperlipidemia   S/P unilateral BKA (below knee amputation), right ([facility])   Neuropathy ([facility])   Vascular disease, peripheral ([place])   Foot ulcer ([place])  Gae Gallop [id number] 12/14/2016

## 2016-12-14 NOTE — Interval H&P Note (Signed)
History and Physical Interval Note:  12/14/2016 1:07 PM  Jordan Arellano  has presented today for surgery, with the diagnosis of left 3rd toe ulcer  The various methods of treatment have been discussed with the patient and family. After consideration of risks, benefits and other options for treatment, the patient has consented to  Procedure(s): Abdominal Aortogram w/Lower Extremity (N/A) as a surgical intervention .  The patient's history has been reviewed, patient examined, no change in status, stable for surgery.  I have reviewed the patient's chart and labs.  Questions were answered to the patient's satisfaction.     [name]

## 2016-12-14 NOTE — H&P (View-Only) (Signed)
[facility]ist of [facility]  Patient name: Jordan Arellano MRN: [id number] DOB: Oct 01, 1951 Sex: male  REASON FOR CONSULT: toe ulcer, consult is from Dr. Tana Coast with [facility]  HPI: Jordan Arellano is a 66 y.o. male, who presents with 4 weeks history of left 3rd toe ulceration and erythema. The patient states that he developed a blister on his toe after wearing a new pair of shoes. This blister progressed and the patient presented to the hospital due to encouragement from his niece. He denies any drainage, fever or chills. He denies any pain to his toe. 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 He denies any other non-healing wounds, claudication and rest pain. He ambulates with a prosthesis. He lives at home with his niece.   He smokes 1/2 pack per day. He is not diabetic. He is on a statin for hyperlipidemia and on an ARB and HCTZ for hyperlipidemia. He has a prior history of CVA. Denies any history of CAD.   He is unaware of any kidney issues.   Past Medical History:  Diagnosis Date  . Depression   . Hyperlipidemia   . Hypertension   . PAD (peripheral artery disease) ([place]) ~2007   s/p R BKA for non-healing wound  . Stroke ([place]) 03/2014   MRI: Acute nonhemorrhagic left paracentral pontine infarct. 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  Marland Kitchen TIA (transient ischemic attack) 01/2014    Family History  Problem Relation Age of Onset  . Hypertension Mother     Does not know history  . Heart disease Mother   . Stroke Mother   . Diabetes Mother   . Hypertension Father   . Heart disease Father   . Stroke Father   . Diabetes Sister   . Hypertension Sister   . Heart disease Brother   . Hypertension Brother     SOCIAL HISTORY: Social History   Social History  . Marital status: Single    Spouse name: N/A  . Number of children: N/A   . Years of education: N/A   Occupational History  . Not on file.   Social History Main Topics  . Smoking status: Current Some Day Smoker    Packs/day: 0.50    Years: 35.00    Types: Cigarettes  . Smokeless tobacco: Never Used  . Alcohol use No  . Drug use: No  . Sexual activity: Not on file   Other Topics Concern  . Not on file   Social History Narrative  . No narrative on file    No Known Allergies  Current Facility-Administered Medications  Medication Dose Route Frequency Provider Last Rate Last Dose  . 0.9 %  sodium chloride infusion   Intravenous Continuous Ripudeep Krystal Eaton, MD 100 mL/hr at 12/13/16 0103    . acetaminophen (TYLENOL) tablet 650 mg  650 mg Oral Q6H PRN Ripudeep Krystal Eaton, MD       Or  . acetaminophen (TYLENOL) suppository 650 mg  650 mg Rectal Q6H PRN Ripudeep Krystal Eaton, MD      . aspirin EC tablet 81 mg  81 mg Oral Daily Ripudeep Krystal Eaton, MD   81 mg at 12/12/16 2013  . atorvastatin (LIPITOR) tablet 20 mg  20 mg Oral q1800 Ripudeep Krystal Eaton, MD   20 mg at 12/12/16 2013  . buPROPion [facility] SR) 12  hr tablet 150 mg  150 mg Oral Daily Ripudeep Krystal Eaton, MD   150 mg at 12/12/16 2013  . DULoxetine (CYMBALTA) DR capsule 60 mg  60 mg Oral BID Ripudeep Krystal Eaton, MD   60 mg at 12/12/16 2200  . fenofibrate tablet 160 mg  160 mg Oral Daily Ripudeep Krystal Eaton, MD   160 mg at 12/12/16 2046  . gabapentin (NEURONTIN) tablet 600 mg  600 mg Oral QHS Ripudeep K Rai, MD   600 mg at 12/12/16 2200  . heparin injection 5,000 Units  5,000 Units Subcutaneous Q8H Ripudeep Krystal Eaton, MD   5,000 Units at 12/13/16 0529  . HYDROcodone-acetaminophen (NORCO/VICODIN) 5-325 MG per tablet 1-2 tablet  1-2 tablet Oral Q4H PRN Ripudeep K Rai, MD      . nicotine (NICODERM CQ - dosed in mg/24 hours) patch 21 mg  21 mg Transdermal Daily Ripudeep Krystal Eaton, MD   21 mg at 12/12/16 2012  . omega-3 acid ethyl esters (LOVAZA) capsule 2 g  2 g Oral BID Ripudeep K Rai, MD   2 g at 12/12/16 2200  . ondansetron (ZOFRAN) tablet 4 mg   4 mg Oral Q6H PRN Ripudeep K Rai, MD       Or  . ondansetron (ZOFRAN) injection 4 mg  4 mg Intravenous Q6H PRN Ripudeep K Rai, MD      . piperacillin-tazobactam (ZOSYN) IVPB 3.375 g  3.375 g Intravenous Q8H Rachel L Rumbarger, RPH   3.375 g at 12/13/16 0529  . sertraline (ZOLOFT) tablet 100 mg  100 mg Oral Daily Ripudeep Krystal Eaton, MD   100 mg at 12/12/16 2013  . sodium chloride flush (NS) 0.9 % injection 3 mL  3 mL Intravenous Q12H Ripudeep K Rai, MD      . vancomycin (VANCOCIN) 1,250 mg in sodium chloride 0.9 % 250 mL IVPB  1,250 mg Intravenous Q24H Rachel L Rumbarger, RPH        REVIEW OF SYSTEMS:  [X]  denotes positive finding, [ ]  denotes negative finding Cardiac  Comments:  Chest pain or chest pressure:    Shortness of breath upon exertion:    Short of breath when lying flat:    Irregular heart rhythm:        Vascular    Pain in calf, thigh, or hip brought on by ambulation:    Pain in feet at night that wakes you up from your sleep:     Blood clot in your veins:    Leg swelling:         Pulmonary    Oxygen at home:    Productive cough:     Wheezing:         Neurologic    Sudden weakness in arms or legs:     Sudden numbness in arms or legs:     Sudden onset of difficulty speaking or slurred speech:    Temporary loss of vision in one eye:     Problems with dizziness:         Gastrointestinal    Blood in stool:     Vomited blood:         Genitourinary    Burning when urinating:     Blood in urine:        Psychiatric    Major depression:         Hematologic    Bleeding problems:    Problems with blood clotting too easily:        Skin  Rashes or ulcers: x       Constitutional    Fever or chills:      PHYSICAL EXAM: Vitals:   12/12/16 1600 12/12/16 1700 12/12/16 1937 12/13/16 0535  BP: 114/75 150/66 134/81 130/71  Pulse: 102 80 81 73  Resp: 20 12 15 17   Temp:   97.9 F (36.6 C) 98.2 F (36.8 C)  TempSrc:   Oral Oral  SpO2: 96% 98% 97% 99%  Weight:    169 lb 8.5 oz (76.9 kg)     GENERAL: The patient is a well-nourished male, in no acute distress. The vital signs are documented above. CARDIAC: There is a regular rate and rhythm. No carotid bruits.  VASCULAR: 2+ radial pulses bilaterally. 1-2+ left femoral pulse. Nonpalpable right femoral pulse. Non palpable left popliteal and pedal pulses. Both doppler boxes on the floor are not functioning. I have placed one on the charger. Left 3rd toe erythematous with ulceration to dorsal aspect. No drainage or purulence. No gangrene PULMONARY: There is good air exchange bilaterally without wheezing or rales. ABDOMEN: Soft and non-tender with normal pitched bowel sounds.  MUSCULOSKELETAL: Right BKA NEUROLOGIC: Sensation and motor function intact left foot. No focal deficits.  SKIN: See vascular section above.  PSYCHIATRIC: The patient has a normal affect.  MEDICAL ISSUES: Left 3rd toe ulceration and cellulitis AKI  The patient will need an arteriogram to evaluate blood flow to his left lower extremity once his renal function is back to baseline. Continue hydration and antibiotics. Dr. Donnetta Hutching to see patient.    Virgina Jock, PA-C [facility] [place]

## 2016-12-14 NOTE — Progress Notes (Signed)
Site area:LFA  Site Prior to Removal:  Level 0 Pressure Applied For:20 min Manual:   yes Patient Status During Pull:stable   Post Pull Site:  Level 0 Post Pull Instructions Given:  yes Post Pull Pulses Present: weakly palpable Dressing Applied: tegaderm  Bedrest begins @ [id number] till 1845 Comments:

## 2016-12-14 NOTE — Progress Notes (Signed)
PROGRESS NOTE                                                                                                                                                                                                             Patient Demographics:    Jordan Arellano, is a 66 y.o. male, DOB - [birth year], [id number]  Admit date - 12/12/2016   Admitting Physician Jordan Krystal Eaton, MD  Outpatient Primary MD for the patient is Jordan Lacy, NP  LOS - 2  Chief Complaint  Patient presents with  . Toe Pain       Brief Narrative Patient is a 66 year old male with history of CVA, PAD, hypertension, hyperlipidemia, ongoing smoking, right leg BKA presented to ED with nonhealing left third toe ulcer.   Subjective:    [name] today has, No headache, No chest pain, No abdominal pain - No Nausea, No new weakness tingling or numbness, No Cough - SOB.     Assessment  & Plan :     1.Left third toe ulcer with history of PAD and signs of chronic ischemia in the left leg. Mild surrounding cellulitis, for now IV antibiotics to continue for another 24-48 hours, follow cultures thereafter transitioned to oral doxycycline, ABI shows severe PAD in the left leg with ABI of 0.23, vascular surgery following likely will require arteriogram once creatinine is improved. MRI does not show osteomyelitis.  2. PAD with history of right BKA. Ongoing smoking. Ulcer to quit smoking, continue aspirin and statin for secondary prevention. Left leg ABI 0.23.  3. ARF. Baseline creatinine 1.2. ARB and HCTZ held, due to function close to baseline now after gentle hydration. Pharmacy monitoring vancomycin.  4. Smoking. Counseled to quit, on neck and him patch.  5. Dyslipidemia. On statin.  6. Past right BKA, has prosthesis, continue supportive care.  7. Essential hypertension. Currently stable will monitor on as needed IV hydralazine.   Family Communication   :  None  Code Status :  Full  Diet : Diet NPO time specified Except for: Sips with Meds   Disposition Plan  :  Stay inpt  Consults  :  VVS  Procedures  :    ABI 0.23 left leg. Right-sided BKA.   DVT Prophylaxis  :  Heparin   Lab Results  Component Value Date   PLT 240  12/13/2016    Inpatient Medications  Scheduled Meds: . aspirin EC  81 mg Oral Daily  . atorvastatin  20 mg Oral q1800  . buPROPion  150 mg Oral Daily  . DULoxetine  60 mg Oral BID  . fenofibrate  160 mg Oral Daily  . gabapentin  600 mg Oral QHS  . heparin  5,000 Units Subcutaneous Q8H  . nicotine  21 mg Transdermal Daily  . omega-3 acid ethyl esters  2 g Oral BID  . piperacillin-tazobactam (ZOSYN)  IV  3.375 g Intravenous Q8H  . sertraline  100 mg Oral Daily  . sodium chloride flush  3 mL Intravenous Q12H  . vancomycin  1,250 mg Intravenous Q24H   Continuous Infusions: . sodium chloride 75 mL/hr at 12/14/16 0446   PRN Meds:.acetaminophen **OR** acetaminophen, hydrALAZINE, HYDROcodone-acetaminophen, ondansetron **OR** ondansetron (ZOFRAN) IV  Antibiotics  :    Anti-infectives    Start     Dose/Rate Route Frequency Ordered Stop   12/13/16 1200  vancomycin (VANCOCIN) 1,250 mg in sodium chloride 0.9 % 250 mL IVPB     1,250 mg 166.7 mL/hr over 90 Minutes Intravenous Every 24 hours 12/12/16 1946     12/12/16 2100  piperacillin-tazobactam (ZOSYN) IVPB 3.375 g     3.375 g 12.5 mL/hr over 240 Minutes Intravenous Every 8 hours 12/12/16 1946     12/12/16 1945  vancomycin (VANCOCIN) IVPB 1000 mg/200 mL premix  Status:  Discontinued     1,000 mg 200 mL/hr over 60 Minutes Intravenous  Once 12/12/16 1940 12/12/16 1943   12/12/16 1415  vancomycin (VANCOCIN) IVPB 1000 mg/200 mL premix     1,000 mg 200 mL/hr over 60 Minutes Intravenous  Once 12/12/16 1413 12/12/16 1622         Objective:   Vitals:   12/13/16 0535 12/13/16 1406 12/13/16 2018 12/14/16 0445  BP: 130/71 (!) 144/79 108/71 119/75  Pulse: 73  78 82 68  Resp: 17 16 19 16   Temp: 98.2 F (36.8 C) 98 F (36.7 C) 98.2 F (36.8 C) 97.8 F (36.6 C)  TempSrc: Oral Oral Oral Oral  SpO2: 99% 100% 100% 99%  Weight:        Wt Readings from Last 3 Encounters:  12/12/16 76.9 kg (169 lb 8.5 oz)  11/03/16 78.9 kg (174 lb)  10/11/16 79.4 kg (175 lb)     Intake/Output Summary (Last 24 hours) at 12/14/16 1127 Last data filed at 12/14/16 0915  Gross per 24 hour  Intake          1836.25 ml  Output             1125 ml  Net           711.25 ml     Physical Exam  Awake Alert, Oriented X 3, No new F.N deficits, Normal affect [place].AT,PERRAL Supple Neck,No JVD, No cervical lymphadenopathy appriciated.  Symmetrical Chest wall movement, Good air movement bilaterally, CTAB RRR,No Gallops,Rubs or new Murmurs, No Parasternal Heave +ve B.Sounds, Abd Soft, No tenderness, No organomegaly appriciated, No rebound - guarding or rigidity. No Cyanosis, Clubbing or edema, No new Rash or bruise, L. lower extremity extremely poor pulsations, signs of chronic ischemia with head loss, 3rd toe has an ulcer on the dorsal aspect of about 4 mm in diameter with mild surrounding cellulitis, right BKA    Data Review:    CBC  Recent Labs Lab 12/12/16 1425 12/13/16 0510  WBC 11.3* 9.7  HGB 15.6 12.9*  HCT 45.1  39.0  PLT 295 240  MCV 90.7 92.6  MCH 31.4 30.6  MCHC 34.6 33.1  RDW 14.5 15.3  LYMPHSABS 2.9  --   MONOABS 0.6  --   EOSABS 0.2  --   BASOSABS 0.1  --     Chemistries   Recent Labs Lab 12/12/16 1425 12/13/16 0510 12/14/16 0515  NA 137 140 138  K 4.0 4.2 3.8  CL 106 109 109  CO2 18* 24 22  GLUCOSE 255* 171* 155*  BUN 35* 29* 19  CREATININE 2.05* 1.57* 1.37*  CALCIUM 9.7 8.7* 8.7*  AST 23  --   --   ALT 19  --   --   ALKPHOS 48  --   --   BILITOT 0.7  --   --    ------------------------------------------------------------------------------------------------------------------ No results for input(s): CHOL, HDL, LDLCALC,  TRIG, CHOLHDL, LDLDIRECT in the last 72 hours.  Lab Results  Component Value Date   HGBA1C 6.2 (H) 03/26/2014   ------------------------------------------------------------------------------------------------------------------ No results for input(s): TSH, T4TOTAL, T3FREE, THYROIDAB in the last 72 hours.  Invalid input(s): FREET3 ------------------------------------------------------------------------------------------------------------------ No results for input(s): VITAMINB12, FOLATE, FERRITIN, TIBC, IRON, RETICCTPCT in the last 72 hours.  Coagulation profile  Recent Labs Lab 12/14/16 0515  INR 1.08    No results for input(s): DDIMER in the last 72 hours.  Cardiac Enzymes No results for input(s): CKMB, TROPONINI, MYOGLOBIN in the last 168 hours.  Invalid input(s): CK ------------------------------------------------------------------------------------------------------------------ No results found for: BNP  Micro Results Recent Results (from the past 240 hour(s))  Culture, blood (Routine X 2) w Reflex to ID Panel     Status: None (Preliminary result)   Collection Time: 12/12/16  8:10 PM  Result Value Ref Range Status   Specimen Description BLOOD LEFT ANTECUBITAL  Final   Special Requests BOTTLES DRAWN AEROBIC AND ANAEROBIC 10CC EA  Final   Culture NO GROWTH < 24 HOURS  Final   Report Status PENDING  Incomplete  Culture, blood (Routine X 2) w Reflex to ID Panel     Status: None (Preliminary result)   Collection Time: 12/12/16  8:10 PM  Result Value Ref Range Status   Specimen Description BLOOD LEFT FOREARM  Final   Special Requests BOTTLES DRAWN AEROBIC AND ANAEROBIC 10CC EA  Final   Culture NO GROWTH < 24 HOURS  Final   Report Status PENDING  Incomplete  Urine culture     Status: Abnormal   Collection Time: 12/13/16  1:59 AM  Result Value Ref Range Status   Specimen Description URINE, RANDOM  Final   Special Requests NONE  Final   Culture MULTIPLE SPECIES PRESENT,  SUGGEST RECOLLECTION (A)  Final   Report Status 12/14/2016 FINAL  Final    Radiology Reports Mr Foot Left Wo Contrast  Result Date: 12/13/2016 CLINICAL DATA:  Nonhealing ulcer on the third toe of the left foot. EXAM: MRI OF THE LEFT FOOT WITHOUT CONTRAST TECHNIQUE: Multiplanar, multisequence MR imaging of the left forefoot was performed. No intravenous contrast was administered. COMPARISON:  Radiographs dated 12/12/2016 FINDINGS: Bones/Joint/Cartilage No osteomyelitis, bone edema, or other significant abnormalities. Ligaments Normal. Muscles and Tendons Normal. Soft tissues Normal. IMPRESSION: Normal MRI of the left forefoot. Specifically, no evidence of osteomyelitis or abscess of the third toe. Electronically Signed   By: Lorriane Shire M.D.   On: 12/13/2016 07:53   Dg Foot Complete Left  Result Date: 12/12/2016 CLINICAL DATA:  Ulcer along dorsum of third toe. EXAM: LEFT FOOT - COMPLETE 3+ VIEW COMPARISON:  None. FINDINGS: No fractures or dislocations. No soft tissue gas identified. The distal tuft of the third toe is not well evaluated but there is no definitive erosion in this region. The remainder of the third digit is well seen with no abnormality. IMPRESSION: The distal tuft of the third toe is not well seen on this study. The remainder of the third toe is normal in appearance. Electronically Signed   By: Dorise Bullion III M.D   On: 12/12/2016 14:16    Time Spent in minutes  30   Lala Lund K M.D on 12/14/2016 at 11:27 AM  Between 7am to 7pm - Pager - [phone]  After 7pm go to [email or web address] - password [facility]  [facility] -  Office  [phone]

## 2016-12-14 NOTE — Op Note (Signed)
   PATIENT: Jordan Arellano   MRN: [id number] DOB: 10/31/1951    DATE OF PROCEDURE: 12/14/2016  INDICATIONS: Jordan Arellano is a 66 y.o. male with a nonhealing wound on his left foot. He presents for arteriography.  PROCEDURE:  1. Ultrasound-guided access to the left common femoral artery 2. Aortogram with left lower extremity runoff  SURGEON: Judeth Cornfield. Scot Dock, MD, FACS  ANESTHESIA: Local with sedation   EBL: Minimal  TECHNIQUE: The patient was brought to the peripheral vascular lab. He was sedated. The period of conscious sedation was 40 minutes.  During that time period, I was present face-to-face 100% of the time.  The patient was administered (1 mg of Versed and 50 g of fentanyl.). The patient's heart rate, blood pressure, and oxygen saturation were monitored by the nurse continuously during the procedure.  Both groins were prepped and draped in the usual sterile fashion. I could not palpate a right femoral pulse. He did have a palpable left femoral pulse although slightly diminished. I looked at the right femoral artery with the ultrasound and could not identify the artery. The patient has a BKA on the right. I therefore elected to cannulate the left side. Under ultrasound guidance, after the skin was anesthetized, the left common femoral artery was cannulated with a micropuncture needle and a micropuncture sheath introduced over a wire. This was exchanged for a 5 French sheath over a versa core wire. A pigtail catheter was positioned at the L1 vertebral body and flush aortogram obtained. The catheter was then removed over a wire and a retrograde left femoral arteriogram obtained in an RAO projection in order to evaluate the left common iliac artery and left external iliac artery. Next a left lower extremity runoff film was obtained.  At the completion of the procedure, the patient was transferred to the holding area for removal of the sheath. No immediate competitions were noted. Total  contrast was 65 cc.  FINDINGS:   1. 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 2. The infrarenal aorta is widely patent. 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 3. The right common iliac artery is patent. I did not evaluate the right lower extremity be on that because he had an elevated creatinine with mild renal insufficiency. 4. On the left side, which is the side of concern, the common iliac, hypogastric, and external iliac arteries are patent. There is mild disease in the left common femoral artery. The deep femoral artery is patent. The superficial femoral artery is occluded at its origin with reconstitution of the popliteal artery below the knee. There is two-vessel runoff on the left via the peroneal artery and posterior tibial artery. The anterior tibial artery is occluded.  [name], MD, FACS [facility]  DATE OF DICTATION:   12/14/2016

## 2016-12-15 ENCOUNTER — Encounter (HOSPITAL_COMMUNITY): Payer: Self-pay | Admitting: Vascular Surgery

## 2016-12-15 DIAGNOSIS — L97521 Non-pressure chronic ulcer of other part of left foot limited to breakdown of skin: Secondary | ICD-10-CM

## 2016-12-15 DIAGNOSIS — I10 Essential (primary) hypertension: Secondary | ICD-10-CM

## 2016-12-15 LAB — CBC
HCT: 34.4 % — ABNORMAL LOW (ref 39.0–52.0)
Hemoglobin: 11.4 g/dL — ABNORMAL LOW (ref 13.0–17.0)
MCH: 30.1 pg (ref 26.0–34.0)
MCHC: 33.1 g/dL (ref 30.0–36.0)
MCV: 90.8 fL (ref 78.0–100.0)
Platelets: 260 10*3/uL (ref 150–400)
RBC: 3.79 MIL/uL — ABNORMAL LOW (ref 4.22–5.81)
RDW: 14.4 % (ref 11.5–15.5)
WBC: 8.9 10*3/uL (ref 4.0–10.5)

## 2016-12-15 LAB — BASIC METABOLIC PANEL
Anion gap: 5 (ref 5–15)
BUN: 13 mg/dL (ref 6–20)
CHLORIDE: 107 mmol/L (ref 101–111)
CO2: 25 mmol/L (ref 22–32)
CREATININE: 1.2 mg/dL (ref 0.61–1.24)
Calcium: 8.3 mg/dL — ABNORMAL LOW (ref 8.9–10.3)
GFR calc non Af Amer: >60 mL/min (ref >60)
Glucose, Bld: 169 mg/dL — ABNORMAL HIGH (ref 65–99)
Potassium: 3.5 mmol/L (ref 3.5–5.1)
SODIUM: 137 mmol/L (ref 135–145)

## 2016-12-15 LAB — HEMOGLOBIN A1C
Hgb A1c MFr Bld: 8.3 % — ABNORMAL HIGH (ref 4.8–5.6)
Mean Plasma Glucose: 192 mg/dL

## 2016-12-15 NOTE — Progress Notes (Addendum)
Progress Note    12/15/2016 7:42 AM 1 Day Post-Op  Subjective:  No complaints  Afebrile HR 60's-70's  [id number] systolic [id number] RA  Vitals:   12/15/16 0205 12/15/16 0528  BP: 109/63 126/71  Pulse: 61 63  Resp: 18 18  Temp: 97.7 F (36.5 C) 97.8 F (36.6 C)    Physical Exam: Lungs:  Non labored Incisions:  Left groin is soft without hematoma Extremities:  Dry ulceration left 3rd toe   CBC    Component Value Date/Time   WBC 8.9 12/15/2016 0342   RBC 3.79 (L) 12/15/2016 0342   HGB 11.4 (L) 12/15/2016 0342   HCT 34.4 (L) 12/15/2016 0342   PLT 260 12/15/2016 0342   MCV 90.8 12/15/2016 0342   MCH 30.1 12/15/2016 0342   MCHC 33.1 12/15/2016 0342   RDW 14.4 12/15/2016 0342   LYMPHSABS 2.9 12/12/2016 1425   MONOABS 0.6 12/12/2016 1425   EOSABS 0.2 12/12/2016 1425   BASOSABS 0.1 12/12/2016 1425    BMET    Component Value Date/Time   NA 137 12/15/2016 0342   K 3.5 12/15/2016 0342   CL 107 12/15/2016 0342   CO2 25 12/15/2016 0342   GLUCOSE 169 (H) 12/15/2016 0342   BUN 13 12/15/2016 0342   CREATININE 1.20 12/15/2016 0342   CALCIUM 8.3 (L) 12/15/2016 0342   GFRNONAA >60 12/15/2016 0342   GFRAA >60 12/15/2016 0342    INR    Component Value Date/Time   INR 1.08 12/14/2016 0515     Intake/Output Summary (Last 24 hours) at 12/15/16 0742 Last data filed at 12/15/16 0600  Gross per 24 hour  Intake           2245.1 ml  Output             1350 ml  Net            895.1 ml     Assessment:  66 y.o. male is s/p:  1. Ultrasound-guided access to the left common femoral artery 2. Aortogram with left lower extremity runoff  1 Day Post-Op  Plan: -pt doing well this am-left groin without hematoma -neither doppler on the floor is working despite being charged -procedural findings as follows:   1. 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 2. The infrarenal aorta is widely patent. There is a small saccular  aneurysm along the right lateral wall of the infrarenal aorta. 3. The right common iliac artery is patent. I did not evaluate the right lower extremity be on that because he had an elevated creatinine with mild renal insufficiency. 4. On the left side, which is the side of concern, the common iliac, hypogastric, and external iliac arteries are patent. There is mild disease in the left common femoral artery. The deep femoral artery is patent. The superficial femoral artery is occluded at its origin with reconstitution of the popliteal artery below the knee. There is two-vessel runoff on the left via the peroneal artery and posterior tibial artery. The anterior tibial artery is occluded.   Leontine Locket, PA-C [facility] [phone] 12/15/2016 7:42 AM  I have examined the patient, reviewed and agree with above. Discussed need for revascularization for limb salvage. Fortunately he has had excellent result with antibiotic treatment and elevation. Much less surrounding erythema over the ulceration over the dorsum of his toe. Plan left femoral to popliteal bypass. Will obtain vein map and noninvasive lab determine only of the saphenous  vein. No time on the OR until Friday. Scheduled for left femoral to popliteal bypass at that time.  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 Creatinine runs in the 1.5 range. Would certainly consider elective treatment of this with angioplasty in the future. 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  Curt Jews, MD 12/15/2016 8:11 AM

## 2016-12-15 NOTE — Progress Notes (Signed)
PROGRESS NOTE                                                                                                                                                                                                             Patient Demographics:    Jordan Arellano, is a 66 y.o. male, DOB - 30-Sep-1951, [id number]  Admit date - 12/12/2016   Admitting Physician Ripudeep Krystal Eaton, MD  Outpatient Primary MD for the patient is Wilfred Lacy, NP  LOS - 3  Chief Complaint  Patient presents with  . Toe Pain       Brief Narrative Patient is a 66 year old male with history of CVA, PAD, hypertension, hyperlipidemia, ongoing smoking, right leg BKA presented to ED with nonhealing left third toe ulcer.   Subjective:  Feels ok, no complaints   Assessment  & Plan :     PAD with Left third toe non healing  ulcer  -Mild surrounding cellulitis on IV antibiotics to continue till OR then transition to oral doxycycline,  -ABI shows severe PAD in the left leg with ABI of 0.23 -angiogram :superficial femoral artery is occluded at its origin with reconstitution of the popliteal artery below the knee. There is two-vessel runoff on the left via the peroneal artery and posterior tibial artery. The anterior tibial artery is occluded.-, vascular surgery following, plan for fem pop bypass on Friday  AKI - Baseline creatinine 1.2, admission creatinine was 2 -ARB and HCTZ on hold -improved close to baseline now after gentle hydration.  -Pharmacy monitoring vancomycin.  Smoking. Counseled to quit,  -nicotine patch.   Dyslipidemia.  -On statin.   s/p R BKA, has prosthesis, continue supportive care.   Essential hypertension. Currently stable will monitor on as needed IV hydralazine.  DVT proph: Hep SQ  Family Communication  :  None  Code Status :  Full  Diet : Diet Carb Modified Fluid consistency: Thin; Room service appropriate? Yes    Disposition Plan  :  Home pending Surgery  Consults  :  VVS  Procedures  :    ABI 0.23 left leg. Right-sided BKA.   Lab Results  Component Value Date   PLT 260 12/15/2016    Inpatient Medications  Scheduled Meds: . aspirin EC  81 mg Oral Daily  . atorvastatin  20 mg Oral q1800  . buPROPion  150 mg Oral Daily  . doxycycline  100 mg Oral Q12H  . DULoxetine  60 mg Oral BID  . fenofibrate  160 mg Oral Daily  . gabapentin  600 mg Oral QHS  . heparin  5,000 Units Subcutaneous Q8H  . nicotine  21 mg Transdermal Daily  . omega-3 acid ethyl esters  2 g Oral BID  . sertraline  100 mg Oral Daily  . sodium chloride flush  3 mL Intravenous Q12H   Continuous Infusions:  PRN Meds:.acetaminophen **OR** acetaminophen, hydrALAZINE, HYDROcodone-acetaminophen, ondansetron **OR** ondansetron (ZOFRAN) IV  Antibiotics  :    Anti-infectives    Start     Dose/Rate Route Frequency Ordered Stop   12/15/16 1000  doxycycline (VIBRA-TABS) tablet 100 mg     100 mg Oral Every 12 hours 12/14/16 1138     12/13/16 1200  vancomycin (VANCOCIN) 1,250 mg in sodium chloride 0.9 % 250 mL IVPB     1,250 mg 166.7 mL/hr over 90 Minutes Intravenous Every 24 hours 12/12/16 1946 12/14/16 1640   12/12/16 2100  piperacillin-tazobactam (ZOSYN) IVPB 3.375 g     3.375 g 12.5 mL/hr over 240 Minutes Intravenous Every 8 hours 12/12/16 1946 12/14/16 2359   12/12/16 1945  vancomycin (VANCOCIN) IVPB 1000 mg/200 mL premix  Status:  Discontinued     1,000 mg 200 mL/hr over 60 Minutes Intravenous  Once 12/12/16 1940 12/12/16 1943   12/12/16 1415  vancomycin (VANCOCIN) IVPB 1000 mg/200 mL premix     1,000 mg 200 mL/hr over 60 Minutes Intravenous  Once 12/12/16 1413 12/12/16 1622         Objective:   Vitals:   12/14/16 2101 12/15/16 0205 12/15/16 0528 12/15/16 1003  BP: 138/68 109/63 126/71 135/70  Pulse: 77 61 63 70  Resp: 18 18 18 18   Temp: 97.9 F (36.6 C) 97.7 F (36.5 C) 97.8 F (36.6 C) 97.8 F (36.6  C)  TempSrc: Oral Oral Oral Oral  SpO2: 95% 96% 95% 99%  Weight:        Wt Readings from Last 3 Encounters:  12/12/16 76.9 kg (169 lb 8.5 oz)  11/03/16 78.9 kg (174 lb)  10/11/16 79.4 kg (175 lb)     Intake/Output Summary (Last 24 hours) at 12/15/16 1331 Last data filed at 12/15/16 1004  Gross per 24 hour  Intake           2365.1 ml  Output             1250 ml  Net           1115.1 ml     Physical Exam  Awake Alert, Oriented X 3, No distress [place].AT,PERRAL Supple Neck,No JVD Lungs: CTAB CVS: RRR,No Gallops,Rubs Abd: +ve B.Sounds, Abd Soft, No tenderness, No rebound - guarding or rigidity. Ext: L. lower extremity extremely poor pulsations, signs of chronic ischemia with head loss, 3rd toe has an ulcer on the dorsal aspect of about 4 mm in diameter , right BKA    Data Review:    CBC  Recent Labs Lab 12/12/16 1425 12/13/16 0510 12/15/16 0342  WBC 11.3* 9.7 8.9  HGB 15.6 12.9* 11.4*  HCT 45.1 39.0 34.4*  PLT 295 240 260  MCV 90.7 92.6 90.8  MCH 31.4 30.6 30.1  MCHC 34.6 33.1 33.1  RDW 14.5 15.3 14.4  LYMPHSABS 2.9  --   --   MONOABS 0.6  --   --   EOSABS 0.2  --   --   BASOSABS  0.1  --   --     Chemistries   Recent Labs Lab 12/12/16 1425 12/13/16 0510 12/14/16 0515 12/15/16 0342  NA 137 140 138 137  K 4.0 4.2 3.8 3.5  CL 106 109 109 107  CO2 18* 24 22 25   GLUCOSE 255* 171* 155* 169*  BUN 35* 29* 19 13  CREATININE 2.05* 1.57* 1.37* 1.20  CALCIUM 9.7 8.7* 8.7* 8.3*  AST 23  --   --   --   ALT 19  --   --   --   ALKPHOS 48  --   --   --   BILITOT 0.7  --   --   --    ------------------------------------------------------------------------------------------------------------------ No results for input(s): CHOL, HDL, LDLCALC, TRIG, CHOLHDL, LDLDIRECT in the last 72 hours.  Lab Results  Component Value Date   HGBA1C 8.3 (H) 12/14/2016    ------------------------------------------------------------------------------------------------------------------ No results for input(s): TSH, T4TOTAL, T3FREE, THYROIDAB in the last 72 hours.  Invalid input(s): FREET3 ------------------------------------------------------------------------------------------------------------------ No results for input(s): VITAMINB12, FOLATE, FERRITIN, TIBC, IRON, RETICCTPCT in the last 72 hours.  Coagulation profile  Recent Labs Lab 12/14/16 0515  INR 1.08    No results for input(s): DDIMER in the last 72 hours.  Cardiac Enzymes No results for input(s): CKMB, TROPONINI, MYOGLOBIN in the last 168 hours.  Invalid input(s): CK ------------------------------------------------------------------------------------------------------------------ No results found for: BNP  Micro Results Recent Results (from the past 240 hour(s))  Culture, blood (Routine X 2) w Reflex to ID Panel     Status: None (Preliminary result)   Collection Time: 12/12/16  8:10 PM  Result Value Ref Range Status   Specimen Description BLOOD LEFT ANTECUBITAL  Final   Special Requests BOTTLES DRAWN AEROBIC AND ANAEROBIC 10CC EA  Final   Culture NO GROWTH 3 DAYS  Final   Report Status PENDING  Incomplete  Culture, blood (Routine X 2) w Reflex to ID Panel     Status: None (Preliminary result)   Collection Time: 12/12/16  8:10 PM  Result Value Ref Range Status   Specimen Description BLOOD LEFT FOREARM  Final   Special Requests BOTTLES DRAWN AEROBIC AND ANAEROBIC 10CC EA  Final   Culture NO GROWTH 3 DAYS  Final   Report Status PENDING  Incomplete  Urine culture     Status: Abnormal   Collection Time: 12/13/16  1:59 AM  Result Value Ref Range Status   Specimen Description URINE, RANDOM  Final   Special Requests NONE  Final   Culture MULTIPLE SPECIES PRESENT, SUGGEST RECOLLECTION (A)  Final   Report Status 12/14/2016 FINAL  Final    Radiology Reports Mr Foot Left Wo  Contrast  Result Date: 12/13/2016 CLINICAL DATA:  Nonhealing ulcer on the third toe of the left foot. EXAM: MRI OF THE LEFT FOOT WITHOUT CONTRAST TECHNIQUE: Multiplanar, multisequence MR imaging of the left forefoot was performed. No intravenous contrast was administered. COMPARISON:  Radiographs dated 12/12/2016 FINDINGS: Bones/Joint/Cartilage No osteomyelitis, bone edema, or other significant abnormalities. Ligaments Normal. Muscles and Tendons Normal. Soft tissues Normal. IMPRESSION: Normal MRI of the left forefoot. Specifically, no evidence of osteomyelitis or abscess of the third toe. Electronically Signed   By: Lorriane Shire M.D.   On: 12/13/2016 07:53   Dg Foot Complete Left  Result Date: 12/12/2016 CLINICAL DATA:  Ulcer along dorsum of third toe. EXAM: LEFT FOOT - COMPLETE 3+ VIEW COMPARISON:  None. FINDINGS: No fractures or dislocations. No soft tissue gas identified. The distal tuft of the third  toe is not well evaluated but there is no definitive erosion in this region. The remainder of the third digit is well seen with no abnormality. IMPRESSION: The distal tuft of the third toe is not well seen on this study. The remainder of the third toe is normal in appearance. Electronically Signed   By: Dorise Bullion III M.D   On: 12/12/2016 14:16    Time Spent in minutes  30   Cadi Rhinehart M.D on 12/15/2016 at 1:31 PM  Between 7am to 7pm - Pager - [phone]  After 7pm go to [email or web address] - password [facility]  [facility] -  Office  [phone]

## 2016-12-16 ENCOUNTER — Inpatient Hospital Stay (HOSPITAL_COMMUNITY): Payer: Medicare HMO

## 2016-12-16 DIAGNOSIS — Z01810 Encounter for preprocedural cardiovascular examination: Secondary | ICD-10-CM

## 2016-12-16 MED ORDER — CEFUROXIME SODIUM 1.5 G IJ SOLR
1.5000 g | INTRAMUSCULAR | Status: AC
  Administered 2016-12-17: 1.5 g via INTRAVENOUS
  Filled 2016-12-16: qty 1.5

## 2016-12-16 NOTE — Progress Notes (Addendum)
  [facility] Progress Note  Subjective    No complaints this am.   Objective Vitals:   12/15/16 2104 12/16/16 0620  BP: 139/74 124/75  Pulse: 71 68  Resp: 18 17  Temp: 98.2 F (36.8 C) 97.9 F (36.6 C)    Intake/Output Summary (Last 24 hours) at 12/16/16 0808 Last data filed at 12/16/16 0620  Gross per 24 hour  Intake              240 ml  Output             1400 ml  Net            -1160 ml   Diffuse papular rash on face. Says this is chronic and he will have breakouts intermittently.  Left 3rd toe with dry ulcer to dorsal aspect. Erythema of toe is improved.   Assessment/Planning: 65 y.o. male with left 3rd toe ulceration 2 Days Post-Op   Plan for left femoral to below knee popliteal bypass tomorrow with Dr. Donnetta Hutching.  GSV mapping pending.  NPO past midnight. Obtain consent.  Counseled on smoking cessation.   Alvia Grove 12/16/2016 8:08 AM --  Laboratory CBC    Component Value Date/Time   WBC 8.9 12/15/2016 0342   HGB 11.4 (L) 12/15/2016 0342   HCT 34.4 (L) 12/15/2016 0342   PLT 260 12/15/2016 0342    BMET    Component Value Date/Time   NA 137 12/15/2016 0342   K 3.5 12/15/2016 0342   CL 107 12/15/2016 0342   CO2 25 12/15/2016 0342   GLUCOSE 169 (H) 12/15/2016 0342   BUN 13 12/15/2016 0342   CREATININE 1.20 12/15/2016 0342   CALCIUM 8.3 (L) 12/15/2016 0342   GFRNONAA >60 12/15/2016 0342   GFRAA >60 12/15/2016 0342    COAG Lab Results  Component Value Date   INR 1.08 12/14/2016   INR 1.01 03/25/2014   No results found for: PTT  Antibiotics Anti-infectives    Start     Dose/Rate Route Frequency Ordered Stop   12/15/16 1000  doxycycline (VIBRA-TABS) tablet 100 mg     100 mg Oral Every 12 hours 12/14/16 1138     12/13/16 1200  vancomycin (VANCOCIN) 1,250 mg in sodium chloride 0.9 % 250 mL IVPB     1,250 mg 166.7 mL/hr over 90 Minutes Intravenous Every 24 hours 12/12/16 1946 12/14/16 1640   12/12/16 2100   piperacillin-tazobactam (ZOSYN) IVPB 3.375 g     3.375 g 12.5 mL/hr over 240 Minutes Intravenous Every 8 hours 12/12/16 1946 12/14/16 2359   12/12/16 1945  vancomycin (VANCOCIN) IVPB 1000 mg/200 mL premix  Status:  Discontinued     1,000 mg 200 mL/hr over 60 Minutes Intravenous  Once 12/12/16 1940 12/12/16 1943   12/12/16 1415  vancomycin (VANCOCIN) IVPB 1000 mg/200 mL premix     1,000 mg 200 mL/hr over 60 Minutes Intravenous  Once 12/12/16 1413 12/12/16 1622       Virgina Jock, PA-C [facility] Office: [phone] Pager: [phone] 12/16/2016 8:08 AM

## 2016-12-16 NOTE — Progress Notes (Signed)
PROGRESS NOTE                                                                                                                                                                                                             Patient Demographics:    Jordan Arellano, is a 66 y.o. male, DOB - 03/18/1951, [id number]  Admit date - 12/12/2016   Admitting Physician Ripudeep Krystal Eaton, MD  Outpatient Primary MD for the patient is Wilfred Lacy, NP  LOS - 4  Chief Complaint  Patient presents with  . Toe Pain       Brief Narrative Patient is a 66 year old male with history of CVA, PAD, hypertension, hyperlipidemia, ongoing smoking, right leg BKA presented to ED with nonhealing left third toe ulcer.   Subjective:  Feels ok, no complaints   Assessment  & Plan :    PAD with Left third toe non healing  ulcer  -Mild surrounding cellulitis on IV antibiotics to continue till OR then transition to oral doxycycline,  -ABI shows severe PAD in the left leg with ABI of 0.23 -angiogram :superficial femoral artery is occluded at its origin with reconstitution of the popliteal artery below the knee. There is two-vessel runoff on the left via the peroneal artery and posterior tibial artery. The anterior tibial artery is occluded.-, vascular surgery following, plan for fem pop bypass on Friday -remains stable  AKI - Baseline creatinine 1.2, admission creatinine was 2 -ARB and HCTZ on hold -improved close to baseline now after gentle hydration.  -Pharmacy monitoring vancomycin.  TObacco abuse -Counseled to quit,  -nicotine patch.   Dyslipidemia.  -On statin.   s/p R BKA, has prosthesis, continue supportive care.   Essential hypertension.  -Currently stable will monitor on as needed IV hydralazine.  DVT proph: Hep SQ  Family Communication  :  Daughter at bedside Code Status :  Full Disposition Plan  :  Home pending Surgery  Consults   :  VVS  Procedures  :   Angiogram: FINDINGS:  1. 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 2. The infrarenal aorta is widely patent. 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 3. The right common iliac artery is patent. I did not evaluate the right lower extremity be  on that because he had an elevated creatinine with mild renal insufficiency. 4. On the left side, which is the side of concern, the common iliac, hypogastric, and external iliac arteries are patent. There is mild disease in the left common femoral artery. The deep femoral artery is patent. The superficial femoral artery is occluded at its origin with reconstitution of the popliteal artery below the knee. There is two-vessel runoff on the left via the peroneal artery and posterior tibial artery. The anterior tibial artery is occluded   ABI 0.23 left leg. Right-sided BKA.   Lab Results  Component Value Date   PLT 260 12/15/2016    Inpatient Medications  Scheduled Meds: . aspirin EC  81 mg Oral Daily  . atorvastatin  20 mg Oral q1800  . buPROPion  150 mg Oral Daily  . [START ON 12/17/2016] cefUROXime (ZINACEF)  IV  1.5 g Intravenous On Call to OR  . doxycycline  100 mg Oral Q12H  . DULoxetine  60 mg Oral BID  . fenofibrate  160 mg Oral Daily  . gabapentin  600 mg Oral QHS  . heparin  5,000 Units Subcutaneous Q8H  . nicotine  21 mg Transdermal Daily  . omega-3 acid ethyl esters  2 g Oral BID  . sertraline  100 mg Oral Daily  . sodium chloride flush  3 mL Intravenous Q12H   Continuous Infusions:  PRN Meds:.acetaminophen **OR** acetaminophen, hydrALAZINE, HYDROcodone-acetaminophen, ondansetron **OR** ondansetron (ZOFRAN) IV  Antibiotics  :    Anti-infectives    Start     Dose/Rate Route Frequency Ordered Stop   12/17/16 1130  cefUROXime (ZINACEF) 1.5 g in dextrose 5 % 50 mL IVPB     1.5 g 100 mL/hr over 30 Minutes  Intravenous On call to O.R. 12/16/16 [id number] 12/18/16 0559   12/15/16 1000  doxycycline (VIBRA-TABS) tablet 100 mg     100 mg Oral Every 12 hours 12/14/16 1138     12/13/16 1200  vancomycin (VANCOCIN) 1,250 mg in sodium chloride 0.9 % 250 mL IVPB     1,250 mg 166.7 mL/hr over 90 Minutes Intravenous Every 24 hours 12/12/16 1946 12/14/16 1640   12/12/16 2100  piperacillin-tazobactam (ZOSYN) IVPB 3.375 g     3.375 g 12.5 mL/hr over 240 Minutes Intravenous Every 8 hours 12/12/16 1946 12/14/16 2359   12/12/16 1945  vancomycin (VANCOCIN) IVPB 1000 mg/200 mL premix  Status:  Discontinued     1,000 mg 200 mL/hr over 60 Minutes Intravenous  Once 12/12/16 1940 12/12/16 1943   12/12/16 1415  vancomycin (VANCOCIN) IVPB 1000 mg/200 mL premix     1,000 mg 200 mL/hr over 60 Minutes Intravenous  Once 12/12/16 1413 12/12/16 1622         Objective:   Vitals:   12/15/16 1440 12/15/16 2104 12/16/16 0620 12/16/16 1052  BP: 132/77 139/74 124/75 126/79  Pulse: 70 71 68 70  Resp: 18 18 17 18   Temp: 97.9 F (36.6 C) 98.2 F (36.8 C) 97.9 F (36.6 C) 97.5 F (36.4 C)  TempSrc: Oral Oral  Oral  SpO2: 99% 98% 99% 99%  Weight:        Wt Readings from Last 3 Encounters:  12/12/16 76.9 kg (169 lb 8.5 oz)  11/03/16 78.9 kg (174 lb)  10/11/16 79.4 kg (175 lb)     Intake/Output Summary (Last 24 hours) at 12/16/16 1402 Last data filed at 12/16/16 1053  Gross per 24 hour  Intake  250 ml  Output             1200 ml  Net             -950 ml     Physical Exam  Awake Alert, Oriented X 3, No distress [place].AT,PERRAL Supple Neck,No JVD Lungs: CTAB CVS: RRR,No Gallops,Rubs Abd: +ve B.Sounds, Abd Soft, No tenderness, No rebound - guarding or rigidity. Ext: L. lower extremity extremely poor pulsations, signs of chronic ischemia with head loss, 3rd toe has an ulcer on the dorsal aspect of about 4 mm in diameter , right BKA    Data Review:    CBC  Recent Labs Lab 12/12/16 1425  12/13/16 0510 12/15/16 0342  WBC 11.3* 9.7 8.9  HGB 15.6 12.9* 11.4*  HCT 45.1 39.0 34.4*  PLT 295 240 260  MCV 90.7 92.6 90.8  MCH 31.4 30.6 30.1  MCHC 34.6 33.1 33.1  RDW 14.5 15.3 14.4  LYMPHSABS 2.9  --   --   MONOABS 0.6  --   --   EOSABS 0.2  --   --   BASOSABS 0.1  --   --     Chemistries   Recent Labs Lab 12/12/16 1425 12/13/16 0510 12/14/16 0515 12/15/16 0342  NA 137 140 138 137  K 4.0 4.2 3.8 3.5  CL 106 109 109 107  CO2 18* 24 22 25   GLUCOSE 255* 171* 155* 169*  BUN 35* 29* 19 13  CREATININE 2.05* 1.57* 1.37* 1.20  CALCIUM 9.7 8.7* 8.7* 8.3*  AST 23  --   --   --   ALT 19  --   --   --   ALKPHOS 48  --   --   --   BILITOT 0.7  --   --   --    ------------------------------------------------------------------------------------------------------------------ No results for input(s): CHOL, HDL, LDLCALC, TRIG, CHOLHDL, LDLDIRECT in the last 72 hours.  Lab Results  Component Value Date   HGBA1C 8.3 (H) 12/14/2016   ------------------------------------------------------------------------------------------------------------------ No results for input(s): TSH, T4TOTAL, T3FREE, THYROIDAB in the last 72 hours.  Invalid input(s): FREET3 ------------------------------------------------------------------------------------------------------------------ No results for input(s): VITAMINB12, FOLATE, FERRITIN, TIBC, IRON, RETICCTPCT in the last 72 hours.  Coagulation profile  Recent Labs Lab 12/14/16 0515  INR 1.08    No results for input(s): DDIMER in the last 72 hours.  Cardiac Enzymes No results for input(s): CKMB, TROPONINI, MYOGLOBIN in the last 168 hours.  Invalid input(s): CK ------------------------------------------------------------------------------------------------------------------ No results found for: BNP  Micro Results Recent Results (from the past 240 hour(s))  Culture, blood (Routine X 2) w Reflex to ID Panel     Status: None  (Preliminary result)   Collection Time: 12/12/16  8:10 PM  Result Value Ref Range Status   Specimen Description BLOOD LEFT ANTECUBITAL  Final   Special Requests BOTTLES DRAWN AEROBIC AND ANAEROBIC 10CC EA  Final   Culture NO GROWTH 3 DAYS  Final   Report Status PENDING  Incomplete  Culture, blood (Routine X 2) w Reflex to ID Panel     Status: None (Preliminary result)   Collection Time: 12/12/16  8:10 PM  Result Value Ref Range Status   Specimen Description BLOOD LEFT FOREARM  Final   Special Requests BOTTLES DRAWN AEROBIC AND ANAEROBIC 10CC EA  Final   Culture NO GROWTH 3 DAYS  Final   Report Status PENDING  Incomplete  Urine culture     Status: Abnormal   Collection Time: 12/13/16  1:59 AM  Result  Value Ref Range Status   Specimen Description URINE, RANDOM  Final   Special Requests NONE  Final   Culture MULTIPLE SPECIES PRESENT, SUGGEST RECOLLECTION (A)  Final   Report Status 12/14/2016 FINAL  Final    Radiology Reports Mr Foot Left Wo Contrast  Result Date: 12/13/2016 CLINICAL DATA:  Nonhealing ulcer on the third toe of the left foot. EXAM: MRI OF THE LEFT FOOT WITHOUT CONTRAST TECHNIQUE: Multiplanar, multisequence MR imaging of the left forefoot was performed. No intravenous contrast was administered. COMPARISON:  Radiographs dated 12/12/2016 FINDINGS: Bones/Joint/Cartilage No osteomyelitis, bone edema, or other significant abnormalities. Ligaments Normal. Muscles and Tendons Normal. Soft tissues Normal. IMPRESSION: Normal MRI of the left forefoot. Specifically, no evidence of osteomyelitis or abscess of the third toe. Electronically Signed   By: Lorriane Shire M.D.   On: 12/13/2016 07:53   Dg Foot Complete Left  Result Date: 12/12/2016 CLINICAL DATA:  Ulcer along dorsum of third toe. EXAM: LEFT FOOT - COMPLETE 3+ VIEW COMPARISON:  None. FINDINGS: No fractures or dislocations. No soft tissue gas identified. The distal tuft of the third toe is not well evaluated but there is no  definitive erosion in this region. The remainder of the third digit is well seen with no abnormality. IMPRESSION: The distal tuft of the third toe is not well seen on this study. The remainder of the third toe is normal in appearance. Electronically Signed   By: Dorise Bullion III M.D   On: 12/12/2016 14:16    Time Spent in minutes  30   Khalea Ventura M.D on 12/16/2016 at 2:02 PM  Between 7am to 7pm - Pager - [phone]  After 7pm go to [email or web address] - password [facility]  [facility] -  Office  [phone]

## 2016-12-16 NOTE — Care Management Important Message (Signed)
Important Message  Patient Details  Name: Jordan Arellano MRN: [id number] Date of Birth: 04-Feb-1951   Medicare Important Message Given:  Yes    Yandiel Bergum [address] 12/16/2016, 12:40 PM

## 2016-12-16 NOTE — Progress Notes (Signed)
Right Lower Extremity Vein Map    Right Great Saphenous Vein   Segment Diameter Comment  1. Origin 2.56mm   2. High Thigh 1.27mm   3. Mid Thigh 1.13mm   4. Low Thigh 1.74mm   5. At Knee 1.29mm   6. High Calf mm AMPUTATION  7. Low Calf mm   8. Ankle mm    mm    mm    mm      Left Lower Extremity Vein Map    Left Great Saphenous Vein   Segment Diameter Comment  1. Origin 3.76mm   2. High Thigh 2.57mm   3. Mid Thigh 2.2mm   4. Low Thigh 2.69mm Thickening  5. At Knee 1.45mm Thickening  6. High Calf 1.55mm   7. Low Calf mm Thrombosis  8. Ankle 1.21mm Thickening   mm    mm    mm     Left Small Saphenous Vein  Segment Diameter Comment  1. Origin mm Thrombosed  2. High Calf mm Thrombosed  3. Low Calf mm Thrombosed  4. Ankle mm Thrombosed   mm    mm    mm    ;

## 2016-12-17 ENCOUNTER — Encounter (HOSPITAL_COMMUNITY)
Admission: EM | Disposition: A | Discharge status: Home / Self Care | Payer: Self-pay | Source: Home / Self Care | Attending: Internal Medicine

## 2016-12-17 ENCOUNTER — Inpatient Hospital Stay (HOSPITAL_COMMUNITY): Payer: Medicare HMO | Admitting: Certified Registered Nurse Anesthetist

## 2016-12-17 HISTORY — PX: FEMORAL-POPLITEAL BYPASS GRAFT: SHX937

## 2016-12-17 LAB — CBC
HEMATOCRIT: 36.8 % — AB (ref 39.0–52.0)
HEMOGLOBIN: 12.2 g/dL — AB (ref 13.0–17.0)
MCH: 30.2 pg (ref 26.0–34.0)
MCHC: 33.2 g/dL (ref 30.0–36.0)
MCV: 91.1 fL (ref 78.0–100.0)
PLATELETS: 262 10*3/uL (ref 150–400)
RBC: 4.04 MIL/uL — AB (ref 4.22–5.81)
RDW: 14.5 % (ref 11.5–15.5)
WBC: 10.3 10*3/uL (ref 4.0–10.5)

## 2016-12-17 LAB — BASIC METABOLIC PANEL
ANION GAP: 8 (ref 5–15)
BUN: 13 mg/dL (ref 6–20)
CHLORIDE: 106 mmol/L (ref 101–111)
CO2: 23 mmol/L (ref 22–32)
Calcium: 8.6 mg/dL — ABNORMAL LOW (ref 8.9–10.3)
Creatinine, Ser: 1.13 mg/dL (ref 0.61–1.24)
GFR calc non Af Amer: >60 mL/min (ref >60)
Glucose, Bld: 134 mg/dL — ABNORMAL HIGH (ref 65–99)
POTASSIUM: 3.3 mmol/L — AB (ref 3.5–5.1)
SODIUM: 137 mmol/L (ref 135–145)

## 2016-12-17 LAB — SURGICAL PCR SCREEN
MRSA, PCR: NEGATIVE
Staphylococcus aureus: NEGATIVE

## 2016-12-17 SURGERY — BYPASS GRAFT FEMORAL-POPLITEAL ARTERY
Anesthesia: General | Site: Leg Upper | Laterality: Left

## 2016-12-17 MED ORDER — HEPARIN SODIUM (PORCINE) 1000 UNIT/ML IJ SOLN
INTRAMUSCULAR | Status: DC | PRN
  Administered 2016-12-17: 8000 [IU] via INTRAVENOUS

## 2016-12-17 MED ORDER — MIDAZOLAM HCL 2 MG/2ML IJ SOLN
INTRAMUSCULAR | Status: AC
  Filled 2016-12-17: qty 2

## 2016-12-17 MED ORDER — LIDOCAINE 2% (20 MG/ML) 5 ML SYRINGE
INTRAMUSCULAR | Status: DC | PRN
  Administered 2016-12-17: 50 mg via INTRAVENOUS
  Administered 2016-12-17: 40 mg via INTRAVENOUS
  Administered 2016-12-17: 60 mg via INTRAVENOUS

## 2016-12-17 MED ORDER — PROPOFOL 10 MG/ML IV BOLUS
INTRAVENOUS | Status: AC
  Filled 2016-12-17: qty 20

## 2016-12-17 MED ORDER — 0.9 % SODIUM CHLORIDE (POUR BTL) OPTIME
TOPICAL | Status: DC | PRN
  Administered 2016-12-17: 2000 mL

## 2016-12-17 MED ORDER — PHENOL 1.4 % MT LIQD: 1.0000 | OROMUCOSAL | Status: DC | PRN

## 2016-12-17 MED ORDER — FENTANYL CITRATE (PF) 100 MCG/2ML IJ SOLN
INTRAMUSCULAR | Status: DC | PRN
  Administered 2016-12-17: 100 ug via INTRAVENOUS

## 2016-12-17 MED ORDER — ALUM & MAG HYDROXIDE-SIMETH 200-200-20 MG/5ML PO SUSP: 15.0000 mL | ORAL | Status: DC | PRN

## 2016-12-17 MED ORDER — MAGNESIUM SULFATE 2 GM/50ML IV SOLN
2.0000 g | Freq: Every day | INTRAVENOUS | Status: DC | PRN
  Filled 2016-12-17: qty 50

## 2016-12-17 MED ORDER — LIDOCAINE 2% (20 MG/ML) 5 ML SYRINGE
INTRAMUSCULAR | Status: AC
  Filled 2016-12-17: qty 5

## 2016-12-17 MED ORDER — OXYCODONE HCL 5 MG/5ML PO SOLN: 5.0000 mg | Freq: Once | ORAL | Status: DC | PRN

## 2016-12-17 MED ORDER — LABETALOL HCL 5 MG/ML IV SOLN
10.0000 mg | INTRAVENOUS | Status: DC | PRN
  Filled 2016-12-17: qty 4

## 2016-12-17 MED ORDER — FENTANYL CITRATE (PF) 100 MCG/2ML IJ SOLN
INTRAMUSCULAR | Status: AC
  Filled 2016-12-17: qty 4

## 2016-12-17 MED ORDER — PROPOFOL 10 MG/ML IV BOLUS
INTRAVENOUS | Status: DC | PRN
  Administered 2016-12-17: 120 mg via INTRAVENOUS

## 2016-12-17 MED ORDER — BISACODYL 10 MG RE SUPP: 10.0000 mg | Freq: Every day | RECTAL | Status: DC | PRN

## 2016-12-17 MED ORDER — MIDAZOLAM HCL 5 MG/5ML IJ SOLN
INTRAMUSCULAR | Status: DC | PRN
  Administered 2016-12-17: 2 mg via INTRAVENOUS

## 2016-12-17 MED ORDER — POLYETHYLENE GLYCOL 3350 17 G PO PACK: 17.0000 g | PACK | Freq: Every day | ORAL | Status: DC | PRN

## 2016-12-17 MED ORDER — HEPARIN SODIUM (PORCINE) 5000 UNIT/ML IJ SOLN
5000.0000 [IU] | Freq: Three times a day (TID) | INTRAMUSCULAR | Status: DC
  Administered 2016-12-18 – 2016-12-19 (×4): 5000 [IU] via SUBCUTANEOUS
  Filled 2016-12-17 (×4): qty 1

## 2016-12-17 MED ORDER — PANTOPRAZOLE SODIUM 40 MG PO TBEC
40.0000 mg | DELAYED_RELEASE_TABLET | Freq: Every day | ORAL | Status: DC
  Administered 2016-12-18 – 2016-12-19 (×2): 40 mg via ORAL
  Filled 2016-12-17 (×2): qty 1

## 2016-12-17 MED ORDER — MORPHINE SULFATE (PF) 2 MG/ML IV SOLN: 2.0000 mg | INTRAVENOUS | Status: DC | PRN

## 2016-12-17 MED ORDER — DOCUSATE SODIUM 100 MG PO CAPS
100.0000 mg | ORAL_CAPSULE | Freq: Every day | ORAL | Status: DC
  Administered 2016-12-18 – 2016-12-19 (×2): 100 mg via ORAL
  Filled 2016-12-17 (×2): qty 1

## 2016-12-17 MED ORDER — ROCURONIUM BROMIDE 50 MG/5ML IV SOSY
PREFILLED_SYRINGE | INTRAVENOUS | Status: AC
  Filled 2016-12-17: qty 5

## 2016-12-17 MED ORDER — ONDANSETRON HCL 4 MG/2ML IJ SOLN: 4.0000 mg | Freq: Once | INTRAMUSCULAR | Status: DC | PRN

## 2016-12-17 MED ORDER — PROTAMINE SULFATE 10 MG/ML IV SOLN
INTRAVENOUS | Status: DC | PRN
  Administered 2016-12-17: 10 mg via INTRAVENOUS
  Administered 2016-12-17 (×2): 20 mg via INTRAVENOUS

## 2016-12-17 MED ORDER — PHENYLEPHRINE HCL 10 MG/ML IJ SOLN
INTRAVENOUS | Status: DC | PRN
  Administered 2016-12-17: 50 ug/min via INTRAVENOUS

## 2016-12-17 MED ORDER — SODIUM CHLORIDE 0.9 % IV SOLN
INTRAVENOUS | Status: DC
  Administered 2016-12-17: 75 mL/h via INTRAVENOUS

## 2016-12-17 MED ORDER — DEXTROSE 5 % IV SOLN
1.5000 g | Freq: Two times a day (BID) | INTRAVENOUS | Status: AC
  Administered 2016-12-17 – 2016-12-18 (×2): 1.5 g via INTRAVENOUS
  Filled 2016-12-17 (×2): qty 1.5

## 2016-12-17 MED ORDER — METOPROLOL TARTRATE 5 MG/5ML IV SOLN: 2.0000 mg | INTRAVENOUS | Status: DC | PRN

## 2016-12-17 MED ORDER — ROCURONIUM BROMIDE 100 MG/10ML IV SOLN
INTRAVENOUS | Status: DC | PRN
  Administered 2016-12-17: 50 mg via INTRAVENOUS

## 2016-12-17 MED ORDER — GUAIFENESIN-DM 100-10 MG/5ML PO SYRP: 15.0000 mL | ORAL_SOLUTION | ORAL | Status: DC | PRN

## 2016-12-17 MED ORDER — FENTANYL CITRATE (PF) 100 MCG/2ML IJ SOLN: 25.0000 ug | INTRAMUSCULAR | Status: DC | PRN

## 2016-12-17 MED ORDER — LACTATED RINGERS IV SOLN
INTRAVENOUS | Status: DC | PRN
  Administered 2016-12-17 (×2): via INTRAVENOUS

## 2016-12-17 MED ORDER — OXYCODONE HCL 5 MG PO TABS: 5.0000 mg | ORAL_TABLET | Freq: Once | ORAL | Status: DC | PRN

## 2016-12-17 MED ORDER — SODIUM CHLORIDE 0.9 % IV SOLN
INTRAVENOUS | Status: DC | PRN
  Administered 2016-12-17: 12:00:00

## 2016-12-17 MED ORDER — IOPAMIDOL (ISOVUE-300) INJECTION 61%
INTRAVENOUS | Status: AC
  Filled 2016-12-17: qty 50

## 2016-12-17 MED ORDER — LACTATED RINGERS IV SOLN
INTRAVENOUS | Status: DC
Start: 2016-12-17 — End: 2016-12-17
  Administered 2016-12-17: 50 mL/h via INTRAVENOUS

## 2016-12-17 MED ORDER — SODIUM CHLORIDE 0.9 % IV SOLN: 500.0000 mL | Freq: Once | INTRAVENOUS | Status: DC | PRN

## 2016-12-17 MED ORDER — PROTAMINE SULFATE 10 MG/ML IV SOLN
INTRAVENOUS | Status: AC
  Filled 2016-12-17: qty 5

## 2016-12-17 MED ORDER — POTASSIUM CHLORIDE CRYS ER 20 MEQ PO TBCR: 20.0000 meq | EXTENDED_RELEASE_TABLET | Freq: Every day | ORAL | Status: DC | PRN

## 2016-12-17 MED ORDER — HEPARIN SODIUM (PORCINE) 1000 UNIT/ML IJ SOLN
INTRAMUSCULAR | Status: AC
  Filled 2016-12-17: qty 1

## 2016-12-17 SURGICAL SUPPLY — 48 items
ADH SKN CLS APL DERMABOND .7 (GAUZE/BANDAGES/DRESSINGS) ×2
BANDAGE ESMARK 6X9 LF (GAUZE/BANDAGES/DRESSINGS) IMPLANT
BNDG CMPR 9X6 STRL LF SNTH (GAUZE/BANDAGES/DRESSINGS) ×1
BNDG ESMARK 6X9 LF (GAUZE/BANDAGES/DRESSINGS) ×2
CANISTER SUCTION 2500CC (MISCELLANEOUS) ×2 IMPLANT
CANNULA VESSEL 3MM 2 BLNT TIP (CANNULA) ×4 IMPLANT
CLIP LIGATING EXTRA MED SLVR (CLIP) ×2 IMPLANT
CLIP LIGATING EXTRA SM BLUE (MISCELLANEOUS) ×2 IMPLANT
CUFF TOURNIQUET SINGLE 24IN (TOURNIQUET CUFF) ×1 IMPLANT
DERMABOND ADVANCED (GAUZE/BANDAGES/DRESSINGS) ×2
DERMABOND ADVANCED .7 DNX12 (GAUZE/BANDAGES/DRESSINGS) ×1 IMPLANT
DRAIN SNY 10X20 3/4 PERF (WOUND CARE) IMPLANT
DRAPE PROXIMA HALF (DRAPES) IMPLANT
DRAPE X-RAY CASS 24X20 (DRAPES) IMPLANT
ELECT REM PT RETURN 9FT ADLT (ELECTROSURGICAL) ×2
ELECTRODE REM PT RTRN 9FT ADLT (ELECTROSURGICAL) ×1 IMPLANT
EVACUATOR SILICONE 100CC (DRAIN) IMPLANT
GAUZE SPONGE 4X4 12PLY STRL (GAUZE/BANDAGES/DRESSINGS) ×2 IMPLANT
GLOVE BIOGEL PI IND STRL 6.5 (GLOVE) IMPLANT
GLOVE BIOGEL PI INDICATOR 6.5 (GLOVE) ×1
GLOVE INDICATOR 7.0 STRL GRN (GLOVE) ×2 IMPLANT
GLOVE SS BIOGEL STRL SZ 7.5 (GLOVE) ×1 IMPLANT
GLOVE SUPERSENSE BIOGEL SZ 7.5 (GLOVE) ×1
GOWN STRL REUS W/ TWL LRG LVL3 (GOWN DISPOSABLE) ×3 IMPLANT
GOWN STRL REUS W/TWL LRG LVL3 (GOWN DISPOSABLE) ×6
GRAFT PROPATEN W/RING 6X80X60 (Vascular Products) ×1 IMPLANT
INSERT FOGARTY SM (MISCELLANEOUS) ×1 IMPLANT
KIT BASIN OR (CUSTOM PROCEDURE TRAY) ×2 IMPLANT
KIT ROOM TURNOVER OR (KITS) ×2 IMPLANT
NS IRRIG 1000ML POUR BTL (IV SOLUTION) ×4 IMPLANT
PACK PERIPHERAL VASCULAR (CUSTOM PROCEDURE TRAY) ×2 IMPLANT
PAD ARMBOARD 7.5X6 YLW CONV (MISCELLANEOUS) ×4 IMPLANT
PADDING CAST COTTON 6X4 STRL (CAST SUPPLIES) ×1 IMPLANT
SET COLLECT BLD 21X3/4 12 (NEEDLE) IMPLANT
STAPLER VISISTAT 35W (STAPLE) IMPLANT
STOPCOCK 4 WAY LG BORE MALE ST (IV SETS) IMPLANT
SUT ETHILON 3 0 PS 1 (SUTURE) IMPLANT
SUT PROLENE 5 0 C 1 24 (SUTURE) ×2 IMPLANT
SUT PROLENE 6 0 CC (SUTURE) ×3 IMPLANT
SUT SILK 2 0 SH (SUTURE) ×2 IMPLANT
SUT VIC AB 2-0 CTX 36 (SUTURE) ×4 IMPLANT
SUT VIC AB 3-0 SH 27 (SUTURE) ×4
SUT VIC AB 3-0 SH 27X BRD (SUTURE) ×2 IMPLANT
SUT VICRYL 4-0 PS2 18IN ABS (SUTURE) ×1 IMPLANT
TRAY FOLEY W/METER SILVER 16FR (SET/KITS/TRAYS/PACK) ×2 IMPLANT
TUBING EXTENTION W/L.L. (IV SETS) IMPLANT
UNDERPAD 30X30 (UNDERPADS AND DIAPERS) ×2 IMPLANT
WATER STERILE IRR 1000ML POUR (IV SOLUTION) ×2 IMPLANT

## 2016-12-17 NOTE — Anesthesia Postprocedure Evaluation (Addendum)
Anesthesia Post Note  Patient: Babacar Bell  Procedure(s) Performed: Procedure(s) (LRB): BYPASS LEFT FEMORAL TO BELOW POPLITEAL ARTERY USING PROPATEN GORE GRAFT (Left)  Patient location during evaluation: PACU Anesthesia Type: General Level of consciousness: awake, awake and alert and oriented Pain management: pain level controlled Vital Signs Assessment: post-procedure vital signs reviewed and stable Respiratory status: spontaneous breathing, nonlabored ventilation and respiratory function stable Cardiovascular status: blood pressure returned to baseline Anesthetic complications: no       Last Vitals:  Vitals:   12/17/16 1902 12/17/16 1956  BP: 126/76 (!) 148/74  Pulse: 71 74  Resp: 17 16  Temp: 36.7 C 36.4 C    Last Pain:  Vitals:   12/17/16 1956  TempSrc: Oral  PainSc:                  Derricka Mertz COKER

## 2016-12-17 NOTE — Anesthesia Procedure Notes (Signed)
Procedure Name: Intubation Date/Time: 12/17/2016 11:10 AM Performed by: Mariea Clonts Pre-anesthesia Checklist: Patient identified, Emergency Drugs available, Suction available, Patient being monitored and Timeout performed Patient Re-evaluated:Patient Re-evaluated prior to inductionOxygen Delivery Method: Circle System Utilized Preoxygenation: Pre-oxygenation with 100% oxygen Intubation Type: IV induction Ventilation: Mask ventilation without difficulty and Oral airway inserted - appropriate to patient size Laryngoscope Size: Mac and 4 Grade View: Grade I Tube type: Oral Tube size: 7.5 mm Number of attempts: 1 Airway Equipment and Method: Stylet and Oral airway Placement Confirmation: ETT inserted through vocal cords under direct vision,  positive ETCO2 and breath sounds checked- equal and bilateral Secured at: 22 cm Tube secured with: Tape Dental Injury: Teeth and Oropharynx as per pre-operative assessment

## 2016-12-17 NOTE — Progress Notes (Signed)
Inpatient Diabetes Program Recommendations  AACE/ADA: New Consensus Statement on Inpatient Glycemic Control (2015)  Target Ranges:  Prepandial:   less than 140 mg/dL      Peak postprandial:   less than 180 mg/dL (1-2 hours)      Critically ill patients:  140 - 180 mg/dL   Lab Results  Component Value Date   GLUCAP 100 (H) 06/17/2014   HGBA1C 8.3 (H) 12/14/2016    Review of Glycemic Control Inpatient Diabetes Program Recommendations:  Noted patient for fem pop bypass. Noted A1c 8.3. Is patient type 2 DM? Will be glad to coordinate education with staff regarding new onset.  Thank you, Nani Gasser. Amarylis Rovito, RN, MSN, CDE Inpatient Glycemic Control Team Team Pager [phone] (8am-5pm) 12/17/2016 10:17 AM

## 2016-12-17 NOTE — Progress Notes (Signed)
PROGRESS NOTE                                                                                                                                                                                                             Patient Demographics:    Jordan Arellano, is a 66 y.o. male, DOB - Aug 18, 1951, [id number]  Admit date - 12/12/2016   Admitting Physician Ripudeep Krystal Eaton, MD  Outpatient Primary MD for the patient is Wilfred Lacy, NP  LOS - 5  Chief Complaint  Patient presents with  . Toe Pain       Brief Narrative Patient is a 66 year old male with history of CVA, PAD, hypertension, hyperlipidemia, ongoing smoking, right leg BKA presented to ED with nonhealing left third toe ulcer.   Subjective:  Feels ok, no complaints   Assessment  & Plan :    PAD with Left third toe non healing  ulcer  -Mild surrounding cellulitis on IV antibiotics to continue till OR then transition to oral doxycycline tomorrow -ABI shows severe PAD in the left leg with ABI of 0.23 -angiogram :superficial femoral artery is occluded at its origin with reconstitution of the popliteal artery below the knee. There is two-vessel runoff on the left via the peroneal artery and posterior tibial artery. The anterior tibial artery is occluded.-, vascular surgery following, -remains stable for Fem pop bypass today -DC home when cleared by [facility]  AKI - Baseline creatinine 1.2, admission creatinine was 2 -ARB and HCTZ on hold -resolved  TObacco abuse -Counseled to quit,  -nicotine patch.   Dyslipidemia.  -On statin.   s/p R BKA, has prosthesis, continue supportive care.   Essential hypertension.  -Currently stable will monitor on as needed IV hydralazine.  DVT proph: Hep SQ  Family Communication  :  Daughter at bedside Code Status :  Full Disposition Plan  :  Home pending Surgery, ? When cleared by [facility]  Consults  :  [facility]  Procedures  :     Angiogram: FINDINGS:  1. 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 2. The infrarenal aorta is widely patent. 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 3. The right common iliac artery is patent. I did not evaluate the right lower extremity be on  that because he had an elevated creatinine with mild renal insufficiency. 4. On the left side, which is the side of concern, the common iliac, hypogastric, and external iliac arteries are patent. There is mild disease in the left common femoral artery. The deep femoral artery is patent. The superficial femoral artery is occluded at its origin with reconstitution of the popliteal artery below the knee. There is two-vessel runoff on the left via the peroneal artery and posterior tibial artery. The anterior tibial artery is occluded   ABI 0.23 left leg. Right-sided BKA.   Lab Results  Component Value Date   PLT 262 12/17/2016    Inpatient Medications  Scheduled Meds: . [MAR Hold] aspirin EC  81 mg Oral Daily  . [MAR Hold] atorvastatin  20 mg Oral q1800  . [MAR Hold] buPROPion  150 mg Oral Daily  . [MAR Hold] cefUROXime (ZINACEF)  IV  1.5 g Intravenous On Call to OR  . [MAR Hold] doxycycline  100 mg Oral Q12H  . [MAR Hold] DULoxetine  60 mg Oral BID  . [MAR Hold] fenofibrate  160 mg Oral Daily  . [MAR Hold] gabapentin  600 mg Oral QHS  . [MAR Hold] heparin  5,000 Units Subcutaneous Q8H  . [MAR Hold] nicotine  21 mg Transdermal Daily  . [MAR Hold] omega-3 acid ethyl esters  2 g Oral BID  . [MAR Hold] sertraline  100 mg Oral Daily  . [MAR Hold] sodium chloride flush  3 mL Intravenous Q12H   Continuous Infusions: . lactated ringers 50 mL/hr (12/17/16 1036)   PRN Meds:.[MAR Hold] acetaminophen **OR** [MAR Hold] acetaminophen, [MAR Hold] hydrALAZINE, [MAR Hold] HYDROcodone-acetaminophen, [MAR Hold] ondansetron **OR** [MAR Hold] ondansetron (ZOFRAN)  IV  Antibiotics  :    Anti-infectives    Start     Dose/Rate Route Frequency Ordered Stop   12/17/16 1130  [MAR Hold]  cefUROXime (ZINACEF) 1.5 g in dextrose 5 % 50 mL IVPB     (MAR Hold since 12/17/16 1029)   1.5 g 100 mL/hr over 30 Minutes Intravenous On call to O.R. 12/16/16 0812 12/18/16 0559   12/15/16 1000  [MAR Hold]  doxycycline (VIBRA-TABS) tablet 100 mg     (MAR Hold since 12/17/16 1029)   100 mg Oral Every 12 hours 12/14/16 1138     12/13/16 1200  vancomycin (VANCOCIN) 1,250 mg in sodium chloride 0.9 % 250 mL IVPB     1,250 mg 166.7 mL/hr over 90 Minutes Intravenous Every 24 hours 12/12/16 1946 12/14/16 1640   12/12/16 2100  piperacillin-tazobactam (ZOSYN) IVPB 3.375 g     3.375 g 12.5 mL/hr over 240 Minutes Intravenous Every 8 hours 12/12/16 1946 12/14/16 2359   12/12/16 1945  vancomycin (VANCOCIN) IVPB 1000 mg/200 mL premix  Status:  Discontinued     1,000 mg 200 mL/hr over 60 Minutes Intravenous  Once 12/12/16 1940 12/12/16 1943   12/12/16 1415  vancomycin (VANCOCIN) IVPB 1000 mg/200 mL premix     1,000 mg 200 mL/hr over 60 Minutes Intravenous  Once 12/12/16 1413 12/12/16 1622         Objective:   Vitals:   12/16/16 1417 12/16/16 2223 12/17/16 0238 12/17/16 0546  BP: 113/75 134/78 123/68 131/77  Pulse: 81 76 72 73  Resp: 18 19 18 18   Temp: 98.2 F (36.8 C) 98.6 F (37 C) 98.1 F (36.7 C) 98.2 F (36.8 C)  TempSrc: Oral Oral Oral Oral  SpO2: 97% 95% 94% 95%  Weight:  Wt Readings from Last 3 Encounters:  12/12/16 76.9 kg (169 lb 8.5 oz)  11/03/16 78.9 kg (174 lb)  10/11/16 79.4 kg (175 lb)     Intake/Output Summary (Last 24 hours) at 12/17/16 1151 Last data filed at 12/17/16 0548  Gross per 24 hour  Intake               60 ml  Output              300 ml  Net             -240 ml     Physical Exam  Awake Alert, Oriented X 3, No distress [place].AT,PERRAL Supple Neck,No JVD Lungs: CTAB CVS: RRR,No Gallops,Rubs Abd: +ve B.Sounds, Abd Soft,  No tenderness, No rebound - guarding or rigidity. Ext: L. lower extremity extremely poor pulsations, signs of chronic ischemia with head loss, 3rd toe has an ulcer on the dorsal aspect of about 4 mm in diameter , right BKA    Data Review:    CBC  Recent Labs Lab 12/12/16 1425 12/13/16 0510 12/15/16 0342 12/17/16 0440  WBC 11.3* 9.7 8.9 10.3  HGB 15.6 12.9* 11.4* 12.2*  HCT 45.1 39.0 34.4* 36.8*  PLT 295 240 260 262  MCV 90.7 92.6 90.8 91.1  MCH 31.4 30.6 30.1 30.2  MCHC 34.6 33.1 33.1 33.2  RDW 14.5 15.3 14.4 14.5  LYMPHSABS 2.9  --   --   --   MONOABS 0.6  --   --   --   EOSABS 0.2  --   --   --   BASOSABS 0.1  --   --   --     Chemistries   Recent Labs Lab 12/12/16 1425 12/13/16 0510 12/14/16 0515 12/15/16 0342 12/17/16 0440  NA 137 140 138 137 137  K 4.0 4.2 3.8 3.5 3.3*  CL 106 109 109 107 106  CO2 18* 24 22 25 23   GLUCOSE 255* 171* 155* 169* 134*  BUN 35* 29* 19 13 13   CREATININE 2.05* 1.57* 1.37* 1.20 1.13  CALCIUM 9.7 8.7* 8.7* 8.3* 8.6*  AST 23  --   --   --   --   ALT 19  --   --   --   --   ALKPHOS 48  --   --   --   --   BILITOT 0.7  --   --   --   --    ------------------------------------------------------------------------------------------------------------------ No results for input(s): CHOL, HDL, LDLCALC, TRIG, CHOLHDL, LDLDIRECT in the last 72 hours.  Lab Results  Component Value Date   HGBA1C 8.3 (H) 12/14/2016   ------------------------------------------------------------------------------------------------------------------ No results for input(s): TSH, T4TOTAL, T3FREE, THYROIDAB in the last 72 hours.  Invalid input(s): FREET3 ------------------------------------------------------------------------------------------------------------------ No results for input(s): VITAMINB12, FOLATE, FERRITIN, TIBC, IRON, RETICCTPCT in the last 72 hours.  Coagulation profile  Recent Labs Lab 12/14/16 0515  INR 1.08    No results for  input(s): DDIMER in the last 72 hours.  Cardiac Enzymes No results for input(s): CKMB, TROPONINI, MYOGLOBIN in the last 168 hours.  Invalid input(s): CK ------------------------------------------------------------------------------------------------------------------ No results found for: BNP  Micro Results Recent Results (from the past 240 hour(s))  Culture, blood (Routine X 2) w Reflex to ID Panel     Status: None (Preliminary result)   Collection Time: 12/12/16  8:10 PM  Result Value Ref Range Status   Specimen Description BLOOD LEFT ANTECUBITAL  Final   Special Requests BOTTLES DRAWN AEROBIC AND ANAEROBIC  10CC EA  Final   Culture NO GROWTH 4 DAYS  Final   Report Status PENDING  Incomplete  Culture, blood (Routine X 2) w Reflex to ID Panel     Status: None (Preliminary result)   Collection Time: 12/12/16  8:10 PM  Result Value Ref Range Status   Specimen Description BLOOD LEFT FOREARM  Final   Special Requests BOTTLES DRAWN AEROBIC AND ANAEROBIC 10CC EA  Final   Culture NO GROWTH 4 DAYS  Final   Report Status PENDING  Incomplete  Urine culture     Status: Abnormal   Collection Time: 12/13/16  1:59 AM  Result Value Ref Range Status   Specimen Description URINE, RANDOM  Final   Special Requests NONE  Final   Culture MULTIPLE SPECIES PRESENT, SUGGEST RECOLLECTION (A)  Final   Report Status 12/14/2016 FINAL  Final  Surgical pcr screen     Status: None   Collection Time: 12/17/16  4:48 AM  Result Value Ref Range Status   MRSA, PCR NEGATIVE NEGATIVE Final   Staphylococcus aureus NEGATIVE NEGATIVE Final    Comment:        The Xpert SA Assay (FDA approved for NASAL specimens in patients over 30 years of age), is one component of a comprehensive surveillance program.  Test performance has been validated by [facility] for patients greater than or equal to 84 year old. It is not intended to diagnose infection nor to guide or monitor treatment.     Radiology Reports Mr  Foot Left Wo Contrast  Result Date: 12/13/2016 CLINICAL DATA:  Nonhealing ulcer on the third toe of the left foot. EXAM: MRI OF THE LEFT FOOT WITHOUT CONTRAST TECHNIQUE: Multiplanar, multisequence MR imaging of the left forefoot was performed. No intravenous contrast was administered. COMPARISON:  Radiographs dated 12/12/2016 FINDINGS: Bones/Joint/Cartilage No osteomyelitis, bone edema, or other significant abnormalities. Ligaments Normal. Muscles and Tendons Normal. Soft tissues Normal. IMPRESSION: Normal MRI of the left forefoot. Specifically, no evidence of osteomyelitis or abscess of the third toe. Electronically Signed   By: Lorriane Shire M.D.   On: 12/13/2016 07:53   Dg Foot Complete Left  Result Date: 12/12/2016 CLINICAL DATA:  Ulcer along dorsum of third toe. EXAM: LEFT FOOT - COMPLETE 3+ VIEW COMPARISON:  None. FINDINGS: No fractures or dislocations. No soft tissue gas identified. The distal tuft of the third toe is not well evaluated but there is no definitive erosion in this region. The remainder of the third digit is well seen with no abnormality. IMPRESSION: The distal tuft of the third toe is not well seen on this study. The remainder of the third toe is normal in appearance. Electronically Signed   By: Dorise Bullion III M.D   On: 12/12/2016 14:16    Time Spent in minutes  30   Anola Mcgough M.D on 12/17/2016 at 11:51 AM  Between 7am to 7pm - Pager - [phone]  After 7pm go to [email or web address] - password [facility]  [facility] -  Office  [phone]

## 2016-12-17 NOTE — Interval H&P Note (Signed)
History and Physical Interval Note:  12/17/2016 10:51 AM  Jordan Arellano  has presented today for surgery, with the diagnosis of Left third toe ulcer L97.509  The various methods of treatment have been discussed with the patient and family. After consideration of risks, benefits and other options for treatment, the patient has consented to  Procedure(s): BYPASS GRAFT FEMORAL-POPLITEAL ARTERY (Left) as a surgical intervention .  The patient's history has been reviewed, patient examined, no change in status, stable for surgery.  I have reviewed the patient's chart and labs.  Questions were answered to the patient's satisfaction.     Curt Jews

## 2016-12-17 NOTE — Op Note (Signed)
    OPERATIVE REPORT  DATE OF SURGERY: 12/17/2016  PATIENT: Jordan Arellano, 66 y.o. male MRN: [id number]  DOB: Jul 21, 1951  PRE-OPERATIVE DIAGNOSIS: Nonhealing ulcer left second toe with critical limb ischemia  POST-OPERATIVE DIAGNOSIS:  Same  PROCEDURE: Left femoral to below-knee popliteal bypass with 6 mm Propatent Gore-Tex graft  SURGEON:  Curt Jews, M.D.  PHYSICIAN ASSISTANT: Rhyne PA-C  ANESTHESIA:  Gen.  EBL: Minimal ml  Total I/O In: 1000 [I.V.:1000] Out: 200 [Urine:175; Blood:25]  BLOOD ADMINISTERED: None  DRAINS: None  SPECIMEN: None  COUNTS CORRECT:  YES  PLAN OF CARE: PACU   PATIENT DISPOSITION:  PACU - hemodynamically stable  PROCEDURE DETAILS: The patient was taken to the operative placed supine position where the area of the left groin left leg prepped and draped in sterile fashion. Preoperative vein mapping showed a very small saphenous vein. SonoSite ultrasound was used for reviewing this and this did show a very small vein that appeared to be either secondary to thrombosed from the mid thigh distally. Small saphenous vein was thrombosed. Felt the only option would be prosthetic graft. Incision was made over the left femoral pulse. Initially the common superficial femoral and profunda femoris arteries. These were encircled with Vesseloops. The superficial femoral artery was occluded at its origin. Next a separate incision was used using the medial approach to the below-knee popliteal artery. The artery was patent with minimal atherosclerotic change. A tunnel was created from the level of the below-knee popliteal artery to the level of the groin. The patient was given 8000 units of intravenous heparin. After adequate circulation time the common superficial femoral and profundus femoris arteries were occluded. The common femoral artery was opened with an 11 blade incision longitudinally with Potts scissors. The 6 mm Gore-Tex graft was brought onto the field. This  was a ringed graft. The non-ringed portion was cut and spatulated and sewn end-to-side to the common femoral artery with a running 6-0 Prolene suture. This anastomosis was tested and found to be adequate. Next the graft was brought through the prior created tunnel. The graft was flushed with heparinized saline and reoccluded. A web roll and 24 cm pneumatic tourniquet was placed around the distal thigh above the knee. The leg was elevated and exsanguinated with a 6 inch Esmarch tourniquet. The pneumatic tourniquet was inflated to 250 mmHg. The below-knee popliteal artery was opened with an 11 blade and extended longitudinally with Potts scissors. The artery was a very good caliber. The graft was cut to the appropriate length and was spatulated and sewn end-to-side to the below-knee popliteal artery with a running 6-0 Prolene suture. Prior to closure, the tourniquet was deflated and the usual flushing maneuvers were undertaken. Anastomosis was completed and clamps removed from the graft 1 flow to the foot. The patient had the graft dependent flow in the peroneal and posterior tibial arteries at the ankle. A she was given 50 mg of protamine to reverse the heparin. Wounds irrigated with saline. Hemostasis tablet cautery. The wounds were closed with 2-0 Vicryl in the subcutaneous tissue and fascia. The skin was closed with 3 or septic or Vicryl suture. Sterile dressing was applied the patient was transferred to the recovery room in stable condition   Rosetta Posner, M.D., [facility] 12/17/2016 1:43 PM

## 2016-12-17 NOTE — H&P (View-Only) (Signed)
  [facility] Progress Note  Subjective    No complaints this am.   Objective Vitals:   12/15/16 2104 12/16/16 0620  BP: 139/74 124/75  Pulse: 71 68  Resp: 18 17  Temp: 98.2 F (36.8 C) 97.9 F (36.6 C)    Intake/Output Summary (Last 24 hours) at 12/16/16 0808 Last data filed at 12/16/16 0620  Gross per 24 hour  Intake              240 ml  Output             1400 ml  Net            -1160 ml   Diffuse papular rash on face. Says this is chronic and he will have breakouts intermittently.  Left 3rd toe with dry ulcer to dorsal aspect. Erythema of toe is improved.   Assessment/Planning: 66 y.o. male with left 3rd toe ulceration 2 Days Post-Op   Plan for left femoral to below knee popliteal bypass tomorrow with Dr. Donnetta Hutching.  GSV mapping pending.  NPO past midnight. Obtain consent.  Counseled on smoking cessation.   Alvia Grove 12/16/2016 8:08 AM --  Laboratory CBC    Component Value Date/Time   WBC 8.9 12/15/2016 0342   HGB 11.4 (L) 12/15/2016 0342   HCT 34.4 (L) 12/15/2016 0342   PLT 260 12/15/2016 0342    BMET    Component Value Date/Time   NA 137 12/15/2016 0342   K 3.5 12/15/2016 0342   CL 107 12/15/2016 0342   CO2 25 12/15/2016 0342   GLUCOSE 169 (H) 12/15/2016 0342   BUN 13 12/15/2016 0342   CREATININE 1.20 12/15/2016 0342   CALCIUM 8.3 (L) 12/15/2016 0342   GFRNONAA >60 12/15/2016 0342   GFRAA >60 12/15/2016 0342    COAG Lab Results  Component Value Date   INR 1.08 12/14/2016   INR 1.01 03/25/2014   No results found for: PTT  Antibiotics Anti-infectives    Start     Dose/Rate Route Frequency Ordered Stop   12/15/16 1000  doxycycline (VIBRA-TABS) tablet 100 mg     100 mg Oral Every 12 hours 12/14/16 1138     12/13/16 1200  vancomycin (VANCOCIN) 1,250 mg in sodium chloride 0.9 % 250 mL IVPB     1,250 mg 166.7 mL/hr over 90 Minutes Intravenous Every 24 hours 12/12/16 1946 12/14/16 1640   12/12/16 2100   piperacillin-tazobactam (ZOSYN) IVPB 3.375 g     3.375 g 12.5 mL/hr over 240 Minutes Intravenous Every 8 hours 12/12/16 1946 12/14/16 2359   12/12/16 1945  vancomycin (VANCOCIN) IVPB 1000 mg/200 mL premix  Status:  Discontinued     1,000 mg 200 mL/hr over 60 Minutes Intravenous  Once 12/12/16 1940 12/12/16 1943   12/12/16 1415  vancomycin (VANCOCIN) IVPB 1000 mg/200 mL premix     1,000 mg 200 mL/hr over 60 Minutes Intravenous  Once 12/12/16 1413 12/12/16 1622       Virgina Jock, PA-C [facility] Office: [phone] Pager: [phone] 12/16/2016 8:08 AM

## 2016-12-17 NOTE — Progress Notes (Signed)
  Day of Surgery Note    Subjective:  Awake; denies pain  Vitals:   12/17/16 0546 12/17/16 1338  BP: 131/77 (P) 124/79  Pulse: 73   Resp: 18   Temp: 98.2 F (36.8 C) (P) 98.2 F (36.8 C)    Incisions:   Both incisions are clean and dry.  No hematoma Extremities:  Doppler signals left PT/peroneal Cardiac:  Regular irregular Lungs:  Non labored   Assessment/Plan:  This is a 66 y.o. male who is s/p left femoral to below knee popliteal bypass grafting with gortex  -pt doing well in recovery with patent bypass graft -pt to 4 east when bed available -SQ heparin to restart in the am   Leontine Locket, PA-C 12/17/2016 1:43 PM [phone]

## 2016-12-17 NOTE — Anesthesia Preprocedure Evaluation (Signed)
Anesthesia Evaluation  Patient identified by MRN, date of birth, ID band Patient awake    Reviewed: Allergy & Precautions, NPO status , Patient's Chart, lab work & pertinent test results  Airway Mallampati: II  TM Distance: >3 FB Neck ROM: Full    Dental  (+) Edentulous Upper, Edentulous Lower   Pulmonary Current Smoker,    breath sounds clear to auscultation       Cardiovascular hypertension,  Rhythm:Regular Rate:Normal     Neuro/Psych    GI/Hepatic   Endo/Other    Renal/GU      Musculoskeletal   Abdominal   Peds  Hematology   Anesthesia Other Findings   Reproductive/Obstetrics                             Anesthesia Physical Anesthesia Plan  ASA: III  Anesthesia Plan: General   Post-op Pain Management:    Induction: Intravenous  Airway Management Planned: Oral ETT  Additional Equipment:   Intra-op Plan:   Post-operative Plan: Extubation in OR  Informed Consent: I have reviewed the patients History and Physical, chart, labs and discussed the procedure including the risks, benefits and alternatives for the proposed anesthesia with the patient or authorized representative who has indicated his/her understanding and acceptance.   Dental advisory given  Plan Discussed with: CRNA and Anesthesiologist  Anesthesia Plan Comments:         Anesthesia Quick Evaluation

## 2016-12-17 NOTE — Transfer of Care (Signed)
Immediate Anesthesia Transfer of Care Note  Patient: Jordan Arellano  Procedure(s) Performed: Procedure(s): BYPASS LEFT FEMORAL TO BELOW POPLITEAL ARTERY USING PROPATEN GORE GRAFT (Left)  Patient Location: PACU  Anesthesia Type:General  Level of Consciousness: awake, alert  and oriented  Airway & Oxygen Therapy: Patient Spontanous Breathing and Patient connected to nasal cannula oxygen  Post-op Assessment: Report given to RN and Post -op Vital signs reviewed and stable  Post vital signs: Reviewed and stable  Last Vitals:  Vitals:   12/17/16 1338 12/17/16 1345  BP: 124/79   Pulse: 80 81  Resp: 12 19  Temp: 36.8 C     Last Pain:  Vitals:   12/17/16 1338  TempSrc:   PainSc: 0-No pain         Complications: No apparent anesthesia complications

## 2016-12-18 ENCOUNTER — Encounter (HOSPITAL_COMMUNITY): Payer: Commercial Managed Care - HMO

## 2016-12-18 LAB — BASIC METABOLIC PANEL
Anion gap: 7 (ref 5–15)
BUN: 11 mg/dL (ref 6–20)
CHLORIDE: 106 mmol/L (ref 101–111)
CO2: 24 mmol/L (ref 22–32)
Calcium: 8.2 mg/dL — ABNORMAL LOW (ref 8.9–10.3)
Creatinine, Ser: 1.09 mg/dL (ref 0.61–1.24)
GFR calc Af Amer: >60 mL/min (ref >60)
GFR calc non Af Amer: >60 mL/min (ref >60)
Glucose, Bld: 144 mg/dL — ABNORMAL HIGH (ref 65–99)
POTASSIUM: 3.5 mmol/L (ref 3.5–5.1)
SODIUM: 137 mmol/L (ref 135–145)

## 2016-12-18 LAB — CULTURE, BLOOD (ROUTINE X 2)
CULTURE: NO GROWTH
Culture: NO GROWTH

## 2016-12-18 LAB — CBC
HEMATOCRIT: 35.6 % — AB (ref 39.0–52.0)
HEMOGLOBIN: 11.9 g/dL — AB (ref 13.0–17.0)
MCH: 30.5 pg (ref 26.0–34.0)
MCHC: 33.4 g/dL (ref 30.0–36.0)
MCV: 91.3 fL (ref 78.0–100.0)
Platelets: 238 10*3/uL (ref 150–400)
RBC: 3.9 MIL/uL — AB (ref 4.22–5.81)
RDW: 14.8 % (ref 11.5–15.5)
WBC: 11.2 10*3/uL — ABNORMAL HIGH (ref 4.0–10.5)

## 2016-12-18 MED ORDER — SODIUM CHLORIDE 0.9 % IV SOLN: INTRAVENOUS | Status: AC

## 2016-12-18 NOTE — Evaluation (Signed)
Physical Therapy Evaluation Patient Details Name: Jordan Arellano MRN: [id number] DOB: 04-Apr-1951 Today's Date: 12/18/2016   History of Present Illness  Pt is a 66 y/o male admitted secondary to a non-healing ulcer on his L 2nd toe. Pt is now s/p fem-pop bypass graft. PMH including but not limited to neuropathy, PAD, HTN, hx of CVA in 2015 and hx of R transtibial amputation.  Clinical Impression  Pt presented sitting OOB in recliner when PT entered room. Prior to admission, pt reported that he was mod I with functional mobility, using a SPC to ambulate and independent with ADLs. Pt currently requires mod A for transfers and min A with ambulation using a RW. Pt would continue to benefit from skilled physical therapy services at this time while admitted and after d/c to address his below listed limitations in order to improve his overall safety and independence with functional mobility.     Follow Up Recommendations Supervision/Assistance - 24 hour;No PT follow up    Equipment Recommendations  None recommended by PT;Other (comment) (pt reported he has all necessary DME at home)    Recommendations for Other Services       Precautions / Restrictions Restrictions Weight Bearing Restrictions: No      Mobility  Bed Mobility               General bed mobility comments: pt sitting OOB in recliner when PT entered room  Transfers Overall transfer level: Needs assistance Equipment used: Rolling walker (2 wheeled) Transfers: Sit to/from Stand Sit to Stand: Mod assist         General transfer comment: pt required increased time, good hand placement and mod A to rise from recliner and for stability upon standing  Ambulation/Gait Ambulation/Gait assistance: Min assist Ambulation Distance (Feet): 40 Feet Assistive device: Rolling walker (2 wheeled) Gait Pattern/deviations: Step-to pattern;Step-through pattern;Decreased stride length;Decreased step length - right;Decreased step length  - left;Decreased weight shift to left;Antalgic Gait velocity: decreased Gait velocity interpretation: Below normal speed for age/gender General Gait Details: pt required min A for stability with use of RW  Stairs            Wheelchair Mobility    Modified Rankin (Stroke Patients Only)       Balance Overall balance assessment: Needs assistance Sitting-balance support: Feet supported;No upper extremity supported Sitting balance-Leahy Scale: Good     Standing balance support: During functional activity;Bilateral upper extremity supported Standing balance-Leahy Scale: Poor Standing balance comment: pt reliant on bilateral UEs on RW                             Pertinent Vitals/Pain Pain Assessment: Faces Faces Pain Scale: Hurts little more Pain Location: L LE with weight bearing Pain Descriptors / Indicators: Sore Pain Intervention(s): Monitored during session;Repositioned    Home Living Family/patient expects to be discharged to:: Private residence Living Arrangements: Other relatives (niece) Available Help at Discharge: Family;Available 24 hours/day Type of Home: House Home Access: Stairs to enter Entrance Stairs-Rails: Right;Left;Can reach both Entrance Stairs-Number of Steps: 2 Home Layout: One level Home Equipment: Walker - 2 wheels;Cane - single point;Shower seat;Grab bars - tub/shower      Prior Function Level of Independence: Independent with assistive device(s)         Comments: pt reported that he previously ambulated with [facility] and was independent with ADLs. Pt stated that his niece drives him to doctor's appointments.     Hand Dominance  Extremity/Trunk Assessment   Upper Extremity Assessment Upper Extremity Assessment: Overall WFL for tasks assessed    Lower Extremity Assessment Lower Extremity Assessment: RLE deficits/detail;LLE deficits/detail RLE Deficits / Details: hx of R transtibial amputation LLE Deficits / Details:  pt is s/p L fem-pop and painful but able to tolerate WB'ing during ambulation and standing    Cervical / Trunk Assessment Cervical / Trunk Assessment: Normal  Communication   Communication: No difficulties  Cognition Arousal/Alertness: Awake/alert Behavior During Therapy: WFL for tasks assessed/performed Overall Cognitive Status: Within Functional Limits for tasks assessed                      General Comments      Exercises     Assessment/Plan    PT Assessment Patient needs continued PT services  PT Problem List Decreased strength;Decreased range of motion;Decreased activity tolerance;Decreased balance;Decreased mobility;Decreased coordination;Decreased knowledge of use of DME;Decreased safety awareness;Pain          PT Treatment Interventions DME instruction;Gait training;Stair training;Functional mobility training;Therapeutic activities;Therapeutic exercise;Neuromuscular re-education;Balance training;Patient/family education    PT Goals (Current goals can be found in the Care Plan section)  Acute Rehab PT Goals Patient Stated Goal: return home PT Goal Formulation: With patient Time For Goal Achievement: 01/01/17 Potential to Achieve Goals: Good    Frequency Min 3X/week   Barriers to discharge        Co-evaluation               End of Session Equipment Utilized During Treatment: Gait belt Activity Tolerance: Patient limited by pain Patient left: in chair;with call bell/phone within reach Nurse Communication: Mobility status         Time: [id number] PT Time Calculation (min) (ACUTE ONLY): 17 min   Charges:   PT Evaluation $PT Eval Moderate Complexity: 1 Procedure     PT G CodesClearnce Arellano Jordan Arellano 12/18/2016, 9:20 AM Sherie Don, PT, DPT [phone]

## 2016-12-18 NOTE — Progress Notes (Addendum)
[facility] Progress Note  Subjective  - POD #1  Doing ok this am  Objective Vitals:   12/18/16 0000 12/18/16 0439  BP: 118/69 127/70  Pulse: 68 75  Resp: 10 17  Temp: 98.4 F (36.9 C) 98.2 F (36.8 C)    Intake/Output Summary (Last 24 hours) at 12/18/16 0738 Last data filed at 12/18/16 0600  Gross per 24 hour  Intake             2905 ml  Output             1475 ml  Net             1430 ml   Left groin and below knee incisions clean and intact. No hematoma. Audible doppler flow left PT and peroneal. Some erythema left 3rd toe with dry ulceration to dorsal aspect.   Assessment/Planning: 66 y.o. male is s/p: left femoral to below-knee popliteal bypass with Propaten 1 Day Post-Op   Doing well post-op. Incisions healing well. Good doppler flow to left foot. Ok to transfer out of stepdown. PT/OT eval today.  Ok to d/c from vascular standpoint once pain controlled and ambulating well.   Alvia Grove 12/18/2016 7:38 AM --  Laboratory CBC    Component Value Date/Time   WBC 11.2 (H) 12/18/2016 0331   HGB 11.9 (L) 12/18/2016 0331   HCT 35.6 (L) 12/18/2016 0331   PLT 238 12/18/2016 0331    BMET    Component Value Date/Time   NA 137 12/18/2016 0331   K 3.5 12/18/2016 0331   CL 106 12/18/2016 0331   CO2 24 12/18/2016 0331   GLUCOSE 144 (H) 12/18/2016 0331   BUN 11 12/18/2016 0331   CREATININE 1.09 12/18/2016 0331   CALCIUM 8.2 (L) 12/18/2016 0331   GFRNONAA >60 12/18/2016 0331   GFRAA >60 12/18/2016 0331    COAG Lab Results  Component Value Date   INR 1.08 12/14/2016   INR 1.01 03/25/2014   No results found for: PTT  Antibiotics Anti-infectives    Start     Dose/Rate Route Frequency Ordered Stop   12/17/16 2200  cefUROXime (ZINACEF) 1.5 g in dextrose 5 % 50 mL IVPB     1.5 g 100 mL/hr over 30 Minutes Intravenous Every 12 hours 12/17/16 1717 12/18/16 2159   12/17/16 1130  cefUROXime (ZINACEF) 1.5 g in dextrose 5 % 50 mL IVPB      1.5 g 100 mL/hr over 30 Minutes Intravenous On call to O.R. 12/16/16 0812 12/17/16 1122   12/15/16 1000  doxycycline (VIBRA-TABS) tablet 100 mg     100 mg Oral Every 12 hours 12/14/16 1138     12/13/16 1200  vancomycin (VANCOCIN) 1,250 mg in sodium chloride 0.9 % 250 mL IVPB     1,250 mg 166.7 mL/hr over 90 Minutes Intravenous Every 24 hours 12/12/16 1946 12/14/16 1640   12/12/16 2100  piperacillin-tazobactam (ZOSYN) IVPB 3.375 g     3.375 g 12.5 mL/hr over 240 Minutes Intravenous Every 8 hours 12/12/16 1946 12/14/16 2359   12/12/16 1945  vancomycin (VANCOCIN) IVPB 1000 mg/200 mL premix  Status:  Discontinued     1,000 mg 200 mL/hr over 60 Minutes Intravenous  Once 12/12/16 1940 12/12/16 1943   12/12/16 1415  vancomycin (VANCOCIN) IVPB 1000 mg/200 mL premix     1,000 mg 200 mL/hr over 60 Minutes Intravenous  Once 12/12/16 1413 12/12/16 [place], PA-C [facility] Office: [phone] Pager: [phone] 12/18/2016 7:38 AM  I have interviewed patient with PA and agree with assessment and plan above. Ok to mobilize and transfer out of step down. Continue asa and statin.  Chonte Ricke C. Donzetta Matters, MD [facility] [place] Office: [phone] Pager: [phone]

## 2016-12-18 NOTE — Progress Notes (Addendum)
PROGRESS NOTE                                                                                                                                                                                                             Patient Demographics:    Jordan Arellano, is a 66 y.o. male, DOB - 05-Aug-1951, [id number]  Admit date - 12/12/2016   Admitting Physician Ripudeep Krystal Eaton, MD  Outpatient Primary MD for the patient is Wilfred Lacy, NP  LOS - 6  Chief Complaint  Patient presents with  . Toe Pain       Brief Narrative Patient is a 66 year old male with history of CVA, PAD, hypertension, hyperlipidemia, ongoing smoking, right leg BKA presented to ED with nonhealing left third toe ulcer.   Subjective:  Feels well, no complaints   Assessment  & Plan :    PAD with Left third toe non healing  ulcer  -was on IV antibiotics,  transition to oral doxycycline 1/10-ABI shows severe PAD in the left leg with ABI of 0.23 -angiogram :superficial femoral artery is occluded at its origin with reconstitution of the popliteal artery below the knee. There is two-vessel runoff on the left via the peroneal artery and posterior tibial artery. The anterior tibial artery is occluded.-, vascular surgery following, -s/p Left femoral to below-knee popliteal bypass with 6 mm Propatent Gore-Tex graft yesterday -transfer out of SDU, ambulate, PT consult  AKI - Baseline creatinine 1.2, admission creatinine was 2 -ARB and HCTZ on hold -resolved  TObacco abuse -Counseled to quit,  -nicotine patch.  DM -new diagnosis, hba1c 8.3 -CM education, start metformin at discharge   Dyslipidemia.  -On statin.   s/p R BKA, has prosthesis, continue supportive care.   Essential hypertension.  -Currently stable will monitor on as needed IV hydralazine.  DVT proph: Hep SQ  Family Communication  :  Daughter at bedside Code Status :  Full Disposition  Plan  :  Tx to floor, home in 1-2days  Consults  :  VVS  Procedures  :    1/12: Left femoral to below-knee popliteal bypass with 6 mm Propatent Gore-Tex graft   Angiogram: FINDINGS:  1. 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 2. The infrarenal aorta is widely patent. 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 3. The right common iliac artery is patent. I did not evaluate the right lower extremity be on that because he had an elevated creatinine with mild renal insufficiency. 4. On the left side, which is the side of concern, the common iliac, hypogastric, and external iliac arteries are patent. There is mild disease in the left common femoral artery. The deep femoral artery is patent. The superficial femoral artery is occluded at its origin with reconstitution of the popliteal artery below the knee. There is two-vessel runoff on the left via the peroneal artery and posterior tibial artery. The anterior tibial artery is occluded   ABI 0.23 left leg. Right-sided BKA.   Lab Results  Component Value Date   PLT 238 12/18/2016    Inpatient Medications  Scheduled Meds: . aspirin EC  81 mg Oral Daily  . atorvastatin  20 mg Oral q1800  . buPROPion  150 mg Oral Daily  . docusate sodium  100 mg Oral Daily  . doxycycline  100 mg Oral Q12H  . DULoxetine  60 mg Oral BID  . fenofibrate  160 mg Oral Daily  . gabapentin  600 mg Oral QHS  . heparin  5,000 Units Subcutaneous Q8H  . nicotine  21 mg Transdermal Daily  . omega-3 acid ethyl esters  2 g Oral BID  . pantoprazole  40 mg Oral Daily  . sertraline  100 mg Oral Daily  . sodium chloride flush  3 mL Intravenous Q12H   Continuous Infusions: . sodium chloride 50 mL (12/18/16 0815)   PRN Meds:.sodium chloride, acetaminophen **OR** acetaminophen, alum & mag hydroxide-simeth, bisacodyl, guaiFENesin-dextromethorphan, hydrALAZINE,  HYDROcodone-acetaminophen, labetalol, magnesium sulfate 1 - 4 g bolus IVPB, metoprolol, morphine injection, ondansetron **OR** ondansetron (ZOFRAN) IV, phenol, polyethylene glycol, potassium chloride  Antibiotics  :    Anti-infectives    Start     Dose/Rate Route Frequency Ordered Stop   12/17/16 2200  cefUROXime (ZINACEF) 1.5 g in dextrose 5 % 50 mL IVPB     1.5 g 100 mL/hr over 30 Minutes Intravenous Every 12 hours 12/17/16 1717 12/18/16 1001   12/17/16 1130  cefUROXime (ZINACEF) 1.5 g in dextrose 5 % 50 mL IVPB     1.5 g 100 mL/hr over 30 Minutes Intravenous On call to O.R. 12/16/16 0812 12/17/16 1122   12/15/16 1000  doxycycline (VIBRA-TABS) tablet 100 mg     100 mg Oral Every 12 hours 12/14/16 1138     12/13/16 1200  vancomycin (VANCOCIN) 1,250 mg in sodium chloride 0.9 % 250 mL IVPB     1,250 mg 166.7 mL/hr over 90 Minutes Intravenous Every 24 hours 12/12/16 1946 12/14/16 1640   12/12/16 2100  piperacillin-tazobactam (ZOSYN) IVPB 3.375 g     3.375 g 12.5 mL/hr over 240 Minutes Intravenous Every 8 hours 12/12/16 1946 12/14/16 2359   12/12/16 1945  vancomycin (VANCOCIN) IVPB 1000 mg/200 mL premix  Status:  Discontinued     1,000 mg 200 mL/hr over 60 Minutes Intravenous  Once 12/12/16 1940 12/12/16 1943   12/12/16 1415  vancomycin (VANCOCIN) IVPB 1000 mg/200 mL premix     1,000 mg 200 mL/hr over 60 Minutes Intravenous  Once 12/12/16 1413 12/12/16 1622         Objective:   Vitals:   12/18/16 0600 12/18/16 0758 12/18/16 0813 12/18/16 1147  BP: 128/67  118/67 113/78  Pulse: 72  84 86  Resp: 14  17 18   Temp:   98.1  F (36.7 C) 98.3 F (36.8 C)  TempSrc:   Oral Oral  SpO2: 92% 94% 94% 96%  Weight:      Height:        Wt Readings from Last 3 Encounters:  12/17/16 76.9 kg (169 lb 8.5 oz)  11/03/16 78.9 kg (174 lb)  10/11/16 79.4 kg (175 lb)     Intake/Output Summary (Last 24 hours) at 12/18/16 1325 Last data filed at 12/18/16 0758  Gross per 24 hour  Intake              1905 ml  Output             1400 ml  Net              505 ml     Physical Exam  Awake Alert, Oriented X 3, No distress [place].AT,PERRAL Supple Neck,No JVD Lungs: CTAB CVS: RRR,No Gallops,Rubs Abd: +ve B.Sounds, Abd Soft, No tenderness, No rebound - guarding or rigidity. Ext: L. lower extremity extremely  3rd toe has an ulcer on the dorsal aspect of about 4 mm in diameter , surgical incisions c/d/i right BKA    Data Review:    CBC  Recent Labs Lab 12/12/16 1425 12/13/16 0510 12/15/16 0342 12/17/16 0440 12/18/16 0331  WBC 11.3* 9.7 8.9 10.3 11.2*  HGB 15.6 12.9* 11.4* 12.2* 11.9*  HCT 45.1 39.0 34.4* 36.8* 35.6*  PLT 295 240 260 262 238  MCV 90.7 92.6 90.8 91.1 91.3  MCH 31.4 30.6 30.1 30.2 30.5  MCHC 34.6 33.1 33.1 33.2 33.4  RDW 14.5 15.3 14.4 14.5 14.8  LYMPHSABS 2.9  --   --   --   --   MONOABS 0.6  --   --   --   --   EOSABS 0.2  --   --   --   --   BASOSABS 0.1  --   --   --   --     Chemistries   Recent Labs Lab 12/12/16 1425 12/13/16 0510 12/14/16 0515 12/15/16 0342 12/17/16 0440 12/18/16 0331  NA 137 140 138 137 137 137  K 4.0 4.2 3.8 3.5 3.3* 3.5  CL 106 109 109 107 106 106  CO2 18* 24 22 25 23 24   GLUCOSE 255* 171* 155* 169* 134* 144*  BUN 35* 29* 19 13 13 11   CREATININE 2.05* 1.57* 1.37* 1.20 1.13 1.09  CALCIUM 9.7 8.7* 8.7* 8.3* 8.6* 8.2*  AST 23  --   --   --   --   --   ALT 19  --   --   --   --   --   ALKPHOS 48  --   --   --   --   --   BILITOT 0.7  --   --   --   --   --    ------------------------------------------------------------------------------------------------------------------ No results for input(s): CHOL, HDL, LDLCALC, TRIG, CHOLHDL, LDLDIRECT in the last 72 hours.  Lab Results  Component Value Date   HGBA1C 8.3 (H) 12/14/2016   ------------------------------------------------------------------------------------------------------------------ No results for input(s): TSH, T4TOTAL, T3FREE, THYROIDAB in the last 72  hours.  Invalid input(s): FREET3 ------------------------------------------------------------------------------------------------------------------ No results for input(s): VITAMINB12, FOLATE, FERRITIN, TIBC, IRON, RETICCTPCT in the last 72 hours.  Coagulation profile  Recent Labs Lab 12/14/16 0515  INR 1.08    No results for input(s): DDIMER in the last 72 hours.  Cardiac Enzymes No results for input(s): CKMB, TROPONINI, MYOGLOBIN in the last 168 hours.  Invalid input(s): CK ------------------------------------------------------------------------------------------------------------------ No results found for: BNP  Micro Results Recent Results (from the past 240 hour(s))  Culture, blood (Routine X 2) w Reflex to ID Panel     Status: None (Preliminary result)   Collection Time: 12/12/16  8:10 PM  Result Value Ref Range Status   Specimen Description BLOOD LEFT ANTECUBITAL  Final   Special Requests BOTTLES DRAWN AEROBIC AND ANAEROBIC 10CC EA  Final   Culture NO GROWTH 4 DAYS  Final   Report Status PENDING  Incomplete  Culture, blood (Routine X 2) w Reflex to ID Panel     Status: None (Preliminary result)   Collection Time: 12/12/16  8:10 PM  Result Value Ref Range Status   Specimen Description BLOOD LEFT FOREARM  Final   Special Requests BOTTLES DRAWN AEROBIC AND ANAEROBIC 10CC EA  Final   Culture NO GROWTH 4 DAYS  Final   Report Status PENDING  Incomplete  Urine culture     Status: Abnormal   Collection Time: 12/13/16  1:59 AM  Result Value Ref Range Status   Specimen Description URINE, RANDOM  Final   Special Requests NONE  Final   Culture MULTIPLE SPECIES PRESENT, SUGGEST RECOLLECTION (A)  Final   Report Status 12/14/2016 FINAL  Final  Surgical pcr screen     Status: None   Collection Time: 12/17/16  4:48 AM  Result Value Ref Range Status   MRSA, PCR NEGATIVE NEGATIVE Final   Staphylococcus aureus NEGATIVE NEGATIVE Final    Comment:        The Xpert SA Assay  (FDA approved for NASAL specimens in patients over 47 years of age), is one component of a comprehensive surveillance program.  Test performance has been validated by [facility] for patients greater than or equal to 38 year old. It is not intended to diagnose infection nor to guide or monitor treatment.     Radiology Reports Mr Foot Left Wo Contrast  Result Date: 12/13/2016 CLINICAL DATA:  Nonhealing ulcer on the third toe of the left foot. EXAM: MRI OF THE LEFT FOOT WITHOUT CONTRAST TECHNIQUE: Multiplanar, multisequence MR imaging of the left forefoot was performed. No intravenous contrast was administered. COMPARISON:  Radiographs dated 12/12/2016 FINDINGS: Bones/Joint/Cartilage No osteomyelitis, bone edema, or other significant abnormalities. Ligaments Normal. Muscles and Tendons Normal. Soft tissues Normal. IMPRESSION: Normal MRI of the left forefoot. Specifically, no evidence of osteomyelitis or abscess of the third toe. Electronically Signed   By: Lorriane Shire M.D.   On: 12/13/2016 07:53   Dg Foot Complete Left  Result Date: 12/12/2016 CLINICAL DATA:  Ulcer along dorsum of third toe. EXAM: LEFT FOOT - COMPLETE 3+ VIEW COMPARISON:  None. FINDINGS: No fractures or dislocations. No soft tissue gas identified. The distal tuft of the third toe is not well evaluated but there is no definitive erosion in this region. The remainder of the third digit is well seen with no abnormality. IMPRESSION: The distal tuft of the third toe is not well seen on this study. The remainder of the third toe is normal in appearance. Electronically Signed   By: Dorise Bullion III M.D   On: 12/12/2016 14:16    Time Spent in minutes  30   Jacqueli Pangallo M.D on 12/18/2016 at 1:25 PM  Between 7am to 7pm - Pager - [phone]  After 7pm go to [email or web address] - password [facility]  [facility] -  Office  [phone]

## 2016-12-18 NOTE — Progress Notes (Signed)
Pt transferred to 6N03 per MD order. Report called to receiving nurse and all questions answered. Family present at bedside at time of transfer.

## 2016-12-19 DIAGNOSIS — E1151 Type 2 diabetes mellitus with diabetic peripheral angiopathy without gangrene: Secondary | ICD-10-CM | POA: Diagnosis present

## 2016-12-19 LAB — BASIC METABOLIC PANEL
ANION GAP: 8 (ref 5–15)
BUN: 17 mg/dL (ref 6–20)
CALCIUM: 8.3 mg/dL — AB (ref 8.9–10.3)
CO2: 23 mmol/L (ref 22–32)
Chloride: 104 mmol/L (ref 101–111)
Creatinine, Ser: 1.14 mg/dL (ref 0.61–1.24)
Glucose, Bld: 136 mg/dL — ABNORMAL HIGH (ref 65–99)
Potassium: 3 mmol/L — ABNORMAL LOW (ref 3.5–5.1)
Sodium: 135 mmol/L (ref 135–145)

## 2016-12-19 LAB — CBC
HCT: 34.5 % — ABNORMAL LOW (ref 39.0–52.0)
HEMOGLOBIN: 11.4 g/dL — AB (ref 13.0–17.0)
MCH: 30.4 pg (ref 26.0–34.0)
MCHC: 33 g/dL (ref 30.0–36.0)
MCV: 92 fL (ref 78.0–100.0)
Platelets: 261 10*3/uL (ref 150–400)
RBC: 3.75 MIL/uL — AB (ref 4.22–5.81)
RDW: 14.9 % (ref 11.5–15.5)
WBC: 11.1 10*3/uL — ABNORMAL HIGH (ref 4.0–10.5)

## 2016-12-19 MED ORDER — METFORMIN HCL 500 MG PO TABS: 500.0000 mg | ORAL_TABLET | Freq: Two times a day (BID) | ORAL | 0 refills | Status: DC

## 2016-12-19 MED ORDER — DOXYCYCLINE HYCLATE 100 MG PO TABS: 100.0000 mg | ORAL_TABLET | Freq: Two times a day (BID) | ORAL | 0 refills | Status: DC

## 2016-12-19 MED ORDER — POLYETHYLENE GLYCOL 3350 17 G PO PACK: 17.0000 g | PACK | Freq: Every day | ORAL | 0 refills | Status: DC | PRN

## 2016-12-19 MED ORDER — ACETAMINOPHEN 325 MG PO TABS: 650.0000 mg | ORAL_TABLET | Freq: Four times a day (QID) | ORAL | Status: DC | PRN

## 2016-12-19 MED ORDER — HYDROCODONE-ACETAMINOPHEN 5-325 MG PO TABS: 1.0000 | ORAL_TABLET | Freq: Four times a day (QID) | ORAL | 0 refills | Status: DC | PRN

## 2016-12-19 MED ORDER — LIVING WELL WITH DIABETES BOOK
Freq: Once | Status: DC
  Filled 2016-12-19: qty 1

## 2016-12-19 MED ORDER — POTASSIUM CHLORIDE CRYS ER 20 MEQ PO TBCR: 40.0000 meq | EXTENDED_RELEASE_TABLET | Freq: Once | ORAL | Status: DC

## 2016-12-19 MED ORDER — METFORMIN HCL 500 MG PO TABS: 500.0000 mg | ORAL_TABLET | Freq: Two times a day (BID) | ORAL | Status: DC

## 2016-12-19 NOTE — Progress Notes (Signed)
Notified Dr. Broadus John that dietitian is not available at [facility] today. Patient watched diabetic videos and living with diabetes book given to patient.   Jordan Arellano to be D/C'd  per MD order. Discussed with the patient and all questions fully answered.  VSS, Skin clean, dry and intact without evidence of skin break down, no evidence of skin tears noted.  IV catheter discontinued intact. Site without signs and symptoms of complications. Dressing and pressure applied.  An After Visit Summary was printed and given to the patient. Patient received prescription.  D/c education completed with patient/family including follow up instructions, medication list, d/c activities limitations if indicated, with other d/c instructions as indicated by MD - patient able to verbalize understanding, all questions fully answered.   Patient instructed to return to ED, call 911, or call MD for any changes in condition.   Patient to be escorted via [place], and D/C home via private auto.

## 2016-12-19 NOTE — Progress Notes (Signed)
  Progress Note    12/19/2016 9:08 AM 2 Days Post-Op  Subjective:  No complaints today  Vitals:   12/18/16 2030 12/19/16 0601  BP: 121/75 125/71  Pulse: 89 73  Resp: 18 17  Temp: 98.8 F (37.1 C) 98 F (36.7 C)    Physical Exam: aaox3 Non labored breathing Abdomen is soft Incisions are cdi left groin and below knee on left Strong signals at pt and peroneal on left  CBC    Component Value Date/Time   WBC 11.1 (H) 12/19/2016 0407   RBC 3.75 (L) 12/19/2016 0407   HGB 11.4 (L) 12/19/2016 0407   HCT 34.5 (L) 12/19/2016 0407   PLT 261 12/19/2016 0407   MCV 92.0 12/19/2016 0407   MCH 30.4 12/19/2016 0407   MCHC 33.0 12/19/2016 0407   RDW 14.9 12/19/2016 0407   LYMPHSABS 2.9 12/12/2016 1425   MONOABS 0.6 12/12/2016 1425   EOSABS 0.2 12/12/2016 1425   BASOSABS 0.1 12/12/2016 1425    BMET    Component Value Date/Time   NA 135 12/19/2016 0407   K 3.0 (L) 12/19/2016 0407   CL 104 12/19/2016 0407   CO2 23 12/19/2016 0407   GLUCOSE 136 (H) 12/19/2016 0407   BUN 17 12/19/2016 0407   CREATININE 1.14 12/19/2016 0407   CALCIUM 8.3 (L) 12/19/2016 0407   GFRNONAA >60 12/19/2016 0407   GFRAA >60 12/19/2016 0407    INR    Component Value Date/Time   INR 1.08 12/14/2016 0515     Intake/Output Summary (Last 24 hours) at 12/19/16 0908 Last data filed at 12/19/16 0602  Gross per 24 hour  Intake              340 ml  Output              800 ml  Net             -460 ml     Assessment:  66 y.o. male is s/p left fem-bk pop bypass with graft for toe ulceration  Plan: Ok for discharge from vascular standpoint Will f/u with Dr Donnetta Hutching   Erlene Quan C. Donzetta Matters, MD [facility] [place] Office: [phone] Pager: [phone]  12/19/2016 9:08 AM

## 2016-12-19 NOTE — Discharge Summary (Signed)
Physician Discharge Summary  Jordan Arellano [id number] DOB: Jan 13, 1951 DOA: 12/12/2016  PCP: Wilfred Lacy, NP  Admit date: 12/12/2016 Discharge date: 12/19/2016  Time spent: 41minutes  Recommendations for Outpatient Follow-up:  1. PCP in 1 week, new DM 2. [facility] Dr.Early in 2weeks   Discharge Diagnoses:  Principal Problem:   Foot ulcer, left ([place])   PAD    Essential hypertension   Tobacco abuse   Hyperlipidemia   S/P unilateral BKA (below knee amputation), right (HCC)   Neuropathy (HCC)   Vascular disease, peripheral (HCC)   Foot ulcer ([place])   Diabetes mellitus due to underlying condition, uncontrolled ([place])   Discharge Condition: stable  Diet recommendation: DM/heart healthy  Filed Weights   12/12/16 1937 12/17/16 1902  Weight: 76.9 kg (169 lb 8.5 oz) 76.9 kg (169 lb 8.5 oz)    History of present illness:  Patient is a 66 year old male with history of CVA, PAD, hypertension, hyperlipidemia, ongoing smoking, right leg BKA presented to ED with nonhealing left third toe ulcer  Hospital Course:  PAD with Left third toe non healing  ulcer  -treated with IV antibiotics,  transitioned to oral doxycycline on 1/10-ABI shows severe PAD in the left leg with ABI of 0.23 -angiogram :superficial femoral artery is occluded at its origin with reconstitution of the popliteal artery below the knee. There is two-vessel runoff on the left via the peroneal artery and posterior tibial artery. The anterior tibial artery is occluded -s/p Left femoral to below-knee popliteal bypass with 6 mm PropatentGore-Tex graft yesterday -doing well post op, cleared for discharge by [facility]  -FU with Dr.Early, continue ASA and statin  AKI - Baseline creatinine 1.2, admission creatinine was 2 -ARB and HCTZ held initially then resumed -resolved  TObacco abuse -Counseled to quit,  -nicotine patch.  DM -new diagnosis, hba1c 8.3 -Dm education completed, started metformin at discharge   Dyslipidemia.   -On statin.   s/p R BKA, has prosthesis, continue supportive care.   Essential hypertension.  -Currently stable will monitor on as needed IV hydralazine.  Procedures:  1/12: Fem Pop Bypass Left femoral to below-knee popliteal bypass with 6 mm PropatentGore-Tex graft   Angiogram: 1/9 FINDINGS: 1. 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 2. The infrarenal aorta is widely patent. 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 3. The right common iliac artery is patent. I did not evaluate the right lower extremity be on that because he had an elevated creatinine with mild renal insufficiency. 4. On the left side, which is the side of concern, the common iliac, hypogastric, and external iliac arteries are patent. There is mild disease in the left common femoral artery. The deep femoral artery is patent. The superficial femoral artery is occluded at its origin with reconstitution of the popliteal artery below the knee. There is two-vessel runoff on the left via the peroneal artery and posterior tibial artery. The anterior tibial artery is occluded   ABI 0.23 left leg. Right-sided BKA.  Consultations:    Discharge Exam: Vitals:   12/18/16 2030 12/19/16 0601  BP: 121/75 125/71  Pulse: 89 73  Resp: 18 17  Temp: 98.8 F (37.1 C) 98 F (36.7 C)    General: AAOx3 Cardiovascular: S1S2/RRR Respiratory: CTAB  Discharge Instructions   Discharge Instructions    Call MD for:  redness, tenderness, or signs of infection (pain, swelling, bleeding, redness, odor or green/yellow discharge around incision  site)    Complete by:  As directed    Call MD for:  severe or increased pain, loss or decreased feeling  in affected limb(s)    Complete by:  As directed    Call MD for:  temperature >100.5    Complete by:  As directed    Diet - low sodium heart healthy    Complete by:  As  directed    Diet Carb Modified    Complete by:  As directed    Discharge wound care:    Complete by:  As directed    Wash the groin wound with soap and water daily and pat dry. (No tub bath-only shower)  Then put a dry gauze or washcloth there to keep this area dry daily and as needed.  Do not use Vaseline or neosporin on your incisions.  Only use soap and water on your incisions and then protect and keep dry.   Driving Restrictions    Complete by:  As directed    No driving for 2 weeks   Increase activity slowly    Complete by:  As directed    Lifting restrictions    Complete by:  As directed    No lifting for 4 weeks   Resume previous diet    Complete by:  As directed      Current Discharge Medication List    START taking these medications   Details  acetaminophen (TYLENOL) 325 MG tablet Take 2 tablets (650 mg total) by mouth every 6 (six) hours as needed for mild pain (or Fever >/= 101).    doxycycline (VIBRA-TABS) 100 MG tablet Take 1 tablet (100 mg total) by mouth every 12 (twelve) hours. For 3days Qty: 6 tablet, Refills: 0    HYDROcodone-acetaminophen (NORCO/VICODIN) 5-325 MG tablet Take 1-2 tablets by mouth every 6 (six) hours as needed for moderate pain or severe pain. Qty: 20 tablet, Refills: 0    metFORMIN (GLUCOPHAGE) 500 MG tablet Take 1 tablet (500 mg total) by mouth 2 (two) times daily with a meal. Qty: 60 tablet, Refills: 0    polyethylene glycol (MIRALAX / GLYCOLAX) packet Take 17 g by mouth daily as needed for mild constipation. Qty: 14 each, Refills: 0      CONTINUE these medications which have NOT CHANGED   Details  aspirin 81 MG tablet Take 81 mg by mouth daily.    atorvastatin (LIPITOR) 20 MG tablet Take 1 tablet (20 mg total) by mouth daily at 6 PM. Qty: 30 tablet, Refills: 3   Associated Diagnoses: Hypertriglyceridemia; Hyperlipidemia, unspecified hyperlipidemia type    buPROPion (WELLBUTRIN SR) 150 MG 12 hr tablet Take 150 mg by mouth daily.      diphenhydramine-acetaminophen (TYLENOL PM) 25-500 MG TABS tablet Take 2 tablets by mouth at bedtime as needed.    DULoxetine (CYMBALTA) 60 MG capsule Take 1 capsule (60 mg total) by mouth 2 (two) times daily. Qty: 60 capsule, Refills: 2   Associated Diagnoses: Neuropathy (HCC)    fenofibrate (TRICOR) 145 MG tablet Take 145 mg by mouth daily.     gabapentin (NEURONTIN) 600 MG tablet Take 600 mg by mouth daily.    hydrochlorothiazide (HYDRODIURIL) 25 MG tablet Take 1 tablet (25 mg total) by mouth daily. Qty: 30 tablet, Refills: 2   Associated Diagnoses: Essential hypertension    omega-3 acid ethyl esters (LOVAZA) 1 g capsule Take 2 capsules (2 g total) by mouth 2 (two) times daily. Qty: 360 capsule, Refills: 3  sertraline (ZOLOFT) 100 MG tablet Take 100 mg by mouth daily.       STOP taking these medications     olmesartan (BENICAR) 40 MG tablet      UNABLE TO FIND        Allergies  Allergen Reactions  . No Known Allergies    Follow-up Information    Early, Todd, MD Follow up in 2 week(s).   Specialties:  Vascular Surgery, Cardiology Why:  Office will call you to arrange your appt (sent) Contact information: [place] [address] [place], NP. Schedule an appointment as soon as possible for a visit in 1 week(s).   Specialty:  Internal Medicine Why:  Diabetes FU Contact information: [address] [place] [address] [phone]            The results of significant diagnostics from this hospitalization (including imaging, microbiology, ancillary and laboratory) are listed below for reference.    Significant Diagnostic Studies: Mr Foot Left Wo Contrast  Result Date: 12/13/2016 CLINICAL DATA:  Nonhealing ulcer on the third toe of the left foot. EXAM: MRI OF THE LEFT FOOT WITHOUT CONTRAST TECHNIQUE: Multiplanar, multisequence MR imaging of the left forefoot was performed. No intravenous contrast was administered.  COMPARISON:  Radiographs dated 12/12/2016 FINDINGS: Bones/Joint/Cartilage No osteomyelitis, bone edema, or other significant abnormalities. Ligaments Normal. Muscles and Tendons Normal. Soft tissues Normal. IMPRESSION: Normal MRI of the left forefoot. Specifically, no evidence of osteomyelitis or abscess of the third toe. Electronically Signed   By: Lorriane Shire M.D.   On: 12/13/2016 07:53   Dg Foot Complete Left  Result Date: 12/12/2016 CLINICAL DATA:  Ulcer along dorsum of third toe. EXAM: LEFT FOOT - COMPLETE 3+ VIEW COMPARISON:  None. FINDINGS: No fractures or dislocations. No soft tissue gas identified. The distal tuft of the third toe is not well evaluated but there is no definitive erosion in this region. The remainder of the third digit is well seen with no abnormality. IMPRESSION: The distal tuft of the third toe is not well seen on this study. The remainder of the third toe is normal in appearance. Electronically Signed   By: Dorise Bullion III M.D   On: 12/12/2016 14:16    Microbiology: Recent Results (from the past 240 hour(s))  Culture, blood (Routine X 2) w Reflex to ID Panel     Status: None   Collection Time: 12/12/16  8:10 PM  Result Value Ref Range Status   Specimen Description BLOOD LEFT ANTECUBITAL  Final   Special Requests BOTTLES DRAWN AEROBIC AND ANAEROBIC 10CC EA  Final   Culture NO GROWTH 5 DAYS  Final   Report Status 12/18/2016 FINAL  Final  Culture, blood (Routine X 2) w Reflex to ID Panel     Status: None   Collection Time: 12/12/16  8:10 PM  Result Value Ref Range Status   Specimen Description BLOOD LEFT FOREARM  Final   Special Requests BOTTLES DRAWN AEROBIC AND ANAEROBIC 10CC EA  Final   Culture NO GROWTH 5 DAYS  Final   Report Status 12/18/2016 FINAL  Final  Urine culture     Status: Abnormal   Collection Time: 12/13/16  1:59 AM  Result Value Ref Range Status   Specimen Description URINE, RANDOM  Final   Special Requests NONE  Final   Culture MULTIPLE  SPECIES PRESENT, SUGGEST RECOLLECTION (A)  Final   Report Status 12/14/2016 FINAL  Final  Surgical pcr screen     Status: None   Collection Time: 12/17/16  4:48 AM  Result Value Ref Range Status   MRSA, PCR NEGATIVE NEGATIVE Final   Staphylococcus aureus NEGATIVE NEGATIVE Final    Comment:        The Xpert SA Assay (FDA approved for NASAL specimens in patients over 72 years of age), is one component of a comprehensive surveillance program.  Test performance has been validated by [facility] for patients greater than or equal to 58 year old. It is not intended to diagnose infection nor to guide or monitor treatment.      Labs: Basic Metabolic Panel:  Recent Labs Lab 12/14/16 0515 12/15/16 0342 12/17/16 0440 12/18/16 0331 12/19/16 0407  NA 138 137 137 137 135  K 3.8 3.5 3.3* 3.5 3.0*  CL 109 107 106 106 104  CO2 22 25 23 24 23   GLUCOSE 155* 169* 134* 144* 136*  BUN 19 13 13 11 17   CREATININE 1.37* 1.20 1.13 1.09 1.14  CALCIUM 8.7* 8.3* 8.6* 8.2* 8.3*   Liver Function Tests:  Recent Labs Lab 12/12/16 1425  AST 23  ALT 19  ALKPHOS 48  BILITOT 0.7  PROT 7.6  ALBUMIN 3.7   No results for input(s): LIPASE, AMYLASE in the last 168 hours. No results for input(s): AMMONIA in the last 168 hours. CBC:  Recent Labs Lab 12/12/16 1425 12/13/16 0510 12/15/16 0342 12/17/16 0440 12/18/16 0331 12/19/16 0407  WBC 11.3* 9.7 8.9 10.3 11.2* 11.1*  NEUTROABS 7.5  --   --   --   --   --   HGB 15.6 12.9* 11.4* 12.2* 11.9* 11.4*  HCT 45.1 39.0 34.4* 36.8* 35.6* 34.5*  MCV 90.7 92.6 90.8 91.1 91.3 92.0  PLT 295 240 260 262 238 261   Cardiac Enzymes: No results for input(s): CKTOTAL, CKMB, CKMBINDEX, TROPONINI in the last 168 hours. BNP: BNP (last 3 results) No results for input(s): BNP in the last 8760 hours.  ProBNP (last 3 results) No results for input(s): PROBNP in the last 8760 hours.  CBG: No results for input(s): GLUCAP in the last 168  hours.     SignedDomenic Polite MD.  [facility] 12/19/2016, 1:24 PM

## 2016-12-20 ENCOUNTER — Encounter (HOSPITAL_COMMUNITY): Payer: Self-pay | Admitting: Vascular Surgery

## 2016-12-20 ENCOUNTER — Encounter: Payer: Self-pay | Admitting: Nurse Practitioner

## 2016-12-20 NOTE — Progress Notes (Signed)
Abstracted result and sent to scan  

## 2016-12-21 ENCOUNTER — Ambulatory Visit: Payer: Commercial Managed Care - HMO | Admitting: Cardiology

## 2016-12-28 ENCOUNTER — Encounter: Payer: Self-pay | Admitting: Vascular Surgery

## 2016-12-29 ENCOUNTER — Telehealth: Payer: Self-pay

## 2016-12-29 NOTE — Telephone Encounter (Signed)
Phone call from pt's niece, Sharyn Lull, to discuss pt's symptoms.  Received verbal okay, per phone, from pt. @ this time, to speak with his niece.  Niece reported that the sore on 3rd toe, left foot, has improved since surgery, but that it has been slow to heal.  Reported the ulcer depth has gotten better, and has been draining a "very small amt. of yellow drainage."  Stated the surrounding tissue is still a little red, but she feels this is better.  Reported the left LE incisions are intact, and no signs of redness, warmth or drainage.  Denied any fever/ chills.  Stated she has been cleansing the incisions and sore of left 3rd toe, with Dial soap daily.  Has been placing a gauze bandage over the toe ulcer.  Advised to continue with daily incision and wound care as before.  Advised against submerging the incisions of left LE in bathtub, but okay to shower or to take sponge bath.   Encouraged to keep the toe ulcer area clean and dry, changing the dressing as needed.  Encouraged to call office if signs of infection; increased redness/ warmth, worsening drainage, fever/ chills, or any other concerns.  Reminded of appt. 01/04/17 @ 8:45 AM, with Dr. Donnetta Hutching.  Niece verb. Understanding.

## 2017-01-04 ENCOUNTER — Encounter: Payer: Commercial Managed Care - HMO | Admitting: Vascular Surgery

## 2017-01-11 ENCOUNTER — Encounter: Payer: Self-pay | Admitting: Vascular Surgery

## 2017-01-12 ENCOUNTER — Ambulatory Visit: Payer: Commercial Managed Care - HMO | Admitting: Physical Therapy

## 2017-01-18 ENCOUNTER — Encounter: Payer: Medicare HMO | Admitting: Vascular Surgery

## 2017-01-31 ENCOUNTER — Ambulatory Visit: Payer: Medicare HMO | Admitting: Physical Medicine & Rehabilitation

## 2017-01-31 ENCOUNTER — Encounter: Payer: Medicare HMO | Attending: Physical Medicine & Rehabilitation

## 2017-02-15 ENCOUNTER — Telehealth: Payer: Self-pay | Admitting: Nurse Practitioner

## 2017-02-15 DIAGNOSIS — F32A Depression, unspecified: Secondary | ICD-10-CM | POA: Insufficient documentation

## 2017-02-15 DIAGNOSIS — F329 Major depressive disorder, single episode, unspecified: Secondary | ICD-10-CM | POA: Insufficient documentation

## 2017-02-15 DIAGNOSIS — F419 Anxiety disorder, unspecified: Principal | ICD-10-CM

## 2017-02-15 MED ORDER — BUPROPION HCL ER (SR) 150 MG PO TB12: 150.0000 mg | ORAL_TABLET | Freq: Every day | ORAL | 0 refills | Status: DC

## 2017-02-15 MED ORDER — SERTRALINE HCL 100 MG PO TABS: 100.0000 mg | ORAL_TABLET | Freq: Every day | ORAL | 0 refills | Status: DC

## 2017-02-15 NOTE — Telephone Encounter (Signed)
Pt request refill for Sertraline 100mg  HDl. Please advise, we never send in this med for pt before.

## 2017-02-23 ENCOUNTER — Ambulatory Visit: Payer: Medicare HMO | Attending: Internal Medicine | Admitting: Physical Therapy

## 2017-03-08 ENCOUNTER — Telehealth: Payer: Self-pay | Admitting: Nurse Practitioner

## 2017-03-08 DIAGNOSIS — I10 Essential (primary) hypertension: Secondary | ICD-10-CM

## 2017-03-08 NOTE — Telephone Encounter (Signed)
Please advise, do not see we sent in last time. Pt has appt with you on 05/03/17.

## 2017-03-08 NOTE — Telephone Encounter (Signed)
Needs these meds sent to [facility] in [place] on [address]. fenofibrate (TRICOR) hydrochlorothiazide (HYDRODIURIL)  metFORMIN (GLUCOPHAGE) has no more refills please refill

## 2017-03-09 MED ORDER — HYDROCHLOROTHIAZIDE 25 MG PO TABS: 25.0000 mg | ORAL_TABLET | Freq: Every day | ORAL | 0 refills | Status: DC

## 2017-03-09 MED ORDER — FENOFIBRATE 145 MG PO TABS: 145.0000 mg | ORAL_TABLET | Freq: Every day | ORAL | 0 refills | Status: DC

## 2017-03-09 MED ORDER — METFORMIN HCL 500 MG PO TABS: 500.0000 mg | ORAL_TABLET | Freq: Two times a day (BID) | ORAL | 0 refills | Status: DC

## 2017-03-09 MED ORDER — POTASSIUM CHLORIDE CRYS ER 20 MEQ PO TBCR: 20.0000 meq | EXTENDED_RELEASE_TABLET | Freq: Every day | ORAL | 0 refills | Status: DC

## 2017-03-09 NOTE — Telephone Encounter (Signed)
Left detail massage inform pt that rx sent and [place] wants him to take potassium chloride  rx sent.

## 2017-03-09 NOTE — Telephone Encounter (Signed)
Ok to sent x60days, no refills Also needs to sent potassium chloride oral 80mEq 1tab daily x 60days, no refills

## 2017-03-14 ENCOUNTER — Telehealth: Payer: Self-pay | Admitting: Nurse Practitioner

## 2017-03-14 NOTE — Telephone Encounter (Signed)
Please advise, pt has appt with you in 04/2017 and last K+ 3.0 in January 2018.

## 2017-03-14 NOTE — Telephone Encounter (Signed)
Will repeat BMP during next OV.

## 2017-03-14 NOTE — Telephone Encounter (Signed)
Daughter verbalized understand.

## 2017-03-14 NOTE — Telephone Encounter (Signed)
Niece is concerned about the potassium rx without having labs done since January, states too much potassium is bad. Please advise.

## 2017-03-29 ENCOUNTER — Telehealth: Payer: Self-pay | Admitting: Nurse Practitioner

## 2017-03-29 MED ORDER — METFORMIN HCL 500 MG PO TABS: 500.0000 mg | ORAL_TABLET | Freq: Two times a day (BID) | ORAL | 0 refills | Status: DC

## 2017-03-29 NOTE — Telephone Encounter (Signed)
rx sent

## 2017-04-13 ENCOUNTER — Other Ambulatory Visit: Payer: Self-pay | Admitting: *Deleted

## 2017-04-21 ENCOUNTER — Other Ambulatory Visit: Payer: Self-pay | Admitting: General Practice

## 2017-04-21 MED ORDER — FENOFIBRATE 145 MG PO TABS: 145.0000 mg | ORAL_TABLET | Freq: Every day | ORAL | 0 refills | Status: DC

## 2017-05-03 ENCOUNTER — Ambulatory Visit: Payer: Medicare HMO | Admitting: Nurse Practitioner

## 2017-05-03 ENCOUNTER — Telehealth: Payer: Self-pay | Admitting: Nurse Practitioner

## 2017-05-03 DIAGNOSIS — G629 Polyneuropathy, unspecified: Secondary | ICD-10-CM

## 2017-05-03 DIAGNOSIS — I739 Peripheral vascular disease, unspecified: Secondary | ICD-10-CM

## 2017-05-03 DIAGNOSIS — G546 Phantom limb syndrome with pain: Secondary | ICD-10-CM

## 2017-05-03 DIAGNOSIS — Z89511 Acquired absence of right leg below knee: Secondary | ICD-10-CM

## 2017-05-03 NOTE — Telephone Encounter (Signed)
[facility] please help.

## 2017-05-03 NOTE — Telephone Encounter (Signed)
Can you please put a referral in for this. thanks

## 2017-05-03 NOTE — Telephone Encounter (Signed)
Would like to know if they can get their pain management referral sent tp [place] to [facility] on [id number]  Ph # [phone]

## 2017-05-04 NOTE — Telephone Encounter (Signed)
Ok to put in referral? Please advise, last ov was 10/2016

## 2017-05-04 NOTE — Telephone Encounter (Signed)
[name] called [facility] and they are requesting more current records. I left msg on pts vm to call back to schedule an appointment.

## 2017-05-12 ENCOUNTER — Telehealth: Payer: Self-pay | Admitting: Nurse Practitioner

## 2017-05-12 ENCOUNTER — Ambulatory Visit (INDEPENDENT_AMBULATORY_CARE_PROVIDER_SITE_OTHER): Payer: Medicare HMO | Admitting: Nurse Practitioner

## 2017-05-12 ENCOUNTER — Other Ambulatory Visit (INDEPENDENT_AMBULATORY_CARE_PROVIDER_SITE_OTHER): Payer: Medicare HMO

## 2017-05-12 ENCOUNTER — Encounter: Payer: Self-pay | Admitting: Nurse Practitioner

## 2017-05-12 VITALS — BP 120/86 | HR 84 | Temp 97.7°F | Ht 73.0 in | Wt 169.0 lb

## 2017-05-12 DIAGNOSIS — E0865 Diabetes mellitus due to underlying condition with hyperglycemia: Secondary | ICD-10-CM

## 2017-05-12 DIAGNOSIS — F329 Major depressive disorder, single episode, unspecified: Secondary | ICD-10-CM

## 2017-05-12 DIAGNOSIS — I10 Essential (primary) hypertension: Secondary | ICD-10-CM | POA: Diagnosis not present

## 2017-05-12 DIAGNOSIS — G546 Phantom limb syndrome with pain: Secondary | ICD-10-CM

## 2017-05-12 DIAGNOSIS — IMO0002 Reserved for concepts with insufficient information to code with codable children: Secondary | ICD-10-CM

## 2017-05-12 DIAGNOSIS — E0842 Diabetes mellitus due to underlying condition with diabetic polyneuropathy: Secondary | ICD-10-CM

## 2017-05-12 DIAGNOSIS — F419 Anxiety disorder, unspecified: Secondary | ICD-10-CM

## 2017-05-12 DIAGNOSIS — E781 Pure hyperglyceridemia: Secondary | ICD-10-CM | POA: Diagnosis not present

## 2017-05-12 DIAGNOSIS — E782 Mixed hyperlipidemia: Secondary | ICD-10-CM | POA: Diagnosis not present

## 2017-05-12 DIAGNOSIS — I739 Peripheral vascular disease, unspecified: Secondary | ICD-10-CM | POA: Diagnosis not present

## 2017-05-12 DIAGNOSIS — F32A Depression, unspecified: Secondary | ICD-10-CM

## 2017-05-12 DIAGNOSIS — E785 Hyperlipidemia, unspecified: Secondary | ICD-10-CM

## 2017-05-12 DIAGNOSIS — G629 Polyneuropathy, unspecified: Secondary | ICD-10-CM | POA: Diagnosis not present

## 2017-05-12 LAB — COMPREHENSIVE METABOLIC PANEL
ALT: 29 U/L (ref 0–53)
AST: 27 U/L (ref 0–37)
Albumin: 4.3 g/dL (ref 3.5–5.2)
Alkaline Phosphatase: 61 U/L (ref 39–117)
BILIRUBIN TOTAL: 0.8 mg/dL (ref 0.2–1.2)
BUN: 31 mg/dL — ABNORMAL HIGH (ref 6–23)
CALCIUM: 9.8 mg/dL (ref 8.4–10.5)
CO2: 27 meq/L (ref 19–32)
Chloride: 102 mEq/L (ref 96–112)
Creatinine, Ser: 1.44 mg/dL (ref 0.40–1.50)
GFR: 52.36 mL/min — AB (ref >60.00)
Glucose, Bld: 281 mg/dL — ABNORMAL HIGH (ref 70–99)
Potassium: 4.1 mEq/L (ref 3.5–5.1)
Sodium: 136 mEq/L (ref 135–145)
Total Protein: 8 g/dL (ref 6.0–8.3)

## 2017-05-12 LAB — LDL CHOLESTEROL, DIRECT: Direct LDL: 93 mg/dL

## 2017-05-12 LAB — LIPID PANEL
CHOLESTEROL: 229 mg/dL — AB (ref 0–200)
HDL: 27.9 mg/dL — AB (ref >39.00)
Total CHOL/HDL Ratio: 8

## 2017-05-12 MED ORDER — PREGABALIN 75 MG PO CAPS: 75.0000 mg | ORAL_CAPSULE | Freq: Two times a day (BID) | ORAL | 0 refills | Status: DC

## 2017-05-12 MED ORDER — OLMESARTAN MEDOXOMIL 40 MG PO TABS: 40.0000 mg | ORAL_TABLET | Freq: Every day | ORAL | 1 refills | Status: DC

## 2017-05-12 MED ORDER — AMLODIPINE BESYLATE 5 MG PO TABS: 5.0000 mg | ORAL_TABLET | Freq: Every day | ORAL | 1 refills | Status: DC

## 2017-05-12 MED ORDER — BLOOD GLUCOSE MONITOR KIT: PACK | 2 refills | Status: DC

## 2017-05-12 MED ORDER — SERTRALINE HCL 100 MG PO TABS: 100.0000 mg | ORAL_TABLET | Freq: Every day | ORAL | 1 refills | Status: DC

## 2017-05-12 MED ORDER — METFORMIN HCL 500 MG PO TABS: 500.0000 mg | ORAL_TABLET | Freq: Two times a day (BID) | ORAL | 0 refills | Status: DC

## 2017-05-12 NOTE — Progress Notes (Signed)
Subjective:  Patient ID: Jordan Arellano, male    DOB: [birth year]  Age: 66 y.o. MRN: [id number]  CC: Follow-up (follow up--referral to pain clinic?--didnt get to go last referral/ follow on lab work CBC from hospital--it was low and lipid)   HPI Neuropathy: Persistent pain. No improvement with cymbalta and gabapentin. 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 Waiting for appt with pain clinic. He refused to follow up on obtaining wheelchair Continuous tobacco use.  Depression: Related to uncontrolled leg pain. No improvement of mood with Wellbutrin or zoloft. No SI or HI.  DM: Did not take metformin as prescribed. Does not check glucose at home.  Outpatient Medications Prior to Visit  Medication Sig Dispense Refill  . aspirin 81 MG tablet Take 81 mg by mouth daily.    . fenofibrate (TRICOR) 145 MG tablet Take 1 tablet (145 mg total) by mouth daily. 90 tablet 0  . omega-3 acid ethyl esters (LOVAZA) 1 g capsule Take 2 capsules (2 g total) by mouth 2 (two) times daily. 360 capsule 3  . atorvastatin (LIPITOR) 20 MG tablet Take 1 tablet (20 mg total) by mouth daily at 6 PM. 30 tablet 3  . hydrochlorothiazide (HYDRODIURIL) 25 MG tablet Take 1 tablet (25 mg total) by mouth daily. 60 tablet 0  . sertraline (ZOLOFT) 100 MG tablet Take 1 tablet (100 mg total) by mouth daily. 90 tablet 0  . diphenhydramine-acetaminophen (TYLENOL PM) 25-500 MG TABS tablet Take 2 tablets by mouth at bedtime as needed.    Marland Kitchen acetaminophen (TYLENOL) 325 MG tablet Take 2 tablets (650 mg total) by mouth every 6 (six) hours as needed for mild pain (or Fever >/= 101). (Patient not taking: Reported on 05/12/2017)    . buPROPion (WELLBUTRIN SR) 150 MG 12 hr tablet Take 1 tablet (150 mg total) by mouth daily. (Patient not taking: Reported on 05/12/2017) 90 tablet 0  . doxycycline (VIBRA-TABS) 100 MG tablet Take 1 tablet (100 mg total) by mouth every 12 (twelve) hours. For 3days  (Patient not taking: Reported on 05/12/2017) 6 tablet 0  . DULoxetine (CYMBALTA) 60 MG capsule Take 1 capsule (60 mg total) by mouth 2 (two) times daily. (Patient not taking: Reported on 05/12/2017) 60 capsule 2  . gabapentin (NEURONTIN) 600 MG tablet Take 600 mg by mouth daily.    Marland Kitchen HYDROcodone-acetaminophen (NORCO/VICODIN) 5-325 MG tablet Take 1-2 tablets by mouth every 6 (six) hours as needed for moderate pain or severe pain. (Patient not taking: Reported on 05/12/2017) 20 tablet 0  . metFORMIN (GLUCOPHAGE) 500 MG tablet Take 1 tablet (500 mg total) by mouth 2 (two) times daily with a meal. (Patient not taking: Reported on 05/12/2017) 180 tablet 0  . polyethylene glycol (MIRALAX / GLYCOLAX) packet Take 17 g by mouth daily as needed for mild constipation. (Patient not taking: Reported on 05/12/2017) 14 each 0  . potassium chloride SA (K-DUR,KLOR-CON) 20 MEQ tablet Take 1 tablet (20 mEq total) by mouth daily. (Patient not taking: Reported on 05/12/2017) 60 tablet 0   No facility-administered medications prior to visit.     ROS Review of Systems  Constitutional: Negative for malaise/fatigue.  Respiratory: Negative for cough, sputum production and shortness of breath.   Cardiovascular: Positive for claudication. Negative for chest pain, palpitations and leg swelling.  Gastrointestinal: Negative for abdominal pain, constipation, diarrhea, nausea and vomiting.  Musculoskeletal: Positive for joint pain. Negative for falls.  Skin: Negative.   Neurological: Negative for dizziness, focal  weakness and headaches.  Psychiatric/Behavioral: Positive for depression. Negative for hallucinations, substance abuse and suicidal ideas. The patient has insomnia. The patient is not nervous/anxious.      Objective:  BP 120/86   Pulse 84   Temp 97.7 F (36.5 C)   Ht 6' 1" (1.854 m)   Wt 169 lb (76.7 kg)   SpO2 99%   BMI 22.30 kg/m   BP Readings from Last 3 Encounters:  05/12/17 120/86  12/19/16 125/71  11/03/16  120/64    Wt Readings from Last 3 Encounters:  05/12/17 169 lb (76.7 kg)  12/17/16 169 lb 8.5 oz (76.9 kg)  11/03/16 174 lb (78.9 kg)    Physical Exam  Constitutional: He is oriented to person, place, and time. No distress.  Neck: Normal range of motion. Neck supple.  Cardiovascular: Normal rate, regular rhythm and normal heart sounds.   Pulmonary/Chest: Effort normal and breath sounds normal.  Musculoskeletal: He exhibits no edema.  Neurological: He is alert and oriented to person, place, and time.  Skin:  Left medial knee incision dry and intact. Well approximated.  Vitals reviewed.   Lab Results  Component Value Date   WBC 11.1 (H) 12/19/2016   HGB 11.4 (L) 12/19/2016   HCT 34.5 (L) 12/19/2016   PLT 261 12/19/2016   GLUCOSE 281 (H) 05/12/2017   CHOL 229 (H) 05/12/2017   TRIG (H) 05/12/2017    662.0 Triglyceride is over 400; calculations on Lipids are invalid.   HDL 27.90 (L) 05/12/2017   LDLDIRECT 93.0 05/12/2017   LDLCALC UNABLE TO CALCULATE IF TRIGLYCERIDE OVER 400 mg/dL 03/27/2014   ALT 29 05/12/2017   AST 27 05/12/2017   NA 136 05/12/2017   K 4.1 05/12/2017   CL 102 05/12/2017   CREATININE 1.44 05/12/2017   BUN 31 (H) 05/12/2017   CO2 27 05/12/2017   TSH 2.91 02/03/2016   INR 1.08 12/14/2016   HGBA1C 8.3 (H) 12/14/2016   MICROALBUR normal 02/03/2016   MICROALBUR 150 02/03/2016    Mr Foot Left Wo Contrast  Result Date: 12/13/2016 CLINICAL DATA:  Nonhealing ulcer on the third toe of the left foot. EXAM: MRI OF THE LEFT FOOT WITHOUT CONTRAST TECHNIQUE: Multiplanar, multisequence MR imaging of the left forefoot was performed. No intravenous contrast was administered. COMPARISON:  Radiographs dated 12/12/2016 FINDINGS: Bones/Joint/Cartilage No osteomyelitis, bone edema, or other significant abnormalities. Ligaments Normal. Muscles and Tendons Normal. Soft tissues Normal. IMPRESSION: Normal MRI of the left forefoot. Specifically, no evidence of osteomyelitis or  abscess of the third toe. Electronically Signed   By: Lorriane Shire M.D.   On: 12/13/2016 07:53   Dg Foot Complete Left  Result Date: 12/12/2016 CLINICAL DATA:  Ulcer along dorsum of third toe. EXAM: LEFT FOOT - COMPLETE 3+ VIEW COMPARISON:  None. FINDINGS: No fractures or dislocations. No soft tissue gas identified. The distal tuft of the third toe is not well evaluated but there is no definitive erosion in this region. The remainder of the third digit is well seen with no abnormality. IMPRESSION: The distal tuft of the third toe is not well seen on this study. The remainder of the third toe is normal in appearance. Electronically Signed   By: Dorise Bullion III M.D   On: 12/12/2016 14:16    Assessment & Plan:   Ariston was seen today for follow-up.  Diagnoses and all orders for this visit:  Essential hypertension -     Comprehensive metabolic panel; Future -     amLODipine (  NORVASC) 5 MG tablet; Take 1 tablet (5 mg total) by mouth daily. -     olmesartan (BENICAR) 40 MG tablet; Take 1 tablet (40 mg total) by mouth daily. -     hydrochlorothiazide (HYDRODIURIL) 12.5 MG tablet; Take 1 tablet (12.5 mg total) by mouth daily. -     potassium chloride SA (K-DUR,KLOR-CON) 10 MEQ tablet; Take 1 tablet (10 mEq total) by mouth daily.  Anxiety and depression -     sertraline (ZOLOFT) 100 MG tablet; Take 1 tablet (100 mg total) by mouth daily.  Diabetes mellitus due to underlying condition, uncontrolled, with diabetic polyneuropathy, without long-term current use of insulin (HCC) -     metFORMIN (GLUCOPHAGE) 500 MG tablet; Take 1 tablet (500 mg total) by mouth 2 (two) times daily with a meal. -     blood glucose meter kit and supplies KIT; Dispense based on patient and insurance preference. Use two times daily as directed (before breakfast and at bedtime. (FOR ICD-10: E08.42)  Phantom pain after amputation of lower extremity (HCC) -     pregabalin (LYRICA) 75 MG capsule; Take 1 capsule (75 mg  total) by mouth 2 (two) times daily.  Neuropathy -     pregabalin (LYRICA) 75 MG capsule; Take 1 capsule (75 mg total) by mouth 2 (two) times daily.  Vascular disease, peripheral ([place])  Mixed hyperlipidemia -     Lipid panel; Future  Hypertriglyceridemia -     atorvastatin (LIPITOR) 40 MG tablet; Take 1 tablet (40 mg total) by mouth daily at 6 PM.  Hyperlipidemia, unspecified hyperlipidemia type -     atorvastatin (LIPITOR) 40 MG tablet; Take 1 tablet (40 mg total) by mouth daily at 6 PM.   I have discontinued Mr. Croswell DULoxetine, gabapentin, acetaminophen, doxycycline, HYDROcodone-acetaminophen, polyethylene glycol, and buPROPion. I have changed his potassium chloride SA to potassium chloride. I have also changed his amLODipine, olmesartan, atorvastatin, and hydrochlorothiazide. Additionally, I am having him start on pregabalin and blood glucose meter kit and supplies. Lastly, I am having him maintain his aspirin, omega-3 acid ethyl esters, diphenhydramine-acetaminophen, fenofibrate, sertraline, and metFORMIN.  Meds ordered this encounter  Medications  . DISCONTD: amLODipine (NORVASC) 5 MG tablet    Sig: Take 5 mg by mouth daily.  Marland Kitchen DISCONTD: olmesartan (BENICAR) 40 MG tablet    Sig: Take 40 mg by mouth daily.  Marland Kitchen amLODipine (NORVASC) 5 MG tablet    Sig: Take 1 tablet (5 mg total) by mouth daily.    Dispense:  90 tablet    Refill:  1    Order Specific Question:   Supervising Provider    Answer:   Cassandria Anger [[id number]]  . olmesartan (BENICAR) 40 MG tablet    Sig: Take 1 tablet (40 mg total) by mouth daily.    Dispense:  90 tablet    Refill:  1    Order Specific Question:   Supervising Provider    Answer:   Cassandria Anger [[id number]]  . sertraline (ZOLOFT) 100 MG tablet    Sig: Take 1 tablet (100 mg total) by mouth daily.    Dispense:  90 tablet    Refill:  1    Order Specific Question:   Supervising Provider    Answer:   Cassandria Anger [[id number]]  . metFORMIN  (GLUCOPHAGE) 500 MG tablet    Sig: Take 1 tablet (500 mg total) by mouth 2 (two) times daily with a meal.    Dispense:  180 tablet  Refill:  0    Order Specific Question:   Supervising Provider    Answer:   Cassandria Anger [[id number]]  . pregabalin (LYRICA) 75 MG capsule    Sig: Take 1 capsule (75 mg total) by mouth 2 (two) times daily.    Dispense:  60 capsule    Refill:  0    Order Specific Question:   Supervising Provider    Answer:   Cassandria Anger [[id number]]  . blood glucose meter kit and supplies KIT    Sig: Dispense based on patient and insurance preference. Use two times daily as directed (before breakfast and at bedtime. (FOR ICD-10: E08.42)    Dispense:  1 each    Refill:  2    Order Specific Question:   Supervising Provider    Answer:   Cassandria Anger [[id number]]    Order Specific Question:   Number of strips    Answer:   100    Order Specific Question:   Number of lancets    Answer:   100  . atorvastatin (LIPITOR) 40 MG tablet    Sig: Take 1 tablet (40 mg total) by mouth daily at 6 PM.    Dispense:  30 tablet    Refill:  3    Order Specific Question:   Supervising Provider    Answer:   Cassandria Anger [[id number]]  . hydrochlorothiazide (HYDRODIURIL) 12.5 MG tablet    Sig: Take 1 tablet (12.5 mg total) by mouth daily.    Dispense:  30 tablet    Refill:  3    Order Specific Question:   Supervising Provider    Answer:   Cassandria Anger [[id number]]  . potassium chloride SA (K-DUR,KLOR-CON) 10 MEQ tablet    Sig: Take 1 tablet (10 mEq total) by mouth daily.    Dispense:  30 tablet    Refill:  3    Order Specific Question:   Supervising Provider    Answer:   Cassandria Anger [[id number]]    Follow-up: Return in about 4 weeks (around 06/09/2017) for anxiety, depression, pain.  Wilfred Lacy, NP

## 2017-05-12 NOTE — Patient Instructions (Addendum)
Please sign medical release to get record [facility].  Go to basement for blood draw. You will be contacted with results.

## 2017-05-12 NOTE — Telephone Encounter (Signed)
We are waiting for the lab, once the lab comes back, we will send this in.

## 2017-05-12 NOTE — Telephone Encounter (Signed)
Pts water pill refill was supposed to be called today hydrochlorothiazide (HYDRODIURIL) 25 MG tablet   Please sent o [facility] in chart

## 2017-05-13 ENCOUNTER — Telehealth: Payer: Self-pay | Admitting: Nurse Practitioner

## 2017-05-13 DIAGNOSIS — E0865 Diabetes mellitus due to underlying condition with hyperglycemia: Principal | ICD-10-CM

## 2017-05-13 DIAGNOSIS — IMO0002 Reserved for concepts with insufficient information to code with codable children: Secondary | ICD-10-CM

## 2017-05-13 DIAGNOSIS — Z794 Long term (current) use of insulin: Secondary | ICD-10-CM

## 2017-05-13 DIAGNOSIS — E0842 Diabetes mellitus due to underlying condition with diabetic polyneuropathy: Secondary | ICD-10-CM

## 2017-05-13 MED ORDER — ATORVASTATIN CALCIUM 40 MG PO TABS: 40.0000 mg | ORAL_TABLET | Freq: Every day | ORAL | 3 refills | Status: DC

## 2017-05-13 MED ORDER — POTASSIUM CHLORIDE CRYS ER 10 MEQ PO TBCR: 10.0000 meq | EXTENDED_RELEASE_TABLET | Freq: Every day | ORAL | 3 refills | Status: DC

## 2017-05-13 MED ORDER — HYDROCHLOROTHIAZIDE 12.5 MG PO TABS: 12.5000 mg | ORAL_TABLET | Freq: Every day | ORAL | 3 refills | Status: DC

## 2017-05-13 MED ORDER — PEN NEEDLES 31G X 6 MM MISC: 1.0000 "application " | Freq: Every day | 2 refills | Status: DC

## 2017-05-13 MED ORDER — INSULIN GLARGINE 100 UNITS/ML SOLOSTAR PEN: 15.0000 [IU] | PEN_INJECTOR | Freq: Every day | SUBCUTANEOUS | 1 refills | Status: DC

## 2017-05-13 NOTE — Telephone Encounter (Signed)
Spoke with Sharyn Lull, she was informed of the notes. She understood. She will pick up rx at [facility].

## 2017-05-13 NOTE — Telephone Encounter (Signed)
Pts niece called stating that the pts sugar is running very high (in the 400s). Please advise. Sharyn Lull can be reached at [phone]

## 2017-05-13 NOTE — Telephone Encounter (Signed)
Sharyn Lull is calling again about this. She was informed that the nurse will give her a call back.

## 2017-05-18 ENCOUNTER — Telehealth: Payer: Self-pay | Admitting: Nurse Practitioner

## 2017-05-18 DIAGNOSIS — E781 Pure hyperglyceridemia: Secondary | ICD-10-CM

## 2017-05-18 DIAGNOSIS — E785 Hyperlipidemia, unspecified: Secondary | ICD-10-CM

## 2017-05-18 MED ORDER — ATORVASTATIN CALCIUM 40 MG PO TABS: 40.0000 mg | ORAL_TABLET | Freq: Every day | ORAL | 0 refills | Status: DC

## 2017-05-18 NOTE — Telephone Encounter (Signed)
Received rx request 90 days supply for Atorvastatin 20 mg from [facility].   New rx sent in 90 day supply Atorvastatin 40 mg--inform pharmacy this was increase as of 05/13/2017.

## 2017-05-20 ENCOUNTER — Ambulatory Visit: Payer: Medicare HMO | Admitting: Nurse Practitioner

## 2017-06-09 ENCOUNTER — Ambulatory Visit: Payer: Medicare HMO | Admitting: Nurse Practitioner

## 2017-06-09 DIAGNOSIS — G894 Chronic pain syndrome: Secondary | ICD-10-CM | POA: Diagnosis not present

## 2017-06-09 DIAGNOSIS — G629 Polyneuropathy, unspecified: Secondary | ICD-10-CM | POA: Diagnosis not present

## 2017-06-09 DIAGNOSIS — Z8673 Personal history of transient ischemic attack (TIA), and cerebral infarction without residual deficits: Secondary | ICD-10-CM | POA: Diagnosis not present

## 2017-06-09 DIAGNOSIS — Z720 Tobacco use: Secondary | ICD-10-CM | POA: Diagnosis not present

## 2017-06-09 DIAGNOSIS — I739 Peripheral vascular disease, unspecified: Secondary | ICD-10-CM | POA: Diagnosis not present

## 2017-06-09 DIAGNOSIS — G546 Phantom limb syndrome with pain: Secondary | ICD-10-CM | POA: Diagnosis not present

## 2017-07-04 ENCOUNTER — Telehealth: Payer: Self-pay | Admitting: Nurse Practitioner

## 2017-07-04 DIAGNOSIS — E781 Pure hyperglyceridemia: Secondary | ICD-10-CM

## 2017-07-04 DIAGNOSIS — E785 Hyperlipidemia, unspecified: Secondary | ICD-10-CM

## 2017-07-04 MED ORDER — ATORVASTATIN CALCIUM 40 MG PO TABS: 40.0000 mg | ORAL_TABLET | Freq: Every day | ORAL | 0 refills | Status: DC

## 2017-07-04 NOTE — Telephone Encounter (Signed)
rx sent to [facility] as requested

## 2017-07-28 NOTE — Addendum Note (Signed)
Addendum  created 07/28/17 3435 by Roberts Gaudy, MD   Sign clinical note

## 2017-08-09 ENCOUNTER — Telehealth: Payer: Self-pay | Admitting: Nurse Practitioner

## 2017-08-09 DIAGNOSIS — E785 Hyperlipidemia, unspecified: Secondary | ICD-10-CM

## 2017-08-09 DIAGNOSIS — E781 Pure hyperglyceridemia: Secondary | ICD-10-CM

## 2017-08-09 MED ORDER — ATORVASTATIN CALCIUM 40 MG PO TABS: 40.0000 mg | ORAL_TABLET | Freq: Every day | ORAL | 0 refills | Status: DC

## 2017-08-09 NOTE — Telephone Encounter (Signed)
rx sent

## 2017-09-05 ENCOUNTER — Other Ambulatory Visit: Payer: Self-pay | Admitting: Nurse Practitioner

## 2017-09-05 DIAGNOSIS — I10 Essential (primary) hypertension: Secondary | ICD-10-CM

## 2017-09-30 ENCOUNTER — Inpatient Hospital Stay (HOSPITAL_COMMUNITY)
Admission: EM | Admit: 2017-09-30 | Discharge: 2017-10-04 | DRG: 272 | Disposition: A | Discharge status: Home / Self Care | Payer: Medicare HMO | Attending: Vascular Surgery | Admitting: Vascular Surgery

## 2017-09-30 ENCOUNTER — Emergency Department (HOSPITAL_COMMUNITY): Payer: Medicare HMO | Admitting: Certified Registered"

## 2017-09-30 ENCOUNTER — Encounter (HOSPITAL_COMMUNITY)
Admission: EM | Disposition: A | Discharge status: Home / Self Care | Payer: Self-pay | Source: Home / Self Care | Attending: Vascular Surgery

## 2017-09-30 ENCOUNTER — Emergency Department (HOSPITAL_COMMUNITY): Payer: Medicare HMO

## 2017-09-30 ENCOUNTER — Encounter (HOSPITAL_COMMUNITY): Payer: Self-pay | Admitting: Emergency Medicine

## 2017-09-30 DIAGNOSIS — R0989 Other specified symptoms and signs involving the circulatory and respiratory systems: Secondary | ICD-10-CM | POA: Diagnosis not present

## 2017-09-30 DIAGNOSIS — I739 Peripheral vascular disease, unspecified: Secondary | ICD-10-CM | POA: Diagnosis present

## 2017-09-30 DIAGNOSIS — Y838 Other surgical procedures as the cause of abnormal reaction of the patient, or of later complication, without mention of misadventure at the time of the procedure: Secondary | ICD-10-CM | POA: Diagnosis present

## 2017-09-30 DIAGNOSIS — Z8673 Personal history of transient ischemic attack (TIA), and cerebral infarction without residual deficits: Secondary | ICD-10-CM | POA: Diagnosis not present

## 2017-09-30 DIAGNOSIS — Z79899 Other long term (current) drug therapy: Secondary | ICD-10-CM

## 2017-09-30 DIAGNOSIS — Z794 Long term (current) use of insulin: Secondary | ICD-10-CM

## 2017-09-30 DIAGNOSIS — I959 Hypotension, unspecified: Secondary | ICD-10-CM | POA: Diagnosis not present

## 2017-09-30 DIAGNOSIS — E785 Hyperlipidemia, unspecified: Secondary | ICD-10-CM | POA: Diagnosis present

## 2017-09-30 DIAGNOSIS — Z89511 Acquired absence of right leg below knee: Secondary | ICD-10-CM | POA: Diagnosis not present

## 2017-09-30 DIAGNOSIS — Z01811 Encounter for preprocedural respiratory examination: Secondary | ICD-10-CM

## 2017-09-30 DIAGNOSIS — I70229 Atherosclerosis of native arteries of extremities with rest pain, unspecified extremity: Secondary | ICD-10-CM | POA: Diagnosis present

## 2017-09-30 DIAGNOSIS — T82868A Thrombosis of vascular prosthetic devices, implants and grafts, initial encounter: Secondary | ICD-10-CM | POA: Diagnosis not present

## 2017-09-30 DIAGNOSIS — F17210 Nicotine dependence, cigarettes, uncomplicated: Secondary | ICD-10-CM | POA: Diagnosis present

## 2017-09-30 DIAGNOSIS — Z8249 Family history of ischemic heart disease and other diseases of the circulatory system: Secondary | ICD-10-CM

## 2017-09-30 DIAGNOSIS — Z7982 Long term (current) use of aspirin: Secondary | ICD-10-CM

## 2017-09-30 DIAGNOSIS — I10 Essential (primary) hypertension: Secondary | ICD-10-CM | POA: Diagnosis not present

## 2017-09-30 DIAGNOSIS — Z01810 Encounter for preprocedural cardiovascular examination: Secondary | ICD-10-CM | POA: Diagnosis not present

## 2017-09-30 DIAGNOSIS — I998 Other disorder of circulatory system: Secondary | ICD-10-CM | POA: Diagnosis not present

## 2017-09-30 DIAGNOSIS — E1151 Type 2 diabetes mellitus with diabetic peripheral angiopathy without gangrene: Secondary | ICD-10-CM | POA: Diagnosis present

## 2017-09-30 DIAGNOSIS — F329 Major depressive disorder, single episode, unspecified: Secondary | ICD-10-CM | POA: Diagnosis present

## 2017-09-30 DIAGNOSIS — R52 Pain, unspecified: Secondary | ICD-10-CM | POA: Diagnosis not present

## 2017-09-30 DIAGNOSIS — Z95828 Presence of other vascular implants and grafts: Secondary | ICD-10-CM

## 2017-09-30 DIAGNOSIS — M79605 Pain in left leg: Secondary | ICD-10-CM | POA: Diagnosis not present

## 2017-09-30 DIAGNOSIS — R531 Weakness: Secondary | ICD-10-CM | POA: Diagnosis not present

## 2017-09-30 DIAGNOSIS — R200 Anesthesia of skin: Secondary | ICD-10-CM | POA: Diagnosis not present

## 2017-09-30 DIAGNOSIS — Z9889 Other specified postprocedural states: Secondary | ICD-10-CM | POA: Diagnosis not present

## 2017-09-30 HISTORY — PX: FEMORAL-POPLITEAL BYPASS GRAFT: SHX937

## 2017-09-30 LAB — I-STAT CHEM 8, ED
BUN: 22 mg/dL — ABNORMAL HIGH (ref 6–20)
CALCIUM ION: 1.12 mmol/L — AB (ref 1.15–1.40)
CHLORIDE: 105 mmol/L (ref 101–111)
Creatinine, Ser: 1.2 mg/dL (ref 0.61–1.24)
Glucose, Bld: 160 mg/dL — ABNORMAL HIGH (ref 65–99)
HCT: 41 % (ref 39.0–52.0)
HEMOGLOBIN: 13.9 g/dL (ref 13.0–17.0)
Potassium: 4.4 mmol/L (ref 3.5–5.1)
SODIUM: 140 mmol/L (ref 135–145)
TCO2: 23 mmol/L (ref 22–32)

## 2017-09-30 LAB — CBC WITH DIFFERENTIAL/PLATELET
BASOS ABS: 0 10*3/uL (ref 0.0–0.1)
BASOS PCT: 0 %
EOS PCT: 1 %
Eosinophils Absolute: 0.1 10*3/uL (ref 0.0–0.7)
HCT: 42.6 % (ref 39.0–52.0)
Hemoglobin: 14.2 g/dL (ref 13.0–17.0)
LYMPHS PCT: 10 %
Lymphs Abs: 1.5 10*3/uL (ref 0.7–4.0)
MCH: 30.9 pg (ref 26.0–34.0)
MCHC: 33.3 g/dL (ref 30.0–36.0)
MCV: 92.8 fL (ref 78.0–100.0)
Monocytes Absolute: 0.5 10*3/uL (ref 0.1–1.0)
Monocytes Relative: 3 %
Neutro Abs: 13.2 10*3/uL — ABNORMAL HIGH (ref 1.7–7.7)
Neutrophils Relative %: 86 %
PLATELETS: 219 10*3/uL (ref 150–400)
RBC: 4.59 MIL/uL (ref 4.22–5.81)
RDW: 14.8 % (ref 11.5–15.5)
WBC: 15.3 10*3/uL — ABNORMAL HIGH (ref 4.0–10.5)

## 2017-09-30 LAB — COMPREHENSIVE METABOLIC PANEL
ALT: 17 U/L (ref 17–63)
ANION GAP: 11 (ref 5–15)
AST: 21 U/L (ref 15–41)
Albumin: 3.4 g/dL — ABNORMAL LOW (ref 3.5–5.0)
Alkaline Phosphatase: 66 U/L (ref 38–126)
BILIRUBIN TOTAL: 0.8 mg/dL (ref 0.3–1.2)
BUN: 19 mg/dL (ref 6–20)
CO2: 22 mmol/L (ref 22–32)
Calcium: 8.8 mg/dL — ABNORMAL LOW (ref 8.9–10.3)
Chloride: 105 mmol/L (ref 101–111)
Creatinine, Ser: 1.31 mg/dL — ABNORMAL HIGH (ref 0.61–1.24)
GFR calc Af Amer: >60 mL/min (ref >60)
GFR, EST NON AFRICAN AMERICAN: 56 mL/min — AB (ref >60)
Glucose, Bld: 157 mg/dL — ABNORMAL HIGH (ref 65–99)
POTASSIUM: 4.4 mmol/L (ref 3.5–5.1)
Sodium: 138 mmol/L (ref 135–145)
TOTAL PROTEIN: 6.7 g/dL (ref 6.5–8.1)

## 2017-09-30 LAB — PROTIME-INR
INR: 1
PROTHROMBIN TIME: 13.1 s (ref 11.4–15.2)

## 2017-09-30 LAB — I-STAT CG4 LACTIC ACID, ED: LACTIC ACID, VENOUS: 1.49 mmol/L (ref 0.5–1.9)

## 2017-09-30 LAB — POCT ACTIVATED CLOTTING TIME: Activated Clotting Time: 142 seconds

## 2017-09-30 SURGERY — BYPASS GRAFT FEMORAL-POPLITEAL ARTERY
Anesthesia: General | Site: Leg Lower | Laterality: Left

## 2017-09-30 MED ORDER — LIDOCAINE 2% (20 MG/ML) 5 ML SYRINGE
INTRAMUSCULAR | Status: DC | PRN
  Administered 2017-09-30: 100 mg via INTRAVENOUS

## 2017-09-30 MED ORDER — SODIUM CHLORIDE 0.9 % IV SOLN
INTRAVENOUS | Status: DC | PRN
  Administered 2017-09-30: via INTRAVENOUS

## 2017-09-30 MED ORDER — DEXTROSE 5 % IV SOLN
INTRAVENOUS | Status: AC
  Filled 2017-09-30: qty 1.5

## 2017-09-30 MED ORDER — SODIUM CHLORIDE 0.9 % IJ SOLN
INTRAMUSCULAR | Status: DC | PRN
  Administered 2017-09-30: 49.7 mL via INTRAMUSCULAR

## 2017-09-30 MED ORDER — 0.9 % SODIUM CHLORIDE (POUR BTL) OPTIME
TOPICAL | Status: DC | PRN
  Administered 2017-09-30: 2000 mL

## 2017-09-30 MED ORDER — HEPARIN SODIUM (PORCINE) 1000 UNIT/ML IJ SOLN
INTRAMUSCULAR | Status: DC | PRN
  Administered 2017-09-30: 2000 [IU] via INTRAVENOUS
  Administered 2017-09-30: 7500 [IU] via INTRAVENOUS
  Administered 2017-09-30: 2000 [IU] via INTRAVENOUS

## 2017-09-30 MED ORDER — MIDAZOLAM HCL 2 MG/2ML IJ SOLN
INTRAMUSCULAR | Status: AC
  Filled 2017-09-30: qty 2

## 2017-09-30 MED ORDER — DEXAMETHASONE SODIUM PHOSPHATE 10 MG/ML IJ SOLN
INTRAMUSCULAR | Status: DC | PRN
  Administered 2017-09-30: 10 mg via INTRAVENOUS

## 2017-09-30 MED ORDER — SODIUM CHLORIDE 0.9 % IV SOLN
INTRAVENOUS | Status: DC | PRN
  Administered 2017-09-30: 21:00:00

## 2017-09-30 MED ORDER — DEXAMETHASONE SODIUM PHOSPHATE 10 MG/ML IJ SOLN
INTRAMUSCULAR | Status: AC
  Filled 2017-09-30: qty 1

## 2017-09-30 MED ORDER — SUFENTANIL CITRATE 50 MCG/ML IV SOLN
INTRAVENOUS | Status: DC | PRN
Start: 2017-09-30 — End: 2017-10-01
  Administered 2017-09-30: 30 ug via INTRAVENOUS
  Administered 2017-09-30 (×2): 5 ug via INTRAVENOUS
  Administered 2017-09-30: 10 ug via INTRAVENOUS

## 2017-09-30 MED ORDER — SODIUM CHLORIDE 0.9 % IJ SOLN
INTRAMUSCULAR | Status: AC
  Filled 2017-09-30: qty 10

## 2017-09-30 MED ORDER — PHENYLEPHRINE HCL 10 MG/ML IJ SOLN
INTRAVENOUS | Status: DC | PRN
  Administered 2017-09-30: 20 ug/min via INTRAVENOUS

## 2017-09-30 MED ORDER — PROPOFOL 10 MG/ML IV BOLUS
INTRAVENOUS | Status: AC
  Filled 2017-09-30: qty 20

## 2017-09-30 MED ORDER — ALBUMIN HUMAN 5 % IV SOLN
INTRAVENOUS | Status: DC | PRN
  Administered 2017-09-30 – 2017-10-01 (×2): via INTRAVENOUS

## 2017-09-30 MED ORDER — LACTATED RINGERS IV SOLN
INTRAVENOUS | Status: DC | PRN
  Administered 2017-09-30 (×2): via INTRAVENOUS

## 2017-09-30 MED ORDER — ONDANSETRON HCL 4 MG/2ML IJ SOLN
INTRAMUSCULAR | Status: AC
  Filled 2017-09-30: qty 2

## 2017-09-30 MED ORDER — MIDAZOLAM HCL 5 MG/5ML IJ SOLN
INTRAMUSCULAR | Status: DC | PRN
  Administered 2017-09-30: 2 mg via INTRAVENOUS

## 2017-09-30 MED ORDER — SODIUM CHLORIDE 0.9 % IV SOLN: INTRAVENOUS | Status: DC | PRN

## 2017-09-30 MED ORDER — FENTANYL CITRATE (PF) 100 MCG/2ML IJ SOLN
50.0000 ug | Freq: Once | INTRAMUSCULAR | Status: AC
  Administered 2017-09-30: 50 ug via INTRAVENOUS
  Filled 2017-09-30: qty 2

## 2017-09-30 MED ORDER — GLYCOPYRROLATE 0.2 MG/ML IJ SOLN
INTRAMUSCULAR | Status: DC | PRN
  Administered 2017-09-30: 0.2 mg via INTRAVENOUS

## 2017-09-30 MED ORDER — EPHEDRINE 5 MG/ML INJ
INTRAVENOUS | Status: AC
  Filled 2017-09-30: qty 10

## 2017-09-30 MED ORDER — ONDANSETRON HCL 4 MG/2ML IJ SOLN
4.0000 mg | INTRAMUSCULAR | Status: AC
  Administered 2017-09-30: 4 mg via INTRAVENOUS
  Filled 2017-09-30: qty 2

## 2017-09-30 MED ORDER — DEXTROSE 5 % IV SOLN
INTRAVENOUS | Status: DC | PRN
  Administered 2017-09-30: 1.5 g via INTRAVENOUS
  Administered 2017-10-01: .75 g via INTRAVENOUS

## 2017-09-30 MED ORDER — LIDOCAINE 2% (20 MG/ML) 5 ML SYRINGE
INTRAMUSCULAR | Status: AC
  Filled 2017-09-30: qty 5

## 2017-09-30 MED ORDER — PROPOFOL 10 MG/ML IV BOLUS
INTRAVENOUS | Status: DC | PRN
  Administered 2017-09-30: 30 mg via INTRAVENOUS
  Administered 2017-09-30: 130 mg via INTRAVENOUS

## 2017-09-30 MED ORDER — EPHEDRINE SULFATE 50 MG/ML IJ SOLN
INTRAMUSCULAR | Status: DC | PRN
  Administered 2017-09-30 (×3): 10 mg via INTRAVENOUS

## 2017-09-30 MED ORDER — HEPARIN SODIUM (PORCINE) 1000 UNIT/ML IJ SOLN
INTRAMUSCULAR | Status: AC
  Filled 2017-09-30: qty 1

## 2017-09-30 MED ORDER — SUCCINYLCHOLINE CHLORIDE 200 MG/10ML IV SOSY
PREFILLED_SYRINGE | INTRAVENOUS | Status: AC
  Filled 2017-09-30: qty 10

## 2017-09-30 MED ORDER — SUFENTANIL CITRATE 50 MCG/ML IV SOLN
INTRAVENOUS | Status: AC
  Filled 2017-09-30: qty 1

## 2017-09-30 MED ORDER — SUCCINYLCHOLINE CHLORIDE 200 MG/10ML IV SOSY
PREFILLED_SYRINGE | INTRAVENOUS | Status: DC | PRN
  Administered 2017-09-30: 120 mg via INTRAVENOUS

## 2017-09-30 MED ORDER — HEPARIN BOLUS VIA INFUSION
4000.0000 [IU] | Freq: Once | INTRAVENOUS | Status: AC
  Administered 2017-09-30: 4000 [IU] via INTRAVENOUS
  Filled 2017-09-30: qty 4000

## 2017-09-30 MED ORDER — SODIUM CHLORIDE 0.9 % IV BOLUS (SEPSIS)
1000.0000 mL | Freq: Once | INTRAVENOUS | Status: AC
  Administered 2017-09-30: 1000 mL via INTRAVENOUS

## 2017-09-30 MED ORDER — HEPARIN (PORCINE) IN NACL 100-0.45 UNIT/ML-% IJ SOLN
1200.0000 [IU]/h | INTRAMUSCULAR | Status: DC
  Administered 2017-09-30: 1200 [IU]/h via INTRAVENOUS
  Filled 2017-09-30: qty 250

## 2017-09-30 SURGICAL SUPPLY — 86 items
ADH SKN CLS APL DERMABOND .7 (GAUZE/BANDAGES/DRESSINGS) ×1
ADH SKN CLS LQ APL DERMABOND (GAUZE/BANDAGES/DRESSINGS) ×2
AGENT HMST SPONGE THK3/8 (HEMOSTASIS) ×1
BAG SNAP BAND KOVER 36X36 (MISCELLANEOUS) ×1 IMPLANT
BANDAGE ESMARK 6X9 LF (GAUZE/BANDAGES/DRESSINGS) IMPLANT
BNDG CMPR 9X6 STRL LF SNTH (GAUZE/BANDAGES/DRESSINGS)
BNDG ESMARK 6X9 LF (GAUZE/BANDAGES/DRESSINGS)
CANISTER SUCT 3000ML PPV (MISCELLANEOUS) ×2 IMPLANT
CATH EMB 3FR 80CM (CATHETERS) ×1 IMPLANT
CATH EMB 4FR 80CM (CATHETERS) ×2 IMPLANT
CATH EMB 5FR 80CM (CATHETERS) ×1 IMPLANT
CLIP VESOCCLUDE MED 24/CT (CLIP) ×2 IMPLANT
CLIP VESOCCLUDE SM WIDE 24/CT (CLIP) ×2 IMPLANT
COVER BACK TABLE 60X90IN (DRAPES) ×1 IMPLANT
COVER DOME SNAP 22 D (MISCELLANEOUS) ×1 IMPLANT
COVER PROBE W GEL 5X96 (DRAPES) ×2 IMPLANT
CUFF TOURNIQUET SINGLE 24IN (TOURNIQUET CUFF) IMPLANT
CUFF TOURNIQUET SINGLE 34IN LL (TOURNIQUET CUFF) IMPLANT
CUFF TOURNIQUET SINGLE 44IN (TOURNIQUET CUFF) IMPLANT
DERMABOND ADHESIVE PROPEN (GAUZE/BANDAGES/DRESSINGS) ×2
DERMABOND ADVANCED (GAUZE/BANDAGES/DRESSINGS) ×1
DERMABOND ADVANCED .7 DNX12 (GAUZE/BANDAGES/DRESSINGS) ×1 IMPLANT
DERMABOND ADVANCED .7 DNX6 (GAUZE/BANDAGES/DRESSINGS) IMPLANT
DRAIN CHANNEL 15F RND FF W/TCR (WOUND CARE) IMPLANT
DRAPE C-ARM 42X72 X-RAY (DRAPES) ×2 IMPLANT
DRAPE HALF SHEET 40X57 (DRAPES) IMPLANT
DRSG TEGADERM 4X4.75 (GAUZE/BANDAGES/DRESSINGS) ×1 IMPLANT
ELECT REM PT RETURN 9FT ADLT (ELECTROSURGICAL) ×2
ELECTRODE REM PT RTRN 9FT ADLT (ELECTROSURGICAL) ×1 IMPLANT
EVACUATOR SILICONE 100CC (DRAIN) IMPLANT
GAUZE SPONGE 4X4 16PLY XRAY LF (GAUZE/BANDAGES/DRESSINGS) ×1 IMPLANT
GLOVE BIO SURGEON STRL SZ7 (GLOVE) ×6 IMPLANT
GLOVE BIOGEL PI IND STRL 6.5 (GLOVE) IMPLANT
GLOVE BIOGEL PI IND STRL 7.0 (GLOVE) IMPLANT
GLOVE BIOGEL PI IND STRL 7.5 (GLOVE) ×1 IMPLANT
GLOVE BIOGEL PI INDICATOR 6.5 (GLOVE) ×3
GLOVE BIOGEL PI INDICATOR 7.0 (GLOVE) ×1
GLOVE BIOGEL PI INDICATOR 7.5 (GLOVE) ×5
GLOVE SURG SS PI 6.5 STRL IVOR (GLOVE) ×1 IMPLANT
GLOVE SURG SS PI 7.0 STRL IVOR (GLOVE) ×2 IMPLANT
GOWN STRL REUS W/ TWL LRG LVL3 (GOWN DISPOSABLE) ×3 IMPLANT
GOWN STRL REUS W/ TWL XL LVL3 (GOWN DISPOSABLE) IMPLANT
GOWN STRL REUS W/TWL LRG LVL3 (GOWN DISPOSABLE) ×6
GOWN STRL REUS W/TWL XL LVL3 (GOWN DISPOSABLE) ×2
GUIDEWIRE AMPLATZ STIFF 0.35 (WIRE) ×1 IMPLANT
HEMOSTAT SPONGE AVITENE ULTRA (HEMOSTASIS) ×1 IMPLANT
INSERT FOGARTY SM (MISCELLANEOUS) IMPLANT
KIT BASIN OR (CUSTOM PROCEDURE TRAY) ×2 IMPLANT
KIT ROOM TURNOVER OR (KITS) ×2 IMPLANT
MARKER GRAFT CORONARY BYPASS (MISCELLANEOUS) IMPLANT
NDL PERC 18GX7CM (NEEDLE) IMPLANT
NEEDLE PERC 18GX7CM (NEEDLE) ×2 IMPLANT
NS IRRIG 1000ML POUR BTL (IV SOLUTION) ×4 IMPLANT
PACK PERIPHERAL VASCULAR (CUSTOM PROCEDURE TRAY) ×2 IMPLANT
PAD ARMBOARD 7.5X6 YLW CONV (MISCELLANEOUS) ×4 IMPLANT
PATCH VASC XENOSURE 1CMX6CM (Vascular Products) ×2 IMPLANT
PATCH VASC XENOSURE 1X6 (Vascular Products) IMPLANT
SET MICROPUNCTURE 5F STIFF (MISCELLANEOUS) ×4 IMPLANT
SHEATH AVANTI 11CM 5FR (MISCELLANEOUS) ×1 IMPLANT
STOPCOCK 4 WAY LG BORE MALE ST (IV SETS) IMPLANT
STOPCOCK MORSE 400PSI 3WAY (MISCELLANEOUS) ×2 IMPLANT
SUT ETHILON 3 0 PS 1 (SUTURE) IMPLANT
SUT GORETEX 5 0 TT13 24 (SUTURE) IMPLANT
SUT GORETEX 6.0 TT13 (SUTURE) IMPLANT
SUT MNCRL AB 4-0 PS2 18 (SUTURE) ×6 IMPLANT
SUT PROLENE 5 0 C 1 24 (SUTURE) ×2 IMPLANT
SUT PROLENE 6 0 BV (SUTURE) ×11 IMPLANT
SUT PROLENE 7 0 BV 1 (SUTURE) IMPLANT
SUT PROLENE 7 0 BV1 MDA (SUTURE) ×1 IMPLANT
SUT SILK 2 0 PERMA HAND 18 BK (SUTURE) IMPLANT
SUT SILK 3 0 (SUTURE)
SUT SILK 3 0 SH CR/8 (SUTURE) ×1 IMPLANT
SUT SILK 3-0 18XBRD TIE 12 (SUTURE) IMPLANT
SUT VIC AB 2-0 CT1 27 (SUTURE) ×6
SUT VIC AB 2-0 CT1 TAPERPNT 27 (SUTURE) ×2 IMPLANT
SUT VIC AB 3-0 SH 27 (SUTURE) ×10
SUT VIC AB 3-0 SH 27X BRD (SUTURE) ×3 IMPLANT
SYR 3ML LL SCALE MARK (SYRINGE) ×2 IMPLANT
SYR MEDRAD MARK V 150ML (SYRINGE) ×1 IMPLANT
SYR TB 1ML LUER SLIP (SYRINGE) ×1 IMPLANT
SYRINGE 20CC LL (MISCELLANEOUS) ×3 IMPLANT
TRAY FOLEY W/METER SILVER 16FR (SET/KITS/TRAYS/PACK) ×2 IMPLANT
TUBING EXTENTION W/L.L. (IV SETS) IMPLANT
UNDERPAD 30X30 (UNDERPADS AND DIAPERS) ×2 IMPLANT
WATER STERILE IRR 1000ML POUR (IV SOLUTION) ×2 IMPLANT
WIRE BENTSON .035X145CM (WIRE) ×1 IMPLANT

## 2017-09-30 NOTE — Interval H&P Note (Deleted)
History and Physical Interval Note:  09/30/2017 8:02 PM  Jordan Arellano  has presented today for surgery, with the diagnosis of Ischemic Left leg  The various methods of treatment have been discussed with the patient and family. After consideration of risks, benefits and other options for treatment, the patient has consented to  Procedure(s): LEFT LEG ANGIOGRAM, POSSIBLE THROMBECTOMY, FEM-POPLITEAL BYPASS GRAFT (Left) as a surgical intervention .  The patient's history has been reviewed, patient examined, no change in status, stable for surgery.  I have reviewed the patient's chart and labs.  Questions were answered to the patient's satisfaction.     Adele Barthel

## 2017-09-30 NOTE — Anesthesia Procedure Notes (Signed)
Procedure Name: Intubation Date/Time: 09/30/2017 8:05 PM Performed by: Claris Che Pre-anesthesia Checklist: Patient identified, Emergency Drugs available, Suction available, Patient being monitored and Timeout performed Patient Re-evaluated:Patient Re-evaluated prior to induction Oxygen Delivery Method: Circle system utilized Preoxygenation: Pre-oxygenation with 100% oxygen Induction Type: IV induction and Cricoid Pressure applied Ventilation: Mask ventilation without difficulty Laryngoscope Size: Mac and 4 Grade View: Grade I Tube type: Oral Tube size: 8.0 mm Airway Equipment and Method: Stylet Placement Confirmation: ETT inserted through vocal cords under direct vision and breath sounds checked- equal and bilateral Secured at: 23 cm Tube secured with: Tape Dental Injury: Teeth and Oropharynx as per pre-operative assessment

## 2017-09-30 NOTE — Interval H&P Note (Signed)
History and Physical Interval Note:  09/30/2017 8:04 PM  Jordan Arellano  has presented today for surgery, with the diagnosis of Ischemic Left leg  The various methods of treatment have been discussed with the patient and family. After consideration of risks, benefits and other options for treatment, the patient has consented to  Procedure(s): LEFT LEG ANGIOGRAM, POSSIBLE THROMBECTOMY, FEM-POPLITEAL BYPASS GRAFT (Left) as a surgical intervention .  The patient's history has been reviewed, patient examined, no change in status, stable for surgery.  I have reviewed the patient's chart and labs.  Questions were answered to the patient's satisfaction.     Adele Barthel

## 2017-09-30 NOTE — ED Provider Notes (Signed)
[facility] EMERGENCY DEPARTMENT Provider Note   CSN: [id number] Arrival date & time: 09/30/17  1715     History   Chief Complaint Chief Complaint  Patient presents with  . Leg Pain  . Hypotension    HPI Jordan Arellano is a 66 y.o. male.  Who presents the emergency department with chief complaint of acute left lower extremity pain.  Is a past medical history of peripheral arterial disease, chronic tobacco dependence, previous stroke, hyperlipidemia, hypertension and depression.  Patient states that he always has some numbness in the foot however today he had sudden onset of severe pain and numbness in his left lower extremity.  He rates the pain at 10 out of 10.  The patient was found by EMS to be hypotensive given 800 cc of fluid prior to arrival with pressures at 130/78 upon arrival.  I am unsure of his initial pressure.  He denies fevers, chills, injury to the leg.  He has a history of right BKA for peripheral vascular disease.  Patient is newly diagnosed with diabetes as well.  HPI  Past Medical History:  Diagnosis Date  . Depression   . Hyperlipidemia   . Hypertension   . PAD (peripheral artery disease) ([place]) ~2007   s/p R BKA for non-healing wound  . Stroke ([place]) 03/2014   MRI: Acute nonhemorrhagic left paracentral pontine infarct. 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  Marland Kitchen TIA (transient ischemic attack) 01/2014    Patient Active Problem List   Diagnosis Date Noted  . Anxiety and depression 02/15/2017  . Diabetes mellitus due to underlying condition, uncontrolled ([place]) 12/19/2016  . Wound infection 12/12/2016  . Foot ulcer, left ([place]) 12/12/2016  . Foot ulcer ([place]) 12/12/2016  . Hypertriglyceridemia 10/01/2016  . Hyperlipidemia 09/30/2016  . S/P unilateral BKA (below knee amputation), right ([place]) 09/30/2016  . Neuropathy 09/30/2016  . Vascular disease, peripheral ([place])  09/30/2016  . Phantom pain after amputation of lower extremity ([place]) 09/30/2016  . CVA (cerebral vascular accident) ([place]) 03/25/2014  . Essential hypertension 03/25/2014  . Tobacco abuse 03/25/2014    Past Surgical History:  Procedure Laterality Date  . BELOW KNEE LEG AMPUTATION Right   . FEMORAL-POPLITEAL BYPASS GRAFT Left 12/17/2016   Procedure: BYPASS LEFT FEMORAL TO BELOW POPLITEAL ARTERY USING PROPATEN GORE GRAFT;  Surgeon: Rosetta Posner, MD;  Location: [facility];  Service: Vascular;  Laterality: Left;  . PERIPHERAL VASCULAR CATHETERIZATION Left 12/14/2016   Procedure: Abdominal Aortogram w/Lower Extremity;  Surgeon: Angelia Mould, MD;  Location: [facility];  Service: Cardiovascular;  Laterality: Left;  . TRANSTHORACIC ECHOCARDIOGRAM  01/2014   To evaluate possible CVA: EF 55-60%. GR 1 DD. No significant valvular lesions       Home Medications    Prior to Admission medications   Medication Sig Start Date End Date Taking? Authorizing Provider  amLODipine (NORVASC) 5 MG tablet Take 1 tablet (5 mg total) by mouth daily. 05/12/17   Nche, Charlene Brooke, NP  aspirin 81 MG tablet Take 81 mg by mouth daily.    [provider]  atorvastatin (LIPITOR) 40 MG tablet Take 1 tablet (40 mg total) by mouth daily at 6 PM. Pt need to see PCP for more refills. 08/09/17   Nche, Charlene Brooke, NP  blood glucose meter kit and supplies KIT Dispense based on patient and insurance preference. Use two times daily as directed (before breakfast and at bedtime. (  FOR ICD-10: E08.42) 05/12/17   Nche, Charlene Brooke, NP  diphenhydramine-acetaminophen (TYLENOL PM) 25-500 MG TABS tablet Take 2 tablets by mouth at bedtime as needed.    [provider]  fenofibrate (TRICOR) 145 MG tablet Take 1 tablet (145 mg total) by mouth daily. 04/21/17   Nche, Charlene Brooke, NP  hydrochlorothiazide (HYDRODIURIL) 12.5 MG tablet Take 1 tablet (12.5 mg total) by mouth daily. Need to see PCP for future refills.  09/06/17   Nche, Charlene Brooke, NP  insulin glargine (LANTUS) 100 unit/mL SOPN Inject 0.15 mLs (15 Units total) into the skin at bedtime. 05/13/17   Nche, Charlene Brooke, NP  Insulin Pen Needle (PEN NEEDLES) 31G X 6 MM MISC 1 application by Does not apply route at bedtime. 05/13/17   Nche, Charlene Brooke, NP  metFORMIN (GLUCOPHAGE) 500 MG tablet Take 1 tablet (500 mg total) by mouth 2 (two) times daily with a meal. 05/12/17   Nche, Charlene Brooke, NP  olmesartan (BENICAR) 40 MG tablet Take 1 tablet (40 mg total) by mouth daily. 05/12/17   Nche, Charlene Brooke, NP  omega-3 acid ethyl esters (LOVAZA) 1 g capsule Take 2 capsules (2 g total) by mouth 2 (two) times daily. 10/11/16   Leonie Man, MD  potassium chloride SA (K-DUR,KLOR-CON) 10 MEQ tablet Take 1 tablet (10 mEq total) by mouth daily. 05/13/17   Nche, Charlene Brooke, NP  pregabalin (LYRICA) 75 MG capsule Take 1 capsule (75 mg total) by mouth 2 (two) times daily. 05/12/17   Nche, Charlene Brooke, NP  sertraline (ZOLOFT) 100 MG tablet Take 1 tablet (100 mg total) by mouth daily. 05/12/17   Nche, Charlene Brooke, NP    Family History Family History  Problem Relation Age of Onset  . Hypertension Mother        Does not know history  . Heart disease Mother   . Stroke Mother   . Diabetes Mother   . Hypertension Father   . Heart disease Father   . Stroke Father   . Diabetes Sister   . Hypertension Sister   . Heart disease Brother   . Hypertension Brother     Social History Social History  Substance Use Topics  . Smoking status: Current Some Day Smoker    Packs/day: 0.50    Years: 35.00    Types: Cigarettes  . Smokeless tobacco: Never Used  . Alcohol use No     Allergies   No known allergies   Review of Systems Review of Systems  Ten systems reviewed and are negative for acute change, except as noted in the HPI.   Physical Exam Updated Vital Signs BP 110/73   Pulse (!) 57   Temp (!) 97.5 F (36.4 C) (Oral)   Resp 15   Ht _0  (1.854  m)   Wt 72.1 kg (159 lb)   SpO2 100%   BMI 20.98 kg/m   Physical Exam  Constitutional: He appears well-developed and well-nourished. No distress.  Chronically ill-appearing male  HENT:  Head: Normocephalic and atraumatic.  Eyes: Conjunctivae are normal. No scleral icterus.  Neck: Normal range of motion. Neck supple.  Cardiovascular: Normal rate, regular rhythm and normal heart sounds.   Left leg pale, milky white, cold to the touch.  No palpable pulses.  I am unable to Doppler a DP, PT or popliteal pulse.  Pulmonary/Chest: Effort normal and breath sounds normal. No respiratory distress.  Abdominal: Soft. There is no tenderness.  Musculoskeletal: He exhibits no edema.  Neurological:  He is alert.  Skin: Skin is warm and dry. He is not diaphoretic.  Psychiatric: His behavior is normal.  Nursing note and vitals reviewed.    ED Treatments / Results  Labs (all labs ordered are listed, but only abnormal results are displayed) Labs Reviewed - No data to display  EKG  EKG Interpretation None       Radiology No results found.  Procedures .Critical Care Performed by: Margarita Mail Authorized by: Margarita Mail   Critical care provider statement:    Critical care time (minutes):  40   Critical care time was exclusive of:  Separately billable procedures and treating other patients   Critical care was necessary to treat or prevent imminent or life-threatening deterioration of the following conditions:  Circulatory failure (acute ischemic limb)   Critical care was time spent personally by me on the following activities:  Development of treatment plan with patient or surrogate, discussions with consultants, evaluation of patient's response to treatment, examination of patient, interpretation of cardiac output measurements, ordering and performing treatments and interventions, ordering and review of laboratory studies, ordering and review of radiographic studies, pulse oximetry,  re-evaluation of patient's condition, review of old charts and obtaining history from patient or surrogate   (including critical care time)  Medications Ordered in ED Medications - No data to display   Initial Impression / Assessment and Plan / ED Course  I have reviewed the triage vital signs and the nursing notes.  Pertinent labs & imaging results that were available during my care of the patient were reviewed by me and considered in my medical decision making (see chart for details).  Clinical Course as of Oct 02 31  Fri Sep 30, 2017  1853 Patient seen and shared visit with Dr. Steffanie Dunn.  I spoke with Dr. Bridgett Larsson on vascular surgery who asked for a CT Angio.  I have given the patient fentanyl for pain control.  His vital signs are stable at this point.  I assume acute on chronic peripheral vascular disease with acute arterial occlusion.  [AH]  1856 Dr. Bridgett Larsson returned a call and asks that we discontinue imaging. He has a previous hx of fempop- bypass. Patient will be taken to OR. Preop labs pending.  [AH]    Clinical Course User Index [AH] Margarita Mail, PA-C    Patient taken to or for emergent surgery  Final Clinical Impressions(s) / ED Diagnoses   Final diagnoses:  Pre-op chest exam    New Prescriptions New Prescriptions   No medications on file     Margarita Mail, PA-C 10/01/17 0041    Elnora Morrison, MD 10/01/17 [phone]

## 2017-09-30 NOTE — Progress Notes (Signed)
ANTICOAGULATION CONSULT NOTE - Initial Consult  Pharmacy Consult for heparin dosing Indication: peripheral vascular acute occlusion  Allergies  Allergen Reactions  . No Known Allergies     Patient Measurements: Height: 6\' 1"  (185.4 cm) Weight: 159 lb (72.1 kg) IBW/kg (Calculated) : 79.9 Heparin Dosing Weight: 72.1 kg  Vital Signs: Temp: 97.5 F (36.4 C) (10/26 1726) Temp Source: Oral (10/26 1726) BP: 110/73 (10/26 1800) Pulse Rate: 57 (10/26 1800)  Labs: No results for input(s): HGB, HCT, PLT, APTT, LABPROT, INR, HEPARINUNFRC, HEPRLOWMOCWT, CREATININE, CKTOTAL, CKMB, TROPONINI in the last 72 hours.  CrCl cannot be calculated (Patient's most recent lab result is older than the maximum 21 days allowed.).   Medical History: Past Medical History:  Diagnosis Date  . Depression   . Hyperlipidemia   . Hypertension   . PAD (peripheral artery disease) ([place]) ~2007   s/p R BKA for non-healing wound  . Stroke ([place]) 03/2014   MRI: Acute nonhemorrhagic left paracentral pontine infarct. 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  Marland Kitchen TIA (transient ischemic attack) 01/2014    Medications:  Scheduled:  . fentaNYL (SUBLIMAZE) injection  50 mcg Intravenous Once  . ondansetron (ZOFRAN) IV  4 mg Intravenous STAT    Assessment: 46 YOM admitted with leg pain and hypotension. No anticoag PTA, no bleeding reported.  Hgb 13.9, Hct 41.0, Plt 219.   Goal of Therapy:  Heparin level 0.3-0.7 units/ml Monitor platelets by anticoagulation protocol: Yes   Plan:  Give 4000 units bolus x 1 Start heparin infusion at 1200 units/hr  6 hour heparin level, daily heparin and CBC Monitor s/sx of bleeding.   Jerrye Noble, PharmD Candidate  09/30/2017,6:32 PM

## 2017-09-30 NOTE — Consult Note (Deleted)
Requested by:  Dr. Elnora Morrison [facility] ED)  Reason for consultation: Left leg ischemia    History of Present Illness   Jordan Arellano is a 66 y.o. (1951/08/11) male who presents with cc: left leg pain and numbness.  Pt is s/p L CFA to BK pop BPG w/ Propaten with Dr. Donnetta Hutching on 12/17/16.  Pt notes acute onset of left leg pain at 4 PM today.  He denies any abnormal heart beats.  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  Pt last ate this morning.      Past Medical History:  Diagnosis Date  . Depression   . Hyperlipidemia   . Hypertension   . PAD (peripheral artery disease) ([place]) ~2007   s/p R BKA for non-healing wound  . Stroke ([place]) 03/2014   MRI: Acute nonhemorrhagic left paracentral pontine infarct. 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  Marland Kitchen TIA (transient ischemic attack) 01/2014         Past Surgical History:  Procedure Laterality Date  . BELOW KNEE LEG AMPUTATION Right   . FEMORAL-POPLITEAL BYPASS GRAFT Left 12/17/2016   Procedure: BYPASS LEFT FEMORAL TO BELOW POPLITEAL ARTERY USING PROPATEN GORE GRAFT;  Surgeon: Rosetta Posner, MD;  Location: [facility];  Service: Vascular;  Laterality: Left;  . PERIPHERAL VASCULAR CATHETERIZATION Left 12/14/2016   Procedure: Abdominal Aortogram w/Lower Extremity;  Surgeon: Angelia Mould, MD;  Location: [facility];  Service: Cardiovascular;  Laterality: Left;  . TRANSTHORACIC ECHOCARDIOGRAM  01/2014   To evaluate possible CVA: EF 55-60%. GR 1 DD. No significant valvular lesions     Social History        Social History  . Marital status: Single    Spouse name: N/A  . Number of children: N/A  . Years of education: N/A      Occupational History  . Not on file.        Social History Main Topics  . Smoking status: Current Some Day Smoker    Packs/day: 0.50    Years: 35.00      Types: Cigarettes  . Smokeless tobacco: Never Used  . Alcohol use No  . Drug use: No  . Sexual activity: Not on file       Other Topics Concern  . Not on file      Social History Narrative  . No narrative on file         Family History  Problem Relation Age of Onset  . Hypertension Mother        Does not know history  . Heart disease Mother   . Stroke Mother   . Diabetes Mother   . Hypertension Father   . Heart disease Father   . Stroke Father   . Diabetes Sister   . Hypertension Sister   . Heart disease Brother   . Hypertension Brother     No current facility-administered medications on file prior to encounter.          Current Outpatient Prescriptions on File Prior to Encounter  Medication Sig Dispense Refill  . amLODipine (NORVASC) 5 MG tablet Take 1 tablet (5 mg total) by mouth daily. 90 tablet 1  . aspirin 81 MG tablet Take 81 mg by mouth daily.    Marland Kitchen atorvastatin (LIPITOR) 40 MG tablet Take 1 tablet (40 mg total) by mouth daily at  6 PM. Pt need to see PCP for more refills. 90 tablet 0  . blood glucose meter kit and supplies KIT Dispense based on patient and insurance preference. Use two times daily as directed (before breakfast and at bedtime. (FOR ICD-10: E08.42) 1 each 2  . diphenhydramine-acetaminophen (TYLENOL PM) 25-500 MG TABS tablet Take 2 tablets by mouth at bedtime as needed.    . fenofibrate (TRICOR) 145 MG tablet Take 1 tablet (145 mg total) by mouth daily. 90 tablet 0  . hydrochlorothiazide (HYDRODIURIL) 12.5 MG tablet Take 1 tablet (12.5 mg total) by mouth daily. Need to see PCP for future refills. 90 tablet 0  . insulin glargine (LANTUS) 100 unit/mL SOPN Inject 0.15 mLs (15 Units total) into the skin at bedtime. 3 pen 1  . Insulin Pen Needle (PEN NEEDLES) 31G X 6 MM MISC 1 application by Does not apply route at bedtime. 100 each 2  . metFORMIN (GLUCOPHAGE) 500 MG tablet Take 1 tablet (500 mg total) by mouth 2 (two) times  daily with a meal. 180 tablet 0  . olmesartan (BENICAR) 40 MG tablet Take 1 tablet (40 mg total) by mouth daily. 90 tablet 1  . omega-3 acid ethyl esters (LOVAZA) 1 g capsule Take 2 capsules (2 g total) by mouth 2 (two) times daily. 360 capsule 3  . potassium chloride SA (K-DUR,KLOR-CON) 10 MEQ tablet Take 1 tablet (10 mEq total) by mouth daily. 30 tablet 3  . pregabalin (LYRICA) 75 MG capsule Take 1 capsule (75 mg total) by mouth 2 (two) times daily. 60 capsule 0  . sertraline (ZOLOFT) 100 MG tablet Take 1 tablet (100 mg total) by mouth daily. 90 tablet 1              Current Facility-Administered Medications  Medication Dose Route Frequency Provider Last Rate Last Dose  . heparin ADULT infusion 100 units/mL (25000 units/266m sodium chloride 0.45%)  1,200 Units/hr Intravenous Continuous ZElnora Morrison MD 12 mL/hr at 09/30/17 1903 1,200 Units/hr at 09/30/17 1903            Facility-Administered Medications Ordered in Other Encounters  Medication Dose Route Frequency Provider Last Rate Last Dose  . lactated ringers infusion    Continuous PRN BClaris Che CRNA            Allergies  Allergen Reactions  . No Known Allergies     REVIEW OF SYSTEMS (negative unless checked):   Cardiac:  _0  Chest pain or chest pressure? _1  Shortness of breath upon activity? _2  Shortness of breath when lying flat? _3  Irregular heart rhythm?  Vascular:  _4  Pain in calf, thigh, or hip brought on by walking? _5  Pain in feet at night that wakes you up from your sleep? _6  Blood clot in your veins? _7  Leg swelling?  Pulmonary:  _8  Oxygen at home? _9  Productive cough? _10  Wheezing?  Neurologic:  _11  Sudden weakness in arms or legs? _12  Sudden numbness in arms or legs? _13  Sudden onset of difficult speaking or slurred speech? _14  Temporary loss of vision in one eye? _15  Problems with dizziness?  Gastrointestinal:  _16  Blood in stool? _17  Vomited blood?  Genitourinary:  _18   Burning when urinating? _19  Blood in urine?  Psychiatric:  _20  Major depression  Hematologic:  _21  Bleeding problems? _22  Problems with blood clotting?  Dermatologic:  _23  Rashes or ulcers?  Constitutional:  _24  Fever or chills?  Ear/Nose/Throat:  _25  Change in hearing? _26  Nose bleeds? _27  Sore throat?  Musculoskeletal:  _28  Back  pain? _0  Joint pain? _1  Muscle pain?   For VQI Use Only   PRE-ADM LIVING Home  AMB STATUS Ambulatory  CAD Sx None  PRIOR CHF None  STRESS TEST No    Physical Examination           Vitals:   09/30/17 1745 09/30/17 1800 09/30/17 1830 09/30/17 1845  BP: 117/65 110/73 122/75 132/76  Pulse: 72 (!) 57 69 (!) 54  Resp: _2 Temp:      TempSrc:      SpO2: 100% 100% 99% 100%  Weight:      Height:       Body mass index is 20.98 kg/m.  General Alert, O x 3, WD, NAD  Head [place]/AT,    Ear/Nose/ Throat Hearing grossly intact, nares without erythema or drainage, oropharynx without Erythema or Exudate, Mallampati score: 3,   Eyes PERRLA, EOMI,    Neck Supple, mid-line trachea,    Pulmonary Sym exp, good B air movt, CTA B  Cardiac RRR, Nl S1, S2, no Murmurs, No rubs, No S3,S4  Vascular Vessel Right Left  Radial Palpable Palpable  Brachial Palpable Palpable  Carotid Palpable, No Bruit Palpable, No Bruit  Aorta Not palpable N/A  Femoral Not palpable Faintly palpable  Popliteal Not palpable Not palpable  PT BKA Not palpable  DP BKA Not palpable    Gastro- intestinal soft, non-distended, non-tender to palpation, No guarding or rebound, no HSM, no masses, no CVAT B, No palpable prominent aortic pulse, umbilical hernia  Musculo- skeletal M/S 5/5 throughout except R BKA and L lower leg 3-4/5  , R BKA, L lower leg mottled in appearance  , No edema present, No visible varicosities , No Lipodermatosclerosis present  Neurologic Cranial nerves 2-12 intact , Pain and light touch intact in extremitiesexcept decreased  sensation in L foot, R BKA, Motor exam as listed above  Psychiatric Judgement intact, Mood & affect appropriate for pt's clinical situation  Dermatologic See M/S exam for extremity exam, No rashes otherwise noted  Lymphatic  Palpable lymph nodes: None    Laboratory   CBC CBC Latest Ref Rng & Units 09/30/2017 09/30/2017 12/19/2016  WBC 4.0 - 10.5 K/uL - 15.3(H) 11.1(H)  Hemoglobin 13.0 - 17.0 g/dL 13.9 14.2 11.4(L)  Hematocrit 39.0 - 52.0 % 41.0 42.6 34.5(L)  Platelets 150 - 400 K/uL - 219 261    BMP BMP Latest Ref Rng & Units 09/30/2017 09/30/2017 05/12/2017  Glucose 65 - 99 mg/dL 160(H) 157(H) 281(H)  BUN 6 - 20 mg/dL 22(H) 19 31(H)  Creatinine 0.61 - 1.24 mg/dL 1.20 1.31(H) 1.44  Sodium 135 - 145 mmol/L 140 138 136  Potassium 3.5 - 5.1 mmol/L 4.4 4.4 4.1  Chloride 101 - 111 mmol/L 105 105 102  CO2 22 - 32 mmol/L - 22 27  Calcium 8.9 - 10.3 mg/dL - 8.8(L) 9.8    Coagulation Recent Labs       Lab Results  Component Value Date   INR 1.00 09/30/2017   INR 1.08 12/14/2016   INR 1.01 03/25/2014     Recent Labs  No results found for: PTT    Lipids Labs (Brief)           Component Value Date/Time   CHOL 229 (H) 05/12/2017 1205   TRIG (H) 05/12/2017 1205    662.0 Triglyceride is over 400; calculations on Lipids are invalid.   HDL 27.90 (L) 05/12/2017 1205   CHOLHDL 8 05/12/2017 1205   VLDL UNABLE  TO CALCULATE IF TRIGLYCERIDE OVER 400 mg/dL 03/27/2014 0536   LDLCALC UNABLE TO CALCULATE IF TRIGLYCERIDE OVER 400 mg/dL 03/27/2014 0536   LDLDIRECT 93.0 05/12/2017 1205      Medical Decision Making   Jordan Arellano is a 66 y.o. 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  A femoropopliteal bypass with graft should last 2-3 years, but this patient's bypass has even  lasted much more than 9 month.  I have concerns that the cause of this patient's thrombosed femoropopliteal bypass is progression of tibial disease.  If this is such, thrombectomy of the femoropopliteal bypass is unlikely to have any lasting patency.  In this patient with no left foot wounds, I would NOT do a femorotibial bypass with prosthetic as it would not have any lasting patency anyway.  I discussed with patient emergently proceeding with: Left leg angiogram, possible left femoropopliteal bypass, possible redo bypass.  The risk, benefits, and alternative for bypass operations were discussed with the patient.    The patient is aware the risks include but are not limited to: bleeding, infection, myocardial infarction, stroke, limb loss, nerve damage, limb edema, need for additional procedures in the future, wound complications, and inability to complete the bypass.   I discussed with the patient the nature of angiographic procedures, especially the limited patencies of any endovascular intervention.    The patient is aware of that the risks of an angiographic procedure include but are not limited to: bleeding, infection, access site complications, renal failure, embolization, rupture of vessel, dissection, arteriovenous fistula, possible need for emergent surgical intervention, possible need for surgical procedures to treat the patient's pathology, anaphylactic reaction to contrast, and stroke and death.    The patient is aware of these risks and agreed to proceed.  Thank you for allowing [place] to participate in this patient's care.   Adele Barthel, MD, FACS [facility] Office: [phone] Pager: [phone]  09/30/2017, 7:48 PM

## 2017-09-30 NOTE — ED Triage Notes (Addendum)
Pt BIB EMS for left leg pain. Hypotensive with EMS, received 800 cc NS PTA, BP now 130/78. Pt reports left leg pain onset 2 hours ago while resting in bed; cap refill delayed, dorsalis pedis pulse intermittent and thready. Pt states foot is always numb. Rt BKA. Resp e/u; A&Ox4. NAD.

## 2017-09-30 NOTE — H&P (View-Only) (Signed)
Requested by:  Dr. Elnora Morrison [facility] ED)  Reason for consultation: Left leg ischemia    History of Present Illness   Jordan Arellano is a 66 y.o. (1951/08/11) male who presents with cc: left leg pain and numbness.  Pt is s/p L CFA to BK pop BPG w/ Propaten with Dr. Donnetta Hutching on 12/17/16.  Pt notes acute onset of left leg pain at 4 PM today.  He denies any abnormal heart beats.  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  Pt last ate this morning.      Past Medical History:  Diagnosis Date  . Depression   . Hyperlipidemia   . Hypertension   . PAD (peripheral artery disease) ([place]) ~2007   s/p R BKA for non-healing wound  . Stroke ([place]) 03/2014   MRI: Acute nonhemorrhagic left paracentral pontine infarct. 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  Marland Kitchen TIA (transient ischemic attack) 01/2014         Past Surgical History:  Procedure Laterality Date  . BELOW KNEE LEG AMPUTATION Right   . FEMORAL-POPLITEAL BYPASS GRAFT Left 12/17/2016   Procedure: BYPASS LEFT FEMORAL TO BELOW POPLITEAL ARTERY USING PROPATEN GORE GRAFT;  Surgeon: Rosetta Posner, MD;  Location: [place];  Service: Vascular;  Laterality: Left;  . PERIPHERAL VASCULAR CATHETERIZATION Left 12/14/2016   Procedure: Abdominal Aortogram w/Lower Extremity;  Surgeon: Angelia Mould, MD;  Location: [place] CV LAB;  Service: Cardiovascular;  Laterality: Left;  . TRANSTHORACIC ECHOCARDIOGRAM  01/2014   To evaluate possible CVA: EF 55-60%. GR 1 DD. No significant valvular lesions     Social History        Social History  . Marital status: Single    Spouse name: N/A  . Number of children: N/A  . Years of education: N/A      Occupational History  . Not on file.        Social History Main Topics  . Smoking status: Current Some Day Smoker    Packs/day: 0.50    Years: 35.00      Types: Cigarettes  . Smokeless tobacco: Never Used  . Alcohol use No  . Drug use: No  . Sexual activity: Not on file       Other Topics Concern  . Not on file      Social History Narrative  . No narrative on file         Family History  Problem Relation Age of Onset  . Hypertension Mother        Does not know history  . Heart disease Mother   . Stroke Mother   . Diabetes Mother   . Hypertension Father   . Heart disease Father   . Stroke Father   . Diabetes Sister   . Hypertension Sister   . Heart disease Brother   . Hypertension Brother     No current facility-administered medications on file prior to encounter.          Current Outpatient Prescriptions on File Prior to Encounter  Medication Sig Dispense Refill  . amLODipine (NORVASC) 5 MG tablet Take 1 tablet (5 mg total) by mouth daily. 90 tablet 1  . aspirin 81 MG tablet Take 81 mg by mouth daily.    Marland Kitchen atorvastatin (LIPITOR) 40 MG tablet Take 1 tablet (40 mg total) by mouth daily at  6 PM. Pt need to see PCP for more refills. 90 tablet 0  . blood glucose meter kit and supplies KIT Dispense based on patient and insurance preference. Use two times daily as directed (before breakfast and at bedtime. (FOR ICD-10: E08.42) 1 each 2  . diphenhydramine-acetaminophen (TYLENOL PM) 25-500 MG TABS tablet Take 2 tablets by mouth at bedtime as needed.    . fenofibrate (TRICOR) 145 MG tablet Take 1 tablet (145 mg total) by mouth daily. 90 tablet 0  . hydrochlorothiazide (HYDRODIURIL) 12.5 MG tablet Take 1 tablet (12.5 mg total) by mouth daily. Need to see PCP for future refills. 90 tablet 0  . insulin glargine (LANTUS) 100 unit/mL SOPN Inject 0.15 mLs (15 Units total) into the skin at bedtime. 3 pen 1  . Insulin Pen Needle (PEN NEEDLES) 31G X 6 MM MISC 1 application by Does not apply route at bedtime. 100 each 2  . metFORMIN (GLUCOPHAGE) 500 MG tablet Take 1 tablet (500 mg total) by mouth 2 (two) times  daily with a meal. 180 tablet 0  . olmesartan (BENICAR) 40 MG tablet Take 1 tablet (40 mg total) by mouth daily. 90 tablet 1  . omega-3 acid ethyl esters (LOVAZA) 1 g capsule Take 2 capsules (2 g total) by mouth 2 (two) times daily. 360 capsule 3  . potassium chloride SA (K-DUR,KLOR-CON) 10 MEQ tablet Take 1 tablet (10 mEq total) by mouth daily. 30 tablet 3  . pregabalin (LYRICA) 75 MG capsule Take 1 capsule (75 mg total) by mouth 2 (two) times daily. 60 capsule 0  . sertraline (ZOLOFT) 100 MG tablet Take 1 tablet (100 mg total) by mouth daily. 90 tablet 1              Current Facility-Administered Medications  Medication Dose Route Frequency Provider Last Rate Last Dose  . heparin ADULT infusion 100 units/mL (25000 units/266m sodium chloride 0.45%)  1,200 Units/hr Intravenous Continuous ZElnora Morrison MD 12 mL/hr at 09/30/17 1903 1,200 Units/hr at 09/30/17 1903            Facility-Administered Medications Ordered in Other Encounters  Medication Dose Route Frequency Provider Last Rate Last Dose  . lactated ringers infusion    Continuous PRN BClaris Che CRNA            Allergies  Allergen Reactions  . No Known Allergies     REVIEW OF SYSTEMS (negative unless checked):   Cardiac:  _0  Chest pain or chest pressure? _1  Shortness of breath upon activity? _2  Shortness of breath when lying flat? _3  Irregular heart rhythm?  Vascular:  _4  Pain in calf, thigh, or hip brought on by walking? _5  Pain in feet at night that wakes you up from your sleep? _6  Blood clot in your veins? _7  Leg swelling?  Pulmonary:  _8  Oxygen at home? _9  Productive cough? _10  Wheezing?  Neurologic:  _11  Sudden weakness in arms or legs? _12  Sudden numbness in arms or legs? _13  Sudden onset of difficult speaking or slurred speech? _14  Temporary loss of vision in one eye? _15  Problems with dizziness?  Gastrointestinal:  _16  Blood in stool? _17  Vomited blood?  Genitourinary:  _18   Burning when urinating? _19  Blood in urine?  Psychiatric:  _20  Major depression  Hematologic:  _21  Bleeding problems? _22  Problems with blood clotting?  Dermatologic:  _23  Rashes or ulcers?  Constitutional:  _24  Fever or chills?  Ear/Nose/Throat:  _25  Change in hearing? _26  Nose bleeds? _27  Sore throat?  Musculoskeletal:  _28  Back  pain? _0  Joint pain? _1  Muscle pain?   For VQI Use Only   PRE-ADM LIVING Home  AMB STATUS Ambulatory  CAD Sx None  PRIOR CHF None  STRESS TEST No    Physical Examination           Vitals:   09/30/17 1745 09/30/17 1800 09/30/17 1830 09/30/17 1845  BP: 117/65 110/73 122/75 132/76  Pulse: 72 (!) 57 69 (!) 54  Resp: _2 Temp:      TempSrc:      SpO2: 100% 100% 99% 100%  Weight:      Height:       Body mass index is 20.98 kg/m.  General Alert, O x 3, WD, NAD  Head [place]/AT,    Ear/Nose/ Throat Hearing grossly intact, nares without erythema or drainage, oropharynx without Erythema or Exudate, Mallampati score: 3,   Eyes PERRLA, EOMI,    Neck Supple, mid-line trachea,    Pulmonary Sym exp, good B air movt, CTA B  Cardiac RRR, Nl S1, S2, no Murmurs, No rubs, No S3,S4  Vascular Vessel Right Left  Radial Palpable Palpable  Brachial Palpable Palpable  Carotid Palpable, No Bruit Palpable, No Bruit  Aorta Not palpable N/A  Femoral Not palpable Faintly palpable  Popliteal Not palpable Not palpable  PT BKA Not palpable  DP BKA Not palpable    Gastro- intestinal soft, non-distended, non-tender to palpation, No guarding or rebound, no HSM, no masses, no CVAT B, No palpable prominent aortic pulse, umbilical hernia  Musculo- skeletal M/S 5/5 throughout except R BKA and L lower leg 3-4/5  , R BKA, L lower leg mottled in appearance  , No edema present, No visible varicosities , No Lipodermatosclerosis present  Neurologic Cranial nerves 2-12 intact , Pain and light touch intact in extremitiesexcept decreased  sensation in L foot, R BKA, Motor exam as listed above  Psychiatric Judgement intact, Mood & affect appropriate for pt's clinical situation  Dermatologic See M/S exam for extremity exam, No rashes otherwise noted  Lymphatic  Palpable lymph nodes: None    Laboratory   CBC CBC Latest Ref Rng & Units 09/30/2017 09/30/2017 12/19/2016  WBC 4.0 - 10.5 K/uL - 15.3(H) 11.1(H)  Hemoglobin 13.0 - 17.0 g/dL 13.9 14.2 11.4(L)  Hematocrit 39.0 - 52.0 % 41.0 42.6 34.5(L)  Platelets 150 - 400 K/uL - 219 261    BMP BMP Latest Ref Rng & Units 09/30/2017 09/30/2017 05/12/2017  Glucose 65 - 99 mg/dL 160(H) 157(H) 281(H)  BUN 6 - 20 mg/dL 22(H) 19 31(H)  Creatinine 0.61 - 1.24 mg/dL 1.20 1.31(H) 1.44  Sodium 135 - 145 mmol/L 140 138 136  Potassium 3.5 - 5.1 mmol/L 4.4 4.4 4.1  Chloride 101 - 111 mmol/L 105 105 102  CO2 22 - 32 mmol/L - 22 27  Calcium 8.9 - 10.3 mg/dL - 8.8(L) 9.8    Coagulation Recent Labs       Lab Results  Component Value Date   INR 1.00 09/30/2017   INR 1.08 12/14/2016   INR 1.01 03/25/2014     Recent Labs  No results found for: PTT    Lipids Labs (Brief)           Component Value Date/Time   CHOL 229 (H) 05/12/2017 1205   TRIG (H) 05/12/2017 1205    662.0 Triglyceride is over 400; calculations on Lipids are invalid.   HDL 27.90 (L) 05/12/2017 1205   CHOLHDL 8 05/12/2017 1205   VLDL UNABLE  TO CALCULATE IF TRIGLYCERIDE OVER 400 mg/dL 03/27/2014 0536   LDLCALC UNABLE TO CALCULATE IF TRIGLYCERIDE OVER 400 mg/dL 03/27/2014 0536   LDLDIRECT 93.0 05/12/2017 1205      Medical Decision Making   Jordan Arellano is a 66 y.o. 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  A femoropopliteal bypass with graft should last 2-3 years, but this patient's bypass has even  lasted much more than 9 month.  I have concerns that the cause of this patient's thrombosed femoropopliteal bypass is progression of tibial disease.  If this is such, thrombectomy of the femoropopliteal bypass is unlikely to have any lasting patency.  In this patient with no left foot wounds, I would NOT do a femorotibial bypass with prosthetic as it would not have any lasting patency anyway.  I discussed with patient emergently proceeding with: Left leg angiogram, possible left femoropopliteal bypass, possible redo bypass.  The risk, benefits, and alternative for bypass operations were discussed with the patient.    The patient is aware the risks include but are not limited to: bleeding, infection, myocardial infarction, stroke, limb loss, nerve damage, limb edema, need for additional procedures in the future, wound complications, and inability to complete the bypass.   I discussed with the patient the nature of angiographic procedures, especially the limited patencies of any endovascular intervention.    The patient is aware of that the risks of an angiographic procedure include but are not limited to: bleeding, infection, access site complications, renal failure, embolization, rupture of vessel, dissection, arteriovenous fistula, possible need for emergent surgical intervention, possible need for surgical procedures to treat the patient's pathology, anaphylactic reaction to contrast, and stroke and death.    The patient is aware of these risks and agreed to proceed.  Thank you for allowing [place] to participate in this patient's care.   Adele Barthel, MD, FACS [facility] Office: [phone] Pager: [phone]  09/30/2017, 7:48 PM

## 2017-09-30 NOTE — ED Notes (Signed)
Portable xray at bedside.

## 2017-09-30 NOTE — Consult Note (Addendum)
Requested by:  Dr. Elnora Morrison [facility] ED)  Reason for consultation: Left leg ischemia    History of Present Illness   Jordan Arellano is a 66 y.o. (05/26/1951) male who presents with cc: left leg pain and numbness.  Pt is s/p L CFA to BK pop BPG w/ Propaten with Dr. Donnetta Hutching on 12/17/16.  Pt notes acute onset of left leg pain at 4 PM today.  He denies any abnormal heart beats.  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  Pt last ate this morning.  Past Medical History:  Diagnosis Date  . Depression   . Hyperlipidemia   . Hypertension   . PAD (peripheral artery disease) ([place]) ~2007   s/p R BKA for non-healing wound  . Stroke ([place]) 03/2014   MRI: Acute nonhemorrhagic left paracentral pontine infarct. 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  Marland Kitchen TIA (transient ischemic attack) 01/2014    Past Surgical History:  Procedure Laterality Date  . BELOW KNEE LEG AMPUTATION Right   . FEMORAL-POPLITEAL BYPASS GRAFT Left 12/17/2016   Procedure: BYPASS LEFT FEMORAL TO BELOW POPLITEAL ARTERY USING PROPATEN GORE GRAFT;  Surgeon: Rosetta Posner, MD;  Location: [place];  Service: Vascular;  Laterality: Left;  . PERIPHERAL VASCULAR CATHETERIZATION Left 12/14/2016   Procedure: Abdominal Aortogram w/Lower Extremity;  Surgeon: Angelia Mould, MD;  Location: [place] [facility];  Service: Cardiovascular;  Laterality: Left;  . TRANSTHORACIC ECHOCARDIOGRAM  01/2014   To evaluate possible CVA: EF 55-60%. GR 1 DD. No significant valvular lesions     Social History   Social History  . Marital status: Single    Spouse name: N/A  . Number of children: N/A  . Years of education: N/A   Occupational History  . Not on file.   Social History Main Topics  . Smoking status: Current Some Day Smoker    Packs/day: 0.50    Years: 35.00    Types: Cigarettes  . Smokeless tobacco: Never Used    . Alcohol use No  . Drug use: No  . Sexual activity: Not on file   Other Topics Concern  . Not on file   Social History Narrative  . No narrative on file    Family History  Problem Relation Age of Onset  . Hypertension Mother        Does not know history  . Heart disease Mother   . Stroke Mother   . Diabetes Mother   . Hypertension Father   . Heart disease Father   . Stroke Father   . Diabetes Sister   . Hypertension Sister   . Heart disease Brother   . Hypertension Brother     No current facility-administered medications on file prior to encounter.    Current Outpatient Prescriptions on File Prior to Encounter  Medication Sig Dispense Refill  . amLODipine (NORVASC) 5 MG tablet Take 1 tablet (5 mg total) by mouth daily. 90 tablet 1  . aspirin 81 MG tablet Take 81 mg by mouth daily.    Marland Kitchen atorvastatin (LIPITOR) 40 MG tablet Take 1 tablet (40 mg total) by mouth daily at 6 PM. Pt need to see PCP for more refills. 90 tablet 0  . blood glucose meter kit and supplies KIT Dispense based on patient and insurance preference. Use two times daily as directed (before breakfast and at bedtime. (  FOR ICD-10: E08.42) 1 each 2  . diphenhydramine-acetaminophen (TYLENOL PM) 25-500 MG TABS tablet Take 2 tablets by mouth at bedtime as needed.    . fenofibrate (TRICOR) 145 MG tablet Take 1 tablet (145 mg total) by mouth daily. 90 tablet 0  . hydrochlorothiazide (HYDRODIURIL) 12.5 MG tablet Take 1 tablet (12.5 mg total) by mouth daily. Need to see PCP for future refills. 90 tablet 0  . insulin glargine (LANTUS) 100 unit/mL SOPN Inject 0.15 mLs (15 Units total) into the skin at bedtime. 3 pen 1  . Insulin Pen Needle (PEN NEEDLES) 31G X 6 MM MISC 1 application by Does not apply route at bedtime. 100 each 2  . metFORMIN (GLUCOPHAGE) 500 MG tablet Take 1 tablet (500 mg total) by mouth 2 (two) times daily with a meal. 180 tablet 0  . olmesartan (BENICAR) 40 MG tablet Take 1 tablet (40 mg total) by  mouth daily. 90 tablet 1  . omega-3 acid ethyl esters (LOVAZA) 1 g capsule Take 2 capsules (2 g total) by mouth 2 (two) times daily. 360 capsule 3  . potassium chloride SA (K-DUR,KLOR-CON) 10 MEQ tablet Take 1 tablet (10 mEq total) by mouth daily. 30 tablet 3  . pregabalin (LYRICA) 75 MG capsule Take 1 capsule (75 mg total) by mouth 2 (two) times daily. 60 capsule 0  . sertraline (ZOLOFT) 100 MG tablet Take 1 tablet (100 mg total) by mouth daily. 90 tablet 1     Current Facility-Administered Medications  Medication Dose Route Frequency Provider Last Rate Last Dose  . heparin ADULT infusion 100 units/mL (25000 units/247m sodium chloride 0.45%)  1,200 Units/hr Intravenous Continuous ZElnora Morrison MD 12 mL/hr at 09/30/17 1903 1,200 Units/hr at 09/30/17 1903   Facility-Administered Medications Ordered in Other Encounters  Medication Dose Route Frequency Provider Last Rate Last Dose  . lactated ringers infusion    Continuous PRN BClaris Che CRNA        Allergies  Allergen Reactions  . No Known Allergies     REVIEW OF SYSTEMS (negative unless checked):   Cardiac:  _0  Chest pain or chest pressure? _1  Shortness of breath upon activity? _2  Shortness of breath when lying flat? _3  Irregular heart rhythm?  Vascular:  _4  Pain in calf, thigh, or hip brought on by walking? _5  Pain in feet at night that wakes you up from your sleep? _6  Blood clot in your veins? _7  Leg swelling?  Pulmonary:  _8  Oxygen at home? _9  Productive cough? _10  Wheezing?  Neurologic:  _11  Sudden weakness in arms or legs? _12  Sudden numbness in arms or legs? _13  Sudden onset of difficult speaking or slurred speech? _14  Temporary loss of vision in one eye? _15  Problems with dizziness?  Gastrointestinal:  _16  Blood in stool? _17  Vomited blood?  Genitourinary:  _18  Burning when urinating? _19  Blood in urine?  Psychiatric:  _20  Major depression  Hematologic:  _21  Bleeding problems? _22  Problems with blood  clotting?  Dermatologic:  _23  Rashes or ulcers?  Constitutional:  _24  Fever or chills?  Ear/Nose/Throat:  _25  Change in hearing? _26  Nose bleeds? _27  Sore throat?  Musculoskeletal:  _28  Back pain? _29  Joint pain? _30  Muscle pain?   For VQI Use Only   PRE-ADM LIVING Home  AMB STATUS Ambulatory  CAD Sx None  PRIOR CHF None  STRESS TEST No    Physical Examination     Vitals:   09/30/17 1745 09/30/17 1800 09/30/17 1830 09/30/17 1845  BP: 117/65 110/73 122/75 132/76  Pulse:  72 (!) 57 69 (!) 54  Resp: _0 Temp:      TempSrc:      SpO2: 100% 100% 99% 100%  Weight:      Height:       Body mass index is 20.98 kg/m.  General Alert, O x 3, WD, NAD  Head [place]/AT,    Ear/Nose/ Throat Hearing grossly intact, nares without erythema or drainage, oropharynx without Erythema or Exudate, Mallampati score: 3,   Eyes PERRLA, EOMI,    Neck Supple, mid-line trachea,    Pulmonary Sym exp, good B air movt, CTA B  Cardiac RRR, Nl S1, S2, no Murmurs, No rubs, No S3,S4  Vascular Vessel Right Left  Radial Palpable Palpable  Brachial Palpable Palpable  Carotid Palpable, No Bruit Palpable, No Bruit  Aorta Not palpable N/A  Femoral Not palpable Faintly palpable  Popliteal Not palpable Not palpable  PT BKA Not palpable  DP BKA Not palpable    Gastro- intestinal soft, non-distended, non-tender to palpation, No guarding or rebound, no HSM, no masses, no CVAT B, No palpable prominent aortic pulse, umbilical hernia  Musculo- skeletal M/S 5/5 throughout except R BKA and L lower leg 3-4/5  , R BKA, L lower leg mottled in appearance  , No edema present, No visible varicosities , No Lipodermatosclerosis present  Neurologic Cranial nerves 2-12 intact , Pain and light touch intact in extremitiesexcept decreased sensation in L foot, R BKA, Motor exam as listed above  Psychiatric Judgement intact, Mood & affect appropriate for pt's clinical situation  Dermatologic See M/S exam for extremity  exam, No rashes otherwise noted  Lymphatic  Palpable lymph nodes: None    Laboratory   CBC CBC Latest Ref Rng & Units 09/30/2017 09/30/2017 12/19/2016  WBC 4.0 - 10.5 K/uL - 15.3(H) 11.1(H)  Hemoglobin 13.0 - 17.0 g/dL 13.9 14.2 11.4(L)  Hematocrit 39.0 - 52.0 % 41.0 42.6 34.5(L)  Platelets 150 - 400 K/uL - 219 261    BMP BMP Latest Ref Rng & Units 09/30/2017 09/30/2017 05/12/2017  Glucose 65 - 99 mg/dL 160(H) 157(H) 281(H)  BUN 6 - 20 mg/dL 22(H) 19 31(H)  Creatinine 0.61 - 1.24 mg/dL 1.20 1.31(H) 1.44  Sodium 135 - 145 mmol/L 140 138 136  Potassium 3.5 - 5.1 mmol/L 4.4 4.4 4.1  Chloride 101 - 111 mmol/L 105 105 102  CO2 22 - 32 mmol/L - 22 27  Calcium 8.9 - 10.3 mg/dL - 8.8(L) 9.8    Coagulation Lab Results  Component Value Date   INR 1.00 09/30/2017   INR 1.08 12/14/2016   INR 1.01 03/25/2014   No results found for: PTT  Lipids    Component Value Date/Time   CHOL 229 (H) 05/12/2017 1205   TRIG (H) 05/12/2017 1205    662.0 Triglyceride is over 400; calculations on Lipids are invalid.   HDL 27.90 (L) 05/12/2017 1205   CHOLHDL 8 05/12/2017 1205   VLDL UNABLE TO CALCULATE IF TRIGLYCERIDE OVER 400 mg/dL 03/27/2014 0536   LDLCALC UNABLE TO CALCULATE IF TRIGLYCERIDE OVER 400 mg/dL 03/27/2014 0536   LDLDIRECT 93.0 05/12/2017 1205    Medical Decision Making   Jordan Arellano is a 66 y.o. 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  A femoropopliteal bypass with graft should last 2-3 years, but  this patient's bypass has only lasted slightly over 9 months.  I have concerns that the cause of this patient's thrombosed femoropopliteal bypass is progression of tibial disease.  If this is such, thrombectomy of the femoropopliteal bypass is unlikely to have any lasting patency.  In this patient  with no left foot wounds, I would NOT do a femorotibial bypass with prosthetic as it would not have any lasting patency anyway.  I discussed with patient emergently proceeding with: Left leg angiogram, possible left femoropopliteal bypass, possible redo bypass. The risk, benefits, and alternative for bypass operations were discussed with the patient.   The patient is aware the risks include but are not limited to: bleeding, infection, myocardial infarction, stroke, limb loss, nerve damage, limb edema, need for additional procedures in the future, wound complications, and inability to complete the bypass.  I discussed with the patient the nature of angiographic procedures, especially the limited patencies of any endovascular intervention.   The patient is aware of that the risks of an angiographic procedure include but are not limited to: bleeding, infection, access site complications, renal failure, embolization, rupture of vessel, dissection, arteriovenous fistula, possible need for emergent surgical intervention, possible need for surgical procedures to treat the patient's pathology, anaphylactic reaction to contrast, and stroke and death.   The patient is aware of these risks and agreed to proceed.  Thank you for allowing [place] to participate in this patient's care.   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  Might consider endarterectomy of such in the event can get bypass back open.  Adele Barthel, MD, FACS [facility] [place] Office: [phone] Pager: [phone]  09/30/2017, 8:15 PM

## 2017-09-30 NOTE — H&P (View-Only) (Deleted)
Requested by:  Dr. Elnora Morrison [facility])  Reason for consultation: Left leg ischemia    History of Present Illness   Jordan Arellano is a 66 y.o. (05/26/1951) male who presents with cc: left leg pain and numbness.  Pt is s/p L CFA to BK pop BPG w/ Propaten with Dr. Donnetta Hutching on 12/17/16.  Pt notes acute onset of left leg pain at 4 PM today.  He denies any abnormal heart beats.  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  Pt last ate this morning.  Past Medical History:  Diagnosis Date  . Depression   . Hyperlipidemia   . Hypertension   . PAD (peripheral artery disease) ([place]) ~2007   s/p R BKA for non-healing wound  . Stroke ([place]) 03/2014   MRI: Acute nonhemorrhagic left paracentral pontine infarct. 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  Marland Kitchen TIA (transient ischemic attack) 01/2014    Past Surgical History:  Procedure Laterality Date  . BELOW KNEE LEG AMPUTATION Right   . FEMORAL-POPLITEAL BYPASS GRAFT Left 12/17/2016   Procedure: BYPASS LEFT FEMORAL TO BELOW POPLITEAL ARTERY USING PROPATEN GORE GRAFT;  Surgeon: Rosetta Posner, MD;  Location: [facility];  Service: Vascular;  Laterality: Left;  . PERIPHERAL VASCULAR CATHETERIZATION Left 12/14/2016   Procedure: Abdominal Aortogram w/Lower Extremity;  Surgeon: Angelia Mould, MD;  Location: [facility];  Service: Cardiovascular;  Laterality: Left;  . TRANSTHORACIC ECHOCARDIOGRAM  01/2014   To evaluate possible CVA: EF 55-60%. GR 1 DD. No significant valvular lesions     Social History   Social History  . Marital status: Single    Spouse name: N/A  . Number of children: N/A  . Years of education: N/A   Occupational History  . Not on file.   Social History Main Topics  . Smoking status: Current Some Day Smoker    Packs/day: 0.50    Years: 35.00    Types: Cigarettes  . Smokeless tobacco: Never Used    . Alcohol use No  . Drug use: No  . Sexual activity: Not on file   Other Topics Concern  . Not on file   Social History Narrative  . No narrative on file    Family History  Problem Relation Age of Onset  . Hypertension Mother        Does not know history  . Heart disease Mother   . Stroke Mother   . Diabetes Mother   . Hypertension Father   . Heart disease Father   . Stroke Father   . Diabetes Sister   . Hypertension Sister   . Heart disease Brother   . Hypertension Brother     No current facility-administered medications on file prior to encounter.    Current Outpatient Prescriptions on File Prior to Encounter  Medication Sig Dispense Refill  . amLODipine (NORVASC) 5 MG tablet Take 1 tablet (5 mg total) by mouth daily. 90 tablet 1  . aspirin 81 MG tablet Take 81 mg by mouth daily.    Marland Kitchen atorvastatin (LIPITOR) 40 MG tablet Take 1 tablet (40 mg total) by mouth daily at 6 PM. Pt need to see PCP for more refills. 90 tablet 0  . blood glucose meter kit and supplies KIT Dispense based on patient and insurance preference. Use two times daily as directed (before breakfast and at bedtime. (  FOR ICD-10: E08.42) 1 each 2  . diphenhydramine-acetaminophen (TYLENOL PM) 25-500 MG TABS tablet Take 2 tablets by mouth at bedtime as needed.    . fenofibrate (TRICOR) 145 MG tablet Take 1 tablet (145 mg total) by mouth daily. 90 tablet 0  . hydrochlorothiazide (HYDRODIURIL) 12.5 MG tablet Take 1 tablet (12.5 mg total) by mouth daily. Need to see PCP for future refills. 90 tablet 0  . insulin glargine (LANTUS) 100 unit/mL SOPN Inject 0.15 mLs (15 Units total) into the skin at bedtime. 3 pen 1  . Insulin Pen Needle (PEN NEEDLES) 31G X 6 MM MISC 1 application by Does not apply route at bedtime. 100 each 2  . metFORMIN (GLUCOPHAGE) 500 MG tablet Take 1 tablet (500 mg total) by mouth 2 (two) times daily with a meal. 180 tablet 0  . olmesartan (BENICAR) 40 MG tablet Take 1 tablet (40 mg total) by  mouth daily. 90 tablet 1  . omega-3 acid ethyl esters (LOVAZA) 1 g capsule Take 2 capsules (2 g total) by mouth 2 (two) times daily. 360 capsule 3  . potassium chloride SA (K-DUR,KLOR-CON) 10 MEQ tablet Take 1 tablet (10 mEq total) by mouth daily. 30 tablet 3  . pregabalin (LYRICA) 75 MG capsule Take 1 capsule (75 mg total) by mouth 2 (two) times daily. 60 capsule 0  . sertraline (ZOLOFT) 100 MG tablet Take 1 tablet (100 mg total) by mouth daily. 90 tablet 1     Current Facility-Administered Medications  Medication Dose Route Frequency Provider Last Rate Last Dose  . heparin ADULT infusion 100 units/mL (25000 units/247m sodium chloride 0.45%)  1,200 Units/hr Intravenous Continuous ZElnora Morrison MD 12 mL/hr at 09/30/17 1903 1,200 Units/hr at 09/30/17 1903   Facility-Administered Medications Ordered in Other Encounters  Medication Dose Route Frequency Provider Last Rate Last Dose  . lactated ringers infusion    Continuous PRN BClaris Che CRNA        Allergies  Allergen Reactions  . No Known Allergies     REVIEW OF SYSTEMS (negative unless checked):   Cardiac:  _0  Chest pain or chest pressure? _1  Shortness of breath upon activity? _2  Shortness of breath when lying flat? _3  Irregular heart rhythm?  Vascular:  _4  Pain in calf, thigh, or hip brought on by walking? _5  Pain in feet at night that wakes you up from your sleep? _6  Blood clot in your veins? _7  Leg swelling?  Pulmonary:  _8  Oxygen at home? _9  Productive cough? _10  Wheezing?  Neurologic:  _11  Sudden weakness in arms or legs? _12  Sudden numbness in arms or legs? _13  Sudden onset of difficult speaking or slurred speech? _14  Temporary loss of vision in one eye? _15  Problems with dizziness?  Gastrointestinal:  _16  Blood in stool? _17  Vomited blood?  Genitourinary:  _18  Burning when urinating? _19  Blood in urine?  Psychiatric:  _20  Major depression  Hematologic:  _21  Bleeding problems? _22  Problems with blood  clotting?  Dermatologic:  _23  Rashes or ulcers?  Constitutional:  _24  Fever or chills?  Ear/Nose/Throat:  _25  Change in hearing? _26  Nose bleeds? _27  Sore throat?  Musculoskeletal:  _28  Back pain? _29  Joint pain? _30  Muscle pain?   For VQI Use Only   PRE-ADM LIVING Home  AMB STATUS Ambulatory  CAD Sx None  PRIOR CHF None  STRESS TEST No    Physical Examination     Vitals:   09/30/17 1745 09/30/17 1800 09/30/17 1830 09/30/17 1845  BP: 117/65 110/73 122/75 132/76  Pulse:  72 (!) 57 69 (!) 54  Resp: _0 Temp:      TempSrc:      SpO2: 100% 100% 99% 100%  Weight:      Height:       Body mass index is 20.98 kg/m.  General Alert, O x 3, WD, NAD  Head [place]/AT,    Ear/Nose/ Throat Hearing grossly intact, nares without erythema or drainage, oropharynx without Erythema or Exudate, Mallampati score: 3,   Eyes PERRLA, EOMI,    Neck Supple, mid-line trachea,    Pulmonary Sym exp, good B air movt, CTA B  Cardiac RRR, Nl S1, S2, no Murmurs, No rubs, No S3,S4  Vascular Vessel Right Left  Radial Palpable Palpable  Brachial Palpable Palpable  Carotid Palpable, No Bruit Palpable, No Bruit  Aorta Not palpable N/A  Femoral Not palpable Faintly palpable  Popliteal Not palpable Not palpable  PT BKA Not palpable  DP BKA Not palpable    Gastro- intestinal soft, non-distended, non-tender to palpation, No guarding or rebound, no HSM, no masses, no CVAT B, No palpable prominent aortic pulse, umbilical hernia  Musculo- skeletal M/S 5/5 throughout except R BKA and L lower leg 3-4/5  , R BKA, L lower leg mottled in appearance  , No edema present, No visible varicosities , No Lipodermatosclerosis present  Neurologic Cranial nerves 2-12 intact , Pain and light touch intact in extremitiesexcept decreased sensation in L foot, R BKA, Motor exam as listed above  Psychiatric Judgement intact, Mood & affect appropriate for pt's clinical situation  Dermatologic See M/S exam for extremity  exam, No rashes otherwise noted  Lymphatic  Palpable lymph nodes: None    Laboratory   CBC CBC Latest Ref Rng & Units 09/30/2017 09/30/2017 12/19/2016  WBC 4.0 - 10.5 K/uL - 15.3(H) 11.1(H)  Hemoglobin 13.0 - 17.0 g/dL 13.9 14.2 11.4(L)  Hematocrit 39.0 - 52.0 % 41.0 42.6 34.5(L)  Platelets 150 - 400 K/uL - 219 261    BMP BMP Latest Ref Rng & Units 09/30/2017 09/30/2017 05/12/2017  Glucose 65 - 99 mg/dL 160(H) 157(H) 281(H)  BUN 6 - 20 mg/dL 22(H) 19 31(H)  Creatinine 0.61 - 1.24 mg/dL 1.20 1.31(H) 1.44  Sodium 135 - 145 mmol/L 140 138 136  Potassium 3.5 - 5.1 mmol/L 4.4 4.4 4.1  Chloride 101 - 111 mmol/L 105 105 102  CO2 22 - 32 mmol/L - 22 27  Calcium 8.9 - 10.3 mg/dL - 8.8(L) 9.8    Coagulation Lab Results  Component Value Date   INR 1.00 09/30/2017   INR 1.08 12/14/2016   INR 1.01 03/25/2014   No results found for: PTT  Lipids    Component Value Date/Time   CHOL 229 (H) 05/12/2017 1205   TRIG (H) 05/12/2017 1205    662.0 Triglyceride is over 400; calculations on Lipids are invalid.   HDL 27.90 (L) 05/12/2017 1205   CHOLHDL 8 05/12/2017 1205   VLDL UNABLE TO CALCULATE IF TRIGLYCERIDE OVER 400 mg/dL 03/27/2014 0536   LDLCALC UNABLE TO CALCULATE IF TRIGLYCERIDE OVER 400 mg/dL 03/27/2014 0536   LDLDIRECT 93.0 05/12/2017 1205    Medical Decision Making   Jordan Arellano is a 66 y.o. 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  A femoropopliteal bypass with graft should last 2-3 years, but  this patient's bypass has even lasted much more than 9 month.  I have concerns that the cause of this patient's thrombosed femoropopliteal bypass is progression of tibial disease.  If this is such, thrombectomy of the femoropopliteal bypass is unlikely to have any lasting patency.  In this patient  with no left foot wounds, I would NOT do a femorotibial bypass with prosthetic as it would not have any lasting patency anyway.  I discussed with patient emergently proceeding with: Left leg angiogram, possible left femoropopliteal bypass, possible redo bypass. The risk, benefits, and alternative for bypass operations were discussed with the patient.   The patient is aware the risks include but are not limited to: bleeding, infection, myocardial infarction, stroke, limb loss, nerve damage, limb edema, need for additional procedures in the future, wound complications, and inability to complete the bypass.  I discussed with the patient the nature of angiographic procedures, especially the limited patencies of any endovascular intervention.   The patient is aware of that the risks of an angiographic procedure include but are not limited to: bleeding, infection, access site complications, renal failure, embolization, rupture of vessel, dissection, arteriovenous fistula, possible need for emergent surgical intervention, possible need for surgical procedures to treat the patient's pathology, anaphylactic reaction to contrast, and stroke and death.   The patient is aware of these risks and agreed to proceed.  Thank you for allowing [place] to participate in this patient's care.   Adele Barthel, MD, FACS [facility] Office: [phone] Pager: [phone]  09/30/2017, 7:48 PM

## 2017-09-30 NOTE — Anesthesia Preprocedure Evaluation (Signed)
Anesthesia Evaluation  Patient identified by MRN, date of birth, ID band Patient awake    Reviewed: Allergy & Precautions, NPO status , Patient's Chart, lab work & pertinent test results  Airway Mallampati: I  TM Distance: >3 FB Neck ROM: Full    Dental   Pulmonary Current Smoker,    Pulmonary exam normal        Cardiovascular hypertension, Pt. on medications + Peripheral Vascular Disease  Normal cardiovascular exam     Neuro/Psych Anxiety Depression TIACVA    GI/Hepatic   Endo/Other  diabetes, Type 2, Oral Hypoglycemic Agents  Renal/GU      Musculoskeletal   Abdominal   Peds  Hematology   Anesthesia Other Findings   Reproductive/Obstetrics                             Anesthesia Physical Anesthesia Plan  ASA: III and emergent  Anesthesia Plan: General   Post-op Pain Management:    Induction: Intravenous  PONV Risk Score and Plan: 1 and Ondansetron and Treatment may vary due to age or medical condition  Airway Management Planned: Oral ETT  Additional Equipment:   Intra-op Plan:   Post-operative Plan: Extubation in OR  Informed Consent: I have reviewed the patients History and Physical, chart, labs and discussed the procedure including the risks, benefits and alternatives for the proposed anesthesia with the patient or authorized representative who has indicated his/her understanding and acceptance.     Plan Discussed with: CRNA and Surgeon  Anesthesia Plan Comments:         Anesthesia Quick Evaluation

## 2017-10-01 DIAGNOSIS — I739 Peripheral vascular disease, unspecified: Secondary | ICD-10-CM | POA: Diagnosis present

## 2017-10-01 DIAGNOSIS — I70229 Atherosclerosis of native arteries of extremities with rest pain, unspecified extremity: Secondary | ICD-10-CM | POA: Diagnosis present

## 2017-10-01 LAB — BASIC METABOLIC PANEL
Anion gap: 9 (ref 5–15)
BUN: 18 mg/dL (ref 6–20)
CALCIUM: 7.9 mg/dL — AB (ref 8.9–10.3)
CO2: 22 mmol/L (ref 22–32)
CREATININE: 1.33 mg/dL — AB (ref 0.61–1.24)
Chloride: 106 mmol/L (ref 101–111)
GFR, EST NON AFRICAN AMERICAN: 55 mL/min — AB (ref >60)
GLUCOSE: 98 mg/dL (ref 65–99)
Potassium: 3.7 mmol/L (ref 3.5–5.1)
Sodium: 137 mmol/L (ref 135–145)

## 2017-10-01 LAB — HEPARIN LEVEL (UNFRACTIONATED)
HEPARIN UNFRACTIONATED: 0.19 [IU]/mL — AB (ref 0.30–0.70)
Heparin Unfractionated: 0.24 IU/mL — ABNORMAL LOW (ref 0.30–0.70)

## 2017-10-01 LAB — CBC
HCT: 29.1 % — ABNORMAL LOW (ref 39.0–52.0)
Hemoglobin: 9.5 g/dL — ABNORMAL LOW (ref 13.0–17.0)
MCH: 30.4 pg (ref 26.0–34.0)
MCHC: 32.6 g/dL (ref 30.0–36.0)
MCV: 93 fL (ref 78.0–100.0)
PLATELETS: 170 10*3/uL (ref 150–400)
RBC: 3.13 MIL/uL — AB (ref 4.22–5.81)
RDW: 15.2 % (ref 11.5–15.5)
WBC: 17.8 10*3/uL — AB (ref 4.0–10.5)

## 2017-10-01 LAB — GLUCOSE, CAPILLARY
GLUCOSE-CAPILLARY: 173 mg/dL — AB (ref 65–99)
GLUCOSE-CAPILLARY: 149 mg/dL — AB (ref 65–99)
Glucose-Capillary: 162 mg/dL — ABNORMAL HIGH (ref 65–99)

## 2017-10-01 LAB — POCT I-STAT 4, (NA,K, GLUC, HGB,HCT)
GLUCOSE: 154 mg/dL — AB (ref 65–99)
HEMATOCRIT: 28 % — AB (ref 39.0–52.0)
Hemoglobin: 9.5 g/dL — ABNORMAL LOW (ref 13.0–17.0)
Potassium: 4 mmol/L (ref 3.5–5.1)
SODIUM: 142 mmol/L (ref 135–145)

## 2017-10-01 LAB — HEMOGLOBIN A1C
Hgb A1c MFr Bld: 6 % — ABNORMAL HIGH (ref 4.8–5.6)
MEAN PLASMA GLUCOSE: 125.5 mg/dL

## 2017-10-01 MED ORDER — HYDROMORPHONE HCL 1 MG/ML IJ SOLN: 0.2500 mg | INTRAMUSCULAR | Status: DC | PRN

## 2017-10-01 MED ORDER — HEPARIN (PORCINE) IN NACL 100-0.45 UNIT/ML-% IJ SOLN
1700.0000 [IU]/h | INTRAMUSCULAR | Status: DC
  Administered 2017-10-01: 1200 [IU]/h via INTRAVENOUS
  Administered 2017-10-02: 1600 [IU]/h via INTRAVENOUS
  Administered 2017-10-02: 1650 [IU]/h via INTRAVENOUS
  Filled 2017-10-01 (×4): qty 250

## 2017-10-01 MED ORDER — ACETAMINOPHEN 650 MG RE SUPP: 325.0000 mg | RECTAL | Status: DC | PRN

## 2017-10-01 MED ORDER — ONDANSETRON HCL 4 MG/2ML IJ SOLN
INTRAMUSCULAR | Status: DC | PRN
  Administered 2017-10-01: 4 mg via INTRAVENOUS

## 2017-10-01 MED ORDER — SERTRALINE HCL 100 MG PO TABS
100.0000 mg | ORAL_TABLET | Freq: Every day | ORAL | Status: DC
  Administered 2017-10-01 – 2017-10-04 (×4): 100 mg via ORAL
  Filled 2017-10-01 (×4): qty 1

## 2017-10-01 MED ORDER — GUAIFENESIN-DM 100-10 MG/5ML PO SYRP: 15.0000 mL | ORAL_SOLUTION | ORAL | Status: DC | PRN

## 2017-10-01 MED ORDER — ALUM & MAG HYDROXIDE-SIMETH 200-200-20 MG/5ML PO SUSP
15.0000 mL | ORAL | Status: DC | PRN
  Administered 2017-10-01: 30 mL via ORAL
  Filled 2017-10-01: qty 30

## 2017-10-01 MED ORDER — AMLODIPINE BESYLATE 5 MG PO TABS
5.0000 mg | ORAL_TABLET | Freq: Every day | ORAL | Status: DC
  Administered 2017-10-03 – 2017-10-04 (×2): 5 mg via ORAL
  Filled 2017-10-01 (×4): qty 1

## 2017-10-01 MED ORDER — ATORVASTATIN CALCIUM 40 MG PO TABS
40.0000 mg | ORAL_TABLET | Freq: Every day | ORAL | Status: DC
Start: 2017-10-01 — End: 2017-10-04
  Administered 2017-10-01 – 2017-10-03 (×3): 40 mg via ORAL
  Filled 2017-10-01 (×3): qty 1

## 2017-10-01 MED ORDER — POTASSIUM CHLORIDE CRYS ER 20 MEQ PO TBCR: 20.0000 meq | EXTENDED_RELEASE_TABLET | Freq: Every day | ORAL | Status: DC | PRN

## 2017-10-01 MED ORDER — ONDANSETRON HCL 4 MG/2ML IJ SOLN: 4.0000 mg | Freq: Four times a day (QID) | INTRAMUSCULAR | Status: DC | PRN

## 2017-10-01 MED ORDER — DEXTROSE 5 % IV SOLN
750.0000 mg | INTRAVENOUS | Status: DC
  Filled 2017-10-01: qty 750

## 2017-10-01 MED ORDER — SODIUM CHLORIDE 0.9 % IV SOLN
INTRAVENOUS | Status: DC
  Administered 2017-10-01 – 2017-10-04 (×6): via INTRAVENOUS

## 2017-10-01 MED ORDER — ASPIRIN EC 81 MG PO TBEC
81.0000 mg | DELAYED_RELEASE_TABLET | Freq: Every day | ORAL | Status: DC
  Administered 2017-10-01 – 2017-10-04 (×4): 81 mg via ORAL
  Filled 2017-10-01 (×4): qty 1

## 2017-10-01 MED ORDER — MAGNESIUM SULFATE 2 GM/50ML IV SOLN
2.0000 g | Freq: Every day | INTRAVENOUS | Status: DC | PRN
  Filled 2017-10-01: qty 50

## 2017-10-01 MED ORDER — DOCUSATE SODIUM 100 MG PO CAPS
100.0000 mg | ORAL_CAPSULE | Freq: Every day | ORAL | Status: DC
  Administered 2017-10-02 – 2017-10-04 (×3): 100 mg via ORAL
  Filled 2017-10-01 (×3): qty 1

## 2017-10-01 MED ORDER — PREGABALIN 50 MG PO CAPS
75.0000 mg | ORAL_CAPSULE | Freq: Two times a day (BID) | ORAL | Status: DC
  Administered 2017-10-01 – 2017-10-04 (×7): 75 mg via ORAL
  Filled 2017-10-01 (×7): qty 1

## 2017-10-01 MED ORDER — LABETALOL HCL 5 MG/ML IV SOLN: 10.0000 mg | INTRAVENOUS | Status: DC | PRN

## 2017-10-01 MED ORDER — INSULIN ASPART 100 UNIT/ML ~~LOC~~ SOLN
0.0000 [IU] | Freq: Three times a day (TID) | SUBCUTANEOUS | Status: DC
  Administered 2017-10-01: 1 [IU] via SUBCUTANEOUS
  Administered 2017-10-01: 2 [IU] via SUBCUTANEOUS
  Administered 2017-10-03: 1 [IU] via SUBCUTANEOUS
  Administered 2017-10-03: 2 [IU] via SUBCUTANEOUS

## 2017-10-01 MED ORDER — OMEGA-3-ACID ETHYL ESTERS 1 G PO CAPS
2.0000 g | ORAL_CAPSULE | Freq: Two times a day (BID) | ORAL | Status: DC
  Administered 2017-10-01 – 2017-10-04 (×7): 2 g via ORAL
  Filled 2017-10-01 (×7): qty 2

## 2017-10-01 MED ORDER — ACETAMINOPHEN 325 MG PO TABS
325.0000 mg | ORAL_TABLET | ORAL | Status: DC | PRN
  Administered 2017-10-02: 650 mg via ORAL
  Filled 2017-10-01: qty 2

## 2017-10-01 MED ORDER — PANTOPRAZOLE SODIUM 40 MG PO TBEC
40.0000 mg | DELAYED_RELEASE_TABLET | Freq: Every day | ORAL | Status: DC
  Administered 2017-10-01 – 2017-10-04 (×4): 40 mg via ORAL
  Filled 2017-10-01 (×4): qty 1

## 2017-10-01 MED ORDER — PNEUMOCOCCAL VAC POLYVALENT 25 MCG/0.5ML IJ INJ
0.5000 mL | INJECTION | INTRAMUSCULAR | Status: DC
  Filled 2017-10-01 (×2): qty 0.5

## 2017-10-01 MED ORDER — HYDROCHLOROTHIAZIDE 25 MG PO TABS
12.5000 mg | ORAL_TABLET | Freq: Every day | ORAL | Status: DC
  Administered 2017-10-02 – 2017-10-04 (×3): 12.5 mg via ORAL
  Filled 2017-10-01 (×5): qty 1

## 2017-10-01 MED ORDER — OXYCODONE HCL 5 MG PO TABS
5.0000 mg | ORAL_TABLET | ORAL | Status: DC | PRN
  Administered 2017-10-01 – 2017-10-04 (×3): 5 mg via ORAL
  Filled 2017-10-01 (×3): qty 1

## 2017-10-01 MED ORDER — HYDRALAZINE HCL 20 MG/ML IJ SOLN: 5.0000 mg | INTRAMUSCULAR | Status: DC | PRN

## 2017-10-01 MED ORDER — METOPROLOL TARTRATE 5 MG/5ML IV SOLN: 2.0000 mg | INTRAVENOUS | Status: DC | PRN

## 2017-10-01 MED ORDER — INSULIN GLARGINE 100 UNIT/ML ~~LOC~~ SOLN
15.0000 [IU] | Freq: Every day | SUBCUTANEOUS | Status: DC
  Administered 2017-10-01 – 2017-10-03 (×3): 15 [IU] via SUBCUTANEOUS
  Filled 2017-10-01 (×4): qty 0.15

## 2017-10-01 MED ORDER — BISACODYL 5 MG PO TBEC: 5.0000 mg | DELAYED_RELEASE_TABLET | Freq: Every day | ORAL | Status: DC | PRN

## 2017-10-01 MED ORDER — MEPERIDINE HCL 25 MG/ML IJ SOLN: 6.2500 mg | INTRAMUSCULAR | Status: DC | PRN

## 2017-10-01 MED ORDER — ONDANSETRON HCL 4 MG/2ML IJ SOLN: 4.0000 mg | Freq: Once | INTRAMUSCULAR | Status: DC | PRN

## 2017-10-01 MED ORDER — METFORMIN HCL 500 MG PO TABS
500.0000 mg | ORAL_TABLET | Freq: Two times a day (BID) | ORAL | Status: DC
  Administered 2017-10-01 – 2017-10-04 (×7): 500 mg via ORAL
  Filled 2017-10-01 (×7): qty 1

## 2017-10-01 MED ORDER — HEMOSTATIC AGENTS (NO CHARGE) OPTIME
TOPICAL | Status: DC | PRN
  Administered 2017-10-01: 1 via TOPICAL

## 2017-10-01 MED ORDER — POTASSIUM CHLORIDE CRYS ER 10 MEQ PO TBCR
10.0000 meq | EXTENDED_RELEASE_TABLET | Freq: Every day | ORAL | Status: DC
  Administered 2017-10-01 – 2017-10-04 (×4): 10 meq via ORAL
  Filled 2017-10-01 (×4): qty 1

## 2017-10-01 MED ORDER — SODIUM CHLORIDE 0.9 % IV SOLN: 500.0000 mL | Freq: Once | INTRAVENOUS | Status: DC | PRN

## 2017-10-01 MED ORDER — MORPHINE SULFATE (PF) 2 MG/ML IV SOLN
1.0000 mg | INTRAVENOUS | Status: DC | PRN
  Administered 2017-10-01: 2 mg via INTRAVENOUS
  Filled 2017-10-01: qty 1

## 2017-10-01 MED ORDER — DEXTROSE 5 % IV SOLN
1.5000 g | Freq: Two times a day (BID) | INTRAVENOUS | Status: AC
  Administered 2017-10-01 (×2): 1.5 g via INTRAVENOUS
  Filled 2017-10-01 (×2): qty 1.5

## 2017-10-01 MED ORDER — SENNOSIDES-DOCUSATE SODIUM 8.6-50 MG PO TABS: 1.0000 | ORAL_TABLET | Freq: Every evening | ORAL | Status: DC | PRN

## 2017-10-01 MED ORDER — PHENOL 1.4 % MT LIQD: 1.0000 | OROMUCOSAL | Status: DC | PRN

## 2017-10-01 NOTE — Progress Notes (Signed)
ANTICOAGULATION CONSULT NOTE   Pharmacy Consult for Heparin  Indication: PVD  No Known Allergies Patient Measurements: Height: 6\' 1"  (185.4 cm) Weight: 157 lb 10.1 oz (71.5 kg) IBW/kg (Calculated) : 79.9  Vital Signs: Temp: 98.4 F (36.9 C) (10/27 2012) Temp Source: Oral (10/27 2012) BP: 97/61 (10/27 2012) Pulse Rate: 41 (10/27 1600)  Labs:  Recent Labs  09/30/17 1830 09/30/17 1836 10/01/17 0043 10/01/17 1410 10/01/17 2304  HGB 14.2 13.9 9.5*  --  9.5*  HCT 42.6 41.0 28.0*  --  29.1*  PLT 219  --   --   --  170  LABPROT 13.1  --   --   --   --   INR 1.00  --   --   --   --   HEPARINUNFRC  --   --   --  0.19* 0.24*  CREATININE 1.31* 1.20  --   --   --     Estimated Creatinine Clearance: 62.1 mL/min (by C-G formula based on SCr of 1.2 mg/dL).  Assessment: 66 y/o M with left leg ischemia and thrombosed left fem-pop bypass  s/p thrombectomy for heparin.   Goal of Therapy:  Heparin level 0.3-0.7 units/ml Monitor platelets by anticoagulation protocol: Yes   Plan:  Increase Heparin  1600 units/hr Follow-up am labs.   Phillis Knack, PharmD, BCPS

## 2017-10-01 NOTE — Op Note (Addendum)
OPERATIVE NOTE   PROCEDURE: 1. Left common femoral artery cannulation under Sonosite guidance 2. Left leg runoff via left femoral sheath 3. Thrombectomy of left femoropopliteal bypass 4. Thrombectomy left external iliac artery, common femoral artery, profunda femoral artery, popliteal artery, tibioperoneal trunk and anterior tibial artery  5. Endarterectomy below-the-knee popliteal artery with bovine patch angioplasty 6. Limited endarterectomy of proximal tibioperoneal trunk and anterior tibial artery   PRE-OPERATIVE DIAGNOSIS: left leg ischemia, thrombosed left femoropopliteal bypass  POST-OPERATIVE DIAGNOSIS: same as above, extensive tibial disease  SURGEON: Adele Barthel, MD  ASSISTANT(S): Gerri Lins, PAC   ANESTHESIA: general  ESTIMATED BLOOD LOSS: 550 cc  CONTRAST: 100 cc  FINDING(S): 1.  Suspicious appearance of left external iliac artery for thromboembolism extending into common femoral artery  2.  Residual profunda femoral flow filling collaterals that reconstitute a popliteal artery which fill diseased tibial arteries despite thrombosed femoropopliteal bypass 3.  Rapid rethrombosis of femoropopliteal graft despite thrombectomy 4.  Extensive disease in distal below-the-knee popliteal artery extending into anterior tibial artery and tibioperoneal trunk  5.  Visibly viable left foot despite no doppler signals on completion  SPECIMEN(S):  none  INDICATIONS:   Jordan Arellano is a 66 y.o. male who presents with acute onset of left foot ischemia in setting of prior femoropopliteal bypass.  I discussed with patient emergently proceeding with: Left leg angiogram, possible left femoropopliteal bypass, possible redo bypass.The risk, benefits, and alternative for bypass operations were discussed with the patient. The patient is aware the risks include but are not limited to: bleeding, infection, myocardial infarction, stroke, limb loss, nerve damage, limb edema, need for  additional procedures in the future, wound complications, and inability to complete the bypass. I discussed with the patient the nature of angiographic procedures, especially the limited patencies of any endovascular intervention. The patient agreed to proceed.   DESCRIPTION: After obtaining full informed written consent, the patient was brought back to the operating room and placed supine upon the operating table.  The patient received IV antibiotics prior to induction.  A procedure time out was completed and the correct surgical site was verified.  After obtaining adequate anesthesia, the patient was prepped and draped in the standard fashion for: left leg bypass.   Under Sonosite guidance, I cannulated the left common femoral artery proximal to the anastomosis with a micropuncture needle.  I passed the microwire up the left iliac arterial system.  I passed the microsheath proximal into the left common femoral artery and external iliac artery over the wire.  I exchanged the microwire for an Amplatz wire.  The sheath was exchanged for a 5-Fr sheath which was loaded into the common femoral artery over the wire.  The sheath was aspirated.  No clots were present and the sheath was reloaded with heparinized saline.  The sheath was connected to the power injector circuit.  The initial injection was suspicious for a embolism into distal external iliac artery extending into common femoral artery.  Interestingly the sheath extended proximal to the thrombus. There appeared to be residual profunda femoral artery flow without obvious source of collaterals feeding profunda femoral artery.  On subsequent injections, there appeared to be an underfed popliteal artery feeding diseased tibioperoneal trunk and anterior tibial artery, so I felt attempt at salvage of the femoropopliteal graft was indicated.  I turned my attention to the left groin.  I made an incision through the prior incision, centered on the sheath.  Using  the sheath to help guide dissection,  I dissected out the left  common femoral artery with profunda femoral artery and what I presumed was the superficial femoral artery.  I carried the dissection to the inguinal ligament and had to dissect out the distal external iliac artery to get control, as the arterial anastomosis extended to level of the inguinal ligament.  The patient was given 7500 units of Heparin intravenously, which was a therapeutic bolus. An additional 2000 units of Heparin was administered every hour after initial bolus to maintain anticoagulation.  In total, 11500 units of Heparin was administrated to achieve and maintain a therapeutic level of anticoagulation.  After waiting three minutes, I placed the external iliac artery, profunda femoral artery and superficial femoral artery under tension.  I removed the prior sheath without any bleeding.    I made an incision in the hood of the graft and extended it proximally and distally with a Potts scissor.  Immediately, sub-acute thrombus was noted.  I removed this and then passed a 3 and 4 Fogarty distally down the femoropopliteal graft.  I was able to extract subacute clot mixed with chronic clot.  I was never able to get backbleeding from the graft.  I then passed a 4 and 5 Fogarty proximally up the external iliac artery.  I obtained some sub-acute thrombus and return of pulsatile bleeding.  I also notice some clot in the profunda femoral artery orifice.  I passed a 3 Fogarty down the profunda femoral artery and had return of vigorous backbleeding.  At this point, no further passes of the Fogarty got clot from any graft or artery.  I repaired the graftotomy with a running stitch of 6-0 Prolene.  All clamps and vessel loops were released.  At this point, I tried to doppler distal pedal signals.  None were present, so I cannulated the femoropopliteal graft with the micropuncture needle and then advanced the microwire into the graft.  The needle was  exchanged for the microsheath.  The dilator and wire were moved and the sheath was connected to the power injector circuit.  The first injection immediately identified rapid rethrombosis of the femoropopliteal graft.  I suspected likely tibial disease or distal popliteal artery disease, so I felt exploration of the distal anastomosis was indicated.  I turned my attention to the calf incision.  I made an incision overlying this prior incision.  Using electrocautery I opened the prior fascia and dissected out the distal femoropopliteal graft with some difficulty due to extensive scarring.  I was able to dissect out the distal femoropopliteal artery down to the bifurcation into anterior tibial artery and tibioperoneal trunk.  The below-the-knee popliteal artery felt diseased, so I felt exploration was needed.  I placed the anterior tibial artery and tibioperoneal trunk under tension.  I made an incision in the below-the-knee popliteal artery artery with a 15-blade.  I then extended the arteriotomy in the below-the-knee popliteal artery and into the toe of the graft with a Potts scissor.  I carried this arteriotomy down to the bifurcation into anterior tibial artery and tibioperoneal trunk.  There was extensive disease in the popliteal artery that extended into the anterior tibial artery and tibioperoneal trunk.  There was evidence of neointimal hyperplasia in the distal femoropopliteal bypass.  I performed an endarterectomy of the neointimal hyperplasia and did a endarterectomy on the distal below-the-knee popliteal artery.  Unfortunately the disease did not feather out well into either orifice though there are was residual lumen.  There was some minimal backbleeding from  the anterior tibial artery but none from the tibioperoneal trunk.  I passed a 3 Fogarty down each tibial artery.  The anterior tibial artery only allowed 10 cm of the catheter.  The tibioperoneal trunk allowed 30 cm of the catheter.  No thrombus was  noted in either artery, suggesting distal disease might have been the reason rapid rethrombosis.  The residual wall of the two tibial arteries and below-the-knee popliteal artery was greatly attenuated due to the extent of endarterectomy needed.  I felt no further intervention was possible.  I passed a 4-Fogarty proximally in the graft, extracting some chronic thrombus.  There was return of pulsatile bleeding.  I obtained a bovine pericardial patch and fashioned it for the geometry of the below-the-knee popliteal artery extending into the tibioperoneal trunk.  I sewed the bovine patch to the below-the-knee popliteal artery extending into the tibioperoneal trunk with two running stitches of 6-0 Prolene, sewing from each end of the patch.  I backbled all arteries and graft.  No clot was noted.  The graft had pulsatile bleeding and the anterior tibial artery continued has some minimal backbleeding.  As previously, no tibioperoneal trunk backbleeding was noted.  I released all clamps and vessel loops.  Immediately multiple bleeding points in the distal patch were noted.  I repaired these with 6-0 Prolene stitches.  The anterior tibioperoneal trunk wall also appeared compromised, so I had to repair this wall with interrupted stitches of 6-0 Prolene.  At this point all bleeding was controlled.  I packed the calf with Avitene.  I turned my attention to the microsheath in the graft.  I placed a 6-0 Prolene Z-stitch around the sheath and removed the sheath while tying down the stitch.  This controlled any bleeding from the graft puncture.  I packed the groin with Avitene.  Distally, I could not obtain doppler signals on any tibial artery.  The below-the-knee popliteal artery had a strong Monophasic signal without a waterhammer.  I could easily feel the pulse in this graft.  I could get no signal in the tibioperoneal trunk and good Monophasic signal in the anterior tibial artery in the popliteal exposure.   At this  point, I felt additional contrast would likely result in contrast induced nephropathy as he started the case with decreased renal function, reportedly Cr 1.6.  Additionally, the images demonstrate marginal distal tibial artery targets even if vein had been available.  I had already discussed with the patient the poor patency associated with femorotibial bypass done with graft, so we had agreed to avoid such.  The left groin was repaired with a deep layer of 2-0 Vicryl, followed by a double layer of 3-0 Vicryl in the subcutaneous tissue, and finally, a 4-0 Monocryl subcuticular stitch.  The skin was cleaned, dried, and reinforced with Dermabond.  The left calf was repaired with a layer of 2-0 Vicryl in the muscle to reattached the soleus muscle.  I then reapproximated the fascia with a running stitch of 3-0 Vicryl.  The subcutaneous tissue was then reapproximated with a running stitch fo 3-0 Vicryl.  Finally, I closed the skin with a running subcuticular stitch of 4-0 Monocryl.  The skin was cleaned, dried, and reinforced with Dermabond.  At the end of the case, the left foot appear viable but I still could not get tibial signals.  In this patient due to extensive tibial and distal popliteal artery disease, I suspect limited patency for this femoropopliteal graft.   COMPLICATIONS: none  CONDITION:  stable   Adele Barthel, MD, FACS [facility] Office: [phone] Pager: [phone]  10/01/2017, 1:11 AM

## 2017-10-01 NOTE — Progress Notes (Addendum)
[facility]  Subjective  - Doing OK.   Objective (!) 96/59 74 98.3 F (36.8 C) (Oral) 18 97%  Intake/Output Summary (Last 24 hours) at 10/01/17 0807 Last data filed at 10/01/17 0701  Gross per 24 hour  Intake             2900 ml  Output             1325 ml  Net             1575 ml    Left LE foot active range of motion intact Popliteal doppler signal, no pedal signals Heart RRR Lungs non labored breathing   Assessment/Planning: POD #1  PROCEDURE: 1. Left common femoral artery cannulation under Sonosite guidance 2. Left leg runoff via left femoral sheath 3. Thrombectomy of femoropopliteal bypass 4. Thrombectomy external iliac artery, common femoral artery, popliteal artery, tibioperoneal trunk and anterior tibial artery  5. Endarterectomy below-the-knee popliteal artery with bovine patch angioplasty 6. Limited endarterectomy of proximal tibioperoneal trunk and anterior tibial artery  Full dose heparin Will d/c foley Encourage mobility  COLLINS, [place] 10/01/2017 8:07 AM    Addendum  I have independently interviewed and examined the patient, and I agree with the physician assistant's findings.  Left foot no long looks mottle and motor and sensation restored.  Suspect multiple potential etiologies for occlusion of femoropopliteal graft:  1.  Severe tibial disease: initial angiogram prior to thrombectomy demonstrates limited profunda femoral artery collaterals to popliteal artery.  The tibial arteries filled by these collaterals are poor targets for bypass with a prosthetic graft.  2. Severe distal below-the-knee pop: despite endarterectomy and bovine patch angioplasty, the flow into the anterior tibial artery and tibioperoneal trunk were severely compromised by orificial disease extending into each artery.  Long-term patency of this femoropopliteal graft remains questionable.  3.  Unclear if thromboembolism to left external iliac  artery: suspect patient had thrombosis of femoropopliteal graft that backed into distal external iliac artery, as the graft hood covered much of the common femoral artery up to the inguinal ligament due to high femoral bifurcation.  The appearance of the distal external iliac artery on initial angiogram however suspicious for embolism.  Will continue anticoagulation with heparin today.  If no bleeding, will switch to coumadin or NOAC for 3 months.  - PT/OT - ABI - Home once moving better - Pt out of threatened limb.  I would not be surprised if he eventually reclots his fempop graft and develops rest pain if not recurrent foot ulcer.  If this happens, probably will need to consider L BKA vs AKA.   Adele Barthel, MD, FACS [facility] Office: [phone] Pager: [phone]  10/01/2017, 9:18 AM   --  Laboratory Lab Results:  Recent Labs  09/30/17 1830 09/30/17 1836 10/01/17 0043  WBC 15.3*  --   --   HGB 14.2 13.9 9.5*  HCT 42.6 41.0 28.0*  PLT 219  --   --    BMET  Recent Labs  09/30/17 1830 09/30/17 1836 10/01/17 0043  NA 138 140 142  K 4.4 4.4 4.0  CL 105 105  --   CO2 22  --   --   GLUCOSE 157* 160* 154*  BUN 19 22*  --   CREATININE 1.31* 1.20  --   CALCIUM 8.8*  --   --     COAG Lab Results  Component Value Date   INR 1.00 09/30/2017  INR 1.08 12/14/2016   INR 1.01 03/25/2014   No results found for: PTT

## 2017-10-01 NOTE — Progress Notes (Signed)
ANTICOAGULATION CONSULT NOTE - Initial Consult  Pharmacy Consult for Heparin  Indication: Left leg ischemia, s/p thrombectomy in OR 10/26  Allergies  Allergen Reactions  . No Known Allergies    Patient Measurements: Height: 6\' 1"  (185.4 cm) Weight: 157 lb 10.1 oz (71.5 kg) IBW/kg (Calculated) : 79.9  Vital Signs: Temp: 98.2 F (36.8 C) (10/27 0229) Temp Source: Oral (10/27 0229) BP: 96/59 (10/27 0229) Pulse Rate: 74 (10/27 0229)  Labs:  Recent Labs  09/30/17 1830 09/30/17 1836 10/01/17 0043  HGB 14.2 13.9 9.5*  HCT 42.6 41.0 28.0*  PLT 219  --   --   LABPROT 13.1  --   --   INR 1.00  --   --   CREATININE 1.31* 1.20  --     Estimated Creatinine Clearance: 62.1 mL/min (by C-G formula based on SCr of 1.2 mg/dL).  Medical History: Past Medical History:  Diagnosis Date  . Depression   . Hyperlipidemia   . Hypertension   . PAD (peripheral artery disease) ([employer]) ~2007   s/p R BKA for non-healing wound  . Stroke ([place]) 03/2014   MRI: Acute nonhemorrhagic left paracentral pontine infarct. 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  Marland Kitchen TIA (transient ischemic attack) 01/2014   Assessment: 66 y/o M with left leg ischemia and thrombosed left fem-pop bypass now s/p OR with thrombectomy. To resume full dose heparin drip at 0700 per vascular surgery. Hgb down post-op (watch).  Goal of Therapy:  Heparin level 0.3-0.7 units/ml Monitor platelets by anticoagulation protocol: Yes   Plan:  -NO BOLUS -Start heparin drip at 1200 units/hr at 0700  -1400 HL -Daily CBC/HL -Monitor for bleeding/trend Hgb  Narda Bonds 10/01/2017,2:56 AM

## 2017-10-01 NOTE — Evaluation (Signed)
Physical Therapy Evaluation Patient Details Name: Jordan Arellano MRN: [id number] DOB: 1951-08-09 Today's Date: 10/01/2017   History of Present Illness  pt is a 66 y/o male with pmh significant for Stroke, PAD, HTN, R BKA and L LE BPGing, admitted with sudden onset of L LE pain and numbness, s/p L common fem art cannulation, thrombectomy of fem-pop BPG and multiple important arteries, endarterectomy of BK pop art.  Clinical Impression  Pt admitted with/for L LE ischemia s/p L LE vascular surgery.  Pt without prosthesis, so at this time needs mod assist for mobility.  Pt also does not have a definitive d/c plan which includes assistance.  Pt currently limited functionally due to the problems listed below.  (see problems list.)  Pt will benefit from PT to maximize function and safety to be able to get home safely with available assist.     Follow Up Recommendations Home health PT;Supervision/Assistance - 24 hour (SNF if no d/c plan with 24 hour assist)    Equipment Recommendations  None recommended by PT;Other (comment) (TBA)    Recommendations for Other Services       Precautions / Restrictions Precautions Precautions: Fall      Mobility  Bed Mobility Overal bed mobility: Needs Assistance Bed Mobility: Supine to Sit     Supine to sit: Supervision     General bed mobility comments: slow , but able to build some momentum and use rail to come up.  Transfers Overall transfer level: Needs assistance Equipment used: Rolling walker (2 wheeled) Transfers: Sit to/from [facility] Sit to Stand: Mod assist Stand pivot transfers: Mod assist       General transfer comment: needed extra assist due to no prosthesis on r BKA  Ambulation/Gait                Stairs            Wheelchair Mobility    Modified Rankin (Stroke Patients Only)       Balance Overall balance assessment: Needs assistance   Sitting balance-Leahy Scale: Good       Standing  balance-Leahy Scale: Poor                               Pertinent Vitals/Pain Pain Assessment: Faces Faces Pain Scale: Hurts little more Pain Location: L LE incisions Pain Descriptors / Indicators: Grimacing;Sore Pain Intervention(s): Limited activity within patient's tolerance;Monitored during session    Home Living Family/patient expects to be discharged to:: Private residence Living Arrangements: Other relatives;Other (Comment) (but pt states he is not going back) Available Help at Discharge: Other (Comment) (pt not sure at this point)             Additional Comments: pt's nieces home information in chart, but pt states he will not return and doesn't have a confirmed plan.    Prior Function Level of Independence: Independent with assistive device(s)         Comments: pt reported that he previously ambulated with [facility] and was independent with ADLs. Pt stated that his niece drives him to doctor's appointments.     Hand Dominance        Extremity/Trunk Assessment   Upper Extremity Assessment Upper Extremity Assessment: Defer to OT evaluation;Overall WFL for tasks assessed    Lower Extremity Assessment Lower Extremity Assessment: LLE deficits/detail;RLE deficits/detail RLE Deficits / Details: grossly 4/5 LLE Deficits / Details: grossly 4-/5 gross extension LLE: Unable to  fully assess due to pain       Communication   Communication: No difficulties  Cognition Arousal/Alertness: Awake/alert Behavior During Therapy: WFL for tasks assessed/performed;Flat affect Overall Cognitive Status: Within Functional Limits for tasks assessed                                        General Comments      Exercises     Assessment/Plan    PT Assessment Patient needs continued PT services  PT Problem List Decreased strength;Decreased activity tolerance;Decreased balance;Decreased mobility;Decreased knowledge of use of DME;Pain       PT  Treatment Interventions Gait training;DME instruction;Functional mobility training;Therapeutic activities;Balance training;Patient/family education    PT Goals (Current goals can be found in the Care Plan section)  Acute Rehab PT Goals Patient Stated Goal: home to an apartment of my own PT Goal Formulation: With patient Time For Goal Achievement: 10/15/17 Potential to Achieve Goals: Good    Frequency Min 3X/week   Barriers to discharge Other (comment) (doesn't have a d/c plan set per pt)      Co-evaluation               AM-PAC PT "6 Clicks" Daily Activity  Outcome Measure Difficulty turning over in bed (including adjusting bedclothes, sheets and blankets)?: A Little Difficulty moving from lying on back to sitting on the side of the bed? : A Little Difficulty sitting down on and standing up from a chair with arms (e.g., wheelchair, bedside commode, etc,.)?: Unable Help needed moving to and from a bed to chair (including a wheelchair)?: A Lot Help needed walking in hospital room?: A Lot Help needed climbing 3-5 steps with a railing? : A Lot 6 Click Score: 13    End of Session   Activity Tolerance: Patient tolerated treatment well;Patient limited by pain;Other (comment) (limited by abscence of prosthesis) Patient left: in chair;with call bell/phone within reach Nurse Communication: Mobility status PT Visit Diagnosis: Other abnormalities of gait and mobility (R26.89);Difficulty in walking, not elsewhere classified (R26.2);Pain Pain - Right/Left: Left Pain - part of body: Leg    Time: 6294-7654 PT Time Calculation (min) (ACUTE ONLY): 33 min   Charges:   PT Evaluation $PT Eval Moderate Complexity: 1 Mod PT Treatments $Therapeutic Activity: 8-22 mins   PT G Codes:        2017/10/18  [name], PT [phone]  (pager)  [name] Kaile Bixler 2017-10-18, 4:51 PM

## 2017-10-01 NOTE — Progress Notes (Signed)
Pt arrived to 4E26 from PACU. Pt AOx4, somewhat drowsy. VSS. Denies any pain. Unable to doppler L DP pulse, can doppler PT and popliteal pulses, PA/MD aware. Updated pt on plan of care. Oriented to room and call light. Will continue to monitor.  Jaymes Graff, RN

## 2017-10-01 NOTE — Anesthesia Postprocedure Evaluation (Signed)
Anesthesia Post Note  Patient: [name]  Procedure(s) Performed: LEFT LEG ANGIOGRAM,  THROMBECTOMY, FEM-POPLITEAL BYPASS GRAFT, tHROMBECTOMY PERONEAL ARTERY AND POSTERIOR TIBIAL , ENDARTERECTOMY TIBIAL/PERONEAL TRUNK WITH BOVINE PATCH ANGIOPLASTY. (Left Leg Lower)     Patient location during evaluation: PACU Anesthesia Type: General Level of consciousness: awake and alert Pain management: pain level controlled Vital Signs Assessment: post-procedure vital signs reviewed and stable Respiratory status: spontaneous breathing, nonlabored ventilation, respiratory function stable and patient connected to nasal cannula oxygen Cardiovascular status: blood pressure returned to baseline and stable Postop Assessment: no apparent nausea or vomiting Anesthetic complications: no    Last Vitals:  Vitals:   10/01/17 0200 10/01/17 0229  BP:  (!) 96/59  Pulse:  74  Resp:  18  Temp: 36.5 C 36.8 C  SpO2:  97%    Last Pain:  Vitals:   10/01/17 0303  TempSrc:   PainSc: 0-No pain                 Tameem Pullara DAVID

## 2017-10-01 NOTE — Transfer of Care (Signed)
Immediate Anesthesia Transfer of Care Note  Patient: Jordan Arellano  Procedure(s) Performed: LEFT LEG ANGIOGRAM,  THROMBECTOMY, FEM-POPLITEAL BYPASS GRAFT, tHROMBECTOMY PERONEAL ARTERY AND POSTERIOR TIBIAL , ENDARTERECTOMY TIBIAL/PERONEAL TRUNK WITH BOVINE PATCH ANGIOPLASTY. (Left Leg Lower)  Patient Location: PACU  Anesthesia Type:General  Level of Consciousness: sedated, drowsy, patient cooperative and responds to stimulation  Airway & Oxygen Therapy: Patient Spontanous Breathing and Patient connected to nasal cannula oxygen  Post-op Assessment: Report given to RN, Post -op Vital signs reviewed and stable and Patient moving all extremities X 4  Post vital signs: Reviewed and stable  Last Vitals:  Vitals:   09/30/17 1830 09/30/17 1845  BP: 122/75 132/76  Pulse: 69 (!) 54  Resp: 15 12  Temp:    SpO2: 99% 100%    Last Pain:  Vitals:   09/30/17 1731  TempSrc:   PainSc: 10-Worst pain ever         Complications: No apparent anesthesia complications

## 2017-10-01 NOTE — Progress Notes (Signed)
ANTICOAGULATION CONSULT NOTE - Follow Up Consult  Pharmacy Consult for Heparin  Indication: Left leg ischemia, s/p thrombectomy in OR 10/26  No Known Allergies Patient Measurements: Height: 6\' 1"  (185.4 cm) Weight: 157 lb 10.1 oz (71.5 kg) IBW/kg (Calculated) : 79.9  Vital Signs: Temp: 98.3 F (36.8 C) (10/27 1200) Temp Source: Oral (10/27 1200) BP: 97/68 (10/27 1200) Pulse Rate: 84 (10/27 1200)  Labs:  Recent Labs  09/30/17 1830 09/30/17 1836 10/01/17 0043 10/01/17 1410  HGB 14.2 13.9 9.5*  --   HCT 42.6 41.0 28.0*  --   PLT 219  --   --   --   LABPROT 13.1  --   --   --   INR 1.00  --   --   --   HEPARINUNFRC  --   --   --  0.19*  CREATININE 1.31* 1.20  --   --     Estimated Creatinine Clearance: 62.1 mL/min (by C-G formula based on SCr of 1.2 mg/dL).  Medical History: Past Medical History:  Diagnosis Date  . Depression   . Hyperlipidemia   . Hypertension   . PAD (peripheral artery disease) ([place]) ~2007   s/p R BKA for non-healing wound  . Stroke ([place]) 03/2014   MRI: Acute nonhemorrhagic left paracentral pontine infarct. 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  Marland Kitchen TIA (transient ischemic attack) 01/2014   Assessment: 66 y/o M with left leg ischemia and thrombosed left fem-pop bypass now s/p OR with thrombectomy. Currently on IV heparin at 1200 units/hr. Initial HL is sub-therapeutic at 0.19. RN reports no s/s of bleeding. No issues with line.   Goal of Therapy:  Heparin level 0.3-0.7 units/ml Monitor platelets by anticoagulation protocol: Yes   Plan:  -Increase IV heparin to 1400 -2300 HL  -Daily CBC/HL -Monitor for bleeding/trend Hgb  Albertina Parr, PharmD., BCPS Clinical Pharmacist Pager [phone]

## 2017-10-02 LAB — BASIC METABOLIC PANEL
Anion gap: 8 (ref 5–15)
BUN: 17 mg/dL (ref 6–20)
CHLORIDE: 104 mmol/L (ref 101–111)
CO2: 20 mmol/L — AB (ref 22–32)
CREATININE: 1.16 mg/dL (ref 0.61–1.24)
Calcium: 7.6 mg/dL — ABNORMAL LOW (ref 8.9–10.3)
GFR calc Af Amer: >60 mL/min (ref >60)
GFR calc non Af Amer: >60 mL/min (ref >60)
Glucose, Bld: 104 mg/dL — ABNORMAL HIGH (ref 65–99)
Potassium: 3.5 mmol/L (ref 3.5–5.1)
Sodium: 132 mmol/L — ABNORMAL LOW (ref 135–145)

## 2017-10-02 LAB — GLUCOSE, CAPILLARY: Glucose-Capillary: 109 mg/dL — ABNORMAL HIGH (ref 65–99)

## 2017-10-02 LAB — CBC
HEMATOCRIT: 29.2 % — AB (ref 39.0–52.0)
Hemoglobin: 9.4 g/dL — ABNORMAL LOW (ref 13.0–17.0)
MCH: 29.7 pg (ref 26.0–34.0)
MCHC: 32.2 g/dL (ref 30.0–36.0)
MCV: 92.4 fL (ref 78.0–100.0)
PLATELETS: 165 10*3/uL (ref 150–400)
RBC: 3.16 MIL/uL — ABNORMAL LOW (ref 4.22–5.81)
RDW: 15.2 % (ref 11.5–15.5)
WBC: 13.1 10*3/uL — ABNORMAL HIGH (ref 4.0–10.5)

## 2017-10-02 LAB — HEPARIN LEVEL (UNFRACTIONATED)
Heparin Unfractionated: 0.42 IU/mL (ref 0.30–0.70)
Heparin Unfractionated: 0.36 IU/mL (ref 0.30–0.70)

## 2017-10-02 MED ORDER — SODIUM CHLORIDE 0.9 % IV BOLUS (SEPSIS)
500.0000 mL | Freq: Once | INTRAVENOUS | Status: AC
  Administered 2017-10-02: 500 mL via INTRAVENOUS

## 2017-10-02 NOTE — Progress Notes (Addendum)
ANTICOAGULATION CONSULT NOTE   Pharmacy Consult for Heparin  Indication: PVD  No Known Allergies Patient Measurements: Height: 6\' 1"  (185.4 cm) Weight: 157 lb 10.1 oz (71.5 kg) IBW/kg (Calculated) : 79.9  Vital Signs: Temp: 97.9 F (36.6 C) (10/28 0837) Temp Source: Oral (10/28 1221) BP: 115/67 (10/28 1303) Pulse Rate: 89 (10/28 1303)  Labs:  Recent Labs  09/30/17 1830 09/30/17 1836 10/01/17 0043 10/01/17 1410 10/01/17 2304 10/02/17 0943  HGB 14.2 13.9 9.5*  --  9.5* 9.4*  HCT 42.6 41.0 28.0*  --  29.1* 29.2*  PLT 219  --   --   --  170 165  LABPROT 13.1  --   --   --   --   --   INR 1.00  --   --   --   --   --   HEPARINUNFRC  --   --   --  0.19* 0.24* 0.42  CREATININE 1.31* 1.20  --   --  1.33* 1.16    Estimated Creatinine Clearance: 64.2 mL/min (by C-G formula based on SCr of 1.16 mg/dL).  Assessment: 66 y/o M with left leg ischemia and thrombosed left fem-pop bypass  s/p thrombectomy for heparin. Repeat HL this PM remains therapeutic but on lower end of goal range. H/H low stable. Plt wnl   Goal of Therapy:  Heparin level 0.3-0.7 units/ml Monitor platelets by anticoagulation protocol: Yes   Plan:  Increase heparin to 1650 units/hr Monitor daily HL, CBC and s/s of bleeding    Albertina Parr, PharmD., BCPS Clinical Pharmacist Pager [phone]

## 2017-10-02 NOTE — Progress Notes (Addendum)
[facility]  Subjective  - He states his left foot is feeling better.   Objective (!) 86/49 91 98.8 F (37.1 C) (Oral) 18 92%  Intake/Output Summary (Last 24 hours) at 10/02/17 0750 Last data filed at 10/02/17 0543  Gross per 24 hour  Intake          3217.83 ml  Output              710 ml  Net          2507.83 ml    Left PT doppler, skin color improving Incisions healing well Heart RRR Lungs non labored  Assessment/Planning: POD # 2  PROCEDURE: 1. Left common femoral artery cannulation under Sonosite guidance 2. Left leg runoff via left femoral sheath 3. Thrombectomy of femoropopliteal bypass 4. Thrombectomy external iliac artery, common femoral artery, popliteal artery, tibioperoneal trunk and anterior tibial artery  5. Endarterectomy below-the-knee popliteal artery with bovine patch angioplasty 6. Limited endarterectomy of proximal tibioperoneal trunk and anterior tibial artery  Full dose heparin Will discuss discharge plan with DR. Bridgett Larsson PT recommendations: Home health PT;Supervision/Assistance - 24 hour (SNF if no d/c plan with 24 hour assist)  Theda Sers, EMMA [facility] 10/02/2017 7:50 AM   Addendum  I have independently interviewed and examined the patient, and I agree with the physician assistant's findings. Pt feels better even though I suspect fem-pop has reoccluded due to poor tibial runoff.  Pt's acute limb ischemia was likely due to occlusion of profunda femoral artery   - SNF placement - Xarelto vs Coumadin (?will medicare pay for Xarelto) for 3 months   Adele Barthel, MD, FACS [facility] [place]: [phone] Pager: [phone]  10/02/2017, 9:15 AM   --  Laboratory Lab Results:  Recent Labs  09/30/17 1830  10/01/17 0043 10/01/17 2304  WBC 15.3*  --   --  17.8*  HGB 14.2  < > 9.5* 9.5*  HCT 42.6  < > 28.0* 29.1*  PLT 219  --   --  170  < > = values in this interval not  displayed. BMET  Recent Labs  09/30/17 1830 09/30/17 1836 10/01/17 0043 10/01/17 2304  NA 138 140 142 137  K 4.4 4.4 4.0 3.7  CL 105 105  --  106  CO2 22  --   --  22  GLUCOSE 157* 160* 154* 98  BUN 19 22*  --  18  CREATININE 1.31* 1.20  --  1.33*  CALCIUM 8.8*  --   --  7.9*    COAG Lab Results  Component Value Date   INR 1.00 09/30/2017   INR 1.08 12/14/2016   INR 1.01 03/25/2014   No results found for: PTT

## 2017-10-02 NOTE — Progress Notes (Signed)
Pt has a low BP , Dr. Maudie Mercury MD notified new order received.

## 2017-10-03 ENCOUNTER — Encounter (HOSPITAL_COMMUNITY): Payer: Self-pay | Admitting: Vascular Surgery

## 2017-10-03 ENCOUNTER — Inpatient Hospital Stay (HOSPITAL_COMMUNITY): Payer: Medicare HMO

## 2017-10-03 DIAGNOSIS — I739 Peripheral vascular disease, unspecified: Secondary | ICD-10-CM

## 2017-10-03 DIAGNOSIS — Z9889 Other specified postprocedural states: Secondary | ICD-10-CM

## 2017-10-03 LAB — CBC
HEMATOCRIT: 26.5 % — AB (ref 39.0–52.0)
HEMOGLOBIN: 8.6 g/dL — AB (ref 13.0–17.0)
MCH: 30.1 pg (ref 26.0–34.0)
MCHC: 32.5 g/dL (ref 30.0–36.0)
MCV: 92.7 fL (ref 78.0–100.0)
Platelets: 147 10*3/uL — ABNORMAL LOW (ref 150–400)
RBC: 2.86 MIL/uL — AB (ref 4.22–5.81)
RDW: 15.4 % (ref 11.5–15.5)
WBC: 10 10*3/uL (ref 4.0–10.5)

## 2017-10-03 LAB — GLUCOSE, CAPILLARY
GLUCOSE-CAPILLARY: 124 mg/dL — AB (ref 65–99)
Glucose-Capillary: 108 mg/dL — ABNORMAL HIGH (ref 65–99)
Glucose-Capillary: 156 mg/dL — ABNORMAL HIGH (ref 65–99)

## 2017-10-03 LAB — HEPARIN LEVEL (UNFRACTIONATED): HEPARIN UNFRACTIONATED: 0.3 [IU]/mL (ref 0.30–0.70)

## 2017-10-03 MED ORDER — RIVAROXABAN 15 MG PO TABS: 15.0000 mg | ORAL_TABLET | Freq: Two times a day (BID) | ORAL | Status: DC

## 2017-10-03 MED ORDER — RIVAROXABAN 20 MG PO TABS: 20.0000 mg | ORAL_TABLET | Freq: Every day | ORAL | Status: DC

## 2017-10-03 MED ORDER — RIVAROXABAN 15 MG PO TABS
15.0000 mg | ORAL_TABLET | Freq: Two times a day (BID) | ORAL | Status: DC
  Administered 2017-10-04: 15 mg via ORAL
  Filled 2017-10-03: qty 1

## 2017-10-03 MED ORDER — SODIUM CHLORIDE 0.9 % IV BOLUS (SEPSIS)
250.0000 mL | Freq: Once | INTRAVENOUS | Status: AC
  Administered 2017-10-03: 250 mL via INTRAVENOUS

## 2017-10-03 NOTE — Progress Notes (Signed)
CM Referral to check insurance coverage for Xarelto:   4. S/W [facility]CTOR[facility] @ [facility] # [phone]   1. XARELTO 15 MG BID  COVER- YES  CO-PAY- $ 8.35  TIER- 3 DRUG  PRIOR APPROVAL-NO    2. XARELTO 20 MG DAILY  COVER- YES  CO-PAY- $ 8.35  TIER- 3 DRUG  PRIOR APPROVAL- NO   PREFERRED PHARMACY : [facility]    Can also provide pt with 30 day trial card for Xarelto upon discharge.    Reinaldo Raddle, RN, BSN  Trauma/Neuro ICU Case Manager [phone]

## 2017-10-03 NOTE — Progress Notes (Signed)
[place] for Xarelto Indication: PVD, possible left EIA embolism  No Known Allergies Patient Measurements: Height: 6\' 1"  (185.4 cm) Weight: 157 lb 10.1 oz (71.5 kg) IBW/kg (Calculated) : 79.9  Vital Signs: Temp: 98 F (36.7 C) (10/29 1608) Temp Source: Oral (10/29 1608) BP: 121/65 (10/29 1608) Pulse Rate: 73 (10/29 1608)  Labs:  Recent Labs  09/30/17 1830 09/30/17 1836  10/01/17 2304 10/02/17 0943 10/02/17 1639 10/03/17 0332  HGB 14.2 13.9  < > 9.5* 9.4*  --  8.6*  HCT 42.6 41.0  < > 29.1* 29.2*  --  26.5*  PLT 219  --   --  170 165  --  147*  LABPROT 13.1  --   --   --   --   --   --   INR 1.00  --   --   --   --   --   --   HEPARINUNFRC  --   --   < > 0.24* 0.42 0.36 0.30  CREATININE 1.31* 1.20  --  1.33* 1.16  --   --   < > = values in this interval not displayed.  Estimated Creatinine Clearance: 64.2 mL/min (by C-G formula based on SCr of 1.16 mg/dL).  Assessment: 66 y/o M with left leg ischemia and thrombosed left fem-pop bypass  s/p thrombectomy on IV heparin. Pharmacy consulted to transition to [place]. No bleeding noted, Hgb has trended down to 8.6, platelets down to 147 post-procedure.  Goal of Therapy:  Therapeutic anticoagulation Monitor platelets by anticoagulation protocol: Yes   Plan:  Discontinue heparin drip Xarelto 15 mg PO bid with meals for 21 days (thru 11/19) then 20 mg PO daily with supper Plan is for 3 months treatment  Monitor for s/sx of bleeding   Renold Genta, PharmD, BCPS Clinical Pharmacist Phone for today - Branch - [phone] 10/03/2017 5:59 PM

## 2017-10-03 NOTE — Clinical Social Work Note (Signed)
Clinical Social Work Assessment  Patient Details  Name: Jordan Arellano MRN: [id number] Date of Birth: 04/02/1951  Date of referral:  10/03/17               Reason for consult:  Facility Placement, Discharge Planning                Permission sought to share information with:  Facility Contact Representative, Family Supports Permission granted to share information::  Yes, Verbal Permission Granted  Name::     Jordan Arellano  Agency::     Relationship::  sister  Contact Information:  [phone]  Housing/Transportation Living arrangements for the past 2 months:  Single Family Home Source of Information:  Patient Patient Interpreter Needed:  None Criminal Activity/Legal Involvement Pertinent to Current Situation/Hospitalization:  No - Comment as needed Significant Relationships:  Siblings, Other Family Members Lives with:  Other (Comment) (neice) Do you feel safe going back to the place where you live?  Yes Need for family participation in patient care:  Yes (Comment)  Care giving concerns: Patient from home with niece. PT recommends 24 hour supervision.   Social Worker assessment / plan: CSW met with patient at bedside. No family at bedside. Patient reports he lives with his niece and she can provide 24 hour care. Patient stated he does not know his niece's phone number. CSW called patient's sister, listed in chart, but number does not work. Patient prefers to go home and not SNF. CSW referred to RNCM- HH with 24 hr supervision recommended vs SNF. CSW can support with disposition planning if no family able to provide 24 hr care.  Employment status:  Retired Insurance information:  Managed Medicare ([employer]) PT Recommendations:  Home with Home Health, 24 Hour Supervision Information / Referral to community resources:  Other (Comment Required)  Patient/Family's Response to care:  Patient appreciative of care.  Patient/Family's Understanding of and Emotional Response to Diagnosis,  Current Treatment, and Prognosis: Patient did not discuss emotional response to treatment. Patient prefers home with home health over SNF.  Emotional Assessment Appearance:  Appears stated age Attitude/Demeanor/Rapport:  Other (appropriate) Affect (typically observed):  Calm Orientation:  Oriented to Self, Oriented to Place, Oriented to Situation, Oriented to  Time Alcohol / Substance use:  Not Applicable Psych involvement (Current and /or in the community):  No (Comment)  Discharge Needs  Concerns to be addressed:  Discharge Planning Concerns Readmission within the last 30 days:  No Current discharge risk:  Physical Impairment Barriers to Discharge:  Continued Medical Work up    , LCSW 10/03/2017, 2:39 PM  

## 2017-10-03 NOTE — Progress Notes (Signed)
ANTICOAGULATION CONSULT NOTE   Pharmacy Consult for Heparin  Indication: PVD  No Known Allergies Patient Measurements: Height: 6\' 1"  (185.4 cm) Weight: 157 lb 10.1 oz (71.5 kg) IBW/kg (Calculated) : 79.9  Vital Signs: Temp: 98.2 F (36.8 C) (10/29 0622) Temp Source: Oral (10/29 0622) BP: 110/58 (10/29 0622)  Labs:  Recent Labs  09/30/17 1830 09/30/17 1836  10/01/17 2304 10/02/17 0943 10/02/17 1639 10/03/17 0332  HGB 14.2 13.9  < > 9.5* 9.4*  --  8.6*  HCT 42.6 41.0  < > 29.1* 29.2*  --  26.5*  PLT 219  --   --  170 165  --  147*  LABPROT 13.1  --   --   --   --   --   --   INR 1.00  --   --   --   --   --   --   HEPARINUNFRC  --   --   < > 0.24* 0.42 0.36 0.30  CREATININE 1.31* 1.20  --  1.33* 1.16  --   --   < > = values in this interval not displayed.  Estimated Creatinine Clearance: 64.2 mL/min (by C-G formula based on SCr of 1.16 mg/dL).  Assessment: 66 y/o M with left leg ischemia and thrombosed left fem-pop bypass  s/p thrombectomy for heparin. Heparin levels have been on lower end of goal range recently on heparin 1650 units/hr. Repeat HL this morning came back within goal range, on lower range, at 0.3. No interruptions to the infusion per nursing. No signs/symptoms of bleeding noted.  this PM remains therapeutic but on lower end of goal range. H/H remain low post-procedure, at 8.6 today. Platelets continue to trend down slightly, at 147 today.   Goal of Therapy:  Heparin level 0.3-0.7 units/ml Monitor platelets by anticoagulation protocol: Yes   Plan:  Increase heparin to 1700 units/hr to keep within goal range Monitor daily HL, CBC and s/s of bleeding Follow-up on plan for outpatient anticoagulation    Doylene Canard, PharmD Clinical Pharmacist  Pager: [phone] Clinical Phone for 10/03/2017 until 3:30pm: [phone] If after 3:30pm, please call main pharmacy at [phone]

## 2017-10-03 NOTE — Progress Notes (Signed)
Physical Therapy Treatment Patient Details Name: Jordan Arellano MRN: [id number] DOB: April 02, 1951 Today's Date: 10/03/2017    History of Present Illness Pt is a 66 y/o male with pmh significant for Stroke, PAD, HTN, R BKA and L LE BPGing, admitted with sudden onset of L LE pain and numbness, s/p L common fem art cannulation, thrombectomy of fem-pop BPG and multiple important arteries, endarterectomy of BK pop art.    PT Comments    Patient tolerated session well and able to ambulate 22ft with min A +2 and RW. Pt required min A for functional transfers. Pt still without R prosthesis. Pt will need RW if d/cing home (pt reported he may go to his brother's house, but vague about d/c plan). Continue to progress as tolerated.    Follow Up Recommendations  Home health PT;Supervision/Assistance - 24 hour (SNF if no d/c plan with 24 hour assist)     Equipment Recommendations  Rolling walker with 5" wheels    Recommendations for Other Services       Precautions / Restrictions Precautions Precautions: Fall Restrictions Weight Bearing Restrictions: No    Mobility  Bed Mobility Overal bed mobility: Needs Assistance Bed Mobility: Sit to Supine       Sit to supine: Supervision   General bed mobility comments: supervision for safety; pt sitting EOB upon arrival  Transfers Overall transfer level: Needs assistance Equipment used: Rolling walker (2 wheeled);None Transfers: Sit to/from [employer] to Stand: Min assist Stand pivot transfers: Min assist      Lateral/Scoot Transfers:  (no AD) General transfer comment: cues for safe hand placement; assist to power up into standing and for balance and RW management for stand pivot; no unsteadiness with backwards steps  Ambulation/Gait Ambulation/Gait assistance: Min assist;+2 safety/equipment Ambulation Distance (Feet): 7 Feet Assistive device: Rolling walker (2 wheeled) Gait Pattern/deviations: Step-to pattern      General Gait Details: cues for posture, sequencing, and proximity of RW   Stairs            Wheelchair Mobility    Modified Rankin (Stroke Patients Only)       Balance Overall balance assessment: Needs assistance Sitting-balance support: No upper extremity supported;Feet supported Sitting balance-Leahy Scale: Good     Standing balance support: Bilateral upper extremity supported;During functional activity;Single extremity supported Standing balance-Leahy Scale: Poor Standing balance comment: Relies on external assistance or UE support to maintain balance in standing.                             Cognition Arousal/Alertness: Awake/alert Behavior During Therapy: WFL for tasks assessed/performed;Flat affect Overall Cognitive Status: No family/caregiver present to determine baseline cognitive functioning                                 General Comments: Pt able to follow commands. Difficulty with higher level reasoning tasks. Will continue to assess.       Exercises General Exercises - Lower Extremity Ankle Circles/Pumps: AROM;Left;10 reps    General Comments General comments (skin integrity, edema, etc.): pt educated on LE therex and encouraged to work on 3X day      Pertinent Vitals/Pain Pain Assessment: Faces Faces Pain Scale: Hurts a little bit Pain Location: L LE incisions Pain Descriptors / Indicators: Sore Pain Intervention(s): Monitored during session;Repositioned    Home Living Family/patient expects to be discharged to:: Private residence Living  Arrangements:  (niece) Available Help at Discharge:  (pt unable to confirm 24 hour assistance) Type of Home: House Home Access: Stairs to enter Entrance Stairs-Rails: Can reach both   Home Equipment: Shower seat;Toilet riser Additional Comments: pt's nieces home information in chart (pt provided information above) but pt states he will not return and doesn't have a confirmed plan.     Prior Function Level of Independence: Independent with assistive device(s)      Comments: Pt vague with majority of responses. Pt previously ambulating with SPC and prosthetic and was independent with ADL. Pt reports that he does not do grocery shopping.   PT Goals (current goals can now be found in the care plan section) Acute Rehab PT Goals Patient Stated Goal: home to an apartment of my own PT Goal Formulation: With patient Time For Goal Achievement: 10/15/17 Potential to Achieve Goals: Good Progress towards PT goals: Progressing toward goals    Frequency    Min 3X/week      PT Plan Current plan remains appropriate    Co-evaluation              AM-PAC PT "6 Clicks" Daily Activity  Outcome Measure  Difficulty turning over in bed (including adjusting bedclothes, sheets and blankets)?: A Little Difficulty moving from lying on back to sitting on the side of the bed? : A Little Difficulty sitting down on and standing up from a chair with arms (e.g., wheelchair, bedside commode, etc,.)?: Unable Help needed moving to and from a bed to chair (including a wheelchair)?: A Little Help needed walking in hospital room?: A Lot Help needed climbing 3-5 steps with a railing? : A Lot 6 Click Score: 14    End of Session Equipment Utilized During Treatment: Gait belt Activity Tolerance: Patient tolerated treatment well Patient left: with call bell/phone within reach;in bed Nurse Communication: Mobility status PT Visit Diagnosis: Other abnormalities of gait and mobility (R26.89);Difficulty in walking, not elsewhere classified (R26.2);Pain Pain - Right/Left: Left Pain - part of body: Leg     Time: 1007-1030 PT Time Calculation (min) (ACUTE ONLY): 23 min  Charges:  $Gait Training: 8-22 mins $Therapeutic Activity: 8-22 mins                    G Codes:       Earney Navy, PTA Pager: [phone]     Darliss Cheney 10/03/2017, 11:12 AM

## 2017-10-03 NOTE — Progress Notes (Signed)
VASCULAR LAB PRELIMINARY  PRELIMINARY  PRELIMINARY  PRELIMINARY  ABIs completed completed.    Preliminary report may be found under results review.   Kaydi Kley, RVT 10/03/2017, 3:57 PM

## 2017-10-03 NOTE — Progress Notes (Signed)
Pt has a drop in BP in the early morning, Dr. Nevada Crane notified , new order received.   BP improved slightly after the bolus of 250 ml NS.

## 2017-10-03 NOTE — Progress Notes (Addendum)
[facility] [place]  Subjective  - Shooting left foot pain started this am.   Objective (!) 110/58 83 98.2 F (36.8 C) (Oral) 18 93%  Intake/Output Summary (Last 24 hours) at 10/03/17 0735 Last data filed at 10/03/17 0600  Gross per 24 hour  Intake          2251.61 ml  Output             2000 ml  Net           251.61 ml    Doppler peroneal/popliteal left, active range of motion intact Incisions healing well Heart RRR Lungs non labored breathing   Assessment/Planning: POD # 3  1. Left common femoral artery cannulation under Sonosite guidance 2. Left leg runoff via left femoral sheath 3. Thrombectomy of femoropopliteal bypass 4. Thrombectomy external iliac artery, common femoral artery, popliteal artery, tibioperoneal trunk and anterior tibial artery  5. Endarterectomy below-the-knee popliteal artery with bovine patch angioplasty 6. Limited endarterectomy of proximal tibioperoneal trunk and anterior tibial artery  On heparin currently Pending insurance Xarelto verses coumadin for 3 months  Laurence [facility]ate [facility] 10/03/2017 7:35 AM   Addendum  I have independently interviewed and examined the patient, and I agree with the physician assistant's findings.  Ok to proceed with Xarelto.  SNF vs home.  From my viewpoint can be discharged tomorrow.  ABI noted.  ABI almost double his baseline.  I suspect he has grown some collaterals in the intervening period.  I suspect the fem-pop bypass is already occluded based on the ABI.  - Pt will follow up with me for his immediate post-op care and then continue longitudinal care with Dr. Donnetta Hutching.   Adele Barthel, MD, FACS [facility] [place] Office: [phone] Pager: [phone]  10/03/2017, 6:56 PM   --  Laboratory Lab Results:  Recent Labs  10/02/17 0943 10/03/17 0332  WBC 13.1* 10.0  HGB 9.4* 8.6*  HCT 29.2* 26.5*  PLT 165 147*   BMET  Recent Labs  10/01/17 2304  10/02/17 0943  NA 137 132*  K 3.7 3.5  CL 106 104  CO2 22 20*  GLUCOSE 98 104*  BUN 18 17  CREATININE 1.33* 1.16  CALCIUM 7.9* 7.6*    COAG Lab Results  Component Value Date   INR 1.00 09/30/2017   INR 1.08 12/14/2016   INR 1.01 03/25/2014   No results found for: PTT

## 2017-10-03 NOTE — Evaluation (Signed)
Occupational Therapy Evaluation Patient Details Name: Jordan Arellano MRN: [id number] DOB: 04/06/1951 Today's Date: 10/03/2017    History of Present Illness Pt is a 66 y/o male with pmh significant for Stroke, PAD, HTN, R BKA and L LE BPGing, admitted with sudden onset of L LE pain and numbness, s/p L common fem art cannulation, thrombectomy of fem-pop BPG and multiple important arteries, endarterectomy of BK pop art.   Clinical Impression   PTA, pt reports independence with basic ADL and functional mobility with a cane. He has assistance from family for transportation to appointments and his niece (who he has been living with) does the grocery shopping. Pt unsure of where he plans to go after D/C. He currently requires mod assist for LB ADL without prosthetic, min assist for sit<>stand transfer in preparation for ADL participation, min guard assist for lateral scoot simulated toilet transfer to drop-arm recliner. Will continue to assess functional level once pt has prosthesis available. Pt will benefit from continued OT services while admitted. Recommend 24 hour assistance and home health OT follow-up to improve independence in home environment. However, if pt unable to obtain 24 hour assistance, he will need short term rehabilitation at SNF level.     Follow Up Recommendations  Home health OT;Supervision/Assistance - 24 hour (SNF if unable to obtain 24 hour assistance)    Equipment Recommendations  None recommended by OT    Recommendations for Other Services       Precautions / Restrictions Precautions Precautions: Fall Restrictions Weight Bearing Restrictions: No      Mobility Bed Mobility               General bed mobility comments: Sitting at EOB on OT arrival.   Transfers Overall transfer level: Needs assistance Equipment used: Rolling walker (2 wheeled);None Transfers: Sit to/from Stand;Lateral/Scoot Transfers Sit to Stand: Min assist        Lateral/Scoot  Transfers: Min guard (no AD) General transfer comment: Able to stand from chair with min assist. MIn guard assist for safety with lateral scoot transfer (completed due to no prosthesis available).     Balance Overall balance assessment: Needs assistance Sitting-balance support: No upper extremity supported;Feet supported Sitting balance-Leahy Scale: Good     Standing balance support: Bilateral upper extremity supported;During functional activity;Single extremity supported Standing balance-Leahy Scale: Poor Standing balance comment: Relies on external assistance or UE support to maintain balance in standing.                            ADL either performed or assessed with clinical judgement   ADL Overall ADL's : Needs assistance/impaired Eating/Feeding: Set up;Sitting   Grooming: Set up;Sitting   Upper Body Bathing: Set up;Sitting   Lower Body Bathing: Sit to/from stand;Moderate assistance Lower Body Bathing Details (indicate cue type and reason): Mod assist for balance in standing during dynamic task without R prosthesis.  Upper Body Dressing : Set up;Sitting   Lower Body Dressing: Sit to/from stand;Moderate assistance Lower Body Dressing Details (indicate cue type and reason): Mod assist for balance in standing during dynamic task without R prosthesis.  Toilet Transfer: Technical brewer Details (indicate cue type and reason): Simulated with lateral scoot to drop-arm recliner as pt does not have prosthetic with him. Able to stand with RW and min assist from chair.  Toileting- Clothing Manipulation and Hygiene: Minimal assistance;Sit to/from stand       Functional mobility during ADLs: Minimal assistance;Rolling walker (transfers)  General ADL Comments: Pt reports that family will be bringing in his prosthetic today.      Vision Patient Visual Report: No change from baseline Vision Assessment?: No apparent visual deficits     Perception     Praxis       Pertinent Vitals/Pain Pain Assessment: Faces Faces Pain Scale: Hurts little more Pain Location: L LE incisions Pain Descriptors / Indicators: Shooting Pain Intervention(s): Limited activity within patient's tolerance;Monitored during session;Repositioned     Hand Dominance     Extremity/Trunk Assessment Upper Extremity Assessment Upper Extremity Assessment: Overall WFL for tasks assessed   Lower Extremity Assessment Lower Extremity Assessment: Defer to PT evaluation       Communication Communication Communication: No difficulties   Cognition Arousal/Alertness: Awake/alert Behavior During Therapy: WFL for tasks assessed/performed;Flat affect Overall Cognitive Status: No family/caregiver present to determine baseline cognitive functioning                                 General Comments: Pt able to follow commands. Difficulty with higher level reasoning tasks. Will continue to assess.    General Comments  VSS throughout    Exercises     Shoulder Instructions      Home Living Family/patient expects to be discharged to:: Private residence Living Arrangements:  (niece) Available Help at Discharge:  (pt unable to confirm 24 hour assistance) Type of Home: House Home Access: Stairs to enter [employer] of Steps: 5-6 Entrance Stairs-Rails: Can reach both       Bathroom Shower/Tub: Walk-in shower         Home Equipment: Shower seat;Toilet riser   Additional Comments: pt's nieces home information in chart (pt provided information above) but pt states he will not return and doesn't have a confirmed plan.      Prior Functioning/Environment Level of Independence: Independent with assistive device(s)        Comments: Pt vague with majority of responses. Pt previously ambulating with SPC and prosthetic and was independent with ADL. Pt reports that he does not do grocery shopping.        OT Problem List: Decreased activity  tolerance;Impaired balance (sitting and/or standing);Decreased range of motion;Decreased safety awareness;Decreased knowledge of use of DME or AE;Decreased knowledge of precautions;Pain      OT Treatment/Interventions: Self-care/ADL training;Therapeutic exercise;Energy conservation;DME and/or AE instruction;Therapeutic activities;Patient/family education;Balance training    OT Goals(Current goals can be found in the care plan section) Acute Rehab OT Goals Patient Stated Goal: home to an apartment of my own OT Goal Formulation: With patient Time For Goal Achievement: 10/17/17 Potential to Achieve Goals: Good  OT Frequency: Min 2X/week   Barriers to D/C:            Co-evaluation              AM-PAC PT "6 Clicks" Daily Activity     Outcome Measure Help from another person eating meals?: A Little Help from another person taking care of personal grooming?: A Little Help from another person toileting, which includes using toliet, bedpan, or urinal?: A Lot Help from another person bathing (including washing, rinsing, drying)?: A Lot Help from another person to put on and taking off regular upper body clothing?: A Little Help from another person to put on and taking off regular lower body clothing?: A Lot 6 Click Score: 15   End of Session Equipment Utilized During Treatment: Gait belt;Rolling walker Nurse Communication: Mobility  status  Activity Tolerance: Patient tolerated treatment well Patient left: in chair;with call bell/phone within reach  OT Visit Diagnosis: Other abnormalities of gait and mobility (R26.89);Pain Pain - Right/Left: Left Pain - part of body: Leg                Time: 5329-9242 OT Time Calculation (min): 18 min Charges:  OT General Charges $OT Visit: 1 Visit OT Evaluation $OT Eval Moderate Complexity: 1 Mod G-Codes:     Norman Herrlich, MS OTR/L  Pager: [place] A Lynne Righi 10/03/2017, 9:47 AM

## 2017-10-04 ENCOUNTER — Telehealth: Payer: Self-pay | Admitting: Vascular Surgery

## 2017-10-04 LAB — CBC
HCT: 28 % — ABNORMAL LOW (ref 39.0–52.0)
HEMOGLOBIN: 9 g/dL — AB (ref 13.0–17.0)
MCH: 29.6 pg (ref 26.0–34.0)
MCHC: 32.1 g/dL (ref 30.0–36.0)
MCV: 92.1 fL (ref 78.0–100.0)
Platelets: 161 10*3/uL (ref 150–400)
RBC: 3.04 MIL/uL — AB (ref 4.22–5.81)
RDW: 15.2 % (ref 11.5–15.5)
WBC: 9.5 10*3/uL (ref 4.0–10.5)

## 2017-10-04 LAB — GLUCOSE, CAPILLARY
GLUCOSE-CAPILLARY: 106 mg/dL — AB (ref 65–99)
GLUCOSE-CAPILLARY: 107 mg/dL — AB (ref 65–99)

## 2017-10-04 LAB — HEPARIN LEVEL (UNFRACTIONATED)

## 2017-10-04 MED ORDER — OXYCODONE HCL 5 MG PO TABS: 5.0000 mg | ORAL_TABLET | ORAL | 0 refills | Status: DC | PRN

## 2017-10-04 MED ORDER — RIVAROXABAN 20 MG PO TABS: 20.0000 mg | ORAL_TABLET | Freq: Every day | ORAL | 2 refills | Status: DC

## 2017-10-04 MED ORDER — RIVAROXABAN 15 MG PO TABS: 15.0000 mg | ORAL_TABLET | Freq: Two times a day (BID) | ORAL | 0 refills | Status: DC

## 2017-10-04 MED ORDER — RIVAROXABAN (XARELTO) VTE STARTER PACK (15 & 20 MG): ORAL_TABLET | ORAL | 0 refills | Status: DC

## 2017-10-04 NOTE — Progress Notes (Signed)
Patient awaiting family for discharge. Written instructions given. Iv removed. Will continue to monitor. Muhannad Bignell, Mervin Kung RN

## 2017-10-04 NOTE — Consult Note (Signed)
[facility] CM Primary Care Navigator  10/04/2017  [name] May 23, 1951 [id number]   Met with patientat the bedside to identify possible discharge needs. Patient reports that he had  "severe numbing pain" to his left lower extremity that led tothis admission.  Patient states that Dr. Flossie Buffy with [facility] [place] is theprimary care provider (called office and confirmed, notified of patient's discharge disposition and need for post hospital follow-up and transition of care; made aware to refer patient to [facility] CM if deemed necessary and appropriate for services).   [facility].   Patient states that he manages his medications at home using "pill box" system filled every week.  Patient states that his niece Sharyn Lull) used to provide transportation to his doctors' appointments. His brother Deidre Ala) or son Rogers Ditter) should be able to provide transportation to his doctors' appointments after discharge.   [employer] transportation benefits discussed with patient.  According to patient, his brother and his son would be hisprimary caregivers at home as stated.  Anticipated discharge plan is home per patient. He plans to stay at his brother's house after discharge until he recovers. Therapy recommended for home health services.  Patient voiced understanding to call primary care provider's officewhen he returns backhome,for a post discharge follow-up within a week or sooner if needs arise.Patient letter (with PCP's contact number) was provided as areminder.   Discussed with patientregarding [facility] CM services available for health managementat home. Patient states "managing self so far" without any issues. Patientexpressed understanding to seekreferral from primary care provider to [facility] care management ifdeemed necessary and appropriatefor services in the  future.  [facility] care management information was provided for future needs that patientmay have.  Patient had opted and verbally agreed for [facility] calls to follow-up with hisrecovery at home.   Referral made to [facility] [place] calls after discharge.   Patient's RN aware of his concern that his niece is not able to pick-up him at discharge today. RN is assisting patient in contacting available family members to bring patient home.   For additional questions please contact:  Edwena Felty A. Beatrice Ziehm, BSN, RN-BC [facility] PRIMARY CARE Navigator Cell: [phone]

## 2017-10-04 NOTE — Telephone Encounter (Signed)
-----   Message from Mena Goes, RN sent at 10/04/2017  9:35 AM EDT ----- Regarding: FW: 2-3 weeks with Jordan Arellano postop   ----- Message ----- From: Elam Dutch, MD Sent: 10/04/2017   9:21 AM To: Mena Goes, RN Subject: RE: 2-3 weeks with Jordan Arellano postop                 Not sure why this came to me  Ruta Hinds ----- Message ----- From: Mena Goes, RN Sent: 10/04/2017   8:37 AM To: Elam Dutch, MD Subject: 2-3 weeks with Jordan Arellano postop                       ----- Message ----- From: Ulyses Amor, PA-C Sent: 10/04/2017   7:59 AM To: Vvs Charge Pool  S/P thrombectomy and endarterectomy of popliteal artery left LE f/u with Dr. Bridgett Arellano in 2-3 weeks

## 2017-10-04 NOTE — Care Management Note (Signed)
Case Management Note Marvetta Gibbons RN, BSN Unit 4E-Case Manager [phone]  Patient Details  Name: Luismario Coston MRN: [id number] Date of Birth: 1951/01/26  Subjective/Objective:  Pt admitted s/p Femoral-popliteal bypass                 Action/Plan: PTA pt lived at home no DME or HH needs for discharge pt at baseline for mobility- has been started on Xarelto- benefits check completed- copay cost $8.35- spoke with pt at bedside- coverage info shared- and 30 day free card provided to pt for discharge- instructed pt to use [facility] on [address] to fill starter pack if that is what script is written for.   Expected Discharge Date:  10/04/17               Expected Discharge Plan:  Home/Self Care  In-House Referral:     Discharge planning Services  CM Consult, Medication Assistance  Post Acute Care Choice:  NA Choice offered to:  NA  DME Arranged:  N/A DME Agency:  NA  HH Arranged:  NA HH Agency:  NA  Status of Service:  Completed, signed off  If discussed at [place] of Stay Meetings, dates discussed:    Discharge Disposition: home/self care   Additional Comments:  Dawayne Patricia, RN 10/04/2017, 12:19 PM

## 2017-10-04 NOTE — Telephone Encounter (Signed)
Sched appt 10/21/17 at 11:45. No ph# for the pt or other contacts had a vm. Mailed appt letter through regular mail.

## 2017-10-04 NOTE — Progress Notes (Addendum)
[facility] [place] [facility]  Subjective  - Doing well over all.   Objective (!) 89/56 64 98.5 F (36.9 C) (Oral) 12 95%  Intake/Output Summary (Last 24 hours) at 10/04/17 0742 Last data filed at 10/03/17 2300  Gross per 24 hour  Intake          1364.23 ml  Output             1590 ml  Net          -225.77 ml    Doppler weak signal peroneal/popliteal Active range of motion in toes and ankle Heart RRR Lungs non labored breathing Incision healing well +-------+---------------+----------------+ ABI/TBIToday's ABI/TBIPrevious ABI/TBI +-------+---------------+----------------+ Right BKA               +-------+---------------+----------------+ Left  0.52      0.28       +-------+---------------+----------------+  Assessment/Planning: POD # 3  1. Left common femoral artery cannulation under Sonosite guidance 2. Left leg runoff via left femoral sheath 3. Thrombectomy of femoropopliteal bypass 4. Thrombectomy external iliac artery, common femoral artery, popliteal artery, tibioperoneal trunk and anterior tibial artery  5. Endarterectomy below-the-knee popliteal artery with bovine patch angioplasty 6. Limited endarterectomy of proximal tibioperoneal trunk and anterior tibial artery  Per Dr. Lianne Moris note: ABI almost double his baseline.  I suspect he has grown some collaterals in the intervening period.  I suspect the fem-pop bypass is already occluded based on the ABI.  1. XARELTO 15 MG BID  COVER- YES  CO-PAY- $ 8.35  TIER- 3 DRUG  PRIOR APPROVAL-NO    2. XARELTO 20 MG DAILY  COVER- YES  CO-PAY- $ 8.35  TIER- 3 DRUG  PRIOR APPROVAL- NO   PREFERRED PHARMACY : [facility]    Can also provide pt with 30 day trial card for Xarelto upon discharge.    Pharmacy consult: Plan:  Discontinue heparin drip Xarelto 15 mg PO bid with meals for 21 days (thru 11/19) then 20 mg PO daily with supper Plan is for 3 months  treatment  Monitor for s/sx of bleeding  Patient does not have [place] or equipment needs.  He is at base line for his mobility.   Laurence Slate [facility] 10/04/2017 7:42 AM   Addendum  I have independently interviewed and examined the patient, and I agree with the physician assistant's findings.  Pt can either be discharged home if he has adequate care at home or transferred to SNF as room available.  Adele Barthel, MD, FACS [facility] [place] Office: [phone] Pager: [phone]  10/04/2017, 10:26 AM   --  Laboratory Lab Results:  Recent Labs  10/03/17 0332 10/04/17 0419  WBC 10.0 9.5  HGB 8.6* 9.0*  HCT 26.5* 28.0*  PLT 147* 161   BMET  Recent Labs  10/01/17 2304 10/02/17 0943  NA 137 132*  K 3.7 3.5  CL 106 104  CO2 22 20*  GLUCOSE 98 104*  BUN 18 17  CREATININE 1.33* 1.16  CALCIUM 7.9* 7.6*    COAG Lab Results  Component Value Date   INR 1.00 09/30/2017   INR 1.08 12/14/2016   INR 1.01 03/25/2014   No results found for: PTT

## 2017-10-04 NOTE — Discharge Instructions (Signed)
Information on my medicine - XARELTO (rivaroxaban)  This medication education was reviewed with me or my healthcare representative as part of my discharge preparation.  The pharmacist that spoke with me during my hospital stay was:  Betsey Holiday, [place]? Xarelto was prescribed to treat blood clots that may have been found in the veins of your legs (deep vein thrombosis) or in your lungs (pulmonary embolism) and to reduce the risk of them occurring again.  What do you need to know about Xarelto? The starting dose is one 15 mg tablet taken TWICE daily with food for the FIRST 21 DAYS then the dose is changed to one 20 mg tablet taken ONCE A DAY with your evening meal.  DO NOT stop taking Xarelto without talking to the health care provider who prescribed the medication.  Refill your prescription for 20 mg tablets before you run out.  After discharge, you should have regular check-up appointments with your healthcare provider that is prescribing your Xarelto.  In the future your dose may need to be changed if your kidney function changes by a significant amount.  What do you do if you miss a dose? If you are taking Xarelto TWICE DAILY and you miss a dose, take it as soon as you remember. You may take two 15 mg tablets (total 30 mg) at the same time then resume your regularly scheduled 15 mg twice daily the next day.  If you are taking Xarelto ONCE DAILY and you miss a dose, take it as soon as you remember on the same day then continue your regularly scheduled once daily regimen the next day. Do not take two doses of Xarelto at the same time.   Important Safety Information Xarelto is a blood thinner medicine that can cause bleeding. You should call your healthcare provider right away if you experience any of the following: ? Bleeding from an injury or your nose that does not stop. ? Unusual colored urine (red or dark brown) or unusual colored stools  (red or black). ? Unusual bruising for unknown reasons. ? A serious fall or if you hit your head (even if there is no bleeding).  Some medicines may interact with Xarelto and might increase your risk of bleeding while on Xarelto. To help avoid this, consult your healthcare provider or pharmacist prior to using any new prescription or non-prescription medications, including herbals, vitamins, non-steroidal anti-inflammatory drugs (NSAIDs) and supplements.  This website has more information on Xarelto: [email or web address]

## 2017-10-05 ENCOUNTER — Telehealth: Payer: Self-pay | Admitting: *Deleted

## 2017-10-05 NOTE — Discharge Summary (Signed)
[facility] Discharge Summary   Patient ID:  Tanner Yeley MRN: [id number] DOB/AGE: 1951-04-27 66 y.o.  Admit date: 09/30/2017 Discharge date: 10/04/2017 Date of Surgery: 09/30/2017 - 10/01/2017 Surgeon: Juliann Mule): Conrad Ponemah, MD  Admission Diagnosis: Pre-op chest exam [J82.505] Status post femoral-popliteal bypass surgery [Z95.828]  Discharge Diagnoses:  Pre-op chest exam [L97.673] Status post femoral-popliteal bypass surgery [Z95.828]  Secondary Diagnoses: Past Medical History:  Diagnosis Date  . Depression   . Hyperlipidemia   . Hypertension   . PAD (peripheral artery disease) ([place]) ~2007   s/p R BKA for non-healing wound  . Stroke ([place]) 03/2014   MRI: Acute nonhemorrhagic left paracentral pontine infarct. 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  Marland Kitchen TIA (transient ischemic attack) 01/2014    Procedure(s): LEFT LEG ANGIOGRAM,  THROMBECTOMY, FEM-POPLITEAL BYPASS GRAFT, tHROMBECTOMY PERONEAL ARTERY AND POSTERIOR TIBIAL , ENDARTERECTOMY TIBIAL/PERONEAL TRUNK WITH BOVINE PATCH ANGIOPLASTY.  Discharged Condition: stable  HPI: Konstantine Gervasi is a 66 y.o. (05-31-1951) male who presents with cc: left leg pain and numbness.  Pt is s/p L CFA to BK pop BPG w/ Propaten with Dr. Donnetta Hutching on 12/17/16.  Pt notes acute onset of left leg pain at 4 PM today.  He denies any abnormal heart beats.  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  Pt last ate this morning.     Franke Menter is a 66 y.o. male who presents with:  LLE acute limb ischemia with compromised motor and sensation, s/p Left fem-pop BPG  Hospital Course:  Averi Cacioppo is a 66 y.o. male is S/P Procedure(s): LEFT LEG ANGIOGRAM,  THROMBECTOMY, FEM-POPLITEAL BYPASS GRAFT, tHROMBECTOMY PERONEAL ARTERY AND POSTERIOR TIBIAL , ENDARTERECTOMY TIBIAL/PERONEAL TRUNK WITH BOVINE PATCH  ANGIOPLASTY.  POD# 1 Left LE foot active range of motion intact Popliteal doppler signal, no pedal signals Heart RRR Lungs non labored breathing  Full dose heparin Will d/c foley Encourage mobility  +-------+---------------+----------------+ ABI/TBIToday's ABI/TBIPrevious ABI/TBI +-------+---------------+----------------+ Right BKA               +-------+---------------+----------------+ Left  0.52      0.28       +-------+---------------+----------------+  PT evaluation back to base line of function, Doppler popliteal/peroneal Discharge home in stable condition on Xarelto for 3 months.    Per Dr. Lianne Moris note: ABI almost double his baseline. I suspect he has grown some collaterals in the intervening period. I suspect the fem-pop bypass is already occluded based on the ABI.  F/U with Dr. Bridgett Larsson in 2 weeks.  Consults:  Treatment Team:  Conrad Success, MD Rosetta Posner, MD  Significant Diagnostic Studies: CBC Lab Results  Component Value Date   WBC 9.5 10/04/2017   HGB 9.0 (L) 10/04/2017   HCT 28.0 (L) 10/04/2017   MCV 92.1 10/04/2017   PLT 161 10/04/2017    BMET    Component Value Date/Time   NA 132 (L) 10/02/2017 0943   NA 139 02/03/2016   K 3.5 10/02/2017 0943   CL 104 10/02/2017 0943   CO2 20 (L) 10/02/2017 0943   GLUCOSE 104 (H) 10/02/2017 0943   BUN 17 10/02/2017 0943   BUN 28 (A) 02/03/2016   CREATININE 1.16 10/02/2017 0943   CALCIUM 7.6 (L) 10/02/2017 0943   GFRNONAA >60 10/02/2017 0943   GFRAA >60 10/02/2017 0943   COAG Lab Results  Component Value Date   INR  1.00 09/30/2017   INR 1.08 12/14/2016   INR 1.01 03/25/2014     Disposition:  Discharge to :Home Discharge Instructions    Call MD for:  redness, tenderness, or signs of infection (pain, swelling, bleeding, redness, odor or green/yellow discharge around incision site)    Complete by:  As directed    Call MD for:  severe or increased pain, loss  or decreased feeling  in affected limb(s)    Complete by:  As directed    Call MD for:  temperature >100.5    Complete by:  As directed    Discharge instructions    Complete by:  As directed    Shower daily as needed   Increase activity slowly    Complete by:  As directed    Walk with assistance use walker or cane as needed   Resume previous diet    Complete by:  As directed      Allergies as of 10/04/2017   No Known Allergies     Medication List    TAKE these medications   amLODipine 5 MG tablet Commonly known as:  NORVASC Take 1 tablet (5 mg total) by mouth daily.   aspirin 81 MG tablet Take 81 mg by mouth daily.   atorvastatin 40 MG tablet Commonly known as:  LIPITOR Take 1 tablet (40 mg total) by mouth daily at 6 PM. Pt need to see PCP for more refills. What changed:  additional instructions   blood glucose meter kit and supplies Kit Dispense based on patient and insurance preference. Use two times daily as directed (before breakfast and at bedtime. (FOR ICD-10: E08.42)   fenofibrate 145 MG tablet Commonly known as:  TRICOR Take 1 tablet (145 mg total) by mouth daily.   hydrochlorothiazide 12.5 MG tablet Commonly known as:  HYDRODIURIL Take 1 tablet (12.5 mg total) by mouth daily. Need to see PCP for future refills. What changed:  additional instructions   insulin glargine 100 unit/mL Sopn Commonly known as:  LANTUS Inject 0.15 mLs (15 Units total) into the skin at bedtime.   metFORMIN 500 MG tablet Commonly known as:  GLUCOPHAGE Take 1 tablet (500 mg total) by mouth 2 (two) times daily with a meal.   olmesartan 40 MG tablet Commonly known as:  BENICAR Take 1 tablet (40 mg total) by mouth daily.   oxyCODONE 5 MG immediate release tablet Commonly known as:  Oxy IR/ROXICODONE Take 1 tablet (5 mg total) by mouth every 4 (four) hours as needed for moderate pain.   Pen Needles 31G X 6 MM Misc 1 application by Does not apply route at bedtime.   potassium  chloride 10 MEQ tablet Commonly known as:  K-DUR,KLOR-CON Take 1 tablet (10 mEq total) by mouth daily.   Rivaroxaban 15 MG Tabs tablet Commonly known as:  XARELTO Take 1 tablet (15 mg total) by mouth 2 (two) times daily with a meal.   Rivaroxaban 15 & 20 MG Tbpk Take as directed on package: Start with one 62m tablet by mouth twice a day with food. On Day 22, switch to one 229mtablet once a day with food.   rivaroxaban 20 MG Tabs tablet Commonly known as:  XARELTO Take 1 tablet (20 mg total) by mouth daily with supper. Start taking on:  10/25/2017   sertraline 100 MG tablet Commonly known as:  ZOLOFT Take 1 tablet (100 mg total) by mouth daily.      Verbal and written Discharge instructions given to the  patient. Wound care per Discharge AVS Follow-up Information    Conrad Wheatland, MD Follow up in 2 week(s).   Specialties:  Vascular Surgery, Cardiology Why:  office will call Contact information: [place] [address] [phone]           Signed: Laurence Slate [facility] 10/05/2017, 10:56 AM   Addendum  This patient present with hx consistent with acute L fem-pop bypass occlusion.  He was brought back to the OR for a L fem-pop BPG thrombectomy.  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  The fem-pop bypass was opened along with profunda femoral artery and external iliac artery.  I then had to explore the distal graft due to early reocclusion.  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  Additionally, I did endartrectomy and bovine patch angioplasty of below-the-knee popliteal artery.  I did limited endarterectomy on both tibioperoneal trunk and anterior tibial artery.  This essentially demonstrated that the tibial arteries were the root cause of occlusion of this bypass graft.  I had discussed with the patient  preoperatively that a jump graft from a prosthetic to a tibial artery was unlikely to last, so we would not do such.  The patient post-operative course was unremarkable.  Patient had some rehab needs, and patient was offered SNF placement which he declined, electing at Home PT instead.  The patient will follow up in 2 weeks for wound checks.   Adele Barthel, MD, FACS [facility] [place] Office: [phone] Pager: [phone]  10/05/2017, 1:31 PM

## 2017-10-05 NOTE — Telephone Encounter (Signed)
Pt was on TCM list admitted 09/30/29 for left leg pain and numbness.Pt is s/p L CFA to BK pop BPG w/ Propaten with Dr. Donnetta Hutching on 12/17/16. Pt had testing which lead to him having femoral-popliteal bypass surgery . Pt was D/C 10/04/17, and will f/u w/specilaist Dr. Adele Barthel in 2 weeks C.V.S.../lmb

## 2017-10-15 NOTE — Progress Notes (Deleted)
    Postoperative Visit   History of Present Illness   Jordan Arellano is a 66 y.o. year old male who presents for postoperative follow-up for: TE L fem-pop BPG, TE L EIA, CFA, PFA, pop, TPT, and ATA, EA BTK pop w/ BPA, limited EA of prox TPT and ATA (10/01/17).  This patient has previously undergone L fem-pop with Dr. Donnetta Hutching.  This fem-pop BPG occluded likely due to extensive distal tibial artery disease.  This patient's rest pain was resolved after procedure.  The patient's wounds are *** healed.  The patient notes *** resolution of lower extremity symptoms.  The patient is *** able to complete their activities of daily living.  The patient's current symptoms are: ***.   For VQI Use Only   PRE-ADM LIVING: {VQI Pre-admission Living:20973}  AMB STATUS: {VQI Ambulatory Status:20974}   Physical Examination  ***There were no vitals filed for this visit.  LLE: Incisions are *** healed, wounds are *** healed, pedal pulses are ***   Medical Decision Making   Jordan Arellano is a 66 y.o. year old male who presents s/p TE L fem-pop BPG, TE L EIA, CFA, PFA, pop, TPT, and ATA, EA BTK pop w/ BPA, limited EA of prox TPT and ATA .  *** Pt will continue follow up with Dr. Donnetta Hutching. Thank you for allowing [place] to participate in this patient's care.   Adele Barthel, MD, FACS [facility] [place] Office: [phone] Pager: [phone]

## 2017-10-21 ENCOUNTER — Encounter: Payer: Medicare HMO | Admitting: Vascular Surgery

## 2017-11-04 ENCOUNTER — Other Ambulatory Visit: Payer: Self-pay | Admitting: Nurse Practitioner

## 2017-11-04 DIAGNOSIS — E0842 Diabetes mellitus due to underlying condition with diabetic polyneuropathy: Secondary | ICD-10-CM

## 2017-11-04 DIAGNOSIS — E0865 Diabetes mellitus due to underlying condition with hyperglycemia: Principal | ICD-10-CM

## 2017-11-04 DIAGNOSIS — IMO0002 Reserved for concepts with insufficient information to code with codable children: Secondary | ICD-10-CM

## 2017-11-08 NOTE — Progress Notes (Signed)
Postoperative Visit   History of Present Illness   Jordan Arellano is a 66 y.o. year old male who presents for postoperative wound check.    PROCEDURE (10/01/17): 1. Left common femoral artery cannulation under Sonosite guidance 2. Left leg runoff via left femoral sheath 3. Thrombectomy of left femoropopliteal bypass 4. Thrombectomy left external iliac artery, common femoral artery, profunda femoral artery, popliteal artery, tibioperoneal trunk and anterior tibial artery  5. Endarterectomy below-the-knee popliteal artery with bovine patch angioplasty 6. Limited endarterectomy of proximal tibioperoneal trunk and anterior tibial artery   Intraoperative findings suggest extensive tibial disease as the etiology of the occlusion.  I suspect the etiology of his acute limb sx was PFA occlusion due to the fem-pop BPG thrombosis.    The patient's wounds are  healed.  The patient notes some resolution of lower extremity symptoms, he still has pain in the left foot, but the third toe wound has healed.  .  The patient is  able to complete most activities of daily living.  The patient's current symptoms are: pain and decreased sensation.  No wounds currently.   For VQI Use Only   PRE-ADM LIVING: Home  AMB STATUS: Ambulatory, right BKA prothesis   Physical Examination   Vitals:   11/11/17 1453  BP: 130/79  Pulse: 92  Resp: 20  Temp: (!) 97.2 F (36.2 C)  TempSrc: Oral  SpO2: 99%  Weight: 162 lb (73.5 kg)  Height: 6\' 1"  (1.854 m)    Right LE: Incisions are healed, wounds are  healed, pedal pulses are not palpable, very weak PT doppler from collaterals   Medical Decision Making   Jordan Arellano is a 66 y.o. year old male who presents s/p TE L fem-pop BPG, TE L EIA, CFA, PFA, pop, TPT and ATA, EA with BPA BK pop, Limited EA proxi TPT and AT.   The patient's bypass incisions are healing appropriately with resolution of pre-operative symptoms of non healing right third toe wound. I  discussed in depth with the patient the nature of atherosclerosis, and emphasized the importance of maximal medical management including strict control of blood pressure, blood glucose, and lipid levels, obtaining regular exercise, and cessation of smoking.  The patient is aware that without maximal medical management the underlying atherosclerotic disease process will progress, limiting the benefit of any interventions. The patient's surveillance will included ABI which will be completed in: 3 months, at which time the patient will be re-evaluated.   I emphasized the importance of routine surveillance of the patient's bypass, as the vascular surgery literature emphasize the improved patency possible with assisted primary patency procedures versus secondary patency procedures. The patient agrees to participate in their maximal medical care and routine surveillance. Thank you for allowing [place] to participate in this patient's care.   Adele Barthel, MD, FACS [facility] [place] Office: [phone] Pager: [phone]  The patient was seen in conjunction with Roxy Horseman PA-C  Addendum  I have independently interviewed and examined the patient, and I agree with the physician assistant's findings.  Pt is Dr. Luther Parody patient, so he will follow up with him in 3 months.  I suspect the PFA occlusion due to extension of thrombus from the fem-pop BPG into CFA is the etiology for his acute limb sx.  At this point, most of these sx are resolved.     Adele Barthel, MD, FACS [facility] [place] Office: [phone] Pager: [phone]  11/11/2017, 4:11 PM

## 2017-11-11 ENCOUNTER — Encounter: Payer: Self-pay | Admitting: Vascular Surgery

## 2017-11-11 ENCOUNTER — Ambulatory Visit (INDEPENDENT_AMBULATORY_CARE_PROVIDER_SITE_OTHER): Payer: Self-pay | Admitting: Vascular Surgery

## 2017-11-11 VITALS — BP 130/79 | HR 92 | Temp 97.2°F | Resp 20 | Ht 73.0 in | Wt 162.0 lb

## 2017-11-11 DIAGNOSIS — I739 Peripheral vascular disease, unspecified: Secondary | ICD-10-CM

## 2017-12-07 ENCOUNTER — Telehealth: Payer: Self-pay | Admitting: Nurse Practitioner

## 2017-12-07 DIAGNOSIS — I10 Essential (primary) hypertension: Secondary | ICD-10-CM

## 2017-12-07 DIAGNOSIS — F419 Anxiety disorder, unspecified: Secondary | ICD-10-CM

## 2017-12-07 DIAGNOSIS — F32A Depression, unspecified: Secondary | ICD-10-CM

## 2017-12-07 DIAGNOSIS — F329 Major depressive disorder, single episode, unspecified: Secondary | ICD-10-CM

## 2017-12-07 MED ORDER — POTASSIUM CHLORIDE CRYS ER 10 MEQ PO TBCR: 10.0000 meq | EXTENDED_RELEASE_TABLET | Freq: Every day | ORAL | 3 refills | Status: DC

## 2017-12-07 MED ORDER — AMLODIPINE BESYLATE 5 MG PO TABS: 5.0000 mg | ORAL_TABLET | Freq: Every day | ORAL | 1 refills | Status: DC

## 2017-12-07 MED ORDER — OLMESARTAN MEDOXOMIL 40 MG PO TABS: 40.0000 mg | ORAL_TABLET | Freq: Every day | ORAL | 1 refills | Status: DC

## 2017-12-07 MED ORDER — SERTRALINE HCL 100 MG PO TABS: 100.0000 mg | ORAL_TABLET | Freq: Every day | ORAL | 1 refills | Status: DC

## 2017-12-07 NOTE — Telephone Encounter (Signed)
Klorcon OV: 05/12/17  LR:05/13/17  Olmesartan OV: 05/12/17 LR: 05/12/17 Sertraline OV:05/12/17 LR:05/12/17 Amlodipine OV: 05/12/17 LR: 05/12/17

## 2017-12-07 NOTE — Telephone Encounter (Signed)
Copied from [place] [id number]. Topic: Quick Communication - See Telephone Encounter >> Dec 07, 2017 12:50 PM Boyd Kerbs wrote: CRM for notification. See Telephone encounter for:  Prescription refill needed: Klorcon , Olmesartan, sertraline, Amlodipine  This is a new pharmacy he needs sent to  [id number] - [place], [place] - [address] [address] Phone: [phone] Fax: [phone]    12/07/17.

## 2017-12-25 IMAGING — MR MR FOOT*L* W/O CM
4 of 5 series · 20 of 40 positions shown · non-contrast
Comparison: Radiographs dated 12/12/2016

CLINICAL DATA: Nonhealing ulcer on the third toe of the left foot.

EXAM:
MRI OF THE LEFT FOOT WITHOUT CONTRAST
TECHNIQUE: Multiplanar, multisequence MR imaging of the left forefoot was
performed. No intravenous contrast was administered.

[Series 3: T1 · oblique · 4.0mm · 0.29mm/px · 6 of 30 slices shown (1 of 2)]
[im 1/30]
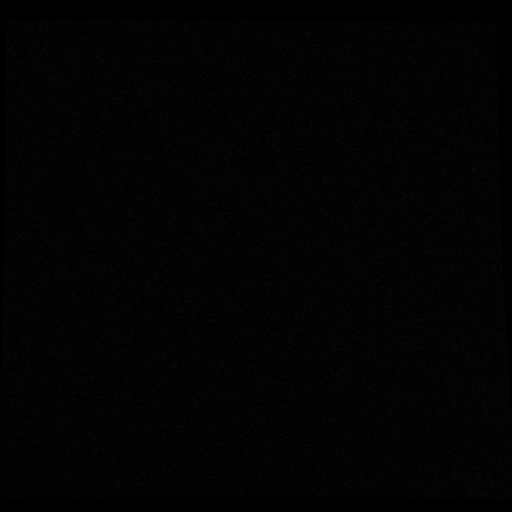
[im 3/30]
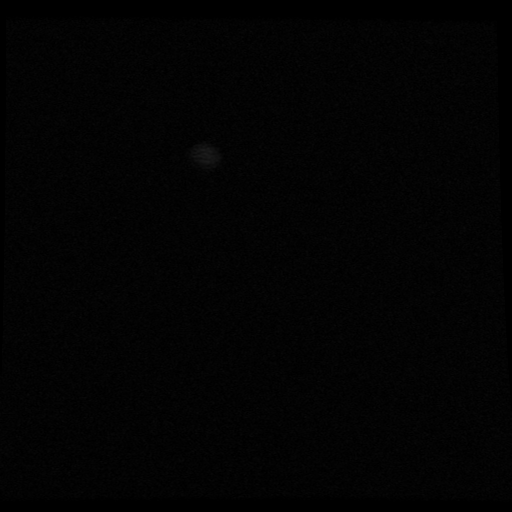
[im 6/30]
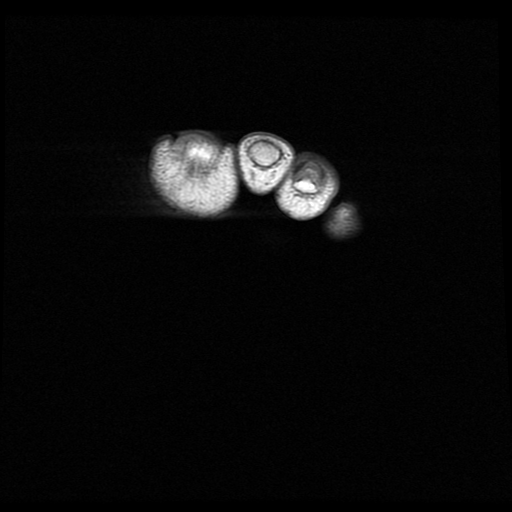
[im 9/30]
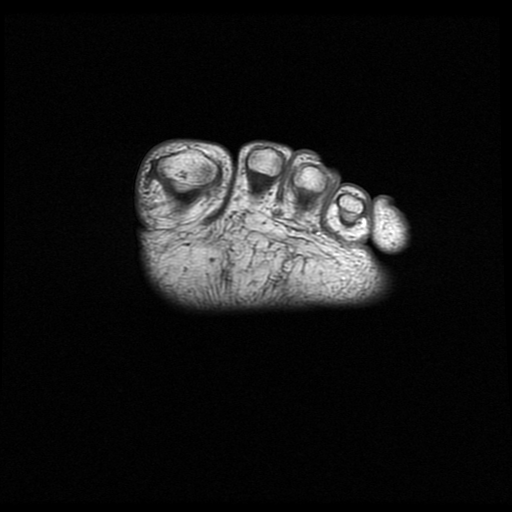
[im 15/30]
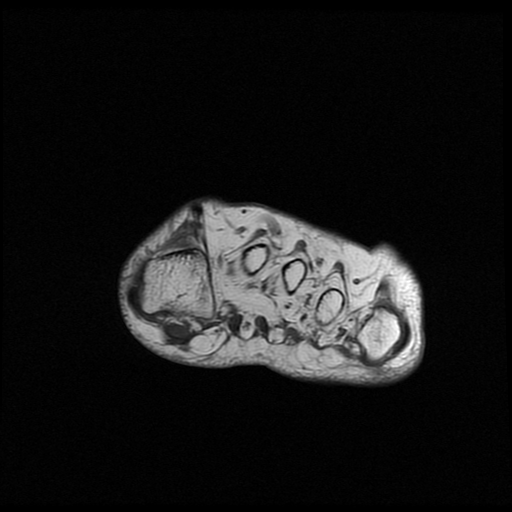
[im 27/30]
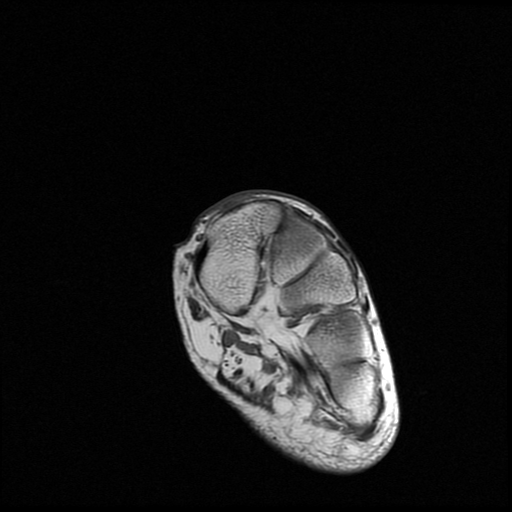

[Series 4: T2 fat-sat · oblique · 4.0mm · 0.29mm/px · 8 of 30 slices shown]
[im 1/30]
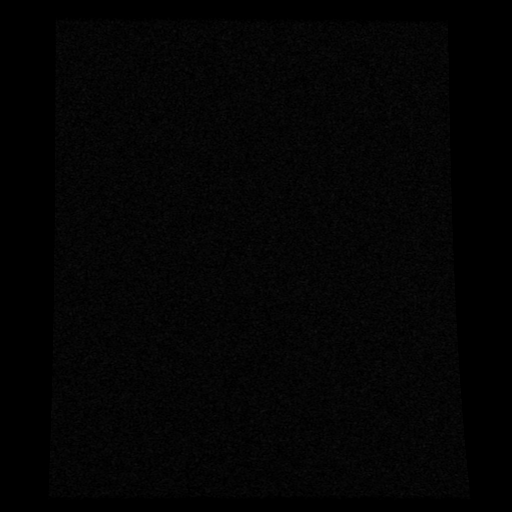
[im 4/30]
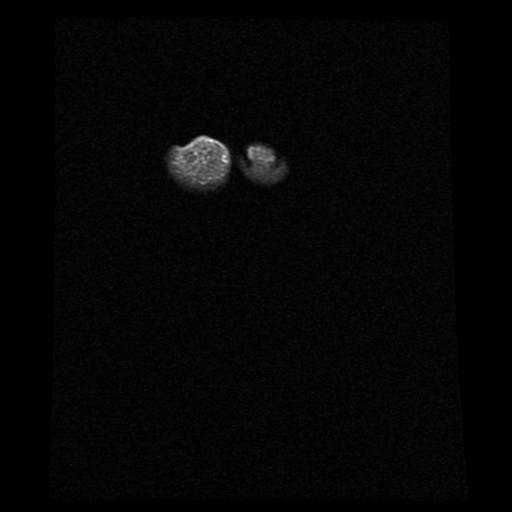
[im 10/30]
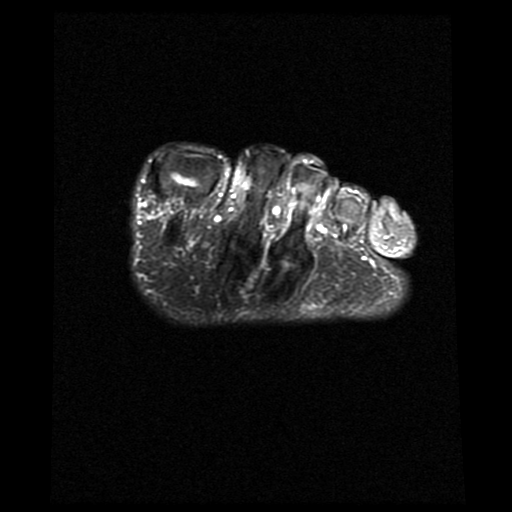
[im 13/30]
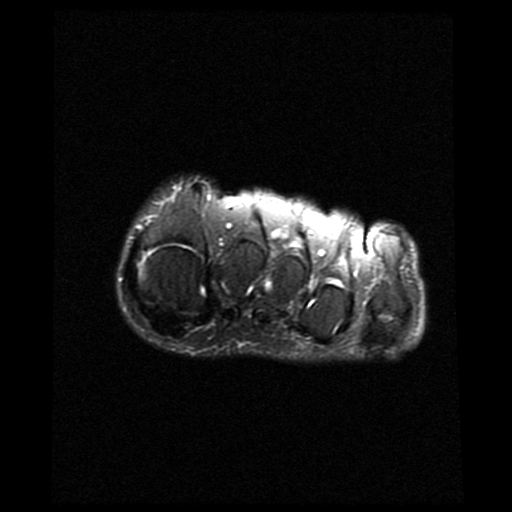
[im 17/30]
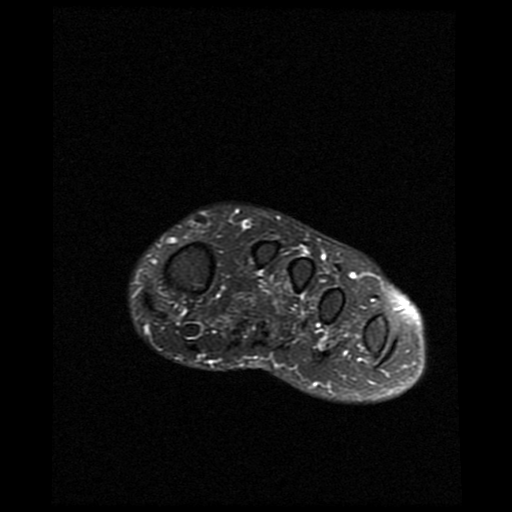
[im 20/30]
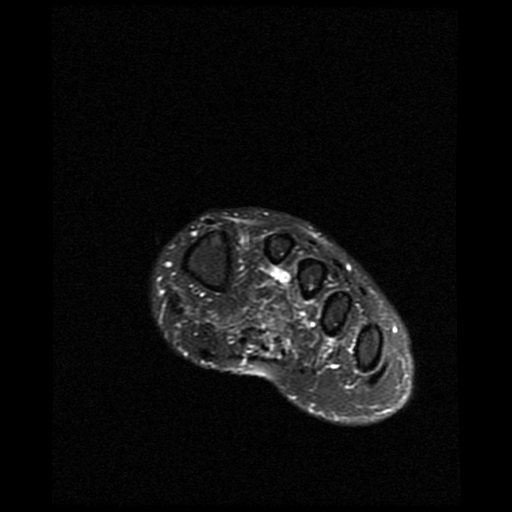
[im 26/30]
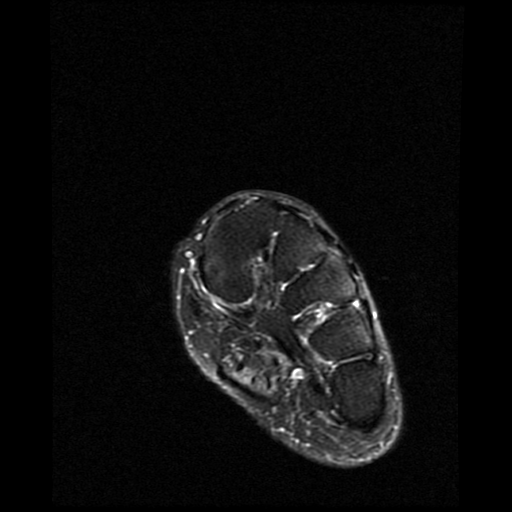
[im 30/30]
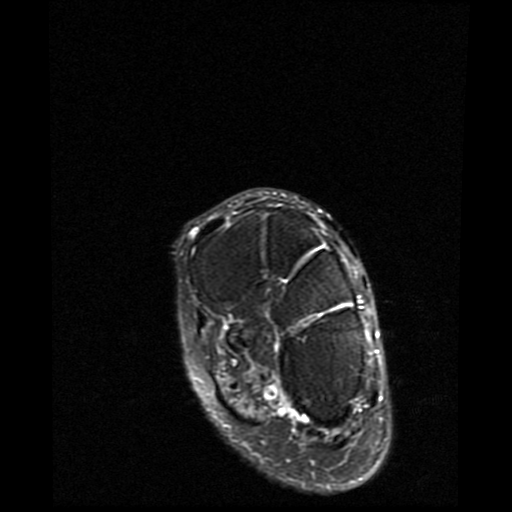

[Series 5: T1 · oblique · 4.0mm · 0.31mm/px · 3 of 18 slices shown (2 of 2)]
[im 4/18]
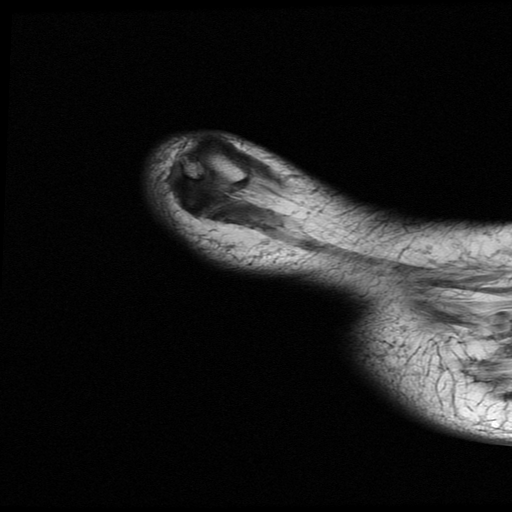
[im 11/18]
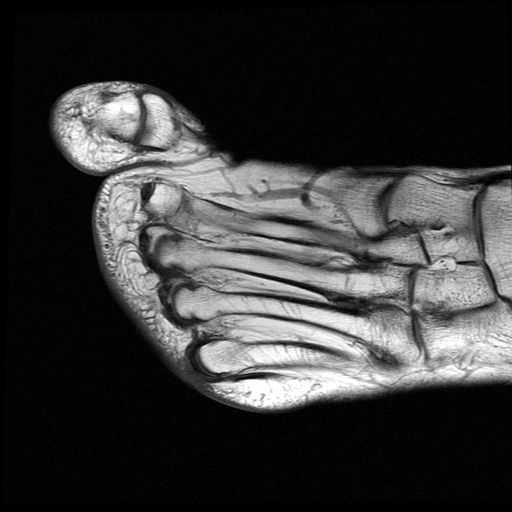
[im 18/18]
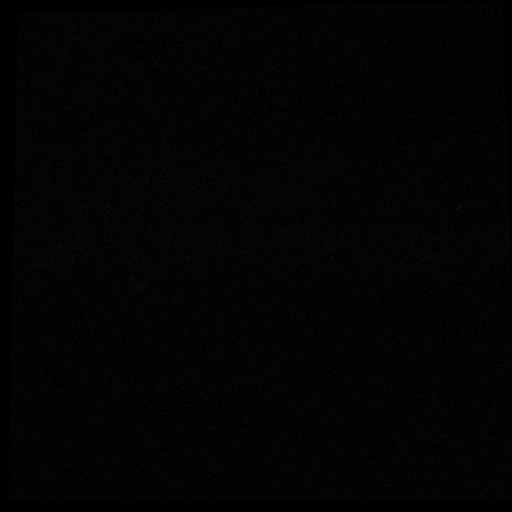

[Series 7: STIR · sagittal · 4.0mm · 0.31mm/px · 3 of 21 slices shown]
[im 4/21]
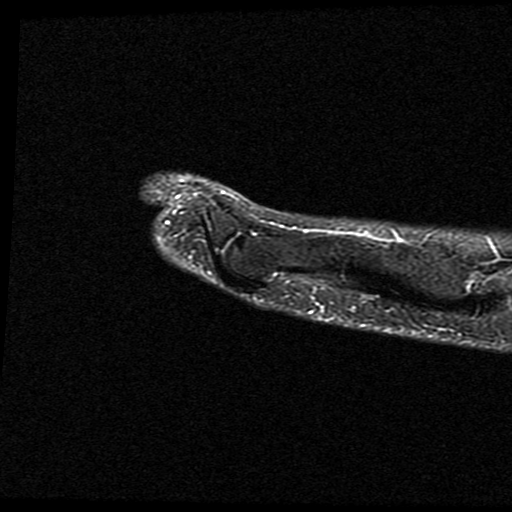
[im 11/21]
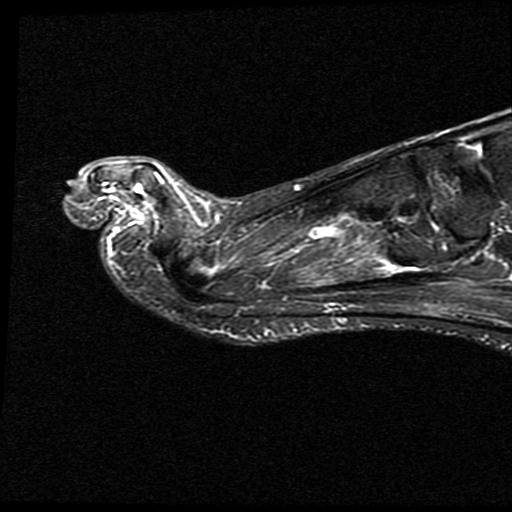
[im 17/21]
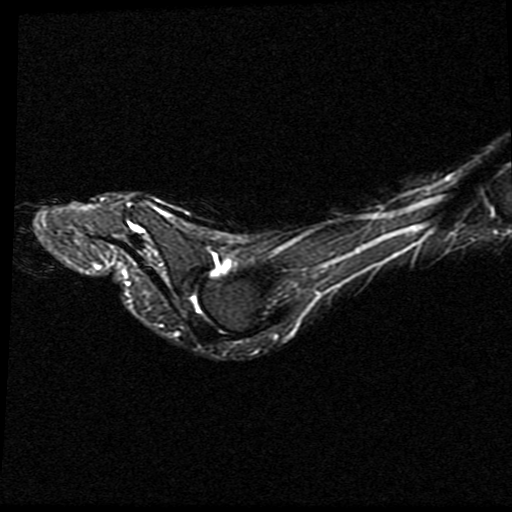

[20 of 40 positions shown; findings below may reference images not displayed]

FINDINGS: Bones/Joint/Cartilage

No osteomyelitis, bone edema, or other significant abnormalities.

Ligaments

Normal.

Muscles and Tendons

Normal.

Soft tissues

Normal.
IMPRESSION: Normal MRI of the left forefoot. Specifically, no evidence of
osteomyelitis or abscess of the third toe.

## 2017-12-29 DIAGNOSIS — Z89511 Acquired absence of right leg below knee: Secondary | ICD-10-CM | POA: Diagnosis not present

## 2018-02-14 ENCOUNTER — Encounter (HOSPITAL_COMMUNITY): Payer: Medicare HMO

## 2018-02-14 ENCOUNTER — Ambulatory Visit: Payer: Medicare HMO | Admitting: Vascular Surgery

## 2018-02-17 ENCOUNTER — Other Ambulatory Visit: Payer: Self-pay | Admitting: *Deleted

## 2018-02-17 MED ORDER — RIVAROXABAN 20 MG PO TABS: 20.0000 mg | ORAL_TABLET | Freq: Every day | ORAL | 2 refills | Status: DC

## 2018-03-06 ENCOUNTER — Other Ambulatory Visit: Payer: Self-pay

## 2018-03-06 DIAGNOSIS — I739 Peripheral vascular disease, unspecified: Secondary | ICD-10-CM

## 2018-03-07 ENCOUNTER — Ambulatory Visit: Payer: Medicare HMO | Admitting: Vascular Surgery

## 2018-03-07 ENCOUNTER — Encounter (HOSPITAL_COMMUNITY): Payer: Medicare HMO

## 2018-03-15 ENCOUNTER — Ambulatory Visit: Payer: Medicare HMO | Admitting: Nurse Practitioner

## 2018-03-29 ENCOUNTER — Ambulatory Visit: Payer: Medicare HMO | Admitting: Nurse Practitioner

## 2018-04-07 ENCOUNTER — Telehealth: Payer: Self-pay | Admitting: Nurse Practitioner

## 2018-04-07 DIAGNOSIS — E785 Hyperlipidemia, unspecified: Secondary | ICD-10-CM

## 2018-04-07 DIAGNOSIS — E781 Pure hyperglyceridemia: Secondary | ICD-10-CM

## 2018-04-07 MED ORDER — ATORVASTATIN CALCIUM 40 MG PO TABS: 40.0000 mg | ORAL_TABLET | Freq: Every day | ORAL | 0 refills | Status: DC

## 2018-04-07 NOTE — Telephone Encounter (Signed)
30 days supply sent in for the pt per [place]. Pt needs to keep up coming appt for more refills.

## 2018-04-07 NOTE — Telephone Encounter (Signed)
Refill request for lipitor 40 MG tab  Last refill was 08/09/17 for #90 tabs  Provider Wilfred Lacy, NP  LOV  05/12/17 NOV 04/25/18  Pharmacy  [facility]  [address] Lipid panel last  05/12/17 abnormal  Please review.

## 2018-04-07 NOTE — Telephone Encounter (Signed)
Copied from [place]. Topic: Quick Communication - Rx Refill/Question >> Apr 07, 2018 12:03 PM Synthia Innocent wrote: Medication: atorvastatin (LIPITOR) 40 MG tablet  Has the patient contacted their pharmacy? Yes.   (Agent: If no, request that the patient contact the pharmacy for the refill.) Preferred Pharmacy (with phone number or street name): [facility] on [address]  Agent: Please be advised that RX refills may take up to 3 business days. We ask that you follow-up with your pharmacy.

## 2018-04-23 ENCOUNTER — Other Ambulatory Visit: Payer: Self-pay | Admitting: Nurse Practitioner

## 2018-04-23 DIAGNOSIS — I10 Essential (primary) hypertension: Secondary | ICD-10-CM

## 2018-04-24 ENCOUNTER — Telehealth: Payer: Self-pay | Admitting: Nurse Practitioner

## 2018-04-24 NOTE — Telephone Encounter (Signed)
Tried to call the pt, cant leave vm. Will try again later.

## 2018-04-24 NOTE — Telephone Encounter (Signed)
[facility] [address] report mis [facility] of [facility]. [facility] someone else Lisinopril HCTZ 10/12.5 mg, pt received on 04/12/019 and took it for 30 days until he realized it was someone else medication, Jordan Arellano stated pt report no side effect.   Please advise.

## 2018-04-24 NOTE — Telephone Encounter (Signed)
Stop medication. Make sure he keeps appt for tomorrow. Will need labs drawn

## 2018-04-24 NOTE — Telephone Encounter (Signed)
Tried to call the pt, cant leave vm.

## 2018-04-25 ENCOUNTER — Ambulatory Visit: Payer: Medicare HMO | Admitting: Nurse Practitioner

## 2018-04-26 ENCOUNTER — Ambulatory Visit (INDEPENDENT_AMBULATORY_CARE_PROVIDER_SITE_OTHER): Payer: Medicare HMO | Admitting: Nurse Practitioner

## 2018-04-26 ENCOUNTER — Encounter: Payer: Self-pay | Admitting: Nurse Practitioner

## 2018-04-26 VITALS — BP 130/82 | HR 80 | Temp 97.4°F | Ht 73.0 in | Wt 157.0 lb

## 2018-04-26 DIAGNOSIS — IMO0002 Reserved for concepts with insufficient information to code with codable children: Secondary | ICD-10-CM

## 2018-04-26 DIAGNOSIS — E785 Hyperlipidemia, unspecified: Secondary | ICD-10-CM | POA: Diagnosis not present

## 2018-04-26 DIAGNOSIS — E0842 Diabetes mellitus due to underlying condition with diabetic polyneuropathy: Secondary | ICD-10-CM | POA: Diagnosis not present

## 2018-04-26 DIAGNOSIS — E0865 Diabetes mellitus due to underlying condition with hyperglycemia: Secondary | ICD-10-CM | POA: Diagnosis not present

## 2018-04-26 DIAGNOSIS — E1151 Type 2 diabetes mellitus with diabetic peripheral angiopathy without gangrene: Secondary | ICD-10-CM

## 2018-04-26 DIAGNOSIS — Z794 Long term (current) use of insulin: Secondary | ICD-10-CM

## 2018-04-26 DIAGNOSIS — I10 Essential (primary) hypertension: Secondary | ICD-10-CM | POA: Diagnosis not present

## 2018-04-26 DIAGNOSIS — Z114 Encounter for screening for human immunodeficiency virus [HIV]: Secondary | ICD-10-CM | POA: Diagnosis not present

## 2018-04-26 DIAGNOSIS — Z1159 Encounter for screening for other viral diseases: Secondary | ICD-10-CM | POA: Diagnosis not present

## 2018-04-26 DIAGNOSIS — E781 Pure hyperglyceridemia: Secondary | ICD-10-CM | POA: Diagnosis not present

## 2018-04-26 LAB — COMPREHENSIVE METABOLIC PANEL
ALK PHOS: 76 U/L (ref 39–117)
ALT: 8 U/L (ref 0–53)
AST: 8 U/L (ref 0–37)
Albumin: 4 g/dL (ref 3.5–5.2)
BILIRUBIN TOTAL: 0.5 mg/dL (ref 0.2–1.2)
BUN: 16 mg/dL (ref 6–23)
CO2: 27 mEq/L (ref 19–32)
Calcium: 9.1 mg/dL (ref 8.4–10.5)
Chloride: 104 mEq/L (ref 96–112)
Creatinine, Ser: 0.95 mg/dL (ref 0.40–1.50)
GFR: 84.36 mL/min (ref >60.00)
GLUCOSE: 157 mg/dL — AB (ref 70–99)
Potassium: 3.8 mEq/L (ref 3.5–5.1)
SODIUM: 138 meq/L (ref 135–145)
Total Protein: 7 g/dL (ref 6.0–8.3)

## 2018-04-26 LAB — MICROALBUMIN / CREATININE URINE RATIO
Creatinine,U: 219.9 mg/dL
MICROALB UR: 34.2 mg/dL — AB (ref 0.0–1.9)
MICROALB/CREAT RATIO: 15.6 mg/g (ref 0.0–30.0)

## 2018-04-26 LAB — LIPID PANEL
Cholesterol: 140 mg/dL (ref 0–200)
HDL: 28.8 mg/dL — ABNORMAL LOW (ref >39.00)
NONHDL: 110.95
Total CHOL/HDL Ratio: 5
Triglycerides: 326 mg/dL — ABNORMAL HIGH (ref 0.0–149.0)
VLDL: 65.2 mg/dL — ABNORMAL HIGH (ref 0.0–40.0)

## 2018-04-26 LAB — LDL CHOLESTEROL, DIRECT: Direct LDL: 63 mg/dL

## 2018-04-26 LAB — HEMOGLOBIN A1C: Hgb A1c MFr Bld: 6.9 % — ABNORMAL HIGH (ref 4.6–6.5)

## 2018-04-26 MED ORDER — INSULIN GLARGINE 100 UNITS/ML SOLOSTAR PEN: 10.0000 [IU] | PEN_INJECTOR | Freq: Every day | SUBCUTANEOUS | 1 refills | Status: DC

## 2018-04-26 MED ORDER — AMLODIPINE BESYLATE 5 MG PO TABS: 5.0000 mg | ORAL_TABLET | Freq: Every day | ORAL | 1 refills | Status: DC

## 2018-04-26 MED ORDER — ATORVASTATIN CALCIUM 20 MG PO TABS: 20.0000 mg | ORAL_TABLET | Freq: Every day | ORAL | 1 refills | Status: DC

## 2018-04-26 MED ORDER — PEN NEEDLES 31G X 6 MM MISC: 1.0000 "application " | Freq: Every day | 2 refills | Status: DC

## 2018-04-26 MED ORDER — METFORMIN HCL 500 MG PO TABS: 500.0000 mg | ORAL_TABLET | Freq: Two times a day (BID) | ORAL | 1 refills | Status: DC

## 2018-04-26 MED ORDER — HYDROCHLOROTHIAZIDE 12.5 MG PO TABS: 12.5000 mg | ORAL_TABLET | Freq: Every day | ORAL | 1 refills | Status: DC

## 2018-04-26 MED ORDER — POTASSIUM CHLORIDE CRYS ER 10 MEQ PO TBCR: 10.0000 meq | EXTENDED_RELEASE_TABLET | Freq: Every day | ORAL | 1 refills | Status: DC

## 2018-04-26 MED ORDER — FENOFIBRATE 145 MG PO TABS: 145.0000 mg | ORAL_TABLET | Freq: Every day | ORAL | 1 refills | Status: DC

## 2018-04-26 MED ORDER — OLMESARTAN MEDOXOMIL 40 MG PO TABS: 40.0000 mg | ORAL_TABLET | Freq: Every day | ORAL | 1 refills | Status: DC

## 2018-04-26 NOTE — Progress Notes (Signed)
Subjective:  Patient ID: Jordan Arellano, male    DOB: 12-Sep-1951  Age: 67 y.o. MRN: [id number]  CC: Follow-up (med refills/lab--fasting/shingle consult?)   HPI  Accompanied by niece.  DM: Does not check glucose at home.  Last HgbA1c 6.0 Controlled with metformin. Denies any nausea or diarrhea or ABD pain or hypoglycemia. Has not schedule ophthalmology appt. Persistent numbness in left LE. No improvement with use of lyrica  PAD: Managed by Dr. Bridgett Arellano. Last left leg angiogram done 09/2017. Upcoming appt next week. current use of xarelto, fenofibrate and lipitor.  He was not pleased with pain clinic, so he is looking for another one to manage chronic leg pain. No improvement with high dose of lyrica and gabapentin.  HTN: Controlled with amlodipine, HCTZ, benicar BP Readings from Last 3 Encounters:  04/26/18 130/82  11/11/17 130/79  10/04/17 97/82   Reviewed social and family Hx  Outpatient Medications Prior to Visit  Medication Sig Dispense Refill  . aspirin 81 MG tablet Take 81 mg by mouth daily.    . blood glucose meter kit and supplies KIT Dispense based on patient and insurance preference. Use two times daily as directed (before breakfast and at bedtime. (FOR ICD-10: E08.42) 1 each 2  . rivaroxaban (XARELTO) 20 MG TABS tablet Take 1 tablet (20 mg total) by mouth daily with supper. 30 tablet 2  . sertraline (ZOLOFT) 100 MG tablet Take 1 tablet (100 mg total) by mouth daily. 90 tablet 1  . amLODipine (NORVASC) 5 MG tablet Take 1 tablet (5 mg total) by mouth daily. 90 tablet 1  . atorvastatin (LIPITOR) 40 MG tablet Take 1 tablet (40 mg total) by mouth daily at 6 PM. Pt need to see PCP for more refills. 30 tablet 0  . fenofibrate (TRICOR) 145 MG tablet Take 1 tablet (145 mg total) by mouth daily. 90 tablet 0  . hydrochlorothiazide (HYDRODIURIL) 12.5 MG tablet TAKE 1 TABLET BY MOUTH ONCE DAILY -  NEED  TO  SEE  DOCTOR  FOR  FUTURE  REFILLS- 30 tablet 0  . insulin glargine  (LANTUS) 100 unit/mL SOPN Inject 0.15 mLs (15 Units total) into the skin at bedtime. 3 pen 1  . Insulin Pen Needle (PEN NEEDLES) 31G X 6 MM MISC 1 application by Does not apply route at bedtime. 100 each 2  . metFORMIN (GLUCOPHAGE) 500 MG tablet TAKE 1 TABLET BY MOUTH TWICE DAILY WITH A MEAL 180 tablet 0  . olmesartan (BENICAR) 40 MG tablet Take 1 tablet (40 mg total) by mouth daily. 90 tablet 1  . oxyCODONE (OXY IR/ROXICODONE) 5 MG immediate release tablet Take 1 tablet (5 mg total) by mouth every 4 (four) hours as needed for moderate pain. 20 tablet 0  . potassium chloride (K-DUR,KLOR-CON) 10 MEQ tablet Take 1 tablet (10 mEq total) by mouth daily. 30 tablet 3  . Rivaroxaban 15 & 20 MG TBPK Take as directed on package: Start with one '15mg'$  tablet by mouth twice a day with food. On Day 22, switch to one '20mg'$  tablet once a day with food. 51 each 0  . Rivaroxaban (XARELTO) 15 MG TABS tablet Take 1 tablet (15 mg total) by mouth 2 (two) times daily with a meal. (Patient not taking: Reported on 04/26/2018) 42 tablet 0   No facility-administered medications prior to visit.     ROS Review of Systems  Respiratory: Negative.   Cardiovascular: Negative.   Gastrointestinal: Negative for blood in stool, constipation, diarrhea and melena.  Genitourinary: Negative  for dysuria, frequency and urgency.  Musculoskeletal: Positive for joint pain. Negative for falls.  Neurological: Negative for dizziness and headaches.  Psychiatric/Behavioral: Positive for depression. Negative for suicidal ideas. The patient is not nervous/anxious.      Objective:  BP 130/82   Pulse 80   Temp (!) 97.4 F (36.3 C) (Oral)   Ht '6\' 1"'$  (1.854 m)   Wt 157 lb (71.2 kg)   SpO2 98%   BMI 20.71 kg/m   BP Readings from Last 3 Encounters:  04/26/18 130/82  11/11/17 130/79  10/04/17 97/82    Wt Readings from Last 3 Encounters:  04/26/18 157 lb (71.2 kg)  11/11/17 162 lb (73.5 kg)  10/04/17 172 lb 9.9 oz (78.3 kg)     Physical Exam  Constitutional: He is oriented to person, place, and time. No distress.  Neck: Normal range of motion. Neck supple. No thyromegaly present.  Cardiovascular: Normal rate and regular rhythm.  Pulmonary/Chest: Effort normal and breath sounds normal.  Abdominal: Soft. He exhibits no distension. There is no tenderness.  Musculoskeletal: He exhibits no edema.  Neurological: He is alert and oriented to person, place, and time.  Skin: Skin is warm and dry.  Completed diabetic foot exam on left foot. Right AKA  Vitals reviewed.   Lab Results  Component Value Date   WBC 9.5 10/04/2017   HGB 9.0 (L) 10/04/2017   HCT 28.0 (L) 10/04/2017   PLT 161 10/04/2017   GLUCOSE 157 (H) 04/26/2018   CHOL 140 04/26/2018   TRIG 326.0 (H) 04/26/2018   HDL 28.80 (L) 04/26/2018   LDLDIRECT 63.0 04/26/2018   LDLCALC UNABLE TO CALCULATE IF TRIGLYCERIDE OVER 400 mg/dL 03/27/2014   ALT 8 04/26/2018   AST 8 04/26/2018   NA 138 04/26/2018   K 3.8 04/26/2018   CL 104 04/26/2018   CREATININE 0.95 04/26/2018   BUN 16 04/26/2018   CO2 27 04/26/2018   TSH 2.91 02/03/2016   INR 1.00 09/30/2017   HGBA1C 6.9 (H) 04/26/2018   MICROALBUR 34.2 (H) 04/26/2018    Dg Chest Port 1 View  Result Date: 09/30/2017 CLINICAL DATA:  Preop exam. EXAM: PORTABLE CHEST 1 VIEW COMPARISON:  None. FINDINGS: Lungs are adequately inflated as patient is slightly rotated to the right. No consolidation or effusion. Cardiomediastinal silhouette is within normal. Bones and soft tissues are normal. IMPRESSION: No active disease. Electronically Signed   By: Jordan Arellano M.D.   On: 09/30/2017 19:14    Assessment & Plan:   Jordan Arellano was seen today for follow-up.  Diagnoses and all orders for this visit:  Essential hypertension -     Comprehensive metabolic panel -     olmesartan (BENICAR) 40 MG tablet; Take 1 tablet (40 mg total) by mouth daily. -     potassium chloride (K-DUR,KLOR-CON) 10 MEQ tablet; Take 1 tablet  (10 mEq total) by mouth daily. -     hydrochlorothiazide (HYDRODIURIL) 12.5 MG tablet; Take 1 tablet (12.5 mg total) by mouth daily. -     amLODipine (NORVASC) 5 MG tablet; Take 1 tablet (5 mg total) by mouth daily.  Hypertriglyceridemia -     Comprehensive metabolic panel -     Lipid panel -     fenofibrate (TRICOR) 145 MG tablet; Take 1 tablet (145 mg total) by mouth daily. -     atorvastatin (LIPITOR) 20 MG tablet; Take 1 tablet (20 mg total) by mouth daily at 6 PM.  DM (diabetes mellitus) with peripheral vascular complication ([place]) -  Hemoglobin A1c -     Microalbumin / creatinine urine ratio  Encounter for screening for human immunodeficiency virus (HIV) -     HIV antibody  Encounter for hepatitis C screening test for low risk patient -     Hepatitis C Antibody  Diabetes mellitus due to underlying condition, uncontrolled, with diabetic polyneuropathy, with long-term current use of insulin (HCC) -     insulin glargine (LANTUS) 100 unit/mL SOPN; Inject 0.1 mLs (10 Units total) into the skin at bedtime. -     Insulin Pen Needle (PEN NEEDLES) 31G X 6 MM MISC; 1 application by Does not apply route at bedtime.  Diabetes mellitus due to underlying condition, uncontrolled, with diabetic polyneuropathy, without long-term current use of insulin (HCC) -     metFORMIN (GLUCOPHAGE) 500 MG tablet; Take 1 tablet (500 mg total) by mouth 2 (two) times daily with a meal.  Hyperlipidemia, unspecified hyperlipidemia type -     atorvastatin (LIPITOR) 20 MG tablet; Take 1 tablet (20 mg total) by mouth daily at 6 PM.  Other orders -     LDL cholesterol, direct   I have discontinued Matteus Hoeg's oxyCODONE. I have also changed his insulin glargine, metFORMIN, hydrochlorothiazide, and atorvastatin. Additionally, I am having him maintain his aspirin, blood glucose meter kit and supplies, sertraline, rivaroxaban, olmesartan, potassium chloride, Pen Needles, fenofibrate, and amLODipine.  Meds  ordered this encounter  Medications  . insulin glargine (LANTUS) 100 unit/mL SOPN    Sig: Inject 0.1 mLs (10 Units total) into the skin at bedtime.    Dispense:  15 mL    Refill:  1    Order Specific Question:   Supervising Provider    Answer:   Lucille Passy [[id number]]  . olmesartan (BENICAR) 40 MG tablet    Sig: Take 1 tablet (40 mg total) by mouth daily.    Dispense:  90 tablet    Refill:  1    Order Specific Question:   Supervising Provider    Answer:   Lucille Passy [[id number]]  . potassium chloride (K-DUR,KLOR-CON) 10 MEQ tablet    Sig: Take 1 tablet (10 mEq total) by mouth daily.    Dispense:  90 tablet    Refill:  1    Order Specific Question:   Supervising Provider    Answer:   Lucille Passy [[id number]]  . metFORMIN (GLUCOPHAGE) 500 MG tablet    Sig: Take 1 tablet (500 mg total) by mouth 2 (two) times daily with a meal.    Dispense:  180 tablet    Refill:  1    Order Specific Question:   Supervising Provider    Answer:   Lucille Passy [[id number]]  . Insulin Pen Needle (PEN NEEDLES) 31G X 6 MM MISC    Sig: 1 application by Does not apply route at bedtime.    Dispense:  100 each    Refill:  2    Order Specific Question:   Supervising Provider    Answer:   Lucille Passy [[id number]]  . hydrochlorothiazide (HYDRODIURIL) 12.5 MG tablet    Sig: Take 1 tablet (12.5 mg total) by mouth daily.    Dispense:  90 tablet    Refill:  1    Order Specific Question:   Supervising Provider    Answer:   Lucille Passy [[id number]]  . fenofibrate (TRICOR) 145 MG tablet    Sig: Take 1 tablet (145 mg total) by mouth daily.    Dispense:  90 tablet    Refill:  1    90 day    Order Specific Question:   Supervising Provider    Answer:   Lucille Passy [[id number]]  . atorvastatin (LIPITOR) 20 MG tablet    Sig: Take 1 tablet (20 mg total) by mouth daily at 6 PM.    Dispense:  90 tablet    Refill:  1    Order Specific Question:   Supervising Provider    Answer:   Lucille Passy [[id number]]  . amLODipine (NORVASC) 5 MG tablet     Sig: Take 1 tablet (5 mg total) by mouth daily.    Dispense:  90 tablet    Refill:  1    Order Specific Question:   Supervising Provider    Answer:   Lucille Passy [[id number]]    Follow-up: Return in about 6 months (around 10/27/2018) for DM and HTN, hyperlipidemia (fasting).  Jordan Lacy, NP

## 2018-04-26 NOTE — Patient Instructions (Addendum)
Let me know if you need another referral to new pain clinic. You also need to make appt for diabetic eye exam.  Check glucose twice a day and record (before breakfast and at bedtime).  During your next OV we need to talk about the following: tobacco cessation, Pneumonia and Tdap vaccine, CT chest for lung cancer screen, referral to podiatry.  Stable hgbA1c at 6.9. Decrease lantus to 10units. Maintain metformin Abnormal urine microalbumin due to DM. Continue benicar. Improved triglyceride 662 to 326, low HDL. Continue fenofibrate and lipitor. Stable renal function Medication refills sent.   Diabetes and Foot Care Diabetes may cause you to have problems because of poor blood supply (circulation) to your feet and legs. This may cause the skin on your feet to become thinner, break easier, and heal more slowly. Your skin may become dry, and the skin may peel and crack. You may also have nerve damage in your legs and feet causing decreased feeling in them. You may not notice minor injuries to your feet that could lead to infections or more serious problems. Taking care of your feet is one of the most important things you can do for yourself. Follow these instructions at home:  Wear shoes at all times, even in the house. Do not go barefoot. Bare feet are easily injured.  Check your feet daily for blisters, cuts, and redness. If you cannot see the bottom of your feet, use a mirror or ask someone for help.  Wash your feet with warm water (do not use hot water) and mild soap. Then pat your feet and the areas between your toes until they are completely dry. Do not soak your feet as this can dry your skin.  Apply a moisturizing lotion or petroleum jelly (that does not contain alcohol and is unscented) to the skin on your feet and to dry, brittle toenails. Do not apply lotion between your toes.  Trim your toenails straight across. Do not dig under them or around the cuticle. File the edges of your nails  with an emery board or nail file.  Do not cut corns or calluses or try to remove them with medicine.  Wear clean socks or stockings every day. Make sure they are not too tight. Do not wear knee-high stockings since they may decrease blood flow to your legs.  Wear shoes that fit properly and have enough cushioning. To break in new shoes, wear them for just a few hours a day. This prevents you from injuring your feet. Always look in your shoes before you put them on to be sure there are no objects inside.  Do not cross your legs. This may decrease the blood flow to your feet.  If you find a minor scrape, cut, or break in the skin on your feet, keep it and the skin around it clean and dry. These areas may be cleansed with mild soap and water. Do not cleanse the area with peroxide, alcohol, or iodine.  When you remove an adhesive bandage, be sure not to damage the skin around it.  If you have a wound, look at it several times a day to make sure it is healing.  Do not use heating pads or hot water bottles. They may burn your skin. If you have lost feeling in your feet or legs, you may not know it is happening until it is too late.  Make sure your health care provider performs a complete foot exam at least annually or more often  if you have foot problems. Report any cuts, sores, or bruises to your health care provider immediately. Contact a health care provider if:  You have an injury that is not healing.  You have cuts or breaks in the skin.  You have an ingrown nail.  You notice redness on your legs or feet.  You feel burning or tingling in your legs or feet.  You have pain or cramps in your legs and feet.  Your legs or feet are numb.  Your feet always feel cold. Get help right away if:  There is increasing redness, swelling, or pain in or around a wound.  There is a red line that goes up your leg.  Pus is coming from a wound.  You develop a fever or as directed by your health  care provider.  You notice a bad smell coming from an ulcer or wound. This information is not intended to replace advice given to you by your health care provider. Make sure you discuss any questions you have with your health care provider. Document Released: 11/19/2000 Document Revised: 04/29/2016 Document Reviewed: 05/01/2013 Elsevier Interactive Patient Education  2017 [employer].

## 2018-04-27 LAB — HEPATITIS C ANTIBODY
Hepatitis C Ab: NONREACTIVE
SIGNAL TO CUT-OFF: 0.04 (ref <1.00)

## 2018-04-27 LAB — HIV ANTIBODY (ROUTINE TESTING W REFLEX): HIV 1&2 Ab, 4th Generation: NONREACTIVE

## 2018-04-28 ENCOUNTER — Encounter: Payer: Self-pay | Admitting: Nurse Practitioner

## 2018-05-02 ENCOUNTER — Telehealth: Payer: Self-pay | Admitting: Nurse Practitioner

## 2018-05-02 ENCOUNTER — Inpatient Hospital Stay (HOSPITAL_COMMUNITY): Admission: RE | Admit: 2018-05-02 | Payer: Medicare HMO | Source: Ambulatory Visit

## 2018-05-02 ENCOUNTER — Ambulatory Visit: Payer: Medicare HMO | Admitting: Vascular Surgery

## 2018-05-02 NOTE — Telephone Encounter (Signed)
Copied from [place] [id number]. Topic: Quick Communication - Appointment Cancellation >> Apr 25, 2018 11:54 AM Margot Ables wrote: Patient called to cancel appointment scheduled for today at 2:30pm with Nche, NP. Patient has rescheduled their appointment.  niece called stating she brings pt to appts and he scheduled without her, she cannot bring at this time - RS for 5/22 10:45am (requesting to waive no show fee)  Route to department's [facility] pool. >> May 02, 2018 10:34 AM [place], Lavella Lemons D wrote: Changed status of reason for cancellation. Should not receive a no-show fee.

## 2018-05-12 ENCOUNTER — Other Ambulatory Visit: Payer: Self-pay | Admitting: Nurse Practitioner

## 2018-05-12 NOTE — Telephone Encounter (Signed)
Rx refill request for medication from outside provider: rivaroxaban 20 mg    Last filled: 02/17/18 #30  LOV: 04/26/18  PCP: Nche  Pharmacy: verified

## 2018-05-12 NOTE — Telephone Encounter (Signed)
Tried to call the pt again. Cant leave vm.

## 2018-05-12 NOTE — Telephone Encounter (Signed)
Tried to call the pt, need to know if he is taking this med (last ov report not taking this med).

## 2018-05-12 NOTE — Telephone Encounter (Signed)
Copied from [place] [id number]. Topic: Quick Communication - Rx Refill/Question >> May 12, 2018  9:08 AM Bea Graff, NT wrote: Medication: rivaroxaban (XARELTO) 20 MG TABS tablet  Has the patient contacted their pharmacy? Yes.   (Agent: If no, request that the patient contact the pharmacy for the refill.) (Agent: If yes, when and what did the pharmacy advise?)  Preferred Pharmacy (with phone number or street name):  [facility] ([address] [place] [phone] (Phone) [phone] (Fax)      Agent: Please be advised that RX refills may take up to 3 business days. We ask that you follow-up with your pharmacy.

## 2018-05-15 NOTE — Telephone Encounter (Signed)
Please see refill request below. °

## 2018-05-15 NOTE — Telephone Encounter (Signed)
Tried to call the pt again, can not leave vm.

## 2018-05-18 NOTE — Telephone Encounter (Signed)
Tried to call the pt, cant leave vm  Jordan Arellano is not in the office, we never prescribe Xarelto before, he needs to contact his vein and vascular doctor for this.   Pt needs an appt for leg pain evaluation.

## 2018-05-18 NOTE — Telephone Encounter (Signed)
Patient is still taking the script as a blood thinner for his left leg post surgery. He has vein concerns. Please send script to [facility] [address]. He also wants to know if there is anything he can take for his leg pain?

## 2018-05-19 NOTE — Telephone Encounter (Signed)
Tried to call the pt again. Cant leave vm. Pt needs an appt to address med.

## 2018-05-29 ENCOUNTER — Telehealth: Payer: Self-pay | Admitting: Family Medicine

## 2018-05-29 DIAGNOSIS — I10 Essential (primary) hypertension: Secondary | ICD-10-CM

## 2018-05-29 NOTE — Telephone Encounter (Signed)
Copied from [place] [id number]. Topic: Quick Communication - Rx Refill/Question >> May 29, 2018  1:19 PM Boyd Kerbs wrote: Medication:  hydrochlorothiazide (HYDRODIURIL) 12.5 MG tablet Ins. Will only pay for 30 days at a time - and need another prescription called in for this. Pt is out of blood thinner.  He is also asking for pain medication for leg  Has the patient contacted their pharmacy? Yes.   (Agent: If no, request that the patient contact the pharmacy for the refill.) (Agent: If yes, when and what did the pharmacy advise?)  Preferred Pharmacy (with phone number or street name):   [facility] ([address] [place] [address] [place] ([place]) [place] [address] Phone: [phone] Fax: [phone]    Agent: Please be advised that RX refills may take up to 3 business days. We ask that you follow-up with your pharmacy.

## 2018-05-30 MED ORDER — HYDROCHLOROTHIAZIDE 12.5 MG PO TABS: 12.5000 mg | ORAL_TABLET | Freq: Every day | ORAL | 5 refills | Status: DC

## 2018-05-30 NOTE — Telephone Encounter (Signed)
Old Rx for hydrochlorothiazide cancelled, new rx sent in.   Xarelto refill---we never prescribe this med for the pt, he need to contact his vein and vascular doctor for this or make an appt with [place].   Leg pain--Nche is not in the office, pt need an appt to be evaluate.   These 2 request came 2 wks ago, I have tried to call so many times, cant leave vm and pt never return my call (see previous note).   Today I called [phone] and [phone] cant leave vm on any #.

## 2018-05-30 NOTE — Telephone Encounter (Signed)
Gave pt the message below. Pt verbalized understand and will call vascular doc for refill on xarelto. He is aware he needs an appt to evaluate leg pain.

## 2018-07-07 ENCOUNTER — Telehealth: Payer: Self-pay | Admitting: Nurse Practitioner

## 2018-07-07 ENCOUNTER — Other Ambulatory Visit: Payer: Self-pay | Admitting: *Deleted

## 2018-07-07 DIAGNOSIS — F32A Depression, unspecified: Secondary | ICD-10-CM

## 2018-07-07 DIAGNOSIS — F419 Anxiety disorder, unspecified: Principal | ICD-10-CM

## 2018-07-07 DIAGNOSIS — F329 Major depressive disorder, single episode, unspecified: Secondary | ICD-10-CM

## 2018-07-07 MED ORDER — SERTRALINE HCL 100 MG PO TABS: 100.0000 mg | ORAL_TABLET | Freq: Every day | ORAL | 1 refills | Status: DC

## 2018-07-07 NOTE — Telephone Encounter (Signed)
Copied from [place] [id number]. Topic: Quick Communication - Rx Refill/Question >> Jul 07, 2018  1:19 PM Quin Mathenia B, NT wrote: Medication: amLODipine (NORVASC) 5 MG tablet  sertraline (ZOLOFT) 100 MG tablet atorvastatin (LIPITOR) 20 MG tablet   Has the patient contacted their pharmacy? Yes.   (Agent: If no, request that the patient contact the pharmacy for the refill.) (Agent: If yes, when and what did the pharmacy advise?)  Preferred Pharmacy (with phone number or street name): [facility] ([place]), [place]  Agent: Please be advised that RX refills may take up to 3 business days. We ask that you follow-up with your pharmacy.

## 2018-07-07 NOTE — Telephone Encounter (Signed)
Patient should have refills on amlodipine and atorvastatin. Sertraline refilled per protocol- LOV: 04/26/18

## 2018-07-30 ENCOUNTER — Other Ambulatory Visit: Payer: Self-pay | Admitting: Nurse Practitioner

## 2018-07-30 DIAGNOSIS — I10 Essential (primary) hypertension: Secondary | ICD-10-CM

## 2018-09-04 ENCOUNTER — Other Ambulatory Visit: Payer: Self-pay

## 2018-09-04 DIAGNOSIS — I739 Peripheral vascular disease, unspecified: Secondary | ICD-10-CM

## 2018-09-12 ENCOUNTER — Ambulatory Visit: Payer: Medicare HMO | Admitting: Vascular Surgery

## 2018-09-12 ENCOUNTER — Encounter (HOSPITAL_COMMUNITY): Payer: Medicare HMO

## 2018-10-30 ENCOUNTER — Ambulatory Visit: Payer: Medicare HMO | Admitting: Nurse Practitioner

## 2018-10-30 DIAGNOSIS — Z0289 Encounter for other administrative examinations: Secondary | ICD-10-CM

## 2018-11-14 ENCOUNTER — Other Ambulatory Visit: Payer: Self-pay

## 2018-11-14 ENCOUNTER — Ambulatory Visit (INDEPENDENT_AMBULATORY_CARE_PROVIDER_SITE_OTHER): Payer: Medicare HMO | Admitting: Vascular Surgery

## 2018-11-14 ENCOUNTER — Ambulatory Visit (HOSPITAL_COMMUNITY)
Admission: RE | Admit: 2018-11-14 | Discharge: 2018-11-14 | Disposition: A | Discharge status: Home / Self Care | Payer: Medicare HMO | Source: Ambulatory Visit | Attending: Vascular Surgery | Admitting: Vascular Surgery

## 2018-11-14 ENCOUNTER — Encounter: Payer: Self-pay | Admitting: Vascular Surgery

## 2018-11-14 VITALS — BP 136/80 | HR 79 | Temp 96.8°F | Resp 18 | Ht 73.0 in | Wt 165.0 lb

## 2018-11-14 DIAGNOSIS — I739 Peripheral vascular disease, unspecified: Secondary | ICD-10-CM | POA: Insufficient documentation

## 2018-11-14 DIAGNOSIS — Z89511 Acquired absence of right leg below knee: Secondary | ICD-10-CM | POA: Diagnosis not present

## 2018-11-14 NOTE — Progress Notes (Signed)
Vascular and Vein Specialist of [facility]  Patient name: Jordan Arellano MRN: [id number] DOB: [birth year] Sex: male  REASON FOR VISIT: Follow-up peripheral vascular occlusive disease  HPI: Jordan Arellano is a 67 y.o. male here today for follow-up.  He has an extensive past history.  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  He presented with nonhealing wounds on his left foot and underwent left femoral to below-knee popliteal bypass with Gore-Tex graft by myself in January 2018.  He did not have adequate saphenous vein for bypass.  Eventually had healing of his foot.  He presented in October 2018 with occlusion of his bypass and underwent thrombectomy and revision of this with Dr.Chen.  He presents today for follow-up.  Had not been seen following his surgery.  He reports rest pain in his left foot.  He does walk with a right below-knee prosthesis and a cane.  He fortunately has had no tissue loss.  He unfortunately continues to smoke cigarettes.  Past Medical History:  Diagnosis Date  . Depression   . Hyperlipidemia   . Hypertension   . PAD (peripheral artery disease) ([place]) ~2007   s/p R BKA for non-healing wound  . Stroke ([place]) 03/2014   MRI: Acute nonhemorrhagic left paracentral pontine infarct. 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  Marland Kitchen TIA (transient ischemic attack) 01/2014    Family History  Problem Relation Age of Onset  . Hypertension Mother        Does not know history  . Heart disease Mother   . Stroke Mother   . Diabetes Mother   . Hypertension Father   . Heart disease Father   . Stroke Father   . Diabetes Sister   . Hypertension Sister   . Heart disease Brother   . Hypertension Brother     SOCIAL HISTORY: Social History   Tobacco Use  . Smoking status: Current Some Day Smoker   Packs/day: 0.50    Years: 35.00    Pack years: 17.50    Types: Cigarettes  . Smokeless tobacco: Never Used  . Tobacco comment: zyban vs chantix  Substance Use Topics  . Alcohol use: No    No Known Allergies  Current Outpatient Medications  Medication Sig Dispense Refill  . amLODipine (NORVASC) 5 MG tablet Take 1 tablet (5 mg total) by mouth daily. 90 tablet 1  . aspirin 81 MG tablet Take 81 mg by mouth daily.    Marland Kitchen atorvastatin (LIPITOR) 20 MG tablet Take 1 tablet (20 mg total) by mouth daily at 6 PM. 90 tablet 1  . blood glucose meter kit and supplies KIT Dispense based on patient and insurance preference. Use two times daily as directed (before breakfast and at bedtime. (FOR ICD-10: E08.42) 1 each 2  . fenofibrate (TRICOR) 145 MG tablet Take 1 tablet (145 mg total) by mouth daily. 90 tablet 1  . hydrochlorothiazide (HYDRODIURIL) 12.5 MG tablet Take 1 tablet (12.5 mg total) by mouth daily. 30 tablet 5  . insulin glargine (LANTUS) 100 unit/mL SOPN Inject 0.1 mLs (10 Units total) into the skin at bedtime. 15 mL 1  . Insulin Pen Needle (PEN NEEDLES) 31G X 6 MM MISC 1 application by Does not apply route at bedtime. 100 each 2  . metFORMIN (GLUCOPHAGE) 500 MG tablet Take 1 tablet (500 mg total) by mouth 2 (two) times daily  with a meal. 180 tablet 1  . olmesartan (BENICAR) 40 MG tablet TAKE 1 TABLET BY MOUTH ONCE DAILY 90 tablet 1  . potassium chloride (K-DUR,KLOR-CON) 10 MEQ tablet Take 1 tablet (10 mEq total) by mouth daily. 90 tablet 1  . sertraline (ZOLOFT) 100 MG tablet Take 1 tablet (100 mg total) by mouth daily. 90 tablet 1  . rivaroxaban (XARELTO) 20 MG TABS tablet Take 1 tablet (20 mg total) by mouth daily with supper. (Patient not taking: Reported on 11/14/2018) 30 tablet 2   No current facility-administered medications for this visit.     REVIEW OF SYSTEMS:  '[X]'$  denotes positive finding, '[ ]'$  denotes negative finding Cardiac  Comments:  Chest pain or chest pressure:      Shortness of breath upon exertion:    Short of breath when lying flat:    Irregular heart rhythm:        Vascular    Pain in calf, thigh, or hip brought on by ambulation:    Pain in feet at night that wakes you up from your sleep:  x   Blood clot in your veins:    Leg swelling:  x         PHYSICAL EXAM: Vitals:   11/14/18 1134  BP: 136/80  Pulse: 79  Resp: 18  Temp: (!) 96.8 F (36 C)  TempSrc: Oral  SpO2: 99%  Weight: 165 lb (74.8 kg)  Height: '6\' 1"'$  (1.854 m)    GENERAL: The patient is a well-nourished male, in no acute distress. The vital signs are documented above. CARDIOVASCULAR: I do not palpate a left popliteal or pedal pulses PULMONARY: There is good air exchange  MUSCULOSKELETAL: There are no major deformities or cyanosis.  Right below-knee amputation NEUROLOGIC: No focal weakness or paresthesias are detected. SKIN: There are no ulcers or rashes noted. PSYCHIATRIC: The patient has a normal affect.  DATA:  Noninvasive studies show monophasic posterior tibial signal in the left foot.  Ankle arm index is 0.30.  Most recent ankle arm index was 0.52 October 2018  MEDICAL ISSUES: I discussed options with the patient.  He is having some rest pain but no tissue loss.  Recommended no invasive arteriogram or potential redo bypass unless he does develop tissue loss.  He is tolerating his level of rest pain currently.  I explained that with no usable vein for bypass that his chances for successful a third time redo prosthetic would be quite low.  We will see him again in 1 year with repeat noninvasive studies and office visit.  He will notify us in the meantime should he develop any tissue loss.  We will continue daily aspirin therapy.  Is asking about options for pain control.  Explained that we would not recommend narcotics for chronic pain and would recommend ibuprofen as needed    Rosetta Posner, MD [facility] Vascular and Vein Specialists of [facility] Tel [phone] Pager [phone]

## 2018-11-17 ENCOUNTER — Other Ambulatory Visit: Payer: Self-pay | Admitting: Nurse Practitioner

## 2018-11-17 DIAGNOSIS — I10 Essential (primary) hypertension: Secondary | ICD-10-CM

## 2018-11-17 DIAGNOSIS — E781 Pure hyperglyceridemia: Secondary | ICD-10-CM

## 2018-11-17 MED ORDER — POTASSIUM CHLORIDE CRYS ER 10 MEQ PO TBCR: 10.0000 meq | EXTENDED_RELEASE_TABLET | Freq: Every day | ORAL | 1 refills | Status: DC

## 2018-11-17 MED ORDER — FENOFIBRATE 145 MG PO TABS: 145.0000 mg | ORAL_TABLET | Freq: Every day | ORAL | 1 refills | Status: DC

## 2018-11-17 NOTE — Telephone Encounter (Signed)
Copied from [employer] [id number]. Topic: Quick Communication - Rx Refill/Question >> Nov 17, 2018  2:32 PM Bea Graff, NT wrote: Medication: potassium chloride (K-DUR,KLOR-CON) 10 MEQ tablet and fenofibrate (TRICOR) 145 MG tablet   Has the patient contacted their pharmacy? No (Agent: If no, request that the patient contact the pharmacy for the refill.) (Agent: If yes, when and what did the pharmacy advise?)  Preferred Pharmacy (with phone number or street name): [facility] ([address] [place] [phone] (Phone) [phone] (Fax)    Agent: Please be advised that RX refills may take up to 3 business days. We ask that you follow-up with your pharmacy.

## 2018-11-21 ENCOUNTER — Telehealth: Payer: Self-pay | Admitting: Nurse Practitioner

## 2018-11-21 ENCOUNTER — Encounter: Payer: Self-pay | Admitting: Nurse Practitioner

## 2018-11-21 ENCOUNTER — Ambulatory Visit (INDEPENDENT_AMBULATORY_CARE_PROVIDER_SITE_OTHER): Payer: Medicare HMO | Admitting: Nurse Practitioner

## 2018-11-21 VITALS — BP 124/76 | HR 76 | Temp 97.7°F | Ht 73.0 in | Wt 163.0 lb

## 2018-11-21 DIAGNOSIS — I10 Essential (primary) hypertension: Secondary | ICD-10-CM | POA: Diagnosis not present

## 2018-11-21 DIAGNOSIS — Z125 Encounter for screening for malignant neoplasm of prostate: Secondary | ICD-10-CM

## 2018-11-21 DIAGNOSIS — F419 Anxiety disorder, unspecified: Secondary | ICD-10-CM

## 2018-11-21 DIAGNOSIS — E0865 Diabetes mellitus due to underlying condition with hyperglycemia: Secondary | ICD-10-CM

## 2018-11-21 DIAGNOSIS — I739 Peripheral vascular disease, unspecified: Secondary | ICD-10-CM

## 2018-11-21 DIAGNOSIS — E781 Pure hyperglyceridemia: Secondary | ICD-10-CM

## 2018-11-21 DIAGNOSIS — IMO0002 Reserved for concepts with insufficient information to code with codable children: Secondary | ICD-10-CM

## 2018-11-21 DIAGNOSIS — D649 Anemia, unspecified: Secondary | ICD-10-CM

## 2018-11-21 DIAGNOSIS — F329 Major depressive disorder, single episode, unspecified: Secondary | ICD-10-CM

## 2018-11-21 DIAGNOSIS — E785 Hyperlipidemia, unspecified: Secondary | ICD-10-CM

## 2018-11-21 DIAGNOSIS — E0842 Diabetes mellitus due to underlying condition with diabetic polyneuropathy: Secondary | ICD-10-CM

## 2018-11-21 DIAGNOSIS — E1151 Type 2 diabetes mellitus with diabetic peripheral angiopathy without gangrene: Secondary | ICD-10-CM | POA: Diagnosis not present

## 2018-11-21 DIAGNOSIS — G629 Polyneuropathy, unspecified: Secondary | ICD-10-CM

## 2018-11-21 DIAGNOSIS — F32A Depression, unspecified: Secondary | ICD-10-CM

## 2018-11-21 DIAGNOSIS — E782 Mixed hyperlipidemia: Secondary | ICD-10-CM

## 2018-11-21 LAB — CBC WITH DIFFERENTIAL/PLATELET
BASOS PCT: 0.8 % (ref 0.0–3.0)
Basophils Absolute: 0.1 10*3/uL (ref 0.0–0.1)
Eosinophils Absolute: 0.6 10*3/uL (ref 0.0–0.7)
Eosinophils Relative: 4.6 % (ref 0.0–5.0)
HCT: 39.7 % (ref 39.0–52.0)
Hemoglobin: 13.5 g/dL (ref 13.0–17.0)
Lymphocytes Relative: 36.6 % (ref 12.0–46.0)
Lymphs Abs: 4.5 10*3/uL — ABNORMAL HIGH (ref 0.7–4.0)
MCHC: 33.9 g/dL (ref 30.0–36.0)
MCV: 90.7 fl (ref 78.0–100.0)
Monocytes Absolute: 0.8 10*3/uL (ref 0.1–1.0)
Monocytes Relative: 6.8 % (ref 3.0–12.0)
Neutro Abs: 6.2 10*3/uL (ref 1.4–7.7)
Neutrophils Relative %: 51.2 % (ref 43.0–77.0)
Platelets: 342 10*3/uL (ref 150.0–400.0)
RBC: 4.38 Mil/uL (ref 4.22–5.81)
RDW: 15.8 % — AB (ref 11.5–15.5)
WBC: 12.2 10*3/uL — ABNORMAL HIGH (ref 4.0–10.5)

## 2018-11-21 LAB — IBC PANEL
Iron: 60 ug/dL (ref 42–165)
Saturation Ratios: 17.4 % — ABNORMAL LOW (ref 20.0–50.0)
Transferrin: 247 mg/dL (ref 212.0–360.0)

## 2018-11-21 LAB — COMPREHENSIVE METABOLIC PANEL
ALK PHOS: 59 U/L (ref 39–117)
ALT: 12 U/L (ref 0–53)
AST: 13 U/L (ref 0–37)
Albumin: 4.3 g/dL (ref 3.5–5.2)
BILIRUBIN TOTAL: 0.4 mg/dL (ref 0.2–1.2)
BUN: 37 mg/dL — ABNORMAL HIGH (ref 6–23)
CO2: 23 mEq/L (ref 19–32)
Calcium: 9.2 mg/dL (ref 8.4–10.5)
Chloride: 100 mEq/L (ref 96–112)
Creatinine, Ser: 1.43 mg/dL (ref 0.40–1.50)
GFR: 52.53 mL/min — ABNORMAL LOW (ref >60.00)
Glucose, Bld: 125 mg/dL — ABNORMAL HIGH (ref 70–99)
Potassium: 4.3 mEq/L (ref 3.5–5.1)
Sodium: 133 mEq/L — ABNORMAL LOW (ref 135–145)
TOTAL PROTEIN: 7.2 g/dL (ref 6.0–8.3)

## 2018-11-21 LAB — TSH: TSH: 2.5 u[IU]/mL (ref 0.35–4.50)

## 2018-11-21 LAB — LIPID PANEL
Cholesterol: 232 mg/dL — ABNORMAL HIGH (ref 0–200)
HDL: 27.4 mg/dL — ABNORMAL LOW (ref >39.00)
Total CHOL/HDL Ratio: 8
Triglycerides: 1678 mg/dL — ABNORMAL HIGH (ref 0.0–149.0)

## 2018-11-21 LAB — PSA: PSA: 1.89 ng/mL (ref 0.10–4.00)

## 2018-11-21 LAB — B12 AND FOLATE PANEL
Folate: 13.4 ng/mL (ref >5.9)
Vitamin B-12: 285 pg/mL (ref 211–911)

## 2018-11-21 LAB — LDL CHOLESTEROL, DIRECT: LDL DIRECT: 57 mg/dL

## 2018-11-21 MED ORDER — SERTRALINE HCL 100 MG PO TABS: 100.0000 mg | ORAL_TABLET | Freq: Every day | ORAL | 3 refills | Status: DC

## 2018-11-21 MED ORDER — AMLODIPINE BESYLATE 5 MG PO TABS: 5.0000 mg | ORAL_TABLET | Freq: Every day | ORAL | 3 refills | Status: DC

## 2018-11-21 NOTE — Telephone Encounter (Signed)
Cologuard order form faxed. Waiting for result.

## 2018-11-21 NOTE — Assessment & Plan Note (Signed)
Persistent, no improvement in gabapentin and cymbalta, minimal improvement with lyrica, was referred to pain clinic but he was not happy with care. Will like to resume lyrica for leg numbness and tingling.

## 2018-11-21 NOTE — Progress Notes (Signed)
Subjective:  Patient ID: Jordan Arellano, male    DOB: [birth year]  Age: 67 y.o. MRN: [id number]  CC: Follow-up (DM and HTN, hyperlipidemia -not fasitng/med consult?. pt is complaining of burnning sensation and numbness in legs.--going on for a while--vein doctor cant do much. FYI--no eye or colonoscopy check this year,flu shot give him  hiccups for 2 wks.)  HPI Accompanied by his sister per patient. Sister in waiting room  Lives alone. Sister helps with grocery shopping and transporation to  Appointments.  DM: Last hgbA1c of 6.9 Current use of metformin '500mg'$  home glucose checks: 130s-140s. Needs opthalmology referral  HTN: Controlled with benicar, HCTZ, and amlodipine BP Readings from Last 3 Encounters:  11/21/18 124/76  11/14/18 136/80  04/26/18 130/82   Tobacco use: Continuous use, no interest in quitting at this time.  declined CT chest low dose.  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 Will like to resume lyrica for leg numbness and tingling.  Hyperlipidemia: Current use of lipitor and tricor LDL and triglyceride not at goal  Depression: improved with zoloft Depression screen [facility] 2/9 11/21/2018 04/26/2018  Decreased Interest 0 0  Down, Depressed, Hopeless 0 0  PHQ - 2 Score 0 0  Altered sleeping 0 -  Tired, decreased energy 0 -  Change in appetite 0 -  Feeling bad or failure about yourself  0 -  Trouble concentrating 0 -  Moving slowly or fidgety/restless 0 -  Suicidal thoughts 0 -  PHQ-9 Score 0 -   GAD 7 : Generalized Anxiety Score 11/21/2018  Nervous, Anxious, on Edge 0  Control/stop worrying 0  Worry too much - different things 0  Trouble relaxing 0  Restless 0  Easily annoyed or irritable 0  Afraid - awful might happen 0  Total GAD 7 Score 0    Reviewed past Medical, Social and Family history today.  Outpatient Medications  Prior to Visit  Medication Sig Dispense Refill  . aspirin 81 MG tablet Take 81 mg by mouth daily.    . fenofibrate (TRICOR) 145 MG tablet Take 1 tablet (145 mg total) by mouth daily. 90 tablet 1  . insulin glargine (LANTUS) 100 unit/mL SOPN Inject 0.1 mLs (10 Units total) into the skin at bedtime. 15 mL 1  . Insulin Pen Needle (PEN NEEDLES) 31G X 6 MM MISC 1 application by Does not apply route at bedtime. 100 each 2  . potassium chloride (K-DUR,KLOR-CON) 10 MEQ tablet Take 1 tablet (10 mEq total) by mouth daily. 90 tablet 1  . atorvastatin (LIPITOR) 20 MG tablet Take 1 tablet (20 mg total) by mouth daily at 6 PM. 90 tablet 1  . hydrochlorothiazide (HYDRODIURIL) 12.5 MG tablet Take 1 tablet (12.5 mg total) by mouth daily. 30 tablet 5  . metFORMIN (GLUCOPHAGE) 500 MG tablet Take 1 tablet (500 mg total) by mouth 2 (two) times daily with a meal. 180 tablet 1  . olmesartan (BENICAR) 40 MG tablet TAKE 1 TABLET BY MOUTH ONCE DAILY 90 tablet 1  . sertraline (ZOLOFT) 100 MG tablet Take 1 tablet (100 mg total) by mouth daily. 90 tablet 1  . blood glucose meter kit and supplies KIT Dispense based on patient and insurance preference. Use two times daily as directed (before breakfast and at bedtime. (FOR ICD-10: E08.42) (Patient not taking: Reported on 11/21/2018) 1 each 2  . rivaroxaban (XARELTO) 20 MG TABS tablet Take  1 tablet (20 mg total) by mouth daily with supper. (Patient not taking: Reported on 11/14/2018) 30 tablet 2  . amLODipine (NORVASC) 5 MG tablet Take 1 tablet (5 mg total) by mouth daily. (Patient not taking: Reported on 11/21/2018) 90 tablet 1   No facility-administered medications prior to visit.     ROS See HPI  Objective:  BP 124/76   Pulse 76   Temp 97.7 F (36.5 C) (Oral)   Ht '6\' 1"'$  (1.854 m)   Wt 163 lb (73.9 kg)   SpO2 98%   BMI 21.51 kg/m   BP Readings from Last 3 Encounters:  11/21/18 124/76  11/14/18 136/80  04/26/18 130/82    Wt Readings from Last 3 Encounters:    11/21/18 163 lb (73.9 kg)  11/14/18 165 lb (74.8 kg)  04/26/18 157 lb (71.2 kg)    Physical Exam Constitutional:      Appearance: Normal appearance.  Cardiovascular:     Rate and Rhythm: Normal rate and regular rhythm.  Pulmonary:     Effort: Pulmonary effort is normal.     Breath sounds: Normal breath sounds.  Musculoskeletal:     Right lower leg: No edema.     Left lower leg: No edema.     Comments: Right BKA. Ambulates with cane  Neurological:     Mental Status: He is alert.  Psychiatric:        Mood and Affect: Mood normal.        Behavior: Behavior normal.     Lab Results  Component Value Date   WBC 12.2 (H) 11/21/2018   HGB 13.5 11/21/2018   HCT 39.7 11/21/2018   PLT 342.0 11/21/2018   GLUCOSE 125 (H) 11/21/2018   CHOL 232 (H) 11/21/2018   TRIG (H) 11/21/2018    1678.0 Triglyceride is over 400; calculations on Lipids are invalid.   HDL 27.40 (L) 11/21/2018   LDLDIRECT 57.0 11/21/2018   LDLCALC UNABLE TO CALCULATE IF TRIGLYCERIDE OVER 400 mg/dL 03/27/2014   ALT 12 11/21/2018   AST 13 11/21/2018   NA 133 (L) 11/21/2018   K 4.3 11/21/2018   CL 100 11/21/2018   CREATININE 1.43 11/21/2018   BUN 37 (H) 11/21/2018   CO2 23 11/21/2018   TSH 2.50 11/21/2018   PSA 1.89 11/21/2018   INR 1.00 09/30/2017   HGBA1C 6.9 (H) 04/26/2018   MICROALBUR 34.2 (H) 04/26/2018    Vas [place] Burnard Bunting With/wo Tbi  Result Date: 11/14/2018 LOWER EXTREMITY DOPPLER STUDY Indications: Claudication, rest pain, and peripheral artery disease. High Risk Factors: Current smoker.  Performing Technologist: Ronal Fear RVS, RCS  Examination Guidelines: A complete evaluation includes at minimum, Doppler waveform signals and systolic blood pressure reading at the level of bilateral brachial, anterior tibial, and posterior tibial arteries, when vessel segments are accessible. Bilateral testing is considered an integral part of a complete examination. Photoelectric Plethysmograph (PPG) waveforms and  toe systolic pressure readings are included as required and additional duplex testing as needed. Limited examinations for reoccurring indications may be performed as noted.  ABI Findings: +--------+------------------+-----+--------+--------+ Right   Rt Pressure (mmHg)IndexWaveformComment  +--------+------------------+-----+--------+--------+ [id number]                                     +--------+------------------+-----+--------+--------+ +---------+------------------+-----+----------+-------+ Left     Lt Pressure (mmHg)IndexWaveform  Comment +---------+------------------+-----+----------+-------+ Brachial 140                                      +---------+------------------+-----+----------+-------+  PTA      44                0.30 monophasic        +---------+------------------+-----+----------+-------+ DP                              absent            +---------+------------------+-----+----------+-------+ Great Toe0                 0.00 Abnormal          +---------+------------------+-----+----------+-------+ +-------+-----------+-----------+------------+------------+ ABI/TBIToday's ABIToday's TBIPrevious ABIPrevious TBI +-------+-----------+-----------+------------+------------+ Right  BKA        BKA        BKA         BKA          +-------+-----------+-----------+------------+------------+ Left   0.30       absent     0.52        0.28         +-------+-----------+-----------+------------+------------+  Summary: Right: BKA. 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  *See table(s) above for measurements and observations.  Electronically signed by Curt Jews MD on 11/14/2018 at 1:14:23 PM.   Final     Assessment & Plan:   Dio was seen today for follow-up.  Diagnoses and all orders for this visit:  Neuropathy -     B12 and Folate Panel  DM (diabetes mellitus) with peripheral vascular  complication ([place]) -     Ambulatory referral to Ophthalmology -     Comprehensive metabolic panel  Mixed hyperlipidemia -     Comprehensive metabolic panel -     Lipid panel -     LDL cholesterol, direct -     AMB Referral to [facility]  Hypertriglyceridemia -     Lipid panel -     LDL cholesterol, direct -     AMB Referral to [facility] -     atorvastatin (LIPITOR) 20 MG tablet; Take 1 tablet (20 mg total) by mouth daily at 6 PM.  Anxiety and depression -     sertraline (ZOLOFT) 100 MG tablet; Take 1 tablet (100 mg total) by mouth daily.  Essential hypertension -     TSH -     CBC w/Diff -     amLODipine (NORVASC) 5 MG tablet; Take 1 tablet (5 mg total) by mouth daily. -     hydrochlorothiazide (HYDRODIURIL) 12.5 MG tablet; Take 1 tablet (12.5 mg total) by mouth daily. -     olmesartan (BENICAR) 40 MG tablet; Take 1 tablet (40 mg total) by mouth daily.  Prostate cancer screening -     PSA  Anemia, unspecified type -     IBC panel -     CBC w/Diff  PAD (peripheral artery disease) (HCC) -     AMB Referral to [facility]  Hyperlipidemia, unspecified hyperlipidemia type -     atorvastatin (LIPITOR) 20 MG tablet; Take 1 tablet (20 mg total) by mouth daily at 6 PM.  Diabetes mellitus due to underlying condition, uncontrolled, with diabetic polyneuropathy, without long-term current use of insulin (HCC) -     metFORMIN (GLUCOPHAGE) 500 MG tablet; Take 1 tablet (500 mg total) by mouth 2 (two) times daily with a meal.   I have changed Leandro Welden's olmesartan. I am also having him maintain his aspirin,  blood glucose meter kit and supplies, rivaroxaban, insulin glargine, Pen Needles, potassium chloride, fenofibrate, amLODipine, sertraline, atorvastatin, hydrochlorothiazide, and metFORMIN.  Meds ordered this encounter  Medications  . amLODipine (NORVASC) 5 MG tablet    Sig: Take 1 tablet (5 mg total) by mouth daily.      Dispense:  90 tablet    Refill:  3    Order Specific Question:   Supervising Provider    Answer:   Lucille Passy [[id number]]  . sertraline (ZOLOFT) 100 MG tablet    Sig: Take 1 tablet (100 mg total) by mouth daily.    Dispense:  90 tablet    Refill:  3    Order Specific Question:   Supervising Provider    Answer:   Lucille Passy [[id number]]  . atorvastatin (LIPITOR) 20 MG tablet    Sig: Take 1 tablet (20 mg total) by mouth daily at 6 PM.    Dispense:  90 tablet    Refill:  1    Order Specific Question:   Supervising Provider    Answer:   Lucille Passy [[id number]]  . hydrochlorothiazide (HYDRODIURIL) 12.5 MG tablet    Sig: Take 1 tablet (12.5 mg total) by mouth daily.    Dispense:  90 tablet    Refill:  1    Order Specific Question:   Supervising Provider    Answer:   Lucille Passy [[id number]]  . metFORMIN (GLUCOPHAGE) 500 MG tablet    Sig: Take 1 tablet (500 mg total) by mouth 2 (two) times daily with a meal.    Dispense:  180 tablet    Refill:  3    Order Specific Question:   Supervising Provider    Answer:   Lucille Passy [[id number]]  . olmesartan (BENICAR) 40 MG tablet    Sig: Take 1 tablet (40 mg total) by mouth daily.    Dispense:  90 tablet    Refill:  3    Order Specific Question:   Supervising Provider    Answer:   Lucille Passy [[id number]]    Problem List Items Addressed This Visit      Cardiovascular and Mediastinum   DM (diabetes mellitus) with peripheral vascular complication (HCC)   Relevant Medications   amLODipine (NORVASC) 5 MG tablet   atorvastatin (LIPITOR) 20 MG tablet   hydrochlorothiazide (HYDRODIURIL) 12.5 MG tablet   metFORMIN (GLUCOPHAGE) 500 MG tablet   olmesartan (BENICAR) 40 MG tablet   Other Relevant Orders   Ambulatory referral to Ophthalmology   Comprehensive metabolic panel (Completed)   Essential hypertension (Chronic)   Relevant Medications   amLODipine (NORVASC) 5 MG tablet   atorvastatin (LIPITOR) 20 MG tablet   hydrochlorothiazide (HYDRODIURIL) 12.5 MG  tablet   olmesartan (BENICAR) 40 MG tablet   Other Relevant Orders   TSH (Completed)   CBC w/Diff (Completed)   PAD (peripheral artery disease) (HCC)   Relevant Medications   amLODipine (NORVASC) 5 MG tablet   atorvastatin (LIPITOR) 20 MG tablet   hydrochlorothiazide (HYDRODIURIL) 12.5 MG tablet   olmesartan (BENICAR) 40 MG tablet   Other Relevant Orders   AMB Referral to [facility]     Nervous and Auditory   Neuropathy - Primary    Persistent, no improvement in gabapentin and cymbalta, minimal improvement with lyrica, was referred to pain clinic but he was not happy with care. Will like to resume lyrica for leg numbness and tingling.      Relevant Orders  B12 and Folate Panel (Completed)     Other   Anxiety and depression   Relevant Medications   sertraline (ZOLOFT) 100 MG tablet   Hyperlipidemia   Relevant Medications   amLODipine (NORVASC) 5 MG tablet   atorvastatin (LIPITOR) 20 MG tablet   hydrochlorothiazide (HYDRODIURIL) 12.5 MG tablet   olmesartan (BENICAR) 40 MG tablet   Other Relevant Orders   Comprehensive metabolic panel (Completed)   Lipid panel (Completed)   LDL cholesterol, direct (Completed)   AMB Referral to [facility]   Hypertriglyceridemia (Chronic)   Relevant Medications   amLODipine (NORVASC) 5 MG tablet   atorvastatin (LIPITOR) 20 MG tablet   hydrochlorothiazide (HYDRODIURIL) 12.5 MG tablet   olmesartan (BENICAR) 40 MG tablet   Other Relevant Orders   Lipid panel (Completed)   LDL cholesterol, direct (Completed)   AMB Referral to [facility]    Other Visit Diagnoses    Prostate cancer screening       Relevant Orders   PSA (Completed)   Anemia, unspecified type       Relevant Orders   IBC panel (Completed)   CBC w/Diff (Completed)   Diabetes mellitus due to underlying condition, uncontrolled, with diabetic polyneuropathy, without long-term current use of insulin (HCC)        Relevant Medications   sertraline (ZOLOFT) 100 MG tablet   atorvastatin (LIPITOR) 20 MG tablet   metFORMIN (GLUCOPHAGE) 500 MG tablet   olmesartan (BENICAR) 40 MG tablet      Follow-up: Return in about 3 months (around 02/20/2019) for DM and HTN, hyperlipidemia (fasting).  Wilfred Lacy, NP

## 2018-11-21 NOTE — Patient Instructions (Addendum)
Abnormal lipid panel: very elevated triglyceride. Entered referral to lipid clinic. Other labs are normal  Let me know if you interested in signing up for [facility] transportation.  You will be contacted to schedule appt with ophthalmology for eye exam.  Cologuard order faxed.  Continue to check glucose once a day (morning, before breakfast).

## 2018-11-22 ENCOUNTER — Encounter: Payer: Self-pay | Admitting: Nurse Practitioner

## 2018-11-22 MED ORDER — HYDROCHLOROTHIAZIDE 12.5 MG PO TABS: 12.5000 mg | ORAL_TABLET | Freq: Every day | ORAL | 1 refills | Status: DC

## 2018-11-22 MED ORDER — OLMESARTAN MEDOXOMIL 40 MG PO TABS: 40.0000 mg | ORAL_TABLET | Freq: Every day | ORAL | 3 refills | Status: DC

## 2018-11-22 MED ORDER — METFORMIN HCL 500 MG PO TABS: 500.0000 mg | ORAL_TABLET | Freq: Two times a day (BID) | ORAL | 3 refills | Status: DC

## 2018-11-22 MED ORDER — ATORVASTATIN CALCIUM 20 MG PO TABS: 20.0000 mg | ORAL_TABLET | Freq: Every day | ORAL | 1 refills | Status: DC

## 2018-11-30 ENCOUNTER — Encounter: Payer: Self-pay | Admitting: Nurse Practitioner

## 2019-01-04 NOTE — Progress Notes (Deleted)
  Referring-Charlotte Lum Nche NP Reason for referral-Hyperlipidemia  HPI: 68 yo male for evaluation of hyperlipidemia at request of Charlotte Lum Nche NP. Echo 4/15 showed normal LV function. Carotid dopplers 4/15 showed 1-39 bilateral stenosis. Angiogram 1/8 showed 90 L RAS, small infrarenal AAA, occluded L SFA. 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 Lipid panel 12/19 showed total 232, triglycerides 1678, HDL 27, LDL 57. TSH 2.5.   Current Outpatient Medications  Medication Sig Dispense Refill  . amLODipine (NORVASC) 5 MG tablet Take 1 tablet (5 mg total) by mouth daily. 90 tablet 3  . aspirin 81 MG tablet Take 81 mg by mouth daily.    . atorvastatin (LIPITOR) 20 MG tablet Take 1 tablet (20 mg total) by mouth daily at 6 PM. 90 tablet 1  . blood glucose meter kit and supplies KIT Dispense based on patient and insurance preference. Use two times daily as directed (before breakfast and at bedtime. (FOR ICD-10: E08.42) (Patient not taking: Reported on 11/21/2018) 1 each 2  . fenofibrate (TRICOR) 145 MG tablet Take 1 tablet (145 mg total) by mouth daily. 90 tablet 1  . hydrochlorothiazide (HYDRODIURIL) 12.5 MG tablet Take 1 tablet (12.5 mg total) by mouth daily. 90 tablet 1  . insulin glargine (LANTUS) 100 unit/mL SOPN Inject 0.1 mLs (10 Units total) into the skin at bedtime. 15 mL 1  . Insulin Pen Needle (PEN NEEDLES) 31G X 6 MM MISC 1 application by Does not apply route at bedtime. 100 each 2  . metFORMIN (GLUCOPHAGE) 500 MG tablet Take 1 tablet (500 mg total) by mouth 2 (two) times daily with a meal. 180 tablet 3  . olmesartan (BENICAR) 40 MG tablet Take 1 tablet (40 mg total) by mouth daily. 90 tablet 3  . potassium chloride (K-DUR,KLOR-CON) 10 MEQ tablet Take 1 tablet (10 mEq total) by mouth daily. 90 tablet 1  . rivaroxaban (XARELTO) 20 MG TABS tablet Take 1 tablet (20 mg total) by mouth daily with supper. (Patient not taking: Reported on 11/14/2018) 30 tablet 2  . sertraline  (ZOLOFT) 100 MG tablet Take 1 tablet (100 mg total) by mouth daily. 90 tablet 3   No current facility-administered medications for this visit.     No Known Allergies  Past Medical History:  Diagnosis Date  . Depression   . Hyperlipidemia   . Hypertension   . PAD (peripheral artery disease) (HCC) ~2007   s/p R BKA for non-healing wound  . Stroke (HCC) 03/2014   MRI: Acute nonhemorrhagic left paracentral pontine infarct. 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  . TIA (transient ischemic attack) 01/2014    Past Surgical History:  Procedure Laterality Date  . BELOW KNEE LEG AMPUTATION Right   . FEMORAL-POPLITEAL BYPASS GRAFT Left 12/17/2016   Procedure: BYPASS LEFT FEMORAL TO BELOW POPLITEAL ARTERY USING PROPATEN GORE GRAFT;  Surgeon: Todd F Early, MD;  Location: [facility] OR;  Service: Vascular;  Laterality: Left;  . FEMORAL-POPLITEAL BYPASS GRAFT Left 09/30/2017   Procedure: LEFT LEG ANGIOGRAM,  THROMBECTOMY, FEM-POPLITEAL BYPASS GRAFT, tHROMBECTOMY PERONEAL ARTERY AND POSTERIOR TIBIAL , ENDARTERECTOMY TIBIAL/PERONEAL TRUNK WITH BOVINE PATCH ANGIOPLASTY.;  Surgeon: Chen,  L, MD;  Location: [facility] OR;  Service: Vascular;  Laterality: Left;  . PERIPHERAL VASCULAR CATHETERIZATION Left 12/14/2016   Procedure: Abdominal Aortogram w/Lower Extremity;  Surgeon: Christopher S Dickson, MD;  Location: [facility] INVASIVE CV LAB;  Service: Cardiovascular;  Laterality: Left;  . TRANSTHORACIC   ECHOCARDIOGRAM  01/2014   To evaluate possible CVA: EF 55-60%. GR 1 DD. No significant valvular lesions    Social History   Socioeconomic History  . Marital status: Single    Spouse name: Not on file  . Number of children: Not on file  . Years of education: Not on file  . Highest education level: Not on file  Occupational History  . Not on file  Social Needs  . Financial resource strain: Not on file  . Food insecurity:    Worry: Not on file      Inability: Not on file  . Transportation needs:    Medical: Not on file    Non-medical: Not on file  Tobacco Use  . Smoking status: Current Some Day Smoker    Packs/day: 0.50    Years: 35.00    Pack years: 17.50    Types: Cigarettes  . Smokeless tobacco: Never Used  . Tobacco comment: zyban vs chantix  Substance and Sexual Activity  . Alcohol use: No  . Drug use: No  . Sexual activity: Not on file  Lifestyle  . Physical activity:    Days per week: Not on file    Minutes per session: Not on file  . Stress: Not on file  Relationships  . Social connections:    Talks on phone: Not on file    Gets together: Not on file    Attends religious service: Not on file    Active member of club or organization: Not on file    Attends meetings of clubs or organizations: Not on file    Relationship status: Not on file  . Intimate partner violence:    Fear of current or ex partner: Not on file    Emotionally abused: Not on file    Physically abused: Not on file    Forced sexual activity: Not on file  Other Topics Concern  . Not on file  Social History Narrative  . Not on file    Family History  Problem Relation Age of Onset  . Hypertension Mother        Does not know history  . Heart disease Mother   . Stroke Mother   . Diabetes Mother   . Hypertension Father   . Heart disease Father   . Stroke Father   . Diabetes Sister   . Hypertension Sister   . Heart disease Brother   . Hypertension Brother     ROS: no fevers or chills, productive cough, hemoptysis, dysphasia, odynophagia, melena, hematochezia, dysuria, hematuria, rash, seizure activity, orthopnea, PND, pedal edema, claudication. Remaining systems are negative.  Physical Exam:   There were no vitals taken for this visit.  General:  Well developed/well nourished in NAD Skin warm/dry Patient not depressed No peripheral clubbing Back-normal HEENT-normal/normal eyelids Neck supple/normal carotid upstroke  bilaterally; no bruits; no JVD; no thyromegaly chest - CTA/ normal expansion CV - RRR/normal S1 and S2; no murmurs, rubs or gallops;  PMI nondisplaced Abdomen -NT/ND, no HSM, no mass, + bowel sounds, no bruit 2+ femoral pulses, no bruits Ext-no edema, chords, 2+ DP Neuro-grossly nonfocal  ECG - personally reviewed  A/P  1  Kirk Ruths, MD

## 2019-01-15 ENCOUNTER — Ambulatory Visit: Payer: Medicare HMO | Admitting: Cardiology

## 2019-01-17 ENCOUNTER — Other Ambulatory Visit: Payer: Self-pay | Admitting: Nurse Practitioner

## 2019-01-17 DIAGNOSIS — E781 Pure hyperglyceridemia: Secondary | ICD-10-CM

## 2019-01-17 DIAGNOSIS — E785 Hyperlipidemia, unspecified: Secondary | ICD-10-CM

## 2019-01-17 DIAGNOSIS — F32A Depression, unspecified: Secondary | ICD-10-CM

## 2019-01-17 DIAGNOSIS — F419 Anxiety disorder, unspecified: Principal | ICD-10-CM

## 2019-01-17 DIAGNOSIS — F329 Major depressive disorder, single episode, unspecified: Secondary | ICD-10-CM

## 2019-01-17 NOTE — Telephone Encounter (Signed)
Copied from [place] [phone]. Topic: Quick Communication - Rx Refill/Question >> Jan 17, 2019  3:24 PM Mcneil, Ja-Kwan wrote: Medication: atorvastatin (LIPITOR) 20 MG tablet and sertraline (ZOLOFT) 100 MG tablet  Has the patient contacted their pharmacy? yes   Preferred Pharmacy (with phone number or street name): [facility] ([address]), [place] [phone] (Phone)  [phone] (Fax)  Agent: Please be advised that RX refills may take up to 3 business days. We ask that you follow-up with your pharmacy.

## 2019-02-20 ENCOUNTER — Encounter: Payer: Self-pay | Admitting: Nurse Practitioner

## 2019-02-20 ENCOUNTER — Ambulatory Visit (INDEPENDENT_AMBULATORY_CARE_PROVIDER_SITE_OTHER): Payer: Medicare HMO | Admitting: Nurse Practitioner

## 2019-02-20 ENCOUNTER — Other Ambulatory Visit: Payer: Self-pay

## 2019-02-20 ENCOUNTER — Telehealth: Payer: Self-pay | Admitting: Nurse Practitioner

## 2019-02-20 VITALS — BP 146/86 | HR 76 | Temp 97.5°F | Ht 73.0 in | Wt 172.6 lb

## 2019-02-20 DIAGNOSIS — F419 Anxiety disorder, unspecified: Secondary | ICD-10-CM | POA: Diagnosis not present

## 2019-02-20 DIAGNOSIS — I10 Essential (primary) hypertension: Secondary | ICD-10-CM | POA: Diagnosis not present

## 2019-02-20 DIAGNOSIS — F329 Major depressive disorder, single episode, unspecified: Secondary | ICD-10-CM

## 2019-02-20 DIAGNOSIS — Z89511 Acquired absence of right leg below knee: Secondary | ICD-10-CM | POA: Diagnosis not present

## 2019-02-20 DIAGNOSIS — IMO0002 Reserved for concepts with insufficient information to code with codable children: Secondary | ICD-10-CM

## 2019-02-20 DIAGNOSIS — E1151 Type 2 diabetes mellitus with diabetic peripheral angiopathy without gangrene: Secondary | ICD-10-CM | POA: Diagnosis not present

## 2019-02-20 DIAGNOSIS — F32A Depression, unspecified: Secondary | ICD-10-CM

## 2019-02-20 DIAGNOSIS — E0865 Diabetes mellitus due to underlying condition with hyperglycemia: Secondary | ICD-10-CM

## 2019-02-20 DIAGNOSIS — E0842 Diabetes mellitus due to underlying condition with diabetic polyneuropathy: Secondary | ICD-10-CM

## 2019-02-20 DIAGNOSIS — L602 Onychogryphosis: Secondary | ICD-10-CM | POA: Diagnosis not present

## 2019-02-20 DIAGNOSIS — E781 Pure hyperglyceridemia: Secondary | ICD-10-CM | POA: Diagnosis not present

## 2019-02-20 DIAGNOSIS — Z720 Tobacco use: Secondary | ICD-10-CM

## 2019-02-20 DIAGNOSIS — Z1211 Encounter for screening for malignant neoplasm of colon: Secondary | ICD-10-CM | POA: Diagnosis not present

## 2019-02-20 DIAGNOSIS — G629 Polyneuropathy, unspecified: Secondary | ICD-10-CM | POA: Diagnosis not present

## 2019-02-20 DIAGNOSIS — Z716 Tobacco abuse counseling: Secondary | ICD-10-CM | POA: Diagnosis not present

## 2019-02-20 LAB — BASIC METABOLIC PANEL
BUN: 18 mg/dL (ref 6–23)
CO2: 26 mEq/L (ref 19–32)
Calcium: 9.3 mg/dL (ref 8.4–10.5)
Chloride: 103 mEq/L (ref 96–112)
Creatinine, Ser: 1.23 mg/dL (ref 0.40–1.50)
GFR: 58.77 mL/min — ABNORMAL LOW (ref >60.00)
Glucose, Bld: 163 mg/dL — ABNORMAL HIGH (ref 70–99)
Potassium: 4.5 mEq/L (ref 3.5–5.1)
Sodium: 136 mEq/L (ref 135–145)

## 2019-02-20 LAB — HEMOGLOBIN A1C: Hgb A1c MFr Bld: 7.2 % — ABNORMAL HIGH (ref 4.6–6.5)

## 2019-02-20 LAB — LIPID PANEL
Cholesterol: 199 mg/dL (ref 0–200)
HDL: 38.5 mg/dL — ABNORMAL LOW (ref >39.00)
NONHDL: 160.22
Total CHOL/HDL Ratio: 5
Triglycerides: 263 mg/dL — ABNORMAL HIGH (ref 0.0–149.0)
VLDL: 52.6 mg/dL — ABNORMAL HIGH (ref 0.0–40.0)

## 2019-02-20 LAB — HEPATIC FUNCTION PANEL
ALT: 20 U/L (ref 0–53)
AST: 20 U/L (ref 0–37)
Albumin: 4.4 g/dL (ref 3.5–5.2)
Alkaline Phosphatase: 56 U/L (ref 39–117)
Bilirubin, Direct: 0.1 mg/dL (ref 0.0–0.3)
Total Bilirubin: 0.4 mg/dL (ref 0.2–1.2)
Total Protein: 7.4 g/dL (ref 6.0–8.3)

## 2019-02-20 LAB — MICROALBUMIN / CREATININE URINE RATIO
Creatinine,U: 44.4 mg/dL
Microalb Creat Ratio: 12.2 mg/g (ref 0.0–30.0)
Microalb, Ur: 5.4 mg/dL — ABNORMAL HIGH (ref 0.0–1.9)

## 2019-02-20 LAB — LDL CHOLESTEROL, DIRECT: Direct LDL: 110 mg/dL

## 2019-02-20 MED ORDER — POTASSIUM CHLORIDE CRYS ER 10 MEQ PO TBCR: 10.0000 meq | EXTENDED_RELEASE_TABLET | Freq: Every day | ORAL | 1 refills | Status: DC

## 2019-02-20 MED ORDER — FENOFIBRATE 145 MG PO TABS: 145.0000 mg | ORAL_TABLET | Freq: Every day | ORAL | 1 refills | Status: DC

## 2019-02-20 MED ORDER — METFORMIN HCL 850 MG PO TABS: 850.0000 mg | ORAL_TABLET | Freq: Two times a day (BID) | ORAL | 1 refills | Status: DC

## 2019-02-20 MED ORDER — SERTRALINE HCL 100 MG PO TABS: 100.0000 mg | ORAL_TABLET | Freq: Every day | ORAL | 3 refills | Status: DC

## 2019-02-20 MED ORDER — VARENICLINE TARTRATE 0.5 MG X 11 & 1 MG X 42 PO MISC: ORAL | 0 refills | Status: DC

## 2019-02-20 MED ORDER — ATORVASTATIN CALCIUM 20 MG PO TABS: 20.0000 mg | ORAL_TABLET | Freq: Every day | ORAL | 1 refills | Status: DC

## 2019-02-20 MED ORDER — AMLODIPINE BESYLATE 5 MG PO TABS: 5.0000 mg | ORAL_TABLET | Freq: Every day | ORAL | 3 refills | Status: DC

## 2019-02-20 MED ORDER — OLMESARTAN MEDOXOMIL-HCTZ 40-12.5 MG PO TABS: 1.0000 | ORAL_TABLET | Freq: Every day | ORAL | 1 refills | Status: DC

## 2019-02-20 NOTE — Telephone Encounter (Signed)
-----   Message from Margy Clarks, LPN sent at 06/04/1600  2:16 PM EDT ----- Regarding: RE: Xarelto continuation? Hi,  I am a clinic LPN and I have not stopped this patients medication. I would suggest you contact Dr Luther Parody office to discuss this matter.  Jordan Arellano ----- Message ----- From: Flossie Buffy, NP Sent: 02/20/2019  11:24 AM EDT To: Margy Clarks, LPN Subject: Xarelto continuation?                          Good Morning, Jordan Arellano states xarelto was discontinued by you but I do not see any documentation on that. Please clarify if he is to continue xarelto and when he is to f/up with you.  Thank you Wilfred Lacy.

## 2019-02-20 NOTE — Telephone Encounter (Signed)
Pt is aware.  

## 2019-02-20 NOTE — Progress Notes (Signed)
Subjective:  Patient ID: Jordan Arellano, male    DOB: 15-Dec-1950  Age: 68 y.o. MRN: [id number]  CC: Follow-up (3 mo fu/fasting/out of K+,hydochlorothiazide and xerelto/ FYI--pt havent take his med today,did cologuard over a yer ago. )  HPI HTN: Stable, elevated today due to skipped medication this AM. Current use of amlodipine, HCTZ, and benicar  BP Readings from Last 3 Encounters:  02/20/19 (!) 146/86  11/21/18 124/76  11/14/18 136/80   DM: Uncontrolled with metformin and lantus Positive urine microalbumin. Did not schedule eye exam as instructed. 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 LDL not at goal, did not make appt with lipid clinic as instructed.  PAD and Hypertriglyceridemia: Right AKA and s/p left fem-pop bypass 2018 Current use of fenofibrate and atorvastatin Reports xarelto was discontinue by Dr. Luther Parody nurse. Has not taken xarelto in 39monthLast seen in office 11/2018. He is unaware of next appt   Tobacco use: He is interested in quitting. Reports he Quit in past for 335monththen resume, did not use anything at the time. He currently smokes 1/2ppd x 3y74yrstarted tobacco use since age 85,60mokes filtered, non menthol. Does not use smokeless tobacco. Denies any ETOH  Or illicit drug use.  Anxiety and Depression: Stable with zoloft. [facility] 02/20/2019 11/21/2018 04/26/2018  Decreased Interest 0 0 0  Down, Depressed, Hopeless 0 0 0  PHQ - 2 Score 0 0 0  Altered sleeping - 0 -  Tired, decreased energy - 0 -  Change in appetite - 0 -  Feeling bad or failure about yourself  - 0 -  Trouble concentrating - 0 -  Moving slowly or fidgety/restless - 0 -  Suicidal thoughts - 0 -  PHQ-9 Score - 0 -   Reviewed past Medical, Social and Family history today.  Outpatient Medications Prior to Visit  Medication Sig Dispense Refill  . aspirin 81 MG tablet Take 81 mg by mouth daily.    . rivaroxaban (XARELTO) 20 MG TABS tablet Take 1 tablet (20 mg total)  by mouth daily with supper. 30 tablet 2  . amLODipine (NORVASC) 5 MG tablet Take 1 tablet (5 mg total) by mouth daily. 90 tablet 3  . atorvastatin (LIPITOR) 20 MG tablet Take 1 tablet (20 mg total) by mouth daily at 6 PM. 90 tablet 1  . fenofibrate (TRICOR) 145 MG tablet Take 1 tablet (145 mg total) by mouth daily. 90 tablet 1  . hydrochlorothiazide (HYDRODIURIL) 12.5 MG tablet Take 1 tablet (12.5 mg total) by mouth daily. 90 tablet 1  . insulin glargine (LANTUS) 100 unit/mL SOPN Inject 0.1 mLs (10 Units total) into the skin at bedtime. 15 mL 1  . Insulin Pen Needle (PEN NEEDLES) 31G X 6 MM MISC 1 application by Does not apply route at bedtime. 100 each 2  . metFORMIN (GLUCOPHAGE) 500 MG tablet Take 1 tablet (500 mg total) by mouth 2 (two) times daily with a meal. 180 tablet 3  . olmesartan (BENICAR) 40 MG tablet Take 1 tablet (40 mg total) by mouth daily. 90 tablet 3  . potassium chloride (K-DUR,KLOR-CON) 10 MEQ tablet Take 1 tablet (10 mEq total) by mouth daily. 90 tablet 1  . sertraline (ZOLOFT) 100 MG tablet Take 1 tablet (100 mg total) by mouth daily. 90 tablet 3  . blood glucose meter kit and supplies KIT Dispense based on patient and insurance preference. Use two times daily as directed (before breakfast and at bedtime. (FOR ICD-10: E08.42) (  Patient not taking: Reported on 11/21/2018) 1 each 2   No facility-administered medications prior to visit.     ROS See HPI  Objective:  BP (!) 146/86   Pulse 76   Temp (!) 97.5 F (36.4 C) (Oral)   Ht '6\' 1"'$  (1.854 m)   Wt 172 lb 9.6 oz (78.3 kg)   SpO2 99%   BMI 22.77 kg/m   BP Readings from Last 3 Encounters:  02/20/19 (!) 146/86  11/21/18 124/76  11/14/18 136/80    Wt Readings from Last 3 Encounters:  02/20/19 172 lb 9.6 oz (78.3 kg)  11/21/18 163 lb (73.9 kg)  11/14/18 165 lb (74.8 kg)    Physical Exam Cardiovascular:     Rate and Rhythm: Normal rate and regular rhythm.     Pulses:          Dorsalis pedis pulses are 1+ on  the left side.       Posterior tibial pulses are 1+ on the left side.     Heart sounds: Normal heart sounds.  Pulmonary:     Effort: Pulmonary effort is normal.     Breath sounds: Normal breath sounds.  Musculoskeletal:     Left foot: Normal range of motion. Bunion present. No deformity.  Feet:     Right foot:     Amputation: Right leg is amputated above knee.     Left foot:     Skin integrity: Callus and dry skin present.     Toenail Condition: Left toenails are abnormally thick and long.  Neurological:     Mental Status: He is alert and oriented to person, place, and time.     Lab Results  Component Value Date   WBC 12.2 (H) 11/21/2018   HGB 13.5 11/21/2018   HCT 39.7 11/21/2018   PLT 342.0 11/21/2018   GLUCOSE 163 (H) 02/20/2019   CHOL 199 02/20/2019   TRIG 263.0 (H) 02/20/2019   HDL 38.50 (L) 02/20/2019   LDLDIRECT 110.0 02/20/2019   LDLCALC UNABLE TO CALCULATE IF TRIGLYCERIDE OVER 400 mg/dL 03/27/2014   ALT 20 02/20/2019   AST 20 02/20/2019   NA 136 02/20/2019   K 4.5 02/20/2019   CL 103 02/20/2019   CREATININE 1.23 02/20/2019   BUN 18 02/20/2019   CO2 26 02/20/2019   TSH 2.50 11/21/2018   PSA 1.89 11/21/2018   INR 1.00 09/30/2017   HGBA1C 7.2 (H) 02/20/2019   MICROALBUR 5.4 (H) 02/20/2019    Vas [place]ard Bunting With/wo Tbi  Result Date: 11/14/2018 LOWER EXTREMITY DOPPLER STUDY Indications: Claudication, rest pain, and peripheral artery disease. High Risk Factors: Current smoker.  Performing Technologist: Ronal Fear RVS, RCS  Examination Guidelines: A complete evaluation includes at minimum, Doppler waveform signals and systolic blood pressure reading at the level of bilateral brachial, anterior tibial, and posterior tibial arteries, when vessel segments are accessible. Bilateral testing is considered an integral part of a complete examination. Photoelectric Plethysmograph (PPG) waveforms and toe systolic pressure readings are included as required and additional  duplex testing as needed. Limited examinations for reoccurring indications may be performed as noted.  ABI Findings: +--------+------------------+-----+--------+--------+ Right   Rt Pressure (mmHg)IndexWaveformComment  +--------+------------------+-----+--------+--------+ [id number]                                     +--------+------------------+-----+--------+--------+ +---------+------------------+-----+----------+-------+ Left     Lt Pressure (mmHg)IndexWaveform  Comment +---------+------------------+-----+----------+-------+ Brachial 140                                      +---------+------------------+-----+----------+-------+  PTA      44                0.30 monophasic        +---------+------------------+-----+----------+-------+ DP                              absent            +---------+------------------+-----+----------+-------+ Great Toe0                 0.00 Abnormal          +---------+------------------+-----+----------+-------+ +-------+-----------+-----------+------------+------------+ ABI/TBIToday's ABIToday's TBIPrevious ABIPrevious TBI +-------+-----------+-----------+------------+------------+ Right  BKA        BKA        BKA         BKA          +-------+-----------+-----------+------------+------------+ Left   0.30       absent     0.52        0.28         +-------+-----------+-----------+------------+------------+  Summary: Right: BKA. 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  *See table(s) above for measurements and observations.  Electronically signed by Curt Jews MD on 11/14/2018 at 1:14:23 PM.   Final     Assessment & Plan:   Jordan Arellano was seen today for follow-up.  Diagnoses and all orders for this visit:  Essential hypertension -     Basic metabolic panel -     amLODipine (NORVASC) 5 MG tablet; Take 1 tablet (5 mg total) by mouth daily. -     potassium chloride  (K-DUR,KLOR-CON) 10 MEQ tablet; Take 1 tablet (10 mEq total) by mouth daily. -     olmesartan-hydrochlorothiazide (BENICAR HCT) 40-12.5 MG tablet; Take 1 tablet by mouth daily.  Neuropathy  DM (diabetes mellitus) with peripheral vascular complication (HCC) -     Hemoglobin A1c -     Basic metabolic panel -     Microalbumin / creatinine urine ratio  Hypertriglyceridemia -     Hepatic function panel -     Lipid panel -     atorvastatin (LIPITOR) 20 MG tablet; Take 1 tablet (20 mg total) by mouth daily at 6 PM. -     fenofibrate (TRICOR) 145 MG tablet; Take 1 tablet (145 mg total) by mouth daily.  Anxiety and depression -     sertraline (ZOLOFT) 100 MG tablet; Take 1 tablet (100 mg total) by mouth daily.  Colon cancer screening -     Cologuard; Future  Tobacco abuse -     varenicline (CHANTIX PAK) 0.5 MG X 11 & 1 MG X 42 tablet; Take one 0.5 mg tablet by mouth once daily for 3 days, then increase to one 0.5 mg tablet twice daily for 4 days, then increase to one 1 mg tablet twice daily.  Encounter for smoking cessation counseling -     varenicline (CHANTIX PAK) 0.5 MG X 11 & 1 MG X 42 tablet; Take one 0.5 mg tablet by mouth once daily for 3 days, then increase to one 0.5 mg tablet twice daily for 4 days, then increase to one 1 mg tablet twice daily.  Hypertrophic toenail -     Ambulatory referral to Podiatry  Diabetes mellitus due to underlying condition, uncontrolled, with diabetic polyneuropathy, without long-term current use of insulin (HCC) -     metFORMIN (GLUCOPHAGE) 850 MG tablet; Take 1 tablet (850 mg total) by mouth  2 (two) times daily with a meal.  Other orders -     LDL cholesterol, direct   I have discontinued Jordan Arellano's insulin glargine, Pen Needles, hydrochlorothiazide, and olmesartan. I have also changed his metFORMIN. Additionally, I am having him start on varenicline and olmesartan-hydrochlorothiazide. Lastly, I am having him maintain his aspirin, blood glucose  meter kit and supplies, rivaroxaban, atorvastatin, amLODipine, fenofibrate, potassium chloride, and sertraline.  Meds ordered this encounter  Medications  . varenicline (CHANTIX PAK) 0.5 MG X 11 & 1 MG X 42 tablet    Sig: Take one 0.5 mg tablet by mouth once daily for 3 days, then increase to one 0.5 mg tablet twice daily for 4 days, then increase to one 1 mg tablet twice daily.    Dispense:  53 tablet    Refill:  0    Order Specific Question:   Supervising Provider    Answer:   MATTHEWS, CODY [[id number]]  . atorvastatin (LIPITOR) 20 MG tablet    Sig: Take 1 tablet (20 mg total) by mouth daily at 6 PM.    Dispense:  90 tablet    Refill:  1    Order Specific Question:   Supervising Provider    Answer:   MATTHEWS, CODY [[id number]]  . amLODipine (NORVASC) 5 MG tablet    Sig: Take 1 tablet (5 mg total) by mouth daily.    Dispense:  90 tablet    Refill:  3    Order Specific Question:   Supervising Provider    Answer:   MATTHEWS, CODY [[id number]]  . fenofibrate (TRICOR) 145 MG tablet    Sig: Take 1 tablet (145 mg total) by mouth daily.    Dispense:  90 tablet    Refill:  1    90 day    Order Specific Question:   Supervising Provider    Answer:   MATTHEWS, CODY [[id number]]  . metFORMIN (GLUCOPHAGE) 850 MG tablet    Sig: Take 1 tablet (850 mg total) by mouth 2 (two) times daily with a meal.    Dispense:  180 tablet    Refill:  1    Order Specific Question:   Supervising Provider    Answer:   MATTHEWS, CODY [[id number]]  . potassium chloride (K-DUR,KLOR-CON) 10 MEQ tablet    Sig: Take 1 tablet (10 mEq total) by mouth daily.    Dispense:  90 tablet    Refill:  1    Order Specific Question:   Supervising Provider    Answer:   MATTHEWS, CODY [[id number]]  . sertraline (ZOLOFT) 100 MG tablet    Sig: Take 1 tablet (100 mg total) by mouth daily.    Dispense:  90 tablet    Refill:  3    Order Specific Question:   Supervising Provider    Answer:   MATTHEWS, CODY [[id number]]  . olmesartan-hydrochlorothiazide (BENICAR HCT)  40-12.5 MG tablet    Sig: Take 1 tablet by mouth daily.    Dispense:  90 tablet    Refill:  1    Discontinue benicar and HCTZ to replace with benicar HCT    Order Specific Question:   Supervising Provider    Answer:   Zigmund Daniel, CODY [[id number]]    Problem List Items Addressed This Visit      Cardiovascular and Mediastinum   DM (diabetes mellitus) with peripheral vascular complication (HCC)   Relevant Medications   atorvastatin (LIPITOR) 20 MG tablet   amLODipine (NORVASC) 5 MG tablet   fenofibrate (  TRICOR) 145 MG tablet   metFORMIN (GLUCOPHAGE) 850 MG tablet   olmesartan-hydrochlorothiazide (BENICAR HCT) 40-12.5 MG tablet   Other Relevant Orders   Hemoglobin A1c (Completed)   Basic metabolic panel (Completed)   Microalbumin / creatinine urine ratio (Completed)   Essential hypertension - Primary (Chronic)   Relevant Medications   atorvastatin (LIPITOR) 20 MG tablet   amLODipine (NORVASC) 5 MG tablet   fenofibrate (TRICOR) 145 MG tablet   potassium chloride (K-DUR,KLOR-CON) 10 MEQ tablet   olmesartan-hydrochlorothiazide (BENICAR HCT) 40-12.5 MG tablet   Other Relevant Orders   Basic metabolic panel (Completed)     Nervous and Auditory   Neuropathy     Other   Anxiety and depression   Relevant Medications   sertraline (ZOLOFT) 100 MG tablet   Hypertriglyceridemia (Chronic)   Relevant Medications   atorvastatin (LIPITOR) 20 MG tablet   amLODipine (NORVASC) 5 MG tablet   fenofibrate (TRICOR) 145 MG tablet   olmesartan-hydrochlorothiazide (BENICAR HCT) 40-12.5 MG tablet   Other Relevant Orders   Hepatic function panel (Completed)   Lipid panel (Completed)   Tobacco abuse (Chronic)   Relevant Medications   varenicline (CHANTIX PAK) 0.5 MG X 11 & 1 MG X 42 tablet    Other Visit Diagnoses    Colon cancer screening       Relevant Orders   Cologuard   Encounter for smoking cessation counseling       Relevant Medications   varenicline (CHANTIX PAK) 0.5 MG X 11 & 1 MG X 42  tablet   Hypertrophic toenail       Relevant Orders   Ambulatory referral to Podiatry   Diabetes mellitus due to underlying condition, uncontrolled, with diabetic polyneuropathy, without long-term current use of insulin (HCC)       Relevant Medications   varenicline (CHANTIX PAK) 0.5 MG X 11 & 1 MG X 42 tablet   atorvastatin (LIPITOR) 20 MG tablet   metFORMIN (GLUCOPHAGE) 850 MG tablet   sertraline (ZOLOFT) 100 MG tablet   olmesartan-hydrochlorothiazide (BENICAR HCT) 40-12.5 MG tablet       Follow-up: Return in about 4 weeks (around 03/20/2019) for tobacco use.  Wilfred Lacy, NP

## 2019-02-20 NOTE — Patient Instructions (Addendum)
Please inquire from [facility] about mail delivery service for edications.  Will collaborate with vascular about continuation of xarelto.  You will be contacted about cologuard test.  No change in HgbA1c and positive urine microalbumin. Continue metformin (dose increased), stop tantus Abnormal lipid panel with some improvement in triglyceride (1678 to 263), and HDL (27 to 38). Continue atorvastatin and fenofibrate. F/up in 82months  Chantix use: 1) you can choose a quit date and start this medicine 1 week before the quit date, or, 2) you can start taking this medicine before you choose a quit date, and then pick a quit date between day 8 and 35 days of treatment, or, 3) if you are not sure that you are able or willing to quit smoking right away, start taking this medicine and slowly decrease the amount you smoke as directed by your health care professional with the goal of being cigarette-free by week 12 of treatment. Stick to your plan; ask about support groups or other ways to help you remain cigarette-free. If you are motivated to quit smoking and did not succeed during a previous attempt with this medicine for reasons other than side effects, or if you returned to smoking after this treatment, speak with your health care professional about whether another course of this medicine may be right for you.   Varenicline oral tablets What is this medicine? VARENICLINE (var EN i kleen) is used to help people quit smoking. It is used with a patient support program recommended by your physician. This medicine may be used for other purposes; ask your health care provider or pharmacist if you have questions. COMMON BRAND NAME(S): Chantix What should I tell my health care provider before I take this medicine? They need to know if you have any of these conditions: -heart disease -if you often drink alcohol -kidney disease -mental illness -on hemodialysis -seizures -history of stroke -suicidal  thoughts, plans, or attempt; a previous suicide attempt by you or a family member -an unusual or allergic reaction to varenicline, other medicines, foods, dyes, or preservatives -pregnant or trying to get pregnant -breast-feeding How should I use this medicine? Take this medicine by mouth after eating. Take with a full glass of water. Follow the directions on the prescription label. Take your doses at regular intervals. Do not take your medicine more often than directed. There are 3 ways you can use this medicine to help you quit smoking; talk to your health care professional to decide which plan is right for you: 1) you can choose a quit date and start this medicine 1 week before the quit date, or, 2) you can start taking this medicine before you choose a quit date, and then pick a quit date between day 8 and 35 days of treatment, or, 3) if you are not sure that you are able or willing to quit smoking right away, start taking this medicine and slowly decrease the amount you smoke as directed by your health care professional with the goal of being cigarette-free by week 12 of treatment. Stick to your plan; ask about support groups or other ways to help you remain cigarette-free. If you are motivated to quit smoking and did not succeed during a previous attempt with this medicine for reasons other than side effects, or if you returned to smoking after this treatment, speak with your health care professional about whether another course of this medicine may be right for you. A special MedGuide will be given to you by the pharmacist  with each prescription and refill. Be sure to read this information carefully each time. Talk to your pediatrician regarding the use of this medicine in children. This medicine is not approved for use in children. Overdosage: If you think you have taken too much of this medicine contact a poison control center or emergency room at once. NOTE: This medicine is only for you. Do  not share this medicine with others. What if I miss a dose? If you miss a dose, take it as soon as you can. If it is almost time for your next dose, take only that dose. Do not take double or extra doses. What may interact with this medicine? -alcohol -insulin -other medicines used to help people quit smoking -theophylline -warfarin This list may not describe all possible interactions. Give your health care provider a list of all the medicines, herbs, non-prescription drugs, or dietary supplements you use. Also tell them if you smoke, drink alcohol, or use illegal drugs. Some items may interact with your medicine. What should I watch for while using this medicine? It is okay if you do not succeed at your attempt to quit and have a cigarette. You can still continue your quit attempt and keep using this medicine as directed. Just throw away your cigarettes and get back to your quit plan. Talk to your health care provider before using other treatments to quit smoking. Using this medicine with other treatments to quit smoking may increase the risk for side effects compared to using a treatment alone. You may get drowsy or dizzy. Do not drive, use machinery, or do anything that needs mental alertness until you know how this medicine affects you. Do not stand or sit up quickly, especially if you are an older patient. This reduces the risk of dizzy or fainting spells. Decrease the number of alcoholic beverages that you drink during treatment with this medicine until you know if this medicine affects your ability to tolerate alcohol. Some people have experienced increased drunkenness (intoxication), unusual or sometimes aggressive behavior, or no memory of things that have happened (amnesia) during treatment with this medicine. Sleepwalking can happen during treatment with this medicine, and can sometimes lead to behavior that is harmful to you, other people, or property. Stop taking this medicine and tell  your doctor if you start sleepwalking or have other unusual sleep-related activity. After taking this medicine, you may get up out of bed and do an activity that you do not know you are doing. The next morning, you may have no memory of this. Activities include driving a car ("sleep-driving"), making and eating food, talking on the phone, sexual activity, and sleep-walking. Serious injuries have occurred. Stop the medicine and call your doctor right away if you find out you have done any of these activities. Do not take this medicine if you have used alcohol that evening. Do not take it if you have taken another medicine for sleep. The risk of doing these sleep-related activities is higher. Patients and their families should watch out for new or worsening depression or thoughts of suicide. Also watch out for sudden changes in feelings such as feeling anxious, agitated, panicky, irritable, hostile, aggressive, impulsive, severely restless, overly excited and hyperactive, or not being able to sleep. If this happens, call your health care professional. If you have diabetes and you quit smoking, the effects of insulin may be increased and you may need to reduce your insulin dose. Check with your doctor or health care professional about how  you should adjust your insulin dose. What side effects may I notice from receiving this medicine? Side effects that you should report to your doctor or health care professional as soon as possible: -allergic reactions like skin rash, itching or hives, swelling of the face, lips, tongue, or throat -acting aggressive, being angry or violent, or acting on dangerous impulses -breathing problems -changes in emotions or moods -chest pain or chest tightness -feeling faint or lightheaded, falls -hallucination, loss of contact with reality -mouth sores -redness, blistering, peeling or loosening of the skin, including inside the mouth -signs and symptoms of a stroke like changes  in vision; confusion; trouble speaking or understanding; severe headaches; sudden numbness or weakness of the face, arm or leg; trouble walking; dizziness; loss of balance or coordination -seizures -sleepwalking -suicidal thoughts or other mood changes Side effects that usually do not require medical attention (report to your doctor or health care professional if they continue or are bothersome): -constipation -gas -headache -nausea, vomiting -strange dreams -trouble sleeping This list may not describe all possible side effects. Call your doctor for medical advice about side effects. You may report side effects to FDA at 1-800-FDA-1088. Where should I keep my medicine? Keep out of the reach of children. Store at room temperature between 15 and 30 degrees C (59 and 86 degrees F). Throw away any unused medicine after the expiration date. NOTE: This sheet is a summary. It may not cover all possible information. If you have questions about this medicine, talk to your doctor, pharmacist, or health care provider.  2019 Elsevier/Gold Standard (2018-05-19 12:48:08)

## 2019-03-05 DIAGNOSIS — Z1212 Encounter for screening for malignant neoplasm of rectum: Secondary | ICD-10-CM | POA: Diagnosis not present

## 2019-03-05 DIAGNOSIS — Z1211 Encounter for screening for malignant neoplasm of colon: Secondary | ICD-10-CM | POA: Diagnosis not present

## 2019-03-05 LAB — COLOGUARD: Cologuard: NEGATIVE

## 2019-03-09 ENCOUNTER — Encounter: Payer: Self-pay | Admitting: Nurse Practitioner

## 2019-03-09 NOTE — Progress Notes (Signed)
Abstracted result and sent to scan  

## 2019-08-23 ENCOUNTER — Other Ambulatory Visit: Payer: Self-pay | Admitting: Nurse Practitioner

## 2019-08-23 DIAGNOSIS — I10 Essential (primary) hypertension: Secondary | ICD-10-CM

## 2019-08-27 ENCOUNTER — Other Ambulatory Visit: Payer: Self-pay | Admitting: Nurse Practitioner

## 2019-08-27 DIAGNOSIS — I10 Essential (primary) hypertension: Secondary | ICD-10-CM

## 2019-08-28 ENCOUNTER — Ambulatory Visit: Payer: Medicare HMO | Admitting: Nurse Practitioner

## 2019-09-10 ENCOUNTER — Other Ambulatory Visit: Payer: Self-pay | Admitting: Nurse Practitioner

## 2019-09-10 DIAGNOSIS — IMO0002 Reserved for concepts with insufficient information to code with codable children: Secondary | ICD-10-CM

## 2019-09-10 DIAGNOSIS — E0842 Diabetes mellitus due to underlying condition with diabetic polyneuropathy: Secondary | ICD-10-CM

## 2019-09-12 ENCOUNTER — Other Ambulatory Visit: Payer: Self-pay | Admitting: Nurse Practitioner

## 2019-09-12 ENCOUNTER — Other Ambulatory Visit: Payer: Self-pay

## 2019-09-12 DIAGNOSIS — IMO0002 Reserved for concepts with insufficient information to code with codable children: Secondary | ICD-10-CM

## 2019-09-12 DIAGNOSIS — I10 Essential (primary) hypertension: Secondary | ICD-10-CM

## 2019-09-12 DIAGNOSIS — E0842 Diabetes mellitus due to underlying condition with diabetic polyneuropathy: Secondary | ICD-10-CM

## 2019-09-12 MED ORDER — OLMESARTAN MEDOXOMIL-HCTZ 40-12.5 MG PO TABS: 1.0000 | ORAL_TABLET | Freq: Every day | ORAL | 0 refills | Status: DC

## 2019-09-12 MED ORDER — POTASSIUM CHLORIDE CRYS ER 10 MEQ PO TBCR: 10.0000 meq | EXTENDED_RELEASE_TABLET | Freq: Every day | ORAL | 0 refills | Status: DC

## 2019-09-12 MED ORDER — METFORMIN HCL 850 MG PO TABS: 850.0000 mg | ORAL_TABLET | Freq: Two times a day (BID) | ORAL | 0 refills | Status: DC

## 2019-09-18 ENCOUNTER — Telehealth: Payer: Self-pay | Admitting: Nurse Practitioner

## 2019-09-18 DIAGNOSIS — I10 Essential (primary) hypertension: Secondary | ICD-10-CM

## 2019-09-18 DIAGNOSIS — E781 Pure hyperglyceridemia: Secondary | ICD-10-CM

## 2019-09-18 DIAGNOSIS — F32A Depression, unspecified: Secondary | ICD-10-CM

## 2019-09-18 DIAGNOSIS — F419 Anxiety disorder, unspecified: Secondary | ICD-10-CM

## 2019-09-18 DIAGNOSIS — F329 Major depressive disorder, single episode, unspecified: Secondary | ICD-10-CM

## 2019-09-18 MED ORDER — ATORVASTATIN CALCIUM 20 MG PO TABS: 20.0000 mg | ORAL_TABLET | Freq: Every day | ORAL | 0 refills | Status: DC

## 2019-09-18 MED ORDER — AMLODIPINE BESYLATE 5 MG PO TABS: 5.0000 mg | ORAL_TABLET | Freq: Every day | ORAL | 0 refills | Status: DC

## 2019-09-18 MED ORDER — SERTRALINE HCL 100 MG PO TABS: 100.0000 mg | ORAL_TABLET | Freq: Every day | ORAL | 0 refills | Status: DC

## 2019-09-18 MED ORDER — FENOFIBRATE 145 MG PO TABS: 145.0000 mg | ORAL_TABLET | Freq: Every day | ORAL | 0 refills | Status: DC

## 2019-09-18 NOTE — Telephone Encounter (Signed)
Received rx refill request from [facility],. Cancel rx from [place] and resend to [facility] [employer].   I called pt multiple times but never return my call.

## 2019-10-30 ENCOUNTER — Encounter: Payer: Self-pay | Admitting: Nurse Practitioner

## 2019-10-30 ENCOUNTER — Other Ambulatory Visit: Payer: Self-pay

## 2019-10-30 ENCOUNTER — Ambulatory Visit (INDEPENDENT_AMBULATORY_CARE_PROVIDER_SITE_OTHER): Payer: Medicare HMO | Admitting: Nurse Practitioner

## 2019-10-30 VITALS — BP 130/78 | HR 70 | Temp 96.4°F | Ht 73.0 in | Wt 166.6 lb

## 2019-10-30 DIAGNOSIS — Z23 Encounter for immunization: Secondary | ICD-10-CM | POA: Diagnosis not present

## 2019-10-30 DIAGNOSIS — I10 Essential (primary) hypertension: Secondary | ICD-10-CM | POA: Diagnosis not present

## 2019-10-30 DIAGNOSIS — N1831 Chronic kidney disease, stage 3a: Secondary | ICD-10-CM | POA: Diagnosis not present

## 2019-10-30 DIAGNOSIS — E1142 Type 2 diabetes mellitus with diabetic polyneuropathy: Secondary | ICD-10-CM

## 2019-10-30 DIAGNOSIS — E781 Pure hyperglyceridemia: Secondary | ICD-10-CM | POA: Diagnosis not present

## 2019-10-30 DIAGNOSIS — L602 Onychogryphosis: Secondary | ICD-10-CM | POA: Diagnosis not present

## 2019-10-30 DIAGNOSIS — F3342 Major depressive disorder, recurrent, in full remission: Secondary | ICD-10-CM

## 2019-10-30 LAB — LIPID PANEL
Cholesterol: 177 mg/dL (ref 0–200)
HDL: 29.6 mg/dL — ABNORMAL LOW (ref >39.00)
NonHDL: 147.81
Total CHOL/HDL Ratio: 6
Triglycerides: 357 mg/dL — ABNORMAL HIGH (ref 0.0–149.0)
VLDL: 71.4 mg/dL — ABNORMAL HIGH (ref 0.0–40.0)

## 2019-10-30 LAB — BASIC METABOLIC PANEL
BUN: 35 mg/dL — ABNORMAL HIGH (ref 6–23)
CO2: 24 mEq/L (ref 19–32)
Calcium: 9.2 mg/dL (ref 8.4–10.5)
Chloride: 105 mEq/L (ref 96–112)
Creatinine, Ser: 1.53 mg/dL — ABNORMAL HIGH (ref 0.40–1.50)
GFR: 45.59 mL/min — ABNORMAL LOW (ref >60.00)
Glucose, Bld: 145 mg/dL — ABNORMAL HIGH (ref 70–99)
Potassium: 4.5 mEq/L (ref 3.5–5.1)
Sodium: 136 mEq/L (ref 135–145)

## 2019-10-30 LAB — HEMOGLOBIN A1C: Hgb A1c MFr Bld: 6.8 % — ABNORMAL HIGH (ref 4.6–6.5)

## 2019-10-30 LAB — LDL CHOLESTEROL, DIRECT: Direct LDL: 95 mg/dL

## 2019-10-30 MED ORDER — AMLODIPINE BESYLATE 5 MG PO TABS: 5.0000 mg | ORAL_TABLET | Freq: Every day | ORAL | 3 refills | Status: DC

## 2019-10-30 MED ORDER — ATORVASTATIN CALCIUM 20 MG PO TABS: 20.0000 mg | ORAL_TABLET | Freq: Every day | ORAL | 1 refills | Status: DC

## 2019-10-30 MED ORDER — FENOFIBRATE 145 MG PO TABS: 145.0000 mg | ORAL_TABLET | Freq: Every day | ORAL | 1 refills | Status: DC

## 2019-10-30 MED ORDER — METFORMIN HCL 850 MG PO TABS: 850.0000 mg | ORAL_TABLET | Freq: Two times a day (BID) | ORAL | 1 refills | Status: DC

## 2019-10-30 MED ORDER — SERTRALINE HCL 100 MG PO TABS: 100.0000 mg | ORAL_TABLET | Freq: Every day | ORAL | 3 refills | Status: DC

## 2019-10-30 MED ORDER — OLMESARTAN MEDOXOMIL 40 MG PO TABS: 40.0000 mg | ORAL_TABLET | Freq: Every day | ORAL | 1 refills | Status: DC

## 2019-10-30 NOTE — Progress Notes (Signed)
Subjective:  Patient ID: Jordan Arellano, male    DOB: Nov 03, 1951  Age: 68 y.o. MRN: [id number]  CC: Follow-up (6 mo follow )  HPI DM: Last Hgba1c of 7.2, current use of metformin. 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 Positive urine microalbumin, current use of ARB. LDL and triglyceride not at goal, current use of atorvastatin and fenofibrate. Needs referral to ophthalmology and podiatry. BP at goal with use of amlodipine, and Benicar/HCTZ BP Readings from Last 3 Encounters:  10/30/19 130/78  02/20/19 (!) 146/86  11/21/18 124/76   Depression: Stable mood with zoloft. Depression screen [facility] 2/9 10/30/2019 02/20/2019 11/21/2018  Decreased Interest 0 0 0  Down, Depressed, Hopeless 0 0 0  PHQ - 2 Score 0 0 0  Altered sleeping 0 - 0  Tired, decreased energy 0 - 0  Change in appetite 0 - 0  Feeling bad or failure about yourself  0 - 0  Trouble concentrating 0 - 0  Moving slowly or fidgety/restless 0 - 0  Suicidal thoughts 0 - 0  PHQ-9 Score 0 - 0   GAD 7 : Generalized Anxiety Score 10/30/2019 11/21/2018  Nervous, Anxious, on Edge 0 0  Control/stop worrying 0 0  Worry too much - different things 0 0  Trouble relaxing 0 0  Restless 0 0  Easily annoyed or irritable 0 0  Afraid - awful might happen 0 0  Total GAD 7 Score 0 0   Tobacco use: continuous use despite his medical history, reports he is cutting down slowly, he is no interested in use of patch or gum or chantix or zyban at this time.  Reviewed past Medical, Social and Family history today.  Outpatient Medications Prior to Visit  Medication Sig Dispense Refill  . aspirin 81 MG tablet Take 81 mg by mouth daily.    . blood glucose meter kit and supplies KIT Dispense based on patient and insurance preference. Use two times daily as directed (before breakfast and at bedtime. (FOR ICD-10: E08.42) 1 each 2  . amLODipine (NORVASC) 5 MG tablet Take 1 tablet (5 mg total) by mouth daily. 90 tablet 0  . atorvastatin  (LIPITOR) 20 MG tablet Take 1 tablet (20 mg total) by mouth daily at 6 PM. 90 tablet 0  . fenofibrate (TRICOR) 145 MG tablet Take 1 tablet (145 mg total) by mouth daily. 90 tablet 0  . metFORMIN (GLUCOPHAGE) 850 MG tablet Take 1 tablet (850 mg total) by mouth 2 (two) times daily with a meal. Need office visit for additional refills 180 tablet 0  . olmesartan-hydrochlorothiazide (BENICAR HCT) 40-12.5 MG tablet Take 1 tablet by mouth daily. Need office visit for additional refills 90 tablet 0  . potassium chloride (KLOR-CON) 10 MEQ tablet Take 1 tablet (10 mEq total) by mouth daily. 90 tablet 0  . sertraline (ZOLOFT) 100 MG tablet Take 1 tablet (100 mg total) by mouth daily. 90 tablet 0  . rivaroxaban (XARELTO) 20 MG TABS tablet Take 1 tablet (20 mg total) by mouth daily with supper. (Patient not taking: Reported on 10/30/2019) 30 tablet 2  . varenicline (CHANTIX PAK) 0.5 MG X 11 & 1 MG X 42 tablet Take one 0.5 mg tablet by mouth once daily for 3 days, then increase to one 0.5 mg tablet twice daily for 4 days, then increase to one 1 mg tablet twice daily. (Patient not taking: Reported on 10/30/2019) 53 tablet 0   No facility-administered medications prior to visit.  ROS See HPI  Objective:  BP 130/78   Pulse 70   Temp (!) 96.4 F (35.8 C) (Tympanic)   Ht _0  (1.854 m)   Wt 166 lb 9.6 oz (75.6 kg)   SpO2 98%   BMI 21.98 kg/m   BP Readings from Last 3 Encounters:  10/30/19 130/78  02/20/19 (!) 146/86  11/21/18 124/76    Wt Readings from Last 3 Encounters:  10/30/19 166 lb 9.6 oz (75.6 kg)  02/20/19 172 lb 9.6 oz (78.3 kg)  11/21/18 163 lb (73.9 kg)    Physical Exam Vitals signs reviewed.  Cardiovascular:     Rate and Rhythm: Normal rate and regular rhythm.     Heart sounds: Normal heart sounds.     Comments: Unable to palpate left distal pulse. Warm skin, toes are not cyanotic Pulmonary:     Effort: Pulmonary effort is normal.     Breath sounds: Normal breath sounds.   Abdominal:     General: Bowel sounds are normal.     Palpations: Abdomen is soft.  Musculoskeletal:     Left lower leg: No edema.  Skin:    Findings: No erythema or rash.     Comments: Left toenails thick and hypertrophic. Callus on left great toe Skin intact  Neurological:     Mental Status: He is alert and oriented to person, place, and time.  Psychiatric:        Mood and Affect: Mood normal.        Behavior: Behavior normal.        Thought Content: Thought content normal.     Lab Results  Component Value Date   WBC 12.2 (H) 11/21/2018   HGB 13.5 11/21/2018   HCT 39.7 11/21/2018   PLT 342.0 11/21/2018   GLUCOSE 145 (H) 10/30/2019   CHOL 177 10/30/2019   TRIG 357.0 (H) 10/30/2019   HDL 29.60 (L) 10/30/2019   LDLDIRECT 95.0 10/30/2019   LDLCALC UNABLE TO CALCULATE IF TRIGLYCERIDE OVER 400 mg/dL 03/27/2014   ALT 20 02/20/2019   AST 20 02/20/2019   NA 136 10/30/2019   K 4.5 10/30/2019   CL 105 10/30/2019   CREATININE 1.53 (H) 10/30/2019   BUN 35 (H) 10/30/2019   CO2 24 10/30/2019   TSH 2.50 11/21/2018   PSA 1.89 11/21/2018   INR 1.00 09/30/2017   HGBA1C 6.8 (H) 10/30/2019   MICROALBUR 5.4 (H) 02/20/2019    Vas [place] Burnard Bunting With/wo Tbi  Result Date: 11/14/2018 LOWER EXTREMITY DOPPLER STUDY Indications: Claudication, rest pain, and peripheral artery disease. High Risk Factors: Current smoker.  Performing Technologist: Ronal Fear RVS, RCS  Examination Guidelines: A complete evaluation includes at minimum, Doppler waveform signals and systolic blood pressure reading at the level of bilateral brachial, anterior tibial, and posterior tibial arteries, when vessel segments are accessible. Bilateral testing is considered an integral part of a complete examination. Photoelectric Plethysmograph (PPG) waveforms and toe systolic pressure readings are included as required and additional duplex testing as needed. Limited examinations for reoccurring indications may be performed as  noted.  ABI Findings: +--------+------------------+-----+--------+--------+ Right   Rt Pressure (mmHg)IndexWaveformComment  +--------+------------------+-----+--------+--------+ [id number]                                     +--------+------------------+-----+--------+--------+ +---------+------------------+-----+----------+-------+ Left     Lt Pressure (mmHg)IndexWaveform  Comment +---------+------------------+-----+----------+-------+ Brachial 140                                      +---------+------------------+-----+----------+-------+  PTA      44                0.30 monophasic        +---------+------------------+-----+----------+-------+ DP                              absent            +---------+------------------+-----+----------+-------+ Great Toe0                 0.00 Abnormal          +---------+------------------+-----+----------+-------+ +-------+-----------+-----------+------------+------------+ ABI/TBIToday's ABIToday's TBIPrevious ABIPrevious TBI +-------+-----------+-----------+------------+------------+ Right  BKA        BKA        BKA         BKA          +-------+-----------+-----------+------------+------------+ Left   0.30       absent     0.52        0.28         +-------+-----------+-----------+------------+------------+  Summary: Right: BKA. 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  *See table(s) above for measurements and observations.  Electronically signed by Curt Jews MD on 11/14/2018 at 1:14:23 PM.   Final     Assessment & Plan:   Zackry was seen today for follow-up.  Diagnoses and all orders for this visit:  Type 2 diabetes mellitus with diabetic polyneuropathy, without long-term current use of insulin (HCC) -     DM_1 Hemoglobin A1c -     HTN_1 BMP -     Ambulatory referral to Podiatry -     DM_eye -     metFORMIN (GLUCOPHAGE) 850 MG tablet; Take 1  tablet (850 mg total) by mouth 2 (two) times daily with a meal. -     atorvastatin (LIPITOR) 20 MG tablet; Take 1 tablet (20 mg total) by mouth daily at 6 PM. -     AMB Referral to [facility] -     Renal Function Panel; Future  Essential hypertension -     HTN_1 BMP -     amLODipine (NORVASC) 5 MG tablet; Take 1 tablet (5 mg total) by mouth daily. -     DM_eye -     olmesartan (BENICAR) 40 MG tablet; Take 1 tablet (40 mg total) by mouth daily. -     Renal Function Panel; Future  Need for influenza vaccination -     Flu Vaccine QUAD High Dose(Fluad)  Recurrent major depressive disorder, in full remission (HCC) -     sertraline (ZOLOFT) 100 MG tablet; Take 1 tablet (100 mg total) by mouth daily.  Hypertrophic toenail -     Ambulatory referral to Podiatry  Hypertriglyceridemia -     HTN_4 Lipid panel -     LDL cholesterol, direct -     fenofibrate (TRICOR) 145 MG tablet; Take 1 tablet (145 mg total) by mouth daily. -     atorvastatin (LIPITOR) 20 MG tablet; Take 1 tablet (20 mg total) by mouth daily at 6 PM. -     AMB [facility]erral [facility]  Stage 3a chronic kidney disease -     Renal Function Panel; Future   I have discontinued Jordan Arellano's rivaroxaban, varenicline, olmesartan-hydrochlorothiazide, and potassium chloride. I have also changed his metFORMIN. Additionally, I am having him start on olmesartan. Lastly, I am having him maintain his aspirin, blood glucose  meter kit and supplies, sertraline, amLODipine, fenofibrate, and atorvastatin.  Meds ordered this encounter  Medications  . sertraline (ZOLOFT) 100 MG tablet    Sig: Take 1 tablet (100 mg total) by mouth daily.    Dispense:  90 tablet    Refill:  3    Order Specific Question:   Supervising Provider    Answer:   Lucille Passy [[id number]]  . amLODipine (NORVASC) 5 MG tablet    Sig: Take 1 tablet (5 mg total) by mouth daily.    Dispense:  90 tablet    Refill:  3    Order  Specific Question:   Supervising Provider    Answer:   Lucille Passy [[id number]]  . olmesartan (BENICAR) 40 MG tablet    Sig: Take 1 tablet (40 mg total) by mouth daily.    Dispense:  90 tablet    Refill:  1    Discontinue benicar-HCTZ and potassium    Order Specific Question:   Supervising Provider    Answer:   Lucille Passy [[id number]]  . fenofibrate (TRICOR) 145 MG tablet    Sig: Take 1 tablet (145 mg total) by mouth daily.    Dispense:  90 tablet    Refill:  1    90 day    Order Specific Question:   Supervising Provider    Answer:   Lucille Passy [[id number]]  . metFORMIN (GLUCOPHAGE) 850 MG tablet    Sig: Take 1 tablet (850 mg total) by mouth 2 (two) times daily with a meal.    Dispense:  180 tablet    Refill:  1    Order Specific Question:   Supervising Provider    Answer:   Lucille Passy [[id number]]  . atorvastatin (LIPITOR) 20 MG tablet    Sig: Take 1 tablet (20 mg total) by mouth daily at 6 PM.    Dispense:  90 tablet    Refill:  1    Order Specific Question:   Supervising Provider    Answer:   Lucille Passy [[id number]]    Problem List Items Addressed This Visit      Cardiovascular and Mediastinum   DM (diabetes mellitus) with peripheral vascular complication ([place]) - Primary   Relevant Medications   amLODipine (NORVASC) 5 MG tablet   olmesartan (BENICAR) 40 MG tablet   fenofibrate (TRICOR) 145 MG tablet   metFORMIN (GLUCOPHAGE) 850 MG tablet   atorvastatin (LIPITOR) 20 MG tablet   Essential hypertension (Chronic)   Relevant Medications   amLODipine (NORVASC) 5 MG tablet   olmesartan (BENICAR) 40 MG tablet   fenofibrate (TRICOR) 145 MG tablet   atorvastatin (LIPITOR) 20 MG tablet   Other Relevant Orders   HTN_1 BMP (Completed)   DM_eye   Renal Function Panel     Genitourinary   Stage 3a chronic kidney disease   Relevant Orders   Renal Function Panel     Other   Anxiety and depression   Relevant Medications   sertraline (ZOLOFT) 100 MG tablet   Hypertriglyceridemia (Chronic)    Relevant Medications   amLODipine (NORVASC) 5 MG tablet   olmesartan (BENICAR) 40 MG tablet   fenofibrate (TRICOR) 145 MG tablet   atorvastatin (LIPITOR) 20 MG tablet   Other Relevant Orders   HTN_4 Lipid panel (Completed)   LDL cholesterol, direct (Completed)   AMB Referral to [facility]    Other Visit Diagnoses    Need for influenza vaccination  Relevant Orders   Flu Vaccine QUAD High Dose(Fluad) (Completed)   Hypertrophic toenail       Relevant Orders   Ambulatory referral to Podiatry       Follow-up: Return in about 4 weeks (around 11/27/2019) for HTN (video).  Wilfred Lacy, NP

## 2019-10-30 NOTE — Patient Instructions (Addendum)
Happy Thanksgiving.  Stable hgbA1c at 6.8 Persistent elevated triglycerides. Entered referral to lipid clinic. Due to persistent decline in renal function (stage 3 kidney disease): change benicar HCTZ to benicar, and stop potassium supplement. Return to lab in 59month for repeat BMP and schedule video appt in 54month for HTN re eval.

## 2019-11-06 ENCOUNTER — Encounter: Payer: Self-pay | Admitting: Nurse Practitioner

## 2019-11-21 ENCOUNTER — Encounter: Payer: Self-pay | Admitting: General Practice

## 2020-03-05 ENCOUNTER — Telehealth: Payer: Self-pay | Admitting: Nurse Practitioner

## 2020-03-05 NOTE — Progress Notes (Signed)
  Chronic Care Management   Outreach Note  03/05/2020 Name: Alyster Bingen MRN: [id number] DOB: Dec 08, 1950  Referred by: Flossie Buffy, NP Reason for referral : No chief complaint on file.   An unsuccessful telephone outreach was attempted today. The patient was referred to the pharmacist for assistance with care management and care coordination.   Follow Up Plan:   Earney Hamburg Upstream Scheduler

## 2020-03-11 ENCOUNTER — Telehealth: Payer: Self-pay | Admitting: Nurse Practitioner

## 2020-03-11 NOTE — Progress Notes (Signed)
  Chronic Care Management   Outreach Note  03/11/2020 Name: Jordan Arellano MRN: [id number] DOB: 1951-09-30  Referred by: Flossie Buffy, NP Reason for referral : No chief complaint on file.   A second unsuccessful telephone outreach was attempted today. The patient was referred to pharmacist for assistance with care management and care coordination.  Follow Up Plan:   Earney Hamburg Upstream Scheduler

## 2020-03-18 ENCOUNTER — Telehealth: Payer: Self-pay | Admitting: Nurse Practitioner

## 2020-03-18 NOTE — Progress Notes (Signed)
  Chronic Care Management   Outreach Note  03/18/2020 Name: Jordan Arellano MRN: [id number] DOB: Apr 05, 1951  Referred by: Flossie Buffy, NP Reason for referral : No chief complaint on file.   An unsuccessful telephone outreach was attempted today. The patient was referred to the pharmacist for assistance with care management and care coordination.   Follow Up Plan:   Earney Hamburg Upstream Scheduler

## 2020-03-27 ENCOUNTER — Telehealth: Payer: Self-pay | Admitting: Nurse Practitioner

## 2020-03-27 NOTE — Progress Notes (Signed)
  Chronic Care Management   Outreach Note  03/27/2020 Name: Jordan Arellano MRN: [id number] DOB: 08-Sep-1951  Referred by: Flossie Buffy, NP Reason for referral : No chief complaint on file.   An unsuccessful telephone outreach was attempted today. The patient was referred to the pharmacist for assistance with care management and care coordination.   Follow Up Plan:   Earney Hamburg Upstream Scheduler

## 2020-04-03 ENCOUNTER — Telehealth: Payer: Self-pay | Admitting: Nurse Practitioner

## 2020-04-03 NOTE — Progress Notes (Signed)
  Chronic Care Management   Outreach Note  04/03/2020 Name: Jordan Arellano MRN: [id number] DOB: 08-Aug-1951  Referred by: Flossie Buffy, NP Reason for referral : No chief complaint on file.   An unsuccessful telephone outreach was attempted today. The patient was referred to the pharmacist for assistance with care management and care coordination.   This note is not being shared with the patient for the following reason: To respect privacy (The patient or proxy has requested that the information not be shared).  Follow Up Plan:   Earney Hamburg Upstream Scheduler

## 2020-06-26 ENCOUNTER — Other Ambulatory Visit: Payer: Self-pay

## 2020-06-27 ENCOUNTER — Ambulatory Visit: Payer: Medicare HMO | Admitting: Nurse Practitioner

## 2020-06-27 ENCOUNTER — Encounter: Payer: Self-pay | Admitting: Nurse Practitioner

## 2020-06-27 VITALS — BP 128/68 | HR 72 | Temp 97.8°F | Ht 73.0 in | Wt 162.0 lb

## 2020-06-27 DIAGNOSIS — Z125 Encounter for screening for malignant neoplasm of prostate: Secondary | ICD-10-CM

## 2020-06-27 DIAGNOSIS — F3342 Major depressive disorder, recurrent, in full remission: Secondary | ICD-10-CM | POA: Insufficient documentation

## 2020-06-27 DIAGNOSIS — E781 Pure hyperglyceridemia: Secondary | ICD-10-CM | POA: Diagnosis not present

## 2020-06-27 DIAGNOSIS — I10 Essential (primary) hypertension: Secondary | ICD-10-CM | POA: Diagnosis not present

## 2020-06-27 DIAGNOSIS — N1831 Chronic kidney disease, stage 3a: Secondary | ICD-10-CM | POA: Diagnosis not present

## 2020-06-27 DIAGNOSIS — I701 Atherosclerosis of renal artery: Secondary | ICD-10-CM | POA: Diagnosis not present

## 2020-06-27 DIAGNOSIS — Z720 Tobacco use: Secondary | ICD-10-CM | POA: Diagnosis not present

## 2020-06-27 DIAGNOSIS — E1151 Type 2 diabetes mellitus with diabetic peripheral angiopathy without gangrene: Secondary | ICD-10-CM | POA: Diagnosis not present

## 2020-06-27 DIAGNOSIS — F325 Major depressive disorder, single episode, in full remission: Secondary | ICD-10-CM

## 2020-06-27 DIAGNOSIS — E1142 Type 2 diabetes mellitus with diabetic polyneuropathy: Secondary | ICD-10-CM

## 2020-06-27 LAB — CBC
HCT: 39.4 % (ref 39.0–52.0)
Hemoglobin: 13.1 g/dL (ref 13.0–17.0)
MCHC: 33.3 g/dL (ref 30.0–36.0)
MCV: 90.9 fl (ref 78.0–100.0)
Platelets: 282 10*3/uL (ref 150.0–400.0)
RBC: 4.33 Mil/uL (ref 4.22–5.81)
RDW: 15.8 % — ABNORMAL HIGH (ref 11.5–15.5)
WBC: 9.9 10*3/uL (ref 4.0–10.5)

## 2020-06-27 LAB — LIPID PANEL
Cholesterol: 161 mg/dL (ref 0–200)
HDL: 29.1 mg/dL — ABNORMAL LOW (ref >39.00)
NonHDL: 131.68
Total CHOL/HDL Ratio: 6
Triglycerides: 296 mg/dL — ABNORMAL HIGH (ref 0.0–149.0)
VLDL: 59.2 mg/dL — ABNORMAL HIGH (ref 0.0–40.0)

## 2020-06-27 LAB — BASIC METABOLIC PANEL
BUN: 15 mg/dL (ref 6–23)
CO2: 26 mEq/L (ref 19–32)
Calcium: 9.1 mg/dL (ref 8.4–10.5)
Chloride: 107 mEq/L (ref 96–112)
Creatinine, Ser: 1 mg/dL (ref 0.40–1.50)
GFR: 74.32 mL/min (ref >60.00)
Glucose, Bld: 136 mg/dL — ABNORMAL HIGH (ref 70–99)
Potassium: 3.9 mEq/L (ref 3.5–5.1)
Sodium: 140 mEq/L (ref 135–145)

## 2020-06-27 LAB — PSA: PSA: 0.7 ng/mL (ref 0.10–4.00)

## 2020-06-27 LAB — HEPATIC FUNCTION PANEL
ALT: 10 U/L (ref 0–53)
AST: 11 U/L (ref 0–37)
Albumin: 4.1 g/dL (ref 3.5–5.2)
Alkaline Phosphatase: 64 U/L (ref 39–117)
Bilirubin, Direct: 0.1 mg/dL (ref 0.0–0.3)
Total Bilirubin: 0.4 mg/dL (ref 0.2–1.2)
Total Protein: 6.9 g/dL (ref 6.0–8.3)

## 2020-06-27 LAB — HEMOGLOBIN A1C: Hgb A1c MFr Bld: 6.7 % — ABNORMAL HIGH (ref 4.6–6.5)

## 2020-06-27 LAB — LDL CHOLESTEROL, DIRECT: Direct LDL: 81 mg/dL

## 2020-06-27 MED ORDER — AMLODIPINE BESYLATE 5 MG PO TABS: 5.0000 mg | ORAL_TABLET | Freq: Every day | ORAL | 3 refills | Status: DC

## 2020-06-27 MED ORDER — OLMESARTAN MEDOXOMIL 40 MG PO TABS: 40.0000 mg | ORAL_TABLET | Freq: Every day | ORAL | 3 refills | Status: DC

## 2020-06-27 MED ORDER — ATORVASTATIN CALCIUM 40 MG PO TABS: 40.0000 mg | ORAL_TABLET | Freq: Every day | ORAL | 3 refills | Status: DC

## 2020-06-27 MED ORDER — SERTRALINE HCL 100 MG PO TABS: 100.0000 mg | ORAL_TABLET | Freq: Every day | ORAL | 3 refills | Status: DC

## 2020-06-27 NOTE — Patient Instructions (Addendum)
Normal PSA Improved renal function Stable hgbA1c at 6.7, cbc and liver function. Persistent abnormal lipid panel. Continue current medications. All medications refilled F/up in 79months  Let me know if you change your mind about Ct chest to screen for lung cancer.  Tobacco cessation is recommendation.  Get Your Vaccine - All Phases Now Eligible Everyone 40 and older who wants a COVID-19 vaccine can get one through [facility].  Please review our clinic sites below for vaccine type and associated age requirement before scheduling.  [place]  ([place]) [place] [employer] [address] [place], [place] [address] Hours: Thu: 1p-5p Type: Moderna (18+)   [place]  ([place]) [address] [place], [place] [address] Hours: Thu-Sat 10a-5p Type: [place] (16+)  [place]  [facility]) [employer] [address] [place], [place] [address] Hours: Thu-Sat 8a-12p Type: Pfizer (16+)  [place]  [facility]) [employer]. [place], [place] [address] Hours: Thu-Fri 8a-12p Type: Moderna (18+)  Schedule Your FREE Vaccine Appointment at [email or web address]  WALK-INS PERMITTED: Please arrive two hours before clinic closing if you do not have an appointment.  Phone Assistance: Please call [phone], Monday through Friday between 7 a.m. and 7 p.m. Other Vaccine Locations: Find all vaccine locations throughout the state of [place] at [email or web address]  WALK-INS PERMITTED: Please arrive two hours before clinic closing if you do not have an appointment.  Phone Assistance: Please call [phone], Monday through Friday between 7 a.m. and 7 p.m. Other Vaccine Locations: Find all vaccine locations throughout the state of [place] at  [email or web address]  [employer] Vaccine Following federal and state guidance, [facility] plans to offer the [employer] vaccine in weeks ahead at select locations. [place] has no scheduled clinics where the [facility] vaccine will be offered at this time. The CDC's recommendation for vaccine providers to resume use of the [employer] vaccine is available at [email or web address]

## 2020-06-27 NOTE — Assessment & Plan Note (Signed)
1/2ppd Declined ref to pulmonology for CT chest and AAA screen.

## 2020-06-27 NOTE — Assessment & Plan Note (Signed)
BP at goal with olmesartan, and amlodipine BP Readings from Last 3 Encounters:  06/27/20 128/68  10/30/19 130/78  02/20/19 (!) 146/86   Maintain current medications Repeat BMP today

## 2020-06-27 NOTE — Assessment & Plan Note (Signed)
>>  ASSESSMENT AND PLAN FOR RECURRENT MAJOR DEPRESSIVE DISORDER, IN FULL REMISSION (HCC) WRITTEN ON 06/27/2020  1:31 PM BY NCHE, CHARLOTTE LUM, NP  Stable mood with zoloft . Refill sent. Depression screen [facility] 2/9 06/27/2020 06/27/2020 10/30/2019  Decreased Interest 0 0 0  Down, Depressed, Hopeless 0 0 0  PHQ - 2 Score 0 0 0  Altered sleeping 0 - 0  Tired, decreased energy 0 - 0  Change in appetite 0 - 0  Feeling bad or failure about yourself  0 - 0  Trouble concentrating 0 - 0  Moving slowly or fidgety/restless 0 - 0  Suicidal thoughts 0 - 0  PHQ-9 Score 0 - 0

## 2020-06-27 NOTE — Progress Notes (Signed)
Subjective:  Patient ID: Jordan Arellano, male    DOB: [birth year]  Age: 69 y.o. MRN: [id number]  CC: Follow-up (med refills-Olmesartan, metformin, fenofibrate and amlodipine//not covid vaccinated//no shots today)  HPI  Essential hypertension BP at goal with olmesartan, and amlodipine BP Readings from Last 3 Encounters:  06/27/20 128/68  10/30/19 130/78  02/20/19 (!) 146/86   Maintain current medications Repeat BMP today  DM (diabetes mellitus) with peripheral vascular complication ([place]) Controlled with metformin. 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 Persistent elevated triglyceride despite use of fenofibrate and atorvastatin. He did not schedule appt with lipid clinic as previous instructed.  Improved renal function Stable hgbA1c at 6.7 Persistent abnormal lipid panel. Continue current medications. medications refilled F/up in 55month    Tobacco abuse 1/2ppd Declined ref to pulmonology for CT chest and AAA screen.  [place] renal artery stenosis ([place], 90% CKD-stage 3: medication adjustment made 10/2019 Repeat BMP today, if no improvement refer to nephrology.   Recurrent major depressive disorder, in full remission ([place] Stable mood with zoloft. Refill sent. [facility] 06/[facility]/2020 06/27/2020 10/30/2019  Decreased Interest 0 0 0  Down, Depressed, Hopeless 0 0 0  PHQ - 2 Score 0 0 0  Altered sleeping 0 - 0  Tired, decreased energy 0 - 0  Change in appetite 0 - 0  Feeling bad or failure about yourself  0 - 0  Trouble concentrating 0 - 0  Moving slowly or fidgety/restless 0 - 0  Suicidal thoughts 0 - 0  PHQ-9 Score 0 - 0    Stage 3a chronic kidney disease Improved renal function with discontinuation of diuretic.  BMP Latest Ref Rng & Units 06/27/2020 10/30/2019 02/20/2019  Glucose 70 - 99 mg/dL 136(H) 145(H) 163(H)  BUN 6 - 23 mg/dL 15 35(H) 18  Creatinine 0.40 -  1.50 mg/dL 1.00 1.53(H) 1.23  Sodium 135 - 145 mEq/L 140 136 136  Potassium 3.5 - 5.1 mEq/L 3.9 4.5 4.5  Chloride 96 - 112 mEq/L 107 105 103  CO2 19 - 32 mEq/L '26 24 26  '$ Calcium 8.4 - 10.5 mg/dL 9.1 9.2 9.3    Reviewed past Medical, Social and Family history today.  Outpatient Medications Prior to Visit  Medication Sig Dispense Refill  . aspirin 81 MG tablet Take 81 mg by mouth daily.    . blood glucose meter kit and supplies KIT Dispense based on patient and insurance preference. Use two times daily as directed (before breakfast and at bedtime. (FOR ICD-10: E08.42) 1 each 2  . amLODipine (NORVASC) 5 MG tablet Take 1 tablet (5 mg total) by mouth daily. 90 tablet 3  . atorvastatin (LIPITOR) 20 MG tablet Take 1 tablet (20 mg total) by mouth daily at 6 PM. 90 tablet 1  . fenofibrate (TRICOR) 145 MG tablet Take 1 tablet (145 mg total) by mouth daily. 90 tablet 1  . metFORMIN (GLUCOPHAGE) 850 MG tablet Take 1 tablet (850 mg total) by mouth 2 (two) times daily with a meal. 180 tablet 1  . olmesartan (BENICAR) 40 MG tablet Take 1 tablet (40 mg total) by mouth daily. 90 tablet 1  . sertraline (ZOLOFT) 100 MG tablet Take 1 tablet (100 mg total) by mouth daily. 90 tablet 3   No facility-administered medications prior to visit.    ROS See HPI  Objective:  BP 128/68   Pulse 72   Temp 97.8 F (36.6 C) (  Tympanic)   Ht '6\' 1"'$  (1.854 m)   Wt 162 lb (73.5 kg)   SpO2 99%   BMI 21.37 kg/m   Physical Exam Cardiovascular:     Pulses: Normal pulses.     Heart sounds: Normal heart sounds.  Pulmonary:     Effort: Pulmonary effort is normal.     Breath sounds: Normal breath sounds.  Abdominal:     General: Bowel sounds are normal.     Palpations: Abdomen is soft.  Musculoskeletal:     Left lower leg: No edema.  Skin:    Findings: No erythema.  Neurological:     Mental Status: He is alert and oriented to person, place, and time.  Psychiatric:        Mood and Affect: Mood normal.         Behavior: Behavior normal.        Thought Content: Thought content normal.     Assessment & Plan:  This visit occurred during the SARS-CoV-2 public health emergency.  Safety protocols were in place, including screening questions prior to the visit, additional usage of staff PPE, and extensive cleaning of exam room while observing appropriate contact time as indicated for disinfecting solutions.   Jordan Arellano was seen today for follow-up.  Diagnoses and all orders for this visit:  Essential hypertension -     CBC -     Basic metabolic panel -     amLODipine (NORVASC) 5 MG tablet; Take 1 tablet (5 mg total) by mouth daily. -     olmesartan (BENICAR) 40 MG tablet; Take 1 tablet (40 mg total) by mouth daily.  Stage 3a chronic kidney disease -     Basic metabolic panel  DM (diabetes mellitus) with peripheral vascular complication (HCC) -     Hepatic function panel -     Hemoglobin A1c -     atorvastatin (LIPITOR) 40 MG tablet; Take 1 tablet (40 mg total) by mouth daily at 6 PM. -     metFORMIN (GLUCOPHAGE) 850 MG tablet; Take 1 tablet (850 mg total) by mouth 2 (two) times daily with a meal.  Hypertriglyceridemia -     Hepatic function panel -     Lipid panel -     atorvastatin (LIPITOR) 40 MG tablet; Take 1 tablet (40 mg total) by mouth daily at 6 PM. -     LDL cholesterol, direct -     fenofibrate (TRICOR) 145 MG tablet; Take 1 tablet (145 mg total) by mouth daily.  Prostate cancer screening -     PSA  Recurrent major depressive disorder, in full remission (HCC) -     sertraline (ZOLOFT) 100 MG tablet; Take 1 tablet (100 mg total) by mouth daily.  Tobacco abuse  Left renal artery stenosis (HCC) -     Basic metabolic panel -     Lipid panel -     atorvastatin (LIPITOR) 40 MG tablet; Take 1 tablet (40 mg total) by mouth daily at 6 PM. -     LDL cholesterol, direct    Problem List Items Addressed This Visit      Cardiovascular and Mediastinum   DM (diabetes mellitus) with  peripheral vascular complication (HCC)    Controlled with metformin. 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 Persistent elevated triglyceride despite use of fenofibrate and atorvastatin. He did not schedule appt with lipid clinic as previous instructed.  Improved renal function Stable hgbA1c  at 6.7 Persistent abnormal lipid panel. Continue current medications. medications refilled F/up in 57month        Relevant Medications   amLODipine (NORVASC) 5 MG tablet   olmesartan (BENICAR) 40 MG tablet   atorvastatin (LIPITOR) 40 MG tablet   fenofibrate (TRICOR) 145 MG tablet   metFORMIN (GLUCOPHAGE) 850 MG tablet   Other Relevant Orders   Hepatic function panel (Completed)   Hemoglobin A1c (Completed)   Essential hypertension - Primary (Chronic)    BP at goal with olmesartan, and amlodipine BP Readings from Last 3 Encounters:  06/27/20 128/68  10/30/19 130/78  02/20/19 (!) 146/86   Maintain current medications Repeat BMP today      Relevant Medications   amLODipine (NORVASC) 5 MG tablet   olmesartan (BENICAR) 40 MG tablet   atorvastatin (LIPITOR) 40 MG tablet   fenofibrate (TRICOR) 145 MG tablet   Other Relevant Orders   CBC (Completed)   Basic metabolic panel (Completed)   Left renal artery stenosis ([place]    [place], 90% CKD-stage 3: medication adjustment made 10/2019 Repeat BMP today, if no improvement refer to nephrology.       Relevant Medications   amLODipine (NORVASC) 5 MG tablet   olmesartan (BENICAR) 40 MG tablet   atorvastatin (LIPITOR) 40 MG tablet   fenofibrate (TRICOR) 145 MG tablet   Other Relevant Orders   Basic metabolic panel (Completed)   Lipid panel (Completed)   LDL cholesterol, direct (Completed)     Genitourinary   Stage 3a chronic kidney disease    Improved renal function with discontinuation of diuretic.  BMP Latest Ref Rng & Units 06/27/2020 10/30/2019 02/20/2019    Glucose 70 - 99 mg/dL 136(H) 145(H) 163(H)  BUN 6 - 23 mg/dL 15 35(H) 18  Creatinine 0.40 - 1.50 mg/dL 1.00 1.53(H) 1.23  Sodium 135 - 145 mEq/L 140 136 136  Potassium 3.5 - 5.1 mEq/L 3.9 4.5 4.5  Chloride 96 - 112 mEq/L 107 105 103  CO2 19 - 32 mEq/L '26 24 26  '$ Calcium 8.4 - 10.5 mg/dL 9.1 9.2 9.3        Relevant Orders   Basic metabolic panel (Completed)     Other   Hypertriglyceridemia (Chronic)   Relevant Medications   amLODipine (NORVASC) 5 MG tablet   olmesartan (BENICAR) 40 MG tablet   atorvastatin (LIPITOR) 40 MG tablet   fenofibrate (TRICOR) 145 MG tablet   Other Relevant Orders   Hepatic function panel (Completed)   Lipid panel (Completed)   LDL cholesterol, direct (Completed)   Recurrent major depressive disorder, in full remission (HCC)    Stable mood with zoloft. Refill sent. [facility] 06/[facility]/2020 06/27/2020 10/30/2019  Decreased Interest 0 0 0  Down, Depressed, Hopeless 0 0 0  PHQ - 2 Score 0 0 0  Altered sleeping 0 - 0  Tired, decreased energy 0 - 0  Change in appetite 0 - 0  Feeling bad or failure about yourself  0 - 0  Trouble concentrating 0 - 0  Moving slowly or fidgety/restless 0 - 0  Suicidal thoughts 0 - 0  PHQ-9 Score 0 - 0        Relevant Medications   sertraline (ZOLOFT) 100 MG tablet   Tobacco abuse (Chronic)    1/2ppd Declined ref to pulmonology for CT chest and AAA screen.       Other Visit Diagnoses    Prostate cancer screening       Relevant Orders  PSA (Completed)      Follow-up: Return in about 6 months (around 12/28/2020) for DM and HTN, hyperlipidemia (F2F, 39mns).  CWilfred Lacy NP

## 2020-06-27 NOTE — Assessment & Plan Note (Addendum)
Controlled with metformin. 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 Persistent elevated triglyceride despite use of fenofibrate and atorvastatin. He did not schedule appt with lipid clinic as previous instructed.  Improved renal function Stable hgbA1c at 6.7 Persistent abnormal lipid panel. Continue current medications. medications refilled F/up in 63months

## 2020-06-27 NOTE — Assessment & Plan Note (Signed)
Stable mood with zoloft. Refill sent. Depression screen [facility] 2/9 06/27/2020 06/27/2020 10/30/2019  Decreased Interest 0 0 0  Down, Depressed, Hopeless 0 0 0  PHQ - 2 Score 0 0 0  Altered sleeping 0 - 0  Tired, decreased energy 0 - 0  Change in appetite 0 - 0  Feeling bad or failure about yourself  0 - 0  Trouble concentrating 0 - 0  Moving slowly or fidgety/restless 0 - 0  Suicidal thoughts 0 - 0  PHQ-9 Score 0 - 0

## 2020-06-27 NOTE — Assessment & Plan Note (Signed)
Per [place] completed 2018, 90% CKD-stage 3: medication adjustment made 10/2019 Repeat BMP today, if no improvement refer to nephrology.

## 2020-06-30 ENCOUNTER — Encounter: Payer: Self-pay | Admitting: Nurse Practitioner

## 2020-06-30 MED ORDER — FENOFIBRATE 145 MG PO TABS: 145.0000 mg | ORAL_TABLET | Freq: Every day | ORAL | 3 refills | Status: DC

## 2020-06-30 MED ORDER — METFORMIN HCL 850 MG PO TABS: 850.0000 mg | ORAL_TABLET | Freq: Two times a day (BID) | ORAL | 1 refills | Status: DC

## 2020-06-30 NOTE — Assessment & Plan Note (Signed)
Improved renal function with discontinuation of diuretic.  BMP Latest Ref Rng & Units 06/27/2020 10/30/2019 02/20/2019  Glucose 70 - 99 mg/dL 136(H) 145(H) 163(H)  BUN 6 - 23 mg/dL 15 35(H) 18  Creatinine 0.40 - 1.50 mg/dL 1.00 1.53(H) 1.23  Sodium 135 - 145 mEq/L 140 136 136  Potassium 3.5 - 5.1 mEq/L 3.9 4.5 4.5  Chloride 96 - 112 mEq/L 107 105 103  CO2 19 - 32 mEq/L 26 24 26   Calcium 8.4 - 10.5 mg/dL 9.1 9.2 9.3

## 2020-07-01 ENCOUNTER — Telehealth: Payer: Self-pay | Admitting: Nurse Practitioner

## 2020-07-01 NOTE — Telephone Encounter (Signed)
Patient is calling and requesting a refill for all medications listed to be sent to [facility], please advise. CB is [phone].

## 2020-07-08 NOTE — Telephone Encounter (Signed)
Checked pt's list and it looks like his pharmacy is ut[facility] and all meds have been recently sent to [facility].

## 2020-10-16 ENCOUNTER — Ambulatory Visit (INDEPENDENT_AMBULATORY_CARE_PROVIDER_SITE_OTHER): Payer: Medicare HMO

## 2020-10-16 VITALS — Ht 73.0 in | Wt 162.0 lb

## 2020-10-16 DIAGNOSIS — I10 Essential (primary) hypertension: Secondary | ICD-10-CM

## 2020-10-16 DIAGNOSIS — Z0000 Encounter for general adult medical examination without abnormal findings: Secondary | ICD-10-CM

## 2020-10-16 NOTE — Patient Instructions (Signed)
Jordan Arellano , Thank you for taking time to complete your Medicare Wellness Visit. I appreciate your ongoing commitment to your health goals. Please review the following plan we discussed and let me know if I can assist you in the future.   Screening recommendations/referrals: Colonoscopy: Completed Cologuard 03/05/2019- Due 03/04/2022 Recommended yearly ophthalmology/optometry visit for glaucoma screening and checkup Recommended yearly dental visit for hygiene and checkup  Vaccinations: Influenza vaccine: Due- May obtain vaccine at our office or your local pharmacy Pneumococcal vaccine: Prevnar-13 due- May obtain vaccine at our office or your local pharmacy Tdap vaccine: Per our conversation today, up to date-Please bring documentation of vaccine to your next appt if available . Shingles vaccine: Discuss with pharmacy  Covid-19: Declined  Advanced directives: Information mailed today  Conditions/risks identified: See problem list  Next appointment: Follow up in one year for your annual wellness visit.   Preventive Care 68 Years and Older, Male Preventive care refers to lifestyle choices and visits with your health care provider that can promote health and wellness. What does preventive care include?  A yearly physical exam. This is also called an annual well check.  Dental exams once or twice a year.  Routine eye exams. Ask your health care provider how often you should have your eyes checked.  Personal lifestyle choices, including:  Daily care of your teeth and gums.  Regular physical activity.  Eating a healthy diet.  Avoiding tobacco and drug use.  Limiting alcohol use.  Practicing safe sex.  Taking low doses of aspirin every day.  Taking vitamin and mineral supplements as recommended by your health care provider. What happens during an annual well check? The services and screenings done by your health care provider during your annual well check will depend on your  age, overall health, lifestyle risk factors, and family history of disease. Counseling  Your health care provider may ask you questions about your:  Alcohol use.  Tobacco use.  Drug use.  Emotional well-being.  Home and relationship well-being.  Sexual activity.  Eating habits.  History of falls.  Memory and ability to understand (cognition).  Work and work Statistician. Screening  You may have the following tests or measurements:  Height, weight, and BMI.  Blood pressure.  Lipid and cholesterol levels. These may be checked every 5 years, or more frequently if you are over 68 years old.  Skin check.  Lung cancer screening. You may have this screening every year starting at age 68 if you have a 30-pack-year history of smoking and currently smoke or have quit within the past 15 years.  Fecal occult blood test (FOBT) of the stool. You may have this test every year starting at age 68.  Flexible sigmoidoscopy or colonoscopy. You may have a sigmoidoscopy every 5 years or a colonoscopy every 10 years starting at age 68.  Prostate cancer screening. Recommendations will vary depending on your family history and other risks.  Hepatitis C blood test.  Hepatitis B blood test.  Sexually transmitted disease (STD) testing.  Diabetes screening. This is done by checking your blood sugar (glucose) after you have not eaten for a while (fasting). You may have this done every 1-3 years.  Abdominal aortic aneurysm (AAA) screening. You may need this if you are a current or former smoker.  Osteoporosis. You may be screened starting at age 68 if you are at high risk. Talk with your health care provider about your test results, treatment options, and if necessary, the need for  more tests. Vaccines  Your health care provider may recommend certain vaccines, such as:  Influenza vaccine. This is recommended every year.  Tetanus, diphtheria, and acellular pertussis (Tdap, Td) vaccine. You  may need a Td booster every 10 years.  Zoster vaccine. You may need this after age 68.  Pneumococcal 13-valent conjugate (PCV13) vaccine. One dose is recommended after age 68.  Pneumococcal polysaccharide (PPSV23) vaccine. One dose is recommended after age 68. Talk to your health care provider about which screenings and vaccines you need and how often you need them. This information is not intended to replace advice given to you by your health care provider. Make sure you discuss any questions you have with your health care provider. Document Released: 12/19/2015 Document Revised: 08/11/2016 Document Reviewed: 09/23/2015 Elsevier Interactive Patient Education  2017 [place] Prevention in the Home Falls can cause injuries. They can happen to people of all ages. There are many things you can do to make your home safe and to help prevent falls. What can I do on the outside of my home?  Regularly fix the edges of walkways and driveways and fix any cracks.  Remove anything that might make you trip as you walk through a door, such as a raised step or threshold.  Trim any bushes or trees on the path to your home.  Use bright outdoor lighting.  Clear any walking paths of anything that might make someone trip, such as rocks or tools.  Regularly check to see if handrails are loose or broken. Make sure that both sides of any steps have handrails.  Any raised decks and porches should have guardrails on the edges.  Have any leaves, snow, or ice cleared regularly.  Use sand or salt on walking paths during winter.  Clean up any spills in your garage right away. This includes oil or grease spills. What can I do in the bathroom?  Use night lights.  Install grab bars by the toilet and in the tub and shower. Do not use towel bars as grab bars.  Use non-skid mats or decals in the tub or shower.  If you need to sit down in the shower, use a plastic, non-slip stool.  Keep the floor  dry. Clean up any water that spills on the floor as soon as it happens.  Remove soap buildup in the tub or shower regularly.  Attach bath mats securely with double-sided non-slip rug tape.  Do not have throw rugs and other things on the floor that can make you trip. What can I do in the bedroom?  Use night lights.  Make sure that you have a light by your bed that is easy to reach.  Do not use any sheets or blankets that are too big for your bed. They should not hang down onto the floor.  Have a firm chair that has side arms. You can use this for support while you get dressed.  Do not have throw rugs and other things on the floor that can make you trip. What can I do in the kitchen?  Clean up any spills right away.  Avoid walking on wet floors.  Keep items that you use a lot in easy-to-reach places.  If you need to reach something above you, use a strong step stool that has a grab bar.  Keep electrical cords out of the way.  Do not use floor polish or wax that makes floors slippery. If you must use wax, use non-skid  floor wax.  Do not have throw rugs and other things on the floor that can make you trip. What can I do with my stairs?  Do not leave any items on the stairs.  Make sure that there are handrails on both sides of the stairs and use them. Fix handrails that are broken or loose. Make sure that handrails are as long as the stairways.  Check any carpeting to make sure that it is firmly attached to the stairs. Fix any carpet that is loose or worn.  Avoid having throw rugs at the top or bottom of the stairs. If you do have throw rugs, attach them to the floor with carpet tape.  Make sure that you have a light switch at the top of the stairs and the bottom of the stairs. If you do not have them, ask someone to add them for you. What else can I do to help prevent falls?  Wear shoes that:  Do not have high heels.  Have rubber bottoms.  Are comfortable and fit you  well.  Are closed at the toe. Do not wear sandals.  If you use a stepladder:  Make sure that it is fully opened. Do not climb a closed stepladder.  Make sure that both sides of the stepladder are locked into place.  Ask someone to hold it for you, if possible.  Clearly mark and make sure that you can see:  Any grab bars or handrails.  First and last steps.  Where the edge of each step is.  Use tools that help you move around (mobility aids) if they are needed. These include:  Canes.  Walkers.  Scooters.  Crutches.  Turn on the lights when you go into a dark area. Replace any light bulbs as soon as they burn out.  Set up your furniture so you have a clear path. Avoid moving your furniture around.  If any of your floors are uneven, fix them.  If there are any pets around you, be aware of where they are.  Review your medicines with your doctor. Some medicines can make you feel dizzy. This can increase your chance of falling. Ask your doctor what other things that you can do to help prevent falls. This information is not intended to replace advice given to you by your health care provider. Make sure you discuss any questions you have with your health care provider. Document Released: 09/18/2009 Document Revised: 04/29/2016 Document Reviewed: 12/27/2014 Elsevier Interactive Patient Education  2017 [employer].

## 2020-10-16 NOTE — Progress Notes (Signed)
Subjective:   Jordan Arellano is a 69 y.o. male who presents for an Initial Medicare Annual Wellness Visit.   I connected with Jordan Arellano today by telephone and verified that I am speaking with the correct person using two identifiers. Location patient: home Location provider: work Persons participating in the virtual visit: patient, Marine scientist.    I discussed the limitations, risks, security and privacy concerns of performing an evaluation and management service by telephone and the availability of in person appointments. I also discussed with the patient that there may be a patient responsible charge related to this service. The patient expressed understanding and verbally consented to this telephonic visit.    Interactive audio and video telecommunications were attempted between this provider and patient, however failed, due to patient having technical difficulties OR patient did not have access to video capability.  We continued and completed visit with audio only.  Some vital signs may be absent or patient reported.   Time Spent with patient on telephone encounter: 25 minutes  Review of Systems     Cardiac Risk Factors include: advanced age (>22men, >72 women);diabetes mellitus;dyslipidemia;hypertension;smoking/ tobacco exposure     Objective:    Today's Vitals   10/16/20 1245  Weight: 162 lb (73.5 kg)  Height: $Remove'6\' 1"'OifBqeE$  (1.854 m)   Body mass index is 21.37 kg/m.  Advanced Directives 10/16/2020 10/01/2017 12/17/2016 12/12/2016 09/30/2016 03/28/2014 03/25/2014  Does Patient Have a Medical Advance Directive? No No No No No Patient does not have advance directive;Patient would like information Patient does not have advance directive;Patient would like information  Would patient like information on creating a medical advance directive? Yes (MAU/Ambulatory/Procedural Areas - Information given) No - Patient declined No - Patient declined - No - patient declined information Advance directive packet  given Advance directive packet given  Pre-existing out of facility DNR order (yellow form or pink MOST form) - - - - - No No    Current Medications (verified) Outpatient Encounter Medications as of 10/16/2020  Medication Sig   amLODipine (NORVASC) 5 MG tablet Take 1 tablet (5 mg total) by mouth daily.   aspirin 81 MG tablet Take 81 mg by mouth daily.   atorvastatin (LIPITOR) 40 MG tablet Take 1 tablet (40 mg total) by mouth daily at 6 PM.   blood glucose meter kit and supplies KIT Dispense based on patient and insurance preference. Use two times daily as directed (before breakfast and at bedtime. (FOR ICD-10: E08.42)   fenofibrate (TRICOR) 145 MG tablet Take 1 tablet (145 mg total) by mouth daily.   metFORMIN (GLUCOPHAGE) 850 MG tablet Take 1 tablet (850 mg total) by mouth 2 (two) times daily with a meal.   olmesartan (BENICAR) 40 MG tablet Take 1 tablet (40 mg total) by mouth daily.   sertraline (ZOLOFT) 100 MG tablet Take 1 tablet (100 mg total) by mouth daily.   No facility-administered encounter medications on file as of 10/16/2020.    Allergies (verified) Patient has no known allergies.   History: Past Medical History:  Diagnosis Date   Depression    Hyperlipidemia    Hypertension    PAD (peripheral artery disease) ([place]) ~2007   s/p R BKA for non-healing wound   Stroke ([place]) 03/2014   MRI: Acute nonhemorrhagic left paracentral pontine infarct. 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   TIA (transient ischemic attack) 01/2014   Past Surgical History:  Procedure Laterality Date   BELOW  KNEE LEG AMPUTATION Right    FEMORAL-POPLITEAL BYPASS GRAFT Left 12/17/2016   Procedure: BYPASS LEFT FEMORAL TO BELOW POPLITEAL ARTERY USING PROPATEN GORE GRAFT;  Surgeon: Rosetta Posner, MD;  Location: [facility];  Service: Vascular;  Laterality: Left;   FEMORAL-POPLITEAL BYPASS GRAFT Left 09/30/2017    Procedure: LEFT LEG ANGIOGRAM,  THROMBECTOMY, FEM-POPLITEAL BYPASS GRAFT, tHROMBECTOMY PERONEAL ARTERY AND POSTERIOR TIBIAL , ENDARTERECTOMY TIBIAL/PERONEAL TRUNK WITH BOVINE PATCH ANGIOPLASTY.;  Surgeon: Conrad Portal, MD;  Location: [facility];  Service: Vascular;  Laterality: Left;   PERIPHERAL VASCULAR CATHETERIZATION Left 12/14/2016   Procedure: Abdominal Aortogram w/Lower Extremity;  Surgeon: Angelia Mould, MD;  Location: [facility];  Service: Cardiovascular;  Laterality: Left;   TRANSTHORACIC ECHOCARDIOGRAM  01/2014   To evaluate possible CVA: EF 55-60%. GR 1 DD. No significant valvular lesions   Family History  Problem Relation Age of Onset   Hypertension Mother        Does not know history   Heart disease Mother    Stroke Mother    Diabetes Mother    Hypertension Father    Heart disease Father    Stroke Father    Diabetes Sister    Hypertension Sister    Heart disease Brother    Hypertension Brother    Social History   Socioeconomic History   Marital status: Widowed    Spouse name: Not on file   Number of children: Not on file   Years of education: Not on file   Highest education level: Not on file  Occupational History    Comment: Disabled  Tobacco Use   Smoking status: Current Some Day Smoker    Packs/day: 0.50    Years: 35.00    Pack years: 17.50    Types: Cigarettes   Smokeless tobacco: Never Used  Scientific laboratory technician Use: Never used  Substance and Sexual Activity   Alcohol use: No   Drug use: No   Sexual activity: Not Currently  Other Topics Concern   Not on file  Social History Narrative   Not on file   Social Determinants of Health   Financial Resource Strain: Low Risk    Difficulty of Paying Living Expenses: Not hard at all  Food Insecurity: No Food Insecurity   Worried About Charity fundraiser in the Last Year: Never true   [place] in the Last Year: Never true  Transportation Needs: No  Transportation Needs   Lack of Transportation (Medical): No   Lack of Transportation (Non-Medical): No  Physical Activity: Insufficiently Active   Days of Exercise per Week: 2 days   Minutes of Exercise per Session: 20 min  Stress: No Stress Concern Present   Feeling of Stress : Not at all  Social Connections: Socially Isolated   Frequency of Communication with Friends and Family: More than three times a week   Frequency of Social Gatherings with Friends and Family: Once a week   Attends Religious Services: Never   Marine scientist or Organizations: No   Attends Archivist Meetings: Never   Marital Status: Widowed    Tobacco Counseling Ready to quit: Not Answered Counseling given: Not Answered   Clinical Intake:  Pre-visit preparation completed: Yes  Pain : No/denies pain     Nutritional Status: BMI of 19-24  Normal Nutritional Risks: None Diabetes: Yes CBG done?: No Did pt. bring in CBG monitor from home?: No (phone visit)  How often do  you need to have someone help you when you read instructions, pamphlets, or other written materials from your doctor or pharmacy?: 1 - Never What is the last grade level you completed in school?: 10th grade  Diabetes:  Is the patient diabetic?  Yes  If diabetic, was a CBG obtained today?  No  Did the patient bring in their glucometer from home?  No phone visit How often do you monitor your CBG's? occasionally.   Financial Strains and Diabetes Management:  Are you having any financial strains with the device, your supplies or your medication? No .  Does the patient want to be seen by Chronic Care Management for management of their diabetes?  No  Would the patient like to be referred to a Nutritionist or for Diabetic Management?  No   Diabetic Exams:  Diabetic Eye Exam:. Overdue for diabetic eye exam. Pt has been advised about the importance in completing this exam. Patient plans to make his own  appt  Diabetic Foot Exam: Pt has been advised about the importance in completing this exam. To be completed by PCP    Interpreter Needed?: No  Information entered by :: Caroleen Hamman LPN   Activities of Daily Living In your present state of health, do you have any difficulty performing the following activities: 10/16/2020  Hearing? Y  Comment right ear  Vision? N  Difficulty concentrating or making decisions? N  Walking or climbing stairs? N  Dressing or bathing? N  Doing errands, shopping? N  Preparing Food and eating ? N  Using the Toilet? N  In the past six months, have you accidently leaked urine? N  Do you have problems with loss of bowel control? N  Managing your Medications? N  Managing your Finances? N  Housekeeping or managing your Housekeeping? N  Some recent data might be hidden    Patient Care Team: Nche, Charlene Brooke, NP as PCP - General (Internal Medicine)  Indicate any recent Medical Services you may have received from other than [facility] providers in the past year (date may be approximate).     Assessment:   This is a routine wellness examination for Teagan.  Hearing/Vision screen  Hearing Screening   '125Hz'$  $[place]o'250Hz'WAJNC$'500Hz'$'1000Hz'$'2000Hz'$'3000Hz'$'4000Hz'$'6000Hz'$'8000Hz'$   Right ear:           Left ear:           Comments: Hearing loss in right ear  Vision Screening Comments: Last eye exam-unknown   Dietary issues and exercise activities discussed: Current Exercise Habits: Home exercise routine, Type of exercise: walking, Time (Minutes): 20, Frequency (Times/Week): 2, Weekly Exercise (Minutes/Week): 40, Intensity: Mild, Exercise limited by: None identified  Goals     Patient Stated     Start walking more      Depression Screen PHQ 2/9 Scores 10/16/2020 06/27/2020 06/27/2020 10/30/2019 02/20/2019 11/21/2018 04/26/2018  PHQ - 2 Score 0 0 0 0 0 0 0  PHQ- 9 Score - 0 - 0 - 0 -    Fall Risk Fall Risk  10/16/2020 06/27/2020 02/20/2019 11/21/2018 04/26/2018   Falls in the past year? 0 0 0 0 No  Number falls in past yr: 0 0 - 0 -  Injury with Fall? 0 0 - 0 -  Risk for fall due to : - - - Impaired balance/gait -  Follow up Falls prevention discussed - - Falls evaluation completed -    Any stairs in or around the home? Yes  If so,  are there any without handrails? No  Home free of loose throw rugs in walkways, pet beds, electrical cords, etc? Yes  Adequate lighting in your home to reduce risk of falls? Yes   ASSISTIVE DEVICES UTILIZED TO PREVENT FALLS:  Life alert? No  Use of a cane, walker or w/c? Yes  Grab bars in the bathroom? Yes  Shower chair or bench in shower? No  Elevated toilet seat or a handicapped toilet? No   TIMED UP AND GO:  Was the test performed? No . Phone visit   Cognitive Function:No cognitive impairment noted        Immunizations Immunization History  Administered Date(s) Administered   Fluad Quad(high Dose 65+) 10/30/2019    TDAP status: Due, Education has been provided regarding the importance of this vaccine. Advised may receive this vaccine at local pharmacy or Health Dept. Aware to provide a copy of the vaccination record if obtained from local pharmacy or Health Dept. Verbalized acceptance and understanding.   Flu vaccine status: Due- Patient plans to obtain vaccine soon  Pneumococcal vaccine status: Due,TDAP status: Due, Education has been provided regarding the importance of this vaccine. Patient to discuss with PCP at next office visit.  Covid-19 vaccine status: Declined, Education has been provided regarding the importance of this vaccine but patient still declined. Advised may receive this vaccine at local pharmacy or Health Dept.or vaccine clinic. Aware to provide a copy of the vaccination record if obtained from local pharmacy or Health Dept. Verbalized acceptance and understanding.  Qualifies for Shingles Vaccine? Yes   Zostavax completed No   Shingrix Completed?: No.    Education has been  provided regarding the importance of this vaccine. Patient has been advised to call insurance company to determine out of pocket expense if they have not yet received this vaccine. Advised may also receive vaccine at local pharmacy or Health Dept. Verbalized acceptance and understanding.  Screening Tests Health Maintenance  Topic Date Due   OPHTHALMOLOGY EXAM  Never done   TETANUS/TDAP  Never done   PNA vac Low Risk Adult (1 of 2 - PCV13) Never done   FOOT EXAM  04/27/2019   INFLUENZA VACCINE  07/06/2020   COVID-19 Vaccine (1) 11/01/2020 (Originally 07/15/1963)   HEMOGLOBIN A1C  12/28/2020   Fecal DNA (Cologuard)  03/04/2022   Hepatitis C Screening  Completed    Health Maintenance  Health Maintenance Due  Topic Date Due   OPHTHALMOLOGY EXAM  Never done   TETANUS/TDAP  Never done   PNA vac Low Risk Adult (1 of 2 - PCV13) Never done   FOOT EXAM  04/27/2019   INFLUENZA VACCINE  07/06/2020    Colorectal cancer screening: Completed Cologuard 03/05/2019. Repeat every 3 years  Lung Cancer Screening: (Low Dose CT Chest recommended if Age 80-80 years, 30 pack-year currently smoking OR have quit w/in 15years.) does not qualify.     Additional Screening:  Hepatitis C Screening:Completed 04/26/2018  Vision Screening: Recommended annual ophthalmology exams for early detection of glaucoma and other disorders of the eye. Is the patient up to date with their annual eye exam?  No  Who is the provider or what is the name of the office in which the patient attends annual eye examsUnknown If pt is not established with a provider, would they like to be referred to a provider to establish care? No . Patient plans to make an appt.  Dental Screening: Recommended annual dental exams for proper oral hygiene  Community Resource Referral /  Chronic Care Management: CRR required this visit?  No   CCM required this visit?  No      Plan:     I have personally reviewed and noted the  following in the patients chart:    Medical and social history  Use of alcohol, tobacco or illicit drugs   Current medications and supplements  Functional ability and status  Nutritional status  Physical activity  Advanced directives  List of other physicians  Hospitalizations, surgeries, and ER visits in previous 12 months  Vitals  Screenings to include cognitive, depression, and falls  Referrals and appointments  In addition, I have reviewed and discussed with patient certain preventive protocols, quality metrics, and best practice recommendations. A written personalized care plan for preventive services as well as general preventive health recommendations were provided to patient.   Due to this being a telephonic visit, the after visit summary with patients personalized plan was offered to patient via mail or my-chart.  per request, patient was mailed a copy of [place], LPN   [id number]  Nurse Health Advisor  Nurse Notes: None

## 2020-11-12 ENCOUNTER — Telehealth: Payer: Self-pay | Admitting: Nurse Practitioner

## 2020-11-12 NOTE — Progress Notes (Signed)
  Chronic Care Management   Outreach Note  11/12/2020 Name: Shyne Lehrke MRN: [id number] DOB: [birth year]  Referred by: Flossie Buffy, NP Reason for referral : No chief complaint on file.   An unsuccessful telephone outreach was attempted today. The patient was referred to the pharmacist for assistance with care management and care coordination.   Follow Up Plan:   Carley Perdue [employer] Scheduler

## 2020-11-26 ENCOUNTER — Other Ambulatory Visit: Payer: Self-pay | Admitting: Nurse Practitioner

## 2020-11-26 DIAGNOSIS — E1151 Type 2 diabetes mellitus with diabetic peripheral angiopathy without gangrene: Secondary | ICD-10-CM

## 2020-11-27 NOTE — Telephone Encounter (Signed)
Last OV 06/27/20 Last fill 06/30/20  #180/1

## 2020-12-16 ENCOUNTER — Other Ambulatory Visit: Payer: Self-pay

## 2020-12-16 DIAGNOSIS — E1151 Type 2 diabetes mellitus with diabetic peripheral angiopathy without gangrene: Secondary | ICD-10-CM

## 2020-12-16 DIAGNOSIS — F3342 Major depressive disorder, recurrent, in full remission: Secondary | ICD-10-CM

## 2020-12-16 DIAGNOSIS — E781 Pure hyperglyceridemia: Secondary | ICD-10-CM

## 2020-12-16 DIAGNOSIS — I701 Atherosclerosis of renal artery: Secondary | ICD-10-CM

## 2020-12-16 DIAGNOSIS — I10 Essential (primary) hypertension: Secondary | ICD-10-CM

## 2020-12-16 MED ORDER — SERTRALINE HCL 100 MG PO TABS: 100.0000 mg | ORAL_TABLET | Freq: Every day | ORAL | 3 refills | Status: DC

## 2020-12-16 MED ORDER — AMLODIPINE BESYLATE 5 MG PO TABS: 5.0000 mg | ORAL_TABLET | Freq: Every day | ORAL | 3 refills | Status: DC

## 2020-12-16 MED ORDER — ATORVASTATIN CALCIUM 40 MG PO TABS: 40.0000 mg | ORAL_TABLET | Freq: Every day | ORAL | 3 refills | Status: DC

## 2020-12-16 MED ORDER — OLMESARTAN MEDOXOMIL 40 MG PO TABS
40.0000 mg | ORAL_TABLET | Freq: Every day | ORAL | 1 refills | Status: DC
Start: 2020-12-16 — End: 2021-05-28

## 2020-12-16 MED ORDER — ATORVASTATIN CALCIUM 40 MG PO TABS: 40.0000 mg | ORAL_TABLET | Freq: Every day | ORAL | 1 refills | Status: DC

## 2020-12-16 MED ORDER — METFORMIN HCL 850 MG PO TABS: 850.0000 mg | ORAL_TABLET | Freq: Two times a day (BID) | ORAL | 1 refills | Status: DC

## 2020-12-16 MED ORDER — OLMESARTAN MEDOXOMIL 40 MG PO TABS: 40.0000 mg | ORAL_TABLET | Freq: Every day | ORAL | 3 refills | Status: DC

## 2020-12-16 MED ORDER — SERTRALINE HCL 100 MG PO TABS: 100.0000 mg | ORAL_TABLET | Freq: Every day | ORAL | 1 refills | Status: DC

## 2020-12-16 MED ORDER — AMLODIPINE BESYLATE 5 MG PO TABS: 5.0000 mg | ORAL_TABLET | Freq: Every day | ORAL | 1 refills | Status: DC

## 2020-12-16 NOTE — Telephone Encounter (Signed)
Pt has switched his pharmacy and would like prescriptions sent to [facility].

## 2020-12-16 NOTE — Addendum Note (Signed)
Addended by: Wilfred Lacy L on: 12/16/2020 01:46 PM   Modules accepted: Orders

## 2021-02-03 ENCOUNTER — Telehealth: Payer: Self-pay | Admitting: Nurse Practitioner

## 2021-02-03 NOTE — Progress Notes (Signed)
  Chronic Care Management   Outreach Note  02/03/2021 Name: Jordan Arellano MRN: [id number] DOB: 12/28/1950  Referred by: Flossie Buffy, NP Reason for referral : No chief complaint on file.   An unsuccessful telephone outreach was attempted today. The patient was referred to the pharmacist for assistance with care management and care coordination.   Follow Up Plan:   Carley Perdue [employer] Scheduler

## 2021-02-06 ENCOUNTER — Telehealth: Payer: Self-pay | Admitting: Nurse Practitioner

## 2021-02-06 NOTE — Progress Notes (Signed)
  Chronic Care Management   Note  02/06/2021 Name: Clemence Stillings MRN: [id number] DOB: 11-17-1951  Qusay Villada is a 70 y.o. year old male who is a primary care patient of Nche, Charlene Brooke, NP. I reached out to [name] by phone today in response to a referral sent by Mr. Tadhg Felan's PCP, Nche, Charlene Brooke, NP.   Mr. Cornfield was given information about Chronic Care Management services today including:  1. CCM service includes personalized support from designated clinical staff supervised by his physician, including individualized plan of care and coordination with other care providers 2. 24/7 contact phone numbers for assistance for urgent and routine care needs. 3. Service will only be billed when office clinical staff spend 20 minutes or more in a month to coordinate care. 4. Only one practitioner may furnish and bill the service in a calendar month. 5. The patient may stop CCM services at any time (effective at the end of the month) by phone call to the office staff.   Patient agreed to services and verbal consent obtained.   Follow up plan:   Carley Perdue [employer] Scheduler

## 2021-02-13 ENCOUNTER — Telehealth: Payer: Self-pay

## 2021-02-13 NOTE — Progress Notes (Signed)
    Chronic Care Management Pharmacy Assistant   Name: Jordan Arellano  MRN: [id number] DOB: 04-09-1951   Reason for Encounter: Medication Review/ initial question for initial visit with clinical pharmacist on 02/17/2021.   Hospital visits:  None in previous 6 months  Have you seen any other providers since your last visit? no Any changes in your medications or health? no Any side effects from any medications? no Do you have an symptoms or problems not managed by your medications? no Any concerns about your health right now? no Has your provider asked that you check blood pressure, blood sugar, or follow special diet at home? Yes  Patient states he checks his blood pressure at home and blood sugar.  Patient states he does not follow a special diet.  Do you get any type of exercise on a regular basis? Yes  Patient [place]. Can you think of a goal you would like to reach for your health? None ID  Do you have any problems getting your medications? no Is there anything that you would like to discuss during the appointment?   Patient states he is unsure what he is taking and what the medication is for.  Please bring medications and supplements to appointment   Medications: Outpatient Encounter Medications as of 02/13/2021  Medication Sig  . amLODipine (NORVASC) 5 MG tablet Take 1 tablet (5 mg total) by mouth daily. Need office visit for additional refills  . aspirin 81 MG tablet Take 81 mg by mouth daily.  Marland Kitchen atorvastatin (LIPITOR) 40 MG tablet Take 1 tablet (40 mg total) by mouth daily at 6 PM. Need office visit for additional refills  . blood glucose meter kit and supplies KIT Dispense based on patient and insurance preference. Use two times daily as directed (before breakfast and at bedtime. (FOR ICD-10: E08.42)  . fenofibrate (TRICOR) 145 MG tablet Take 1 tablet (145 mg total) by mouth daily.  . metFORMIN (GLUCOPHAGE) 850 MG  tablet Take 1 tablet (850 mg total) by mouth 2 (two) times daily with a meal. Need office visit for additional refills  . olmesartan (BENICAR) 40 MG tablet Take 1 tablet (40 mg total) by mouth daily. Need office visit for additional refills  . sertraline (ZOLOFT) 100 MG tablet Take 1 tablet (100 mg total) by mouth daily. Need office visit for additional refills   No facility-administered encounter medications on file as of 02/13/2021.    Star Rating Drugs:Atorvastatin, metformin,olmesartan.  [facility] Pharmacist Assistant [phone]

## 2021-02-16 NOTE — Progress Notes (Deleted)
Chronic Care Management Pharmacy Note  02/16/2021 Name:  Jordan Arellano MRN:  [id number] DOB:  1951/05/31  Subjective: Jordan Arellano is an 70 y.o. year old male who is a primary patient of Nche, Charlene Brooke, NP.  The CCM team was consulted for assistance with disease management and care coordination needs.    Engaged with patient by telephone for initial visit in response to provider referral for pharmacy case management and/or care coordination services.   Consent to Services:  The patient was given the following information about Chronic Care Management services today, agreed to services, and gave verbal consent: 1. CCM service includes personalized support from designated clinical staff supervised by the primary care provider, including individualized plan of care and coordination with other care providers 2. 24/7 contact phone numbers for assistance for urgent and routine care needs. 3. Service will only be billed when office clinical staff spend 20 minutes or more in a month to coordinate care. 4. Only one practitioner may furnish and bill the service in a calendar month. 5.The patient may stop CCM services at any time (effective at the end of the month) by phone call to the office staff. 6. The patient will be responsible for cost sharing (co-pay) of up to 20% of the service fee (after annual deductible is met). Patient agreed to services and consent obtained.  Patient Care Team: Nche, Charlene Brooke, NP as PCP - General (Internal Medicine) Germaine Pomfret, [facility] as Pharmacist (Pharmacist)  Recent office visits: 10/16/20: Patient presented to Caroleen Hamman, LPN for AWV.  05/11/29: Patient presented to Wilfred Lacy, NP for follow-up. A1c stable at 6.7%. LDL 81. Atorvastatin increased to 40 mg daily.   Recent consult visits: None in previous 6 months  Hospital visits: None in previous 6 months  Objective:  Lab Results  Component Value Date   CREATININE 1.00 06/27/2020   BUN  15 06/27/2020   GFR 74.32 06/27/2020   GFRNONAA >60 10/02/2017   GFRAA >60 10/02/2017   NA 140 06/27/2020   K 3.9 06/27/2020   CALCIUM 9.1 06/27/2020   CO2 26 06/27/2020    Lab Results  Component Value Date/Time   HGBA1C 6.7 (H) 06/27/2020 10:51 AM   HGBA1C 6.8 (H) 10/30/2019 10:21 AM   HGBA1C 7.3 02/03/2016 12:00 AM   HGBA1C 7.3 02/03/2016 12:00 AM   GFR 74.32 06/27/2020 10:51 AM   GFR 45.59 (L) 10/30/2019 10:21 AM   MICROALBUR 5.4 (H) 02/20/2019 10:35 AM   MICROALBUR 34.2 (H) 04/26/2018 11:42 AM    Last diabetic Eye exam: No results found for: HMDIABEYEEXA  Last diabetic Foot exam: No results found for: HMDIABFOOTEX   Lab Results  Component Value Date   CHOL 161 06/27/2020   HDL 29.10 (L) 06/27/2020   LDLCALC UNABLE TO CALCULATE IF TRIGLYCERIDE OVER 400 mg/dL 03/27/2014   LDLDIRECT 81.0 06/27/2020   TRIG 296.0 (H) 06/27/2020   CHOLHDL 6 06/27/2020    Hepatic Function Latest Ref Rng & Units 06/27/2020 02/20/2019 11/21/2018  Total Protein 6.0 - 8.3 g/dL 6.9 7.4 7.2  Albumin 3.5 - 5.2 g/dL 4.1 4.4 4.3  AST 0 - 37 U/L _0 ALT 0 - 53 U/L _1 Alk Phosphatase 39 - 117 U/L 64 56 59  Total Bilirubin 0.2 - 1.2 mg/dL 0.4 0.4 0.4  Bilirubin, Direct 0.0 - 0.3 mg/dL 0.1 0.1 -    Lab Results  Component Value Date/Time   TSH 2.50 11/21/2018 10:49 AM   TSH 2.91  02/03/2016 12:00 AM    CBC Latest Ref Rng & Units 06/27/2020 11/21/2018 10/04/2017  WBC 4.0 - 10.5 K/uL 9.9 12.2(H) 9.5  Hemoglobin 13.0 - 17.0 g/dL 13.1 13.5 9.0(L)  Hematocrit 39.0 - 52.0 % 39.4 39.7 28.0(L)  Platelets 150.0 - 400.0 K/uL 282.0 342.0 161    No results found for: VD25OH  Clinical ASCVD: Yes  The ASCVD Risk score Mikey Bussing DC Jr., et al., 2013) failed to calculate for the following reasons:   The patient has a prior MI or stroke diagnosis    Depression screen [facility] 2/9 10/16/2020 06/27/2020 06/27/2020  Decreased Interest 0 0 0  Down, Depressed, Hopeless 0 0 0  PHQ - 2 Score 0 0 0  Altered  sleeping - 0 -  Tired, decreased energy - 0 -  Change in appetite - 0 -  Feeling bad or failure about yourself  - 0 -  Trouble concentrating - 0 -  Moving slowly or fidgety/restless - 0 -  Suicidal thoughts - 0 -  PHQ-9 Score - 0 -     Social History   Tobacco Use  Smoking Status Current Some Day Smoker  . Packs/day: 0.50  . Years: 35.00  . Pack years: 17.50  . Types: Cigarettes  Smokeless Tobacco Never Used   BP Readings from Last 3 Encounters:  06/27/20 128/68  10/30/19 130/78  02/20/19 (!) 146/86   Pulse Readings from Last 3 Encounters:  06/27/20 72  10/30/19 70  02/20/19 76   Wt Readings from Last 3 Encounters:  10/16/20 162 lb (73.5 kg)  06/27/20 162 lb (73.5 kg)  10/30/19 166 lb 9.6 oz (75.6 kg)    Assessment/Interventions: Review of patient past medical history, allergies, medications, health status, including review of consultants reports, laboratory and other test data, was performed as part of comprehensive evaluation and provision of chronic care management services.   SDOH:  (Social Determinants of Health) assessments and interventions performed: Yes   CCM Care Plan  No Known Allergies  Medications Reviewed Today    Reviewed by Marta Antu, LPN (Licensed Practical Nurse) on 10/16/20 at 1248  Med List Status: <None>  Medication Order Taking? Sig Documenting Provider Last Dose Status Informant  amLODipine (NORVASC) 5 MG tablet [id number]  Take 1 tablet (5 mg total) by mouth daily. Flossie Buffy, NP  Active   aspirin 81 MG tablet [id number]  Take 81 mg by mouth daily. [provider]  Active Self  atorvastatin (LIPITOR) 40 MG tablet [id number]  Take 1 tablet (40 mg total) by mouth daily at 6 PM. Nche, Charlene Brooke, NP  Active   blood glucose meter kit and supplies KIT [id number]  Dispense based on patient and insurance preference. Use two times daily as directed (before breakfast and at bedtime. (FOR ICD-10: E08.42) Nche, Charlene Brooke, NP   Active Self  fenofibrate (TRICOR) 145 MG tablet [id number]  Take 1 tablet (145 mg total) by mouth daily. Nche, Charlene Brooke, NP  Active   metFORMIN (GLUCOPHAGE) 850 MG tablet [id number]  Take 1 tablet (850 mg total) by mouth 2 (two) times daily with a meal. Nche, Charlene Brooke, NP  Active   olmesartan (BENICAR) 40 MG tablet [id number]  Take 1 tablet (40 mg total) by mouth daily. Flossie Buffy, NP  Active   sertraline (ZOLOFT) 100 MG tablet [id number]  Take 1 tablet (100 mg total) by mouth daily. Nche, Charlene Brooke, NP  Active           Patient  Active Problem List   Diagnosis Date Noted  . Left renal artery stenosis ([place]) 06/27/2020  . Recurrent major depressive disorder, in full remission ([place]) 06/27/2020  . Stage 3a chronic kidney disease ([place]) 10/30/2019  . Hypertrophic toenail 10/30/2019  . PAD (peripheral artery disease) ([place]) 10/01/2017  . Status post femoral-popliteal bypass surgery 09/30/2017  . DM (diabetes mellitus) with peripheral vascular complication ([place]) 48/54/6270  . Foot ulcer, left ([place]) 12/12/2016  . Foot ulcer ([place]) 12/12/2016  . Hypertriglyceridemia 10/01/2016  . S/P unilateral BKA (below knee amputation), right ([place]) 09/30/2016  . Neuropathy 09/30/2016  . Vascular disease, peripheral ([place]) 09/30/2016  . Phantom pain after amputation of lower extremity ([place]) 09/30/2016  . CVA (cerebral vascular accident) ([place]) 03/25/2014  . Essential hypertension 03/25/2014  . Tobacco abuse 03/25/2014    Immunization History  Administered Date(s) Administered  . Fluad Quad(high Dose 65+) 10/30/2019    Conditions to be addressed/monitored:  Hypertension, Hyperlipidemia, Diabetes, Chronic Kidney Disease, Depression and Tobacco use  There are no care plans that you recently modified to display for this patient.    Medication Assistance: {MEDASSISTANCEINFO:25044}  Patient's preferred pharmacy is:  [facility], [place], [address] [place], [address] [place] [address] Phone: [phone] Fax: [phone]  Uses pill box? {Yes or If no, why not?:20788} Pt endorses ***% compliance  We discussed: {Pharmacy options:24294} Patient decided to: {US Pharmacy Plan:23885}  Care Plan and Follow Up Patient Decision:  {FOLLOWUP:24991}  Plan: {CM FOLLOW UP PLAN:25073}  ***   Current Barriers:  . {pharmacybarriers:24917} . ***  Pharmacist Clinical Goal(s):  . patient will {PHARMACYGOALCHOICES:24921} through collaboration with PharmD and provider.  . ***  Interventions: . 1:1 collaboration with Nche, Charlene Brooke, NP regarding development and update of comprehensive plan of care as evidenced by provider attestation and co-signature . Inter-disciplinary care team collaboration (see longitudinal plan of care) . Comprehensive medication review performed; medication list updated in electronic medical record  Hypertension (BP goal <130/80) -Controlled -Current treatment: . Amlodipine 5 mg daily . Olmesartan 40 mg daily  -Medications previously tried: ***  -Current home readings: *** -Current dietary habits: *** -Current exercise habits: *** -{ACTIONS;DENIES/REPORTS:21021675::"Denies"} hypotensive/hypertensive symptoms -Educated on {CCM BP Counseling:25124} -Counseled to monitor BP at home ***, document, and provide log at future appointments -{CCMPHARMDINTERVENTION:25122}  Hyperlipidemia: (LDL goal < 70) -History of CVA  -Uncontrolled -Current treatment: . Atorvastatin 40 mg daily  . Fenofibrate 145 mg daily -Medications previously tried: ***  -Current dietary patterns: *** -Current exercise habits: *** -Educated on {CCM HLD Counseling:25126} -{CCMPHARMDINTERVENTION:25122}  Diabetes (A1c goal <7%) -Controlled -Current medications: Marland Kitchen Metformin 850 mg twice daily  -Medications previously tried: ***  -Current home glucose readings . fasting glucose: *** . post prandial glucose:  *** -{ACTIONS;DENIES/REPORTS:21021675::"Denies"} hypoglycemic/hyperglycemic symptoms -Current meal patterns:  . breakfast: ***  . lunch: ***  . dinner: *** . snacks: *** . drinks: *** -Current exercise: *** -Educated on {CCM DM COUNSELING:25123} -Counseled to check feet daily and get yearly eye exams -{CCMPHARMDINTERVENTION:25122}  Depression/Anxiety (Goal: ***) -Controlled -Current treatment: . Sertraline 100 mg daily  -Medications previously tried/failed: *** -PHQ9: 0 -GAD7: 0 -Connected with *** for mental health support -Educated on {CCM mental health counseling:25127} -{CCMPHARMDINTERVENTION:25122}  Tobacco use (Goal ***) -{US controlled/uncontrolled:25276} -Previous quit attempts: *** -Current treatment  . *** -Patient smokes {Time to first cigarette:23873} -Patient triggers include: {Smoking Triggers:23882} -On a scale of 1-10, reports MOTIVATION to quit is *** -On a scale of 1-10, reports  CONFIDENCE in quitting is *** -{Smoking Cessation Counseling:23883} -{CCMPHARMDINTERVENTION:25122}   Patient Goals/Self-Care Activities . patient will:  - {pharmacypatientgoals:24919}  Follow Up Plan: {CM FOLLOW UP CFPF:78776}

## 2021-02-17 ENCOUNTER — Telehealth: Payer: Medicare HMO

## 2021-02-17 NOTE — Addendum Note (Signed)
Addended by: Darral Dash on: 02/17/2021 10:08 AM   Modules accepted: Orders

## 2021-04-13 ENCOUNTER — Other Ambulatory Visit: Payer: Self-pay | Admitting: Nurse Practitioner

## 2021-04-13 DIAGNOSIS — E1151 Type 2 diabetes mellitus with diabetic peripheral angiopathy without gangrene: Secondary | ICD-10-CM

## 2021-05-01 DIAGNOSIS — I10 Essential (primary) hypertension: Secondary | ICD-10-CM | POA: Diagnosis not present

## 2021-05-01 DIAGNOSIS — E119 Type 2 diabetes mellitus without complications: Secondary | ICD-10-CM | POA: Diagnosis not present

## 2021-05-08 ENCOUNTER — Other Ambulatory Visit: Payer: Self-pay | Admitting: Nurse Practitioner

## 2021-05-08 DIAGNOSIS — E1151 Type 2 diabetes mellitus with diabetic peripheral angiopathy without gangrene: Secondary | ICD-10-CM

## 2021-05-26 ENCOUNTER — Other Ambulatory Visit: Payer: Self-pay

## 2021-05-26 ENCOUNTER — Ambulatory Visit (INDEPENDENT_AMBULATORY_CARE_PROVIDER_SITE_OTHER): Payer: Medicare Other | Admitting: Nurse Practitioner

## 2021-05-26 ENCOUNTER — Telehealth: Payer: Self-pay

## 2021-05-26 ENCOUNTER — Encounter: Payer: Self-pay | Admitting: Nurse Practitioner

## 2021-05-26 VITALS — BP 122/60 | HR 71 | Temp 98.5°F | Ht 73.0 in | Wt 154.0 lb

## 2021-05-26 DIAGNOSIS — Z23 Encounter for immunization: Secondary | ICD-10-CM | POA: Diagnosis not present

## 2021-05-26 DIAGNOSIS — I739 Peripheral vascular disease, unspecified: Secondary | ICD-10-CM | POA: Diagnosis not present

## 2021-05-26 DIAGNOSIS — N1831 Chronic kidney disease, stage 3a: Secondary | ICD-10-CM

## 2021-05-26 DIAGNOSIS — E1151 Type 2 diabetes mellitus with diabetic peripheral angiopathy without gangrene: Secondary | ICD-10-CM

## 2021-05-26 DIAGNOSIS — E781 Pure hyperglyceridemia: Secondary | ICD-10-CM

## 2021-05-26 DIAGNOSIS — F3342 Major depressive disorder, recurrent, in full remission: Secondary | ICD-10-CM

## 2021-05-26 DIAGNOSIS — G546 Phantom limb syndrome with pain: Secondary | ICD-10-CM | POA: Diagnosis not present

## 2021-05-26 DIAGNOSIS — Z720 Tobacco use: Secondary | ICD-10-CM

## 2021-05-26 DIAGNOSIS — I10 Essential (primary) hypertension: Secondary | ICD-10-CM

## 2021-05-26 LAB — MICROALBUMIN / CREATININE URINE RATIO
Creatinine,U: 118.3 mg/dL
Microalb Creat Ratio: 12.7 mg/g (ref 0.0–30.0)
Microalb, Ur: 15 mg/dL — ABNORMAL HIGH (ref 0.0–1.9)

## 2021-05-26 LAB — HEPATIC FUNCTION PANEL
ALT: 6 U/L (ref 0–53)
AST: 9 U/L (ref 0–37)
Albumin: 4.1 g/dL (ref 3.5–5.2)
Alkaline Phosphatase: 67 U/L (ref 39–117)
Bilirubin, Direct: 0.1 mg/dL (ref 0.0–0.3)
Total Bilirubin: 0.4 mg/dL (ref 0.2–1.2)
Total Protein: 6.8 g/dL (ref 6.0–8.3)

## 2021-05-26 LAB — LIPID PANEL
Cholesterol: 119 mg/dL (ref 0–200)
HDL: 25.6 mg/dL — ABNORMAL LOW (ref >39.00)
NonHDL: 93.76
Total CHOL/HDL Ratio: 5
Triglycerides: 378 mg/dL — ABNORMAL HIGH (ref 0.0–149.0)
VLDL: 75.6 mg/dL — ABNORMAL HIGH (ref 0.0–40.0)

## 2021-05-26 LAB — BASIC METABOLIC PANEL
BUN: 15 mg/dL (ref 6–23)
CO2: 26 mEq/L (ref 19–32)
Calcium: 9.1 mg/dL (ref 8.4–10.5)
Chloride: 103 mEq/L (ref 96–112)
Creatinine, Ser: 0.81 mg/dL (ref 0.40–1.50)
GFR: 90.37 mL/min (ref >60.00)
Glucose, Bld: 121 mg/dL — ABNORMAL HIGH (ref 70–99)
Potassium: 4 mEq/L (ref 3.5–5.1)
Sodium: 138 mEq/L (ref 135–145)

## 2021-05-26 LAB — LDL CHOLESTEROL, DIRECT: Direct LDL: 43 mg/dL

## 2021-05-26 LAB — HEMOGLOBIN A1C: Hgb A1c MFr Bld: 6.3 % (ref 4.6–6.5)

## 2021-05-26 NOTE — Progress Notes (Signed)
Subjective:  Patient ID: Jordan Arellano, male    DOB: 09-30-1951  Age: 70 y.o. MRN: [id number]  CC: Follow-up (DM, HTN and hyperlipidemia f/up)  HPI  Tobacco abuse Continues to smoke 1/2ppd even though he is aware of its effects on health. Reports he has not thought of quitting.  Stage 3a chronic kidney disease Repeat BMP No LE edema, BP at goal  DM (diabetes mellitus) with peripheral vascular complication (HCC) Repeat HgbA1c and urine microalbumin Does not check glucose at home Reports he is compliant with metformin  Recurrent major depressive disorder, in full remission (HCC) Stable mood with zoloft  Foot ulcer, left (HCC) Resolved Referred to podiatry for nail trimming Advised about need for adequate foot care  Hypertriglyceridemia Repeat lipid panel Advised to quit tobacco use  Essential hypertension BP at goal BP Readings from Last 3 Encounters:  05/26/21 122/60  06/27/20 128/68  10/30/19 130/78   Repeat BMP  Depression screen [facility] 2/9 05/26/2021 10/16/2020 06/27/2020  Decreased Interest 0 0 0  Down, Depressed, Hopeless 0 0 0  PHQ - 2 Score 0 0 0  Altered sleeping 0 - 0  Tired, decreased energy 0 - 0  Change in appetite 0 - 0  Feeling bad or failure about yourself  0 - 0  Trouble concentrating 0 - 0  Moving slowly or fidgety/restless 0 - 0  Suicidal thoughts 0 - 0  PHQ-9 Score 0 - 0  Difficult doing work/chores Not difficult at all - -    Reviewed past Medical, Social and Family history today.  Outpatient Medications Prior to Visit  Medication Sig Dispense Refill   amLODipine (NORVASC) 5 MG tablet Take 1 tablet (5 mg total) by mouth daily. Need office visit for additional refills 90 tablet 1   aspirin 81 MG tablet Take 81 mg by mouth daily.     atorvastatin (LIPITOR) 40 MG tablet Take 1 tablet (40 mg total) by mouth daily at 6 PM. Need office visit for additional refills 90 tablet 1   blood glucose meter kit and supplies KIT Dispense based on patient  and insurance preference. Use two times daily as directed (before breakfast and at bedtime. (FOR ICD-10: E08.42) 1 each 2   fenofibrate (TRICOR) 145 MG tablet Take 1 tablet (145 mg total) by mouth daily. 90 tablet 3   metFORMIN (GLUCOPHAGE) 850 MG tablet Take 1 tablet (850 mg total) by mouth 2 (two) times daily with a meal. Need office visit for additional refills 180 tablet 1   olmesartan (BENICAR) 40 MG tablet Take 1 tablet (40 mg total) by mouth daily. Need office visit for additional refills 90 tablet 1   sertraline (ZOLOFT) 100 MG tablet Take 1 tablet (100 mg total) by mouth daily. Need office visit for additional refills 90 tablet 1   No facility-administered medications prior to visit.    ROS See HPI  Objective:  BP 122/60   Pulse 71   Temp 98.5 F (36.9 C)   Ht 6\' 1"  (1.854 m)   Wt 154 lb (69.9 kg)   SpO2 97%   BMI 20.32 kg/m   Physical Exam Vitals reviewed.  Cardiovascular:     Rate and Rhythm: Normal rate and regular rhythm.     Pulses: Normal pulses.     Heart sounds: Normal heart sounds.  Pulmonary:     Effort: Pulmonary effort is normal.     Breath sounds: Normal breath sounds.  Skin:    General: Skin is warm and dry.  Findings: No erythema or rash.     Comments: Completed DM foot exam on Left foot only. S/p right BKA with prothesis. Skin on stump is dry and intact.  Neurological:     Mental Status: He is alert and oriented to person, place, and time.  Psychiatric:        Mood and Affect: Mood normal.        Behavior: Behavior normal.        Thought Content: Thought content normal.    Assessment & Plan:  This visit occurred during the SARS-CoV-2 public health emergency.  Safety protocols were in place, including screening questions prior to the visit, additional usage of staff PPE, and extensive cleaning of exam room while observing appropriate contact time as indicated for disinfecting solutions.   Devin was seen today for follow-up.  Diagnoses and  all orders for this visit:  DM (diabetes mellitus) with peripheral vascular complication (HCC) -     Hemoglobin A1c -     Microalbumin / creatinine urine ratio -     Basic metabolic panel -     Hepatic function panel -     AMB Referral to Community Care Coordinaton  Essential hypertension -     Basic metabolic panel -     AMB Referral to [facility] Coordinaton  PAD (peripheral artery disease) (HCC) -     Lipid panel -     AMB Referral to Community Care Coordinaton  Stage 3a chronic kidney disease (HCC) -     Basic metabolic panel -     AMB Referral to Community Care Coordinaton  Hypertriglyceridemia -     Lipid panel -     Hepatic function panel -     AMB Referral to Community Care Coordinaton  Recurrent major depressive disorder, in full remission (HCC) -     AMB Referral to Community Care Coordinaton  Tobacco abuse Advised to schedule appt with ophthalmology: Call [facility] care and aschedule an appt as soon as possible: [phone] Advised to schedule appt with podiatry: Call [facility] and schedule an appt as soon as possible: [phone]. Advised to schedule appt for COVID vaccine in 2weeks Provided letter for Prosthesis supplies.  Problem List Items Addressed This Visit       Cardiovascular and Mediastinum   Essential hypertension (Chronic)    BP at goal BP Readings from Last 3 Encounters:  05/26/21 122/60  06/27/20 128/68  10/30/19 130/78   Repeat BMP       Relevant Orders   Basic metabolic panel   AMB Referral to Community Care Coordinaton   DM (diabetes mellitus) with peripheral vascular complication (HCC) - Primary    Repeat HgbA1c and urine microalbumin Does not check glucose at home Reports he is compliant with metformin       Relevant Orders   Hemoglobin A1c   Microalbumin / creatinine urine ratio   Basic metabolic panel   Hepatic function panel   AMB Referral to [facility] Coordinaton   PAD (peripheral artery disease)  (HCC)   Relevant Orders   Lipid panel   AMB Referral to Community Care Coordinaton     Genitourinary   Stage 3a chronic kidney disease (HCC)    Repeat BMP No LE edema, BP at goal       Relevant Orders   Basic metabolic panel   AMB Referral to [facility] Coordinaton     Other   Hypertriglyceridemia (Chronic)  Repeat lipid panel Advised to quit tobacco use       Relevant Orders   Lipid panel   Hepatic function panel   AMB Referral to Community Care Coordinaton   Tobacco abuse (Chronic)    Continues to smoke 1/2ppd even though he is aware of its effects on health. Reports he has not thought of quitting.       Recurrent major depressive disorder, in full remission ([place])    Stable mood with zoloft       Relevant Orders   AMB Referral to [place]    Follow-up: Return in about 6 months (around 11/25/2021) for DM and HTN, hyperlipidemia (43mins, fasting).  Wilfred Lacy, NP

## 2021-05-26 NOTE — Telephone Encounter (Signed)
Jordan Arellano faxed a request for Lidocaine HCL 4% Solution, Lidocaine % Cream, and Alcohol Prep pads.  Order was declined.  Per patient its for his elbow pain. Patient advised to use OTC Diclofenac Gel. Per Baldo Ash NP  Thank you

## 2021-05-26 NOTE — Assessment & Plan Note (Signed)
Continues to smoke 1/2ppd even though he is aware of its effects on health. Reports he has not thought of quitting.

## 2021-05-26 NOTE — Patient Instructions (Addendum)
Go to lab for blood draw and urine collection. Will let you know when letter is [facility]ed [facility].  It is important for you to have a diabetic eye exam once a year. Call [facility] care and aschedule an appt as soon as possible: [phone]  It is important to have your toenails trimmed to avoid developing an ulcers. Call [facility] and schedule an appt as soon as possible: [phone].  [facility] to [facility] or [facility] or [facility] for COVID vaccine in 2weeks.  Foot Care, Adult Foot care is an important part of your health. Finding and addressing potential problems early is the best way to prevent future foot problems. Older adults and those with chronic health conditions, such as diabetes, are more likely todevelop foot problems. Supplies needed: Warm water. Mild soap. Clean towel. Nail clippers. Warren Lacy board or [employer]. Unscented moisturizing lotion or petroleum jelly. Mirror. How to care for your feet Foot hygiene Wash your feet daily with warm (not hot) water and mild soap. Then use a clean towel to dry your feet and the areas between your toes. Do not soak your feet as this can dry your skin. Clip your toenails straight across. They are easier to cut after bathing when nails are slightly softer. Do not dig under them or around the cuticle. File the edges of your nails with an emery board or nail file. Apply unscented moisturizing lotion or petroleum jelly without alcohol to your feet and dry toenails. Do not apply lotion between your toes. Shoes and socks Wear clean socks or stockings every day. Make sure they are not too tight. Wear shoes that fit and have enough cushioning. To break in new shoes, wear them for a few hours a day. This prevents you from injuring your feet. Always look in your shoes before putting them on to be sure there are no objects inside. Wounds, scrapes, corns, and calluses  Check your feet daily for blisters, cuts, and redness. If you cannot see the  bottom of your feet, use a mirror or ask someone for help. Do not cut corns or calluses. Do not try to remove them with medicine. If you find a minor scrape, cut, or break in the skin on your feet, keep the area clean and dry. These areas may be cleaned with mild soap and water. Do not clean the area with peroxide, alcohol, or iodine. If you have a wound, scrape, corn, or callus on your foot, look at it several times a day to make sure it is healing and not infected. Check for: More redness, swelling, or pain. Fluid or blood. Warmth. Pus or a bad smell.  General instructions Do not cross your legs. That may decrease the blood flow to your feet. Do not use heating pads or hot water bottles on your feet. They may burn your skin. If you have lost feeling in your feet or legs, you may not know it is happening until it is too late. Make sure your health care provider does a complete foot exam at least once a year, or more often if you have foot problems. If you have foot problems, report any cuts, sores, or bruises to your health care provider right away. Where to find more information Centers for Disease Control and Prevention: http://www.wolf.info/ Contact a health care provider if: You have a medical condition that increases your risk of infection and you have any cuts, sores, or bruises on your feet. You  have an injury that is not healing. You notice redness on your legs or feet. You feel burning or tingling in your legs or feet. You have pain or cramps in your legs or feet. Your legs or feet are numb. Your feet always feel cold. You have pain around a toenail. Get help right away if: You have a wound, scrape, corn, or callus on your foot and: You have more redness, swelling, or pain. You have fluid or blood. Your wound, scrape, corn, or callus feels warm to the touch. You have pus or a bad smell coming from the wound, scrape, corn, or callus. You have a fever. You have a red line going up your  leg. Summary Foot care is an important part of your health. Finding and addressing potential problems early is the best way to prevent future foot problems. Wash your feet daily with warm (not hot) water and mild soap. Then, use a clean towel to dry your feet and the areas between your toes. Do not soak your feet as this can dry your skin. Clip your toenails straight across. Wear clean socks or stockings every day. Check your feet daily for blisters, cuts, and redness. If you cannot see the bottom of your feet, use a mirror or ask someone for help. This information is not intended to replace advice given to you by your health care provider. Make sure you discuss any questions you have with your healthcare provider. Document Revised: 10/09/2019 Document Reviewed: 10/09/2019 Elsevier Patient Education  [place].

## 2021-05-26 NOTE — Assessment & Plan Note (Signed)
Repeat BMP No LE edema, BP at goal

## 2021-05-26 NOTE — Assessment & Plan Note (Signed)
Repeat lipid panel Advised to quit tobacco use

## 2021-05-26 NOTE — Assessment & Plan Note (Signed)
>>  ASSESSMENT AND PLAN FOR HYPERTRIGLYCERIDEMIA WRITTEN ON 05/26/2021  1:39 PM BY NCHE, CHARLOTTE LUM, NP  Repeat lipid panel Advised to quit tobacco use

## 2021-05-26 NOTE — Assessment & Plan Note (Signed)
Stable mood with zoloft

## 2021-05-26 NOTE — Assessment & Plan Note (Signed)
Resolved Referred to podiatry for nail trimming Advised about need for adequate foot care

## 2021-05-26 NOTE — Assessment & Plan Note (Signed)
>>  ASSESSMENT AND PLAN FOR RECURRENT MAJOR DEPRESSIVE DISORDER, IN FULL REMISSION (HCC) WRITTEN ON 05/26/2021  1:37 PM BY NCHE, CHARLOTTE LUM, NP  Stable mood with zoloft

## 2021-05-26 NOTE — Assessment & Plan Note (Signed)
BP at goal BP Readings from Last 3 Encounters:  05/26/21 122/60  06/27/20 128/68  10/30/19 130/78   Repeat BMP

## 2021-05-26 NOTE — Assessment & Plan Note (Signed)
Repeat HgbA1c and urine microalbumin Does not check glucose at home Reports he is compliant with metformin

## 2021-05-26 NOTE — Assessment & Plan Note (Signed)
Denies any pain.

## 2021-05-28 MED ORDER — AMLODIPINE BESYLATE 5 MG PO TABS: 5.0000 mg | ORAL_TABLET | Freq: Every day | ORAL | 3 refills | Status: DC

## 2021-05-28 MED ORDER — OLMESARTAN MEDOXOMIL 40 MG PO TABS: 40.0000 mg | ORAL_TABLET | Freq: Every day | ORAL | 1 refills | Status: DC

## 2021-05-28 MED ORDER — FENOFIBRATE 145 MG PO TABS: 145.0000 mg | ORAL_TABLET | Freq: Every day | ORAL | 3 refills | Status: DC

## 2021-05-28 MED ORDER — ROSUVASTATIN CALCIUM 40 MG PO TABS: 40.0000 mg | ORAL_TABLET | Freq: Every day | ORAL | 3 refills | Status: DC

## 2021-05-28 MED ORDER — METFORMIN HCL 850 MG PO TABS
850.0000 mg | ORAL_TABLET | Freq: Two times a day (BID) | ORAL | 1 refills | Status: DC
Start: 2021-05-28 — End: 2021-11-04

## 2021-05-28 NOTE — Addendum Note (Signed)
Addended by: Leana Gamer on: 05/28/2021 09:10 PM   Modules accepted: Orders

## 2021-06-05 NOTE — Telephone Encounter (Signed)
Rx filled on 05/28/21 quantity 180 with 2 refills. Office visited needed for additional refills per [facility], last note or previous refills.

## 2021-06-09 ENCOUNTER — Telehealth: Payer: Self-pay | Admitting: *Deleted

## 2021-06-09 NOTE — Chronic Care Management (AMB) (Signed)
  Care Management   Note  06/09/2021 Name: Jordan Arellano MRN: [id number] DOB: Nov 01, 1951  Jordan Arellano is a 70 y.o. year old male who is a primary care patient of Nche, Charlene Brooke, NP and is actively engaged with the care management team. I reached out to [name] by phone today to assist with re-scheduling an initial visit with the Pharmacist  Follow up plan: Unsuccessful telephone outreach attempt made. A HIPAA compliant phone message was left for the patient providing contact information and requesting a return call.  The care management team will reach out to the patient again over the next 7 days.  If patient returns call to provider office, please advise to call [place] at [phone].  [place] Management

## 2021-06-12 NOTE — Chronic Care Management (AMB) (Signed)
  Chronic Care Management   Note  06/12/2021 Name: Tashi Band MRN: [id number] DOB: 14-Dec-1950  Breaker Springer is a 70 y.o. year old male who is a primary care patient of Nche, Charlene Brooke, NP. I reached out to [name] by phone today in response to a referral sent by Mr. Shadi Leatherwood's PCP, Nche, Charlene Brooke, NP.      Mr. Quevedo was given information about Chronic Care Management services today including:  CCM service includes personalized support from designated clinical staff supervised by his physician, including individualized plan of care and coordination with other care providers 24/7 contact phone numbers for assistance for urgent and routine care needs. Service will only be billed when office clinical staff spend 20 minutes or more in a month to coordinate care. Only one practitioner may furnish and bill the service in a calendar month. The patient may stop CCM services at any time (effective at the end of the month) by phone call to the office staff. The patient will be responsible for cost sharing (co-pay) of up to 20% of the service fee (after annual deductible is met).  Patient agreed to services and verbal consent obtained.   Follow up plan: Telephone appointment with care management team member scheduled for:07/07/2021  Darlisha Kelm  Care Guide, Embedded Care Coordination [place]  Care Management

## 2021-07-06 ENCOUNTER — Telehealth: Payer: Self-pay

## 2021-07-06 NOTE — Progress Notes (Signed)
    Chronic Care Management Pharmacy Assistant   Name: Jordan Arellano  MRN: [id number] DOB: Nov 06, 1951  Chart Review for Clinical Pharmacist on 07/07/2021.  Conditions to be addressed/monitored: HTN, DMII, CKD Stage 3, Depression, and Vascular Disease, PAD, Neuropathy,Phantom pain after amputation of lower extremity  Primary concerns for visit include: None ID   Recent office visits:  05/26/2021 Wilfred Lacy NP (PCP) Referred to podiatry for nail trimming,AMB Referral to [place], start Rosuvastatin 40 mg daily,stop Atorvastatin 40 mg  Recent consult visits:  No recent [facility] visits:  None in previous 6 months  Left Voice message to do initial question prior to patient appointment on 07/07/2021 for CCM at 8:30 am with Junius Argyle the Clinical pharmacist.  See note from 02/13/2021.  Medications: Outpatient Encounter Medications as of 07/06/2021  Medication Sig   amLODipine (NORVASC) 5 MG tablet Take 1 tablet (5 mg total) by mouth daily.   aspirin 81 MG tablet Take 81 mg by mouth daily.   blood glucose meter kit and supplies KIT Dispense based on patient and insurance preference. Use two times daily as directed (before breakfast and at bedtime. (FOR ICD-10: E08.42)   fenofibrate (TRICOR) 145 MG tablet Take 1 tablet (145 mg total) by mouth daily.   metFORMIN (GLUCOPHAGE) 850 MG tablet Take 1 tablet (850 mg total) by mouth 2 (two) times daily with a meal. Need office visit for additional refills   olmesartan (BENICAR) 40 MG tablet Take 1 tablet (40 mg total) by mouth daily. Need office visit for additional refills   rosuvastatin (CRESTOR) 40 MG tablet Take 1 tablet (40 mg total) by mouth daily.   sertraline (ZOLOFT) 100 MG tablet Take 1 tablet (100 mg total) by mouth daily. Need office visit for additional refills   No facility-administered encounter medications on file as of 07/06/2021.    Care Gaps: Covid 19  Vaccine Ophthalmology Shingrix Influenza Vaccine Star Rating Drugs: Olmesartan 40 mg last filled 05/13/2021 90 day supply at [facility] Metformin 850 mg  last filled 05/28/2021 90 day supply at [facility] Rosuvastatin 40 mg last filled 05/28/2021 90 day supply at [facility]. Medication Fill Gaps: None ID  [place] Clinical Pharmacist Assistant [phone]

## 2021-07-07 ENCOUNTER — Telehealth: Payer: Medicare Other

## 2021-07-07 NOTE — Progress Notes (Deleted)
Chronic Care Management Pharmacy Note  07/07/2021 Name:  Jordan Arellano MRN:  [id number] DOB:  1951-08-07  Summary: ***  Recommendations/Changes made from today's visit: ***  Plan: ***   Subjective: Jordan Arellano is an 70 y.o. year old male who is a primary patient of Nche, Charlene Brooke, NP.  The CCM team was consulted for assistance with disease management and care coordination needs.    Engaged with patient by telephone for initial visit in response to provider referral for pharmacy case management and/or care coordination services.   Consent to Services:  The patient was given the following information about Chronic Care Management services today, agreed to services, and gave verbal consent: 1. CCM service includes personalized support from designated clinical staff supervised by the primary care provider, including individualized plan of care and coordination with other care providers 2. 24/7 contact phone numbers for assistance for urgent and routine care needs. 3. Service will only be billed when office clinical staff spend 20 minutes or more in a month to coordinate care. 4. Only one practitioner may furnish and bill the service in a calendar month. 5.The patient may stop CCM services at any time (effective at the end of the month) by phone call to the office staff. 6. The patient will be responsible for cost sharing (co-pay) of up to 20% of the service fee (after annual deductible is met). Patient agreed to services and consent obtained.  Patient Care Team: Nche, Charlene Brooke, NP as PCP - General (Internal Medicine) Germaine Pomfret, [facility] as Pharmacist (Pharmacist)  Recent office visits: 05/26/2021 Wilfred Lacy NP (PCP) Referred to podiatry for nail trimming,AMB Referral to [place], start Rosuvastatin 40 mg daily,stop Atorvastatin 40 mg  Recent consult visits: None in previous 6 months  Hospital visits: None in previous 6  months   Objective:  Lab Results  Component Value Date   CREATININE 0.81 05/26/2021   BUN 15 05/26/2021   GFR 90.37 05/26/2021   GFRNONAA >60 10/02/2017   GFRAA >60 10/02/2017   NA 138 05/26/2021   K 4.0 05/26/2021   CALCIUM 9.1 05/26/2021   CO2 26 05/26/2021   GLUCOSE 121 (H) 05/26/2021    Lab Results  Component Value Date/Time   HGBA1C 6.3 05/26/2021 01:33 PM   HGBA1C 6.7 (H) 06/27/2020 10:51 AM   HGBA1C 7.3 02/03/2016 12:00 AM   HGBA1C 7.3 02/03/2016 12:00 AM   GFR 90.37 05/26/2021 01:33 PM   GFR 74.32 06/27/2020 10:51 AM   MICROALBUR 15.0 (H) 05/26/2021 01:33 PM   MICROALBUR 5.4 (H) 02/20/2019 10:35 AM    Last diabetic Eye exam: No results found for: HMDIABEYEEXA  Last diabetic Foot exam: No results found for: HMDIABFOOTEX   Lab Results  Component Value Date   CHOL 119 05/26/2021   HDL 25.60 (L) 05/26/2021   LDLCALC UNABLE TO CALCULATE IF TRIGLYCERIDE OVER 400 mg/dL 03/27/2014   LDLDIRECT 43.0 05/26/2021   TRIG 378.0 (H) 05/26/2021   CHOLHDL 5 05/26/2021    Hepatic Function Latest Ref Rng & Units 05/26/2021 06/27/2020 02/20/2019  Total Protein 6.0 - 8.3 g/dL 6.8 6.9 7.4  Albumin 3.5 - 5.2 g/dL 4.1 4.1 4.4  AST 0 - 37 U/L $Remo'9 11 20  'jcywy$ ALT 0 - 53 U/L $Remo'6 10 20  'uwXdA$ Alk Phosphatase 39 - 117 U/L 67 64 56  Total Bilirubin 0.2 - 1.2 mg/dL 0.4 0.4 0.4  Bilirubin, Direct 0.0 - 0.3 mg/dL 0.1 0.1 0.1    Lab Results  Component Value Date/Time  TSH 2.50 11/21/2018 10:49 AM   TSH 2.91 02/03/2016 12:00 AM    CBC Latest Ref Rng & Units 06/27/2020 11/21/2018 10/04/2017  WBC 4.0 - 10.5 K/uL 9.9 12.2(H) 9.5  Hemoglobin 13.0 - 17.0 g/dL 13.1 13.5 9.0(L)  Hematocrit 39.0 - 52.0 % 39.4 39.7 28.0(L)  Platelets 150.0 - 400.0 K/uL 282.0 342.0 161    No results found for: VD25OH  Clinical ASCVD: Yes  The ASCVD Risk score Mikey Bussing DC Jr., et al., 2013) failed to calculate for the following reasons:   The valid total cholesterol range is 130 to 320 mg/dL    Depression screen [facility] 2/9  05/26/2021 10/16/2020 06/27/2020  Decreased Interest 0 0 0  Down, Depressed, Hopeless 0 0 0  PHQ - 2 Score 0 0 0  Altered sleeping 0 - 0  Tired, decreased energy 0 - 0  Change in appetite 0 - 0  Feeling bad or failure about yourself  0 - 0  Trouble concentrating 0 - 0  Moving slowly or fidgety/restless 0 - 0  Suicidal thoughts 0 - 0  PHQ-9 Score 0 - 0  Difficult doing work/chores Not difficult at all - -    Social History   Tobacco Use  Smoking Status Some Days   Packs/day: 0.50   Years: 35.00   Pack years: 17.50   Types: Cigarettes  Smokeless Tobacco Never   BP Readings from Last 3 Encounters:  05/26/21 122/60  06/27/20 128/68  10/30/19 130/78   Pulse Readings from Last 3 Encounters:  05/26/21 71  06/27/20 72  10/30/19 70   Wt Readings from Last 3 Encounters:  05/26/21 154 lb (69.9 kg)  10/16/20 162 lb (73.5 kg)  06/27/20 162 lb (73.5 kg)   BMI Readings from Last 3 Encounters:  05/26/21 20.32 kg/m  10/16/20 21.37 kg/m  06/27/20 21.37 kg/m    Assessment/Interventions: Review of patient past medical history, allergies, medications, health status, including review of consultants reports, laboratory and other test data, was performed as part of comprehensive evaluation and provision of chronic care management services.   SDOH:  (Social Determinants of Health) assessments and interventions performed: Yes  SDOH Screenings   Alcohol Screen: Low Risk    Last Alcohol Screening Score (AUDIT): 0  Depression (PHQ2-9): Low Risk    PHQ-2 Score: 0  Financial Resource Strain: Low Risk    Difficulty of Paying Living Expenses: Not hard at all  Food Insecurity: No Food Insecurity   Worried About Charity fundraiser in the Last Year: Never true   Ran Out of Food in the Last Year: Never true  Housing: Low Risk    Last Housing Risk Score: 0  Physical Activity: Insufficiently Active   Days of Exercise per Week: 2 days   Minutes of Exercise per Session: 20 min  Social  Connections: Socially Isolated   Frequency of Communication with Friends and Family: More than three times a week   Frequency of Social Gatherings with Friends and Family: Once a week   Attends Religious Services: Never   Marine scientist or Organizations: No   Attends Archivist Meetings: Never   Marital Status: Widowed  Stress: No Stress Concern Present   Feeling of Stress : Not at all  Tobacco Use: High Risk   Smoking Tobacco Use: Some Days   Smokeless Tobacco Use: Never  Transportation Needs: No Transportation Needs   Lack of Transportation (Medical): No   Lack of Transportation (Non-Medical): No    CCM  Care Plan  No Known Allergies  Medications Reviewed Today     Reviewed by Flossie Buffy, NP (Nurse Practitioner) on 05/26/21 at 1352  Med List Status: <None>   Medication Order Taking? Sig Documenting Provider Last Dose Status Informant  amLODipine (NORVASC) 5 MG tablet [id number] Yes Take 1 tablet (5 mg total) by mouth daily. Need office visit for additional refills Nche, Charlene Brooke, NP Taking Active   aspirin 81 MG tablet [id number] Yes Take 81 mg by mouth daily. [provider] Taking Active Self  atorvastatin (LIPITOR) 40 MG tablet [id number] Yes Take 1 tablet (40 mg total) by mouth daily at 6 PM. Need office visit for additional refills Nche, Charlene Brooke, NP Taking Active   blood glucose meter kit and supplies KIT [id number] Yes Dispense based on patient and insurance preference. Use two times daily as directed (before breakfast and at bedtime. (FOR ICD-10: E08.42) Nche, Charlene Brooke, NP Taking Active Self  fenofibrate (TRICOR) 145 MG tablet [id number] Yes Take 1 tablet (145 mg total) by mouth daily. Nche, Charlene Brooke, NP Taking Active   metFORMIN (GLUCOPHAGE) 850 MG tablet [id number] Yes Take 1 tablet (850 mg total) by mouth 2 (two) times daily with a meal. Need office visit for additional refills Nche, Charlene Brooke, NP Taking Active    olmesartan (BENICAR) 40 MG tablet [id number] Yes Take 1 tablet (40 mg total) by mouth daily. Need office visit for additional refills Nche, Charlene Brooke, NP Taking Active   sertraline (ZOLOFT) 100 MG tablet [id number] Yes Take 1 tablet (100 mg total) by mouth daily. Need office visit for additional refills Nche, Charlene Brooke, NP Taking Active             Patient Active Problem List   Diagnosis Date Noted   Left renal artery stenosis ([place]) 06/27/2020   Recurrent major depressive disorder, in full remission ([place]) 06/27/2020   Stage 3a chronic kidney disease ([place]) 10/30/2019   Hypertrophic toenail 10/30/2019   PAD (peripheral artery disease) ([place]) 10/01/2017   Status post femoral-popliteal bypass surgery 09/30/2017   DM (diabetes mellitus) with peripheral vascular complication ([place]) 20/09/711   Hypertriglyceridemia 10/01/2016   S/P unilateral BKA (below knee amputation), right ([place]) 09/30/2016   Neuropathy 09/30/2016   Vascular disease, peripheral ([place]) 09/30/2016   Phantom pain after amputation of lower extremity ([place]) 09/30/2016   History of CVA (cerebrovascular accident) without residual deficits 03/25/2014   Essential hypertension 03/25/2014   Tobacco abuse 03/25/2014    Immunization History  Administered Date(s) Administered   Fluad Quad(high Dose 65+) 10/30/2019   Pneumococcal Conjugate-13 05/26/2021   Tdap 05/26/2021    Conditions to be addressed/monitored:  Hypertension, Hyperlipidemia, Diabetes, Chronic Kidney Disease, Depression, Tobacco use, and Peripheral Vascular Disease, History of CVA  There are no care plans that you recently modified to display for this patient.    Medication Assistance: None required.  Patient affirms current coverage meets needs.  Compliance/Adherence/Medication fill history: Care Gaps: Covid 19 Vaccine Ophthalmology Shingrix Influenza Vaccine  Star-Rating Drugs: Olmesartan 40 mg last filled 05/13/2021 90 day supply at [facility] Metformin 850 mg  last filled 05/28/2021 90 day supply at [facility] Rosuvastatin 40 mg last filled 05/28/2021 90 day supply at [facility].  Patient's preferred pharmacy is:  [employer]  [facility]) - [address] [place] [address] Phone: [phone] Fax: [phone]  Uses pill box? {Yes or If no, why not?:20788}  Pt endorses ***% compliance  We discussed: {Pharmacy options:24294} Patient decided to: {US Pharmacy Plan:23885}  Care Plan and Follow Up Patient Decision:  {FOLLOWUP:24991}  Plan: {CM FOLLOW UP HWKG:88110}  ***  Current Barriers:  {pharmacybarriers:24917}  Pharmacist Clinical Goal(s):  Patient will {PHARMACYGOALCHOICES:24921} through collaboration with PharmD and provider.   Interventions: 1:1 collaboration with Nche, Charlene Brooke, NP regarding development and update of comprehensive plan of care as evidenced by provider attestation and co-signature Inter-disciplinary care team collaboration (see longitudinal plan of care) Comprehensive medication review performed; medication list updated in electronic medical record  Hypertension  (BP goal <130/80) -Controlled -Current treatment: Amlodipine 5 mg daily  Olmesartan 40 mg daily  -Medications previously tried: ***  -Current home readings: *** -Current dietary habits: *** -Current exercise habits: *** -{ACTIONS;DENIES/REPORTS:21021675} hypotensive/hypertensive symptoms -Educated on {CCM BP Counseling:25124} -Counseled to monitor BP at home ***, document, and provide log at future appointments -{CCMPHARMDINTERVENTION:25122}  Hyperlipidemia: (LDL goal < 70) -Controlled -History of CVA, history of PVD -Current treatment: Fenofibrate 145 mg daily  Rosuvastatin 40 mg daily -Current treatment: Aspirin 81 mg daily  -Medications previously tried: ***  -Current dietary patterns: *** -Current exercise habits:  *** -Educated on {CCM HLD Counseling:25126} -{CCMPHARMDINTERVENTION:25122}  Diabetes (A1c goal <7%) -{US controlled/uncontrolled:25276} -Current medications: Metformin 850 mg twice daily -Medications previously tried: ***  -Current home glucose readings fasting glucose: *** post prandial glucose: *** -{ACTIONS;DENIES/REPORTS:21021675} hypoglycemic/hyperglycemic symptoms -Current meal patterns:  breakfast: ***  lunch: ***  dinner: *** snacks: *** drinks: *** -Current exercise: *** -Educated on {CCM DM COUNSELING:25123} -Counseled to check feet daily and get yearly eye exams -{CCMPHARMDINTERVENTION:25122}  Depression/Anxiety (Goal: ***) -{US controlled/uncontrolled:25276} -Current treatment: Sertraline 100 mg daily  -Medications previously tried/failed: *** -PHQ9: *** -GAD7: *** -Connected with *** for mental health support -Educated on {CCM mental health counseling:25127} -{CCMPHARMDINTERVENTION:25122}  Tobacco use (Goal ***) -{US controlled/uncontrolled:25276} -Previous quit attempts: *** -Current treatment  Smoking 1/2 ppd -Patient smokes {Time to first cigarette:23873} -Patient triggers include: {Smoking Triggers:23882} -On a scale of 1-10, reports MOTIVATION to quit is *** -On a scale of 1-10, reports CONFIDENCE in quitting is *** -{Smoking Cessation Counseling:23883} -{CCMPHARMDINTERVENTION:25122}  Patient Goals/Self-Care Activities Patient will:  - {pharmacypatientgoals:24919}  Follow Up Plan: {CM FOLLOW UP RPRX:45859}

## 2021-07-16 DIAGNOSIS — Z89511 Acquired absence of right leg below knee: Secondary | ICD-10-CM | POA: Diagnosis not present

## 2021-07-23 ENCOUNTER — Telehealth: Payer: Self-pay | Admitting: *Deleted

## 2021-07-23 NOTE — Chronic Care Management (AMB) (Signed)
  Care Management   Note  07/23/2021 Name: Jordan Arellano MRN: [id number] DOB: [birth year]  Jordan Arellano is a 70 y.o. year old male who is a primary care patient of Nche, Charlene Brooke, NP and is actively engaged with the care management team. I reached out to [name] by phone today to assist with re-scheduling an initial visit with the Pharmacist  Follow up plan: Unsuccessful telephone outreach attempt made. A HIPAA compliant phone message was left for the patient providing contact information and requesting a return call.   Julian Hy, [place] Management  Direct Dial: [phone]

## 2021-07-27 NOTE — Chronic Care Management (AMB) (Signed)
  Care Management   Note  07/27/2021 Name: Sudeys Pruner MRN: [id number] DOB: [birth year]  Jordan Arellano is a 70 y.o. year old male who is a primary care patient of Nche, Charlene Brooke, NP and is actively engaged with the care management team. I reached out to [name] by phone today to assist with re-scheduling an initial visit with the Pharmacist  Follow up plan: 2nd attempt Unsuccessful telephone outreach attempt made. A HIPAA compliant phone message was left for the patient providing contact information and requesting a return call.   Julian Hy, [place] Management  Direct Dial: [phone]

## 2021-07-27 NOTE — Telephone Encounter (Signed)
Pt said he had missed a call and requested a call back at [phone]

## 2021-07-27 NOTE — Chronic Care Management (AMB) (Signed)
  Care Management   Note  07/27/2021 Name: Jordan Arellano MRN: [id number] DOB: 1951/01/24  Jordan Arellano is a 70 y.o. year old male who is a primary care patient of Nche, Charlene Brooke, NP and is actively engaged with the care management team. I reached out to [name] by phone today to assist with re-scheduling an initial visit with the Pharmacist  Follow up plan: Telephone appointment with care management team member scheduled for: 09/08/2021  Julian Hy, [place], [place] Management  Direct Dial: [phone]

## 2021-08-21 ENCOUNTER — Telehealth: Payer: Self-pay | Admitting: Nurse Practitioner

## 2021-08-21 NOTE — Telephone Encounter (Signed)
Pt called with questions about the Rosuvastatin Rx.

## 2021-08-28 NOTE — Telephone Encounter (Signed)
Spoke to patient and advised that the Rosuvastatin replaced the Lipitor and was changed back in June.  No further questions. Dm/cma

## 2021-09-04 ENCOUNTER — Telehealth: Payer: Self-pay

## 2021-09-04 NOTE — Progress Notes (Signed)
Per clinical pharmacist and [facility], I Left a voice message to inform patient I had  to move his Initial telephone appointment with the clinical pharmacist due to scheduling conflict.Informed patient to return my call at [phone] to reschedule his appointment.  [place] Pharmacist Assistant [phone]

## 2021-09-08 ENCOUNTER — Telehealth: Payer: Medicare Other

## 2021-09-24 ENCOUNTER — Ambulatory Visit (INDEPENDENT_AMBULATORY_CARE_PROVIDER_SITE_OTHER): Payer: Medicare Other

## 2021-09-24 DIAGNOSIS — E781 Pure hyperglyceridemia: Secondary | ICD-10-CM

## 2021-09-24 DIAGNOSIS — E1151 Type 2 diabetes mellitus with diabetic peripheral angiopathy without gangrene: Secondary | ICD-10-CM

## 2021-09-24 DIAGNOSIS — I10 Essential (primary) hypertension: Secondary | ICD-10-CM

## 2021-09-24 NOTE — Progress Notes (Signed)
Chronic Care Management Pharmacy Note  10/08/2021 Name:  Jordan Arellano MRN:  [id number] DOB:  11/13/1951  Summary: Patient presents for initial CCM Consult.   Recommendations/Changes made from today's visit: Continue current medications   Plan: CPP follow-up 6 months   Subjective: Jordan Arellano is an 70 y.o. year old male who is a primary patient of Nche, Charlene Brooke, NP.  The CCM team was consulted for assistance with disease management and care coordination needs.    Engaged with patient by telephone for initial visit in response to provider referral for pharmacy case management and/or care coordination services.   Consent to Services:  The patient was given the following information about Chronic Care Management services today, agreed to services, and gave verbal consent: 1. CCM service includes personalized support from designated clinical staff supervised by the primary care provider, including individualized plan of care and coordination with other care providers 2. 24/7 contact phone numbers for assistance for urgent and routine care needs. 3. Service will only be billed when office clinical staff spend 20 minutes or more in a month to coordinate care. 4. Only one practitioner may furnish and bill the service in a calendar month. 5.The patient may stop CCM services at any time (effective at the end of the month) by phone call to the office staff. 6. The patient will be responsible for cost sharing (co-pay) of up to 20% of the service fee (after annual deductible is met). Patient agreed to services and consent obtained.  Patient Care Team: Nche, Charlene Brooke, NP as PCP - General (Internal Medicine) Germaine Pomfret, [facility] as Pharmacist (Pharmacist)  Recent office visits: 05/26/21: Patient presented to Wilfred Lacy, NP for follow-up.   Recent consult visits: None in previous 6 months  Hospital visits: None in previous 6 months   Objective:  Lab Results  Component  Value Date   CREATININE 0.81 05/26/2021   BUN 15 05/26/2021   GFR 90.37 05/26/2021   GFRNONAA >60 10/02/2017   GFRAA >60 10/02/2017   NA 138 05/26/2021   K 4.0 05/26/2021   CALCIUM 9.1 05/26/2021   CO2 26 05/26/2021   GLUCOSE 121 (H) 05/26/2021    Lab Results  Component Value Date/Time   HGBA1C 6.3 05/26/2021 01:33 PM   HGBA1C 6.7 (H) 06/27/2020 10:51 AM   HGBA1C 7.3 02/03/2016 12:00 AM   HGBA1C 7.3 02/03/2016 12:00 AM   GFR 90.37 05/26/2021 01:33 PM   GFR 74.32 06/27/2020 10:51 AM   MICROALBUR 15.0 (H) 05/26/2021 01:33 PM   MICROALBUR 5.4 (H) 02/20/2019 10:35 AM    Last diabetic Eye exam: No results found for: HMDIABEYEEXA  Last diabetic Foot exam: No results found for: HMDIABFOOTEX   Lab Results  Component Value Date   CHOL 119 05/26/2021   HDL 25.60 (L) 05/26/2021   LDLCALC UNABLE TO CALCULATE IF TRIGLYCERIDE OVER 400 mg/dL 03/27/2014   LDLDIRECT 43.0 05/26/2021   TRIG 378.0 (H) 05/26/2021   CHOLHDL 5 05/26/2021    Hepatic Function Latest Ref Rng & Units 05/26/2021 06/27/2020 02/20/2019  Total Protein 6.0 - 8.3 g/dL 6.8 6.9 7.4  Albumin 3.5 - 5.2 g/dL 4.1 4.1 4.4  AST 0 - 37 U/L $Remo'9 11 20  'GNbRE$ ALT 0 - 53 U/L $Remo'6 10 20  'oTarR$ Alk Phosphatase 39 - 117 U/L 67 64 56  Total Bilirubin 0.2 - 1.2 mg/dL 0.4 0.4 0.4  Bilirubin, Direct 0.0 - 0.3 mg/dL 0.1 0.1 0.1    Lab Results  Component Value Date/Time  TSH 2.50 11/21/2018 10:49 AM   TSH 2.91 02/03/2016 12:00 AM    CBC Latest Ref Rng & Units 06/27/2020 11/21/2018 10/04/2017  WBC 4.0 - 10.5 K/uL 9.9 12.2(H) 9.5  Hemoglobin 13.0 - 17.0 g/dL 13.1 13.5 9.0(L)  Hematocrit 39.0 - 52.0 % 39.4 39.7 28.0(L)  Platelets 150.0 - 400.0 K/uL 282.0 342.0 161    No results found for: VD25OH  Clinical ASCVD: No  The ASCVD Risk score (Arnett DK, et al., 2019) failed to calculate for the following reasons:   The valid total cholesterol range is 130 to 320 mg/dL    Depression screen [facility] 2/9 09/24/2021 05/26/2021 10/16/2020  Decreased Interest  0 0 0  Down, Depressed, Hopeless 0 0 0  PHQ - 2 Score 0 0 0  Altered sleeping - 0 -  Tired, decreased energy - 0 -  Change in appetite - 0 -  Feeling bad or failure about yourself  - 0 -  Trouble concentrating - 0 -  Moving slowly or fidgety/restless - 0 -  Suicidal thoughts - 0 -  PHQ-9 Score - 0 -  Difficult doing work/chores - Not difficult at all -   Social History   Tobacco Use  Smoking Status Some Days   Packs/day: 0.50   Years: 35.00   Pack years: 17.50   Types: Cigarettes  Smokeless Tobacco Never   BP Readings from Last 3 Encounters:  05/26/21 122/60  06/27/20 128/68  10/30/19 130/78   Pulse Readings from Last 3 Encounters:  05/26/21 71  06/27/20 72  10/30/19 70   Wt Readings from Last 3 Encounters:  05/26/21 154 lb (69.9 kg)  10/16/20 162 lb (73.5 kg)  06/27/20 162 lb (73.5 kg)   BMI Readings from Last 3 Encounters:  05/26/21 20.32 kg/m  10/16/20 21.37 kg/m  06/27/20 21.37 kg/m    Assessment/Interventions: Review of patient past medical history, allergies, medications, health status, including review of consultants reports, laboratory and other test data, was performed as part of comprehensive evaluation and provision of chronic care management services.   SDOH:  (Social Determinants of Health) assessments and interventions performed: Yes SDOH Interventions    Flowsheet Row Most Recent Value  SDOH Interventions   Financial Strain Interventions Intervention Not Indicated      SDOH Screenings   Alcohol Screen: Low Risk    Last Alcohol Screening Score (AUDIT): 0  Depression (PHQ2-9): Low Risk    PHQ-2 Score: 0  Financial Resource Strain: Low Risk    Difficulty of Paying Living Expenses: Not hard at all  Food Insecurity: No Food Insecurity   Worried About Charity fundraiser in the Last Year: Never true   Ran Out of Food in the Last Year: Never true  Housing: Low Risk    Last Housing Risk Score: 0  Physical Activity: Insufficiently Active    Days of Exercise per Week: 2 days   Minutes of Exercise per Session: 20 min  Social Connections: Socially Isolated   Frequency of Communication with Friends and Family: More than three times a week   Frequency of Social Gatherings with Friends and Family: Once a week   Attends Religious Services: Never   Marine scientist or Organizations: No   Attends Archivist Meetings: Never   Marital Status: Widowed  Stress: No Stress Concern Present   Feeling of Stress : Not at all  Tobacco Use: High Risk   Smoking Tobacco Use: Some Days   Smokeless Tobacco Use: Never  Passive Exposure: Not on file  Transportation Needs: No Transportation Needs   Lack of Transportation (Medical): No   Lack of Transportation (Non-Medical): No    CCM Care Plan  No Known Allergies  Medications Reviewed Today     Reviewed by Flossie Buffy, NP (Nurse Practitioner) on 05/26/21 at 1352  Med List Status: <None>   Medication Order Taking? Sig Documenting Provider Last Dose Status Informant  amLODipine (NORVASC) 5 MG tablet [id number] Yes Take 1 tablet (5 mg total) by mouth daily. Need office visit for additional refills Nche, Charlene Brooke, NP Taking Active   aspirin 81 MG tablet [id number] Yes Take 81 mg by mouth daily. [provider] Taking Active Self  atorvastatin (LIPITOR) 40 MG tablet [id number] Yes Take 1 tablet (40 mg total) by mouth daily at 6 PM. Need office visit for additional refills Nche, Charlene Brooke, NP Taking Active   blood glucose meter kit and supplies KIT [id number] Yes Dispense based on patient and insurance preference. Use two times daily as directed (before breakfast and at bedtime. (FOR ICD-10: E08.42) Nche, Charlene Brooke, NP Taking Active Self  fenofibrate (TRICOR) 145 MG tablet [id number] Yes Take 1 tablet (145 mg total) by mouth daily. Nche, Charlene Brooke, NP Taking Active   metFORMIN (GLUCOPHAGE) 850 MG tablet [id number] Yes Take 1 tablet (850 mg total) by mouth 2  (two) times daily with a meal. Need office visit for additional refills Nche, Charlene Brooke, NP Taking Active   olmesartan (BENICAR) 40 MG tablet [id number] Yes Take 1 tablet (40 mg total) by mouth daily. Need office visit for additional refills Nche, Charlene Brooke, NP Taking Active   sertraline (ZOLOFT) 100 MG tablet [id number] Yes Take 1 tablet (100 mg total) by mouth daily. Need office visit for additional refills Nche, Charlene Brooke, NP Taking Active             Patient Active Problem List   Diagnosis Date Noted   Left renal artery stenosis ([place]) 06/27/2020   Recurrent major depressive disorder, in full remission ([place]) 06/27/2020   Stage 3a chronic kidney disease ([place]) 10/30/2019   Hypertrophic toenail 10/30/2019   PAD (peripheral artery disease) ([place]) 10/01/2017   Status post femoral-popliteal bypass surgery 09/30/2017   DM (diabetes mellitus) with peripheral vascular complication ([place]) 03/00/9233   Hypertriglyceridemia 10/01/2016   S/P unilateral BKA (below knee amputation), right ([place]) 09/30/2016   Neuropathy 09/30/2016   Vascular disease, peripheral ([place]) 09/30/2016   Phantom pain after amputation of lower extremity ([place]) 09/30/2016   History of CVA (cerebrovascular accident) without residual deficits 03/25/2014   Essential hypertension 03/25/2014   Tobacco abuse 03/25/2014    Immunization History  Administered Date(s) Administered   Fluad Quad(high Dose 65+) 10/30/2019   Pneumococcal Conjugate-13 05/26/2021   Tdap 05/26/2021    Conditions to be addressed/monitored:  Hypertension, Hyperlipidemia, Diabetes, and Depression  Care Plan : General Pharmacy (Adult)  Updates made by Germaine Pomfret, RPH since 10/08/2021 12:00 AM     Problem: Hypertension, Hyperlipidemia, Diabetes, and Depression   Priority: High     Long-Range Goal: Patient-Specific Goal   Start Date: 10/08/2021  Expected End Date: 10/08/2022  This Visit's Progress: On track  Priority: High  Note:    Current Barriers:  No barriers noted  Pharmacist Clinical Goal(s):  Patient will maintain control of diabetes as evidenced by A1c less than 8%  through collaboration with PharmD and provider.   Interventions: 1:1 collaboration with Nche, Charlene Brooke, NP regarding development and update of  comprehensive plan of care as evidenced by provider attestation and co-signature Inter-disciplinary care team collaboration (see longitudinal plan of care) Comprehensive medication review performed; medication list updated in electronic medical record  Hypertension (BP goal <140/90) -Controlled -Current treatment: Amlodipine 5 mg daily  Olmesartan 40 mg daily (AM) -Medications previously tried: NA  -Current home readings: Systolics 353I  -Current dietary habits: Does eat campbell's soup. Sandwiches. Drinks coffee (1 cup) and juice. Does drink flavored water.  -Current exercise habits: Walking machine 1-2 times daily for 10-15 minutes  -Denies hypotensive/hypertensive symptoms -Recommended to continue current medication  Hyperlipidemia: (LDL goal < 70) -Controlled -History of CVA, PAD -Current treatment: Fenofibrate 145 mg  Rosuvastatin 40 mg  -Medications previously tried: NA  -Recommended to continue current medication  Diabetes (A1c goal <8%) -Controlled -Current medications: Metformin 850 mg twice daily  -Medications previously tried: NA  -Recommended to continue current medication  Depression(Goal: Achieve symptom remission) -Controlled -Current treatment: Sertraline 100 mg daily -Medications previously tried/failed: NA -Recommended to continue current medication  Patient Goals/Self-Care Activities Patient will:  - check blood pressure weekly, document, and provide at future appointments  Follow Up Plan: Telephone follow up appointment with care management team member scheduled for:  03/25/2022 at 11:00 AM      Medication Assistance: None required.  Patient affirms  current coverage meets needs.  Patient's preferred pharmacy is:  [facility] ([facility] - [place], [facility] [address] [place] [address] [place] [address] Phone: [phone] Fax: [phone]  [facility] - [place], [address] [place] [address] Phone: [phone] Fax: [phone]  Uses pill box? Yes Pt endorses 100% compliance  We discussed: Current pharmacy is preferred with insurance plan and patient is satisfied with pharmacy services Patient decided to: Continue current medication management strategy  Care Plan and Follow Up Patient Decision:  Patient agrees to Care Plan and Follow-up.  Plan: Telephone follow up appointment with care management team member scheduled for:  03/25/2022 at 11:00 AM  Junius Argyle, PharmD, Para [place], CPP Clinical Pharmacist [facility]  [phone]

## 2021-09-30 ENCOUNTER — Other Ambulatory Visit: Payer: Self-pay | Admitting: Nurse Practitioner

## 2021-09-30 DIAGNOSIS — F3342 Major depressive disorder, recurrent, in full remission: Secondary | ICD-10-CM

## 2021-10-05 DIAGNOSIS — E781 Pure hyperglyceridemia: Secondary | ICD-10-CM

## 2021-10-05 DIAGNOSIS — I10 Essential (primary) hypertension: Secondary | ICD-10-CM | POA: Diagnosis not present

## 2021-10-05 DIAGNOSIS — E1151 Type 2 diabetes mellitus with diabetic peripheral angiopathy without gangrene: Secondary | ICD-10-CM

## 2021-10-08 NOTE — Patient Instructions (Signed)
Visit Information It was great speaking with you today!  Please let me know if you have any questions about our visit.   Goals Addressed   None     Patient Care Plan: General Pharmacy (Adult)     Problem Identified: Hypertension, Hyperlipidemia, Diabetes, and Depression   Priority: High     Long-Range Goal: Patient-Specific Goal   Start Date: 10/08/2021  Expected End Date: 10/08/2022  This Visit's Progress: On track  Priority: High  Note:   Current Barriers:  No barriers noted  Pharmacist Clinical Goal(s):  Patient will maintain control of diabetes as evidenced by A1c less than 8%  through collaboration with PharmD and provider.   Interventions: 1:1 collaboration with Jordan Arellano, Jordan Brooke, NP regarding development and update of comprehensive plan of care as evidenced by provider attestation and co-signature Inter-disciplinary care team collaboration (see longitudinal plan of care) Comprehensive medication review performed; medication list updated in electronic medical record  Hypertension (BP goal <140/90) -Controlled -Current treatment: Amlodipine 5 mg daily  Olmesartan 40 mg daily (AM) -Medications previously tried: NA  -Current home readings: Systolics 919T  -Current dietary habits: Does eat campbell's soup. Sandwiches. Drinks coffee (1 cup) and juice. Does drink flavored water.  -Current exercise habits: Walking machine 1-2 times daily for 10-15 minutes  -Denies hypotensive/hypertensive symptoms -Recommended to continue current medication  Hyperlipidemia: (LDL goal < 70) -Controlled -History of CVA, PAD -Current treatment: Fenofibrate 145 mg  Rosuvastatin 40 mg  -Medications previously tried: NA  -Recommended to continue current medication  Diabetes (A1c goal <8%) -Controlled -Current medications: Metformin 850 mg twice daily  -Medications previously tried: NA  -Recommended to continue current medication  Depression(Goal: Achieve symptom  remission) -Controlled -Current treatment: Sertraline 100 mg daily -Medications previously tried/failed: NA -Recommended to continue current medication  Patient Goals/Self-Care Activities Patient will:  - check blood pressure weekly, document, and provide at future appointments  Follow Up Plan: Telephone follow up appointment with care management team member scheduled for:  03/25/2022 at 11:00 AM    Jordan Arellano was given information about Chronic Care Management services today including:  CCM service includes personalized support from designated clinical staff supervised by his physician, including individualized plan of care and coordination with other care providers 24/7 contact phone numbers for assistance for urgent and routine care needs. Standard insurance, coinsurance, copays and deductibles apply for chronic care management only during months in which we provide at least 20 minutes of these services. Most insurances cover these services at 100%, however patients may be responsible for any copay, coinsurance and/or deductible if applicable. This service may help you avoid the need for more expensive face-to-face services. Only one practitioner may furnish and bill the service in a calendar month. The patient may stop CCM services at any time (effective at the end of the month) by phone call to the office staff.  Patient agreed to services and verbal consent obtained.   The patient verbalized understanding of instructions, educational materials, and care plan provided today and declined offer to receive copy of patient instructions, educational materials, and care plan.   Jordan Arellano, PharmD, Jordan Arellano, CPP Clinical Pharmacist [facility]  [phone]

## 2021-10-15 ENCOUNTER — Other Ambulatory Visit: Payer: Self-pay | Admitting: Nurse Practitioner

## 2021-10-15 DIAGNOSIS — E1151 Type 2 diabetes mellitus with diabetic peripheral angiopathy without gangrene: Secondary | ICD-10-CM

## 2021-11-04 NOTE — Telephone Encounter (Signed)
Chart supports Rx Last seen 05/2021 Next OV 11/2021

## 2021-11-26 ENCOUNTER — Ambulatory Visit: Payer: Medicare Other | Admitting: Nurse Practitioner

## 2021-12-09 ENCOUNTER — Telehealth: Payer: Self-pay

## 2021-12-09 NOTE — Progress Notes (Signed)
Chronic Care Management Pharmacy Assistant   Name: Baylen Buckner  MRN: [id number] DOB: November 06, 1951  Reason for [place] Call.   Recent office visits:  No recent office visit  Recent consult visits:  No recent consult visit  Hospital visits:  None in previous 6 months  Medications: Outpatient Encounter Medications as of 12/09/2021  Medication Sig   acetaminophen (TYLENOL) 500 MG tablet Take 500 mg by mouth every 6 (six) hours as needed.   amLODipine (NORVASC) 5 MG tablet Take 1 tablet (5 mg total) by mouth daily.   aspirin 81 MG tablet Take 81 mg by mouth daily. (Patient not taking: Reported on 09/24/2021)   blood glucose meter kit and supplies KIT Dispense based on patient and insurance preference. Use two times daily as directed (before breakfast and at bedtime. (FOR ICD-10: E08.42)   fenofibrate (TRICOR) 145 MG tablet Take 1 tablet (145 mg total) by mouth daily.   metFORMIN (GLUCOPHAGE) 850 MG tablet TAKE 1 TABLET BY MOUTH  TWICE DAILY WITH MEALS   olmesartan (BENICAR) 40 MG tablet Take 1 tablet (40 mg total) by mouth daily. Need office visit for additional refills   rosuvastatin (CRESTOR) 40 MG tablet Take 1 tablet (40 mg total) by mouth daily.   sertraline (ZOLOFT) 100 MG tablet Take 1 tablet (100 mg total) by mouth daily. Need office visit for additional refills   No facility-administered encounter medications on file as of 12/09/2021.    Care Gaps: Covid 19 Vaccine Ophthalmology Shingrix Influenza Vaccine Hemoglobin A1C  Star Rating Drugs: Olmesartan 40 mg last filled 10/30/2021 90 day supply at [facility]. Metformin 850 mg  last filled 11/04/2021 90 day supply at [facility] Rosuvastatin 40 mg last filled 11/25/2021 90 day supply at [facility].  Medication Fill Gaps: None ID  Reviewed chart prior to disease state call. Spoke with patient regarding BP  Recent Office Vitals: BP Readings from Last 3 Encounters:  05/26/21  122/60  06/27/20 128/68  10/30/19 130/78   Pulse Readings from Last 3 Encounters:  05/26/21 71  06/27/20 72  10/30/19 70    Wt Readings from Last 3 Encounters:  05/26/21 154 lb (69.9 kg)  10/16/20 162 lb (73.5 kg)  06/27/20 162 lb (73.5 kg)     Kidney Function Lab Results  Component Value Date/Time   CREATININE 0.81 05/26/2021 01:33 PM   CREATININE 1.00 06/27/2020 10:51 AM   GFR 90.37 05/26/2021 01:33 PM   GFRNONAA >60 10/02/2017 09:43 AM   GFRAA >60 10/02/2017 09:43 AM    BMP Latest Ref Rng & Units 05/26/2021 06/27/2020 10/30/2019  Glucose 70 - 99 mg/dL 121(H) 136(H) 145(H)  BUN 6 - 23 mg/dL 15 15 35(H)  Creatinine 0.40 - 1.50 mg/dL 0.81 1.00 1.53(H)  Sodium 135 - 145 mEq/L 138 140 136  Potassium 3.5 - 5.1 mEq/L 4.0 3.9 4.5  Chloride 96 - 112 mEq/L 103 107 105  CO2 19 - 32 mEq/L $Remove'26 26 24  'IxpuAKw$ Calcium 8.4 - 10.5 mg/dL 9.1 9.1 9.2    Current antihypertensive regimen:  Amlodipine 5 mg daily  Olmesartan 40 mg daily (AM)  What recent interventions/DTPs have been made by any provider to improve Blood Pressure control since last CPP Visit: None ID Any recent hospitalizations or ED visits since last visit with CPP? No  I have attempted without success to contact this patient by phone three times to do his Hypertension Disease State call. I left a Voice message for patient to return my call.LVM  01/04 .01/06,01/09  Adherence Review: Is the patient currently on ACE/ARB medication? Yes Does the patient have >5 day gap between last estimated fill dates? No  Telephone follow up appointment with care management team member scheduled for:  03/25/2022 at 11:00 AM  [place] Pharmacist Assistant [phone]

## 2021-12-15 ENCOUNTER — Telehealth: Payer: Self-pay

## 2021-12-15 NOTE — Telephone Encounter (Signed)
Called patient x3 with no answer, Vm full. Patient may reschedule for next available appointment.  L.Rob Mciver,LPN

## 2021-12-22 ENCOUNTER — Encounter: Payer: Self-pay | Admitting: Nurse Practitioner

## 2021-12-22 ENCOUNTER — Telehealth (INDEPENDENT_AMBULATORY_CARE_PROVIDER_SITE_OTHER): Payer: Commercial Managed Care - HMO | Admitting: Nurse Practitioner

## 2021-12-22 DIAGNOSIS — I10 Essential (primary) hypertension: Secondary | ICD-10-CM

## 2021-12-22 MED ORDER — AMLODIPINE BESYLATE 10 MG PO TABS: 10.0000 mg | ORAL_TABLET | Freq: Every day | ORAL | 3 refills | Status: DC

## 2021-12-22 NOTE — Progress Notes (Signed)
Virtual Visit via Video Note  I connected withNAME@ on 12/22/21 at 11:00 AM EST by a video enabled telemedicine application and verified that I am speaking with the correct person using two identifiers.  Location: Patient:Home Provider: Office Participants: patient and provider  I discussed the limitations of evaluation and management by telemedicine and the availability of in person appointments. I also discussed with the patient that there may be a patient responsible charge related to this service. The patient expressed understanding and agreed to proceed.  CC:Pt c/o elevated BP readings x 2 weeks. Pt states BP has been running 150s/80s and he has had a slight headache when it is elevated. Denies dizziness  History of Present Illness: Essential hypertension Reports elevated BP x 2weeks: 150s/80s, associated with intermittent headache. Reports he is compliant with low sodium diet and medications. Denies use of any NSAIDs or decongestant. BP Readings from Last 3 Encounters:  12/22/21 (!) 150/80  05/26/21 122/60  06/27/20 128/68   Increase amlodipine to 10mg  Maintain olmesartan dose F/up in 1week   Observations/Objective: Physical Exam Vitals and nursing note reviewed.  Eyes:     Extraocular Movements: Extraocular movements intact.     Conjunctiva/sclera: Conjunctivae normal.  Pulmonary:     Effort: Pulmonary effort is normal.  Neurological:     Mental Status: He is oriented to person, place, and time.    Assessment and Plan: Omir was seen today for acute visit.  Diagnoses and all orders for this visit:  Essential hypertension -     amLODipine (NORVASC) 10 MG tablet; Take 1 tablet (10 mg total) by mouth daily.   Follow Up Instructions: See above   I discussed the assessment and treatment plan with the patient. The patient was provided an opportunity to ask questions and all were answered. The patient agreed with the plan and demonstrated an understanding of the  instructions.   The patient was advised to call back or seek an in-person evaluation if the symptoms worsen or if the condition fails to improve as anticipated.  Wilfred Lacy, NP

## 2021-12-22 NOTE — Assessment & Plan Note (Addendum)
Reports elevated BP x 2weeks: 150s/80s, associated with intermittent headache. Reports he is compliant with low sodium diet and medications. Denies use of any NSAIDs or decongestant. BP Readings from Last 3 Encounters:  12/22/21 (!) 150/80  05/26/21 122/60  06/27/20 128/68   Increase amlodipine to 10mg  Maintain olmesartan dose F/up in 1week

## 2021-12-24 ENCOUNTER — Other Ambulatory Visit: Payer: Self-pay | Admitting: Nurse Practitioner

## 2021-12-24 DIAGNOSIS — I10 Essential (primary) hypertension: Secondary | ICD-10-CM

## 2021-12-29 ENCOUNTER — Ambulatory Visit (INDEPENDENT_AMBULATORY_CARE_PROVIDER_SITE_OTHER): Payer: Commercial Managed Care - HMO | Admitting: Nurse Practitioner

## 2021-12-29 ENCOUNTER — Encounter: Payer: Self-pay | Admitting: Nurse Practitioner

## 2021-12-29 ENCOUNTER — Other Ambulatory Visit: Payer: Self-pay

## 2021-12-29 VITALS — BP 144/78 | HR 72 | Temp 98.2°F | Ht 73.0 in | Wt 156.2 lb

## 2021-12-29 DIAGNOSIS — D492 Neoplasm of unspecified behavior of bone, soft tissue, and skin: Secondary | ICD-10-CM | POA: Diagnosis not present

## 2021-12-29 DIAGNOSIS — E1151 Type 2 diabetes mellitus with diabetic peripheral angiopathy without gangrene: Secondary | ICD-10-CM | POA: Diagnosis not present

## 2021-12-29 DIAGNOSIS — Z716 Tobacco abuse counseling: Secondary | ICD-10-CM

## 2021-12-29 DIAGNOSIS — E781 Pure hyperglyceridemia: Secondary | ICD-10-CM | POA: Diagnosis not present

## 2021-12-29 DIAGNOSIS — I10 Essential (primary) hypertension: Secondary | ICD-10-CM

## 2021-12-29 DIAGNOSIS — Z720 Tobacco use: Secondary | ICD-10-CM | POA: Diagnosis not present

## 2021-12-29 DIAGNOSIS — Z23 Encounter for immunization: Secondary | ICD-10-CM | POA: Diagnosis not present

## 2021-12-29 DIAGNOSIS — I739 Peripheral vascular disease, unspecified: Secondary | ICD-10-CM

## 2021-12-29 LAB — LIPID PANEL
Cholesterol: 131 mg/dL (ref 0–200)
HDL: 39.3 mg/dL (ref >39.00)
LDL Cholesterol: 64 mg/dL (ref 0–99)
NonHDL: 92.19
Total CHOL/HDL Ratio: 3
Triglycerides: 143 mg/dL (ref 0.0–149.0)
VLDL: 28.6 mg/dL (ref 0.0–40.0)

## 2021-12-29 LAB — BASIC METABOLIC PANEL
BUN: 22 mg/dL (ref 6–23)
CO2: 24 mEq/L (ref 19–32)
Calcium: 9.3 mg/dL (ref 8.4–10.5)
Chloride: 108 mEq/L (ref 96–112)
Creatinine, Ser: 1.05 mg/dL (ref 0.40–1.50)
GFR: 72.45 mL/min (ref >60.00)
Glucose, Bld: 120 mg/dL — ABNORMAL HIGH (ref 70–99)
Potassium: 3.9 mEq/L (ref 3.5–5.1)
Sodium: 140 mEq/L (ref 135–145)

## 2021-12-29 LAB — HEMOGLOBIN A1C: Hgb A1c MFr Bld: 6.2 % (ref 4.6–6.5)

## 2021-12-29 MED ORDER — VARENICLINE TARTRATE 0.5 MG PO TABS: ORAL_TABLET | ORAL | 1 refills | Status: DC

## 2021-12-29 NOTE — Progress Notes (Signed)
Subjective:  Patient ID: Jordan Arellano, male    DOB: 12/20/1950  Age: 71 y.o. MRN: [id number]  CC: Follow-up (Pt states BP has been elevated x 3 weeks and he would like to discuss. )  HPI  Tobacco abuse 1/2ppd >16yrs. Quit in past x 9months with use of nicotine patch. We discussed use of nicotine patch vs chantix. I recommended use of chantix over nicotine patch due to effects on BP. Advised about possible side effects of Chantix. He agreed to start chantix. New rx sent with starting instructions F/up in 59month  Essential hypertension BP not at goal with amlodipine $RemoveBeforeD'10mg'LTynDrYAWUoTAX$  and olmesartan $RemoveBefore'40mg'lwxJhIggcXnNH$  Starts he is complaint with medication and low sodium diet. Continues tobacco use 1/2ppd BP Readings from Last 3 Encounters:  12/29/21 (!) 144/78  12/22/21 (!) 150/80  05/26/21 122/60   Repeat BMP. Maintain current medications If able to discontinue tobacco use, BP will improve. Chantix prescribed F/up in 32month  DM (diabetes mellitus) with peripheral vascular complication (HCC) Repeat hgbA1c: controlled DM with hgbA1c at 6.2% No adverse effects with metformin Advised to schedule appt with ophthalmology Maintain med dose  Hypertriglyceridemia Repeat lipid panel: Improved triglyceride with fenofibrate and crestor Lipid Panel     Component Value Date/Time   CHOL 131 12/29/2021 1150   TRIG 143.0 12/29/2021 1150   HDL 39.30 12/29/2021 1150   CHOLHDL 3 12/29/2021 1150   VLDL 28.6 12/29/2021 1150   LDLCALC 64 12/29/2021 1150   LDLDIRECT 43.0 05/26/2021 1333   Decrease fenofibrate dose to 54 Maintain crestor dose at 40 He agreed to start chantix for tobacco cessation Repeat lipid panel in 3-71months  Reviewed past Medical, Social and Family history today.  Outpatient Medications Prior to Visit  Medication Sig Dispense Refill   acetaminophen (TYLENOL) 500 MG tablet Take 500 mg by mouth every 6 (six) hours as needed.     amLODipine (NORVASC) 10 MG tablet Take 1 tablet (10 mg total)  by mouth daily. 90 tablet 3   aspirin 81 MG tablet Take 81 mg by mouth daily.     blood glucose meter kit and supplies KIT Dispense based on patient and insurance preference. Use two times daily as directed (before breakfast and at bedtime. (FOR ICD-10: E08.42) 1 each 2   metFORMIN (GLUCOPHAGE) 850 MG tablet TAKE 1 TABLET BY MOUTH  TWICE DAILY WITH MEALS 180 tablet 3   sertraline (ZOLOFT) 100 MG tablet Take 1 tablet (100 mg total) by mouth daily. Need office visit for additional refills 90 tablet 1   fenofibrate (TRICOR) 145 MG tablet Take 1 tablet (145 mg total) by mouth daily. 90 tablet 3   olmesartan (BENICAR) 40 MG tablet Take 1 tablet (40 mg total) by mouth daily. Need office visit for additional refills 90 tablet 1   rosuvastatin (CRESTOR) 40 MG tablet Take 1 tablet (40 mg total) by mouth daily. 90 tablet 3   No facility-administered medications prior to visit.   ROS See HPI  Objective:  BP (!) 144/78 (BP Location: Right Arm, Patient Position: Sitting)    Pulse 72    Temp 98.2 F (36.8 C) (Temporal)    Ht $R'6\' 1"'zB$  (1.854 m)    Wt 156 lb 3.2 oz (70.9 kg)    SpO2 98%    BMI 20.61 kg/m   Physical Exam Vitals reviewed.  Cardiovascular:     Rate and Rhythm: Normal rate and regular rhythm.     Pulses: Normal pulses.     Heart sounds: Normal heart sounds.  Pulmonary:     Effort: Pulmonary effort is normal.     Breath sounds: Normal breath sounds.  Musculoskeletal:     Left lower leg: No edema.  Neurological:     Mental Status: He is alert and oriented to person, place, and time.   Assessment & Plan:  This visit occurred during the SARS-CoV-2 public health emergency.  Safety protocols were in place, including screening questions prior to the visit, additional usage of staff PPE, and extensive cleaning of exam room while observing appropriate contact time as indicated for disinfecting solutions.   Gray was seen today for follow-up.  Diagnoses and all orders for this  visit:  Essential hypertension -     Basic metabolic panel -     olmesartan (BENICAR) 40 MG tablet; Take 1 tablet (40 mg total) by mouth daily.  Encounter for tobacco use cessation counseling -     varenicline (CHANTIX) 0.5 MG tablet; Start 0.$RemoveBeforeD'5mg'BwcptKVhXwiVrd$  daily x 3days, then 0.$RemoveBefor'5mg'jvOmxErEZMQs$  BID continuously x 1week, then 2tabs BID continuously  DM (diabetes mellitus) with peripheral vascular complication (HCC) -     Hemoglobin A1c -     Basic metabolic panel  Atypical squamoproliferative skin lesion -     Ambulatory referral to Dermatology  Hypertriglyceridemia -     Lipid panel -     fenofibrate 54 MG tablet; Take 1 tablet (54 mg total) by mouth daily. -     rosuvastatin (CRESTOR) 40 MG tablet; Take 1 tablet (40 mg total) by mouth daily.  Flu vaccine need -     Flu Vaccine QUAD High Dose(Fluad)  Tobacco abuse  PAD (peripheral artery disease) (HCC) -     rosuvastatin (CRESTOR) 40 MG tablet; Take 1 tablet (40 mg total) by mouth daily.   Problem List Items Addressed This Visit       Cardiovascular and Mediastinum   Essential hypertension - Primary (Chronic)    BP not at goal with amlodipine $RemoveBeforeD'10mg'XpVNswgLkwqduD$  and olmesartan $RemoveBefore'40mg'WbNkeiAjsdmjC$  Starts he is complaint with medication and low sodium diet. Continues tobacco use 1/2ppd BP Readings from Last 3 Encounters:  12/29/21 (!) 144/78  12/22/21 (!) 150/80  05/26/21 122/60   Repeat BMP. Maintain current medications If able to discontinue tobacco use, BP will improve. Chantix prescribed F/up in 59month      Relevant Medications   fenofibrate 54 MG tablet   rosuvastatin (CRESTOR) 40 MG tablet   olmesartan (BENICAR) 40 MG tablet   Other Relevant Orders   Basic metabolic panel (Completed)   DM (diabetes mellitus) with peripheral vascular complication (HCC)    Repeat hgbA1c: controlled DM with hgbA1c at 6.2% No adverse effects with metformin Advised to schedule appt with ophthalmology Maintain med dose      Relevant Medications   fenofibrate 54 MG tablet    rosuvastatin (CRESTOR) 40 MG tablet   olmesartan (BENICAR) 40 MG tablet   Other Relevant Orders   Hemoglobin A1c (Completed)   Basic metabolic panel (Completed)   PAD (peripheral artery disease) (HCC)   Relevant Medications   fenofibrate 54 MG tablet   rosuvastatin (CRESTOR) 40 MG tablet   olmesartan (BENICAR) 40 MG tablet     Other   Hypertriglyceridemia (Chronic)    Repeat lipid panel: Improved triglyceride with fenofibrate and crestor Lipid Panel     Component Value Date/Time   CHOL 131 12/29/2021 1150   TRIG 143.0 12/29/2021 1150   HDL 39.30 12/29/2021 1150   CHOLHDL 3 12/29/2021 1150   VLDL 28.6 12/29/2021 1150  LDLCALC 64 12/29/2021 1150   LDLDIRECT 43.0 05/26/2021 1333   Decrease fenofibrate dose to 54 Maintain crestor dose at 40 He agreed to start chantix for tobacco cessation Repeat lipid panel in 3-66months      Relevant Medications   fenofibrate 54 MG tablet   rosuvastatin (CRESTOR) 40 MG tablet   olmesartan (BENICAR) 40 MG tablet   Other Relevant Orders   Lipid panel (Completed)   Tobacco abuse (Chronic)    1/2ppd >55yrs. Quit in past x 40months with use of nicotine patch. We discussed use of nicotine patch vs chantix. I recommended use of chantix over nicotine patch due to effects on BP. Advised about possible side effects of Chantix. He agreed to start chantix. New rx sent with starting instructions F/up in 8month      Other Visit Diagnoses     Encounter for tobacco use cessation counseling       Relevant Medications   varenicline (CHANTIX) 0.5 MG tablet   Atypical squamoproliferative skin lesion       Relevant Orders   Ambulatory referral to Dermatology   Flu vaccine need       Relevant Orders   Flu Vaccine QUAD High Dose(Fluad) (Completed)       Follow-up: Return in about 4 weeks (around 01/26/2022) for HTN and tobacco use cessation.  Wilfred Lacy, NP

## 2021-12-29 NOTE — Assessment & Plan Note (Signed)
BP not at goal with amlodipine 10mg  and olmesartan 40mg  Starts he is complaint with medication and low sodium diet. Continues tobacco use 1/2ppd BP Readings from Last 3 Encounters:  12/29/21 (!) 144/78  12/22/21 (!) 150/80  05/26/21 122/60   Repeat BMP. Maintain current medications If able to discontinue tobacco use, BP will improve. Chantix prescribed F/up in 63month

## 2021-12-29 NOTE — Assessment & Plan Note (Addendum)
Repeat hgbA1c: controlled DM with hgbA1c at 6.2% No adverse effects with metformin Advised to schedule appt with ophthalmology Maintain med dose

## 2021-12-29 NOTE — Patient Instructions (Addendum)
Go to lab for blood draw Maintain current BP medications  You will be contacted to schedule appt with dermatology  Schedule appt with ophthalmology for a diabetic eye exam.  Start chantix as discussed Set quit date, 1week before your start chantix.

## 2021-12-29 NOTE — Assessment & Plan Note (Addendum)
1/2ppd >55yrs. Quit in past x 55months with use of nicotine patch. We discussed use of nicotine patch vs chantix. I recommended use of chantix over nicotine patch due to effects on BP. Advised about possible side effects of Chantix. He agreed to start chantix. New rx sent with starting instructions F/up in 2month

## 2021-12-30 ENCOUNTER — Other Ambulatory Visit: Payer: Self-pay | Admitting: Nurse Practitioner

## 2021-12-30 ENCOUNTER — Telehealth: Payer: Self-pay | Admitting: Nurse Practitioner

## 2021-12-30 DIAGNOSIS — I10 Essential (primary) hypertension: Secondary | ICD-10-CM

## 2021-12-30 MED ORDER — OLMESARTAN MEDOXOMIL 40 MG PO TABS: 40.0000 mg | ORAL_TABLET | Freq: Every day | ORAL | 3 refills | Status: DC

## 2021-12-30 MED ORDER — ROSUVASTATIN CALCIUM 40 MG PO TABS: 40.0000 mg | ORAL_TABLET | Freq: Every day | ORAL | 3 refills | Status: DC

## 2021-12-30 MED ORDER — FENOFIBRATE 54 MG PO TABS: 54.0000 mg | ORAL_TABLET | Freq: Every day | ORAL | 3 refills | Status: DC

## 2021-12-30 NOTE — Assessment & Plan Note (Signed)
Repeat lipid panel: Improved triglyceride with fenofibrate and crestor Lipid Panel     Component Value Date/Time   CHOL 131 12/29/2021 1150   TRIG 143.0 12/29/2021 1150   HDL 39.30 12/29/2021 1150   CHOLHDL 3 12/29/2021 1150   VLDL 28.6 12/29/2021 1150   LDLCALC 64 12/29/2021 1150   LDLDIRECT 43.0 05/26/2021 1333   Decrease fenofibrate dose to 54 Maintain crestor dose at 40 He agreed to start chantix for tobacco cessation Repeat lipid panel in 3-81months

## 2021-12-30 NOTE — Telephone Encounter (Signed)
error 

## 2021-12-30 NOTE — Assessment & Plan Note (Signed)
>>  ASSESSMENT AND PLAN FOR HYPERTRIGLYCERIDEMIA WRITTEN ON 12/30/2021  8:44 AM BY NCHE, CHARLOTTE LUM, NP  Repeat lipid panel: Improved triglyceride with fenofibrate  and crestor  Lipid Panel     Component Value Date/Time   CHOL 131 12/29/2021 1150   TRIG 143.0 12/29/2021 1150   HDL 39.30 12/29/2021 1150   CHOLHDL 3 12/29/2021 1150   VLDL 28.6 12/29/2021 1150   LDLCALC 64 12/29/2021 1150   LDLDIRECT 43.0 05/26/2021 1333   Decrease fenofibrate  dose to 54 Maintain crestor  dose at 40 He agreed to start chantix  for tobacco cessation Repeat lipid panel in 3-12months

## 2021-12-31 ENCOUNTER — Telehealth: Payer: Self-pay | Admitting: Dermatology

## 2021-12-31 NOTE — Telephone Encounter (Signed)
Patient is calling for a referral appointment from Flossie Buffy, NP.  Patient is scheduled for 07/20/2022 at 10:00 with Lavonna Monarch, M.D.

## 2021-12-31 NOTE — Telephone Encounter (Signed)
Referral attached to appointment

## 2022-01-08 ENCOUNTER — Telehealth: Payer: Self-pay | Admitting: Nurse Practitioner

## 2022-01-08 DIAGNOSIS — I10 Essential (primary) hypertension: Secondary | ICD-10-CM

## 2022-01-08 MED ORDER — SPIRONOLACTONE 25 MG PO TABS: 25.0000 mg | ORAL_TABLET | Freq: Every day | ORAL | 1 refills | Status: DC

## 2022-01-08 NOTE — Telephone Encounter (Signed)
-----   Message from [place], [place] sent at 01/08/2022 12:35 PM EST ----- BP today was 155/80 but he does not remember he other days because he has not been writing them down. He has not started Chantix and states he will start it this weekend.  ----- Message ----- From: Flossie Buffy, NP Sent: 01/06/2022   8:00 AM EST To: Arcelia Jew, CMA  Please cal and inquire about current BP reading. Also ask if he started chantix? If yes, any adverse side effects?

## 2022-01-08 NOTE — Telephone Encounter (Signed)
Sent spironolactone 25mg . Start this Maintain other med doses. Bring all medications and BP machine to next office visit.

## 2022-01-08 NOTE — Telephone Encounter (Signed)
Left detailed voicemail with information for patient on his personal mailbox and informed him to call our office on Monday with any questions or concerns.

## 2022-01-13 ENCOUNTER — Ambulatory Visit: Payer: Medicare Other | Admitting: Nurse Practitioner

## 2022-01-19 ENCOUNTER — Telehealth: Payer: Self-pay | Admitting: Nurse Practitioner

## 2022-01-19 NOTE — Telephone Encounter (Signed)
Sabonie from [place] is wanting a call back from [place] to discuss pt's treatment plan. She would not go into detail, she said it was confidential. Please advise Sabonie at [phone] cell# and [phone] is her office line
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
## 2022-01-21 ENCOUNTER — Other Ambulatory Visit: Payer: Self-pay

## 2022-01-21 ENCOUNTER — Ambulatory Visit (INDEPENDENT_AMBULATORY_CARE_PROVIDER_SITE_OTHER): Payer: Medicare Other | Admitting: Nurse Practitioner

## 2022-01-21 ENCOUNTER — Telehealth: Payer: Self-pay | Admitting: Nurse Practitioner

## 2022-01-21 ENCOUNTER — Encounter: Payer: Self-pay | Admitting: Nurse Practitioner

## 2022-01-21 VITALS — BP 138/64 | HR 78 | Temp 97.8°F | Ht 73.0 in | Wt 158.0 lb

## 2022-01-21 DIAGNOSIS — Z0279 Encounter for issue of other medical certificate: Secondary | ICD-10-CM | POA: Diagnosis not present

## 2022-01-21 DIAGNOSIS — I10 Essential (primary) hypertension: Secondary | ICD-10-CM

## 2022-01-21 NOTE — Patient Instructions (Signed)
Will call you after specking with social worker. F/up with me in 66month
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
## 2022-01-21 NOTE — Telephone Encounter (Signed)
Met with Mr. Penton and his Carloyn Jaeger (Half sister) in office today. They are requesting for a form completed. This form was to be provided by the Social worker [place]. I do not have this form, so I called Ms. Sabonie to ask for form. I left a voice message to return my call

## 2022-01-21 NOTE — Assessment & Plan Note (Signed)
Improved BP BP Readings from Last 3 Encounters:  01/21/22 138/64  12/29/21 (!) 144/78  12/22/21 (!) 150/80   Maintain current medications

## 2022-01-29 ENCOUNTER — Ambulatory Visit: Payer: Commercial Managed Care - HMO | Admitting: Nurse Practitioner

## 2022-01-29 LAB — FECAL OCCULT BLOOD, GUAIAC: Fecal Occult Blood: NEGATIVE

## 2022-02-01 ENCOUNTER — Telehealth: Payer: Self-pay | Admitting: Nurse Practitioner
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
## 2022-02-03 ENCOUNTER — Ambulatory Visit: Payer: Commercial Managed Care - HMO | Admitting: Nurse Practitioner

## 2022-02-05 ENCOUNTER — Telehealth: Payer: Self-pay

## 2022-02-05 NOTE — Progress Notes (Unsigned)
Chronic Care Management Pharmacy Assistant   Name: Jordan Arellano  MRN: [id number] DOB: 1950-12-11  Reason for Encounter: Diabetes Disease State Call.   Recent office visits:  01/21/2022 Wilfred Lacy NP (PCP) No medication Changes noted, Return in about 4 weeks 01/08/2022 Wilfred Lacy NP (PCP)  Start spironolactone 25 mg daily 12/29/2021 Wilfred Lacy NP (PCP) Decrease fenofibrate dose to 54 daily, start chantix 0.5 mg, Ambulatory referral to Dermatology, follow up in 1 month 12/22/2021 Wilfred Lacy NP (PCP)  Increase amlodipine to 10 mg daily, Follow up 1 week  Recent consult visits:  No recent consult visit  Hospital visits:  None in previous 6 months  Medications: Outpatient Encounter Medications as of 02/05/2022  Medication Sig   acetaminophen (TYLENOL) 500 MG tablet Take 500 mg by mouth every 6 (six) hours as needed.   amLODipine (NORVASC) 10 MG tablet Take 1 tablet (10 mg total) by mouth daily.   aspirin 81 MG tablet Take 81 mg by mouth daily.   blood glucose meter kit and supplies KIT Dispense based on patient and insurance preference. Use two times daily as directed (before breakfast and at bedtime. (FOR ICD-10: E08.42)   fenofibrate 54 MG tablet Take 1 tablet (54 mg total) by mouth daily.   metFORMIN (GLUCOPHAGE) 850 MG tablet TAKE 1 TABLET BY MOUTH  TWICE DAILY WITH MEALS   olmesartan (BENICAR) 40 MG tablet Take 1 tablet (40 mg total) by mouth daily.   rosuvastatin (CRESTOR) 40 MG tablet Take 1 tablet (40 mg total) by mouth daily.   sertraline (ZOLOFT) 100 MG tablet Take 1 tablet (100 mg total) by mouth daily. Need office visit for additional refills   spironolactone (ALDACTONE) 25 MG tablet Take 1 tablet (25 mg total) by mouth daily.   varenicline (CHANTIX) 0.5 MG tablet Start 0.34m daily x 3days, then 0.538mBID continuously x 1week, then 2tabs BID continuously   No facility-administered encounter medications on file as of 02/05/2022.    Care  Gaps: Ophthalmology Shingrix    Star Rating Drugs: Olmesartan 40 mg [facility]  [facility]  Medication Fill Gaps: None ID  Recent Relevant Labs: Lab Results  Component Value Date/Time   HGBA1C 6.2 12/29/2021 11:50 AM   HGBA1C 6.3 05/26/2021 01:33 PM   HGBA1C 7.3 02/03/2016 12:00 AM   HGBA1C 7.3 02/03/2016 12:00 AM   MICROALBUR 15.0 (H) 05/26/2021 01:33 PM   MICROALBUR 5.4 (H) 02/20/2019 10:35 AM    Kidney Function Lab Results  Component Value Date/Time   CREATININE 1.05 12/29/2021 11:50 AM   CREATININE 0.81 05/26/2021 01:33 PM   GFR 72.45 12/29/2021 11:50 AM   GFRNONAA >60 10/02/2017 09:43 AM   GFRAA >60 10/02/2017 09:43 AM    Current antihyperglycemic regimen:  Metformin 850 mg twice daily   What recent interventions/DTPs have been made to improve glycemic control:  None ID  Have there been any recent hospitalizations or ED visits since last visit with CPP? No  Patient {reports/denies:24182} hypoglycemic symptoms, including {Hypoglycemic Symptoms:3049003}  Patient {reports/denies:24182} hyperglycemic symptoms, including {symptoms; hyperglycemia:17903}  How often are you checking your blood sugar? {BG Testing frequency:23922}  What are your blood sugars ranging?  Fasting: *** Before meals: *** After meals: *** Bedtime: ***  During the week, how often does your blood glucose drop below 70? {LowBGfrequency:24142}  Are you  checking your feet daily/regularly?   Adherence Review: Is the patient currently on a STATIN medication? Yes Is the patient currently on ACE/ARB medication? Yes Does the patient have >5 day gap between last estimated fill dates? No  Per Clinical pharmacist, please reschedule patient appointment in April to June.  {Blank single:19197::"Telephone","Office"}  follow up appointment with Care management team member scheduled for : *** at ***.  [place] Pharmacist Assistant [phone]  LVM 03/03,03/08

## 2022-02-08 NOTE — Telephone Encounter (Signed)
Will address at upcoming appointment on 3/9 ?

## 2022-02-11 ENCOUNTER — Telehealth: Payer: Self-pay

## 2022-02-11 ENCOUNTER — Encounter: Payer: Self-pay | Admitting: Nurse Practitioner

## 2022-02-11 ENCOUNTER — Telehealth: Payer: Self-pay | Admitting: Nurse Practitioner

## 2022-02-11 ENCOUNTER — Ambulatory Visit (INDEPENDENT_AMBULATORY_CARE_PROVIDER_SITE_OTHER): Payer: Medicare Other | Admitting: Nurse Practitioner

## 2022-02-11 ENCOUNTER — Other Ambulatory Visit: Payer: Self-pay

## 2022-02-11 VITALS — BP 140/64 | HR 86 | Temp 97.5°F | Ht 73.0 in | Wt 156.2 lb

## 2022-02-11 DIAGNOSIS — Z720 Tobacco use: Secondary | ICD-10-CM

## 2022-02-11 DIAGNOSIS — E1151 Type 2 diabetes mellitus with diabetic peripheral angiopathy without gangrene: Secondary | ICD-10-CM

## 2022-02-11 DIAGNOSIS — I10 Essential (primary) hypertension: Secondary | ICD-10-CM | POA: Diagnosis not present

## 2022-02-11 DIAGNOSIS — F3342 Major depressive disorder, recurrent, in full remission: Secondary | ICD-10-CM

## 2022-02-11 DIAGNOSIS — G546 Phantom limb syndrome with pain: Secondary | ICD-10-CM | POA: Diagnosis not present

## 2022-02-11 MED ORDER — SERTRALINE HCL 100 MG PO TABS: 100.0000 mg | ORAL_TABLET | Freq: Every day | ORAL | 3 refills | Status: DC

## 2022-02-11 MED ORDER — BLOOD GLUCOSE MONITOR KIT: PACK | 0 refills | Status: DC

## 2022-02-11 NOTE — Assessment & Plan Note (Addendum)
Stable mood with zoloft ?Med refill sent ?

## 2022-02-11 NOTE — Patient Instructions (Addendum)
Maintain current medications ?Bring handicap form to be completed ?
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
## 2022-02-11 NOTE — Telephone Encounter (Signed)
Pt needs meds sent to [facility] on [address] ?

## 2022-02-11 NOTE — Assessment & Plan Note (Signed)
>>  ASSESSMENT AND PLAN FOR RECURRENT MAJOR DEPRESSIVE DISORDER, IN FULL REMISSION (HCC) WRITTEN ON 02/11/2022 10:50 AM BY NCHE, CHARLOTTE LUM, NP  Stable mood with zoloft  Med refill sent

## 2022-02-11 NOTE — Assessment & Plan Note (Signed)
Denies any pain.
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
## 2022-02-11 NOTE — Assessment & Plan Note (Signed)
Current use of chantix BID, reports nausea. ?He has decrease tobacco use to <1/2ppd from 1ppd.  ?Explained nausea is due to persistent tobacco use while taking chantix. He then stated, he is not ready to stop tobacco use. I advised about the complications of continuous tobacco use. He verbalized understanding. ? ?D/c chantix at this time ?

## 2022-02-11 NOTE — Telephone Encounter (Signed)
Ms. Sabra Heck request to talk to me about Mr. Collard care. ?LVM to CB ?

## 2022-02-11 NOTE — Assessment & Plan Note (Addendum)
Stable with amlodipine, olmesartan and spironolactone ?BP Readings from Last 3 Encounters:  ?02/11/22 140/64  ?01/21/22 138/64  ?12/29/21 (!) 144/78  ? ?Maintain med doses ?

## 2022-02-11 NOTE — Progress Notes (Signed)
? ?Subjective:  ?Patient ID: Jordan Arellano, male    DOB: 09-17-1951  Age: 71 y.o. MRN: [id number] ? ?CC: Follow-up (1 month f/u on HTN and tobacco use. Sister needs handicap to help provide transportation) ? ?HPI ?Accompanied by Altha Harm. ? ?Phantom pain after amputation of lower extremity ([place]) ?Denies any pain ? ?Recurrent major depressive disorder, in full remission ([place]) ?Stable mood with zoloft ?Med refill sent ? ?Tobacco abuse ?Current use of chantix BID, reports nausea. ?He has decrease tobacco use to <1/2ppd from 1ppd.  ?Explained nausea is due to persistent tobacco use while taking chantix. He then stated, he is not ready to stop tobacco use. I advised about the complications of continuous tobacco use. He verbalized understanding. ? ?D/c chantix at this time ? ?Essential hypertension ?Stable with amlodipine, olmesartan and spironolactone ?BP Readings from Last 3 Encounters:  ?02/11/22 140/64  ?01/21/22 138/64  ?12/29/21 (!) 144/78  ? ?Maintain med doses ? ?Reviewed past Medical, Social and Family history today. ? ?Outpatient Medications Prior to Visit  ?Medication Sig Dispense Refill  ? acetaminophen (TYLENOL) 500 MG tablet Take 500 mg by mouth every 6 (six) hours as needed.    ? amLODipine (NORVASC) 10 MG tablet Take 1 tablet (10 mg total) by mouth daily. 90 tablet 3  ? aspirin 81 MG tablet Take 81 mg by mouth daily.    ? fenofibrate 54 MG tablet Take 1 tablet (54 mg total) by mouth daily. 90 tablet 3  ? metFORMIN (GLUCOPHAGE) 850 MG tablet TAKE 1 TABLET BY MOUTH  TWICE DAILY WITH MEALS 180 tablet 3  ? olmesartan (BENICAR) 40 MG tablet Take 1 tablet (40 mg total) by mouth daily. 90 tablet 3  ? rosuvastatin (CRESTOR) 40 MG tablet Take 1 tablet (40 mg total) by mouth daily. 90 tablet 3  ? spironolactone (ALDACTONE) 25 MG tablet Take 1 tablet (25 mg total) by mouth daily. 90 tablet 1  ? blood glucose meter kit and supplies KIT Dispense based on patient and insurance preference. Use two times daily  as directed (before breakfast and at bedtime. (FOR ICD-10: E08.42) 1 each 2  ? sertraline (ZOLOFT) 100 MG tablet Take 1 tablet (100 mg total) by mouth daily. Need office visit for additional refills 90 tablet 1  ? varenicline (CHANTIX) 0.5 MG tablet Start 0.$RemoveBefore'5mg'LKTaVdNOktaxP$  daily x 3days, then 0.$RemoveBefor'5mg'EwZYlsBsdBwD$  BID continuously x 1week, then 2tabs BID continuously 120 tablet 1  ? ?No facility-administered medications prior to visit.  ? ?ROS ?See HPI ? ?Objective:  ?BP 140/64 (BP Location: Left Arm, Patient Position: Sitting, Cuff Size: Large)   Pulse 86   Temp (!) 97.5 ?F (36.4 ?C) (Temporal)   Ht $R'6\' 1"'jH$  (1.854 m)   Wt 156 lb 3.2 oz (70.9 kg)   SpO2 98%   BMI 20.61 kg/m?  ? ?Physical Exam ?Cardiovascular:  ?   Rate and Rhythm: Normal rate and regular rhythm.  ?   Pulses: Normal pulses.  ?   Heart sounds: Normal heart sounds.  ?Pulmonary:  ?   Effort: Pulmonary effort is normal.  ?   Breath sounds: Normal breath sounds.  ?Musculoskeletal:  ?   Left lower leg: No edema.  ?Neurological:  ?   Mental Status: He is alert and oriented to person, place, and time.  ? ? ?Assessment & Plan:  ?This visit occurred during the SARS-CoV-2 public health emergency.  Safety protocols were in place, including screening questions prior to the visit, additional usage of staff PPE, and extensive cleaning of exam  room while observing appropriate contact time as indicated for disinfecting solutions.  ? ?Rumi was seen today for follow-up. ? ?Diagnoses and all orders for this visit: ? ?Essential hypertension ? ?DM (diabetes mellitus) with peripheral vascular complication (HCC) ?-     blood glucose meter kit and supplies KIT; Dispense based on patient and insurance preference. Use two times daily as directed (before breakfast and at bedtime. (FOR ICD-10: E11.51) ? ?Tobacco abuse ? ?Phantom pain after amputation of lower extremity ([place]) ? ?Recurrent major depressive disorder, in full remission ([place]) ?-     sertraline (ZOLOFT) 100 MG tablet; Take 1 tablet (100 mg  total) by mouth daily. ? ? ?Problem List Items Addressed This Visit   ? ?  ? Cardiovascular and Mediastinum  ? Essential hypertension - Primary (Chronic)  ?  Stable with amlodipine, olmesartan and spironolactone ?BP Readings from Last 3 Encounters:  ?02/11/22 140/64  ?01/21/22 138/64  ?12/29/21 (!) 144/78  ? ?Maintain med doses ?  ?  ? DM (diabetes mellitus) with peripheral vascular complication ([place])  ? Relevant Medications  ? blood glucose meter kit and supplies KIT  ?  ? Other  ? Tobacco abuse (Chronic)  ?  Current use of chantix BID, reports nausea. ?He has decrease tobacco use to <1/2ppd from 1ppd.  ?Explained nausea is due to persistent tobacco use while taking chantix. He then stated, he is not ready to stop tobacco use. I advised about the complications of continuous tobacco use. He verbalized understanding. ? ?D/c chantix at this time ?  ?  ? Phantom pain after amputation of lower extremity ([place])  ?  Denies any pain ?  ?  ? Recurrent major depressive disorder, in full remission ([place])  ?  Stable mood with zoloft ?Med refill sent ?  ?  ? Relevant Medications  ? sertraline (ZOLOFT) 100 MG tablet  ?  ?Follow-up: Return in about 3 months (around 05/14/2022) for DM and HTN, hyperlipidemia (fasting). ? ?Wilfred Lacy, NP ?

## 2022-02-15 NOTE — Telephone Encounter (Signed)
Pharmacy updated and Rx sent.  ?

## 2022-03-18 ENCOUNTER — Other Ambulatory Visit: Payer: Self-pay | Admitting: Nurse Practitioner

## 2022-03-18 DIAGNOSIS — I10 Essential (primary) hypertension: Secondary | ICD-10-CM

## 2022-03-19 NOTE — Telephone Encounter (Signed)
Refill request to soon, pt should have refill on file at pharmacy.  ?

## 2022-03-25 ENCOUNTER — Telehealth: Payer: Medicare Other

## 2022-04-07 ENCOUNTER — Ambulatory Visit (INDEPENDENT_AMBULATORY_CARE_PROVIDER_SITE_OTHER): Payer: Medicare Other

## 2022-04-07 DIAGNOSIS — Z0100 Encounter for examination of eyes and vision without abnormal findings: Secondary | ICD-10-CM

## 2022-04-07 DIAGNOSIS — Z0000 Encounter for general adult medical examination without abnormal findings: Secondary | ICD-10-CM | POA: Diagnosis not present

## 2022-04-07 DIAGNOSIS — Z1211 Encounter for screening for malignant neoplasm of colon: Secondary | ICD-10-CM | POA: Diagnosis not present

## 2022-04-07 DIAGNOSIS — Z122 Encounter for screening for malignant neoplasm of respiratory organs: Secondary | ICD-10-CM

## 2022-04-07 NOTE — Patient Instructions (Signed)
[name] , ?Thank you for taking time to come for your Medicare Wellness Visit. I appreciate your ongoing commitment to your health goals. Please review the following plan we discussed and let me know if I can assist you in the future.  ? ?Screening recommendations/referrals: ?Colonoscopy: referral 01/08/2022 ?Recommended yearly ophthalmology/optometry visit for glaucoma screening and checkup ?Recommended yearly dental visit for hygiene and checkup ? ?Vaccinations: ?Influenza vaccine: completed  ?Pneumococcal vaccine: completed  ?Tdap vaccine: 05/26/2021 ?Shingles vaccine: will consider    ? ?Advanced directives: none  ? ?Conditions/risks identified: none  ? ?Next appointment: none  ? ?Preventive Care 8 Years and Older, Male ?Preventive care refers to lifestyle choices and visits with your health care provider that can promote health and wellness. ?What does preventive care include? ?A yearly physical exam. This is also called an annual well check. ?Dental exams once or twice a year. ?Routine eye exams. Ask your health care provider how often you should have your eyes checked. ?Personal lifestyle choices, including: ?Daily care of your teeth and gums. ?Regular physical activity. ?Eating a healthy diet. ?Avoiding tobacco and drug use. ?Limiting alcohol use. ?Practicing safe sex. ?Taking low doses of aspirin every day. ?Taking vitamin and mineral supplements as recommended by your health care provider. ?What happens during an annual well check? ?The services and screenings done by your health care provider during your annual well check will depend on your age, overall health, lifestyle risk factors, and family history of disease. ?Counseling  ?Your health care provider may ask you questions about your: ?Alcohol use. ?Tobacco use. ?Drug use. ?Emotional well-being. ?Home and relationship well-being. ?Sexual activity. ?Eating habits. ?History of falls. ?Memory and ability to understand (cognition). ?Work and work  Statistician. ?Screening  ?You may have the following tests or measurements: ?Height, weight, and BMI. ?Blood pressure. ?Lipid and cholesterol levels. These may be checked every 5 years, or more frequently if you are over 73 years old. ?Skin check. ?Lung cancer screening. You may have this screening every year starting at age 59 if you have a 30-pack-year history of smoking and currently smoke or have quit within the past 15 years. ?Fecal occult blood test (FOBT) of the stool. You may have this test every year starting at age 33. ?Flexible sigmoidoscopy or colonoscopy. You may have a sigmoidoscopy every 5 years or a colonoscopy every 10 years starting at age 73. ?Prostate cancer screening. Recommendations will vary depending on your family history and other risks. ?Hepatitis C blood test. ?Hepatitis B blood test. ?Sexually transmitted disease (STD) testing. ?Diabetes screening. This is done by checking your blood sugar (glucose) after you have not eaten for a while (fasting). You may have this done every 1-3 years. ?Abdominal aortic aneurysm (AAA) screening. You may need this if you are a current or former smoker. ?Osteoporosis. You may be screened starting at age 41 if you are at high risk. ?Talk with your health care provider about your test results, treatment options, and if necessary, the need for more tests. ?Vaccines  ?Your health care provider may recommend certain vaccines, such as: ?Influenza vaccine. This is recommended every year. ?Tetanus, diphtheria, and acellular pertussis (Tdap, Td) vaccine. You may need a Td booster every 10 years. ?Zoster vaccine. You may need this after age 29. ?Pneumococcal 13-valent conjugate (PCV13) vaccine. One dose is recommended after age 64. ?Pneumococcal polysaccharide (PPSV23) vaccine. One dose is recommended after age 17. ?Talk to your health care provider about which screenings and vaccines you need and how often  you need them. ?This information is not intended to replace  advice given to you by your health care provider. Make sure you discuss any questions you have with your health care provider. ?Document Released: 12/19/2015 Document Revised: 08/11/2016 Document Reviewed: 09/23/2015 ?Elsevier Interactive Patient Education ? 2017 [employer]. ? ?Fall Prevention in the Home ?Falls can cause injuries. They can happen to people of all ages. There are many things you can do to make your home safe and to help prevent falls. ?What can I do on the outside of my home? ?Regularly fix the edges of walkways and driveways and fix any cracks. ?Remove anything that might make you trip as you walk through a door, such as a raised step or threshold. ?Trim any bushes or trees on the path to your home. ?Use bright outdoor lighting. ?Clear any walking paths of anything that might make someone trip, such as rocks or tools. ?Regularly check to see if handrails are loose or broken. Make sure that both sides of any steps have handrails. ?Any raised decks and porches should have guardrails on the edges. ?Have any leaves, snow, or ice cleared regularly. ?Use sand or salt on walking paths during winter. ?Clean up any spills in your garage right away. This includes oil or grease spills. ?What can I do in the bathroom? ?Use night lights. ?Install grab bars by the toilet and in the tub and shower. Do not use towel bars as grab bars. ?Use non-skid mats or decals in the tub or shower. ?If you need to sit down in the shower, use a plastic, non-slip stool. ?Keep the floor dry. Clean up any water that spills on the floor as soon as it happens. ?Remove soap buildup in the tub or shower regularly. ?Attach bath mats securely with double-sided non-slip rug tape. ?Do not have throw rugs and other things on the floor that can make you trip. ?What can I do in the bedroom? ?Use night lights. ?Make sure that you have a light by your bed that is easy to reach. ?Do not use any sheets or blankets that are too big for your bed.  They should not hang down onto the floor. ?Have a firm chair that has side arms. You can use this for support while you get dressed. ?Do not have throw rugs and other things on the floor that can make you trip. ?What can I do in the kitchen? ?Clean up any spills right away. ?Avoid walking on wet floors. ?Keep items that you use a lot in easy-to-reach places. ?If you need to reach something above you, use a strong step stool that has a grab bar. ?Keep electrical cords out of the way. ?Do not use floor polish or wax that makes floors slippery. If you must use wax, use non-skid floor wax. ?Do not have throw rugs and other things on the floor that can make you trip. ?What can I do with my stairs? ?Do not leave any items on the stairs. ?Make sure that there are handrails on both sides of the stairs and use them. Fix handrails that are broken or loose. Make sure that handrails are as long as the stairways. ?Check any carpeting to make sure that it is firmly attached to the stairs. Fix any carpet that is loose or worn. ?Avoid having throw rugs at the top or bottom of the stairs. If you do have throw rugs, attach them to the floor with carpet tape. ?Make sure that you have a light  switch at the top of the stairs and the bottom of the stairs. If you do not have them, ask someone to add them for you. ?What else can I do to help prevent falls? ?Wear shoes that: ?Do not have high heels. ?Have rubber bottoms. ?Are comfortable and fit you well. ?Are closed at the toe. Do not wear sandals. ?If you use a stepladder: ?Make sure that it is fully opened. Do not climb a closed stepladder. ?Make sure that both sides of the stepladder are locked into place. ?Ask someone to hold it for you, if possible. ?Clearly mark and make sure that you can see: ?Any grab bars or handrails. ?First and last steps. ?Where the edge of each step is. ?Use tools that help you move around (mobility aids) if they are needed. These  include: ?Canes. ?Walkers. ?Scooters. ?Crutches. ?Turn on the lights when you go into a dark area. Replace any light bulbs as soon as they burn out. ?Set up your furniture so you have a clear path. Avoid moving your furniture aroun

## 2022-04-07 NOTE — Progress Notes (Signed)
? ?Subjective:  ? Jordan Arellano is a 71 y.o. male who presents for an Subsequent Medicare Annual Wellness Visit. ? ? ?I connected with Jordan Arellano  today by telephone and verified that I am speaking with the correct person using two identifiers. ?Location patient: home ?Location provider: work ?Persons participating in the virtual visit: patient, provider. ?  ?I discussed the limitations, risks, security and privacy concerns of performing an evaluation and management service by telephone and the availability of in person appointments. I also discussed with the patient that there may be a patient responsible charge related to this service. The patient expressed understanding and verbally consented to this telephonic visit.  ?  ?Interactive audio and video telecommunications were attempted between this provider and patient, however failed, due to patient having technical difficulties OR patient did not have access to video capability.  We continued and completed visit with audio only. ? ?  ?Review of Systems    ? ?Cardiac Risk Factors include: advanced age (>78mn, >>51women);diabetes mellitus;dyslipidemia;male gender;hypertension ? ?   ?Objective:  ?  ?Today's Vitals  ? ?There is no height or weight on file to calculate BMI. ? ? ?  04/07/2022  ?  2:37 PM 10/16/2020  ? 12:51 PM 10/01/2017  ?  2:45 AM 12/17/2016  ?  7:00 PM 12/12/2016  ?  1:14 PM 09/30/2016  ?  3:13 PM 03/28/2014  ?  6:09 PM  ?Advanced Directives  ?Does Patient Have a Medical Advance Directive? Yes _0  Patient does not have advance directive;Patient would like information  ?Type of [place]amed[place]of [place]        ?Copy of [place]? No - copy requested        ?Would patient like information on creating a medical advance directive?  Yes (MAU/Ambulatory/Procedural Areas - Information given) No - Patient declined No - Patient declined  No - patient declined information Advance directive  packet given  ?Pre-existing out of facility DNR order (yellow form or pink MOST form)       No  ? ? ?Current Medications (verified) ?Outpatient Encounter Medications as of 04/07/2022  ?Medication Sig  ? acetaminophen (TYLENOL) 500 MG tablet Take 500 mg by mouth every 6 (six) hours as needed.  ? amLODipine (NORVASC) 10 MG tablet Take 1 tablet (10 mg total) by mouth daily.  ? aspirin 81 MG tablet Take 81 mg by mouth daily.  ? fenofibrate 54 MG tablet Take 1 tablet (54 mg total) by mouth daily.  ? metFORMIN (GLUCOPHAGE) 850 MG tablet TAKE 1 TABLET BY MOUTH  TWICE DAILY WITH MEALS  ? olmesartan (BENICAR) 40 MG tablet Take 1 tablet (40 mg total) by mouth daily.  ? rosuvastatin (CRESTOR) 40 MG tablet Take 1 tablet (40 mg total) by mouth daily.  ? sertraline (ZOLOFT) 100 MG tablet Take 1 tablet (100 mg total) by mouth daily.  ? spironolactone (ALDACTONE) 25 MG tablet Take 1 tablet (25 mg total) by mouth daily.  ? blood glucose meter kit and supplies KIT Dispense based on patient and insurance preference. Use two times daily as directed (before breakfast and at bedtime. (FOR ICD-10: E11.51) (Patient not taking: Reported on 04/07/2022)  ? ?No facility-administered encounter medications on file as of 04/07/2022.  ? ? ?Allergies (verified) ?Patient has no known allergies.  ? ?History: ?Past Medical History:  ?Diagnosis Date  ? Depression   ? [place] ([place] 12/12[place]  ? Hyperlipidemia   ?  Hypertension   ? PAD (peripheral artery disease) ([place]) ~2007  ? s/p R BKA for non-healing wound  ? Stroke [facility]) 03/2014  ? MRI: Acute nonhemorrhagic left paracentral pontine infarct. 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  ? TIA (transient ischemic attack) 01/2014  ? ?Past Surgical History:  ?Procedure Laterality Date  ? BELOW KNEE LEG AMPUTATION Right   ? FEMORAL-POPLITEAL BYPASS GRAFT Left 12/17/2016  ? Procedure: BYPASS LEFT FEMORAL TO BELOW POPLITEAL ARTERY USING  PROPATEN GORE GRAFT;  Surgeon: Rosetta Posner, MD;  Location: [facility];  Service: Vascular;  Laterality: Left;  ? FEMORAL-POPLITEAL BYPASS GRAFT Left 09/30/2017  ? Procedure: LEFT LEG ANGIOGRAM,  THROMBECTOMY, FEM-POPLITEAL BYPASS GRAFT, tHROMBECTOMY PERONEAL ARTERY AND POSTERIOR TIBIAL , ENDARTERECTOMY TIBIAL/PERONEAL TRUNK WITH BOVINE PATCH ANGIOPLASTY.;  Surgeon: Conrad Henderson, MD;  Location: [facility];  Service: Vascular;  Laterality: Left;  ? PERIPHERAL VASCULAR CATHETERIZATION Left 12/14/2016  ? Procedure: Abdominal Aortogram w/Lower Extremity;  Surgeon: Angelia Mould, MD;  Location: [facility] CV [facility];  Service: Cardiovascular;  Laterality: Left;  ? TRANSTHORACIC ECHOCARDIOGRAM  01/2014  ? To evaluate possible CVA: EF 55-60%. GR 1 DD. No significant valvular lesions  ? ?Family History  ?Problem Relation Age of Onset  ? Hypertension Mother   ?     Does not know history  ? Heart disease Mother   ? Stroke Mother   ? Diabetes Mother   ? Hypertension Father   ? Heart disease Father   ? Stroke Father   ? Diabetes Sister   ? Hypertension Sister   ? Heart disease Brother   ? Hypertension Brother   ? ?Social History  ? ?Socioeconomic History  ? Marital status: Widowed  ?  Spouse name: Not on file  ? Number of children: Not on file  ? Years of education: Not on file  ? Highest education level: Not on file  ?Occupational History  ?  Comment: Disabled  ?Tobacco Use  ? Smoking status: Some Days  ?  Packs/day: 0.50  ?  Years: 35.00  ?  Pack years: 17.50  ?  Types: Cigarettes  ? Smokeless tobacco: Never  ?Vaping Use  ? Vaping Use: Never used  ?Substance and Sexual Activity  ? Alcohol use: No  ? Drug use: No  ? Sexual activity: Not Currently  ?Other Topics Concern  ? Not on file  ?Social History Narrative  ? Not on file  ? ?Social Determinants of Health  ? ?Financial Resource Strain: Low Risk   ? Difficulty of Paying Living Expenses: Not hard at all  ?Food Insecurity: No Food Insecurity  ? Worried About Charity fundraiser in  the Last Year: Never true  ? Ran Out of Food in the Last Year: Never true  ?Transportation Needs: No Transportation Needs  ? Lack of Transportation (Medical): No  ? Lack of Transportation (Non-Medical): No  ?Physical Activity: Insufficiently Active  ? Days of Exercise per Week: 2 days  ? Minutes of Exercise per Session: 30 min  ?Stress: No Stress Concern Present  ? Feeling of Stress : Not at all  ?Social Connections: Socially Isolated  ? Frequency of Communication with Friends and Family: Three times a week  ? Frequency of Social Gatherings with Friends and Family: Three times a week  ? Attends Religious Services: Never  ? Active Member of Clubs or Organizations: No  ? Attends Archivist Meetings: Never  ? Marital  Status: Widowed  ? ? ?Tobacco Counseling ?Ready to quit: Not Answered ?Counseling given: Not Answered ? ? ?Clinical Intake: ? ?Pre-visit preparation completed: Yes ? ?Pain : No/denies pain ? ?  ? ?Nutritional Risks: None ?Diabetes: Yes ?CBG done?: No ?Did pt. bring in CBG monitor from home?: No ? ?How often do you need to have someone help you when you read instructions, pamphlets, or other written materials from your doctor or pharmacy?: 1 - Never ?What is the last grade level you completed in school?: 10grade ? ?Diabetic?yes ?Nutrition Risk Assessment: ? ?Has the patient had any N/V/D within the last 2 months?  No  ?Does the patient have any non-healing wounds?  No  ?Has the patient had any unintentional weight loss or weight gain?  No  ? ?Diabetes: ? ?Is the patient diabetic?  Yes  ?If diabetic, was a CBG obtained today?  No  ?Did the patient bring in their glucometer from home?  No  ?How often do you monitor your CBG's? Never .  ? ?Financial Strains and Diabetes Management: ? ?Are you having any financial strains with the device, your supplies or your medication? No .  ?Does the patient want to be seen by Chronic Care Management for management of their diabetes?  No  ?Would the patient like  to be referred to a Nutritionist or for Diabetic Management?  No  ? ?Diabetic Exams: ? ?Diabetic Eye Exam: Overdue for diabetic eye exam. Pt has been advised about the importance in completing this exam. Pati

## 2022-04-13 ENCOUNTER — Telehealth: Payer: Self-pay | Admitting: Nurse Practitioner

## 2022-04-13 NOTE — Telephone Encounter (Signed)
pt requesting meter to measure blood sugar ?

## 2022-04-27 NOTE — Telephone Encounter (Signed)
LVM for patient to return call. 

## 2022-04-28 NOTE — Telephone Encounter (Signed)
Pt called back. °

## 2022-04-28 NOTE — Telephone Encounter (Signed)
Spoke with patient and handled issue.

## 2022-05-13 ENCOUNTER — Telehealth: Payer: Self-pay | Admitting: Nurse Practitioner

## 2022-05-13 NOTE — Telephone Encounter (Signed)
Called number provided, left VM adv to call back to discuss.

## 2022-05-13 NOTE — Telephone Encounter (Signed)
[employer] is calling checking on status of a durable medical equip form they are faxing over. [employer]'s phone [phone], [phone].

## 2022-05-14 NOTE — Telephone Encounter (Signed)
Called number, left VM. Faxed form back to [facility] informing them that Jordan Arellano does not work here an she is not his PCP. Waiting for a response.

## 2022-05-26 ENCOUNTER — Telehealth: Payer: Self-pay

## 2022-05-26 NOTE — Progress Notes (Cosign Needed)
Chronic Care Management APPOINTMENT REMINDER   Called [facility], No answer, left message of appointment on 05/27/2022 at 11:00 am via telephone visit with Junius Argyle , Pharm D. Notified to have all medications, supplements, blood pressure and/or blood sugar logs available during appointment and to return call if need to reschedule.  [place] Pharmacist Assistant [phone]

## 2022-05-27 ENCOUNTER — Telehealth: Payer: Medicare Other

## 2022-05-27 NOTE — Progress Notes (Deleted)
Chronic Care Management Pharmacy Note  05/27/2022 Name:  Lucero Ide MRN:  [id number] DOB:  06/12/1951  Summary: Patient presents for CCM follow-up   Recommendations/Changes made from today's visit: Continue current medications   Plan: CPP follow-up 6 months   Subjective: Rohit Deloria is an 71 y.o. year old male who is a primary patient of Nche, Charlene Brooke, NP.  The CCM team was consulted for assistance with disease management and care coordination needs.    Engaged with patient by telephone for follow up visit in response to provider referral for pharmacy case management and/or care coordination services.   Consent to Services:  The patient was given information about Chronic Care Management services, agreed to services, and gave verbal consent prior to initiation of services.  Please see initial visit note for detailed documentation.   Patient Care Team: Nche, Charlene Brooke, NP as PCP - General (Internal Medicine) Germaine Pomfret, [facility] as Pharmacist (Pharmacist)  Recent office visits: 02/11/22: Patient presented to Wilfred Lacy, NP for follow-up. Chantix stopped.    Recent consult visits: None in previous 6 months  Hospital visits: None in previous 6 months   Objective:  Lab Results  Component Value Date   CREATININE 1.05 12/29/2021   BUN 22 12/29/2021   GFR 72.45 12/29/2021   GFRNONAA >60 10/02/2017   GFRAA >60 10/02/2017   NA 140 12/29/2021   K 3.9 12/29/2021   CALCIUM 9.3 12/29/2021   CO2 24 12/29/2021   GLUCOSE 120 (H) 12/29/2021    Lab Results  Component Value Date/Time   HGBA1C 6.2 12/29/2021 11:50 AM   HGBA1C 6.3 05/26/2021 01:33 PM   HGBA1C 7.3 02/03/2016 12:00 AM   HGBA1C 7.3 02/03/2016 12:00 AM   GFR 72.45 12/29/2021 11:50 AM   GFR 90.37 05/26/2021 01:33 PM   MICROALBUR 15.0 (H) 05/26/2021 01:33 PM   MICROALBUR 5.4 (H) 02/20/2019 10:35 AM    Last diabetic Eye exam: No results found for: "HMDIABEYEEXA"  Last diabetic Foot exam: No  results found for: "HMDIABFOOTEX"   Lab Results  Component Value Date   CHOL 131 12/29/2021   HDL 39.30 12/29/2021   LDLCALC 64 12/29/2021   LDLDIRECT 43.0 05/26/2021   TRIG 143.0 12/29/2021   CHOLHDL 3 12/29/2021       Latest Ref Rng & Units 05/26/2021    1:33 PM 06/27/2020   10:51 AM 02/20/2019   10:35 AM  Hepatic Function  Total Protein 6.0 - 8.3 g/dL 6.8  6.9  7.4   Albumin 3.5 - 5.2 g/dL 4.1  4.1  4.4   AST 0 - 37 U/L $Remo'9  11  20   'BOaqI$ ALT 0 - 53 U/L $Remo'6  10  20   'wCvMy$ Alk Phosphatase 39 - 117 U/L 67  64  56   Total Bilirubin 0.2 - 1.2 mg/dL 0.4  0.4  0.4   Bilirubin, Direct 0.0 - 0.3 mg/dL 0.1  0.1  0.1     Lab Results  Component Value Date/Time   TSH 2.50 11/21/2018 10:49 AM   TSH 2.91 02/03/2016 12:00 AM       Latest Ref Rng & Units 06/27/2020   10:51 AM 11/21/2018   10:49 AM 10/04/2017    4:19 AM  CBC  WBC 4.0 - 10.5 K/uL 9.9  12.2  9.5   Hemoglobin 13.0 - 17.0 g/dL 13.1  13.5  9.0   Hematocrit 39.0 - 52.0 % 39.4  39.7  28.0   Platelets 150.0 - 400.0 K/uL 282.0  342.0  161     No results found for: "VD25OH"  Clinical ASCVD: No  The ASCVD Risk score (Arnett DK, et al., 2019) failed to calculate for the following reasons:   The systolic blood pressure is missing       04/07/2022    2:39 PM 12/22/2021   11:08 AM 09/24/2021    8:13 AM  Depression screen PHQ 2/9  Decreased Interest 0 0 0  Down, Depressed, Hopeless 0 0 0  PHQ - 2 Score 0 0 0  Altered sleeping  0   Tired, decreased energy  0   Change in appetite  0   Feeling bad or failure about yourself   0   Trouble concentrating  0   Moving slowly or fidgety/restless  0   Suicidal thoughts  0   PHQ-9 Score  0    Social History   Tobacco Use  Smoking Status Some Days   Packs/day: 0.50   Years: 35.00   Total pack years: 17.50   Types: Cigarettes  Smokeless Tobacco Never   BP Readings from Last 3 Encounters:  02/11/22 140/64  01/21/22 138/64  12/29/21 (!) 144/78   Pulse Readings from Last 3  Encounters:  02/11/22 86  01/21/22 78  12/29/21 72   Wt Readings from Last 3 Encounters:  02/11/22 156 lb 3.2 oz (70.9 kg)  01/21/22 158 lb (71.7 kg)  12/29/21 156 lb 3.2 oz (70.9 kg)   BMI Readings from Last 3 Encounters:  02/11/22 20.61 kg/m  01/21/22 20.85 kg/m  12/29/21 20.61 kg/m    Assessment/Interventions: Review of patient past medical history, allergies, medications, health status, including review of consultants reports, laboratory and other test data, was performed as part of comprehensive evaluation and provision of chronic care management services.   SDOH:  (Social Determinants of Health) assessments and interventions performed: Yes   SDOH Screenings   Alcohol Screen: Low Risk  (04/07/2022)   Alcohol Screen    Last Alcohol Screening Score (AUDIT): 0  Depression (PHQ2-9): Low Risk  (04/07/2022)   Depression (PHQ2-9)    PHQ-2 Score: 0  Financial Resource Strain: Low Risk  (10/08/2021)   Overall Financial Resource Strain (CARDIA)    Difficulty of Paying Living Expenses: Not hard at all  Food Insecurity: No Food Insecurity (04/07/2022)   Hunger Vital Sign    Worried About Running Out of Food in the Last Year: Never true    Ran Out of Food in the Last Year: Never true  Housing: Low Risk  (04/07/2022)   Housing    Last Housing Risk Score: 0  Physical Activity: Insufficiently Active (04/07/2022)   Exercise Vital Sign    Days of Exercise per Week: 2 days    Minutes of Exercise per Session: 30 min  Social Connections: Socially Isolated (04/07/2022)   Social Connection and Isolation Panel [NHANES]    Frequency of Communication with Friends and Family: Three times a week    Frequency of Social Gatherings with Friends and Family: Three times a week    Attends Religious Services: Never    Active Member of Clubs or Organizations: No    Attends Banker Meetings: Never    Marital Status: Widowed  Stress: No Stress Concern Present (04/07/2022)   [employer] of  Occupational Health - Occupational Stress Questionnaire    Feeling of Stress : Not at all  Tobacco Use: High Risk (04/07/2022)   Patient History    Smoking Tobacco Use: Some Days  Smokeless Tobacco Use: Never    Passive Exposure: Not on file  Transportation Needs: No Transportation Needs (04/07/2022)   PRAPARE - Transportation    Lack of Transportation (Medical): No    Lack of Transportation (Non-Medical): No    CCM Care Plan  No Known Allergies  Medications Reviewed Today     Reviewed by Randel Pigg, LPN (Licensed Practical Nurse) on 04/07/22 at 62  Med List Status: <None>   Medication Order Taking? Sig Documenting Provider Last Dose Status Informant  acetaminophen (TYLENOL) 500 MG tablet [id number] Yes Take 500 mg by mouth every 6 (six) hours as needed. [provider] Taking Active   amLODipine (NORVASC) 10 MG tablet [id number] Yes Take 1 tablet (10 mg total) by mouth daily. Flossie Buffy, NP Taking Active   aspirin 81 MG tablet [id number] Yes Take 81 mg by mouth daily. [provider] Taking Active Self  blood glucose meter kit and supplies KIT [id number] No Dispense based on patient and insurance preference. Use two times daily as directed (before breakfast and at bedtime. (FOR ICD-10: E11.51)  Patient not taking: Reported on 04/07/2022   Flossie Buffy, NP Not Taking Active   fenofibrate 54 MG tablet [id number] Yes Take 1 tablet (54 mg total) by mouth daily. Flossie Buffy, NP Taking Active   metFORMIN (GLUCOPHAGE) 850 MG tablet [id number] Yes TAKE 1 TABLET BY MOUTH  TWICE DAILY WITH MEALS Nche, Charlene Brooke, NP Taking Active   olmesartan (BENICAR) 40 MG tablet [id number] Yes Take 1 tablet (40 mg total) by mouth daily. Nche, Charlene Brooke, NP Taking Active   rosuvastatin (CRESTOR) 40 MG tablet [id number] Yes Take 1 tablet (40 mg total) by mouth daily. Flossie Buffy, NP Taking Active   sertraline (ZOLOFT) 100 MG tablet [id number] Yes Take 1 tablet  (100 mg total) by mouth daily. Flossie Buffy, NP Taking Active   spironolactone (ALDACTONE) 25 MG tablet [id number] Yes Take 1 tablet (25 mg total) by mouth daily. Flossie Buffy, NP Taking Active             Patient Active Problem List   Diagnosis Date Noted   Left renal artery stenosis ([place]) 06/27/2020   Recurrent major depressive disorder, in full remission ([place]) 06/27/2020   Stage 3a chronic kidney disease ([place]) 10/30/2019   Hypertrophic toenail 10/30/2019   PAD (peripheral artery disease) ([place]) 10/01/2017   Status post femoral-popliteal bypass surgery 09/30/2017   DM (diabetes mellitus) with peripheral vascular complication ([place]) 93/71/6967   Hypertriglyceridemia 10/01/2016   S/P unilateral BKA (below knee amputation), right ([place]) 09/30/2016   Neuropathy 09/30/2016   Vascular disease, peripheral ([place]) 09/30/2016   Phantom pain after amputation of lower extremity ([place]) 09/30/2016   History of CVA (cerebrovascular accident) without residual deficits 03/25/2014   Essential hypertension 03/25/2014   Tobacco abuse 03/25/2014    Immunization History  Administered Date(s) Administered   Fluad Quad(high Dose 65+) 10/30/2019, 12/29/2021   Pneumococcal Conjugate-13 05/26/2021   Tdap 05/26/2021    Conditions to be addressed/monitored:  Hypertension, Hyperlipidemia, Diabetes, and Depression  There are no care plans that you recently modified to display for this patient.     Medication Assistance: None required.  Patient affirms current coverage meets needs.  Patient's preferred pharmacy is:  [facility] - [place], [place] [address] [address] Phone: [phone] Fax: [phone]  [facility] - [place], [place] -  [place] [address] Phone: [phone] Fax: [phone]  [id number] -  [place], [place] - [address] AT [address] [place] [address] Phone: [phone] Fax: [phone]   Uses pill box? Yes Pt endorses 100% compliance  We discussed: Current pharmacy is preferred with insurance plan and patient is satisfied with pharmacy services Patient decided to: Continue current medication management strategy  Care Plan and Follow Up Patient Decision:  Patient agrees to Care Plan and Follow-up.  Plan: Telephone follow up appointment with care management team member scheduled for:  03/25/2022 at 11:00 AM  Junius Argyle, PharmD, Para [place], CPP Clinical Pharmacist [facility]  [phone]  Current Barriers:  No barriers noted  Pharmacist Clinical Goal(s):  Patient will maintain control of diabetes as evidenced by A1c less than 8%  through collaboration with PharmD and provider.   Interventions: 1:1 collaboration with Nche, Charlene Brooke, NP regarding development and update of comprehensive plan of care as evidenced by provider attestation and co-signature Inter-disciplinary care team collaboration (see longitudinal plan of care) Comprehensive medication review performed; medication list updated in electronic medical record  Hypertension (BP goal <140/90) -Controlled -Current treatment: Amlodipine 10 mg daily  Olmesartan 40 mg daily (AM) Spironolactone 25 mg daily  -Medications previously tried: HCTZ  -Current home readings: Systolics 334D  -Current dietary habits: Does eat campbell's soup. Sandwiches. Drinks coffee (1 cup) and juice. Does drink flavored water.  -Current exercise habits: Walking machine 1-2 times daily for 10-15 minutes  -Denies hypotensive/hypertensive symptoms -Recommended to continue current medication  Hyperlipidemia: (LDL goal < 70) -Controlled -History of CVA, PAD -Current treatment: Fenofibrate 54 mg daily Rosuvastatin 40 mg  -Current treatment: Aspirin 81 mg  daily -Medications previously tried: NA  -Recommended to continue current medication  Diabetes (A1c goal <8%) -Controlled -Current medications: Metformin 850 mg twice daily  -Medications previously tried: NA  -Recommended to continue current medication  Depression(Goal: Achieve symptom remission) -Controlled -Current treatment: Sertraline 100 mg daily -Medications previously tried/failed: NA -Recommended to continue current medication  Patient Goals/Self-Care Activities Patient will:  - check blood pressure weekly, document, and provide at future appointments  Follow Up Plan: ***

## 2022-06-10 DIAGNOSIS — H401113 Primary open-angle glaucoma, right eye, severe stage: Secondary | ICD-10-CM | POA: Diagnosis not present

## 2022-06-10 DIAGNOSIS — H25811 Combined forms of age-related cataract, right eye: Secondary | ICD-10-CM | POA: Diagnosis not present

## 2022-06-10 DIAGNOSIS — Z01818 Encounter for other preprocedural examination: Secondary | ICD-10-CM | POA: Diagnosis not present

## 2022-06-10 DIAGNOSIS — E119 Type 2 diabetes mellitus without complications: Secondary | ICD-10-CM | POA: Diagnosis not present

## 2022-06-10 DIAGNOSIS — H401131 Primary open-angle glaucoma, bilateral, mild stage: Secondary | ICD-10-CM | POA: Diagnosis not present

## 2022-06-10 DIAGNOSIS — H409 Unspecified glaucoma: Secondary | ICD-10-CM | POA: Diagnosis not present

## 2022-06-10 DIAGNOSIS — H269 Unspecified cataract: Secondary | ICD-10-CM | POA: Diagnosis not present

## 2022-06-10 LAB — HM DIABETES EYE EXAM

## 2022-06-17 LAB — HM DIABETES EYE EXAM

## 2022-06-25 ENCOUNTER — Telehealth: Payer: Self-pay

## 2022-06-25 NOTE — Telephone Encounter (Signed)
Patient's sister Otho Perl wants to know if Dr.G. will take Laythan on as a patient as he is a stroke survivor and has uncontrolled diabetes and is in need of a physician and not wanting to see a NP.

## 2022-06-29 NOTE — Telephone Encounter (Signed)
Plz let sister know that I am not taking new patients at this time.  However I am confident in our [place] NP's ability to care for patient.

## 2022-07-07 ENCOUNTER — Other Ambulatory Visit: Payer: Self-pay | Admitting: Nurse Practitioner

## 2022-07-07 DIAGNOSIS — E1151 Type 2 diabetes mellitus with diabetic peripheral angiopathy without gangrene: Secondary | ICD-10-CM

## 2022-07-07 NOTE — Telephone Encounter (Signed)
Chart supports Rx Last OV: 02/2022 Next OV: no showed last appt, needs appt for further refills.

## 2022-07-08 LAB — HM DIABETES EYE EXAM

## 2022-07-20 ENCOUNTER — Ambulatory Visit: Payer: Commercial Managed Care - HMO | Admitting: Dermatology

## 2022-07-21 ENCOUNTER — Ambulatory Visit (INDEPENDENT_AMBULATORY_CARE_PROVIDER_SITE_OTHER)
Admission: RE | Admit: 2022-07-21 | Discharge: 2022-07-21 | Disposition: A | Discharge status: Home / Self Care | Payer: Medicare Other | Source: Ambulatory Visit | Attending: Family | Admitting: Family

## 2022-07-21 ENCOUNTER — Ambulatory Visit (INDEPENDENT_AMBULATORY_CARE_PROVIDER_SITE_OTHER): Payer: Medicare Other | Admitting: Family

## 2022-07-21 ENCOUNTER — Encounter: Payer: Self-pay | Admitting: Family

## 2022-07-21 ENCOUNTER — Other Ambulatory Visit: Payer: Self-pay | Admitting: Family

## 2022-07-21 VITALS — BP 120/58 | HR 74 | Temp 97.8°F | Resp 16 | Ht 73.0 in | Wt 153.0 lb

## 2022-07-21 DIAGNOSIS — I739 Peripheral vascular disease, unspecified: Secondary | ICD-10-CM

## 2022-07-21 DIAGNOSIS — R001 Bradycardia, unspecified: Secondary | ICD-10-CM | POA: Diagnosis not present

## 2022-07-21 DIAGNOSIS — R0689 Other abnormalities of breathing: Secondary | ICD-10-CM | POA: Diagnosis not present

## 2022-07-21 DIAGNOSIS — N1831 Chronic kidney disease, stage 3a: Secondary | ICD-10-CM

## 2022-07-21 DIAGNOSIS — E1151 Type 2 diabetes mellitus with diabetic peripheral angiopathy without gangrene: Secondary | ICD-10-CM | POA: Diagnosis not present

## 2022-07-21 DIAGNOSIS — F3342 Major depressive disorder, recurrent, in full remission: Secondary | ICD-10-CM

## 2022-07-21 DIAGNOSIS — G629 Polyneuropathy, unspecified: Secondary | ICD-10-CM

## 2022-07-21 DIAGNOSIS — E781 Pure hyperglyceridemia: Secondary | ICD-10-CM

## 2022-07-21 DIAGNOSIS — L02419 Cutaneous abscess of limb, unspecified: Secondary | ICD-10-CM

## 2022-07-21 DIAGNOSIS — D492 Neoplasm of unspecified behavior of bone, soft tissue, and skin: Secondary | ICD-10-CM | POA: Diagnosis not present

## 2022-07-21 DIAGNOSIS — G546 Phantom limb syndrome with pain: Secondary | ICD-10-CM

## 2022-07-21 DIAGNOSIS — R413 Other amnesia: Secondary | ICD-10-CM

## 2022-07-21 DIAGNOSIS — I10 Essential (primary) hypertension: Secondary | ICD-10-CM | POA: Diagnosis not present

## 2022-07-21 DIAGNOSIS — R0602 Shortness of breath: Secondary | ICD-10-CM | POA: Diagnosis not present

## 2022-07-21 DIAGNOSIS — Z720 Tobacco use: Secondary | ICD-10-CM

## 2022-07-21 DIAGNOSIS — Z8673 Personal history of transient ischemic attack (TIA), and cerebral infarction without residual deficits: Secondary | ICD-10-CM

## 2022-07-21 DIAGNOSIS — Z95828 Presence of other vascular implants and grafts: Secondary | ICD-10-CM

## 2022-07-21 DIAGNOSIS — R0989 Other specified symptoms and signs involving the circulatory and respiratory systems: Secondary | ICD-10-CM | POA: Diagnosis not present

## 2022-07-21 LAB — CBC WITH DIFFERENTIAL/PLATELET
Basophils Absolute: 0.1 10*3/uL (ref 0.0–0.1)
Basophils Relative: 1 % (ref 0.0–3.0)
Eosinophils Absolute: 0.5 10*3/uL (ref 0.0–0.7)
Eosinophils Relative: 3.3 % (ref 0.0–5.0)
HCT: 40 % (ref 39.0–52.0)
Hemoglobin: 12.8 g/dL — ABNORMAL LOW (ref 13.0–17.0)
Lymphocytes Relative: 27.9 % (ref 12.0–46.0)
Lymphs Abs: 3.8 10*3/uL (ref 0.7–4.0)
MCHC: 31.9 g/dL (ref 30.0–36.0)
MCV: 96 fl (ref 78.0–100.0)
Monocytes Absolute: 0.8 10*3/uL (ref 0.1–1.0)
Monocytes Relative: 6.1 % (ref 3.0–12.0)
Neutro Abs: 8.4 10*3/uL — ABNORMAL HIGH (ref 1.4–7.7)
Neutrophils Relative %: 61.7 % (ref 43.0–77.0)
Platelets: 371 10*3/uL (ref 150.0–400.0)
RBC: 4.17 Mil/uL — ABNORMAL LOW (ref 4.22–5.81)
RDW: 15.1 % (ref 11.5–15.5)
WBC: 13.6 10*3/uL — ABNORMAL HIGH (ref 4.0–10.5)

## 2022-07-21 LAB — COMPREHENSIVE METABOLIC PANEL
ALT: 8 U/L (ref 0–53)
AST: 13 U/L (ref 0–37)
Albumin: 4.5 g/dL (ref 3.5–5.2)
Alkaline Phosphatase: 33 U/L — ABNORMAL LOW (ref 39–117)
BUN: 29 mg/dL — ABNORMAL HIGH (ref 6–23)
CO2: 24 mEq/L (ref 19–32)
Calcium: 9.5 mg/dL (ref 8.4–10.5)
Chloride: 104 mEq/L (ref 96–112)
Creatinine, Ser: 1.29 mg/dL (ref 0.40–1.50)
GFR: 56.37 mL/min — ABNORMAL LOW (ref >60.00)
Glucose, Bld: 94 mg/dL (ref 70–99)
Potassium: 5.1 mEq/L (ref 3.5–5.1)
Sodium: 137 mEq/L (ref 135–145)
Total Bilirubin: 0.4 mg/dL (ref 0.2–1.2)
Total Protein: 7.6 g/dL (ref 6.0–8.3)

## 2022-07-21 LAB — LIPID PANEL
Cholesterol: 152 mg/dL (ref 0–200)
HDL: 34.8 mg/dL — ABNORMAL LOW (ref >39.00)
NonHDL: 117.64
Total CHOL/HDL Ratio: 4
Triglycerides: 270 mg/dL — ABNORMAL HIGH (ref 0.0–149.0)
VLDL: 54 mg/dL — ABNORMAL HIGH (ref 0.0–40.0)

## 2022-07-21 LAB — B12 AND FOLATE PANEL
Folate: 18.7 ng/mL (ref >5.9)
Vitamin B-12: 346 pg/mL (ref 211–911)

## 2022-07-21 LAB — HEMOGLOBIN A1C: Hgb A1c MFr Bld: 6.4 % (ref 4.6–6.5)

## 2022-07-21 LAB — MICROALBUMIN / CREATININE URINE RATIO
Creatinine,U: 83 mg/dL
Microalb Creat Ratio: 7.4 mg/g (ref 0.0–30.0)
Microalb, Ur: 6.1 mg/dL — ABNORMAL HIGH (ref 0.0–1.9)

## 2022-07-21 LAB — TSH: TSH: 2.42 u[IU]/mL (ref 0.35–5.50)

## 2022-07-21 LAB — LDL CHOLESTEROL, DIRECT: Direct LDL: 93 mg/dL

## 2022-07-21 NOTE — Progress Notes (Signed)
Established Patient Office Visit  Subjective:  Patient ID: Jordan Arellano, male    DOB: July 26, 1951  Age: 71 y.o. MRN: [id number]  CC:  Chief Complaint  Patient presents with  . Transitions Of Care    HPI Alpha Mysliwiec is here for a transition of care visit.  Prior provider was: Wilfred Lacy, NP  Pt is without acute concerns.   chronic concerns:  PVD: occlusive: h/o right BKA, has h/o left femoral Below knee popliteal bypass with Gore-tex graft in January 2018. No longer with left foot pain at rest. He does still continue to smoke cigarettes, has for sixty years.   Was supposed to f/u in one year but did not. Stilll on daily asa therapy.   DM2:  Lab Results  Component Value Date   HGBA1C 6.2 12/29/2021    Past Medical History:  Diagnosis Date  . Depression   . Foot ulcer ([place]) 12/12/2016  . Hyperlipidemia   . Hypertension   . PAD (peripheral artery disease) ([place]) ~2007   s/p R BKA for non-healing wound  . Stroke ([place]) 03/2014   MRI: Acute nonhemorrhagic left paracentral pontine infarct. 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  Marland Kitchen TIA (transient ischemic attack) 01/2014    Past Surgical History:  Procedure Laterality Date  . BELOW KNEE LEG AMPUTATION Right   . FEMORAL-POPLITEAL BYPASS GRAFT Left 12/17/2016   Procedure: BYPASS LEFT FEMORAL TO BELOW POPLITEAL ARTERY USING PROPATEN GORE GRAFT;  Surgeon: Rosetta Posner, MD;  Location: [facility];  Service: Vascular;  Laterality: Left;  . FEMORAL-POPLITEAL BYPASS GRAFT Left 09/30/2017   Procedure: LEFT LEG ANGIOGRAM,  THROMBECTOMY, FEM-POPLITEAL BYPASS GRAFT, tHROMBECTOMY PERONEAL ARTERY AND POSTERIOR TIBIAL , ENDARTERECTOMY TIBIAL/PERONEAL TRUNK WITH BOVINE PATCH ANGIOPLASTY.;  Surgeon: Conrad Frederic, MD;  Location: [facility];  Service: Vascular;  Laterality: Left;  . PERIPHERAL VASCULAR CATHETERIZATION Left 12/14/2016   Procedure: Abdominal Aortogram w/Lower  Extremity;  Surgeon: Angelia Mould, MD;  Location: [facility];  Service: Cardiovascular;  Laterality: Left;  . TRANSTHORACIC ECHOCARDIOGRAM  01/2014   To evaluate possible CVA: EF 55-60%. GR 1 DD. No significant valvular lesions    Family History  Problem Relation Age of Onset  . Hypertension Mother        Does not know history  . Heart disease Mother   . Stroke Mother   . Diabetes Mother   . Hypertension Father   . Heart disease Father   . Stroke Father   . Diabetes Sister   . Hypertension Sister   . Heart disease Brother   . Hypertension Brother     Social History   Socioeconomic History  . Marital status: Widowed    Spouse name: Not on file  . Number of children: Not on file  . Years of education: Not on file  . Highest education level: Not on file  Occupational History    Comment: Disabled  Tobacco Use  . Smoking status: Some Days    Packs/day: 0.50    Years: 35.00    Total pack years: 17.50    Types: Cigarettes  . Smokeless tobacco: Never  Vaping Use  . Vaping Use: Never used  Substance and Sexual Activity  . Alcohol use: No  . Drug use: No  . Sexual activity: Not Currently  Other Topics Concern  . Not on file  Social History Narrative  . Not on file   Social Determinants of  Health   Financial Resource Strain: Low Risk  (10/08/2021)   Overall Financial Resource Strain (CARDIA)   . Difficulty of Paying Living Expenses: Not hard at all  Food Insecurity: No Food Insecurity (04/07/2022)   Hunger Vital Sign   . Worried About Charity fundraiser in the Last Year: Never true   . Ran Out of Food in the Last Year: Never true  Transportation Needs: No Transportation Needs (04/07/2022)   PRAPARE - Transportation   . Lack of Transportation (Medical): No   . Lack of Transportation (Non-Medical): No  Physical Activity: Insufficiently Active (04/07/2022)   Exercise Vital Sign   . Days of Exercise per Week: 2 days   . Minutes of Exercise per Session: 30  min  Stress: No Stress Concern Present (04/07/2022)   [place]   . Feeling of Stress : Not at all  Social Connections: Socially Isolated (04/07/2022)   Social Connection and Isolation Panel [NHANES]   . Frequency of Communication with Friends and Family: Three times a week   . Frequency of Social Gatherings with Friends and Family: Three times a week   . Attends Religious Services: Never   . Active Member of Clubs or Organizations: No   . Attends Archivist Meetings: Never   . Marital Status: Widowed  Intimate Partner Violence: Not At Risk (04/07/2022)   Humiliation, Afraid, Rape, and Kick questionnaire   . Fear of Current or Ex-Partner: No   . Emotionally Abused: No   . Physically Abused: No   . Sexually Abused: No    Outpatient Medications Prior to Visit  Medication Sig Dispense Refill  . acetaminophen (TYLENOL) 500 MG tablet Take 500 mg by mouth every 6 (six) hours as needed.    Marland Kitchen amLODipine (NORVASC) 10 MG tablet Take 1 tablet (10 mg total) by mouth daily. 90 tablet 3  . aspirin 81 MG tablet Take 81 mg by mouth daily.    . blood glucose meter kit and supplies KIT Dispense based on patient and insurance preference. Use two times daily as directed (before breakfast and at bedtime. (FOR ICD-10: E11.51) 1 each 0  . fenofibrate 54 MG tablet Take 1 tablet (54 mg total) by mouth daily. 90 tablet 3  . metFORMIN (GLUCOPHAGE) 850 MG tablet TAKE 1 TABLET BY MOUTH TWICE  DAILY WITH MEALS 200 tablet 2  . olmesartan (BENICAR) 40 MG tablet Take 1 tablet (40 mg total) by mouth daily. 90 tablet 3  . rosuvastatin (CRESTOR) 40 MG tablet Take 1 tablet (40 mg total) by mouth daily. 90 tablet 3  . sertraline (ZOLOFT) 100 MG tablet Take 1 tablet (100 mg total) by mouth daily. 90 tablet 3  . spironolactone (ALDACTONE) 25 MG tablet Take 1 tablet (25 mg total) by mouth daily. 90 tablet 1   No facility-administered medications prior to  visit.    No Known Allergies  ROS Review of Systems  Review of Systems  Respiratory:  Negative for shortness of breath.   Cardiovascular:  Negative for chest pain and palpitations.  Gastrointestinal:  Negative for constipation and diarrhea.  Genitourinary:  Negative for dysuria, frequency and urgency.  Musculoskeletal:  Negative for myalgias.  Psychiatric/Behavioral:  Negative for depression and suicidal ideas.   All other systems reviewed and are negative.    Objective:    Physical Exam  Gen: NAD, resting comfortably CV: RRR with no murmurs appreciated Pulm: NWOB, CTAB with no crackles, wheezes, or rhonchi  Skin: warm, dry Psych: Normal affect and thought content  BP (!) 120/58   Pulse 74   Temp 97.8 F (36.6 C)   Resp 16   Ht 6' 1" (1.854 m)   Wt 153 lb (69.4 kg)   SpO2 96%   BMI 20.19 kg/m  Wt Readings from Last 3 Encounters:  07/21/22 153 lb (69.4 kg)  02/11/22 156 lb 3.2 oz (70.9 kg)  01/21/22 158 lb (71.7 kg)     Health Maintenance Due  Topic Date Due  . COVID-19 Vaccine (1) Never done  . OPHTHALMOLOGY EXAM  Never done  . Zoster Vaccines- Shingrix (1 of 2) Never done  . Pneumonia Vaccine 39+ Years old (2 - PPSV23 or PCV20) 07/21/2021  . Fecal DNA (Cologuard)  03/04/2022  . FOOT EXAM  05/26/2022  . HEMOGLOBIN A1C  06/28/2022  . INFLUENZA VACCINE  07/06/2022    There are no preventive care reminders to display for this patient.  Lab Results  Component Value Date   TSH 2.50 11/21/2018   Lab Results  Component Value Date   WBC 9.9 06/27/2020   HGB 13.1 06/27/2020   HCT 39.4 06/27/2020   MCV 90.9 06/27/2020   PLT 282.0 06/27/2020   Lab Results  Component Value Date   NA 140 12/29/2021   K 3.9 12/29/2021   CO2 24 12/29/2021   GLUCOSE 120 (H) 12/29/2021   BUN 22 12/29/2021   CREATININE 1.05 12/29/2021   BILITOT 0.4 05/26/2021   ALKPHOS 67 05/26/2021   AST 9 05/26/2021   ALT 6 05/26/2021   PROT 6.8 05/26/2021   ALBUMIN 4.1 05/26/2021    CALCIUM 9.3 12/29/2021   ANIONGAP 8 10/02/2017   GFR 72.45 12/29/2021   Lab Results  Component Value Date   CHOL 131 12/29/2021   Lab Results  Component Value Date   HDL 39.30 12/29/2021   Lab Results  Component Value Date   LDLCALC 64 12/29/2021   Lab Results  Component Value Date   TRIG 143.0 12/29/2021   Lab Results  Component Value Date   CHOLHDL 3 12/29/2021   Lab Results  Component Value Date   HGBA1C 6.2 12/29/2021      Assessment & Plan:   Problem List Items Addressed This Visit   None   No orders of the defined types were placed in this encounter.   Follow-up: No follow-ups on file.    [name], FNP

## 2022-07-21 NOTE — Progress Notes (Signed)
Triglycerides 270, goal <150.  Kidney function slightly decreased, any urinary sx? Dysuria? Frequency?  We will repeat this x one month.

## 2022-07-21 NOTE — Patient Instructions (Addendum)
A referral was placed today for vascular surgery and for lung cancer screening.  Please let us know if you have not heard back within 2 weeks about the referral.  Complete xray(s) prior to leaving today. I will notify you of your results once received.   Wound culture today pending results.   Welcome to our clinic, I am happy to have you as my new patient. I am excited to continue on this healthcare journey with you.  Stop by the lab prior to leaving today. I will notify you of your results once received.   Please keep in mind Any my chart messages you send have up to a three business day turnaround for a response.  Phone calls may take up to a one full business day turnaround for a  response.   If you need a medication refill I recommend you request it through the pharmacy as this is easiest for [place] rather than sending a message and or phone call.   Due to recent changes in healthcare laws, you may see results of your imaging and/or laboratory studies on MyChart before I have had a chance to review them.  I understand that in some cases there may be results that are confusing or concerning to you. Please understand that not all results are received at the same time and often I may need to interpret multiple results in order to provide you with the best plan of care or course of treatment. Therefore, I ask that you please give me 2 business days to thoroughly review all your results before contacting my office for clarification. Should we see a critical lab result, you will be contacted sooner.   It was a pleasure seeing you today! Please do not hesitate to reach out with any questions and or concerns.  Regards,   [name] FNP-C

## 2022-07-22 ENCOUNTER — Other Ambulatory Visit: Payer: Self-pay | Admitting: Family

## 2022-07-22 ENCOUNTER — Other Ambulatory Visit: Payer: Self-pay | Admitting: Nurse Practitioner

## 2022-07-22 DIAGNOSIS — L02419 Cutaneous abscess of limb, unspecified: Secondary | ICD-10-CM | POA: Insufficient documentation

## 2022-07-22 DIAGNOSIS — I10 Essential (primary) hypertension: Secondary | ICD-10-CM

## 2022-07-22 DIAGNOSIS — R413 Other amnesia: Secondary | ICD-10-CM | POA: Insufficient documentation

## 2022-07-22 MED ORDER — AMOXICILLIN-POT CLAVULANATE 875-125 MG PO TABS: 1.0000 | ORAL_TABLET | Freq: Two times a day (BID) | ORAL | 0 refills | Status: AC

## 2022-07-22 NOTE — Assessment & Plan Note (Signed)
Referral to vascular

## 2022-07-22 NOTE — Assessment & Plan Note (Signed)
Work on low cholesterol diet, exercise as tolerated.

## 2022-07-22 NOTE — Assessment & Plan Note (Signed)
cxr today  Suspected copd R/o pneumonia

## 2022-07-22 NOTE — Assessment & Plan Note (Signed)
Wound culture ordered pending results to treat for drainage from abn skin growth

## 2022-07-22 NOTE — Assessment & Plan Note (Signed)
Smoking cessation instruction/counseling given:  counseled patient on the dangers of tobacco use, advised patient to stop smoking, and reviewed strategies to maximize success 

## 2022-07-22 NOTE — Assessment & Plan Note (Signed)
b12 folate and tsh ordered pending results Consider mmse mini cog next visit Possible neuro referral, did not have time in todays visit to assess fully

## 2022-07-22 NOTE — Assessment & Plan Note (Signed)
>>  ASSESSMENT AND PLAN FOR RECURRENT MAJOR DEPRESSIVE DISORDER, IN FULL REMISSION (HCC) WRITTEN ON 07/22/2022  1:33 PM BY DUGAL, TABITHA, FNP  Continue sertraline  100 mg

## 2022-07-22 NOTE — Assessment & Plan Note (Signed)
continue daily asa

## 2022-07-22 NOTE — Assessment & Plan Note (Signed)
Ordering a1c and b12 folate pending results likely r/t diabetes

## 2022-07-22 NOTE — Assessment & Plan Note (Addendum)
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

## 2022-07-22 NOTE — Assessment & Plan Note (Signed)
Continue spironolactone, amlodipine and olmesartan.  Pt advised of the following:  Continue medication as prescribed. Monitor blood pressure periodically and/or when you feel symptomatic. Goal is <130/90 on average. Ensure that you have rested for 30 minutes prior to checking your blood pressure. Record your readings and bring them to your next visit if necessary.work on a low sodium diet.

## 2022-07-22 NOTE — Assessment & Plan Note (Signed)
F/u with vascular

## 2022-07-22 NOTE — Assessment & Plan Note (Signed)
Continue sertraline 100 mg

## 2022-07-22 NOTE — Progress Notes (Signed)
Gram positive on wound. Will send in augmentin to start

## 2022-07-22 NOTE — Assessment & Plan Note (Signed)
Referral back to vascular, overdue for annual f/u and monitoring of h/o left femoral popliteal bypass with gore tex graft. Not currently with pain.

## 2022-07-22 NOTE — Assessment & Plan Note (Signed)
Continue metformin  Ordered hga1c today pending results. Work on diabetic diet and exercise as tolerated. Yearly foot exam, and annual eye exam.

## 2022-07-22 NOTE — Assessment & Plan Note (Signed)
>>  ASSESSMENT AND PLAN FOR HYPERTRIGLYCERIDEMIA WRITTEN ON 07/22/2022  1:32 PM BY DUGAL, TABITHA, FNP  Work on low cholesterol diet, exercise as tolerated.

## 2022-07-23 NOTE — Telephone Encounter (Signed)
Chart supports Rx Last OV: 02/2022 Next OV: not scheduled, pt needs a f/u appt for further refills.

## 2022-07-23 NOTE — Progress Notes (Signed)
Chest x-ray unremarkable

## 2022-07-24 LAB — WOUND CULTURE
MICRO NUMBER:: 13787855
SPECIMEN QUALITY:: ADEQUATE

## 2022-07-26 ENCOUNTER — Ambulatory Visit: Payer: Medicare Other | Admitting: Dermatology

## 2022-07-30 ENCOUNTER — Telehealth: Payer: Self-pay

## 2022-07-30 NOTE — Telephone Encounter (Signed)
Patient needs the referrals placed for Cardiology and Urology, please advise.

## 2022-07-30 NOTE — Telephone Encounter (Signed)
Sent  to provider but she is out of office until Monday 8/28

## 2022-07-30 NOTE — Telephone Encounter (Signed)
Patient's sister called in stating they were supposed to have several referrals placed but haven't heard from anyone. They state that the needed a referral to Cardiology and Urology in addition to the Pulmonology, Vein & Vascular and Derm. Provided the numbers to the other 3 and sister states she will call and schedule.  Cardiology [facility] Cardiology [facility])  Dr. Rogue Jury A. [place]  Urology - [facility] / Dr.Stoioff if possible Jordan Arellano states Jordan Arellano seen a urologist in the past many years ago and they wanted to monitor a spot on his kidney w/ follow ups but Hart stopped going and Jordan Arellano just to have this rechecked)

## 2022-08-02 NOTE — Telephone Encounter (Signed)
Pt has an appt for 9/5 they said he is confused and dermatology  called him and he refused. She wanted to know if you can put it back in so they can call and rescheduled. Sister is worried because he is confused. He was supposed to have eye surgery and called and canceled it. His sister ask why and he said there is nothing wrong with him, and everything looked good.

## 2022-08-02 NOTE — Telephone Encounter (Signed)
I placed referrals for vascular, and also pulmonary for lung cancer screening all of which state were authorized.   I have also placed a referral for dermatology but it states that pt refused the service so the referral was cancelled.   Lastly, we did not go over urology and or cardiology reasons during visit. These need to be documented, pt will need to come back in. We had a lot to go over first visit.

## 2022-08-02 NOTE — Telephone Encounter (Signed)
Please have pt come in office for visit so we can do MMSE and mini cog (dementia type evaluations)  Make sure this sister is on his call list also just to be safe.  We may have to tackle memory loss and or confusion prior to making all of this visits.

## 2022-08-03 NOTE — Telephone Encounter (Signed)
I just double checked and his sister in on [place].

## 2022-08-05 ENCOUNTER — Ambulatory Visit: Payer: Medicare Other | Admitting: Family

## 2022-08-10 ENCOUNTER — Encounter: Payer: Self-pay | Admitting: Family

## 2022-08-10 ENCOUNTER — Ambulatory Visit (INDEPENDENT_AMBULATORY_CARE_PROVIDER_SITE_OTHER): Payer: Medicare Other | Admitting: Family

## 2022-08-10 VITALS — BP 118/58 | HR 77 | Temp 98.0°F | Resp 16 | Ht 73.0 in | Wt 151.2 lb

## 2022-08-10 DIAGNOSIS — Z720 Tobacco use: Secondary | ICD-10-CM

## 2022-08-10 DIAGNOSIS — E781 Pure hyperglyceridemia: Secondary | ICD-10-CM

## 2022-08-10 DIAGNOSIS — D492 Neoplasm of unspecified behavior of bone, soft tissue, and skin: Secondary | ICD-10-CM

## 2022-08-10 DIAGNOSIS — H9193 Unspecified hearing loss, bilateral: Secondary | ICD-10-CM | POA: Diagnosis not present

## 2022-08-10 NOTE — Assessment & Plan Note (Signed)
Smoking cessation instruction/counseling given:  counseled patient on the dangers of tobacco use, advised patient to stop smoking, and reviewed strategies to maximize success 

## 2022-08-10 NOTE — Assessment & Plan Note (Signed)
Worsening Referral to audiologist. Suspect this may be partial to why pt appears to have memory concerns. MMSE and mini cog WNL today.

## 2022-08-10 NOTE — Assessment & Plan Note (Signed)
Has appt with dermatology tomorrow. Improvement with infection symptoms with antbx however still abn skin growth Suspected SCC

## 2022-08-10 NOTE — Assessment & Plan Note (Signed)
Handout given for low cholesterol diet.  Repeat lipid panel at next visit.  Work on [employer] exercise as tolerated.

## 2022-08-10 NOTE — Assessment & Plan Note (Signed)
>>  ASSESSMENT AND PLAN FOR HYPERTRIGLYCERIDEMIA WRITTEN ON 08/10/2022  1:40 PM BY DUGAL, TABITHA, FNP  Handout given for low cholesterol diet.  Repeat lipid panel at next visit.  Work on [employer] exercise as tolerated.

## 2022-08-10 NOTE — Patient Instructions (Addendum)
Make sure you are eating every two to three hours so that your blood sugar doesn't drop   When you follow up in three months come fasting so we can repeat your cholesterol.   A referral was placed today for audiology.  Please let us know if you have not heard back within 2 weeks about the referral.  Due to recent changes in healthcare laws, you may see results of your imaging and/or laboratory studies on MyChart before I have had a chance to review them.  I understand that in some cases there may be results that are confusing or concerning to you. Please understand that not all results are received at the same time and often I may need to interpret multiple results in order to provide you with the best plan of care or course of treatment. Therefore, I ask that you please give me 2 business days to thoroughly review all your results before contacting my office for clarification. Should we see a critical lab result, you will be contacted sooner.   It was a pleasure seeing you today! Please do not hesitate to reach out with any questions and or concerns.  Regards,   [name] FNP-C

## 2022-08-10 NOTE — Progress Notes (Signed)
Established Patient Office Visit  Subjective:  Patient ID: Jordan Arellano, male    DOB: [birth year]  Age: 71 y.o. MRN: [id number]  CC:  Chief Complaint  Patient presents with   Chronic Kidney Disease    HPI Tison Leibold is here today for follow up.   Pt is with acute concerns. Accompanied by sister, she is worried about increased confusion as of late.   Financial situation:   Growth on right mid anterior forearm, no loner with yellow discharge. Completed augmentin x 10 days and discharge and has ceased however still prominent and requiring eval from dermatologist as irregular borders.   At times will forget where he put different items and where he put them.  He was supposed to show up for dermatology appt 8/21, but sister stated they called him and asked why he wasn't there and he stated he didn't need the appt. When asked today, he doesn't remember having this conversation on the phone with them.    B12 was on lower end of normal, at 346. Not currently supplementing.   Positive urine microalbumin: pt stable on olmesartan.  In prediabetic range.   Past Medical History:  Diagnosis Date   Depression    Foot ulcer ([place]) 12/12/2016   Hyperlipidemia    Hypertension    PAD (peripheral artery disease) ([place]) ~2007   s/p R BKA for non-healing wound   Stroke ([place]) 03/2014   MRI: Acute nonhemorrhagic left paracentral pontine infarct. 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   TIA (transient ischemic attack) 01/2014    Past Surgical History:  Procedure Laterality Date   BELOW KNEE LEG AMPUTATION Right    FEMORAL-POPLITEAL BYPASS GRAFT Left 12/17/2016   Procedure: BYPASS LEFT FEMORAL TO BELOW POPLITEAL ARTERY USING PROPATEN GORE GRAFT;  Surgeon: Rosetta Posner, MD;  Location: [place];  Service: Vascular;  Laterality: Left;   FEMORAL-POPLITEAL BYPASS GRAFT Left 09/30/2017   Procedure: LEFT LEG ANGIOGRAM,   THROMBECTOMY, FEM-POPLITEAL BYPASS GRAFT, tHROMBECTOMY PERONEAL ARTERY AND POSTERIOR TIBIAL , ENDARTERECTOMY TIBIAL/PERONEAL TRUNK WITH BOVINE PATCH ANGIOPLASTY.;  Surgeon: Conrad Carrsville, MD;  Location: [place];  Service: Vascular;  Laterality: Left;   PERIPHERAL VASCULAR CATHETERIZATION Left 12/14/2016   Procedure: Abdominal Aortogram w/Lower Extremity;  Surgeon: Angelia Mould, MD;  Location: [place] [facility];  Service: Cardiovascular;  Laterality: Left;   right cataract extraction     TRANSTHORACIC ECHOCARDIOGRAM  01/2014   To evaluate possible CVA: EF 55-60%. GR 1 DD. No significant valvular lesions    Family History  Problem Relation Age of Onset   Hypertension Mother        Does not know history   Heart disease Mother    Stroke Mother    Diabetes Mother    Hypertension Father    Heart disease Father    Stroke Father    Diabetes Sister    Hypertension Sister    Heart disease Brother    Hypertension Brother     Social History   Socioeconomic History   Marital status: Widowed    Spouse name: Not on file   Number of children: 2   Years of education: Not on file   Highest education level: Not on file  Occupational History    Comment: Disabled  Tobacco Use   Smoking status: Some Days    Packs/day: 0.50    Years: 60.00    Total pack years: 30.00  Types: Cigarettes   Smokeless tobacco: Never  Vaping Use   Vaping Use: Never used  Substance and Sexual Activity   Alcohol use: No   Drug use: No   Sexual activity: Not Currently    Birth control/protection: Abstinence  Other Topics Concern   Not on file  Social History Narrative   One boy youngest, killed in MVA    Social Determinants of Health   Financial Resource Strain: Low Risk  (10/08/2021)   Overall Financial Resource Strain (CARDIA)    Difficulty of Paying Living Expenses: Not hard at all  Food Insecurity: No Food Insecurity (04/07/2022)   Hunger Vital Sign    Worried About Running Out of Food in the  Last Year: Never true    [place] in the Last Year: Never true  Transportation Needs: No Transportation Needs (04/07/2022)   PRAPARE - Hydrologist (Medical): No    Lack of Transportation (Non-Medical): No  Physical Activity: Insufficiently Active (04/07/2022)   Exercise Vital Sign    Days of Exercise per Week: 2 days    Minutes of Exercise per Session: 30 min  Stress: No Stress Concern Present (04/07/2022)   [place]    Feeling of Stress : Not at all  Social Connections: Socially Isolated (04/07/2022)   Social Connection and Isolation Panel [NHANES]    Frequency of Communication with Friends and Family: Three times a week    Frequency of Social Gatherings with Friends and Family: Three times a week    Attends Religious Services: Never    Active Member of Clubs or Organizations: No    Attends Archivist Meetings: Never    Marital Status: Widowed  Intimate Partner Violence: Not At Risk (04/07/2022)   Humiliation, Afraid, Rape, and Kick questionnaire    Fear of Current or Ex-Partner: No    Emotionally Abused: No    Physically Abused: No    Sexually Abused: No    Outpatient Medications Prior to Visit  Medication Sig Dispense Refill   acetaminophen (TYLENOL) 500 MG tablet Take 500 mg by mouth every 6 (six) hours as needed.     amLODipine (NORVASC) 10 MG tablet TAKE 1 TABLET BY MOUTH DAILY 100 tablet 2   aspirin 81 MG tablet Take 81 mg by mouth daily.     blood glucose meter kit and supplies KIT Dispense based on patient and insurance preference. Use two times daily as directed (before breakfast and at bedtime. (FOR ICD-10: E11.51) 1 each 0   fenofibrate 54 MG tablet Take 1 tablet (54 mg total) by mouth daily. 90 tablet 3   metFORMIN (GLUCOPHAGE) 850 MG tablet TAKE 1 TABLET BY MOUTH TWICE  DAILY WITH MEALS 200 tablet 2   olmesartan (BENICAR) 40 MG tablet TAKE 1 TABLET BY MOUTH DAILY  100 tablet 3   rosuvastatin (CRESTOR) 40 MG tablet Take 1 tablet (40 mg total) by mouth daily. 90 tablet 3   sertraline (ZOLOFT) 100 MG tablet Take 1 tablet (100 mg total) by mouth daily. 90 tablet 3   spironolactone (ALDACTONE) 25 MG tablet Take 1 tablet (25 mg total) by mouth daily. 90 tablet 1   No facility-administered medications prior to visit.    No Known Allergies     Objective:    Physical Exam Constitutional:      General: He is not in acute distress.    Appearance: Normal appearance. He is normal weight.  He is not ill-appearing, toxic-appearing or diaphoretic.  Cardiovascular:     Rate and Rhythm: Normal rate and regular rhythm.     Pulses: Normal pulses.     Heart sounds: No murmur heard. Pulmonary:     Effort: Pulmonary effort is normal.     Breath sounds: Decreased air movement present.  Neurological:     Mental Status: He is alert and oriented to person, place, and time. Mental status is at baseline.     Cranial Nerves: No cranial nerve deficit.     Sensory: Sensation is intact.     Motor: Motor function is intact. No weakness.     Coordination: Coordination is intact.     Gait: Gait is intact.     BP (!) 118/58   Pulse 77   Temp 98 F (36.7 C)   Resp 16   Ht $R'6\' 1"'Ci$  (1.854 m)   Wt 151 lb 4 oz (68.6 kg)   SpO2 97%   BMI 19.96 kg/m  Wt Readings from Last 3 Encounters:  08/10/22 151 lb 4 oz (68.6 kg)  07/21/22 153 lb (69.4 kg)  02/11/22 156 lb 3.2 oz (70.9 kg)     Health Maintenance Due  Topic Date Due   COVID-19 Vaccine (1) Never done   OPHTHALMOLOGY EXAM  Never done   Zoster Vaccines- Shingrix (1 of 2) Never done   Pneumonia Vaccine 81+ Years old (2 - PPSV23 or PCV20) 07/21/2021   Fecal DNA (Cologuard)  03/04/2022   FOOT EXAM  05/26/2022   INFLUENZA VACCINE  07/06/2022    There are no preventive care reminders to display for this patient.  Lab Results  Component Value Date   TSH 2.42 07/21/2022   Lab Results  Component Value Date    WBC 13.6 (H) 07/21/2022   HGB 12.8 (L) 07/21/2022   HCT 40.0 07/21/2022   MCV 96.0 07/21/2022   PLT 371.0 07/21/2022   Lab Results  Component Value Date   NA 137 07/21/2022   K 5.1 07/21/2022   CO2 24 07/21/2022   GLUCOSE 94 07/21/2022   BUN 29 (H) 07/21/2022   CREATININE 1.29 07/21/2022   BILITOT 0.4 07/21/2022   ALKPHOS 33 (L) 07/21/2022   AST 13 07/21/2022   ALT 8 07/21/2022   PROT 7.6 07/21/2022   ALBUMIN 4.5 07/21/2022   CALCIUM 9.5 07/21/2022   ANIONGAP 8 10/02/2017   GFR 56.37 (L) 07/21/2022   Lab Results  Component Value Date   CHOL 152 07/21/2022   Lab Results  Component Value Date   HDL 34.80 (L) 07/21/2022   Lab Results  Component Value Date   LDLCALC 64 12/29/2021   Lab Results  Component Value Date   TRIG 270.0 (H) 07/21/2022   Lab Results  Component Value Date   CHOLHDL 4 07/21/2022   Lab Results  Component Value Date   HGBA1C 6.4 07/21/2022      Assessment & Plan:   Problem List Items Addressed This Visit       Nervous and Auditory   Bilateral hearing loss - Primary    Worsening Referral to audiologist. Suspect this may be partial to why pt appears to have memory concerns. MMSE and mini cog WNL today.       Relevant Orders   Ambulatory referral to Audiology     Musculoskeletal and Integument   Abnormal skin growth    Has appt with dermatology tomorrow. Improvement with infection symptoms with antbx however still abn skin growth Suspected  SCC        Other   Tobacco abuse (Chronic)    Smoking cessation instruction/counseling given:  counseled patient on the dangers of tobacco use, advised patient to stop smoking, and reviewed strategies to maximize success        Hypertriglyceridemia (Chronic)    Handout given for low cholesterol diet.  Repeat lipid panel at next visit.  Work on [employer] exercise as tolerated.        No orders of the defined types were placed in this encounter.   Follow-up: Return in about 3  months (around 11/09/2022) for in office visit .    [name], FNP

## 2022-08-11 ENCOUNTER — Ambulatory Visit (INDEPENDENT_AMBULATORY_CARE_PROVIDER_SITE_OTHER): Payer: Medicare Other | Admitting: Dermatology

## 2022-08-11 DIAGNOSIS — C44612 Basal cell carcinoma of skin of right upper limb, including shoulder: Secondary | ICD-10-CM | POA: Diagnosis not present

## 2022-08-11 DIAGNOSIS — L578 Other skin changes due to chronic exposure to nonionizing radiation: Secondary | ICD-10-CM | POA: Diagnosis not present

## 2022-08-11 DIAGNOSIS — C4491 Basal cell carcinoma of skin, unspecified: Secondary | ICD-10-CM

## 2022-08-11 DIAGNOSIS — D492 Neoplasm of unspecified behavior of bone, soft tissue, and skin: Secondary | ICD-10-CM

## 2022-08-11 HISTORY — DX: Basal cell carcinoma of skin, unspecified: C44.91

## 2022-08-11 NOTE — Patient Instructions (Addendum)
Wound Care Instructions  Cleanse wound gently with soap and water once a day then pat dry with clean gauze. Apply a thin coat of Petrolatum (petroleum jelly, "Vaseline") over the wound (unless you have an allergy to this). We recommend that you use a new, sterile tube of Vaseline. Do not pick or remove scabs. Do not remove the yellow or white "healing tissue" from the base of the wound.  Cover the wound with fresh, clean, nonstick gauze and secure with paper tape. You may use Band-Aids in place of gauze and tape if the wound is small enough, but would recommend trimming much of the tape off as there is often too much. Sometimes Band-Aids can irritate the skin.  You should call the office for your biopsy report after 1 week if you have not already been contacted.  If you experience any problems, such as abnormal amounts of bleeding, swelling, significant bruising, significant pain, or evidence of infection, please call the office immediately.  FOR ADULT SURGERY PATIENTS: If you need something for pain relief you may take 1 extra strength Tylenol (acetaminophen) AND 2 Ibuprofen (200mg each) together every 4 hours as needed for pain. (do not take these if you are allergic to them or if you have a reason you should not take them.) Typically, you may only need pain medication for 1 to 3 days.     Due to recent changes in healthcare laws, you may see results of your pathology and/or laboratory studies on MyChart before the doctors have had a chance to review them. We understand that in some cases there may be results that are confusing or concerning to you. Please understand that not all results are received at the same time and often the doctors may need to interpret multiple results in order to provide you with the best plan of care or course of treatment. Therefore, we ask that you please give us 2 business days to thoroughly review all your results before contacting the office for clarification. Should  we see a critical lab result, you will be contacted sooner.   If You Need Anything After Your Visit  If you have any questions or concerns for your doctor, please call our main line at [phone] and press option 4 to reach your doctor's medical assistant. If no one answers, please leave a voicemail as directed and we will return your call as soon as possible. Messages left after 4 pm will be answered the following business day.   You may also send us a message via MyChart. We typically respond to MyChart messages within 1-2 business days.  For prescription refills, please ask your pharmacy to contact our office. Our fax number is [phone].  If you have an urgent issue when the clinic is closed that cannot wait until the next business day, you can page your doctor at the number below.    Please note that while we do our best to be available for urgent issues outside of office hours, we are not available 24/7.   If you have an urgent issue and are unable to reach us, you may choose to seek medical care at your doctor's office, retail clinic, urgent care center, or emergency room.  If you have a medical emergency, please immediately call 911 or go to the emergency department.  Pager Numbers  - Dr. Kowalski: [phone]  - Dr. Moye: [phone]  - Dr. Stewart: [phone]  In the event of inclement weather, please call our main line at   [phone] for an update on the status of any delays or closures.  Dermatology Medication Tips: Please keep the boxes that topical medications come in in order to help keep track of the instructions about where and how to use these. Pharmacies typically print the medication instructions only on the boxes and not directly on the medication tubes.   If your medication is too expensive, please contact our office at [phone] option 4 or send us a message through MyChart.   We are unable to tell what your co-pay for medications will be in  advance as this is different depending on your insurance coverage. However, we may be able to find a substitute medication at lower cost or fill out paperwork to get insurance to cover a needed medication.   If a prior authorization is required to get your medication covered by your insurance company, please allow us 1-2 business days to complete this process.  Drug prices often vary depending on where the prescription is filled and some pharmacies may offer cheaper prices.  The website www.goodrx.com contains coupons for medications through different pharmacies. The prices here do not account for what the cost may be with help from insurance (it may be cheaper with your insurance), but the website can give you the price if you did not use any insurance.  - You can print the associated coupon and take it with your prescription to the pharmacy.  - You may also stop by our office during regular business hours and pick up a GoodRx coupon card.  - If you need your prescription sent electronically to a different pharmacy, notify our office through [facility] MyChart or by phone at [phone] option 4.     Si Usted Necesita Algo Despus de Su Visita  Tambin puede enviarnos un mensaje a travs de MyChart. Por lo general respondemos a los mensajes de MyChart en el transcurso de 1 a 2 das hbiles.  Para renovar recetas, por favor pida a su farmacia que se ponga en contacto con nuestra oficina. Nuestro nmero de fax es el [phone].  Si tiene un asunto urgente cuando la clnica est cerrada y que no puede esperar hasta el siguiente da hbil, puede llamar/localizar a su doctor(a) al nmero que aparece a continuacin.   Por favor, tenga en cuenta que aunque hacemos todo lo posible para estar disponibles para asuntos urgentes fuera del horario de oficina, no estamos disponibles las 24 horas del da, los 7 das de la semana.   Si tiene un problema urgente y no puede comunicarse con nosotros, puede  optar por buscar atencin mdica  en el consultorio de su doctor(a), en una clnica privada, en un centro de atencin urgente o en una sala de emergencias.  Si tiene una emergencia mdica, por favor llame inmediatamente al 911 o vaya a la sala de emergencias.  Nmeros de bper  - Dr. Kowalski: [phone]  - Dra. Moye: [phone]  - Dra. Stewart: [phone]  En caso de inclemencias del tiempo, por favor llame a nuestra lnea principal al [phone] para una actualizacin sobre el estado de cualquier retraso o cierre.  Consejos para la medicacin en dermatologa: Por favor, guarde las cajas en las que vienen los medicamentos de uso tpico para ayudarle a seguir las instrucciones sobre dnde y cmo usarlos. Las farmacias generalmente imprimen las instrucciones del medicamento slo en las cajas y no directamente en los tubos del medicamento.   Si su medicamento es muy caro, por favor, pngase en contacto con   nuestra oficina llamando al [phone] y presione la opcin 4 o envenos un mensaje a travs de MyChart.   No podemos decirle cul ser su copago por los medicamentos por adelantado ya que esto es diferente dependiendo de la cobertura de su seguro. Sin embargo, es posible que podamos encontrar un medicamento sustituto a menor costo o llenar un formulario para que el seguro cubra el medicamento que se considera necesario.   Si se requiere una autorizacin previa para que su compaa de seguros cubra su medicamento, por favor permtanos de 1 a 2 das hbiles para completar este proceso.  Los precios de los medicamentos varan con frecuencia dependiendo del lugar de dnde se surte la receta y alguna farmacias pueden ofrecer precios ms baratos.  El sitio web www.goodrx.com tiene cupones para medicamentos de diferentes farmacias. Los precios aqu no tienen en cuenta lo que podra costar con la ayuda del seguro (puede ser ms barato con su seguro), pero el sitio web puede darle el  precio si no utiliz ningn seguro.  - Puede imprimir el cupn correspondiente y llevarlo con su receta a la farmacia.  - Tambin puede pasar por nuestra oficina durante el horario de atencin regular y recoger una tarjeta de cupones de GoodRx.  - Si necesita que su receta se enve electrnicamente a una farmacia diferente, informe a nuestra oficina a travs de MyChart de [facility] o por telfono llamando al [phone] y presione la opcin 4.  

## 2022-08-11 NOTE — Progress Notes (Signed)
   New Patient Visit  Subjective  Jordan Arellano is a 71 y.o. male who presents for the following: Skin Problem (Check a growth on the right arm appeared ~4 months growing and changing. ). The patient has spots, moles and lesions to be evaluated, some may be new or changing and the patient has concerns that these could be cancer.  The following portions of the chart were reviewed this encounter and updated as appropriate:   Tobacco  Allergies  Meds  Problems  Med Hx  Surg Hx  Fam Hx     Review of Systems:  No other skin or systemic complaints except as noted in HPI or Assessment and Plan.  Objective  Well appearing patient in no apparent distress; mood and affect are within normal limits.  A focused examination was performed including right forearm. Relevant physical exam findings are noted in the Assessment and Plan.  Right Forearm 2.5 cm keratotic papule           Assessment & Plan  Neoplasm of skin Right Forearm Skin excision followed by destruction with EDC  Total excision diameter (cm):  2.5 Informed consent: discussed and consent obtained   Timeout: patient name, date of birth, surgical site, and procedure verified   Procedure prep:  Patient was prepped and draped in usual sterile fashion Prep type:  Isopropyl alcohol and povidone-iodine Anesthesia: the lesion was anesthetized in a standard fashion   Anesthetic:  1% lidocaine w/ epinephrine 1-100,000 buffered w/ 8.4% NaHCO3 Instrument used: #15 blade   Hemostasis achieved with: pressure   Hemostasis achieved with comment:  Electrocautery Outcome: patient tolerated procedure well with no complications   Post-procedure details: sterile dressing applied and wound care instructions given   Dressing type: bandage and pressure dressing (Mupirocin)    Specimen 1 - Surgical pathology Differential Diagnosis: R/O SCC  Check Margins: No  Actinic Damage - chronic, secondary to cumulative UV radiation exposure/sun  exposure over time - diffuse scaly erythematous macules with underlying dyspigmentation - Recommend daily broad spectrum sunscreen SPF 30+ to sun-exposed areas, reapply every 2 hours as needed.  - Recommend staying in the shade or wearing long sleeves, sun glasses (UVA+UVB protection) and wide brim hats (4-inch brim around the entire circumference of the hat). - Call for new or changing lesions. Will re-evaluate other spots next visit and determine treatment.  Return in about 6 weeks (around 09/22/2022) for recheck biopsy site.  IMarye Round, CMA, am acting as scribe for Sarina Ser, MD .  Documentation: I have reviewed the above documentation for accuracy and completeness, and I agree with the above.  Sarina Ser, MD

## 2022-08-13 ENCOUNTER — Encounter: Payer: Self-pay | Admitting: Dermatology

## 2022-08-17 ENCOUNTER — Telehealth: Payer: Self-pay

## 2022-08-17 NOTE — Telephone Encounter (Signed)
Left voicemail to return my call

## 2022-08-17 NOTE — Telephone Encounter (Signed)
-----   Message from Ralene Bathe, MD sent at 08/12/2022  5:56 PM EDT ----- Diagnosis Skin (M), right forearm BASAL CELL CARCINOMA, NODULAR PATTERN, DEEP MARGIN INVOLVED  Cancer - BCC Already treated  Recheck next visit

## 2022-08-18 ENCOUNTER — Ambulatory Visit: Payer: Medicare Other | Attending: Audiologist | Admitting: Audiologist

## 2022-08-18 ENCOUNTER — Telehealth: Payer: Self-pay

## 2022-08-18 DIAGNOSIS — H401121 Primary open-angle glaucoma, left eye, mild stage: Secondary | ICD-10-CM | POA: Diagnosis not present

## 2022-08-18 DIAGNOSIS — H43811 Vitreous degeneration, right eye: Secondary | ICD-10-CM | POA: Diagnosis not present

## 2022-08-18 DIAGNOSIS — H90A31 Mixed conductive and sensorineural hearing loss, unilateral, right ear with restricted hearing on the contralateral side: Secondary | ICD-10-CM | POA: Diagnosis not present

## 2022-08-18 DIAGNOSIS — H401113 Primary open-angle glaucoma, right eye, severe stage: Secondary | ICD-10-CM | POA: Diagnosis not present

## 2022-08-18 DIAGNOSIS — H2512 Age-related nuclear cataract, left eye: Secondary | ICD-10-CM | POA: Diagnosis not present

## 2022-08-18 DIAGNOSIS — H9311 Tinnitus, right ear: Secondary | ICD-10-CM | POA: Insufficient documentation

## 2022-08-18 DIAGNOSIS — H90A22 Sensorineural hearing loss, unilateral, left ear, with restricted hearing on the contralateral side: Secondary | ICD-10-CM | POA: Diagnosis not present

## 2022-08-18 NOTE — Procedures (Signed)
  Out[facility] and [facility] [place], [place]  [id number] [phone]  AUDIOLOGICAL  EVALUATION  NAME: Jordan Arellano     DOB:   02-Jul-1951      MRN: [id number]                                                                                     DATE: 08/18/2022     REFERENT: [name], FNP STATUS: Outpatient DIAGNOSIS: Mixed Hearing Loss Right Ear, Sensorineural Hearing Loss Left Ear.    History: [name] was seen for an audiological evaluation. [name] was accompanied to the appointment by his sister Jordan Arellano who is his primary care taker and organized his medical care.  [name] is receiving a hearing evaluation due to concerns for difficulty hearing for many years. [name] has difficulty hearing in the right ear. This difficulty began suddenly years ago. He says he woke up one morning and could not hear from his right ear. He saw a provider for a hearing test who wanted to fit him with a hearing aid but he could not afford it, but he does not think they recommended he see Otolaryngology. No pain or pressure reported in either ear. Tinnitus present in his right ear only.  [name] has a history of noise exposure from playing music.  Medical history positive for stroke in left hemisphere six years ago, and diabetic neuropathy which are risk factors for hearing loss. No other relevant case history reported.   Evaluation:  Otoscopy showed a clear view of the tympanic membrane in the left ear and no view due to cerumen in right ear Tympanometry results were consistent with normal middle ear function in each ear, showing sound is bypassing wax present in right ear Audiometric testing was completed using conventional audiometry with supraural  transducer due to cerumen. Pure tone thresholds show mixed moderate hearing loss in the right ear with conductive component from 250-1kHz and  normal sloping to moderate sensorineural hearing loss in the left ear. See audiogram  below.   Speech Recognition Thresholds were 60dB in the right ear and 35dB in the left ear over air conduction, SRT 40dB over bone in right ear with masking. Word Recognition was  performed using recorded NU-6 list at 90dB in right ear, scored 52%, and at 75dB in the left ear, scored 100%.    Results:  The test results were reviewed with [facility] and his sister. The right ear hearing loss warrants evaluation by Otolaryngology. [name] also needs the wax removed from that ear. [name] and Jordan Arellano reported understanding. Jordan Arellano lives in [place]. Referral to [facility] to be requested and HIPPA release signed. Report and audiogram will be faxed to [facility] and referral requested from PCP, [name], [place].      Recommendations: Referral to ENT Physician necessary due to asymmetric hearing loss with conductive component in right ear. Also [name] needs cerumen removal.   [name], FNP: Sister requested referral to [facility] fax: [phone], phone: [phone]   54 minutes spent testing and counseling on results.   Alfonse Alpers  Audiologist, Au.D., CCC-A 08/18/2022  11:43 AM  Cc: [name], FNP

## 2022-08-18 NOTE — Telephone Encounter (Signed)
Went over results with patients sister Webb Silversmith. She called back for Marlyn. AF

## 2022-08-18 NOTE — Telephone Encounter (Signed)
-----   Message from Ralene Bathe, MD sent at 08/12/2022  5:56 PM EDT ----- Diagnosis Skin (M), right forearm BASAL CELL CARCINOMA, NODULAR PATTERN, DEEP MARGIN INVOLVED  Cancer - BCC Already treated  Recheck next visit

## 2022-08-19 ENCOUNTER — Other Ambulatory Visit: Payer: Self-pay | Admitting: Family

## 2022-08-19 ENCOUNTER — Telehealth: Payer: Self-pay | Admitting: Family

## 2022-08-19 DIAGNOSIS — H9011 Conductive hearing loss, unilateral, right ear, with unrestricted hearing on the contralateral side: Secondary | ICD-10-CM

## 2022-08-19 DIAGNOSIS — H6123 Impacted cerumen, bilateral: Secondary | ICD-10-CM | POA: Insufficient documentation

## 2022-08-19 NOTE — Telephone Encounter (Signed)
Reviewed audiology note, referral placed for [facility].

## 2022-08-19 NOTE — Telephone Encounter (Signed)
Patient sister called in regarding a referral that the audiologist sent over to you for patient to go to [facility]. Thank you!

## 2022-08-19 NOTE — Telephone Encounter (Signed)
Do you need to see back in office for follow up or can you send in referral. See not from audiology in chart.

## 2022-08-20 NOTE — Telephone Encounter (Signed)
Called and informed sister about the referral.

## 2022-08-30 ENCOUNTER — Ambulatory Visit (INDEPENDENT_AMBULATORY_CARE_PROVIDER_SITE_OTHER): Payer: Medicare Other | Admitting: Vascular Surgery

## 2022-08-30 ENCOUNTER — Encounter (INDEPENDENT_AMBULATORY_CARE_PROVIDER_SITE_OTHER): Payer: Self-pay | Admitting: Vascular Surgery

## 2022-08-30 VITALS — BP 125/66 | HR 79 | Resp 17 | Ht 73.0 in | Wt 152.0 lb

## 2022-08-30 DIAGNOSIS — E1151 Type 2 diabetes mellitus with diabetic peripheral angiopathy without gangrene: Secondary | ICD-10-CM

## 2022-08-30 DIAGNOSIS — E781 Pure hyperglyceridemia: Secondary | ICD-10-CM

## 2022-08-30 DIAGNOSIS — I70229 Atherosclerosis of native arteries of extremities with rest pain, unspecified extremity: Secondary | ICD-10-CM | POA: Diagnosis not present

## 2022-08-30 DIAGNOSIS — I10 Essential (primary) hypertension: Secondary | ICD-10-CM

## 2022-08-30 DIAGNOSIS — I701 Atherosclerosis of renal artery: Secondary | ICD-10-CM | POA: Diagnosis not present

## 2022-08-30 NOTE — Progress Notes (Unsigned)
MRN : [id number]  Jordan Arellano is a 71 y.o. (10-28-1951) male who presents with chief complaint of check circulation.  History of Present Illness:    He has an extensive past history.  He is undergone a right below-knee amputation greater than about 14 years ago for nonhealing wounds on his right foot.  He presented with nonhealing wounds on his left foot and underwent left femoral to below-knee popliteal bypass with Gore-Tex graft by Dr Donnetta Hutching in January 2018.  He did not have adequate saphenous vein for bypass.  Eventually he healed his left foot.  He presented in October 2018 with occlusion of his bypass and underwent thrombectomy and revision of this with Dr.Chen.  He presents today with the complaint of phantom pains.  Had not been seen following his surgery at [facility] or here at a [facility] in over 4 years.  He does walk with a right below-knee prosthesis and a cane.  He fortunately has had no tissue loss.  He unfortunately continues to smoke cigarettes.  No outpatient medications have been marked as taking for the 08/30/22 encounter (Appointment) with Delana Meyer, Dolores Lory, MD.    Past Medical History:  Diagnosis Date   Basal cell carcinoma 08/11/2022   R forearm - ED&C   Depression    Foot ulcer ([place]) 12/12/2016   Hyperlipidemia    Hypertension    PAD (peripheral artery disease) ([place]) ~2007   s/p R BKA for non-healing wound   Stroke ([place]) 03/2014   MRI: Acute nonhemorrhagic left paracentral pontine infarct. 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   TIA (transient ischemic attack) 01/2014    Past Surgical History:  Procedure Laterality Date   BELOW KNEE LEG AMPUTATION Right    FEMORAL-POPLITEAL BYPASS GRAFT Left 12/17/2016   Procedure: BYPASS LEFT FEMORAL TO BELOW POPLITEAL ARTERY USING PROPATEN GORE GRAFT;  Surgeon: Rosetta Posner, MD;  Location: [facility];  Service: Vascular;  Laterality: Left;    FEMORAL-POPLITEAL BYPASS GRAFT Left 09/30/2017   Procedure: LEFT LEG ANGIOGRAM,  THROMBECTOMY, FEM-POPLITEAL BYPASS GRAFT, tHROMBECTOMY PERONEAL ARTERY AND POSTERIOR TIBIAL , ENDARTERECTOMY TIBIAL/PERONEAL TRUNK WITH BOVINE PATCH ANGIOPLASTY.;  Surgeon: Conrad Revere, MD;  Location: [facility];  Service: Vascular;  Laterality: Left;   PERIPHERAL VASCULAR CATHETERIZATION Left 12/14/2016   Procedure: Abdominal Aortogram w/Lower Extremity;  Surgeon: Angelia Mould, MD;  Location: [facility];  Service: Cardiovascular;  Laterality: Left;   right cataract extraction     TRANSTHORACIC ECHOCARDIOGRAM  01/2014   To evaluate possible CVA: EF 55-60%. GR 1 DD. No significant valvular lesions    Social History Social History   Tobacco Use   Smoking status: Some Days    Packs/day: 0.50    Years: 60.00    Total pack years: 30.00    Types: Cigarettes   Smokeless tobacco: Never  Vaping Use   Vaping Use: Never used  Substance Use Topics   Alcohol use: No   Drug use: No    Family History Family History  Problem Relation Age of Onset   Hypertension Mother        Does not know history   Heart disease Mother    Stroke Mother    Diabetes Mother    Hypertension Father    Heart disease Father    Stroke Father    Diabetes Sister  Hypertension Sister    Heart disease Brother    Hypertension Brother     No Known Allergies   REVIEW OF SYSTEMS (Negative unless checked)  Constitutional: '[]'$ Weight loss  '[]'$ Fever  '[]'$ Chills Cardiac: '[]'$ Chest pain   '[]'$ Chest pressure   '[]'$ Palpitations   '[]'$ Shortness of breath when laying flat   '[]'$ Shortness of breath with exertion. Vascular:  '[x]'$ Pain in legs with walking   '[]'$ Pain in legs at rest  '[]'$ History of DVT   '[]'$ Phlebitis   '[]'$ Swelling in legs   '[]'$ Varicose veins   '[]'$ Non-healing ulcers Pulmonary:   '[]'$ Uses home oxygen   '[]'$ Productive cough   '[]'$ Hemoptysis   '[]'$ Wheeze  '[]'$ COPD   '[]'$ Asthma Neurologic:  '[]'$ Dizziness   '[]'$ Seizures   '[]'$ History of stroke   '[]'$ History of  TIA  '[]'$ Aphasia   '[]'$ Vissual changes   '[]'$ Weakness or numbness in arm   '[]'$ Weakness or numbness in leg Musculoskeletal:   '[]'$ Joint swelling   '[]'$ Joint pain   '[]'$ Low back pain Hematologic:  '[]'$ Easy bruising  '[]'$ Easy bleeding   '[]'$ Hypercoagulable state   '[]'$ Anemic Gastrointestinal:  '[]'$ Diarrhea   '[]'$ Vomiting  '[]'$ Gastroesophageal reflux/heartburn   '[]'$ Difficulty swallowing. Genitourinary:  '[]'$ Chronic kidney disease   '[]'$ Difficult urination  '[]'$ Frequent urination   '[]'$ Blood in urine Skin:  '[]'$ Rashes   '[]'$ Ulcers  Psychological:  '[]'$ History of anxiety   '[]'$  History of major depression.  Physical Examination  There were no vitals filed for this visit. There is no height or weight on file to calculate BMI. Gen: WD/WN, NAD Head: [place]/AT, No temporalis wasting.  Ear/Nose/Throat: Hearing grossly intact, nares w/o erythema or drainage Eyes: PER, EOMI, sclera nonicteric.  Neck: Supple, no masses.  No bruit or JVD.  Pulmonary:  Good air movement, no audible wheezing, no use of accessory muscles.  Cardiac: RRR, normal S1, S2, no Murmurs. Vascular:  mild trophic changes, no open wounds Vessel Right Left  Radial Palpable Palpable  PT Not Palpable Not Palpable  DP Not Palpable Not Palpable  Gastrointestinal: soft, non-distended. No guarding/no peritoneal signs.  Musculoskeletal: M/S 5/5 throughout.  No visible deformity.  Neurologic: CN 2-12 intact. Pain and light touch intact in extremities.  Symmetrical.  Speech is fluent. Motor exam as listed above. Psychiatric: Judgment intact, Mood & affect appropriate for pt's clinical situation. Dermatologic: No rashes or ulcers noted.  No changes consistent with cellulitis.   CBC Lab Results  Component Value Date   WBC 13.6 (H) 07/21/2022   HGB 12.8 (L) 07/21/2022   HCT 40.0 07/21/2022   MCV 96.0 07/21/2022   PLT 371.0 07/21/2022    BMET    Component Value Date/Time   NA 137 07/21/2022 1209   NA 139 02/03/2016 0000   K 5.1 07/21/2022 1209   CL 104 07/21/2022 1209   CO2  24 07/21/2022 1209   GLUCOSE 94 07/21/2022 1209   BUN 29 (H) 07/21/2022 1209   BUN 28 (A) 02/03/2016 0000   CREATININE 1.29 07/21/2022 1209   CALCIUM 9.5 07/21/2022 1209   GFRNONAA >60 10/02/2017 0943   GFRAA >60 10/02/2017 0943   CrCl cannot be calculated (Patient's most recent lab result is older than the maximum 21 days allowed.).  COAG Lab Results  Component Value Date   INR 1.00 09/30/2017   INR 1.08 12/14/2016   INR 1.01 03/25/2014    Radiology No results found.   Assessment/Plan There are no diagnoses linked to this encounter.   Hortencia Pilar, MD  08/30/2022 1:05 PM

## 2022-08-31 ENCOUNTER — Encounter (INDEPENDENT_AMBULATORY_CARE_PROVIDER_SITE_OTHER): Payer: Self-pay | Admitting: Vascular Surgery

## 2022-08-31 ENCOUNTER — Other Ambulatory Visit: Payer: Self-pay | Admitting: Nurse Practitioner

## 2022-08-31 DIAGNOSIS — I10 Essential (primary) hypertension: Secondary | ICD-10-CM

## 2022-08-31 NOTE — Telephone Encounter (Signed)
Pt needs f/u appt for further refills, sent in a 30 day supply on 07/23/22.

## 2022-09-14 ENCOUNTER — Other Ambulatory Visit: Payer: Self-pay | Admitting: Nurse Practitioner

## 2022-09-14 DIAGNOSIS — E781 Pure hyperglyceridemia: Secondary | ICD-10-CM

## 2022-09-15 ENCOUNTER — Ambulatory Visit (INDEPENDENT_AMBULATORY_CARE_PROVIDER_SITE_OTHER): Payer: Medicare Other | Admitting: Dermatology

## 2022-09-15 DIAGNOSIS — L821 Other seborrheic keratosis: Secondary | ICD-10-CM | POA: Diagnosis not present

## 2022-09-15 DIAGNOSIS — L578 Other skin changes due to chronic exposure to nonionizing radiation: Secondary | ICD-10-CM

## 2022-09-15 DIAGNOSIS — Z85828 Personal history of other malignant neoplasm of skin: Secondary | ICD-10-CM | POA: Diagnosis not present

## 2022-09-15 NOTE — Progress Notes (Signed)
   Follow-Up Visit   Subjective  Jordan Arellano is a 71 y.o. male who presents for the following: BCC bx proven txted with EDC at time of bx (R forearm, 6wk f/u). The patient has spots, moles and lesions to be evaluated, some may be new or changing and the patient has concerns that these could be cancer.   The following portions of the chart were reviewed this encounter and updated as appropriate:   Tobacco  Allergies  Meds  Problems  Med Hx  Surg Hx  Fam Hx      Review of Systems:  No other skin or systemic complaints except as noted in HPI or Assessment and Plan.  Objective  Well appearing patient in no apparent distress; mood and affect are within normal limits.  A focused examination was performed including R forearm, face, trunk. Relevant physical exam findings are noted in the Assessment and Plan.  Right Forearm - Anterior Healing excoriation   Assessment & Plan   Seborrheic Keratoses - Stuck-on, waxy, tan-brown papules and/or plaques  - Benign-appearing - Discussed benign etiology and prognosis. - Observe - Call for any changes  Actinic Damage - chronic, secondary to cumulative UV radiation exposure/sun exposure over time - diffuse scaly erythematous macules with underlying dyspigmentation - Recommend daily broad spectrum sunscreen SPF 30+ to sun-exposed areas, reapply every 2 hours as needed.  - Recommend staying in the shade or wearing long sleeves, sun glasses (UVA+UVB protection) and wide brim hats (4-inch brim around the entire circumference of the hat). - Call for new or changing lesions.   History of basal cell carcinoma (BCC) Right Forearm - Anterior  Healing wound. Observe for recurrence. Call clinic for new or changing lesions.  Recommend regular skin exams, daily broad-spectrum spf 30+ sunscreen use, and photoprotection.    Mechanical debridement today.  Wound cleansed with Puracyn, Mupirocin oint applied followed by telfa and coban.   Return in  about 6 months (around 03/17/2023) for UBSE, Hx of BCC.  I, Othelia Pulling, RMA, am acting as scribe for Sarina Ser, MD . Documentation: I have reviewed the above documentation for accuracy and completeness, and I agree with the above.  Sarina Ser, MD

## 2022-09-15 NOTE — Patient Instructions (Signed)
Due to recent changes in healthcare laws, you may see results of your pathology and/or laboratory studies on MyChart before the doctors have had a chance to review them. We understand that in some cases there may be results that are confusing or concerning to you. Please understand that not all results are received at the same time and often the doctors may need to interpret multiple results in order to provide you with the best plan of care or course of treatment. Therefore, we ask that you please give us 2 business days to thoroughly review all your results before contacting the office for clarification. Should we see a critical lab result, you will be contacted sooner.   If You Need Anything After Your Visit  If you have any questions or concerns for your doctor, please call our main line at [phone] and press option 4 to reach your doctor's medical assistant. If no one answers, please leave a voicemail as directed and we will return your call as soon as possible. Messages left after 4 pm will be answered the following business day.   You may also send us a message via MyChart. We typically respond to MyChart messages within 1-2 business days.  For prescription refills, please ask your pharmacy to contact our office. Our fax number is [phone].  If you have an urgent issue when the clinic is closed that cannot wait until the next business day, you can page your doctor at the number below.    Please note that while we do our best to be available for urgent issues outside of office hours, we are not available 24/7.   If you have an urgent issue and are unable to reach us, you may choose to seek medical care at your doctor's office, retail clinic, urgent care center, or emergency room.  If you have a medical emergency, please immediately call 911 or go to the emergency department.  Pager Numbers  - Dr. Kowalski: [phone]  - Dr. Moye: [phone]  - Dr. Stewart:  [phone]  In the event of inclement weather, please call our main line at [phone] for an update on the status of any delays or closures.  Dermatology Medication Tips: Please keep the boxes that topical medications come in in order to help keep track of the instructions about where and how to use these. Pharmacies typically print the medication instructions only on the boxes and not directly on the medication tubes.   If your medication is too expensive, please contact our office at [phone] option 4 or send us a message through MyChart.   We are unable to tell what your co-pay for medications will be in advance as this is different depending on your insurance coverage. However, we may be able to find a substitute medication at lower cost or fill out paperwork to get insurance to cover a needed medication.   If a prior authorization is required to get your medication covered by your insurance company, please allow us 1-2 business days to complete this process.  Drug prices often vary depending on where the prescription is filled and some pharmacies may offer cheaper prices.  The website www.goodrx.com contains coupons for medications through different pharmacies. The prices here do not account for what the cost may be with help from insurance (it may be cheaper with your insurance), but the website can give you the price if you did not use any insurance.  - You can print the associated coupon and take it with   your prescription to the pharmacy.  - You may also stop by our office during regular business hours and pick up a GoodRx coupon card.  - If you need your prescription sent electronically to a different pharmacy, notify our office through [facility] MyChart or by phone at [phone] option 4.     Si Usted Necesita Algo Despus de Su Visita  Tambin puede enviarnos un mensaje a travs de MyChart. Por lo general respondemos a los mensajes de MyChart en el transcurso de 1 a 2  das hbiles.  Para renovar recetas, por favor pida a su farmacia que se ponga en contacto con nuestra oficina. Nuestro nmero de fax es el [phone].  Si tiene un asunto urgente cuando la clnica est cerrada y que no puede esperar hasta el siguiente da hbil, puede llamar/localizar a su doctor(a) al nmero que aparece a continuacin.   Por favor, tenga en cuenta que aunque hacemos todo lo posible para estar disponibles para asuntos urgentes fuera del horario de oficina, no estamos disponibles las 24 horas del da, los 7 das de la semana.   Si tiene un problema urgente y no puede comunicarse con nosotros, puede optar por buscar atencin mdica  en el consultorio de su doctor(a), en una clnica privada, en un centro de atencin urgente o en una sala de emergencias.  Si tiene una emergencia mdica, por favor llame inmediatamente al 911 o vaya a la sala de emergencias.  Nmeros de bper  - Dr. Kowalski: [phone]  - Dra. Moye: [phone]  - Dra. Stewart: [phone]  En caso de inclemencias del tiempo, por favor llame a nuestra lnea principal al [phone] para una actualizacin sobre el estado de cualquier retraso o cierre.  Consejos para la medicacin en dermatologa: Por favor, guarde las cajas en las que vienen los medicamentos de uso tpico para ayudarle a seguir las instrucciones sobre dnde y cmo usarlos. Las farmacias generalmente imprimen las instrucciones del medicamento slo en las cajas y no directamente en los tubos del medicamento.   Si su medicamento es muy caro, por favor, pngase en contacto con nuestra oficina llamando al [phone] y presione la opcin 4 o envenos un mensaje a travs de MyChart.   No podemos decirle cul ser su copago por los medicamentos por adelantado ya que esto es diferente dependiendo de la cobertura de su seguro. Sin embargo, es posible que podamos encontrar un medicamento sustituto a menor costo o llenar un formulario para que el  seguro cubra el medicamento que se considera necesario.   Si se requiere una autorizacin previa para que su compaa de seguros cubra su medicamento, por favor permtanos de 1 a 2 das hbiles para completar este proceso.  Los precios de los medicamentos varan con frecuencia dependiendo del lugar de dnde se surte la receta y alguna farmacias pueden ofrecer precios ms baratos.  El sitio web www.goodrx.com tiene cupones para medicamentos de diferentes farmacias. Los precios aqu no tienen en cuenta lo que podra costar con la ayuda del seguro (puede ser ms barato con su seguro), pero el sitio web puede darle el precio si no utiliz ningn seguro.  - Puede imprimir el cupn correspondiente y llevarlo con su receta a la farmacia.  - Tambin puede pasar por nuestra oficina durante el horario de atencin regular y recoger una tarjeta de cupones de GoodRx.  - Si necesita que su receta se enve electrnicamente a una farmacia diferente, informe a nuestra oficina a travs de MyChart de [place]   o por telfono llamando al [phone] y presione la opcin 4.  

## 2022-09-16 DIAGNOSIS — H903 Sensorineural hearing loss, bilateral: Secondary | ICD-10-CM | POA: Diagnosis not present

## 2022-09-16 DIAGNOSIS — H6121 Impacted cerumen, right ear: Secondary | ICD-10-CM | POA: Diagnosis not present

## 2022-09-21 ENCOUNTER — Encounter: Payer: Self-pay | Admitting: Dermatology

## 2022-09-22 ENCOUNTER — Ambulatory Visit: Payer: Medicare Other | Admitting: Dermatology

## 2022-09-28 ENCOUNTER — Encounter (INDEPENDENT_AMBULATORY_CARE_PROVIDER_SITE_OTHER): Payer: Self-pay | Admitting: Nurse Practitioner

## 2022-09-28 ENCOUNTER — Ambulatory Visit (INDEPENDENT_AMBULATORY_CARE_PROVIDER_SITE_OTHER): Payer: Medicare Other

## 2022-09-28 ENCOUNTER — Ambulatory Visit (INDEPENDENT_AMBULATORY_CARE_PROVIDER_SITE_OTHER): Payer: Medicare Other | Admitting: Nurse Practitioner

## 2022-09-28 VITALS — BP 96/64 | HR 85 | Resp 17 | Ht 73.0 in | Wt 153.8 lb

## 2022-09-28 DIAGNOSIS — I70229 Atherosclerosis of native arteries of extremities with rest pain, unspecified extremity: Secondary | ICD-10-CM

## 2022-09-28 DIAGNOSIS — I10 Essential (primary) hypertension: Secondary | ICD-10-CM

## 2022-09-28 DIAGNOSIS — E1151 Type 2 diabetes mellitus with diabetic peripheral angiopathy without gangrene: Secondary | ICD-10-CM | POA: Diagnosis not present

## 2022-10-03 ENCOUNTER — Encounter (INDEPENDENT_AMBULATORY_CARE_PROVIDER_SITE_OTHER): Payer: Self-pay | Admitting: Nurse Practitioner

## 2022-10-03 NOTE — Progress Notes (Signed)
Subjective:    Patient ID: Jordan Arellano, male    DOB: 22-Oct-1951, 71 y.o.   MRN: [id number] No chief complaint on file.   The patient returns to the office for followup and review of the noninvasive studies.   The patient notes that there has been a significant deterioration in the lower extremity symptoms.  The patient notes interval shortening of their claudication distance and development of mild rest pain symptoms. No new ulcers or wounds have occurred since the last visit.  There have been no significant changes to the patient's overall health care.  The patient denies amaurosis fugax or recent TIA symptoms. There are no recent neurological changes noted. There is no history of DVT, PE or superficial thrombophlebitis. The patient denies recent episodes of angina or shortness of breath.   The patient has a right below-knee amputation.  Today his left ABI 0.50 his previous ABI in 2019 was 0.30.  He has monophasic tibial artery waveforms with dampened toe waveforms in the left.    Review of Systems  Musculoskeletal:  Positive for gait problem.  Skin:  Negative for wound.  All other systems reviewed and are negative.      Objective:   Physical Exam Vitals reviewed.  HENT:     Head: Normocephalic.  Cardiovascular:     Rate and Rhythm: Normal rate.  Pulmonary:     Effort: Pulmonary effort is normal.  Musculoskeletal:     Right Lower Extremity: Right leg is amputated above knee.  Skin:    General: Skin is warm and dry.  Neurological:     Mental Status: He is alert and oriented to person, place, and time.  Psychiatric:        Mood and Affect: Mood normal.        Behavior: Behavior normal.        Thought Content: Thought content normal.        Judgment: Judgment normal.     BP 96/64 (BP Location: Right Arm)   Pulse 85   Resp 17   Ht _0  (1.854 m)   Wt 153 lb 12.8 oz (69.8 kg)   BMI 20.29 kg/m   Past Medical History:  Diagnosis Date   Basal cell carcinoma  08/11/2022   R forearm - ED&C   Depression    Foot ulcer ([place]) 12/12/2016   Hyperlipidemia    Hypertension    PAD (peripheral artery disease) ([place]) ~2007   s/p R BKA for non-healing wound   Stroke ([place]) 03/2014   MRI: Acute nonhemorrhagic left paracentral pontine infarct. 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   TIA (transient ischemic attack) 01/2014    Social History   Socioeconomic History   Marital status: Widowed    Spouse name: Not on file   Number of children: 2   Years of education: Not on file   Highest education level: Not on file  Occupational History    Comment: Disabled  Tobacco Use   Smoking status: Some Days    Packs/day: 0.50    Years: 60.00    Total pack years: 30.00    Types: Cigarettes   Smokeless tobacco: Never  Vaping Use   Vaping Use: Never used  Substance and Sexual Activity   Alcohol use: No   Drug use: No   Sexual activity: Not Currently    Birth control/protection: Abstinence  Other Topics Concern   Not on file  Social  History Narrative   One boy youngest, killed in MVA    Social Determinants of Health   Financial Resource Strain: Low Risk  (10/08/2021)   Overall Financial Resource Strain (CARDIA)    Difficulty of Paying Living Expenses: Not hard at all  Food Insecurity: No Food Insecurity (04/07/2022)   Hunger Vital Sign    Worried About Running Out of Food in the Last Year: Never true    [place] in the Last Year: Never true  Transportation Needs: No Transportation Needs (04/07/2022)   PRAPARE - Hydrologist (Medical): No    Lack of Transportation (Non-Medical): No  Physical Activity: Insufficiently Active (04/07/2022)   Exercise Vital Sign    Days of Exercise per Week: 2 days    Minutes of Exercise per Session: 30 min  Stress: No Stress Concern Present (04/07/2022)   [place]    Feeling of Stress : Not at all  Social Connections: Socially Isolated (04/07/2022)   Social Connection and Isolation Panel [NHANES]    Frequency of Communication with Friends and Family: Three times a week    Frequency of Social Gatherings with Friends and Family: Three times a week    Attends Religious Services: Never    Active Member of Clubs or Organizations: No    Attends Archivist Meetings: Never    Marital Status: Widowed  Intimate Partner Violence: Not At Risk (04/07/2022)   Humiliation, Afraid, Rape, and Kick questionnaire    Fear of Current or Ex-Partner: No    Emotionally Abused: No    Physically Abused: No    Sexually Abused: No    Past Surgical History:  Procedure Laterality Date   BELOW KNEE LEG AMPUTATION Right    FEMORAL-POPLITEAL BYPASS GRAFT Left 12/17/2016   Procedure: BYPASS LEFT FEMORAL TO BELOW POPLITEAL ARTERY USING PROPATEN GORE GRAFT;  Surgeon: Rosetta Posner, MD;  Location: [facility];  Service: Vascular;  Laterality: Left;   FEMORAL-POPLITEAL BYPASS GRAFT Left 09/30/2017   Procedure: LEFT LEG ANGIOGRAM,  THROMBECTOMY, FEM-POPLITEAL BYPASS GRAFT, tHROMBECTOMY PERONEAL ARTERY AND POSTERIOR TIBIAL , ENDARTERECTOMY TIBIAL/PERONEAL TRUNK WITH BOVINE PATCH ANGIOPLASTY.;  Surgeon: Conrad Mather, MD;  Location: [facility];  Service: Vascular;  Laterality: Left;   PERIPHERAL VASCULAR CATHETERIZATION Left 12/14/2016   Procedure: Abdominal Aortogram w/Lower Extremity;  Surgeon: Angelia Mould, MD;  Location: [facility];  Service: Cardiovascular;  Laterality: Left;   right cataract extraction     TRANSTHORACIC ECHOCARDIOGRAM  01/2014   To evaluate possible CVA: EF 55-60%. GR 1 DD. No significant valvular lesions    Family History  Problem Relation Age of Onset   Hypertension Mother        Does not know history   Heart disease Mother    Stroke Mother    Diabetes Mother    Hypertension Father    Heart disease Father    Stroke Father     Diabetes Sister    Hypertension Sister    Heart disease Brother    Hypertension Brother     No Known Allergies     Latest Ref Rng & Units 07/21/2022   12:09 PM 06/27/2020   10:51 AM 11/21/2018   10:49 AM  CBC  WBC 4.0 - 10.5 K/uL 13.6  9.9  12.2   Hemoglobin 13.0 - 17.0 g/dL 12.8  13.1  13.5   Hematocrit 39.0 - 52.0 % 40.0  39.4  39.7   Platelets 150.0 - 400.0 K/uL 371.0  282.0  342.0       CMP     Component Value Date/Time   NA 137 07/21/2022 1209   NA 139 02/03/2016 0000   K 5.1 07/21/2022 1209   CL 104 07/21/2022 1209   CO2 24 07/21/2022 1209   GLUCOSE 94 07/21/2022 1209   BUN 29 (H) 07/21/2022 1209   BUN 28 (A) 02/03/2016 0000   CREATININE 1.29 07/21/2022 1209   CALCIUM 9.5 07/21/2022 1209   PROT 7.6 07/21/2022 1209   ALBUMIN 4.5 07/21/2022 1209   AST 13 07/21/2022 1209   ALT 8 07/21/2022 1209   ALKPHOS 33 (L) 07/21/2022 1209   BILITOT 0.4 07/21/2022 1209   GFRNONAA >60 10/02/2017 0943   GFRAA >60 10/02/2017 0943     No results found.     Assessment & Plan:   1. 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  This represents a limb threatening ischemia and places the patient at the risk for left lower extremity limb loss.  Patient should undergo angiography of the left lower extremity with the hope for intervention for limb salvage.  The risks and benefits as well as the alternative therapies was discussed in detail with the patient.  All questions were answered.  Patient agrees to proceed with left lower extremity angiography.  The patient will follow up with me in the office after the procedure.      - VAS [place] ABI WITH/WO TBI  2. DM (diabetes mellitus) with peripheral vascular complication  (HCC) Continue hypoglycemic medications as already ordered, these medications have been reviewed and there are no changes at this time.  Hgb A1C to be monitored as already arranged by primary service   3. Essential hypertension Continue antihypertensive medications as already ordered, these medications have been reviewed and there are no changes at this time.    Current Outpatient Medications on File Prior to Visit  Medication Sig Dispense Refill   acetaminophen (TYLENOL) 500 MG tablet Take 500 mg by mouth every 6 (six) hours as needed.     aspirin 81 MG tablet Take 81 mg by mouth daily.     blood glucose meter kit and supplies KIT Dispense based on patient and insurance preference. Use two times daily as directed (before breakfast and at bedtime. (FOR ICD-10: E11.51) 1 each 0   fenofibrate 54 MG tablet Take 1 tablet (54 mg total) by mouth daily. 90 tablet 3   metFORMIN (GLUCOPHAGE) 850 MG tablet TAKE 1 TABLET BY MOUTH TWICE  DAILY WITH MEALS 200 tablet 2   olmesartan (BENICAR) 40 MG tablet TAKE 1 TABLET BY MOUTH DAILY 100 tablet 3   rosuvastatin (CRESTOR) 40 MG tablet Take 1 tablet (40 mg total) by mouth daily. 90 tablet 3   sertraline (ZOLOFT) 100 MG tablet Take 1 tablet (100 mg total) by mouth daily. 90 tablet 3   spironolactone (ALDACTONE) 25 MG tablet Take 1 tablet (25 mg total) by mouth daily. 90 tablet 1   No current facility-administered medications on file prior to visit.    There are no Patient Instructions on file for this visit. No follow-ups on file.   Kris Hartmann, NP

## 2022-10-06 ENCOUNTER — Telehealth (INDEPENDENT_AMBULATORY_CARE_PROVIDER_SITE_OTHER): Payer: Self-pay

## 2022-10-06 NOTE — Telephone Encounter (Addendum)
I attempted to contact the sister for the patient to schedule a LLE angio with Dr. Delana Meyer and a message was left for a call back. Patient's sister called back and patient is scheduled with Dr. Delana Meyer on 11/02/22 with a 6:45 am arrival time to the [facility]. Pre-procedure instructions were discussed and will be mailed.

## 2022-10-13 DIAGNOSIS — H905 Unspecified sensorineural hearing loss: Secondary | ICD-10-CM | POA: Diagnosis not present

## 2022-11-02 ENCOUNTER — Telehealth (INDEPENDENT_AMBULATORY_CARE_PROVIDER_SITE_OTHER): Payer: Self-pay

## 2022-11-02 DIAGNOSIS — I70219 Atherosclerosis of native arteries of extremities with intermittent claudication, unspecified extremity: Secondary | ICD-10-CM

## 2022-11-02 MED ORDER — MIDAZOLAM HCL 2 MG/ML PO SYRP: 8.0000 mg | ORAL_SOLUTION | Freq: Once | ORAL | Status: DC | PRN

## 2022-11-02 MED ORDER — FAMOTIDINE 20 MG PO TABS: 40.0000 mg | ORAL_TABLET | Freq: Once | ORAL | Status: DC | PRN

## 2022-11-02 MED ORDER — METHYLPREDNISOLONE SODIUM SUCC 125 MG IJ SOLR: 125.0000 mg | Freq: Once | INTRAMUSCULAR | Status: DC | PRN

## 2022-11-02 MED ORDER — SODIUM CHLORIDE 0.9 % IV SOLN: INTRAVENOUS | Status: DC

## 2022-11-02 MED ORDER — CEFAZOLIN SODIUM-DEXTROSE 2-4 GM/100ML-% IV SOLN: 2.0000 g | INTRAVENOUS | Status: DC

## 2022-11-02 MED ORDER — DIPHENHYDRAMINE HCL 50 MG/ML IJ SOLN: 50.0000 mg | Freq: Once | INTRAMUSCULAR | Status: DC | PRN

## 2022-11-02 NOTE — Telephone Encounter (Signed)
I attempted to contact the patient to give him the information regarding his procedure and that it has been rescheduled to 11/09/22 with a 6:45 am [facility] with Dr. Delana Meyer. A message was left on his and his sister's voicemail box for a return call.

## 2022-11-03 NOTE — Telephone Encounter (Signed)
Patient's sister called back and stated she would like the patient's procedure to be moved to 12/14/22 with a 6:45 [facility]. Patient's sister will call back to clarify and complete this appt.

## 2022-12-08 ENCOUNTER — Telehealth (INDEPENDENT_AMBULATORY_CARE_PROVIDER_SITE_OTHER): Payer: Self-pay

## 2022-12-08 NOTE — Telephone Encounter (Signed)
Spoke with the patient's sister and the patient is scheduled with Dr. Delana Meyer on 12/14/22 with a 6:45 am [facility]. Pre-procedure instructions were discussed and will be mailed to the sister's home per her request.

## 2022-12-14 ENCOUNTER — Encounter
Admission: RE | Disposition: A | Discharge status: Home / Self Care | Payer: Self-pay | Source: Home / Self Care | Attending: Vascular Surgery

## 2022-12-14 ENCOUNTER — Other Ambulatory Visit: Payer: Self-pay

## 2022-12-14 ENCOUNTER — Ambulatory Visit
Admission: RE | Admit: 2022-12-14 | Discharge: 2022-12-14 | Disposition: A | Discharge status: Home / Self Care | Payer: Medicare Other | Attending: Vascular Surgery | Admitting: Vascular Surgery

## 2022-12-14 ENCOUNTER — Encounter: Payer: Self-pay | Admitting: Vascular Surgery

## 2022-12-14 DIAGNOSIS — I70219 Atherosclerosis of native arteries of extremities with intermittent claudication, unspecified extremity: Secondary | ICD-10-CM

## 2022-12-14 DIAGNOSIS — Z89511 Acquired absence of right leg below knee: Secondary | ICD-10-CM | POA: Diagnosis not present

## 2022-12-14 DIAGNOSIS — I10 Essential (primary) hypertension: Secondary | ICD-10-CM | POA: Insufficient documentation

## 2022-12-14 DIAGNOSIS — E1151 Type 2 diabetes mellitus with diabetic peripheral angiopathy without gangrene: Secondary | ICD-10-CM | POA: Diagnosis present

## 2022-12-14 DIAGNOSIS — F17210 Nicotine dependence, cigarettes, uncomplicated: Secondary | ICD-10-CM | POA: Insufficient documentation

## 2022-12-14 DIAGNOSIS — I70222 Atherosclerosis of native arteries of extremities with rest pain, left leg: Secondary | ICD-10-CM | POA: Diagnosis not present

## 2022-12-14 HISTORY — PX: LOWER EXTREMITY ANGIOGRAPHY: CATH118251

## 2022-12-14 LAB — BUN: BUN: 21 mg/dL (ref 8–23)

## 2022-12-14 LAB — GLUCOSE, CAPILLARY: Glucose-Capillary: 154 mg/dL — ABNORMAL HIGH (ref 70–99)

## 2022-12-14 LAB — CREATININE, SERUM
Creatinine, Ser: 1.17 mg/dL (ref 0.61–1.24)
GFR, Estimated: >60 mL/min (ref >60)

## 2022-12-14 SURGERY — LOWER EXTREMITY ANGIOGRAPHY
Anesthesia: Moderate Sedation | Site: Leg Lower | Laterality: Left

## 2022-12-14 MED ORDER — HEPARIN SODIUM (PORCINE) 1000 UNIT/ML IJ SOLN
INTRAMUSCULAR | Status: DC | PRN
  Administered 2022-12-14: 6000 [IU] via INTRAVENOUS

## 2022-12-14 MED ORDER — FENTANYL CITRATE (PF) 100 MCG/2ML IJ SOLN
INTRAMUSCULAR | Status: DC | PRN
  Administered 2022-12-14 (×2): 50 ug via INTRAVENOUS

## 2022-12-14 MED ORDER — IODIXANOL 320 MG/ML IV SOLN
INTRAVENOUS | Status: DC | PRN
  Administered 2022-12-14: 80 mL via INTRA_ARTERIAL

## 2022-12-14 MED ORDER — SODIUM CHLORIDE 0.9 % IV SOLN: INTRAVENOUS | Status: DC

## 2022-12-14 MED ORDER — MIDAZOLAM HCL 2 MG/ML PO SYRP: 8.0000 mg | ORAL_SOLUTION | Freq: Once | ORAL | Status: DC | PRN

## 2022-12-14 MED ORDER — CEFAZOLIN SODIUM-DEXTROSE 2-4 GM/100ML-% IV SOLN
INTRAVENOUS | Status: AC
  Administered 2022-12-14: 2 g via INTRAVENOUS
  Filled 2022-12-14: qty 100

## 2022-12-14 MED ORDER — ONDANSETRON HCL 4 MG/2ML IJ SOLN: 4.0000 mg | Freq: Four times a day (QID) | INTRAMUSCULAR | Status: DC | PRN

## 2022-12-14 MED ORDER — OXYCODONE HCL 5 MG PO TABS: 5.0000 mg | ORAL_TABLET | ORAL | Status: DC | PRN

## 2022-12-14 MED ORDER — SODIUM CHLORIDE 0.9% FLUSH: 3.0000 mL | INTRAVENOUS | Status: DC | PRN

## 2022-12-14 MED ORDER — MORPHINE SULFATE (PF) 4 MG/ML IV SOLN: 2.0000 mg | INTRAVENOUS | Status: DC | PRN

## 2022-12-14 MED ORDER — FENTANYL CITRATE PF 50 MCG/ML IJ SOSY
PREFILLED_SYRINGE | INTRAMUSCULAR | Status: AC
  Filled 2022-12-14: qty 1

## 2022-12-14 MED ORDER — HYDRALAZINE HCL 20 MG/ML IJ SOLN: 5.0000 mg | INTRAMUSCULAR | Status: DC | PRN

## 2022-12-14 MED ORDER — LABETALOL HCL 5 MG/ML IV SOLN: 10.0000 mg | INTRAVENOUS | Status: DC | PRN

## 2022-12-14 MED ORDER — HYDROMORPHONE HCL 1 MG/ML IJ SOLN: 1.0000 mg | Freq: Once | INTRAMUSCULAR | Status: DC | PRN

## 2022-12-14 MED ORDER — CEFAZOLIN SODIUM-DEXTROSE 2-4 GM/100ML-% IV SOLN: 2.0000 g | INTRAVENOUS | Status: AC

## 2022-12-14 MED ORDER — FAMOTIDINE 20 MG PO TABS: 40.0000 mg | ORAL_TABLET | Freq: Once | ORAL | Status: DC | PRN

## 2022-12-14 MED ORDER — CLOPIDOGREL BISULFATE 75 MG PO TABS: 75.0000 mg | ORAL_TABLET | Freq: Every day | ORAL | 5 refills | Status: AC

## 2022-12-14 MED ORDER — FENTANYL CITRATE PF 50 MCG/ML IJ SOSY
PREFILLED_SYRINGE | INTRAMUSCULAR | Status: AC
  Filled 2022-12-14: qty 2

## 2022-12-14 MED ORDER — SODIUM CHLORIDE 0.9% FLUSH: 3.0000 mL | Freq: Two times a day (BID) | INTRAVENOUS | Status: DC

## 2022-12-14 MED ORDER — METHYLPREDNISOLONE SODIUM SUCC 125 MG IJ SOLR: 125.0000 mg | Freq: Once | INTRAMUSCULAR | Status: DC | PRN

## 2022-12-14 MED ORDER — CLOPIDOGREL BISULFATE 75 MG PO TABS
ORAL_TABLET | ORAL | Status: AC
  Filled 2022-12-14: qty 4

## 2022-12-14 MED ORDER — ACETAMINOPHEN 325 MG PO TABS: 650.0000 mg | ORAL_TABLET | ORAL | Status: DC | PRN

## 2022-12-14 MED ORDER — MIDAZOLAM HCL 2 MG/2ML IJ SOLN
INTRAMUSCULAR | Status: AC
  Filled 2022-12-14: qty 4

## 2022-12-14 MED ORDER — CLOPIDOGREL BISULFATE 300 MG PO TABS
300.0000 mg | ORAL_TABLET | ORAL | Status: AC
  Administered 2022-12-14: 300 mg via ORAL

## 2022-12-14 MED ORDER — SODIUM CHLORIDE 0.9 % IV SOLN: 250.0000 mL | INTRAVENOUS | Status: DC | PRN

## 2022-12-14 MED ORDER — MIDAZOLAM HCL 2 MG/2ML IJ SOLN
INTRAMUSCULAR | Status: DC | PRN
  Administered 2022-12-14: 1 mg via INTRAVENOUS
  Administered 2022-12-14: 2 mg via INTRAVENOUS

## 2022-12-14 MED ORDER — DIPHENHYDRAMINE HCL 50 MG/ML IJ SOLN: 50.0000 mg | Freq: Once | INTRAMUSCULAR | Status: DC | PRN

## 2022-12-14 MED ORDER — HEPARIN SODIUM (PORCINE) 1000 UNIT/ML IJ SOLN
INTRAMUSCULAR | Status: AC
  Filled 2022-12-14: qty 10

## 2022-12-14 SURGICAL SUPPLY — 28 items
BALLN LUTONIX AV 9X40X75 (BALLOONS) ×1
BALLN LUTONIX AV 9X60X75 (BALLOONS) ×1
BALLOON LUTONIX AV 9X40X75 (BALLOONS) IMPLANT
BALLOON LUTONIX AV 9X60X75 (BALLOONS) IMPLANT
CATH ANGIO 5F PIGTAIL 65CM (CATHETERS) IMPLANT
CATH BEACON 5 .035 40 KMP TP (CATHETERS) IMPLANT
CATH BEACON 5 .038 40 KMP TP (CATHETERS) ×1
CATH TEMPO 5F RIM 65CM (CATHETERS) IMPLANT
COVER PROBE ULTRASOUND 5X96 (MISCELLANEOUS) IMPLANT
DEVICE STARCLOSE SE CLOSURE (Vascular Products) IMPLANT
GLIDEWIRE ADV .035X180CM (WIRE) IMPLANT
GLIDEWIRE ADV .035X260CM (WIRE) IMPLANT
GOWN STRL REUS W/ TWL LRG LVL3 (GOWN DISPOSABLE) ×1 IMPLANT
GOWN STRL REUS W/TWL LRG LVL3 (GOWN DISPOSABLE) ×1
INTRODUCER 7FR 23CM (INTRODUCER) IMPLANT
KIT ENCORE 26 ADVANTAGE (KITS) IMPLANT
KIT MICROPUNCTURE NIT STIFF (SHEATH) IMPLANT
NDL ENTRY 21GA 7CM ECHOTIP (NEEDLE) IMPLANT
NEEDLE ENTRY 21GA 7CM ECHOTIP (NEEDLE) ×1 IMPLANT
PACK ANGIOGRAPHY (CUSTOM PROCEDURE TRAY) ×1 IMPLANT
SET INTRO CAPELLA COAXIAL (SET/KITS/TRAYS/PACK) IMPLANT
SHEATH BRITE TIP 5FRX11 (SHEATH) IMPLANT
SHIELD X-DRAPE GOLD 12X17 (MISCELLANEOUS) IMPLANT
STENT LIFESTREAM 8X37X80 (Permanent Stent) IMPLANT
STENT LIFESTREAM 8X58X80 (Permanent Stent) IMPLANT
SYR MEDRAD MARK 7 150ML (SYRINGE) IMPLANT
TUBING CONTRAST HIGH PRESS 72 (TUBING) IMPLANT
WIRE GUIDERIGHT .035X150 (WIRE) IMPLANT

## 2022-12-14 NOTE — Progress Notes (Signed)
MD at bedside, speaking with pt. And his sister Lelon Frohlich re: procedural results. Both verbalize understanding of conversation with MD.

## 2022-12-14 NOTE — Op Note (Signed)
[facility]  Percutaneous Study/Intervention Procedural Note   Date of Surgery: 12/14/2022  Surgeon:Audryna Wendt, Dolores Lory   Pre-operative Diagnosis: Atherosclerotic occlusive disease bilateral lower extremities with rest pain of the left lower extremity  Post-operative diagnosis:  Same  Procedure(s) Performed:  1.  Abdominal aortogram  2.  Bilateral distal runoff  3.  Percutaneous transluminal angioplasty and stent placement right common iliac artery; "kissing balloon" technique  4.  Percutaneous transluminal and plasty and stent placement left common iliac artery; "kissing balloon" technique  5.  Ultrasound guided access bilateral common femoral arteries  6.  StarClose closure device bilateral common femoral arteries  Anesthesia: Conscious sedation was administered under my direct supervision by the interventional radiology RN. IV Versed plus fentanyl were utilized. Continuous ECG, pulse oximetry and blood pressure was monitored throughout the entire procedure. Conscious sedation was for a total of 52 minutes.  Sheath: Bilateral 23 cm 7 French Pinnacle sheaths retrograde common femoral arteries  Contrast: 80 cc  Fluoroscopy Time: 9.7 minutes  Indications: Patient presents with increasing pain of both lower extremities.  Physical examination as well as noninvasive studies demonstrate significant atherosclerotic occlusive disease.  Angiography with hope for intervention has been recommended.  Risks and benefits have been reviewed all questions have been answered alternative therapies have also been discussed patient has agreed to proceed.  Procedure:  Jordan Wilsonis a 72 y.o. male who was identified and appropriate procedural time out was performed.  The patient was then placed supine on the table and prepped and draped in the usual sterile fashion.  Ultrasound was used to evaluate the right common femoral artery.  It was echolucent and pulsatile indicating it is  patent .  An ultrasound image was acquired for the permanent record.  A micropuncture needle was used to access the right common femoral artery under direct ultrasound guidance.  The microwire was then advanced under fluoroscopic guidance without difficulty followed by the micro-sheath  A 0.035 J wire was advanced without resistance and a 5Fr sheath was placed.    The pigtail catheter was then positioned at the level of T12 and an AP image of the aorta was obtained. After review the images the pigtail catheter was repositioned above the aortic bifurcation and bilateral oblique views of the pelvis were obtained. Subsequently the detector was returned to the AP position and a rim catheter with a advantage wire was used to cross the aortic bifurcation and the catheter was advanced down to the profunda femoris.  Left lower extremity runoff was obtained.  After review the images the ultrasound was reprepped and delivered back onto the sterile field. The left common femoral was then imaged with the ultrasound it was noted to be echolucent and pulsatile indicating patency. Images recorded for the permanent record. Under real-time visualization a microneedle was inserted into the anterior wall the common femoral artery microwire was then advanced without difficulty under fluoroscopic guidance followed by placement of the micro-sheath.  A advantage wire was then negotiated under fluoroscopic guidance into the aorta.  7 French sheath was then placed.  6000 units of heparin was given and allowed to circulate for proximally 4 minutes.  The right 5 French sheath was then upsized to a 7 [place] sheath as well after a advantage wire was advanced through the pigtail catheter. Magnified images of the aortic bifurcation were then made using hand injection contrast from the femoral sheaths. After appropriate sizing a 8 mm x 37 mm Lifestream stent was selected for the  right and a 8 mm x 58 mm Lifestream stent was selected for  the left. There were then advanced and positioned just above the aortic bifurcation. Insufflation for full expansion of the stents was performed simultaneously. Follow-up imaging was then performed and the left stent in particular seemed somewhat undersized and therefore 9 mm Lutonix drug-eluting balloons were advanced up both sides and inflated simultaneously to 8 atm and held for 30 to 60 seconds.  The pigtail catheter was then introduced up the right and bolus injection of contrast was used to perform final imaging of the distal aortic reconstruction.  Oblique views were then obtained of the groins in succession and Star close device is deployed without difficulty. There were no immediate complications   Findings:   Aortogram:  The abdominal aorta is opacified with a bolus injection contrast. Demonstrates diffuse disease but there are no hemodynamically significant lesions noted until the distal aortic bifurcation where bilateral greater than 80% ostial iliac lesion is noted on the left and approximately 60% stenosis is noted on the right. There is moderate poststenotic dilatation noted of the common iliac arteries as well.  Right Lower Extremity: The right lower extremity is predominantly fed by the right  internal iliac artery which has numerous collaterals reconstituting the profunda femoris.  There is severe disease of the right external iliac artery the right common femoral artery is occluded from its midportion distally.  Following stent placement in the right common there is now less than 10% residual stenosis and improved flow into the internal iliac artery  Left Lower Extremity: The left common femoral and profundafemoral demonstrate mild disease there are no hemodynamically significant stenoses.  There are numerous large collaterals from the distal profunda femoris that reconstitute the peroneal at the level of the mid calf.  There appears to be single-vessel runoff via this peroneal as there  is nonvisualization of the trifurcation anterior tibial or posterior tibial arteries.  There are large collaterals in the distal peroneal that fills the pedal arch.  Following angioplasty and stent placement in the left common iliac artery there is now less than 10% residual stenosis  Following placement of the iliac stents there is now wide patency with less than 10% residual stenosis with rapid flow through the aortic bifurcation bilaterally.  Summary:  Successful reconstruction of the distal aorta and bilateral iliac arteries  Disposition: Patient was taken to the recovery room in stable condition having tolerated the procedure well.  Jordan Arellano 12/14/2022,9:26 AM

## 2022-12-14 NOTE — H&P (View-Only) (Signed)
MRN : [id number]  Jordan Arellano is a 72 y.o. (Feb 21, 1951) male who presents with chief complaint of check circulation.  History of Present Illness:   The patient presents to [facility] today for evaluation of his left lower extremity arterial system.  The patient notes that there has been a significant deterioration in the lower extremity symptoms.  The patient notes interval shortening of their claudication distance and development of mild rest pain symptoms. No new ulcers or wounds have occurred since the last visit.   There have been no significant changes to the patient's overall health care.   The patient denies amaurosis fugax or recent TIA symptoms. There are no recent neurological changes noted. There is no history of DVT, PE or superficial thrombophlebitis. The patient denies recent episodes of angina or shortness of breath.    The patient has a right below-knee amputation.  Today his left ABI 0.50 his previous ABI in 2019 was 0.30.  He has monophasic tibial artery waveforms with dampened toe waveforms in the left.  Current Meds  Medication Sig   acetaminophen (TYLENOL) 500 MG tablet Take 500 mg by mouth every 6 (six) hours as needed.   aspirin 81 MG tablet Take 81 mg by mouth daily.   olmesartan (BENICAR) 40 MG tablet TAKE 1 TABLET BY MOUTH DAILY   spironolactone (ALDACTONE) 25 MG tablet Take 1 tablet (25 mg total) by mouth daily.    Past Medical History:  Diagnosis Date   Basal cell carcinoma 08/11/2022   R forearm - ED&C   Depression    Foot ulcer ([place]) 12/12/2016   Hyperlipidemia    Hypertension    PAD (peripheral artery disease) ([place]) ~2007   s/p R BKA for non-healing wound   Stroke ([place]) 03/2014   MRI: Acute nonhemorrhagic left paracentral pontine infarct. 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   TIA (transient ischemic attack) 01/2014    Past  Surgical History:  Procedure Laterality Date   BELOW KNEE LEG AMPUTATION Right    FEMORAL-POPLITEAL BYPASS GRAFT Left 12/17/2016   Procedure: BYPASS LEFT FEMORAL TO BELOW POPLITEAL ARTERY USING PROPATEN GORE GRAFT;  Surgeon: Rosetta Posner, MD;  Location: [place];  Service: Vascular;  Laterality: Left;   FEMORAL-POPLITEAL BYPASS GRAFT Left 09/30/2017   Procedure: LEFT LEG ANGIOGRAM,  THROMBECTOMY, FEM-POPLITEAL BYPASS GRAFT, tHROMBECTOMY PERONEAL ARTERY AND POSTERIOR TIBIAL , ENDARTERECTOMY TIBIAL/PERONEAL TRUNK WITH BOVINE PATCH ANGIOPLASTY.;  Surgeon: Conrad Neillsville, MD;  Location: [place];  Service: Vascular;  Laterality: Left;   PERIPHERAL VASCULAR CATHETERIZATION Left 12/14/2016   Procedure: Abdominal Aortogram w/Lower Extremity;  Surgeon: Angelia Mould, MD;  Location: [facility];  Service: Cardiovascular;  Laterality: Left;   right cataract extraction     TRANSTHORACIC ECHOCARDIOGRAM  01/2014   To evaluate possible CVA: EF 55-60%. GR 1 DD. No significant valvular lesions    Social History Social History   Tobacco Use   Smoking status: Some Days    Packs/day: 0.50    Years: 60.00    Total pack years: 30.00    Types: Cigarettes   Smokeless tobacco: Never  Vaping Use   Vaping Use: Never used  Substance Use Topics   Alcohol use: No   Drug use: No    Family History Family History  Problem Relation Age of Onset   Hypertension Mother  Does not know history   Heart disease Mother    Stroke Mother    Diabetes Mother    Hypertension Father    Heart disease Father    Stroke Father    Diabetes Sister    Hypertension Sister    Heart disease Brother    Hypertension Brother     No Known Allergies   REVIEW OF SYSTEMS (Negative unless checked)  Constitutional: '[]'$ Weight loss  '[]'$ Fever  '[]'$ Chills Cardiac: '[]'$ Chest pain   '[]'$ Chest pressure   '[]'$ Palpitations   '[]'$ Shortness of breath when laying flat   '[]'$ Shortness of breath with exertion. Vascular:  '[x]'$ Pain in legs  with walking   '[]'$ Pain in legs at rest  '[]'$ History of DVT   '[]'$ Phlebitis   '[]'$ Swelling in legs   '[]'$ Varicose veins   '[]'$ Non-healing ulcers Pulmonary:   '[]'$ Uses home oxygen   '[]'$ Productive cough   '[]'$ Hemoptysis   '[]'$ Wheeze  '[]'$ COPD   '[]'$ Asthma Neurologic:  '[]'$ Dizziness   '[]'$ Seizures   '[]'$ History of stroke   '[]'$ History of TIA  '[]'$ Aphasia   '[]'$ Vissual changes   '[]'$ Weakness or numbness in arm   '[]'$ Weakness or numbness in leg Musculoskeletal:   '[]'$ Joint swelling   '[]'$ Joint pain   '[]'$ Low back pain Hematologic:  '[]'$ Easy bruising  '[]'$ Easy bleeding   '[]'$ Hypercoagulable state   '[]'$ Anemic Gastrointestinal:  '[]'$ Diarrhea   '[]'$ Vomiting  '[]'$ Gastroesophageal reflux/heartburn   '[]'$ Difficulty swallowing. Genitourinary:  '[]'$ Chronic kidney disease   '[]'$ Difficult urination  '[]'$ Frequent urination   '[]'$ Blood in urine Skin:  '[]'$ Rashes   '[]'$ Ulcers  Psychological:  '[]'$ History of anxiety   '[]'$  History of major depression.  Physical Examination  There were no vitals filed for this visit. There is no height or weight on file to calculate BMI. Gen: WD/WN, NAD Head: [place]/AT, No temporalis wasting.  Ear/Nose/Throat: Hearing grossly intact, nares w/o erythema or drainage Eyes: PER, EOMI, sclera nonicteric.  Neck: Supple, no masses.  No bruit or JVD.  Pulmonary:  Good air movement, no audible wheezing, no use of accessory muscles.  Cardiac: RRR, normal S1, S2, no Murmurs. Vascular:  mild trophic changes, no open wounds Vessel Right Left  Radial Palpable Palpable  PT AKA Not Palpable  DP AKA Not Palpable  Gastrointestinal: soft, non-distended. No guarding/no peritoneal signs.  Musculoskeletal: M/S 5/5 throughout.  No visible deformity.  Neurologic: CN 2-12 intact. Pain and light touch intact in extremities.  Symmetrical.  Speech is fluent. Motor exam as listed above. Psychiatric: Judgment intact, Mood & affect appropriate for pt's clinical situation. Dermatologic: No rashes or ulcers noted.  No changes consistent with cellulitis.   CBC Lab Results   Component Value Date   WBC 13.6 (H) 07/21/2022   HGB 12.8 (L) 07/21/2022   HCT 40.0 07/21/2022   MCV 96.0 07/21/2022   PLT 371.0 07/21/2022    BMET    Component Value Date/Time   NA 137 07/21/2022 1209   NA 139 02/03/2016 0000   K 5.1 07/21/2022 1209   CL 104 07/21/2022 1209   CO2 24 07/21/2022 1209   GLUCOSE 94 07/21/2022 1209   BUN 29 (H) 07/21/2022 1209   BUN 28 (A) 02/03/2016 0000   CREATININE 1.29 07/21/2022 1209   CALCIUM 9.5 07/21/2022 1209   GFRNONAA >60 10/02/2017 0943   GFRAA >60 10/02/2017 0943   CrCl cannot be calculated (Patient's most recent lab result is older than the maximum 21 days allowed.).  COAG Lab Results  Component Value Date   INR 1.00 09/30/2017   INR 1.08 12/14/2016   INR  1.01 03/25/2014    Radiology No results found.   Assessment/Plan 1. 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  This represents a limb threatening ischemia and places the patient at the risk for left lower extremity limb loss.   Patient should undergo angiography of the left lower extremity with the hope for intervention for limb salvage.  The risks and benefits as well as the alternative therapies was discussed in detail with the patient.  All questions were answered.  Patient agrees to proceed with left lower extremity angiography.   The patient will follow up with me in the office after the procedure.      2. DM (diabetes mellitus) with peripheral vascular complication (HCC) Continue hypoglycemic medications as already ordered, these medications have been reviewed and there are no changes at this time.   Hgb A1C to be monitored as already arranged by primary service    3. Essential hypertension Continue  antihypertensive medications as already ordered, these medications have been reviewed and there are no changes at this time.        Hortencia Pilar, MD  12/14/2022 7:57 AM

## 2022-12-14 NOTE — Progress Notes (Signed)
MRN : [id number]  Jordan Arellano is a 72 y.o. ([birth year]) male who presents with chief complaint of check circulation.  History of Present Illness:   The patient presents to [facility] today for evaluation of his left lower extremity arterial system.  The patient notes that there has been a significant deterioration in the lower extremity symptoms.  The patient notes interval shortening of their claudication distance and development of mild rest pain symptoms. No new ulcers or wounds have occurred since the last visit.   There have been no significant changes to the patient's overall health care.   The patient denies amaurosis fugax or recent TIA symptoms. There are no recent neurological changes noted. There is no history of DVT, PE or superficial thrombophlebitis. The patient denies recent episodes of angina or shortness of breath.    The patient has a right below-knee amputation.  Today his left ABI 0.50 his previous ABI in 2019 was 0.30.  He has monophasic tibial artery waveforms with dampened toe waveforms in the left.  Current Meds  Medication Sig   acetaminophen (TYLENOL) 500 MG tablet Take 500 mg by mouth every 6 (six) hours as needed.   aspirin 81 MG tablet Take 81 mg by mouth daily.   olmesartan (BENICAR) 40 MG tablet TAKE 1 TABLET BY MOUTH DAILY   spironolactone (ALDACTONE) 25 MG tablet Take 1 tablet (25 mg total) by mouth daily.    Past Medical History:  Diagnosis Date   Basal cell carcinoma 08/11/2022   R forearm - ED&C   Depression    Foot ulcer ([place]) 12/12/2016   Hyperlipidemia    Hypertension    PAD (peripheral artery disease) ([place]) ~2007   s/p R BKA for non-healing wound   Stroke ([place]) 03/2014   MRI: Acute nonhemorrhagic left paracentral pontine infarct. 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   TIA (transient ischemic attack) 01/2014    Past  Surgical History:  Procedure Laterality Date   BELOW KNEE LEG AMPUTATION Right    FEMORAL-POPLITEAL BYPASS GRAFT Left 12/17/2016   Procedure: BYPASS LEFT FEMORAL TO BELOW POPLITEAL ARTERY USING PROPATEN GORE GRAFT;  Surgeon: Rosetta Posner, MD;  Location: [facility];  Service: Vascular;  Laterality: Left;   FEMORAL-POPLITEAL BYPASS GRAFT Left 09/30/2017   Procedure: LEFT LEG ANGIOGRAM,  THROMBECTOMY, FEM-POPLITEAL BYPASS GRAFT, tHROMBECTOMY PERONEAL ARTERY AND POSTERIOR TIBIAL , ENDARTERECTOMY TIBIAL/PERONEAL TRUNK WITH BOVINE PATCH ANGIOPLASTY.;  Surgeon: Conrad [place], MD;  Location: [facility];  Service: Vascular;  Laterality: Left;   PERIPHERAL VASCULAR CATHETERIZATION Left 12/14/2016   Procedure: Abdominal Aortogram w/Lower Extremity;  Surgeon: Angelia Mould, MD;  Location: [facility];  Service: Cardiovascular;  Laterality: Left;   right cataract extraction     TRANSTHORACIC ECHOCARDIOGRAM  01/2014   To evaluate possible CVA: EF 55-60%. GR 1 DD. No significant valvular lesions    Social History Social History   Tobacco Use   Smoking status: Some Days    Packs/day: 0.50    Years: 60.00    Total pack years: 30.00    Types: Cigarettes   Smokeless tobacco: Never  Vaping Use   Vaping Use: Never used  Substance Use Topics   Alcohol use: No   Drug use: No    Family History Family History  Problem Relation Age of Onset   Hypertension Mother  Does not know history   Heart disease Mother    Stroke Mother    Diabetes Mother    Hypertension Father    Heart disease Father    Stroke Father    Diabetes Sister    Hypertension Sister    Heart disease Brother    Hypertension Brother     No Known Allergies   REVIEW OF SYSTEMS (Negative unless checked)  Constitutional: '[]'$ Weight loss  '[]'$ Fever  '[]'$ Chills Cardiac: '[]'$ Chest pain   '[]'$ Chest pressure   '[]'$ Palpitations   '[]'$ Shortness of breath when laying flat   '[]'$ Shortness of breath with exertion. Vascular:  '[x]'$ Pain in legs  with walking   '[]'$ Pain in legs at rest  '[]'$ History of DVT   '[]'$ Phlebitis   '[]'$ Swelling in legs   '[]'$ Varicose veins   '[]'$ Non-healing ulcers Pulmonary:   '[]'$ Uses home oxygen   '[]'$ Productive cough   '[]'$ Hemoptysis   '[]'$ Wheeze  '[]'$ COPD   '[]'$ Asthma Neurologic:  '[]'$ Dizziness   '[]'$ Seizures   '[]'$ History of stroke   '[]'$ History of TIA  '[]'$ Aphasia   '[]'$ Vissual changes   '[]'$ Weakness or numbness in arm   '[]'$ Weakness or numbness in leg Musculoskeletal:   '[]'$ Joint swelling   '[]'$ Joint pain   '[]'$ Low back pain Hematologic:  '[]'$ Easy bruising  '[]'$ Easy bleeding   '[]'$ Hypercoagulable state   '[]'$ Anemic Gastrointestinal:  '[]'$ Diarrhea   '[]'$ Vomiting  '[]'$ Gastroesophageal reflux/heartburn   '[]'$ Difficulty swallowing. Genitourinary:  '[]'$ Chronic kidney disease   '[]'$ Difficult urination  '[]'$ Frequent urination   '[]'$ Blood in urine Skin:  '[]'$ Rashes   '[]'$ Ulcers  Psychological:  '[]'$ History of anxiety   '[]'$  History of major depression.  Physical Examination  There were no vitals filed for this visit. There is no height or weight on file to calculate BMI. Gen: WD/WN, NAD Head: [place]/AT, No temporalis wasting.  Ear/Nose/Throat: Hearing grossly intact, nares w/o erythema or drainage Eyes: PER, EOMI, sclera nonicteric.  Neck: Supple, no masses.  No bruit or JVD.  Pulmonary:  Good air movement, no audible wheezing, no use of accessory muscles.  Cardiac: RRR, normal S1, S2, no Murmurs. Vascular:  mild trophic changes, no open wounds Vessel Right Left  Radial Palpable Palpable  PT AKA Not Palpable  DP AKA Not Palpable  Gastrointestinal: soft, non-distended. No guarding/no peritoneal signs.  Musculoskeletal: M/S 5/5 throughout.  No visible deformity.  Neurologic: CN 2-12 intact. Pain and light touch intact in extremities.  Symmetrical.  Speech is fluent. Motor exam as listed above. Psychiatric: Judgment intact, Mood & affect appropriate for pt's clinical situation. Dermatologic: No rashes or ulcers noted.  No changes consistent with cellulitis.   CBC Lab Results   Component Value Date   WBC 13.6 (H) 07/21/2022   HGB 12.8 (L) 07/21/2022   HCT 40.0 07/21/2022   MCV 96.0 07/21/2022   PLT 371.0 07/21/2022    BMET    Component Value Date/Time   NA 137 07/21/2022 1209   NA 139 02/03/2016 0000   K 5.1 07/21/2022 1209   CL 104 07/21/2022 1209   CO2 24 07/21/2022 1209   GLUCOSE 94 07/21/2022 1209   BUN 29 (H) 07/21/2022 1209   BUN 28 (A) 02/03/2016 0000   CREATININE 1.29 07/21/2022 1209   CALCIUM 9.5 07/21/2022 1209   GFRNONAA >60 10/02/2017 0943   GFRAA >60 10/02/2017 0943   CrCl cannot be calculated (Patient's most recent lab result is older than the maximum 21 days allowed.).  COAG Lab Results  Component Value Date   INR 1.00 09/30/2017   INR 1.08 12/14/2016   INR  1.01 03/25/2014    Radiology No results found.   Assessment/Plan 1. 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  This represents a limb threatening ischemia and places the patient at the risk for left lower extremity limb loss.   Patient should undergo angiography of the left lower extremity with the hope for intervention for limb salvage.  The risks and benefits as well as the alternative therapies was discussed in detail with the patient.  All questions were answered.  Patient agrees to proceed with left lower extremity angiography.   The patient will follow up with me in the office after the procedure.      2. DM (diabetes mellitus) with peripheral vascular complication (HCC) Continue hypoglycemic medications as already ordered, these medications have been reviewed and there are no changes at this time.   Hgb A1C to be monitored as already arranged by primary service    3. Essential hypertension Continue  antihypertensive medications as already ordered, these medications have been reviewed and there are no changes at this time.        Hortencia Pilar, MD  12/14/2022 7:57 AM

## 2022-12-14 NOTE — Interval H&P Note (Signed)
History and Physical Interval Note:  12/14/2022 8:02 AM  Jordan Arellano  has presented today for surgery, with the diagnosis of LLE Angio  BARD   ASO w claudication.  The various methods of treatment have been discussed with the patient and family. After consideration of risks, benefits and other options for treatment, the patient has consented to  Procedure(s): Lower Extremity Angiography (Left) as a surgical intervention.  The patient's history has been reviewed, patient examined, no change in status, stable for surgery.  I have reviewed the patient's chart and labs.  Questions were answered to the patient's satisfaction.     Hortencia Pilar

## 2022-12-15 ENCOUNTER — Telehealth (INDEPENDENT_AMBULATORY_CARE_PROVIDER_SITE_OTHER): Payer: Self-pay

## 2022-12-15 NOTE — Telephone Encounter (Signed)
Patient sister called concerned that the patient is a smoker and the discharge information advised that he should not smoke.  She would like to know how long he should go without smoking or if/when he can start smoking again.

## 2022-12-16 NOTE — Telephone Encounter (Signed)
He should not smoke at all ever again.  Continued smoking damages his arteries and can lessen the longevity of the intervention that had done.

## 2022-12-16 NOTE — Telephone Encounter (Signed)
Left message informing patient sister of provider advise.

## 2023-01-04 ENCOUNTER — Other Ambulatory Visit (INDEPENDENT_AMBULATORY_CARE_PROVIDER_SITE_OTHER): Payer: Self-pay | Admitting: Vascular Surgery

## 2023-01-04 DIAGNOSIS — Z9889 Other specified postprocedural states: Secondary | ICD-10-CM

## 2023-01-06 ENCOUNTER — Encounter (INDEPENDENT_AMBULATORY_CARE_PROVIDER_SITE_OTHER): Payer: Medicare Other

## 2023-01-06 ENCOUNTER — Ambulatory Visit (INDEPENDENT_AMBULATORY_CARE_PROVIDER_SITE_OTHER): Payer: 59 | Admitting: Vascular Surgery

## 2023-01-12 ENCOUNTER — Ambulatory Visit (INDEPENDENT_AMBULATORY_CARE_PROVIDER_SITE_OTHER): Payer: 59 | Admitting: Nurse Practitioner

## 2023-01-12 ENCOUNTER — Encounter (INDEPENDENT_AMBULATORY_CARE_PROVIDER_SITE_OTHER): Payer: Self-pay | Admitting: Nurse Practitioner

## 2023-01-12 ENCOUNTER — Telehealth (INDEPENDENT_AMBULATORY_CARE_PROVIDER_SITE_OTHER): Payer: Self-pay | Admitting: Vascular Surgery

## 2023-01-12 ENCOUNTER — Ambulatory Visit (INDEPENDENT_AMBULATORY_CARE_PROVIDER_SITE_OTHER): Payer: 59

## 2023-01-12 VITALS — BP 115/73 | HR 71 | Resp 18 | Ht 73.0 in | Wt 165.0 lb

## 2023-01-12 DIAGNOSIS — Z9889 Other specified postprocedural states: Secondary | ICD-10-CM

## 2023-01-12 DIAGNOSIS — I739 Peripheral vascular disease, unspecified: Secondary | ICD-10-CM | POA: Diagnosis not present

## 2023-01-12 DIAGNOSIS — I10 Essential (primary) hypertension: Secondary | ICD-10-CM | POA: Diagnosis not present

## 2023-01-12 DIAGNOSIS — E1151 Type 2 diabetes mellitus with diabetic peripheral angiopathy without gangrene: Secondary | ICD-10-CM | POA: Diagnosis not present

## 2023-01-12 NOTE — Telephone Encounter (Signed)
Patient sister here at visit with patient upset and mad about bill they received. Patient sister stated when they got the bill it was in collections already and know they paid copay when it was sue. Let patients sister know that I could not take a copay for [place] and if and when we send billing to insurance for [place] it's out of my hands as far when they receive billing in the mail.   Patients daughter not happy with what was told to her and wants to speak with office manger. Let patient know manger was out and I would let her know when she returned.   She understood and is ok with that

## 2023-01-14 LAB — VAS US ABI WITH/WO TBI: Left ABI: 0.48

## 2023-01-17 ENCOUNTER — Telehealth: Payer: Self-pay | Admitting: Family

## 2023-01-17 NOTE — Telephone Encounter (Signed)
Requires visit to validate the podiatry referral. Need documentation. Will write note for post office at visit as well. Pt is also overdue for f/u , was due to December.

## 2023-01-17 NOTE — Telephone Encounter (Signed)
Scheduled patient office visit 2/14 @ 8:20

## 2023-01-17 NOTE — Telephone Encounter (Signed)
Patient is needing a letter for the post office in order to get his mailbox placed on his porch,stating that he is an amputee ,handicap,and not able to walk far distances.   Patient is also needing a referral for a podiatrist in [place] ,due to patient cutting his toe nails hisself and he broke the skin. But from now on he would need his toe nails cut down by a podiatrist.  Patient sister would like a phone call once letter is completed for her to pick up to take to post office. Otho Perl [phone]

## 2023-01-19 ENCOUNTER — Ambulatory Visit (INDEPENDENT_AMBULATORY_CARE_PROVIDER_SITE_OTHER): Payer: 59 | Admitting: Family

## 2023-01-19 ENCOUNTER — Encounter: Payer: Self-pay | Admitting: Family

## 2023-01-19 VITALS — BP 130/69 | HR 78 | Temp 97.6°F | Ht 73.0 in | Wt 158.6 lb

## 2023-01-19 DIAGNOSIS — E1151 Type 2 diabetes mellitus with diabetic peripheral angiopathy without gangrene: Secondary | ICD-10-CM

## 2023-01-19 DIAGNOSIS — Z9189 Other specified personal risk factors, not elsewhere classified: Secondary | ICD-10-CM

## 2023-01-19 DIAGNOSIS — H9193 Unspecified hearing loss, bilateral: Secondary | ICD-10-CM

## 2023-01-19 DIAGNOSIS — E781 Pure hyperglyceridemia: Secondary | ICD-10-CM | POA: Diagnosis not present

## 2023-01-19 DIAGNOSIS — I739 Peripheral vascular disease, unspecified: Secondary | ICD-10-CM

## 2023-01-19 DIAGNOSIS — I10 Essential (primary) hypertension: Secondary | ICD-10-CM

## 2023-01-19 DIAGNOSIS — H9011 Conductive hearing loss, unilateral, right ear, with unrestricted hearing on the contralateral side: Secondary | ICD-10-CM

## 2023-01-19 NOTE — Assessment & Plan Note (Signed)
>>  ASSESSMENT AND PLAN FOR HYPERTRIGLYCERIDEMIA WRITTEN ON 01/19/2023  9:39 AM BY DUGAL, TABITHA, FNP  Ordered lipid panel, pending results. Work on low cholesterol diet and exercise as tolerated

## 2023-01-19 NOTE — Assessment & Plan Note (Signed)
Stable. 

## 2023-01-19 NOTE — Assessment & Plan Note (Signed)
Pt to f/u with vascular doctor as scheduled.

## 2023-01-19 NOTE — Assessment & Plan Note (Signed)
Referral to podiatry to cut toenails moving forward.

## 2023-01-19 NOTE — Assessment & Plan Note (Signed)
Improved with hearing aids now

## 2023-01-19 NOTE — Progress Notes (Signed)
Established Patient Office Visit  Subjective:      CC:  Chief Complaint  Patient presents with   Referral    Podiatrist in [place]. Also needs a work accommodation note.    HPI: Jordan Arellano is a 72 y.o. male presenting on 01/19/2023 for Referral (Podiatrist in [place]. Also needs a work accommodation note.) . Was seen last with vascular surgeon in 10/23 they have since cancelled two following appointments. Recommendation was for angiography of left lower extremity with hope  for limb salvage.   Pt with BKA right side.  This limits his ability to walk long distances, also with severe atherosclerotics changes with rest pain that is associated with pre ulcerative changes and impending tissue loss of left foot. This increases his pain with walking as well.   Wt Readings from Last 3 Encounters:  01/19/23 158 lb 9.6 oz (71.9 kg)  01/12/23 165 lb (74.8 kg)  12/14/22 155 lb (70.3 kg)    New complaints: Vascular recently saw the patient and noticed there were places on his toes where he cut his toenails, and was advised to see a podiatrist to have them cut his nails. Due to diabetes risk of infection is very high.      Social history:  Relevant past medical, surgical, family and social history reviewed and updated as indicated. Interim medical history since our last visit reviewed.  Allergies and medications reviewed and updated.  DATA REVIEWED: CHART IN EPIC     ROS: Negative unless specifically indicated above in HPI.    Current Outpatient Medications:    acetaminophen (TYLENOL) 500 MG tablet, Take 500 mg by mouth every 6 (six) hours as needed., Disp: , Rfl:    aspirin 81 MG tablet, Take 81 mg by mouth daily., Disp: , Rfl:    blood glucose meter kit and supplies KIT, Dispense based on patient and insurance preference. Use two times daily as directed (before breakfast and at bedtime. (FOR ICD-10: E11.51), Disp: 1 each, Rfl: 0   clopidogrel (PLAVIX) 75 MG tablet,  Take 1 tablet (75 mg total) by mouth daily., Disp: 30 tablet, Rfl: 5   fenofibrate 54 MG tablet, Take 1 tablet (54 mg total) by mouth daily., Disp: 90 tablet, Rfl: 3   metFORMIN (GLUCOPHAGE) 850 MG tablet, TAKE 1 TABLET BY MOUTH TWICE  DAILY WITH MEALS, Disp: 200 tablet, Rfl: 2   olmesartan (BENICAR) 40 MG tablet, TAKE 1 TABLET BY MOUTH DAILY, Disp: 100 tablet, Rfl: 3   rosuvastatin (CRESTOR) 40 MG tablet, Take 1 tablet (40 mg total) by mouth daily., Disp: 90 tablet, Rfl: 3   sertraline (ZOLOFT) 100 MG tablet, Take 1 tablet (100 mg total) by mouth daily., Disp: 90 tablet, Rfl: 3   spironolactone (ALDACTONE) 25 MG tablet, Take 1 tablet (25 mg total) by mouth daily., Disp: 90 tablet, Rfl: 1      Objective:    BP 130/69   Pulse 78   Temp 97.6 F (36.4 C) (Temporal)   Ht 6' 1"$  (1.854 m)   Wt 158 lb 9.6 oz (71.9 kg)   SpO2 98%   BMI 20.92 kg/m   Wt Readings from Last 3 Encounters:  01/19/23 158 lb 9.6 oz (71.9 kg)  01/12/23 165 lb (74.8 kg)  12/14/22 155 lb (70.3 kg)    Physical Exam Constitutional:      General: He is not in acute distress.    Appearance: Normal appearance. He is normal weight. He is not ill-appearing, toxic-appearing or diaphoretic.  Cardiovascular:     Rate and Rhythm: Normal rate.  Pulmonary:     Effort: Pulmonary effort is normal.  Musculoskeletal:        General: Normal range of motion.  Skin:    Comments: Left toes with healing scabbed over lesions with some mild erythema, not warm to touch no discharge.   Neurological:     General: No focal deficit present.     Mental Status: He is alert and oriented to person, place, and time. Mental status is at baseline.  Psychiatric:        Mood and Affect: Mood normal.        Behavior: Behavior normal.        Thought Content: Thought content normal.        Judgment: Judgment normal.              Assessment & Plan:  At high risk for infection Assessment & Plan: Referral to podiatry to cut toenails  moving forward.   Orders: -     Ambulatory referral to Podiatry  PAD (peripheral artery disease) ([place]) -     Ambulatory referral to Podiatry  DM (diabetes mellitus) with peripheral vascular complication ([place]) -     Ambulatory referral to [place] / creatinine urine ratio; Future -     Comprehensive metabolic panel; Future -     Hemoglobin A1c; Future  Hypertriglyceridemia Assessment & Plan: Ordered lipid panel, pending results. Work on low cholesterol diet and exercise as tolerated   Orders: -     Lipid panel; Future  Bilateral hearing loss, unspecified hearing loss type Assessment & Plan: Improved with hearing aids now   Conductive hearing loss of right ear, unspecified hearing status on contralateral side Assessment & Plan: Improved with hearing aids now   Vascular disease, peripheral ([place]) Assessment & Plan: Pt to f/u with vascular doctor as scheduled.   Essential hypertension Assessment & Plan: Stable.       Return in about 6 months (around 07/20/2023) for schedule lab only f/u when next available pt to come fasting. , f/u diabetes.  Jordan Pancoast, MSN, APRN, FNP-C [facility]

## 2023-01-19 NOTE — Assessment & Plan Note (Signed)
Ordered lipid panel, pending results. Work on low cholesterol diet and exercise as tolerated ? ?

## 2023-01-24 ENCOUNTER — Other Ambulatory Visit (INDEPENDENT_AMBULATORY_CARE_PROVIDER_SITE_OTHER): Payer: 59

## 2023-01-24 DIAGNOSIS — E1151 Type 2 diabetes mellitus with diabetic peripheral angiopathy without gangrene: Secondary | ICD-10-CM | POA: Diagnosis not present

## 2023-01-24 DIAGNOSIS — E781 Pure hyperglyceridemia: Secondary | ICD-10-CM

## 2023-01-24 LAB — MICROALBUMIN / CREATININE URINE RATIO
Creatinine,U: 115.6 mg/dL
Microalb Creat Ratio: 9 mg/g (ref 0.0–30.0)
Microalb, Ur: 10.4 mg/dL — ABNORMAL HIGH (ref 0.0–1.9)

## 2023-01-24 LAB — LDL CHOLESTEROL, DIRECT: Direct LDL: 105 mg/dL

## 2023-01-24 LAB — COMPREHENSIVE METABOLIC PANEL
ALT: 9 U/L (ref 0–53)
AST: 12 U/L (ref 0–37)
Albumin: 3.9 g/dL (ref 3.5–5.2)
Alkaline Phosphatase: 51 U/L (ref 39–117)
BUN: 16 mg/dL (ref 6–23)
CO2: 27 mEq/L (ref 19–32)
Calcium: 9.5 mg/dL (ref 8.4–10.5)
Chloride: 103 mEq/L (ref 96–112)
Creatinine, Ser: 1.12 mg/dL (ref 0.40–1.50)
GFR: 66.55 mL/min (ref >60.00)
Glucose, Bld: 126 mg/dL — ABNORMAL HIGH (ref 70–99)
Potassium: 4.2 mEq/L (ref 3.5–5.1)
Sodium: 137 mEq/L (ref 135–145)
Total Bilirubin: 0.3 mg/dL (ref 0.2–1.2)
Total Protein: 7.4 g/dL (ref 6.0–8.3)

## 2023-01-24 LAB — HEMOGLOBIN A1C: Hgb A1c MFr Bld: 6.2 % (ref 4.6–6.5)

## 2023-01-24 LAB — LIPID PANEL
Cholesterol: 314 mg/dL — ABNORMAL HIGH (ref 0–200)
HDL: 30.1 mg/dL — ABNORMAL LOW (ref >39.00)
Total CHOL/HDL Ratio: 10
Triglycerides: 1006 mg/dL — ABNORMAL HIGH (ref 0.0–149.0)

## 2023-01-25 ENCOUNTER — Other Ambulatory Visit: Payer: Self-pay | Admitting: Family

## 2023-01-25 DIAGNOSIS — E781 Pure hyperglyceridemia: Secondary | ICD-10-CM

## 2023-01-25 MED ORDER — ICOSAPENT ETHYL 1 G PO CAPS: 2.0000 g | ORAL_CAPSULE | Freq: Two times a day (BID) | ORAL | 1 refills | Status: DC

## 2023-01-25 NOTE — Progress Notes (Signed)
Has pt been taking his cholesterol medication daily as directed? His triglycerides are in the 1000' which is severe, at increased risk for pancreatitis at this level.  I suggest we stop fenofibrate and start vascepa. I am going to try to get this approved.  He will start as directed, and return in six weeks fasting for lipid panel repeat to see if this number comes down. Work on low cholesterol diet. Continue crestor 40 mg once daily  [facility].

## 2023-01-27 ENCOUNTER — Other Ambulatory Visit: Payer: Self-pay | Admitting: Family

## 2023-01-27 ENCOUNTER — Telehealth: Payer: Self-pay | Admitting: Family

## 2023-01-27 DIAGNOSIS — E781 Pure hyperglyceridemia: Secondary | ICD-10-CM

## 2023-01-27 DIAGNOSIS — I739 Peripheral vascular disease, unspecified: Secondary | ICD-10-CM

## 2023-01-27 MED ORDER — ICOSAPENT ETHYL 1 G PO CAPS: 2.0000 g | ORAL_CAPSULE | Freq: Two times a day (BID) | ORAL | 1 refills | Status: DC

## 2023-01-27 NOTE — Telephone Encounter (Signed)
Prescription Request  01/27/2023  Is this a "Controlled Substance" medicine? No  LOV: 01/19/2023  What is the name of the medication or equipment? rosuvastatin (CRESTOR) 40 MG tablet   Have you contacted your pharmacy to request a refill? No   Which pharmacy would you like this sent to?    [id number]: [phone]: [phone]   Patient notified that their request is being sent to the clinical staff for review and that they should receive a response within 2 business days.   Please advise at Mobile [phone](mobile)  Pt's sister stated she was told the meds would be called in after receiving the pt's lab results from nurse. Pt's sister stated she visited the pharmacy & they said they had no prescriptions for the pt. Pt's sister mentioned they're going out of town soon & need meds before leaving.

## 2023-01-27 NOTE — Telephone Encounter (Signed)
[facility]

## 2023-01-27 NOTE — Telephone Encounter (Signed)
Patient wife called about icosapent Ethyl (VASCEPA) 1 g capsule, She said they are going out of town today and said [facility]. She asked if it could please be resent

## 2023-01-27 NOTE — Telephone Encounter (Signed)
Medication resent to pharmacy  

## 2023-01-27 NOTE — Telephone Encounter (Signed)
[facility] and was advised that they do not have the Vascepa prescription that was sent in to them. Please resend. Thanks

## 2023-01-27 NOTE — Addendum Note (Signed)
Addended by: Vaughan Browner on: 01/27/2023 02:48 PM   Modules accepted: Orders

## 2023-01-30 ENCOUNTER — Encounter (INDEPENDENT_AMBULATORY_CARE_PROVIDER_SITE_OTHER): Payer: Self-pay | Admitting: Nurse Practitioner

## 2023-01-30 NOTE — Progress Notes (Signed)
Subjective:    Patient ID: Jordan Arellano, male    DOB: [birth year], 72 y.o.   MRN: [id number] Chief Complaint  Patient presents with   Follow-up    Ultrasound follow up     The patient returns to the office for followup and review status post angiogram with intervention on 12/14/2022.   Procedure: Procedure(s) Performed:             1.  Abdominal aortogram             2.  Bilateral distal runoff             3.  Percutaneous transluminal angioplasty and stent placement right common iliac artery; "kissing balloon" technique             4.  Percutaneous transluminal and plasty and stent placement left common iliac artery; "kissing balloon" technique             5.  Ultrasound guided access bilateral common femoral arteries             6.  StarClose closure device bilateral common femoral arteries   The patient notes improvement in the lower extremity symptoms. No interval shortening of the patient's claudication distance or rest pain symptoms. No new ulcers or wounds have occurred since the last visit.  The patient feels that his current wounds are healing.  There have been no significant changes to the patient's overall health care.  No documented history of amaurosis fugax or recent TIA symptoms. There are no recent neurological changes noted. No documented history of DVT, PE or superficial thrombophlebitis. The patient denies recent episodes of angina or shortness of breath.   ABI's Rt=bka and Lt=0.48  (previous ABI's Rt=bka and Lt=0.50) Duplex US of the left lower extremity reveals monophasic tibial artery waveforms.    Review of Systems  Skin:  Positive for wound.  All other systems reviewed and are negative.      Objective:   Physical Exam Vitals reviewed.  HENT:     Head: Normocephalic.  Cardiovascular:     Rate and Rhythm: Normal rate.     Pulses:          Dorsalis pedis pulses are detected w/ Doppler on the left side.       Posterior tibial pulses are detected w/  Doppler on the left side.  Pulmonary:     Effort: Pulmonary effort is normal.  Musculoskeletal:     Right Lower Extremity: Right leg is amputated below knee.  Skin:    General: Skin is warm and dry.  Neurological:     Mental Status: He is alert and oriented to person, place, and time.  Psychiatric:        Mood and Affect: Mood normal.        Behavior: Behavior normal.        Thought Content: Thought content normal.        Judgment: Judgment normal.     BP 115/73 (BP Location: Left Arm)   Pulse 71   Resp 18   Ht '6\' 1"'$  (1.854 m)   Wt 165 lb (74.8 kg)   BMI 21.77 kg/m   Past Medical History:  Diagnosis Date   Basal cell carcinoma 08/11/2022   R forearm - ED&C   Depression    Foot ulcer ([place]) 12/12/2016   Hyperlipidemia    Hypertension    PAD (peripheral artery disease) ([place]) ~2007   s/p R BKA for non-healing wound   Stroke (  [place]) 03/2014   MRI: Acute nonhemorrhagic left paracentral pontine infarct. 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   TIA (transient ischemic attack) 01/2014    Social History   Socioeconomic History   Marital status: Widowed    Spouse name: Not on file   Number of children: 2   Years of education: Not on file   Highest education level: Not on file  Occupational History    Comment: Disabled  Tobacco Use   Smoking status: Some Days    Packs/day: 0.50    Years: 60.00    Total pack years: 30.00    Types: Cigarettes   Smokeless tobacco: Never  Vaping Use   Vaping Use: Never used  Substance and Sexual Activity   Alcohol use: No   Drug use: No   Sexual activity: Not Currently    Birth control/protection: Abstinence  Other Topics Concern   Not on file  Social History Narrative   One boy youngest, killed in [place] Determinants of Health   Financial Resource Strain: Low Risk  (10/08/2021)   Overall Financial Resource Strain (CARDIA)    Difficulty of Paying  Living Expenses: Not hard at all  Food Insecurity: No Food Insecurity (04/07/2022)   Hunger Vital Sign    Worried About Running Out of Food in the Last Year: Never true    [place] in the Last Year: Never true  Transportation Needs: No Transportation Needs (04/07/2022)   PRAPARE - Hydrologist (Medical): No    Lack of Transportation (Non-Medical): No  Physical Activity: Insufficiently Active (04/07/2022)   Exercise Vital Sign    Days of Exercise per Week: 2 days    Minutes of Exercise per Session: 30 min  Stress: No Stress Concern Present (04/07/2022)   [place]    Feeling of Stress : Not at all  Social Connections: Socially Isolated (04/07/2022)   Social Connection and Isolation Panel [NHANES]    Frequency of Communication with Friends and Family: Three times a week    Frequency of Social Gatherings with Friends and Family: Three times a week    Attends Religious Services: Never    Active Member of Clubs or Organizations: No    Attends Archivist Meetings: Never    Marital Status: Widowed  Intimate Partner Violence: Not At Risk (04/07/2022)   Humiliation, Afraid, Rape, and Kick questionnaire    Fear of Current or Ex-Partner: No    Emotionally Abused: No    Physically Abused: No    Sexually Abused: No    Past Surgical History:  Procedure Laterality Date   BELOW KNEE LEG AMPUTATION Right    FEMORAL-POPLITEAL BYPASS GRAFT Left 12/17/2016   Procedure: BYPASS LEFT FEMORAL TO BELOW POPLITEAL ARTERY USING PROPATEN GORE GRAFT;  Surgeon: Rosetta Posner, MD;  Location: [facility];  Service: Vascular;  Laterality: Left;   FEMORAL-POPLITEAL BYPASS GRAFT Left 09/30/2017   Procedure: LEFT LEG ANGIOGRAM,  THROMBECTOMY, FEM-POPLITEAL BYPASS GRAFT, tHROMBECTOMY PERONEAL ARTERY AND POSTERIOR TIBIAL , ENDARTERECTOMY TIBIAL/PERONEAL TRUNK WITH BOVINE PATCH ANGIOPLASTY.;  Surgeon: Conrad Edinburg, MD;   Location: [facility];  Service: Vascular;  Laterality: Left;   LOWER EXTREMITY ANGIOGRAPHY Left 12/14/2022   Procedure: Lower Extremity Angiography;  Surgeon: Katha Cabal, MD;  Location: [facility];  Service: Cardiovascular;  Laterality: Left;   PERIPHERAL VASCULAR  CATHETERIZATION Left 12/14/2016   Procedure: Abdominal Aortogram w/Lower Extremity;  Surgeon: Angelia Mould, MD;  Location: [facility];  Service: Cardiovascular;  Laterality: Left;   right cataract extraction     TRANSTHORACIC ECHOCARDIOGRAM  01/2014   To evaluate possible CVA: EF 55-60%. GR 1 DD. No significant valvular lesions    Family History  Problem Relation Age of Onset   Hypertension Mother        Does not know history   Heart disease Mother    Stroke Mother    Diabetes Mother    Hypertension Father    Heart disease Father    Stroke Father    Diabetes Sister    Hypertension Sister    Heart disease Brother    Hypertension Brother     No Active Allergies     Latest Ref Rng & Units 07/21/2022   12:09 PM 06/27/2020   10:51 AM 11/21/2018   10:49 AM  CBC  WBC 4.0 - 10.5 K/uL 13.6  9.9  12.2   Hemoglobin 13.0 - 17.0 g/dL 12.8  13.1  13.5   Hematocrit 39.0 - 52.0 % 40.0  39.4  39.7   Platelets 150.0 - 400.0 K/uL 371.0  282.0  342.0       CMP     Component Value Date/Time   NA 137 01/24/2023 0850   NA 139 02/03/2016 0000   K 4.2 01/24/2023 0850   CL 103 01/24/2023 0850   CO2 27 01/24/2023 0850   GLUCOSE 126 (H) 01/24/2023 0850   BUN 16 01/24/2023 0850   BUN 28 (A) 02/03/2016 0000   CREATININE 1.12 01/24/2023 0850   CALCIUM 9.5 01/24/2023 0850   PROT 7.4 01/24/2023 0850   ALBUMIN 3.9 01/24/2023 0850   AST 12 01/24/2023 0850   ALT 9 01/24/2023 0850   ALKPHOS 51 01/24/2023 0850   BILITOT 0.3 01/24/2023 0850   GFRNONAA >60 12/14/2022 0753   GFRAA >60 10/02/2017 0943     VAS [place] ABI WITH/WO TBI  Result Date: 01/14/2023  LOWER EXTREMITY DOPPLER STUDY Patient Name:  ADREYAN SEYLLER  Date of Exam:   01/12/2023 Medical Rec #: [id number]      Accession #:    [id number] Date of Birth: 16-Apr-1951      Patient Gender: M Patient Age:   45 years Exam Location:  [facility] Procedure:      VAS [place] ABI WITH/WO TBI Referring Phys: GREGORY SCHNIER --------------------------------------------------------------------------------  Indications: Claudication, and peripheral artery disease. High Risk Factors: Hypertension, hyperlipidemia, Diabetes, current smoker.  Vascular Interventions: 12/14/2022 Bilateral common iliac artery kissing                         stents. Performing Technologist: Delorise Shiner RVT  Examination Guidelines: A complete evaluation includes at minimum, Doppler waveform signals and systolic blood pressure reading at the level of bilateral brachial, anterior tibial, and posterior tibial arteries, when vessel segments are accessible. Bilateral testing is considered an integral part of a complete examination. Photoelectric Plethysmograph (PPG) waveforms and toe systolic pressure readings are included as required and additional duplex testing as needed. Limited examinations for reoccurring indications may be performed as noted.  ABI Findings: +--------+------------------+-----+--------+--------+ Right   Rt Pressure (mmHg)IndexWaveformComment  +--------+------------------+-----+--------+--------+ [id number]                                     +--------+------------------+-----+--------+--------+ +---------+------------------+-----+----------+-------+  Left     Lt Pressure (mmHg)IndexWaveform  Comment +---------+------------------+-----+----------+-------+ Brachial 151                                      +---------+------------------+-----+----------+-------+ PTA      70                0.46 monophasic        +---------+------------------+-----+----------+-------+ DP       72                0.48 monophasic         +---------+------------------+-----+----------+-------+ Great Toe31                0.21                   +---------+------------------+-----+----------+-------+ +-------+-----------+-----------+------------+------------+ ABI/TBIToday's ABIToday's TBIPrevious ABIPrevious TBI +-------+-----------+-----------+------------+------------+ Right  BKA        BKA        BKA         BKA          +-------+-----------+-----------+------------+------------+ Left   0.48       0.21       0.50        0.63         +-------+-----------+-----------+------------+------------+ Left ABIs appear essentially unchanged compared to prior study on 09/28/2022.  Summary: Left: Resting left ankle-brachial index indicates severe left lower extremity arterial disease. The left toe-brachial index is abnormal. Limited imaging showed patent left common iliac artery stent. *See table(s) above for measurements and observations.  Electronically signed by Hortencia Pilar MD on 01/14/2023 at 11:06:21 AM.    Final        Assessment & Plan:   1. Peripheral arterial disease with history of revascularization ([place]) I discussed with the patient that despite intervention the patient has not had significant improvement in his ABIs.  Given the wounds there is concern that they may not heal well.  I discussed that it would be in his best interest to move forward with an angiogram but he is not ready to move forward at this time.  Instead we will have him return in 6 to 8 weeks and we will repeat studies and evaluate progress with wound healing.  At that time we can discuss angiogram again.  2. DM (diabetes mellitus) with peripheral vascular complication (HCC) Continue hypoglycemic medications as already ordered, these medications have been reviewed and there are no changes at this time.  Hgb A1C to be monitored as already arranged by primary service  3. Essential hypertension Continue antihypertensive medications as already  ordered, these medications have been reviewed and there are no changes at this time.   Current Outpatient Medications on File Prior to Visit  Medication Sig Dispense Refill   acetaminophen (TYLENOL) 500 MG tablet Take 500 mg by mouth every 6 (six) hours as needed.     aspirin 81 MG tablet Take 81 mg by mouth daily.     blood glucose meter kit and supplies KIT Dispense based on patient and insurance preference. Use two times daily as directed (before breakfast and at bedtime. (FOR ICD-10: E11.51) 1 each 0   clopidogrel (PLAVIX) 75 MG tablet Take 1 tablet (75 mg total) by mouth daily. 30 tablet 5   metFORMIN (GLUCOPHAGE) 850 MG tablet TAKE 1 TABLET BY MOUTH TWICE  DAILY WITH MEALS 200 tablet 2   olmesartan (BENICAR) 40  MG tablet TAKE 1 TABLET BY MOUTH DAILY 100 tablet 3   rosuvastatin (CRESTOR) 40 MG tablet Take 1 tablet (40 mg total) by mouth daily. 90 tablet 3   sertraline (ZOLOFT) 100 MG tablet Take 1 tablet (100 mg total) by mouth daily. 90 tablet 3   spironolactone (ALDACTONE) 25 MG tablet Take 1 tablet (25 mg total) by mouth daily. 90 tablet 1   No current facility-administered medications on file prior to visit.    There are no Patient Instructions on file for this visit. No follow-ups on file.   Kris Hartmann, NP

## 2023-01-31 ENCOUNTER — Ambulatory Visit: Payer: 59 | Admitting: Podiatry

## 2023-01-31 ENCOUNTER — Encounter: Payer: Self-pay | Admitting: Podiatry

## 2023-01-31 VITALS — BP 136/88 | HR 70

## 2023-01-31 DIAGNOSIS — M79674 Pain in right toe(s): Secondary | ICD-10-CM

## 2023-01-31 DIAGNOSIS — I70222 Atherosclerosis of native arteries of extremities with rest pain, left leg: Secondary | ICD-10-CM | POA: Diagnosis not present

## 2023-01-31 DIAGNOSIS — M79675 Pain in left toe(s): Secondary | ICD-10-CM

## 2023-01-31 DIAGNOSIS — B351 Tinea unguium: Secondary | ICD-10-CM

## 2023-01-31 DIAGNOSIS — E1151 Type 2 diabetes mellitus with diabetic peripheral angiopathy without gangrene: Secondary | ICD-10-CM | POA: Diagnosis not present

## 2023-01-31 DIAGNOSIS — E119 Type 2 diabetes mellitus without complications: Secondary | ICD-10-CM | POA: Diagnosis not present

## 2023-01-31 MED ORDER — ROSUVASTATIN CALCIUM 40 MG PO TABS: 40.0000 mg | ORAL_TABLET | Freq: Every day | ORAL | 3 refills | Status: DC

## 2023-01-31 NOTE — Progress Notes (Signed)
  Subjective:  Patient ID: Jordan Arellano, male    DOB: 31-Jul-1951,  MRN: [id number]  Chief Complaint  Patient presents with   Nail Problem    "He's Diabetic and his doctors said a Podiatrist needs to be cutting them." N - toenails L - 1-5 left foot D - 3 weeks ago O - gradually C - thick, Diabetic A - he tries to cut himself and cuts his toes T - clip them myself    72 y.o. male presents with the above complaint. History confirmed with patient.  He has peripheral arterial disease had a recent stent placed in the left leg.  Has a right below-knee amputation uses a prosthetic  Objective:  Physical Exam: warm, good capillary refill, no trophic changes or ulcerative lesions, and reduced DP and PT pulse, he has thickened elongated nails on the left foot causing discomfort, callus medial hallux   Assessment:   1. Pain due to onychomycosis of toenails of both feet   2. DM (diabetes mellitus) with peripheral vascular complication (HCC)   3. Atherosclerosis of native artery of left lower extremity with rest pain [facility])      Plan:  Patient was evaluated and treated and all questions answered.  Discussed the etiology and treatment options for the condition in detail with the patient. Educated patient on the topical and oral treatment options for mycotic nails. Recommended debridement of the nails today. Sharp and mechanical debridement performed of all painful and mycotic nails today. Nails debrided in length and thickness using a nail nipper to level of comfort. Discussed treatment options including appropriate shoe gear. Follow up as needed for painful nails.  Silicone pad dispensed to offload the callused area, discussed risk of ulceration here.  Patient educated on diabetes. Discussed proper diabetic foot care and discussed risks and complications of disease. Educated patient in depth on reasons to return to the office immediately should he/she discover anything concerning or new on the  feet. All questions answered. Discussed proper shoes as well.    Return in about 3 months (around 05/01/2023) for at risk diabetic foot care.

## 2023-01-31 NOTE — Telephone Encounter (Signed)
Confirmed with the pharmacy that they have this medication ready with no copay. I called and spoke with [name] Miller(sibling) at [phone] [facility]) and advised that she could go pick up this medication for Carmen to take.

## 2023-01-31 NOTE — Patient Instructions (Signed)
More silicone pads can be purchased from:  [email or web address]

## 2023-02-02 ENCOUNTER — Telehealth: Payer: Self-pay | Admitting: Family

## 2023-02-02 NOTE — Telephone Encounter (Signed)
Left message to return call to our office.  

## 2023-02-02 NOTE — Telephone Encounter (Signed)
Pt sister Otho Perl called in requesting a callback regarding a clarification on pt medication . Please advise # [phone]

## 2023-02-07 NOTE — Telephone Encounter (Signed)
Pt called back requesting help with questions he has about the meds, rosuvastatin (CRESTOR) 40 MG tablet & icosapent Ethyl (VASCEPA) 1 g capsule? Call back # [phone]

## 2023-02-07 NOTE — Telephone Encounter (Signed)
Called sister states that when they went to pharmacy and was told that if they took both meds it could cause harm. Wanted to double check that you wanted him to take both Crestor and Vascepa

## 2023-02-10 NOTE — Telephone Encounter (Signed)
Spoke with [name]/DPR and advised the above response. He is now eating more healthier foods now.

## 2023-02-10 NOTE — Telephone Encounter (Signed)
There are no interactions with these two medications.

## 2023-02-21 ENCOUNTER — Other Ambulatory Visit (INDEPENDENT_AMBULATORY_CARE_PROVIDER_SITE_OTHER): Payer: Self-pay | Admitting: Nurse Practitioner

## 2023-02-21 DIAGNOSIS — I70222 Atherosclerosis of native arteries of extremities with rest pain, left leg: Secondary | ICD-10-CM

## 2023-02-28 ENCOUNTER — Ambulatory Visit (INDEPENDENT_AMBULATORY_CARE_PROVIDER_SITE_OTHER): Payer: 59 | Admitting: Vascular Surgery

## 2023-02-28 ENCOUNTER — Other Ambulatory Visit (INDEPENDENT_AMBULATORY_CARE_PROVIDER_SITE_OTHER): Payer: Self-pay | Admitting: Nurse Practitioner

## 2023-02-28 ENCOUNTER — Encounter (INDEPENDENT_AMBULATORY_CARE_PROVIDER_SITE_OTHER): Payer: 59

## 2023-02-28 DIAGNOSIS — Z9889 Other specified postprocedural states: Secondary | ICD-10-CM

## 2023-03-01 ENCOUNTER — Other Ambulatory Visit: Payer: Self-pay | Admitting: Family

## 2023-03-01 DIAGNOSIS — E781 Pure hyperglyceridemia: Secondary | ICD-10-CM

## 2023-03-08 ENCOUNTER — Other Ambulatory Visit (INDEPENDENT_AMBULATORY_CARE_PROVIDER_SITE_OTHER): Payer: 59

## 2023-03-08 DIAGNOSIS — E781 Pure hyperglyceridemia: Secondary | ICD-10-CM

## 2023-03-08 LAB — LIPID PANEL
Cholesterol: 127 mg/dL (ref 0–200)
HDL: 28.3 mg/dL — ABNORMAL LOW (ref >39.00)
NonHDL: 98.55
Total CHOL/HDL Ratio: 4
Triglycerides: 263 mg/dL — ABNORMAL HIGH (ref 0.0–149.0)
VLDL: 52.6 mg/dL — ABNORMAL HIGH (ref 0.0–40.0)

## 2023-03-08 LAB — LDL CHOLESTEROL, DIRECT: Direct LDL: 54 mg/dL

## 2023-03-08 NOTE — Progress Notes (Signed)
Triglycerides have improved tremendously from 1006 to 263.  Continue with vascepa.

## 2023-03-14 ENCOUNTER — Ambulatory Visit: Payer: 59 | Admitting: Dermatology

## 2023-03-19 ENCOUNTER — Other Ambulatory Visit: Payer: Self-pay | Admitting: Nurse Practitioner

## 2023-03-19 DIAGNOSIS — E1151 Type 2 diabetes mellitus with diabetic peripheral angiopathy without gangrene: Secondary | ICD-10-CM

## 2023-03-20 ENCOUNTER — Emergency Department: Payer: 59

## 2023-03-20 ENCOUNTER — Inpatient Hospital Stay
Admission: EM | Admit: 2023-03-20 | Discharge: 2023-03-25 | DRG: 481 | Disposition: A | Discharge status: Skilled Nursing Facility | Payer: 59 | Attending: Student | Admitting: Student

## 2023-03-20 ENCOUNTER — Other Ambulatory Visit: Payer: Self-pay

## 2023-03-20 DIAGNOSIS — Z8249 Family history of ischemic heart disease and other diseases of the circulatory system: Secondary | ICD-10-CM

## 2023-03-20 DIAGNOSIS — Q282 Arteriovenous malformation of cerebral vessels: Secondary | ICD-10-CM

## 2023-03-20 DIAGNOSIS — Y9301 Activity, walking, marching and hiking: Secondary | ICD-10-CM | POA: Diagnosis present

## 2023-03-20 DIAGNOSIS — I10 Essential (primary) hypertension: Secondary | ICD-10-CM | POA: Diagnosis present

## 2023-03-20 DIAGNOSIS — D72829 Elevated white blood cell count, unspecified: Secondary | ICD-10-CM

## 2023-03-20 DIAGNOSIS — I452 Bifascicular block: Secondary | ICD-10-CM | POA: Diagnosis present

## 2023-03-20 DIAGNOSIS — F32A Depression, unspecified: Secondary | ICD-10-CM | POA: Diagnosis present

## 2023-03-20 DIAGNOSIS — S72001A Fracture of unspecified part of neck of right femur, initial encounter for closed fracture: Secondary | ICD-10-CM | POA: Diagnosis present

## 2023-03-20 DIAGNOSIS — S72144A Nondisplaced intertrochanteric fracture of right femur, initial encounter for closed fracture: Principal | ICD-10-CM | POA: Diagnosis present

## 2023-03-20 DIAGNOSIS — D62 Acute posthemorrhagic anemia: Secondary | ICD-10-CM | POA: Diagnosis not present

## 2023-03-20 DIAGNOSIS — Z7902 Long term (current) use of antithrombotics/antiplatelets: Secondary | ICD-10-CM

## 2023-03-20 DIAGNOSIS — Z8673 Personal history of transient ischemic attack (TIA), and cerebral infarction without residual deficits: Secondary | ICD-10-CM

## 2023-03-20 DIAGNOSIS — Z7982 Long term (current) use of aspirin: Secondary | ICD-10-CM

## 2023-03-20 DIAGNOSIS — I739 Peripheral vascular disease, unspecified: Secondary | ICD-10-CM | POA: Diagnosis present

## 2023-03-20 DIAGNOSIS — N179 Acute kidney failure, unspecified: Secondary | ICD-10-CM

## 2023-03-20 DIAGNOSIS — Z9841 Cataract extraction status, right eye: Secondary | ICD-10-CM

## 2023-03-20 DIAGNOSIS — E1151 Type 2 diabetes mellitus with diabetic peripheral angiopathy without gangrene: Secondary | ICD-10-CM | POA: Diagnosis present

## 2023-03-20 DIAGNOSIS — Z89511 Acquired absence of right leg below knee: Secondary | ICD-10-CM

## 2023-03-20 DIAGNOSIS — E559 Vitamin D deficiency, unspecified: Secondary | ICD-10-CM | POA: Diagnosis present

## 2023-03-20 DIAGNOSIS — M25551 Pain in right hip: Secondary | ICD-10-CM | POA: Diagnosis not present

## 2023-03-20 DIAGNOSIS — E785 Hyperlipidemia, unspecified: Secondary | ICD-10-CM | POA: Diagnosis present

## 2023-03-20 DIAGNOSIS — Z833 Family history of diabetes mellitus: Secondary | ICD-10-CM

## 2023-03-20 DIAGNOSIS — Z85828 Personal history of other malignant neoplasm of skin: Secondary | ICD-10-CM

## 2023-03-20 DIAGNOSIS — Z823 Family history of stroke: Secondary | ICD-10-CM

## 2023-03-20 DIAGNOSIS — Z79899 Other long term (current) drug therapy: Secondary | ICD-10-CM

## 2023-03-20 DIAGNOSIS — W19XXXA Unspecified fall, initial encounter: Secondary | ICD-10-CM

## 2023-03-20 DIAGNOSIS — F17210 Nicotine dependence, cigarettes, uncomplicated: Secondary | ICD-10-CM | POA: Diagnosis present

## 2023-03-20 DIAGNOSIS — Z7984 Long term (current) use of oral hypoglycemic drugs: Secondary | ICD-10-CM

## 2023-03-20 DIAGNOSIS — E611 Iron deficiency: Secondary | ICD-10-CM | POA: Diagnosis present

## 2023-03-20 DIAGNOSIS — W010XXA Fall on same level from slipping, tripping and stumbling without subsequent striking against object, initial encounter: Secondary | ICD-10-CM | POA: Diagnosis present

## 2023-03-20 DIAGNOSIS — Y92009 Unspecified place in unspecified non-institutional (private) residence as the place of occurrence of the external cause: Secondary | ICD-10-CM

## 2023-03-20 NOTE — ED Triage Notes (Signed)
Pt tripped while walking tonight and landed on his right hip. Denies LOC. Pt has right BKA which causes gait unsteadiness and he attributes this to cause of fall. Pt's pain "10/10" and EMS administered Fentanyl , which pt reports has improved to "7/10".

## 2023-03-20 NOTE — Progress Notes (Deleted)
MRN : [id number]  Jordan Arellano is a 72 y.o. ([birth year]) male who presents with chief complaint of check circulation.  History of Present Illness:   The patient returns to the office for followup and review status post angiogram with intervention on 12/14/2022.    Procedure: Procedure(s) Performed:             1.  Abdominal aortogram             2.  Bilateral distal runoff             3.  Percutaneous transluminal angioplasty and stent placement right common iliac artery; "kissing balloon" technique             4.  Percutaneous transluminal and plasty and stent placement left common iliac artery; "kissing balloon" technique             5.  Ultrasound guided access bilateral common femoral arteries             6.  StarClose closure device bilateral common femoral arteries     The patient notes improvement in the lower extremity symptoms. No interval shortening of the patient's claudication distance or rest pain symptoms. No new ulcers or wounds have occurred since the last visit.  The patient feels that his current wounds are healing.   There have been no significant changes to the patient's overall health care.   No documented history of amaurosis fugax or recent TIA symptoms. There are no recent neurological changes noted. No documented history of DVT, PE or superficial thrombophlebitis. The patient denies recent episodes of angina or shortness of breath.    ABI's Rt=bka and Lt=0.48  (previous ABI's Rt=bka and Lt=0.50) Duplex US of the left lower extremity reveals monophasic tibial artery waveforms.  No outpatient medications have been marked as taking for the 03/21/23 encounter (Appointment) with Gilda Crease, Latina Craver, MD.    Past Medical History:  Diagnosis Date   Basal cell carcinoma 08/11/2022   R forearm - ED&C   Depression    Foot ulcer (HCC) 12/12/2016   Hyperlipidemia    Hypertension    PAD (peripheral artery disease) (HCC) ~2007   s/p R BKA for  non-healing wound   Stroke (HCC) 03/2014   MRI: Acute nonhemorrhagic left paracentral pontine infarct. 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   TIA (transient ischemic attack) 01/2014    Past Surgical History:  Procedure Laterality Date   BELOW KNEE LEG AMPUTATION Right    FEMORAL-POPLITEAL BYPASS GRAFT Left 12/17/2016   Procedure: BYPASS LEFT FEMORAL TO BELOW POPLITEAL ARTERY USING PROPATEN GORE GRAFT;  Surgeon: Larina Earthly, MD;  Location: [facility] OR;  Service: Vascular;  Laterality: Left;   FEMORAL-POPLITEAL BYPASS GRAFT Left 09/30/2017   Procedure: LEFT LEG ANGIOGRAM,  THROMBECTOMY, FEM-POPLITEAL BYPASS GRAFT, tHROMBECTOMY PERONEAL ARTERY AND POSTERIOR TIBIAL , ENDARTERECTOMY TIBIAL/PERONEAL TRUNK WITH BOVINE PATCH ANGIOPLASTY.;  Surgeon: Fransisco Hertz, MD;  Location: [facility] OR;  Service: Vascular;  Laterality: Left;   LOWER EXTREMITY ANGIOGRAPHY Left 12/14/2022   Procedure: Lower Extremity Angiography;  Surgeon: Renford Dills, MD;  Location: [facility] INVASIVE CV LAB;  Service: Cardiovascular;  Laterality: Left;   PERIPHERAL VASCULAR CATHETERIZATION Left 12/14/2016   Procedure: Abdominal Aortogram w/Lower Extremity;  Surgeon: Chuck Hint, MD;  Location: [facility] INVASIVE CV LAB;  Service: Cardiovascular;  Laterality: Left;  right cataract extraction     TRANSTHORACIC ECHOCARDIOGRAM  01/2014   To evaluate possible CVA: EF 55-60%. GR 1 DD. No significant valvular lesions    Social History Social History   Tobacco Use   Smoking status: Some Days    Packs/day: 15.00    Years: 60.00    Additional pack years: 0.00    Total pack years: 900.00    Types: Cigarettes   Smokeless tobacco: Never  Vaping Use   Vaping Use: Never used  Substance Use Topics   Alcohol use: No   Drug use: No    Family History Family History  Problem Relation Age of Onset   Hypertension Mother        Does not know history   Heart  disease Mother    Stroke Mother    Diabetes Mother    Hypertension Father    Heart disease Father    Stroke Father    Diabetes Sister    Hypertension Sister    Heart disease Brother    Hypertension Brother     No Known Allergies   REVIEW OF SYSTEMS (Negative unless checked)  Constitutional: Weight loss  Fever  Chills Cardiac: Chest pain   Chest pressure   Palpitations   Shortness of breath when laying flat   Shortness of breath with exertion. Vascular:  Pain in legs with walking   Pain in legs at rest  History of DVT   Phlebitis   Swelling in legs   Varicose veins   Non-healing ulcers Pulmonary:   Uses home oxygen   Productive cough   Hemoptysis   Wheeze  COPD   Asthma Neurologic:  Dizziness   Seizures   History of stroke   History of TIA  Aphasia   Vissual changes   Weakness or numbness in arm   Weakness or numbness in leg Musculoskeletal:   Joint swelling   Joint pain   Low back pain Hematologic:  Easy bruising  Easy bleeding   Hypercoagulable state   Anemic Gastrointestinal:  Diarrhea   Vomiting  Gastroesophageal reflux/heartburn   Difficulty swallowing. Genitourinary:  Chronic kidney disease   Difficult urination  Frequent urination   Blood in urine Skin:  Rashes   Ulcers  Psychological:  History of anxiety    History of major depression.  Physical Examination  There were no vitals filed for this visit. There is no height or weight on file to calculate BMI. Gen: WD/WN, NAD Head: [place]/AT, No temporalis wasting.  Ear/Nose/Throat: Hearing grossly intact, nares w/o erythema or drainage Eyes: PER, EOMI, sclera nonicteric.  Neck: Supple, no masses.  No bruit or JVD.  Pulmonary:  Good air movement, no audible wheezing, no use of accessory muscles.  Cardiac: RRR, normal S1, S2, no Murmurs. Vascular:  mild trophic changes, no open wounds Vessel Right Left  Radial Palpable Palpable  PT  Not Palpable Not Palpable  DP Not Palpable Not Palpable  Gastrointestinal: soft, non-distended. No guarding/no peritoneal signs.  Musculoskeletal: M/S 5/5 throughout.  No visible deformity.  Neurologic: CN 2-12 intact. Pain and light touch intact in extremities.  Symmetrical.  Speech is fluent. Motor exam as listed above. Psychiatric: Judgment intact, Mood & affect appropriate for pt's clinical situation. Dermatologic: No rashes or ulcers noted.  No changes consistent with cellulitis.   CBC Lab Results  Component Value Date   WBC 13.6 (H) 07/21/2022   HGB 12.8 (L) 07/21/2022   HCT 40.0 07/21/2022   MCV 96.0 07/21/2022   PLT  371.0 07/21/2022    BMET    Component Value Date/Time   NA 137 01/24/2023 0850   NA 139 02/03/2016 0000   K 4.2 01/24/2023 0850   CL 103 01/24/2023 0850   CO2 27 01/24/2023 0850   GLUCOSE 126 (H) 01/24/2023 0850   BUN 16 01/24/2023 0850   BUN 28 (A) 02/03/2016 0000   CREATININE 1.12 01/24/2023 0850   CALCIUM 9.5 01/24/2023 0850   GFRNONAA >60 12/14/2022 0753   GFRAA >60 10/02/2017 0943   CrCl cannot be calculated (Patient's most recent lab result is older than the maximum 21 days allowed.).  COAG Lab Results  Component Value Date   INR 1.00 09/30/2017   INR 1.08 12/14/2016   INR 1.01 03/25/2014    Radiology No results found.   Assessment/Plan There are no diagnoses linked to this encounter.   Levora Dredge, MD  03/20/2023 1:13 PM

## 2023-03-21 ENCOUNTER — Other Ambulatory Visit: Payer: Self-pay

## 2023-03-21 ENCOUNTER — Emergency Department: Payer: 59

## 2023-03-21 ENCOUNTER — Encounter (INDEPENDENT_AMBULATORY_CARE_PROVIDER_SITE_OTHER): Payer: 59

## 2023-03-21 ENCOUNTER — Inpatient Hospital Stay: Payer: 59 | Admitting: Certified Registered"

## 2023-03-21 ENCOUNTER — Inpatient Hospital Stay: Payer: 59

## 2023-03-21 ENCOUNTER — Encounter
Admission: EM | Disposition: A | Discharge status: Skilled Nursing Facility | Payer: Self-pay | Source: Home / Self Care | Attending: Student

## 2023-03-21 ENCOUNTER — Ambulatory Visit (INDEPENDENT_AMBULATORY_CARE_PROVIDER_SITE_OTHER): Payer: 59 | Admitting: Vascular Surgery

## 2023-03-21 ENCOUNTER — Encounter: Payer: Self-pay | Admitting: Internal Medicine

## 2023-03-21 DIAGNOSIS — S72144A Nondisplaced intertrochanteric fracture of right femur, initial encounter for closed fracture: Secondary | ICD-10-CM | POA: Diagnosis present

## 2023-03-21 DIAGNOSIS — E1151 Type 2 diabetes mellitus with diabetic peripheral angiopathy without gangrene: Secondary | ICD-10-CM | POA: Diagnosis present

## 2023-03-21 DIAGNOSIS — N179 Acute kidney failure, unspecified: Secondary | ICD-10-CM | POA: Diagnosis present

## 2023-03-21 DIAGNOSIS — Q282 Arteriovenous malformation of cerebral vessels: Secondary | ICD-10-CM | POA: Diagnosis not present

## 2023-03-21 DIAGNOSIS — Z833 Family history of diabetes mellitus: Secondary | ICD-10-CM | POA: Diagnosis not present

## 2023-03-21 DIAGNOSIS — E785 Hyperlipidemia, unspecified: Secondary | ICD-10-CM | POA: Diagnosis present

## 2023-03-21 DIAGNOSIS — D72829 Elevated white blood cell count, unspecified: Secondary | ICD-10-CM | POA: Diagnosis present

## 2023-03-21 DIAGNOSIS — E559 Vitamin D deficiency, unspecified: Secondary | ICD-10-CM | POA: Diagnosis present

## 2023-03-21 DIAGNOSIS — F32A Depression, unspecified: Secondary | ICD-10-CM | POA: Diagnosis present

## 2023-03-21 DIAGNOSIS — I70222 Atherosclerosis of native arteries of extremities with rest pain, left leg: Secondary | ICD-10-CM

## 2023-03-21 DIAGNOSIS — W19XXXA Unspecified fall, initial encounter: Secondary | ICD-10-CM

## 2023-03-21 DIAGNOSIS — Z9841 Cataract extraction status, right eye: Secondary | ICD-10-CM | POA: Diagnosis not present

## 2023-03-21 DIAGNOSIS — E611 Iron deficiency: Secondary | ICD-10-CM | POA: Diagnosis present

## 2023-03-21 DIAGNOSIS — Z89511 Acquired absence of right leg below knee: Secondary | ICD-10-CM | POA: Diagnosis not present

## 2023-03-21 DIAGNOSIS — I10 Essential (primary) hypertension: Secondary | ICD-10-CM | POA: Diagnosis present

## 2023-03-21 DIAGNOSIS — Z7982 Long term (current) use of aspirin: Secondary | ICD-10-CM | POA: Diagnosis not present

## 2023-03-21 DIAGNOSIS — Z7984 Long term (current) use of oral hypoglycemic drugs: Secondary | ICD-10-CM | POA: Diagnosis not present

## 2023-03-21 DIAGNOSIS — Z8249 Family history of ischemic heart disease and other diseases of the circulatory system: Secondary | ICD-10-CM | POA: Diagnosis not present

## 2023-03-21 DIAGNOSIS — Y9301 Activity, walking, marching and hiking: Secondary | ICD-10-CM | POA: Diagnosis present

## 2023-03-21 DIAGNOSIS — Z79899 Other long term (current) drug therapy: Secondary | ICD-10-CM | POA: Diagnosis not present

## 2023-03-21 DIAGNOSIS — S72001A Fracture of unspecified part of neck of right femur, initial encounter for closed fracture: Secondary | ICD-10-CM | POA: Diagnosis not present

## 2023-03-21 DIAGNOSIS — Z823 Family history of stroke: Secondary | ICD-10-CM | POA: Diagnosis not present

## 2023-03-21 DIAGNOSIS — D62 Acute posthemorrhagic anemia: Secondary | ICD-10-CM | POA: Diagnosis not present

## 2023-03-21 DIAGNOSIS — Z85828 Personal history of other malignant neoplasm of skin: Secondary | ICD-10-CM | POA: Diagnosis not present

## 2023-03-21 DIAGNOSIS — Z8673 Personal history of transient ischemic attack (TIA), and cerebral infarction without residual deficits: Secondary | ICD-10-CM | POA: Diagnosis not present

## 2023-03-21 DIAGNOSIS — I701 Atherosclerosis of renal artery: Secondary | ICD-10-CM

## 2023-03-21 DIAGNOSIS — M25551 Pain in right hip: Secondary | ICD-10-CM | POA: Diagnosis present

## 2023-03-21 DIAGNOSIS — F17210 Nicotine dependence, cigarettes, uncomplicated: Secondary | ICD-10-CM | POA: Diagnosis present

## 2023-03-21 DIAGNOSIS — Y92009 Unspecified place in unspecified non-institutional (private) residence as the place of occurrence of the external cause: Secondary | ICD-10-CM | POA: Diagnosis not present

## 2023-03-21 DIAGNOSIS — Z7902 Long term (current) use of antithrombotics/antiplatelets: Secondary | ICD-10-CM | POA: Diagnosis not present

## 2023-03-21 DIAGNOSIS — E781 Pure hyperglyceridemia: Secondary | ICD-10-CM

## 2023-03-21 DIAGNOSIS — I452 Bifascicular block: Secondary | ICD-10-CM | POA: Diagnosis present

## 2023-03-21 DIAGNOSIS — W010XXA Fall on same level from slipping, tripping and stumbling without subsequent striking against object, initial encounter: Secondary | ICD-10-CM | POA: Diagnosis present

## 2023-03-21 HISTORY — PX: INTRAMEDULLARY (IM) NAIL INTERTROCHANTERIC: SHX5875

## 2023-03-21 LAB — GLUCOSE, CAPILLARY
Glucose-Capillary: 127 mg/dL — ABNORMAL HIGH (ref 70–99)
Glucose-Capillary: 135 mg/dL — ABNORMAL HIGH (ref 70–99)
Glucose-Capillary: 161 mg/dL — ABNORMAL HIGH (ref 70–99)
Glucose-Capillary: 163 mg/dL — ABNORMAL HIGH (ref 70–99)
Glucose-Capillary: 165 mg/dL — ABNORMAL HIGH (ref 70–99)
Glucose-Capillary: 198 mg/dL — ABNORMAL HIGH (ref 70–99)
Glucose-Capillary: 205 mg/dL — ABNORMAL HIGH (ref 70–99)

## 2023-03-21 LAB — CBC WITH DIFFERENTIAL/PLATELET
Abs Immature Granulocytes: 0.08 10*3/uL — ABNORMAL HIGH (ref 0.00–0.07)
Basophils Absolute: 0.1 10*3/uL (ref 0.0–0.1)
Basophils Relative: 1 %
Eosinophils Absolute: 0.1 10*3/uL (ref 0.0–0.5)
Eosinophils Relative: 1 %
HCT: 34.6 % — ABNORMAL LOW (ref 39.0–52.0)
Hemoglobin: 11.2 g/dL — ABNORMAL LOW (ref 13.0–17.0)
Immature Granulocytes: 1 %
Lymphocytes Relative: 12 %
Lymphs Abs: 2 10*3/uL (ref 0.7–4.0)
MCH: 28.7 pg (ref 26.0–34.0)
MCHC: 32.4 g/dL (ref 30.0–36.0)
MCV: 88.7 fL (ref 80.0–100.0)
Monocytes Absolute: 0.8 10*3/uL (ref 0.1–1.0)
Monocytes Relative: 5 %
Neutro Abs: 14.2 10*3/uL — ABNORMAL HIGH (ref 1.7–7.7)
Neutrophils Relative %: 80 %
Platelets: 286 10*3/uL (ref 150–400)
RBC: 3.9 MIL/uL — ABNORMAL LOW (ref 4.22–5.81)
RDW: 14.2 % (ref 11.5–15.5)
WBC: 17.2 10*3/uL — ABNORMAL HIGH (ref 4.0–10.5)
nRBC: 0 % (ref 0.0–0.2)

## 2023-03-21 LAB — BASIC METABOLIC PANEL
Anion gap: 9 (ref 5–15)
BUN: 27 mg/dL — ABNORMAL HIGH (ref 8–23)
CO2: 24 mmol/L (ref 22–32)
Calcium: 9.1 mg/dL (ref 8.9–10.3)
Chloride: 105 mmol/L (ref 98–111)
Creatinine, Ser: 1.66 mg/dL — ABNORMAL HIGH (ref 0.61–1.24)
GFR, Estimated: 44 mL/min — ABNORMAL LOW (ref >60)
Glucose, Bld: 189 mg/dL — ABNORMAL HIGH (ref 70–99)
Potassium: 4.6 mmol/L (ref 3.5–5.1)
Sodium: 138 mmol/L (ref 135–145)

## 2023-03-21 LAB — HIV ANTIBODY (ROUTINE TESTING W REFLEX): HIV Screen 4th Generation wRfx: NONREACTIVE

## 2023-03-21 LAB — SURGICAL PCR SCREEN
MRSA, PCR: NEGATIVE
Staphylococcus aureus: NEGATIVE

## 2023-03-21 LAB — PROTIME-INR
INR: 1.1 (ref 0.8–1.2)
Prothrombin Time: 14.3 seconds (ref 11.4–15.2)

## 2023-03-21 LAB — TYPE AND SCREEN
ABO/RH(D): A POS
Antibody Screen: NEGATIVE

## 2023-03-21 LAB — ABO/RH: ABO/RH(D): A POS

## 2023-03-21 SURGERY — FIXATION, FRACTURE, INTERTROCHANTERIC, WITH INTRAMEDULLARY ROD
Anesthesia: General | Site: Hip | Laterality: Right

## 2023-03-21 MED ORDER — GLYCOPYRROLATE 0.2 MG/ML IJ SOLN
INTRAMUSCULAR | Status: AC
  Filled 2023-03-21: qty 1

## 2023-03-21 MED ORDER — MUPIROCIN 2 % EX OINT
1.0000 | TOPICAL_OINTMENT | Freq: Two times a day (BID) | CUTANEOUS | Status: DC
  Administered 2023-03-21 – 2023-03-25 (×6): 1 via NASAL
  Filled 2023-03-21: qty 22

## 2023-03-21 MED ORDER — GLUCERNA SHAKE PO LIQD
237.0000 mL | Freq: Three times a day (TID) | ORAL | Status: DC
  Administered 2023-03-22 – 2023-03-25 (×10): 237 mL via ORAL
  Filled 2023-03-21: qty 237

## 2023-03-21 MED ORDER — SODIUM CHLORIDE 0.45 % IV SOLN: INTRAVENOUS | Status: DC

## 2023-03-21 MED ORDER — ONDANSETRON HCL 4 MG/2ML IJ SOLN: 4.0000 mg | Freq: Once | INTRAMUSCULAR | Status: DC | PRN

## 2023-03-21 MED ORDER — METOCLOPRAMIDE HCL 5 MG/ML IJ SOLN: 5.0000 mg | Freq: Three times a day (TID) | INTRAMUSCULAR | Status: DC | PRN

## 2023-03-21 MED ORDER — SENNA 8.6 MG PO TABS
1.0000 | ORAL_TABLET | Freq: Two times a day (BID) | ORAL | Status: DC
  Administered 2023-03-21 – 2023-03-25 (×8): 8.6 mg via ORAL
  Filled 2023-03-21 (×8): qty 1

## 2023-03-21 MED ORDER — SPIRONOLACTONE 25 MG PO TABS
25.0000 mg | ORAL_TABLET | Freq: Every day | ORAL | Status: DC
  Administered 2023-03-22 – 2023-03-24 (×3): 25 mg via ORAL
  Filled 2023-03-21 (×4): qty 1

## 2023-03-21 MED ORDER — SERTRALINE HCL 50 MG PO TABS
100.0000 mg | ORAL_TABLET | Freq: Every day | ORAL | Status: DC
  Administered 2023-03-22 – 2023-03-25 (×4): 100 mg via ORAL
  Filled 2023-03-21 (×4): qty 2

## 2023-03-21 MED ORDER — LIDOCAINE HCL (CARDIAC) PF 100 MG/5ML IV SOSY
PREFILLED_SYRINGE | INTRAVENOUS | Status: DC | PRN
  Administered 2023-03-21: 50 mg via INTRAVENOUS

## 2023-03-21 MED ORDER — HYDROCODONE-ACETAMINOPHEN 5-325 MG PO TABS
1.0000 | ORAL_TABLET | ORAL | Status: DC | PRN
  Administered 2023-03-21: 1 via ORAL
  Filled 2023-03-21: qty 1

## 2023-03-21 MED ORDER — INSULIN ASPART 100 UNIT/ML IJ SOLN
0.0000 [IU] | INTRAMUSCULAR | Status: DC
  Administered 2023-03-21: 2 [IU] via SUBCUTANEOUS
  Administered 2023-03-21: 3 [IU] via SUBCUTANEOUS
  Administered 2023-03-21: 1 [IU] via SUBCUTANEOUS
  Administered 2023-03-21 – 2023-03-22 (×7): 2 [IU] via SUBCUTANEOUS
  Filled 2023-03-21 (×10): qty 1

## 2023-03-21 MED ORDER — ROCURONIUM BROMIDE 10 MG/ML (PF) SYRINGE
PREFILLED_SYRINGE | INTRAVENOUS | Status: AC
  Filled 2023-03-21: qty 10

## 2023-03-21 MED ORDER — MAGNESIUM HYDROXIDE 400 MG/5ML PO SUSP
30.0000 mL | Freq: Every day | ORAL | Status: DC | PRN
  Administered 2023-03-21 – 2023-03-23 (×2): 30 mL via ORAL
  Filled 2023-03-21 (×2): qty 30

## 2023-03-21 MED ORDER — PROPOFOL 10 MG/ML IV BOLUS
INTRAVENOUS | Status: DC | PRN
  Administered 2023-03-21: 100 mg via INTRAVENOUS

## 2023-03-21 MED ORDER — ASPIRIN 81 MG PO TBEC: 81.0000 mg | DELAYED_RELEASE_TABLET | Freq: Every day | ORAL | Status: DC

## 2023-03-21 MED ORDER — ALUM & MAG HYDROXIDE-SIMETH 200-200-20 MG/5ML PO SUSP: 30.0000 mL | ORAL | Status: DC | PRN

## 2023-03-21 MED ORDER — MIDAZOLAM HCL 2 MG/2ML IJ SOLN
INTRAMUSCULAR | Status: DC | PRN
  Administered 2023-03-21: 2 mg via INTRAVENOUS

## 2023-03-21 MED ORDER — ROCURONIUM BROMIDE 100 MG/10ML IV SOLN
INTRAVENOUS | Status: DC | PRN
  Administered 2023-03-21: 30 mg via INTRAVENOUS

## 2023-03-21 MED ORDER — PHENYLEPHRINE HCL-NACL 20-0.9 MG/250ML-% IV SOLN
INTRAVENOUS | Status: DC | PRN
  Administered 2023-03-21: 50 ug/min via INTRAVENOUS

## 2023-03-21 MED ORDER — HYDROCODONE-ACETAMINOPHEN 5-325 MG PO TABS
1.0000 | ORAL_TABLET | Freq: Four times a day (QID) | ORAL | Status: DC | PRN
  Administered 2023-03-22 – 2023-03-24 (×7): 2 via ORAL
  Administered 2023-03-25 (×2): 1 via ORAL
  Filled 2023-03-21 (×4): qty 2
  Filled 2023-03-21: qty 1
  Filled 2023-03-21 (×2): qty 2
  Filled 2023-03-21 (×2): qty 1
  Filled 2023-03-21 (×3): qty 2

## 2023-03-21 MED ORDER — ACETAMINOPHEN 10 MG/ML IV SOLN
INTRAVENOUS | Status: DC | PRN
  Administered 2023-03-21: 1000 mg via INTRAVENOUS

## 2023-03-21 MED ORDER — KETAMINE HCL 50 MG/5ML IJ SOSY
PREFILLED_SYRINGE | INTRAMUSCULAR | Status: AC
  Filled 2023-03-21: qty 5

## 2023-03-21 MED ORDER — SUGAMMADEX SODIUM 200 MG/2ML IV SOLN
INTRAVENOUS | Status: DC | PRN
  Administered 2023-03-21: 200 mg via INTRAVENOUS

## 2023-03-21 MED ORDER — METOCLOPRAMIDE HCL 5 MG PO TABS: 5.0000 mg | ORAL_TABLET | Freq: Three times a day (TID) | ORAL | Status: DC | PRN

## 2023-03-21 MED ORDER — TRANEXAMIC ACID 1000 MG/10ML IV SOLN
INTRAVENOUS | Status: AC
  Filled 2023-03-21: qty 10

## 2023-03-21 MED ORDER — IRBESARTAN 75 MG PO TABS
37.5000 mg | ORAL_TABLET | Freq: Every day | ORAL | Status: DC
  Administered 2023-03-22: 37.5 mg via ORAL
  Filled 2023-03-21 (×4): qty 0.5

## 2023-03-21 MED ORDER — AMLODIPINE BESYLATE 10 MG PO TABS
10.0000 mg | ORAL_TABLET | Freq: Every day | ORAL | Status: DC
  Administered 2023-03-22 – 2023-03-23 (×2): 10 mg via ORAL
  Filled 2023-03-21 (×3): qty 1

## 2023-03-21 MED ORDER — FENTANYL CITRATE (PF) 100 MCG/2ML IJ SOLN
INTRAMUSCULAR | Status: AC
  Filled 2023-03-21: qty 2

## 2023-03-21 MED ORDER — DEXAMETHASONE SODIUM PHOSPHATE 10 MG/ML IJ SOLN
INTRAMUSCULAR | Status: DC | PRN
  Administered 2023-03-21: 10 mg via INTRAVENOUS

## 2023-03-21 MED ORDER — BISACODYL 10 MG RE SUPP: 10.0000 mg | Freq: Every day | RECTAL | Status: DC | PRN

## 2023-03-21 MED ORDER — 0.9 % SODIUM CHLORIDE (POUR BTL) OPTIME
TOPICAL | Status: DC | PRN
  Administered 2023-03-21: 1000 mL

## 2023-03-21 MED ORDER — SODIUM CHLORIDE 0.9 % IV SOLN: INTRAVENOUS | Status: DC

## 2023-03-21 MED ORDER — SODIUM CHLORIDE 0.9 % IV SOLN: INTRAVENOUS | Status: DC | PRN

## 2023-03-21 MED ORDER — FLEET ENEMA 7-19 GM/118ML RE ENEM: 1.0000 | ENEMA | Freq: Once | RECTAL | Status: DC | PRN

## 2023-03-21 MED ORDER — CEFAZOLIN SODIUM-DEXTROSE 2-4 GM/100ML-% IV SOLN
INTRAVENOUS | Status: AC
  Filled 2023-03-21: qty 100

## 2023-03-21 MED ORDER — MENTHOL 3 MG MT LOZG: 1.0000 | LOZENGE | OROMUCOSAL | Status: DC | PRN

## 2023-03-21 MED ORDER — SUCCINYLCHOLINE CHLORIDE 200 MG/10ML IV SOSY
PREFILLED_SYRINGE | INTRAVENOUS | Status: DC | PRN
  Administered 2023-03-21: 100 mg via INTRAVENOUS

## 2023-03-21 MED ORDER — ONDANSETRON HCL 4 MG/2ML IJ SOLN
INTRAMUSCULAR | Status: DC | PRN
  Administered 2023-03-21: 4 mg via INTRAVENOUS

## 2023-03-21 MED ORDER — ROSUVASTATIN CALCIUM 10 MG PO TABS
40.0000 mg | ORAL_TABLET | Freq: Every day | ORAL | Status: DC
  Administered 2023-03-22 – 2023-03-25 (×4): 40 mg via ORAL
  Filled 2023-03-21 (×4): qty 4

## 2023-03-21 MED ORDER — ONDANSETRON HCL 4 MG/2ML IJ SOLN: 4.0000 mg | Freq: Four times a day (QID) | INTRAMUSCULAR | Status: DC | PRN

## 2023-03-21 MED ORDER — ACETAMINOPHEN 500 MG PO TABS: 500.0000 mg | ORAL_TABLET | Freq: Four times a day (QID) | ORAL | Status: DC | PRN

## 2023-03-21 MED ORDER — DEXAMETHASONE SODIUM PHOSPHATE 10 MG/ML IJ SOLN
INTRAMUSCULAR | Status: AC
  Filled 2023-03-21: qty 1

## 2023-03-21 MED ORDER — ZOLPIDEM TARTRATE 5 MG PO TABS: 5.0000 mg | ORAL_TABLET | Freq: Every evening | ORAL | Status: DC | PRN

## 2023-03-21 MED ORDER — FENTANYL CITRATE (PF) 100 MCG/2ML IJ SOLN
INTRAMUSCULAR | Status: DC | PRN
  Administered 2023-03-21 (×2): 50 ug via INTRAVENOUS

## 2023-03-21 MED ORDER — FERROUS SULFATE 325 (65 FE) MG PO TABS
325.0000 mg | ORAL_TABLET | Freq: Every day | ORAL | Status: DC
  Administered 2023-03-22 – 2023-03-25 (×4): 325 mg via ORAL
  Filled 2023-03-21 (×4): qty 1

## 2023-03-21 MED ORDER — PROPOFOL 10 MG/ML IV BOLUS
INTRAVENOUS | Status: AC
  Filled 2023-03-21: qty 20

## 2023-03-21 MED ORDER — KETAMINE HCL 50 MG/ML IJ SOLN
INTRAMUSCULAR | Status: DC | PRN
  Administered 2023-03-21 (×2): 20 mg via INTRAVENOUS
  Administered 2023-03-21: 10 mg via INTRAVENOUS

## 2023-03-21 MED ORDER — CEFAZOLIN SODIUM-DEXTROSE 2-4 GM/100ML-% IV SOLN
2.0000 g | INTRAVENOUS | Status: AC
  Administered 2023-03-21: 2 g via INTRAVENOUS

## 2023-03-21 MED ORDER — LATANOPROST 0.005 % OP SOLN
1.0000 [drp] | Freq: Every day | OPHTHALMIC | Status: DC
  Administered 2023-03-21 – 2023-03-24 (×4): 1 [drp] via OPHTHALMIC
  Filled 2023-03-21: qty 2.5

## 2023-03-21 MED ORDER — MORPHINE SULFATE (PF) 2 MG/ML IV SOLN
0.5000 mg | INTRAVENOUS | Status: DC | PRN
  Administered 2023-03-21: 0.5 mg via INTRAVENOUS
  Administered 2023-03-22: 1 mg via INTRAVENOUS
  Filled 2023-03-21 (×2): qty 1

## 2023-03-21 MED ORDER — LIDOCAINE HCL (PF) 2 % IJ SOLN
INTRAMUSCULAR | Status: AC
  Filled 2023-03-21: qty 5

## 2023-03-21 MED ORDER — ONDANSETRON HCL 4 MG/2ML IJ SOLN
INTRAMUSCULAR | Status: AC
  Filled 2023-03-21: qty 2

## 2023-03-21 MED ORDER — METFORMIN HCL 850 MG PO TABS
850.0000 mg | ORAL_TABLET | Freq: Two times a day (BID) | ORAL | Status: DC
  Filled 2023-03-21: qty 1

## 2023-03-21 MED ORDER — ICOSAPENT ETHYL 1 G PO CAPS
2.0000 g | ORAL_CAPSULE | Freq: Two times a day (BID) | ORAL | Status: DC
  Administered 2023-03-21 – 2023-03-25 (×8): 2 g via ORAL
  Filled 2023-03-21 (×8): qty 2

## 2023-03-21 MED ORDER — BUPIVACAINE-EPINEPHRINE (PF) 0.5% -1:200000 IJ SOLN
INTRAMUSCULAR | Status: DC | PRN
  Administered 2023-03-21: 30 mL via PERINEURAL

## 2023-03-21 MED ORDER — CLOPIDOGREL BISULFATE 75 MG PO TABS
75.0000 mg | ORAL_TABLET | Freq: Every day | ORAL | Status: DC
  Administered 2023-03-22 – 2023-03-25 (×4): 75 mg via ORAL
  Filled 2023-03-21 (×4): qty 1

## 2023-03-21 MED ORDER — MORPHINE SULFATE (PF) 4 MG/ML IV SOLN: 4.0000 mg | INTRAVENOUS | Status: DC | PRN

## 2023-03-21 MED ORDER — SUCCINYLCHOLINE CHLORIDE 200 MG/10ML IV SOSY
PREFILLED_SYRINGE | INTRAVENOUS | Status: AC
  Filled 2023-03-21: qty 10

## 2023-03-21 MED ORDER — FENTANYL CITRATE (PF) 100 MCG/2ML IJ SOLN: 25.0000 ug | INTRAMUSCULAR | Status: DC | PRN

## 2023-03-21 MED ORDER — SODIUM CHLORIDE 0.9 % IV BOLUS
500.0000 mL | Freq: Once | INTRAVENOUS | Status: AC
  Administered 2023-03-21: 500 mL via INTRAVENOUS

## 2023-03-21 MED ORDER — ADULT MULTIVITAMIN W/MINERALS CH
1.0000 | ORAL_TABLET | Freq: Every day | ORAL | Status: DC
  Administered 2023-03-22 – 2023-03-25 (×4): 1 via ORAL
  Filled 2023-03-21 (×4): qty 1

## 2023-03-21 MED ORDER — GLYCOPYRROLATE 0.2 MG/ML IJ SOLN
INTRAMUSCULAR | Status: DC | PRN
  Administered 2023-03-21: .2 mg via INTRAVENOUS

## 2023-03-21 MED ORDER — MORPHINE SULFATE (PF) 2 MG/ML IV SOLN
1.0000 mg | INTRAVENOUS | Status: DC | PRN
  Administered 2023-03-21: 1 mg via INTRAVENOUS
  Filled 2023-03-21: qty 1

## 2023-03-21 MED ORDER — PHENOL 1.4 % MT LIQD: 1.0000 | OROMUCOSAL | Status: DC | PRN

## 2023-03-21 MED ORDER — GABAPENTIN 400 MG PO CAPS
400.0000 mg | ORAL_CAPSULE | Freq: Two times a day (BID) | ORAL | Status: DC
  Administered 2023-03-21 – 2023-03-25 (×8): 400 mg via ORAL
  Filled 2023-03-21 (×8): qty 1

## 2023-03-21 MED ORDER — MIDAZOLAM HCL 2 MG/2ML IJ SOLN
INTRAMUSCULAR | Status: AC
  Filled 2023-03-21: qty 2

## 2023-03-21 MED ORDER — TRANEXAMIC ACID 1000 MG/10ML IV SOLN
INTRAVENOUS | Status: DC | PRN
  Administered 2023-03-21: 1000 mg via INTRAVENOUS

## 2023-03-21 MED ORDER — CEFAZOLIN SODIUM-DEXTROSE 2-4 GM/100ML-% IV SOLN
2.0000 g | Freq: Three times a day (TID) | INTRAVENOUS | Status: AC
  Administered 2023-03-21 – 2023-03-22 (×3): 2 g via INTRAVENOUS
  Filled 2023-03-21 (×3): qty 100

## 2023-03-21 MED ORDER — ASPIRIN 325 MG PO TBEC
325.0000 mg | DELAYED_RELEASE_TABLET | Freq: Every day | ORAL | Status: DC
Start: 2023-03-22 — End: 2023-03-21

## 2023-03-21 MED ORDER — ASPIRIN 81 MG PO TBEC
81.0000 mg | DELAYED_RELEASE_TABLET | Freq: Every day | ORAL | Status: DC
  Administered 2023-03-22 – 2023-03-25 (×4): 81 mg via ORAL
  Filled 2023-03-21 (×4): qty 1

## 2023-03-21 MED ORDER — SODIUM CHLORIDE 0.9 % IV SOLN: Freq: Once | INTRAVENOUS | Status: AC

## 2023-03-21 MED ORDER — PHENYLEPHRINE HCL (PRESSORS) 10 MG/ML IV SOLN
INTRAVENOUS | Status: DC | PRN
  Administered 2023-03-21 (×2): 160 ug via INTRAVENOUS

## 2023-03-21 SURGICAL SUPPLY — 47 items
APL PRP STRL LF DISP 70% ISPRP (MISCELLANEOUS) ×3
BIT DRILL CANN 16 HIP (BIT) IMPLANT
BIT DRILL CANN STP 6/9 HIP (BIT) IMPLANT
BIT DRILL LONG 4.2 (BIT) IMPLANT
BIT DRILL TAPERED 10 (BIT) IMPLANT
BLADE SURG SZ11 CARB STEEL (BLADE) ×1 IMPLANT
BLADE TFNA HELICAL 95 STRL (Anchor) IMPLANT
BNDG CMPR 5X4 CHSV STRCH STRL (GAUZE/BANDAGES/DRESSINGS) ×2
BNDG COHESIVE 4X5 TAN STRL LF (GAUZE/BANDAGES/DRESSINGS) ×2 IMPLANT
CHLORAPREP W/TINT 26 (MISCELLANEOUS) ×3 IMPLANT
DRAPE INCISE 23X17 STRL (DRAPES) ×1 IMPLANT
DRAPE INCISE IOBAN 23X17 STRL (DRAPES) ×1 IMPLANT
DRSG AQUACEL AG ADV 3.5X10 (GAUZE/BANDAGES/DRESSINGS) ×1 IMPLANT
DRSG AQUACEL AG ADV 3.5X14 (GAUZE/BANDAGES/DRESSINGS) IMPLANT
ELECT REM PT RETURN 9FT ADLT (ELECTROSURGICAL) ×1
ELECTRODE REM PT RTRN 9FT ADLT (ELECTROSURGICAL) ×1 IMPLANT
GAUZE SPONGE 4X4 12PLY STRL (GAUZE/BANDAGES/DRESSINGS) ×2 IMPLANT
GAUZE XEROFORM 1X8 LF (GAUZE/BANDAGES/DRESSINGS) IMPLANT
GLOVE BIO SURGEON STRL SZ8.5 (GLOVE) ×1 IMPLANT
GLOVE INDICATOR 8.0 STRL GRN (GLOVE) ×1 IMPLANT
GLOVE SURG ORTHO 8.5 STRL (GLOVE) ×1 IMPLANT
GOWN STRL REUS W/ TWL LRG LVL3 (GOWN DISPOSABLE) ×2 IMPLANT
GOWN STRL REUS W/TWL LRG LVL3 (GOWN DISPOSABLE) ×2
GOWN STRL REUS W/TWL LRG LVL4 (GOWN DISPOSABLE) ×2 IMPLANT
GUIDEWIRE 3.2X400 (WIRE) IMPLANT
KIT TURNOVER KIT A (KITS) ×2 IMPLANT
MANIFOLD NEPTUNE II (INSTRUMENTS) ×1 IMPLANT
MAT ABSORB  FLUID 56X50 GRAY (MISCELLANEOUS) ×2
MAT ABSORB FLUID 56X50 GRAY (MISCELLANEOUS) ×2 IMPLANT
NAIL TROCH FIX 10X170 130 (Nail) IMPLANT
NDL SPNL 18GX3.5 QUINCKE PK (NEEDLE) ×2 IMPLANT
NEEDLE SPNL 18GX3.5 QUINCKE PK (NEEDLE) ×2 IMPLANT
NS IRRIG 500ML POUR BTL (IV SOLUTION) ×2 IMPLANT
PACK HIP COMPR (MISCELLANEOUS) ×2 IMPLANT
SCREW LOCKING 5.0X38MM (Screw) IMPLANT
SOL PREP PVP 2OZ (MISCELLANEOUS) ×2
SOLUTION PREP PVP 2OZ (MISCELLANEOUS) ×2 IMPLANT
STAPLER SKIN PROX 35W (STAPLE) ×1 IMPLANT
SUCTION FRAZIER HANDLE 10FR (MISCELLANEOUS) ×1
SUCTION TUBE FRAZIER 10FR DISP (MISCELLANEOUS) ×1 IMPLANT
SUT ETHILON 3 0 FSLX (SUTURE) ×1 IMPLANT
SUT VIC AB 0 CT1 36 (SUTURE) ×1 IMPLANT
SUT VIC AB 2-0 CT1 27 (SUTURE) ×1
SUT VIC AB 2-0 CT1 TAPERPNT 27 (SUTURE) ×1 IMPLANT
SYR 30ML LL (SYRINGE) ×2 IMPLANT
TRAP FLUID SMOKE EVACUATOR (MISCELLANEOUS) ×1 IMPLANT
WATER STERILE IRR 500ML POUR (IV SOLUTION) ×2 IMPLANT

## 2023-03-21 NOTE — Plan of Care (Signed)

## 2023-03-21 NOTE — ED Provider Notes (Signed)
[facility] Provider Note    Event Date/Time   First MD Initiated Contact with Patient 03/20/23 2319     (approximate)   History   Fall   HPI Jordan Arellano is a 72 y.o. 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  He presents after a fall tonight.  He uses an artificial right lower leg and he was pulled down when he was letting his dog out side.  He landed on his right hip.  He felt immediate pain and has pain with any amount of movement.  He did not strike his head and does not have any headache or neck pain and did not have a loss of consciousness.  He has no shortness of breath or chest pain and is in his usual state of health.  He reports that he was supposed to go to his vascular surgeon, Dr. Gilda Crease, for a clinic appointment tomorrow (4/15) to check on some recent stents that were placed in his left leg.     Physical Exam   Triage Vital Signs: ED Triage Vitals  Enc Vitals Group     BP 03/20/23 2313 119/72     Pulse Rate 03/20/23 2313 98     Resp 03/20/23 2313 15     Temp 03/20/23 2313 98.4 F (36.9 C)     Temp Source 03/20/23 2313 Oral     SpO2 03/20/23 2309 98 %     Weight 03/20/23 2314 70.3 kg (155 lb)     Height 03/20/23 2314 1.854 m ( )     Head Circumference --      Peak Flow --      Pain Score 03/20/23 2313 7     Pain Loc --      Pain Edu? --      Excl. in GC? --     Most recent vital signs: Vitals:   03/20/23 2309 03/20/23 2313  BP:  119/72  Pulse:  98  Resp:  15  Temp:  98.4 F (36.9 C)  SpO2: 98% 96%    General: Awake, no distress but in pain when he moves. CV:  Good peripheral perfusion.  Regular rate and rhythm, normal heart sounds. Resp:  Normal effort. Speaking easily and comfortably, no accessory muscle usage nor intercostal retractions.  Lungs are clear to auscultation. Abd:  No distention.  Other:  No  gross deformities of the right lower leg (other than the prior below-knee amputation), but he has tenderness to manipulation of the proximal femur.  Pelvis is stable.   ED Results / Procedures / Treatments   Labs (all labs ordered are listed, but only abnormal results are displayed) Labs Reviewed  BASIC METABOLIC PANEL - Abnormal; Notable for the following components:      Result Value   Glucose, Bld 189 (*)    BUN 27 (*)    Creatinine, Ser 1.66 (*)    GFR, Estimated 44 (*)    All other components within normal limits  CBC WITH DIFFERENTIAL/PLATELET - Abnormal; Notable for the following components:   WBC 17.2 (*)    RBC 3.90 (*)    Hemoglobin 11.2 (*)    HCT 34.6 (*)    Neutro Abs 14.2 (*)    Abs Immature Granulocytes 0.08 (*)    All other components within normal limits  PROTIME-INR  TYPE AND SCREEN  EKG  ED ECG REPORT I, Loleta Rose, the attending physician, personally viewed and interpreted this ECG.  Date: 03/21/2023 EKG Time: 00: 44 Rate: 84 Rhythm: normal sinus rhythm QRS Axis: normal Intervals: Right bundle branch block and left anterior fascicular block ST/T Wave abnormalities: Non-specific ST segment / T-wave changes, but no clear evidence of acute ischemia. Narrative Interpretation: no definitive evidence of acute ischemia; does not meet STEMI criteria.    RADIOLOGY I viewed and interpreted the patient's hip/pelvis x-rays and can see a nondisplaced fracture of the femoral neck.  Radiologist confirmed.  I also viewed and interpreted the patient's 1 view chest x-ray.  I see no evidence of pneumonia or other acute abnormality.  Radiologist confirmed.   PROCEDURES:  Critical Care performed: No  Procedures    IMPRESSION / MDM / ASSESSMENT AND PLAN / ED COURSE  I reviewed the triage vital signs and the nursing notes.                              Differential diagnosis includes, but is not limited to, hip fracture, pelvic fracture,  dislocation.  Patient's presentation is most consistent with acute presentation with potential threat to life or bodily function.  Labs/studies ordered: Hip/pelvis x-rays of the right side, portable chest x-ray, BMP, type and screen, CBC with differential, pro time-INR.  Interventions/Medications given:  Medications  ondansetron (ZOFRAN) injection 4 mg (has no administration in time range)  0.9 %  sodium chloride infusion ( Intravenous New Bag/Given 03/21/23 0115)  ceFAZolin (ANCEF) IVPB 2g/100 mL premix (has no administration in time range)  HYDROcodone-acetaminophen (NORCO/VICODIN) 5-325 MG per tablet 1 tablet (1 tablet Oral Given 03/21/23 0117)  morphine (PF) 2 MG/ML injection 1 mg (1 mg Intravenous Given 03/21/23 0115)  0.9 %  sodium chloride infusion ( Intravenous New Bag/Given 03/21/23 0046)   (Note:  hospital course my include additional interventions and/or labs/studies not listed above.)   Patient has no other injuries but has a hip fracture (nondisplaced femoral neck) on x-rays as documented above.  Unclear at this point whether this is operative but the patient will have difficulty if it is nonoperative and he has to bear weight particular given his comorbid conditions.  I will consult with Dr. Hyacinth Meeker with the orthopedic service to check with him about recommendations.  Assuming that this will be addressed operatively, he will need admission to the hospital service and associated lab work for preoperative clearance.  Patient and his sister, who is at bedside, understand the plan.     Clinical Course as of 03/21/23 0146  Mon Mar 21, 2023  0023 Consulted by phone with Dr. Hyacinth Meeker with the orthopedics service.  He recommended admission and will take the patient to the operating room tomorrow if he is medically cleared for surgery.  I will do the rest of the medical workup and then consult the hospitalist for admission once the medical workup is finished. [CF]  0120 Labs generally  reassuring send the patient seems to be volume depleted with mild AKI, creatinine of 1.66 which is slightly higher than his baseline.  I ordered a 500 mL normal saline bolus.  Leukocytosis of 17.2.  This may be reactive or secondary to his recent vascular procedure.  He is having no other infectious signs or symptoms.  The patient will get prophylactic antibiotics (Ancef) per orthopedics, so there is no real need to investigate further, for example with a urinalysis.  Consulting  the hospitalist for admission. [CF]  [phone] Consulted with Dr. Para March with the hospitalist service and she will admit [CF]    Clinical Course User Index [CF] Loleta Rose, MD     FINAL CLINICAL IMPRESSION(S) / ED DIAGNOSES   Final diagnoses:  Closed fracture of neck of right femur, initial encounter  Status post below-knee amputation of right lower extremity  Peripheral vascular disease     Rx / DC Orders   ED Discharge Orders     None        Note:  This document was prepared using Dragon voice recognition software and may include unintentional dictation errors.   Loleta Rose, MD 03/21/23 [phone]

## 2023-03-21 NOTE — H&P (Signed)
THE PATIENT WAS SEEN PRIOR TO SURGERY TODAY.  HISTORY, ALLERGIES, HOME MEDICATIONS AND OPERATIVE PROCEDURE WERE REVIEWED. RISKS AND BENEFITS OF SURGERY DISCUSSED WITH PATIENT AGAIN.  NO CHANGES FROM INITIAL HISTORY AND PHYSICAL NOTED.    

## 2023-03-21 NOTE — Plan of Care (Signed)
  Problem: Nutrition: Goal: Adequate nutrition will be maintained Outcome: Progressing   Problem: Elimination: Goal: Will not experience complications related to bowel motility Outcome: Progressing   Problem: Skin Integrity: Goal: Risk for impaired skin integrity will decrease Outcome: Progressing   Problem: Safety: Goal: Ability to remain free from injury will improve Outcome: Progressing   Problem: Pain Managment: Goal: General experience of comfort will improve Outcome: Progressing

## 2023-03-21 NOTE — Assessment & Plan Note (Signed)
Continue aspirin and atorvastatin. ?

## 2023-03-21 NOTE — Assessment & Plan Note (Signed)
Patient is normotensive Continue amlodipine with hydralazine as needed

## 2023-03-21 NOTE — Anesthesia Preprocedure Evaluation (Signed)
Anesthesia Evaluation  Patient identified by MRN, date of birth, ID band Patient awake    Reviewed: Allergy & Precautions, H&P , NPO status , Patient's Chart, lab work & pertinent test results, reviewed documented beta blocker date and time   History of Anesthesia Complications Negative for: history of anesthetic complications  Airway Mallampati: III  TM Distance: >3 FB Neck ROM: full    Dental  (+) Dental Advidsory Given, Edentulous Upper, Partial Lower, Poor Dentition, Missing   Pulmonary neg shortness of breath, neg COPD, neg recent URI, Current Smoker   Pulmonary exam normal breath sounds clear to auscultation       Cardiovascular Exercise Tolerance: Good hypertension, (-) angina + Peripheral Vascular Disease  (-) Past MI and (-) Cardiac Stents Normal cardiovascular exam(-) dysrhythmias (-) Valvular Problems/Murmurs Rhythm:regular Rate:Normal     Neuro/Psych neg Seizures PSYCHIATRIC DISORDERS  Depression    TIACVA, Residual Symptoms    GI/Hepatic negative GI ROS, Neg liver ROS,,,  Endo/Other  diabetes    Renal/GU ARFRenal disease  negative genitourinary   Musculoskeletal   Abdominal   Peds  Hematology negative hematology ROS (+)   Anesthesia Other Findings Past Medical History: 08/11/2022: Basal cell carcinoma     Comment:  R forearm - ED&C No date: Depression 12/12/2016: Foot ulcer No date: Hyperlipidemia No date: Hypertension ~2007: PAD (peripheral artery disease)     Comment:  s/p R BKA for non-healing wound 03/2014: Stroke     Comment:  MRI: Acute nonhemorrhagic left paracentral pontine               infarct. Arterial venous malformation left hippocampus               with nidus measuring  12x9,8 mm ; Left vertebral artery               is occluded. 01/2014: TIA (transient ischemic attack)   Reproductive/Obstetrics negative OB ROS                             Anesthesia  Physical Anesthesia Plan  ASA: 3  Anesthesia Plan: General   Post-op Pain Management:    Induction: Intravenous  PONV Risk Score and Plan: 1 and Ondansetron, Dexamethasone and Treatment may vary due to age or medical condition  Airway Management Planned: LMA and Oral ETT  Additional Equipment:   Intra-op Plan:   Post-operative Plan: Extubation in OR  Informed Consent: I have reviewed the patients History and Physical, chart, labs and discussed the procedure including the risks, benefits and alternatives for the proposed anesthesia with the patient or authorized representative who has indicated his/her understanding and acceptance.     Dental Advisory Given  Plan Discussed with: Anesthesiologist, CRNA and Surgeon  Anesthesia Plan Comments:        Anesthesia Quick Evaluation

## 2023-03-21 NOTE — Assessment & Plan Note (Signed)
Continue aspirin and rosuvastatin ?

## 2023-03-21 NOTE — Op Note (Signed)
DATE OF SURGERY:  03/21/2023  TIME: 2:27 PM  PATIENT NAME:  Jordan Arellano  AGE: 72 y.o.  PRE-OPERATIVE DIAGNOSIS:  Right hip fracture, intertrochanteric, nondisplaced  POST-OPERATIVE DIAGNOSIS:  SAME  PROCEDURE:  INTRAMEDULLARY (IM) NAIL INTERTROCHANTERIC right hip  SURGEON:  Valinda Hoar  ASST:  EBL: 50 cc  COMPLICATIONS: None  OPERATIVE IMPLANTS: Synthes trochanteric femoral nail 10 mm / 130 degrees with interlocking helical blade 95 mm and distal locking screw 38 mm.  PREOPERATIVE INDICATIONS:  [name] is a 72 y.o. year old who fell and suffered a hip fracture. He was brought into the ER and then admitted and optimized and then elected for surgical intervention.    The risks benefits and alternatives were discussed with the patient including but not limited to the risks of nonoperative treatment, versus surgical intervention including infection, bleeding, nerve injury, malunion, nonunion, hardware prominence, hardware failure, need for hardware removal, blood clots, cardiopulmonary complications, morbidity, mortality, among others, and they were willing to proceed.    OPERATIVE PROCEDURE:  The patient was brought to the operating room and placed in the supine position.  General endotracheal  anesthesia was administered. He was placed on the fracture table.  The  below-knee amputation stump was fixed to the traction arm with Coban.  Fortunately no traction was necessary.  Time out was then performed after sterile prep and drape. He received preoperative antibiotics.  Incision was made proximal to the greater trochanter. A guidewire was placed in the appropriate position. Confirmation was made on AP and lateral views. The above-named nail was opened. I opened the proximal femur with a reamer. I then placed the nail by hand easily down. I did not need to ream the femur.  Once the nail was completely seated, I placed a guidepin into the femoral head into the center center  position through a second incision.  I measured the length, and then reamed the lateral cortex and up into the head. I then placed the helical blade. Slight compression was applied. Anatomic fixation achieved. Bone quality was mediocre.  I then secured the proximal interlock.  The distal locking screw was then placed and after confirming the position of the fracture fragments and hardware I then removed the instruments, and took final C-arm pictures AP and lateral the entire length of the leg. Anatomic reconstruction was achieved, and the wounds were irrigated copiously and closed with Vicryl  followed by staples and dry sterile dressing. Sponge and needle count were correct.   The patient was awakened and returned to PACU in stable and satisfactory condition. There no complications and the patient tolerated the procedure well.  He will be partial weightbearing as tolerated, and will be on Lovenox  For DVT prophylaxis.     Valinda Hoar, M.D.

## 2023-03-21 NOTE — Assessment & Plan Note (Signed)
Suspect reactive from fracture Chest x-ray nonacute Will get a UA Monitor

## 2023-03-21 NOTE — Consult Note (Signed)
ORTHOPAEDIC CONSULTATION  REQUESTING PHYSICIAN: Gillis Santa, MD  Chief Complaint: Right hip pain  HPI: Jordan Arellano is a 72 y.o. male who complains of right hip pain after a fall at home yesterday.  He tangled up in some cords and landed on the right hip.  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  He uses a prosthesis.  He was brought to the emergency room exam and x-rays show a hairline fracture fracture of the intertrochanteric region of the right hip.  There is no displacement at all.  I discussed the treatment with the patient and his sister.  I recommended surgical fixation of the fracture to prevent this from displacing and become a bigger problem in the future.  The risk and benefits of surgery and postop protocol were discussed with him.  He would like to proceed with surgery.  Will plan on doing a trochanteric fixation nail to prevent any displacement or settling.  Patient takes aspirin and Plavix and his last doses were yesterday.  Past Medical History:  Diagnosis Date   Basal cell carcinoma 08/11/2022   R forearm - ED&C   Depression    Foot ulcer 12/12/2016   Hyperlipidemia    Hypertension    PAD (peripheral artery disease) ~2007   s/p R BKA for non-healing wound   Stroke 03/2014   MRI: Acute nonhemorrhagic left paracentral pontine infarct. 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   TIA (transient ischemic attack) 01/2014   Past Surgical History:  Procedure Laterality Date   BELOW KNEE LEG AMPUTATION Right    FEMORAL-POPLITEAL BYPASS GRAFT Left 12/17/2016   Procedure: BYPASS LEFT FEMORAL TO BELOW POPLITEAL ARTERY USING PROPATEN GORE GRAFT;  Surgeon: Larina Earthly, MD;  Location: [facility] OR;  Service: Vascular;  Laterality: Left;   FEMORAL-POPLITEAL BYPASS GRAFT Left 09/30/2017   Procedure: LEFT LEG ANGIOGRAM,  THROMBECTOMY, FEM-POPLITEAL BYPASS GRAFT,  tHROMBECTOMY PERONEAL ARTERY AND POSTERIOR TIBIAL , ENDARTERECTOMY TIBIAL/PERONEAL TRUNK WITH BOVINE PATCH ANGIOPLASTY.;  Surgeon: Fransisco Hertz, MD;  Location: [facility] OR;  Service: Vascular;  Laterality: Left;   LOWER EXTREMITY ANGIOGRAPHY Left 12/14/2022   Procedure: Lower Extremity Angiography;  Surgeon: Renford Dills, MD;  Location: [facility] INVASIVE CV LAB;  Service: Cardiovascular;  Laterality: Left;   PERIPHERAL VASCULAR CATHETERIZATION Left 12/14/2016   Procedure: Abdominal Aortogram w/Lower Extremity;  Surgeon: Chuck Hint, MD;  Location: [facility] INVASIVE CV LAB;  Service: Cardiovascular;  Laterality: Left;   right cataract extraction     TRANSTHORACIC ECHOCARDIOGRAM  01/2014   To evaluate possible CVA: EF 55-60%. GR 1 DD. No significant valvular lesions   Social History   Socioeconomic History   Marital status: Widowed    Spouse name: Not on file   Number of children: 2   Years of education: Not on file   Highest education level: Not on file  Occupational History    Comment: Disabled  Tobacco Use   Smoking status: Some Days    Packs/day: 15.00    Years: 60.00    Additional pack years: 0.00    Total pack years: 900.00    Types: Cigarettes   Smokeless tobacco: Never  Vaping Use   Vaping Use: Never used  Substance and Sexual Activity   Alcohol use: No   Drug use: No   Sexual activity: Not Currently    Birth control/protection: Abstinence  Other Topics Concern   Not on  file  Social History Narrative   One boy youngest, killed in MVA    Social Determinants of Health   Financial Resource Strain: Low Risk  (10/08/2021)   Overall Financial Resource Strain (CARDIA)    Difficulty of Paying Living Expenses: Not hard at all  Food Insecurity: No Food Insecurity (03/21/2023)   Hunger Vital Sign    Worried About Running Out of Food in the Last Year: Never true    Ran Out of Food in the Last Year: Never true  Transportation Needs: No Transportation Needs (03/21/2023)   PRAPARE  - Administrator, Civil Service (Medical): No    Lack of Transportation (Non-Medical): No  Physical Activity: Insufficiently Active (04/07/2022)   Exercise Vital Sign    Days of Exercise per Week: 2 days    Minutes of Exercise per Session: 30 min  Stress: No Stress Concern Present (04/07/2022)   [employer] of Occupational Health - Occupational Stress Questionnaire    Feeling of Stress : Not at all  Social Connections: Socially Isolated (04/07/2022)   Social Connection and Isolation Panel [NHANES]    Frequency of Communication with Friends and Family: Three times a week    Frequency of Social Gatherings with Friends and Family: Three times a week    Attends Religious Services: Never    Active Member of Clubs or Organizations: No    Attends Banker Meetings: Never    Marital Status: Widowed   Family History  Problem Relation Age of Onset   Hypertension Mother        Does not know history   Heart disease Mother    Stroke Mother    Diabetes Mother    Hypertension Father    Heart disease Father    Stroke Father    Diabetes Sister    Hypertension Sister    Heart disease Brother    Hypertension Brother    No Known Allergies Prior to Admission medications   Medication Sig Start Date End Date Taking? Authorizing Provider  acetaminophen (TYLENOL) 500 MG tablet Take 500 mg by mouth every 6 (six) hours as needed.   Yes [provider]  amLODipine (NORVASC) 10 MG tablet Take 10 mg by mouth daily. 11/24/22  Yes [provider]  aspirin 81 MG tablet Take 81 mg by mouth daily.   Yes [provider]  clopidogrel (PLAVIX) 75 MG tablet Take 1 tablet (75 mg total) by mouth daily. 12/14/22  Yes Schnier, Latina Craver, MD  icosapent Ethyl (VASCEPA) 1 g capsule Take 2 capsules (2 g total) by mouth 2 (two) times daily. 01/27/23 07/26/23 Yes Dugal, Tabitha, FNP  latanoprost (XALATAN) 0.005 % ophthalmic solution Place 1 drop into both eyes at bedtime.  11/12/22  Yes [provider]  metFORMIN (GLUCOPHAGE) 850 MG tablet TAKE 1 TABLET BY MOUTH TWICE  DAILY WITH MEALS 07/07/22  Yes Nche, Bonna Gains, NP  olmesartan (BENICAR) 40 MG tablet TAKE 1 TABLET BY MOUTH DAILY 07/23/22  Yes Nche, Bonna Gains, NP  rosuvastatin (CRESTOR) 40 MG tablet Take 1 tablet (40 mg total) by mouth daily. 01/31/23  Yes Dugal, Wyatt Mage, FNP  sertraline (ZOLOFT) 100 MG tablet Take 1 tablet (100 mg total) by mouth daily. 02/11/22  Yes Nche, Bonna Gains, NP  spironolactone (ALDACTONE) 25 MG tablet Take 1 tablet (25 mg total) by mouth daily. 01/08/22  Yes Nche, Bonna Gains, NP  blood glucose meter kit and supplies KIT Dispense based on patient and insurance preference. Use two  times daily as directed (before breakfast and at bedtime. (FOR ICD-10: E11.51) 02/11/22   Nche, Bonna Gains, NP   DG Chest Port 1 View  Result Date: 03/21/2023 CLINICAL DATA:  Right hip fracture, preop chest radiograph EXAM: PORTABLE CHEST 1 VIEW COMPARISON:  07/21/2022 FINDINGS: Lungs are clear.  No pleural effusion or pneumothorax. The heart is normal in size. IMPRESSION: No acute cardiopulmonary disease. Electronically Signed   By: Charline Bills M.D.   On: 03/21/2023 00:47   DG Hip Unilat  With Pelvis 2-3 Views Right  Result Date: 03/20/2023 CLINICAL DATA:  Trauma, tripped landing on right hip. EXAM: DG HIP (WITH OR WITHOUT PELVIS) 2-3V RIGHT COMPARISON:  None Available. FINDINGS: There is a nondisplaced fracture of the femoral neck. Femoral head is seated in the acetabulum. Mild osteoarthritis of both hips. Bony pelvis and pubic rami are intact. IMPRESSION: Nondisplaced right femoral neck fracture. Electronically Signed   By: Narda Rutherford M.D.   On: 03/20/2023 23:36    Positive ROS: All other systems have been reviewed and were otherwise negative with the exception of those mentioned in the HPI and as above.  Physical Exam: General: Alert, no acute distress Cardiovascular: No pedal  edema Respiratory: No cyanosis, no use of accessory musculature GI: No organomegaly, abdomen is soft and non-tender Skin: No lesions in the area of chief complaint Neurologic: Sensation intact distally Psychiatric: Patient is competent for consent with normal mood and affect Lymphatic: No axillary or cervical lymphadenopathy  MUSCULOSKELETAL: Patient is alert awake and oriented.  He is lying quietly in a hospital bed.  The right leg shows a below-knee amputation with the skin intact.  Alignment is good.  There are some pain with range of motion of the hip.  Left lower extremity is intact and has good motion without pain.  Upper extremities and spine are nontender.  Assessment: Nondisplaced intertrochanteric fracture right hip  Plan: Open reduction internal fixation with trochanteric fixation nail I do not believe the risk of bleeding is significantly increased since fracture is nondisplaced and minimal incisions will be made.    Valinda Hoar, MD [phone]   03/21/2023 12:30 PM

## 2023-03-21 NOTE — Anesthesia Procedure Notes (Signed)
Procedure Name: Intubation Date/Time: 03/21/2023 1:20 PM  Performed by: Mathews Argyle, CRNAPre-anesthesia Checklist: Patient identified, Patient being monitored, Timeout performed, Emergency Drugs available and Suction available Patient Re-evaluated:Patient Re-evaluated prior to induction Oxygen Delivery Method: Circle system utilized Preoxygenation: Pre-oxygenation with 100% oxygen Induction Type: IV induction Ventilation: Mask ventilation without difficulty Laryngoscope Size: McGraph and 4 Grade View: Grade I Tube type: Oral Tube size: 7.5 mm Number of attempts: 1 Airway Equipment and Method: Stylet and Video-laryngoscopy Placement Confirmation: ETT inserted through vocal cords under direct vision, positive ETCO2 and breath sounds checked- equal and bilateral Secured at: 22 cm Tube secured with: Tape Dental Injury: Teeth and Oropharynx as per pre-operative assessment

## 2023-03-21 NOTE — Assessment & Plan Note (Signed)
Accidental fall Pain control N.p.o. Further orders per Ortho Dr. Hyacinth Meeker plans to take to the OR on 4/15 Low to moderate risk of perioperative cardiopulmonary events

## 2023-03-21 NOTE — Plan of Care (Signed)
Patient was seen and examined at bedside, stated no pain at rest but it hurts very bad on the movement.  We will continue current treatment, patient is to be seen by orthopedic surgery for ORIF of right hip fracture.

## 2023-03-21 NOTE — Assessment & Plan Note (Addendum)
Creatinine 1.66, up from baseline of 1.12 Possibly related to BP meds of spironolactone and olmesartan Patient received an IV fluid bolus and was started on IV infusion from the ED Continue to monitor

## 2023-03-21 NOTE — Assessment & Plan Note (Signed)
Sliding scale insulin coverage 

## 2023-03-21 NOTE — Transfer of Care (Signed)
Immediate Anesthesia Transfer of Care Note  Patient: Jordan Arellano  Procedure(s) Performed: INTRAMEDULLARY (IM) NAIL INTERTROCHANTERIC (Right: Hip)  Patient Location: PACU  Anesthesia Type:General  Level of Consciousness: drowsy  Airway & Oxygen Therapy: Patient Spontanous Breathing and Patient connected to face mask oxygen  Post-op Assessment: Report given to RN and Post -op Vital signs reviewed and stable  Post vital signs: Reviewed and stable  Last Vitals:  Vitals Value Taken Time  BP 106/61 03/21/23 1426  Temp    Pulse 71 03/21/23 1429  Resp 13 03/21/23 1429  SpO2 100 % 03/21/23 1429  Vitals shown include unvalidated device data.  Last Pain:  Vitals:   03/21/23 1135  TempSrc: Oral  PainSc: 0-No pain      Patients Stated Pain Goal: 3 (03/21/23 0330)  Complications: No notable events documented.

## 2023-03-21 NOTE — H&P (Signed)
History and Physical    Patient: Jordan Arellano ZOX:[id number] DOB: [birth year] DOA: 03/20/2023 DOS: the patient was seen and examined on 03/21/2023 PCP: Mort Sawyers, FNP  Patient coming from: Home  Chief Complaint:  Chief Complaint  Patient presents with   Fall    HPI: Jordan Arellano is a 72 y.o. male with medical history significant for HTN, DM, PAD s/p right BKA with history of revascularization left leg who presents to the ED with right hip pain following an accidental fall onto the right hip while letting his dog out.  Patient currently uses a prosthetic limb on the right leg.  Patient was previously in his usual state of health.Labs significant for WBC 17,000, hemoglobin 11.2, baseline 12.8, creatinine 1.66 which is up from his baseline of 1.12. 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 Patient was pain controlled, treated with an IV fluid bolus and infusion, The ED provider spoke with orthopedist, Dr. Hyacinth Meeker who plans to take patient to the OR on 4/15. Hospitalist consulted for admission.   Review of Systems: As mentioned in the history of present illness. All other systems reviewed and are negative.  Past Medical History:  Diagnosis Date   Basal cell carcinoma 08/11/2022   R forearm - ED&C   Depression    Foot ulcer 12/12/2016   Hyperlipidemia    Hypertension    PAD (peripheral artery disease) ~2007   s/p R BKA for non-healing wound   Stroke 03/2014   MRI: Acute nonhemorrhagic left paracentral pontine infarct. 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   TIA (transient ischemic attack) 01/2014   Past Surgical History:  Procedure Laterality Date   BELOW KNEE LEG AMPUTATION Right    FEMORAL-POPLITEAL BYPASS GRAFT Left 12/17/2016   Procedure: BYPASS LEFT FEMORAL TO BELOW POPLITEAL ARTERY USING  PROPATEN GORE GRAFT;  Surgeon: Larina Earthly, MD;  Location: [facility] OR;  Service: Vascular;  Laterality: Left;   FEMORAL-POPLITEAL BYPASS GRAFT Left 09/30/2017   Procedure: LEFT LEG ANGIOGRAM,  THROMBECTOMY, FEM-POPLITEAL BYPASS GRAFT, tHROMBECTOMY PERONEAL ARTERY AND POSTERIOR TIBIAL , ENDARTERECTOMY TIBIAL/PERONEAL TRUNK WITH BOVINE PATCH ANGIOPLASTY.;  Surgeon: Fransisco Hertz, MD;  Location: [facility] OR;  Service: Vascular;  Laterality: Left;   LOWER EXTREMITY ANGIOGRAPHY Left 12/14/2022   Procedure: Lower Extremity Angiography;  Surgeon: Renford Dills, MD;  Location: [facility] INVASIVE CV LAB;  Service: Cardiovascular;  Laterality: Left;   PERIPHERAL VASCULAR CATHETERIZATION Left 12/14/2016   Procedure: Abdominal Aortogram w/Lower Extremity;  Surgeon: Chuck Hint, MD;  Location: [facility] INVASIVE CV LAB;  Service: Cardiovascular;  Laterality: Left;   right cataract extraction     TRANSTHORACIC ECHOCARDIOGRAM  01/2014   To evaluate possible CVA: EF 55-60%. GR 1 DD. No significant valvular lesions   Social History:  reports that he has been smoking cigarettes. He has a 900.00 pack-year smoking history. He has never used smokeless tobacco. He reports that he does not drink alcohol and does not use drugs.  No Known Allergies  Family History  Problem Relation Age of Onset   Hypertension Mother        Does not know history   Heart disease Mother    Stroke Mother    Diabetes Mother    Hypertension Father    Heart disease Father    Stroke Father    Diabetes Sister  Hypertension Sister    Heart disease Brother    Hypertension Brother     Prior to Admission medications   Medication Sig Start Date End Date Taking? Authorizing Provider  acetaminophen (TYLENOL) 500 MG tablet Take 500 mg by mouth every 6 (six) hours as needed.    [provider]  aspirin 81 MG tablet Take 81 mg by mouth daily.    [provider]  blood glucose meter kit and supplies KIT Dispense based on patient  and insurance preference. Use two times daily as directed (before breakfast and at bedtime. (FOR ICD-10: E11.51) 02/11/22   Nche, Bonna Gains, NP  clopidogrel (PLAVIX) 75 MG tablet Take 1 tablet (75 mg total) by mouth daily. 12/14/22   Schnier, Latina Craver, MD  icosapent Ethyl (VASCEPA) 1 g capsule Take 2 capsules (2 g total) by mouth 2 (two) times daily. 01/27/23 07/26/23  Mort Sawyers, FNP  metFORMIN (GLUCOPHAGE) 850 MG tablet TAKE 1 TABLET BY MOUTH TWICE  DAILY WITH MEALS 07/07/22   Nche, Bonna Gains, NP  olmesartan (BENICAR) 40 MG tablet TAKE 1 TABLET BY MOUTH DAILY 07/23/22   Nche, Bonna Gains, NP  rosuvastatin (CRESTOR) 40 MG tablet Take 1 tablet (40 mg total) by mouth daily. 01/31/23   Mort Sawyers, FNP  sertraline (ZOLOFT) 100 MG tablet Take 1 tablet (100 mg total) by mouth daily. 02/11/22   Nche, Bonna Gains, NP  spironolactone (ALDACTONE) 25 MG tablet Take 1 tablet (25 mg total) by mouth daily. 01/08/22   Anne Ng, NP    Physical Exam: Vitals:   03/20/23 2309 03/20/23 2313 03/20/23 2314 03/21/23 0120  BP:  119/72  120/72  Pulse:  98    Resp:  15  19  Temp:  98.4 F (36.9 C)    TempSrc:  Oral    SpO2: 98% 96%  96%  Weight:   70.3 kg   Height:   6\' 1"  (1.854 m)    Physical Exam Vitals and nursing note reviewed.  Constitutional:      General: He is not in acute distress. HENT:     Head: Normocephalic and atraumatic.  Cardiovascular:     Rate and Rhythm: Normal rate and regular rhythm.     Heart sounds: Normal heart sounds.  Pulmonary:     Effort: Pulmonary effort is normal.     Breath sounds: Normal breath sounds.  Abdominal:     Palpations: Abdomen is soft.     Tenderness: There is no abdominal tenderness.  Neurological:     Mental Status: Mental status is at baseline.     Labs on Admission: I have personally reviewed following labs and imaging studies  CBC: Recent Labs  Lab 03/21/23 0039  WBC 17.2*  NEUTROABS 14.2*  HGB 11.2*  HCT 34.6*  MCV 88.7   PLT 286   Basic Metabolic Panel: Recent Labs  Lab 03/21/23 0039  NA 138  K 4.6  CL 105  CO2 24  GLUCOSE 189*  BUN 27*  CREATININE 1.66*  CALCIUM 9.1   GFR: Estimated Creatinine Clearance: 41.2 mL/min (A) (by C-G formula based on SCr of 1.66 mg/dL (H)). Liver Function Tests: No results for input(s): "AST", "ALT", "ALKPHOS", "BILITOT", "PROT", "ALBUMIN" in the last 168 hours. No results for input(s): "LIPASE", "AMYLASE" in the last 168 hours. No results for input(s): "AMMONIA" in the last 168 hours. Coagulation Profile: Recent Labs  Lab 03/21/23 0039  INR 1.1   Cardiac Enzymes: No results for input(s): "CKTOTAL", "CKMB", "CKMBINDEX", "  TROPONINI" in the last 168 hours. BNP (last 3 results) No results for input(s): "PROBNP" in the last 8760 hours. HbA1C: No results for input(s): "HGBA1C" in the last 72 hours. CBG: No results for input(s): "GLUCAP" in the last 168 hours. Lipid Profile: No results for input(s): "CHOL", "HDL", "LDLCALC", "TRIG", "CHOLHDL", "LDLDIRECT" in the last 72 hours. Thyroid Function Tests: No results for input(s): "TSH", "T4TOTAL", "FREET4", "T3FREE", "THYROIDAB" in the last 72 hours. Anemia Panel: No results for input(s): "VITAMINB12", "FOLATE", "FERRITIN", "TIBC", "IRON", "RETICCTPCT" in the last 72 hours. Urine analysis:    Component Value Date/Time   COLORURINE YELLOW 06/17/2014 1211   APPEARANCEUR HAZY (A) 06/17/2014 1211   LABSPEC 1.009 06/17/2014 1211   PHURINE 6.5 06/17/2014 1211   GLUCOSEU NEGATIVE 06/17/2014 1211   HGBUR TRACE (A) 06/17/2014 1211   BILIRUBINUR NEGATIVE 06/17/2014 1211   KETONESUR NEGATIVE 06/17/2014 1211   PROTEINUR 30 (A) 06/17/2014 1211   UROBILINOGEN 1.0 06/17/2014 1211   NITRITE NEGATIVE 06/17/2014 1211   LEUKOCYTESUR LARGE (A) 06/17/2014 1211    Radiological Exams on Admission: DG Chest Port 1 View  Result Date: 03/21/2023 CLINICAL DATA:  Right hip fracture, preop chest radiograph EXAM: PORTABLE CHEST 1  VIEW COMPARISON:  07/21/2022 FINDINGS: Lungs are clear.  No pleural effusion or pneumothorax. The heart is normal in size. IMPRESSION: No acute cardiopulmonary disease. Electronically Signed   By: Charline Bills M.D.   On: 03/21/2023 00:47   DG Hip Unilat  With Pelvis 2-3 Views Right  Result Date: 03/20/2023 CLINICAL DATA:  Trauma, tripped landing on right hip. EXAM: DG HIP (WITH OR WITHOUT PELVIS) 2-3V RIGHT COMPARISON:  None Available. FINDINGS: There is a nondisplaced fracture of the femoral neck. Femoral head is seated in the acetabulum. Mild osteoarthritis of both hips. Bony pelvis and pubic rami are intact. IMPRESSION: Nondisplaced right femoral neck fracture. Electronically Signed   By: Narda Rutherford M.D.   On: 03/20/2023 23:36     Data Reviewed: Relevant notes from primary care and specialist visits, past discharge summaries as available in EHR, including Care Everywhere. Prior diagnostic testing as pertinent to current admission diagnoses Updated medications and problem lists for reconciliation ED course, including vitals, labs, imaging, treatment and response to treatment Triage notes, nursing and pharmacy notes and ED provider's notes Notable results as noted in HPI   Assessment and Plan: * Closed right hip fracture, initial encounter Accidental fall Pain control N.p.o. Further orders per Ortho Dr. Hyacinth Meeker plans to take to the OR on 4/15 Low to moderate risk of perioperative cardiopulmonary events  AKI (acute kidney injury) Creatinine 1.66, up from baseline of 1.12 Possibly related to BP meds of spironolactone and olmesartan Patient received an IV fluid bolus and was started on IV infusion from the ED Continue to monitor  Leukocytosis Suspect reactive from fracture Chest x-ray nonacute Will get a UA Monitor  DM (diabetes mellitus) with peripheral vascular complication Sliding scale insulin coverage  PAD s/p right BKA, history revascularization left  leg Continue aspirin and rosuvastatin  Essential hypertension Patient is normotensive Continue amlodipine with hydralazine as needed  History of CVA (cerebrovascular accident) without residual deficits Continue aspirin and atorvastatin        DVT prophylaxis: SCD  Consults: Orthopedics, Dr. Hyacinth Meeker  Advance Care Planning:   Code Status: Prior   Family Communication: none  Disposition Plan: Back to previous home environment  Severity of Illness: The appropriate patient status for this patient is INPATIENT. Inpatient status is judged to be reasonable and  necessary in order to provide the required intensity of service to ensure the patient's safety. The patient's presenting symptoms, physical exam findings, and initial radiographic and laboratory data in the context of their chronic comorbidities is felt to place them at high risk for further clinical deterioration. Furthermore, it is not anticipated that the patient will be medically stable for discharge from the hospital within 2 midnights of admission.   * I certify that at the point of admission it is my clinical judgment that the patient will require inpatient hospital care spanning beyond 2 midnights from the point of admission due to high intensity of service, high risk for further deterioration and high frequency of surveillance required.*  Author: Andris Baumann, MD 03/21/2023 2:19 AM  For on call review [email or web address].

## 2023-03-21 NOTE — Progress Notes (Signed)
Initial Nutrition Assessment  DOCUMENTATION CODES:   Not applicable  INTERVENTION:   -Once diet is advanced:   -Glucerna Shake po TID, each supplement provides 220 kcal and 10 grams of protein  -MVI with minerals daily  NUTRITION DIAGNOSIS:   Increased nutrient needs related to post-op healing as evidenced by estimated needs.  GOAL:   Patient will meet greater than or equal to 90% of their needs  MONITOR:   PO intake, Supplement acceptance, Diet advancement  REASON FOR ASSESSMENT:   Consult Assessment of nutrition requirement/status  ASSESSMENT:   Pt with medical history significant for HTN, DM, PAD s/p right BKA with history of revascularization left leg who presents with right hip pain following an accidental fall onto the right hip while letting his dog out.  Pt admitted with rt hip fracture.    Reviewed I/O's: +902 ml x 24 hours  UOP: 250 ml x 24 hours  Per orthopedics notes, plan for surgery today. Pt currently NPO for procedure.   Pt out of room at time of visit. No family at bedside. RD unable to obtain further nutrition-related history or complete nutrition-focused physical exam at this time.    Pt with increased nutritional needs for wound healing and would benefit from addition of oral nutrition supplements.   Reviewed wt hx; wt has been stable over the past year.   Medications reviewed and include 0.9% sodium chloride infusion @ 75 ml/hr.   Lab Results  Component Value Date   HGBA1C 6.2 01/24/2023   PTA DM medications are 850 mg metformin BID.   Labs reviewed: CBGS: 127-165 (inpatient orders for glycemic control are 0-9 units insulin aspart every 4 hours).    Diet Order:   Diet Order             Diet NPO time specified  Diet effective now                   EDUCATION NEEDS:   No education needs have been identified at this time  Skin:  Skin Assessment: Reviewed RN Assessment  Last BM:  03/20/23  Height:   Ht Readings from Last 1  Encounters:  03/20/23 6\' 1"  (1.854 m)    Weight:   Wt Readings from Last 1 Encounters:  03/20/23 70.3 kg    Ideal Body Weight:  78.2 kg (adjusted for rt BKA)  BMI:  Body mass index is 20.45 kg/m.  Estimated Nutritional Needs:   Kcal:  1900-2100  Protein:  90-105 grams  Fluid:  > 1.9 L    Levada Schilling, RD, LDN, CDCES Registered Dietitian II Certified Diabetes Care and Education Specialist Please refer to [facility] for RD and/or RD on-call/weekend/after hours pager

## 2023-03-22 ENCOUNTER — Encounter: Payer: Self-pay | Admitting: Specialist

## 2023-03-22 ENCOUNTER — Telehealth: Payer: Self-pay | Admitting: Family

## 2023-03-22 DIAGNOSIS — S72001A Fracture of unspecified part of neck of right femur, initial encounter for closed fracture: Secondary | ICD-10-CM | POA: Diagnosis not present

## 2023-03-22 LAB — CBC
HCT: 28.1 % — ABNORMAL LOW (ref 39.0–52.0)
Hemoglobin: 9 g/dL — ABNORMAL LOW (ref 13.0–17.0)
MCH: 28.5 pg (ref 26.0–34.0)
MCHC: 32 g/dL (ref 30.0–36.0)
MCV: 88.9 fL (ref 80.0–100.0)
Platelets: 232 10*3/uL (ref 150–400)
RBC: 3.16 MIL/uL — ABNORMAL LOW (ref 4.22–5.81)
RDW: 14 % (ref 11.5–15.5)
WBC: 14.7 10*3/uL — ABNORMAL HIGH (ref 4.0–10.5)
nRBC: 0 % (ref 0.0–0.2)

## 2023-03-22 LAB — URINALYSIS, W/ REFLEX TO CULTURE (INFECTION SUSPECTED)
Bacteria, UA: NONE SEEN
Bilirubin Urine: NEGATIVE
Glucose, UA: NEGATIVE mg/dL
Ketones, ur: NEGATIVE mg/dL
Nitrite: NEGATIVE
Protein, ur: 30 mg/dL — AB
Specific Gravity, Urine: 1.021 (ref 1.005–1.030)
WBC, UA: >50 WBC/hpf (ref 0–5)
pH: 5 (ref 5.0–8.0)

## 2023-03-22 LAB — GLUCOSE, CAPILLARY
Glucose-Capillary: 167 mg/dL — ABNORMAL HIGH (ref 70–99)
Glucose-Capillary: 154 mg/dL — ABNORMAL HIGH (ref 70–99)
Glucose-Capillary: 223 mg/dL — ABNORMAL HIGH (ref 70–99)
Glucose-Capillary: 183 mg/dL — ABNORMAL HIGH (ref 70–99)
Glucose-Capillary: 199 mg/dL — ABNORMAL HIGH (ref 70–99)
Glucose-Capillary: 194 mg/dL — ABNORMAL HIGH (ref 70–99)

## 2023-03-22 LAB — BASIC METABOLIC PANEL
Anion gap: 8 (ref 5–15)
BUN: 23 mg/dL (ref 8–23)
CO2: 24 mmol/L (ref 22–32)
Calcium: 8.1 mg/dL — ABNORMAL LOW (ref 8.9–10.3)
Chloride: 105 mmol/L (ref 98–111)
Creatinine, Ser: 1.21 mg/dL (ref 0.61–1.24)
GFR, Estimated: >60 mL/min (ref >60)
Glucose, Bld: 151 mg/dL — ABNORMAL HIGH (ref 70–99)
Potassium: 4 mmol/L (ref 3.5–5.1)
Sodium: 137 mmol/L (ref 135–145)

## 2023-03-22 LAB — PHOSPHORUS: Phosphorus: 2.8 mg/dL (ref 2.5–4.6)

## 2023-03-22 LAB — MAGNESIUM: Magnesium: 2.1 mg/dL (ref 1.7–2.4)

## 2023-03-22 MED ORDER — INSULIN ASPART 100 UNIT/ML IJ SOLN
0.0000 [IU] | Freq: Three times a day (TID) | INTRAMUSCULAR | Status: DC
  Administered 2023-03-23: 1 [IU] via SUBCUTANEOUS
  Administered 2023-03-23 – 2023-03-24 (×5): 2 [IU] via SUBCUTANEOUS
  Administered 2023-03-24 (×2): 3 [IU] via SUBCUTANEOUS
  Administered 2023-03-25: 5 [IU] via SUBCUTANEOUS
  Administered 2023-03-25: 3 [IU] via SUBCUTANEOUS
  Administered 2023-03-25: 1 [IU] via SUBCUTANEOUS
  Filled 2023-03-22 (×11): qty 1

## 2023-03-22 MED ORDER — ENOXAPARIN SODIUM 40 MG/0.4ML IJ SOSY
40.0000 mg | PREFILLED_SYRINGE | Freq: Every evening | INTRAMUSCULAR | Status: DC
  Administered 2023-03-22: 40 mg via SUBCUTANEOUS
  Filled 2023-03-22: qty 0.4

## 2023-03-22 NOTE — Evaluation (Signed)
Physical Therapy Evaluation Patient Details Name: Jordan Arellano MRN: [id number] DOB: Nov 25, 1951 Today's Date: 03/22/2023  History of Present Illness  Jordan Arellano is a 72 y.o. male with medical history significant for HTN, DM, PAD s/p right BKA with history of revascularization left leg who presents to the ED with right hip pain following an accidental fall onto the right hip while letting his dog out.  Patient currently uses a prosthetic limb on the right leg. S/p R hip IM nailing 03/21/23.   Clinical Impression  Pt admitted with above diagnosis. Pt currently with functional limitations due to the deficits listed below (see PT Problem List). Pt received upright in bed agreeable to PT services. Reports at baseline he is mod-I with mobility using SPC and his R prosthesis. Sister primarily helps with IADL's but he is independent with ADL's.   To date, pt relies on bed features and increased time to attempt transfer to EOB. Generally pt Is able to mostly mobility to EoB sitting but does rely on modA for anterior scoot for LLE to reach floor to maintain fair sitting balance. Pt educated on current WB status on RLE and reviewed LE therex and flexibility to maintain R knee joint for eventual return to ambulation with prosthetic after cleared by orthopedics to return to WB. Pt understanding relying on maxA for STS to RW. Posterior bias noted in standing with por anterior trunk lean with standing transfer despite max VC's. MinA grossly needed for pivot transfer to recliner mainly for RW sequencing. Pt fatigued with transfer efforts thus deferred hop to gait today. Pt performing LE therex as listed below. All needs in reach with R BKA placed in extension. Pt well below baseline level of mobility due to acute hip fracture and NWB status. Per pt he has excellent family support. Pt to recommend intensive PT at discharge to maximize capacity to return to PLOF prior to return to home setting.      Recommendations for  follow up therapy are one component of a multi-disciplinary discharge planning process, led by the attending physician.  Recommendations may be updated based on patient status, additional functional criteria and insurance authorization.  Assistance Recommended at Discharge Intermittent Supervision/Assistance  Patient can return home with the following  A lot of help with walking and/or transfers;A lot of help with bathing/dressing/bathroom;Help with stairs or ramp for entrance    Equipment Recommendations Other (comment) (TBD by next venue of care)  Recommendations for Other Services       Functional Status Assessment Patient has had a recent decline in their functional status and demonstrates the ability to make significant improvements in function in a reasonable and predictable amount of time.     Precautions / Restrictions Precautions Precautions: Fall Precaution Comments: history of previous R BKA Restrictions Weight Bearing Restrictions: Yes RLE Weight Bearing: Non weight bearing      Mobility  Bed Mobility Overal bed mobility: Needs Assistance Bed Mobility: Supine to Sit     Supine to sit: Mod assist, HOB elevated     General bed mobility comments: able to generally transfer to upright sitting at supervision but needs modA at chuck pad for LLE to reach floor. Patient Response: Cooperative  Transfers Overall transfer level: Needs assistance Equipment used: Rolling walker (2 wheels) Transfers: Sit to/from Stand, Bed to chair/wheelchair/BSC Sit to Stand: Max assist   Step pivot transfers: Min assist       General transfer comment: on RW to sequence    Ambulation/Gait  General Gait Details: deferred due to weakness and difficulty with transfer  Stairs            Wheelchair Mobility    Modified Rankin (Stroke Patients Only)       Balance Overall balance assessment: Needs assistance Sitting-balance support: Bilateral upper  extremity supported, Feet supported Sitting balance-Leahy Scale: Fair     Standing balance support: Bilateral upper extremity supported, During functional activity, Reliant on assistive device for balance Standing balance-Leahy Scale: Poor Standing balance comment: mild posterior bias needing consistent CGA                             Pertinent Vitals/Pain Pain Assessment Pain Assessment: Faces Faces Pain Scale: Hurts a little bit Pain Descriptors / Indicators: Aching, Grimacing Pain Intervention(s): Limited activity within patient's tolerance, Monitored during session, Repositioned    Home Living Family/patient expects to be discharged to:: Private residence Living Arrangements: Alone Available Help at Discharge: Family;Available PRN/intermittently (sister(s)) Type of Home: House Home Access: Stairs to enter Entrance Stairs-Rails: Doctor, general practice of Steps: 5   Home Layout: One level Home Equipment: Cane - single point      Prior Function Prior Level of Function : Independent/Modified Independent;Needs assist       Physical Assist : ADLs (physical)   ADLs (physical): IADLs Mobility Comments: mod-I with SPC and R prosthetic ADLs Comments: Sister for transportation and receives meals on wheels     Hand Dominance        Extremity/Trunk Assessment   Upper Extremity Assessment Upper Extremity Assessment: Defer to OT evaluation    Lower Extremity Assessment Lower Extremity Assessment: RLE deficits/detail RLE Deficits / Details: R BKA    Cervical / Trunk Assessment Cervical / Trunk Assessment: Normal  Communication   Communication: No difficulties;HOH  Cognition Arousal/Alertness: Awake/alert Behavior During Therapy: WFL for tasks assessed/performed Overall Cognitive Status: Within Functional Limits for tasks assessed                                          General Comments      Exercises General Exercises -  Lower Extremity Quad Sets: AROM, Right, Strengthening, 10 reps, Supine Long Arc Quad: AROM, Strengthening, Right, 10 reps, Seated Heel Slides: AROM, Seated, Right, 10 reps Hip ABduction/ADduction: AROM, Strengthening, Right, Seated, 10 reps Straight Leg Raises: AROM, Strengthening, Right, Seated, 10 reps Other Exercises Other Exercises: Role of PT in acute setting, d/c recs, WB precautions, LE therex.   Assessment/Plan    PT Assessment Patient needs continued PT services  PT Problem List Decreased strength;Pain;Decreased activity tolerance;Decreased balance;Decreased mobility       PT Treatment Interventions DME instruction;Therapeutic exercise;Gait training;Balance training;Neuromuscular re-education;Functional mobility training;Therapeutic activities;Patient/family education;Stair training    PT Goals (Current goals can be found in the Care Plan section)  Acute Rehab PT Goals Patient Stated Goal: improve strength and mobility PT Goal Formulation: With patient Time For Goal Achievement: 04/05/23 Potential to Achieve Goals: Good    Frequency 7X/week     Co-evaluation               AM-PAC PT "6 Clicks" Mobility  Outcome Measure Help needed turning from your back to your side while in a flat bed without using bedrails?: A Lot Help needed moving from lying on your back to sitting on the side of a flat bed  without using bedrails?: A Lot Help needed moving to and from a bed to a chair (including a wheelchair)?: A Little Help needed standing up from a chair using your arms (e.g., wheelchair or bedside chair)?: A Lot Help needed to walk in hospital room?: Total Help needed climbing 3-5 steps with a railing? : Total 6 Click Score: 11    End of Session Equipment Utilized During Treatment: Gait belt Activity Tolerance: Patient tolerated treatment well Patient left: in chair;with call bell/phone within reach;with chair alarm set Nurse Communication: Mobility status PT Visit  Diagnosis: Other abnormalities of gait and mobility (R26.89);Muscle weakness (generalized) (M62.81);History of falling (Z91.81);Difficulty in walking, not elsewhere classified (R26.2);Pain Pain - Right/Left: Right Pain - part of body: Hip    Time: 1010-1037 PT Time Calculation (min) (ACUTE ONLY): 27 min   Charges:   PT Evaluation $PT Eval Moderate Complexity: 1 Mod PT Treatments $Therapeutic Exercise: 8-22 mins       Contrell Ballentine M. Fairly IV, PT, DPT Physical Therapist- [place]  [facility]  03/22/2023, 12:20 PM

## 2023-03-22 NOTE — Telephone Encounter (Signed)
Patient's sister contacted the office, is requesting a call back regarding her brother and would like to discuss prosthesis. Please advise [phone]

## 2023-03-22 NOTE — Plan of Care (Signed)
  Problem: Education: Goal: Knowledge of General Education information will improve Description Including pain rating scale, medication(s)/side effects and non-pharmacologic comfort measures Outcome: Progressing   Problem: Health Behavior/Discharge Planning: Goal: Ability to manage health-related needs will improve Outcome: Progressing   Problem: Clinical Measurements: Goal: Ability to maintain clinical measurements within normal limits will improve Outcome: Progressing Goal: Will remain free from infection Outcome: Progressing Goal: Diagnostic test results will improve Outcome: Progressing Goal: Respiratory complications will improve Outcome: Progressing Goal: Cardiovascular complication will be avoided Outcome: Progressing   Problem: Activity: Goal: Risk for activity intolerance will decrease Outcome: Progressing   Problem: Nutrition: Goal: Adequate nutrition will be maintained Outcome: Progressing   Problem: Coping: Goal: Level of anxiety will decrease Outcome: Progressing   Problem: Elimination: Goal: Will not experience complications related to bowel motility Outcome: Progressing Goal: Will not experience complications related to urinary retention Outcome: Progressing   Problem: Pain Managment: Goal: General experience of comfort will improve Outcome: Progressing   Problem: Safety: Goal: Ability to remain free from injury will improve Outcome: Progressing   Problem: Skin Integrity: Goal: Risk for impaired skin integrity will decrease Outcome: Progressing   Problem: Education: Goal: Verbalization of understanding the information provided (i.e., activity precautions, restrictions, etc) will improve Outcome: Progressing   Problem: Activity: Goal: Ability to ambulate and perform ADLs will improve Outcome: Progressing   Problem: Clinical Measurements: Goal: Postoperative complications will be avoided or minimized Outcome: Progressing   Problem:  Self-Concept: Goal: Ability to maintain and perform role responsibilities to the fullest extent possible will improve Outcome: Progressing   Problem: Pain Management: Goal: Pain level will decrease Outcome: Progressing   

## 2023-03-22 NOTE — Progress Notes (Signed)
[facility] Progress Note  Patient: Jordan Arellano    ZOX:[id number]  DOA: 03/20/2023     Date of Service: the patient was seen and examined on 03/22/2023  Chief Complaint  Patient presents with   Fall   Brief hospital course: Jordan Arellano is a 72 y.o. male with medical history significant for HTN, DM, PAD s/p right BKA with history of revascularization left leg who presents to the ED with right hip pain following an accidental fall onto the right hip while letting his dog out.  Patient currently uses a prosthetic limb on the right leg.  Patient was previously in his usual state of health.Labs significant for WBC 17,000, hemoglobin 11.2, baseline 12.8, creatinine 1.66 which is up from his baseline of 1.12. 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 Patient was pain controlled, treated with an IV fluid bolus and infusion, The ED provider spoke with orthopedist, Dr. Hyacinth Meeker who plans to take patient to the OR on 4/15. Hospitalist consulted for admission.   Assessment and Plan: # Closed right hip fracture, due to Accidental fall Right hip fracture, intertrochanteric, nondisplaced  Continue as needed medication for pain control Orthopedic consulted, s/p right ORIF done on 4/15 To follow PT and OT eval for possible placement   AKI (acute kidney injury) Creatinine 1.66, up from baseline of 1.12 Possibly related to BP meds of spironolactone and olmesartan Patient received an IV fluid bolus and was started on IV infusion from the ED,  Cr 1.21 improved  Continue to monitor    Leukocytosis Suspect reactive from fracture Chest x-ray nonacute WBC count trending down, continue to monitor Follow UA    DM (diabetes mellitus) with peripheral vascular complication Sliding scale insulin coverage   PAD s/p right BKA, history revascularization left leg Continue aspirin and  rosuvastatin Resumed Plavix  Essential hypertension Continue amlodipine with hydralazine as needed Continue spironolactone and irbesartan Monitor BP and titrate medications accordingly  History of CVA (cerebrovascular accident) without residual deficits Continue aspirin, Plavix and atorvastatin    Body mass index is 20.45 kg/m.  Nutrition Problem: Increased nutrient needs Etiology: post-op healing Interventions: Interventions: MVI, Glucerna shake      Diet: Regular diet DVT Prophylaxis: Subcutaneous Lovenox   Advance goals of care discussion: Full code  Family Communication: family was present at bedside, at the time of interview.  The pt provided permission to discuss medical plan with the family. Opportunity was given to ask question and all questions were answered satisfactorily.   Disposition:  Pt is from Home, admitted with Fall and right hip fracture status post ORIF done on 4/15, pending PT/OT eval, which precludes a safe discharge. Discharge to SNF, when bed will be available, pending PT and OT eval.  Subjective: No significant events overnight, patient is complaining of right hip pain 7/10, denies any other active issues.  Physical Exam: General: NAD, lying comfortably Appear in no distress, affect appropriate Eyes: PERRLA ENT: Oral Mucosa Clear, moist  Neck: no JVD,  Cardiovascular: S1 and S2 Present, no Murmur,  Respiratory: good respiratory effort, Bilateral Air entry equal and Decreased, no Crackles, no wheezes Abdomen: Bowel Sound present, Soft and no tenderness,  Skin: no rashes Extremities: no Pedal edema, no calf tenderness, right hip dressing dried blood noticed in the center, otherwise CDI Neurologic: without any new focal findings Gait not checked due to patient safety concerns  Vitals:   03/21/23  2001 03/21/23 2346 03/22/23 0426 03/22/23 0748  BP: 104/61 106/62 118/76 124/68  Pulse: 75 85 79 77  Resp: Temp: 98.6 F (37 C) 98.7  F (37.1 C) 98.6 F (37 C) 98 F (36.7 C)  TempSrc:      SpO2: 97% 96% 98% 97%  Weight:      Height:        Intake/Output Summary (Last 24 hours) at 03/22/2023 1138 Last data filed at 03/22/2023 0948 Gross per 24 hour  Intake 720.52 ml  Output 1000 ml  Net -279.48 ml   Filed Weights   03/20/23 2314  Weight: 70.3 kg    Data Reviewed: I have personally reviewed and interpreted daily labs, tele strips, imagings as discussed above. I reviewed all nursing notes, pharmacy notes, vitals, pertinent old records I have discussed plan of care as described above with RN and patient/family.  CBC: Recent Labs  Lab 03/21/23 0039 03/22/23 0551  WBC 17.2* 14.7*  NEUTROABS 14.2*  --   HGB 11.2* 9.0*  HCT 34.6* 28.1*  MCV 88.7 88.9  PLT 286 232   Basic Metabolic Panel: Recent Labs  Lab 03/21/23 0039 03/22/23 0551  NA 138 137  K 4.6 4.0  CL 105 105  CO2 24 24  GLUCOSE 189* 151*  BUN 27* 23  CREATININE 1.66* 1.21  CALCIUM 9.1 8.1*  MG  --  2.1  PHOS  --  2.8    Studies: DG HIP UNILAT WITH PELVIS 2-3 VIEWS RIGHT  Result Date: 03/21/2023 CLINICAL DATA:  Fracture. EXAM: DG HIP (WITH OR WITHOUT PELVIS) 2-3V RIGHT COMPARISON:  Radiograph yesterday FINDINGS: Four fluoroscopic spot views of the right hip obtained in the operating room. Interval intramedullary nail with distal locking and trans trochanteric screw fixation of proximal femur fracture. Fluoroscopy time 38 seconds. Dose 4.89 mGy. IMPRESSION: Procedural fluoroscopy during proximal femur fracture ORIF. Electronically Signed   By: Narda Rutherford M.D.   On: 03/21/2023 14:46   DG C-Arm 1-60 Min-No Report  Result Date: 03/21/2023 Fluoroscopy was utilized by the requesting physician.  No radiographic interpretation.    Scheduled Meds:  amLODipine  10 mg Oral Daily   aspirin EC  81 mg Oral Daily   clopidogrel  75 mg Oral Daily   feeding supplement (GLUCERNA SHAKE)  237 mL Oral TID BM   ferrous sulfate  325 mg Oral Q  breakfast   gabapentin  400 mg Oral BID   icosapent Ethyl  2 g Oral BID   insulin aspart  0-9 Units Subcutaneous Q4H   irbesartan  37.5 mg Oral Daily   latanoprost  1 drop Both Eyes QHS   multivitamin with minerals  1 tablet Oral Daily   mupirocin ointment  1 Application Nasal BID   rosuvastatin  40 mg Oral Daily   senna  1 tablet Oral BID   sertraline  100 mg Oral Daily   spironolactone  25 mg Oral Daily   Continuous Infusions:  sodium chloride 75 mL/hr at 03/21/23 1710   sodium chloride Stopped (03/21/23 1651)    ceFAZolin (ANCEF) IV 2 g (03/22/23 0430)   PRN Meds: acetaminophen, alum & mag hydroxide-simeth, bisacodyl, HYDROcodone-acetaminophen, magnesium hydroxide, menthol-cetylpyridinium **OR** phenol, metoCLOPramide **OR** metoCLOPramide (REGLAN) injection, morphine injection, ondansetron (ZOFRAN) IV, sodium phosphate, zolpidem  Time spent: 35 minutes  Author: Gillis Santa. MD [facility] 03/22/2023 11:38 AM  To reach On-call, see care teams to locate the attending and reach out to them via [email or web address]. If  7PM-7AM, please contact night-coverage If you still have difficulty reaching the attending provider, please page the [facility] (Director on Call) for [facility] on amion for assistance.

## 2023-03-22 NOTE — Plan of Care (Signed)

## 2023-03-22 NOTE — Progress Notes (Signed)
Inpatient Rehab Admissions Coordinator:   Per therapy recommendations pt was screened for CIR by Estill Dooms, PT, DPT.  Pt does not appear to have the complexity to support a CIR admission with his current payor.  Recommend TOC evaluate other rehab options.   Estill Dooms, PT, DPT Admissions Coordinator [phone] 03/22/23  12:46 PM

## 2023-03-22 NOTE — Evaluation (Signed)
Occupational Therapy Evaluation Patient Details Name: Jordan Arellano MRN: [id number] DOB: 13-Dec-1950 Today's Date: 03/22/2023   History of Present Illness Jordan Arellano is a 72 y.o. male with medical history significant for HTN, DM, PAD s/p right BKA with history of revascularization left leg who presents to the ED with right hip pain following an accidental fall onto the right hip while letting his dog out.  Patient currently uses a prosthetic limb on the right leg. S/p R hip IM nailing 03/21/23.   Clinical Impression   Patient agreeable to OT evaluation. Pt presenting with decreased independence in self care, balance, functional mobility/transfers, and endurance. PTA pt lived alone, was independent for ADLs, received assistance PRN for IADLs (transportation, meals on wheels), and was Mod I for functional mobility using a SPC. Pt currently functioning at set up A for seated grooming tasks and Max A for attempted STS from recliner. Pt endorsed minimal pain in R hip t/o session and was educated on RLE NWB. Pt will benefit from skilled acute OT services to address deficits noted below. OT recommends ongoing therapy upon discharge to maximize safety and independence with ADLs, decrease fall risk, decrease caregiver burden, and promote return to PLOF.       Recommendations for follow up therapy are one component of a multi-disciplinary discharge planning process, led by the attending physician.  Recommendations may be updated based on patient status, additional functional criteria and insurance authorization.   Assistance Recommended at Discharge Frequent or constant Supervision/Assistance  Patient can return home with the following A lot of help with bathing/dressing/bathroom;Two people to help with walking and/or transfers;Assistance with cooking/housework;Assist for transportation;Help with stairs or ramp for entrance    Functional Status Assessment  Patient has had a recent decline in their  functional status and demonstrates the ability to make significant improvements in function in a reasonable and predictable amount of time.  Equipment Recommendations  Other (comment) (defer to next venue of care)    Recommendations for Other Services       Precautions / Restrictions Precautions Precautions: Fall Precaution Comments: history of previous R BKA Restrictions Weight Bearing Restrictions: Yes RLE Weight Bearing: Non weight bearing      Mobility Bed Mobility               General bed mobility comments: NT, pt received/left in recliner    Transfers Overall transfer level: Needs assistance Equipment used: Rolling walker (2 wheels) Transfers: Sit to/from Stand Sit to Stand: Max assist           General transfer comment: VC for hand placement, unable to acheive fully upright standing posture despite Max A      Balance Overall balance assessment: Needs assistance Sitting-balance support: Bilateral upper extremity supported, Feet supported Sitting balance-Leahy Scale: Fair     Standing balance support: Bilateral upper extremity supported, During functional activity, Reliant on assistive device for balance Standing balance-Leahy Scale: Poor                             ADL either performed or assessed with clinical judgement   ADL Overall ADL's : Needs assistance/impaired     Grooming: Set up;Sitting;Wash/dry face                   Toilet Transfer: Rolling walker (2 wheels);Maximal assistance Toilet Transfer Details (indicate cue type and reason): simulated  Vision Patient Visual Report: No change from baseline       Perception     Praxis      Pertinent Vitals/Pain Pain Assessment Pain Assessment: Faces Faces Pain Scale: Hurts a little bit Pain Location: R hip Pain Descriptors / Indicators: Aching, Grimacing Pain Intervention(s): Limited activity within patient's tolerance, Monitored during  session, Repositioned     Hand Dominance Right   Extremity/Trunk Assessment Upper Extremity Assessment Upper Extremity Assessment: Generalized weakness   Lower Extremity Assessment Lower Extremity Assessment: RLE deficits/detail RLE Deficits / Details: R BKA   Cervical / Trunk Assessment Cervical / Trunk Assessment: Normal   Communication Communication Communication: HOH   Cognition Arousal/Alertness: Awake/alert Behavior During Therapy: WFL for tasks assessed/performed Overall Cognitive Status: Within Functional Limits for tasks assessed             General Comments       Exercises Other Exercises Other Exercises: OT provided education re: role of OT, OT POC, post acute recs, sitting up for all meals, EOB/OOB mobility with assistance, home/fall safety, RLE NWB   Shoulder Instructions      Home Living Family/patient expects to be discharged to:: Private residence Living Arrangements: Alone Available Help at Discharge: Family;Available PRN/intermittently (sister(s)) Type of Home: House Home Access: Stairs to enter [employer] of Steps: 5 Entrance Stairs-Rails: Right;Left Home Layout: One level     Bathroom Shower/Tub: Chief Strategy Officer: Standard Bathroom Accessibility: Yes   Home Equipment: Cane - single point          Prior Functioning/Environment Prior Level of Function : Needs assist;History of Falls (last six months)       Physical Assist : ADLs (physical)   ADLs (physical): IADLs Mobility Comments: Mod-I with SPC and R prosthetic ADLs Comments: Pt reports independent for ADLs. Receives assistance for IADLs (Sister provides transportation, receives meals on wheels).        OT Problem List: Decreased strength;Decreased activity tolerance;Impaired balance (sitting and/or standing);Pain;Decreased knowledge of use of DME or AE;Decreased knowledge of precautions      OT Treatment/Interventions: Self-care/ADL  training;Therapeutic exercise;Energy conservation;DME and/or AE instruction;Therapeutic activities;Patient/family education;Balance training    OT Goals(Current goals can be found in the care plan section) Acute Rehab OT Goals Patient Stated Goal: get stronger and return to PLOF OT Goal Formulation: With patient Time For Goal Achievement: 04/05/23 Potential to Achieve Goals: Good   OT Frequency: Min 3X/week    Co-evaluation              AM-PAC OT "6 Clicks" Daily Activity     Outcome Measure Help from another person eating meals?: None Help from another person taking care of personal grooming?: A Little Help from another person toileting, which includes using toliet, bedpan, or urinal?: A Lot Help from another person bathing (including washing, rinsing, drying)?: A Lot Help from another person to put on and taking off regular upper body clothing?: A Little Help from another person to put on and taking off regular lower body clothing?: A Lot 6 Click Score: 16   End of Session Equipment Utilized During Treatment: Gait belt;Rolling walker (2 wheels) Nurse Communication: Mobility status;Precautions;Weight bearing status  Activity Tolerance: Patient tolerated treatment well;Patient limited by fatigue Patient left: in chair;with call bell/phone within reach;with chair alarm set  OT Visit Diagnosis: Other abnormalities of gait and mobility (R26.89);Muscle weakness (generalized) (M62.81);History of falling (Z91.81);Pain Pain - Right/Left: Right Pain - part of body: Hip  Time: 1610-9604 OT Time Calculation (min): 13 min Charges:  OT General Charges $OT Visit: 1 Visit OT Evaluation $OT Eval Moderate Complexity: 1 Mod  [facility] MS, OTR/L ascom [phone]  03/22/23, 5:30 PM

## 2023-03-23 ENCOUNTER — Telehealth: Payer: Self-pay | Admitting: Family

## 2023-03-23 DIAGNOSIS — S72001A Fracture of unspecified part of neck of right femur, initial encounter for closed fracture: Secondary | ICD-10-CM | POA: Diagnosis not present

## 2023-03-23 DIAGNOSIS — Z89511 Acquired absence of right leg below knee: Secondary | ICD-10-CM

## 2023-03-23 LAB — BASIC METABOLIC PANEL
Anion gap: 5 (ref 5–15)
BUN: 31 mg/dL — ABNORMAL HIGH (ref 8–23)
CO2: 24 mmol/L (ref 22–32)
Calcium: 8.2 mg/dL — ABNORMAL LOW (ref 8.9–10.3)
Chloride: 106 mmol/L (ref 98–111)
Creatinine, Ser: 1.15 mg/dL (ref 0.61–1.24)
GFR, Estimated: >60 mL/min (ref >60)
Glucose, Bld: 169 mg/dL — ABNORMAL HIGH (ref 70–99)
Potassium: 4.3 mmol/L (ref 3.5–5.1)
Sodium: 135 mmol/L (ref 135–145)

## 2023-03-23 LAB — MAGNESIUM: Magnesium: 2.1 mg/dL (ref 1.7–2.4)

## 2023-03-23 LAB — FOLATE: Folate: 6.9 ng/mL (ref >5.9)

## 2023-03-23 LAB — URINE CULTURE: Culture: NO GROWTH

## 2023-03-23 LAB — IRON AND TIBC
Iron: 14 ug/dL — ABNORMAL LOW (ref 45–182)
Saturation Ratios: 7 % — ABNORMAL LOW (ref 17.9–39.5)
TIBC: 217 ug/dL — ABNORMAL LOW (ref 250–450)
UIBC: 203 ug/dL

## 2023-03-23 LAB — CBC
HCT: 25.6 % — ABNORMAL LOW (ref 39.0–52.0)
Hemoglobin: 8.2 g/dL — ABNORMAL LOW (ref 13.0–17.0)
MCH: 28.6 pg (ref 26.0–34.0)
MCHC: 32 g/dL (ref 30.0–36.0)
MCV: 89.2 fL (ref 80.0–100.0)
Platelets: 213 10*3/uL (ref 150–400)
RBC: 2.87 MIL/uL — ABNORMAL LOW (ref 4.22–5.81)
RDW: 14.3 % (ref 11.5–15.5)
WBC: 10.7 10*3/uL — ABNORMAL HIGH (ref 4.0–10.5)
nRBC: 0 % (ref 0.0–0.2)

## 2023-03-23 LAB — VITAMIN D 25 HYDROXY (VIT D DEFICIENCY, FRACTURES): Vit D, 25-Hydroxy: 18.89 ng/mL — ABNORMAL LOW (ref 30–100)

## 2023-03-23 LAB — VITAMIN B12: Vitamin B-12: 307 pg/mL (ref 180–914)

## 2023-03-23 LAB — GLUCOSE, CAPILLARY
Glucose-Capillary: 137 mg/dL — ABNORMAL HIGH (ref 70–99)
Glucose-Capillary: 163 mg/dL — ABNORMAL HIGH (ref 70–99)
Glucose-Capillary: 194 mg/dL — ABNORMAL HIGH (ref 70–99)
Glucose-Capillary: 191 mg/dL — ABNORMAL HIGH (ref 70–99)

## 2023-03-23 LAB — PHOSPHORUS: Phosphorus: 3 mg/dL (ref 2.5–4.6)

## 2023-03-23 MED ORDER — ENOXAPARIN SODIUM 40 MG/0.4ML IJ SOSY
40.0000 mg | PREFILLED_SYRINGE | Freq: Every evening | INTRAMUSCULAR | Status: DC
  Administered 2023-03-23: 40 mg via SUBCUTANEOUS
  Filled 2023-03-23: qty 0.4

## 2023-03-23 MED ORDER — FOLIC ACID 1 MG PO TABS
1.0000 mg | ORAL_TABLET | Freq: Every day | ORAL | Status: DC
  Administered 2023-03-23 – 2023-03-25 (×3): 1 mg via ORAL
  Filled 2023-03-23 (×3): qty 1

## 2023-03-23 MED ORDER — SODIUM CHLORIDE 0.9 % IV SOLN
200.0000 mg | INTRAVENOUS | Status: DC
  Administered 2023-03-23 – 2023-03-25 (×3): 200 mg via INTRAVENOUS
  Filled 2023-03-23 (×3): qty 200

## 2023-03-23 NOTE — Progress Notes (Signed)
Subjective: 2 Days Post-Op Procedure(s) (LRB): INTRAMEDULLARY (IM) NAIL INTERTROCHANTERIC (Right) Patient is alert and oriented.  Pain is moderate.  Been out of bed.  Dressing is dry.  Hemoglobin is down to 8.2.  Patient reports pain as moderate.  Objective:   VITALS:   Vitals:   03/23/23 0936 03/23/23 1220  BP: (!) 101/55 108/66  Pulse: 67 67  Resp:    Temp:    SpO2: 95% 99%    Neurologically intact  LABS Recent Labs    03/21/23 0039 03/22/23 0551 03/23/23 0532  HGB 11.2* 9.0* 8.2*  HCT 34.6* 28.1* 25.6*  WBC 17.2* 14.7* 10.7*  PLT 286 232 213    Recent Labs    03/21/23 0039 03/22/23 0551 03/23/23 0532  NA 138 137 135  K 4.6 4.0 4.3  BUN 27* 23 31*  CREATININE 1.66* 1.21 1.15  GLUCOSE 189* 151* 169*    Recent Labs    03/21/23 0039  INR 1.1     Assessment/Plan: 2 Days Post-Op Procedure(s) (LRB): INTRAMEDULLARY (IM) NAIL INTERTROCHANTERIC (Right)   Up with therapy Discharge to SNF as needed. Aspirin and Plavix as preoperatively Return to office in 2 weeks for exam and x-rays.

## 2023-03-23 NOTE — Progress Notes (Signed)
[facility] Progress Note  Patient: Jordan Arellano    ONG:[id number]  DOA: 03/20/2023     Date of Service: the patient was seen and examined on 03/23/2023  Chief Complaint  Patient presents with   Fall   Brief hospital course: Jordan Arellano is a 72 y.o. male with medical history significant for HTN, DM, PAD s/p right BKA with history of revascularization left leg who presents to the ED with right hip pain following an accidental fall onto the right hip while letting his dog out.  Patient currently uses a prosthetic limb on the right leg.  Patient was previously in his usual state of health.Labs significant for WBC 17,000, hemoglobin 11.2, baseline 12.8, creatinine 1.66 which is up from his baseline of 1.12. 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 Patient was pain controlled, treated with an IV fluid bolus and infusion, The ED provider spoke with orthopedist, Dr. Hyacinth Meeker who plans to take patient to the OR on 4/15. Hospitalist consulted for admission.   Assessment and Plan: # Closed right hip fracture, due to Accidental fall Right hip fracture, intertrochanteric, nondisplaced  Continue as needed medication for pain control Orthopedic consulted, s/p right ORIF done on 4/15 Continue aspirin and Plavix as per orthopedic surgery and follow-up in 2 weeks as an outpatient for postop check and x-ray PT and OT eval done, recommend SNF placement.  TOC working for placement    Anemia most likely due to postoperative blood loss Hb 8.2 dropped Monitor H&H and transfuse if hemoglobin less than 7 4/17 Skip Lovenox today   AKI (acute kidney injury) Creatinine 1.66, up from baseline of 1.12 Possibly related to BP meds of spironolactone and olmesartan Patient received an IV fluid bolus and was started on IV infusion from the ED,  Cr 1.15 improved  Continue to monitor     Leukocytosis Suspect reactive from fracture Chest x-ray nonacute WBC count trending down, continue to monitor UA not very impressive for UTI    DM (diabetes mellitus) with peripheral vascular complication Sliding scale insulin coverage   PAD s/p right BKA, history revascularization left leg Continue aspirin and rosuvastatin Resumed Plavix  Essential hypertension Continue amlodipine with hydralazine as needed Continue spironolactone and irbesartan Monitor BP and titrate medications accordingly  History of CVA (cerebrovascular accident) without residual deficits Continue aspirin, Plavix and atorvastatin    Iron deficiency, transferrin saturation 7%, started Venofer 200 mg IV daily during hospital stay followed by oral supplement. Folic acid level 6.9, at lower end, patient will benefit from oral supplement.   Body mass index is 20.45 kg/m.  Nutrition Problem: Increased nutrient needs Etiology: post-op healing Interventions: Interventions: MVI, Glucerna shake      Diet: Regular diet DVT Prophylaxis: Subcutaneous Lovenox   Advance goals of care discussion: Full code  Family Communication: family was present at bedside, at the time of interview.  The pt provided permission to discuss medical plan with the family. Opportunity was given to ask question and all questions were answered satisfactorily.   Disposition:  Pt is from Home, admitted with Fall and right hip fracture status post ORIF done on 4/15, pending PT/OT eval, which precludes a safe discharge. Discharge to SNF, when bed will be available, pending PT and OT eval.  Subjective: No significant events overnight, patient still has pain in the right hip 8/10, no any other active issues, seems to be sitting comfortably on  the recliner.   Physical Exam: General: NAD, lying comfortably Appear in no distress, affect appropriate Eyes: PERRLA ENT: Oral Mucosa Clear, moist  Neck: no JVD,  Cardiovascular: S1 and S2  Present, no Murmur,  Respiratory: good respiratory effort, Bilateral Air entry equal and Decreased, no Crackles, no wheezes Abdomen: Bowel Sound present, Soft and no tenderness,  Skin: no rashes Extremities: no Pedal edema, no calf tenderness, right hip dressing dried blood noticed on the upper part, rest CDI Neurologic: without any new focal findings Gait not checked due to patient safety concerns  Vitals:   03/22/23 2343 03/23/23 0730 03/23/23 0936 03/23/23 1220  BP: 109/63 105/62 (!) 101/55 108/66  Pulse: 76 71 67 67  Resp: 20 16    Temp: 98.1 F (36.7 C) 98.4 F (36.9 C)    TempSrc:      SpO2: 95% 97% 95% 99%  Weight:      Height:        Intake/Output Summary (Last 24 hours) at 03/23/2023 1345 Last data filed at 03/23/2023 0940 Gross per 24 hour  Intake --  Output 450 ml  Net -450 ml   Filed Weights   03/20/23 2314  Weight: 70.3 kg    Data Reviewed: I have personally reviewed and interpreted daily labs, tele strips, imagings as discussed above. I reviewed all nursing notes, pharmacy notes, vitals, pertinent old records I have discussed plan of care as described above with RN and patient/family.  CBC: Recent Labs  Lab 03/21/23 0039 03/22/23 0551 03/23/23 0532  WBC 17.2* 14.7* 10.7*  NEUTROABS 14.2*  --   --   HGB 11.2* 9.0* 8.2*  HCT 34.6* 28.1* 25.6*  MCV 88.7 88.9 89.2  PLT 286 232 213   Basic Metabolic Panel: Recent Labs  Lab 03/21/23 0039 03/22/23 0551 03/23/23 0532  NA 138 137 135  K 4.6 4.0 4.3  CL 105 105 106  CO2 GLUCOSE 189* 151* 169*  BUN 27* 23 31*  CREATININE 1.66* 1.21 1.15  CALCIUM 9.1 8.1* 8.2*  MG  --  2.1 2.1  PHOS  --  2.8 3.0    Studies: No results found.  Scheduled Meds:  amLODipine  10 mg Oral Daily   aspirin EC  81 mg Oral Daily   clopidogrel  75 mg Oral Daily   enoxaparin (LOVENOX) injection  40 mg Subcutaneous QPM   feeding supplement (GLUCERNA SHAKE)  237 mL Oral TID BM   ferrous sulfate  325 mg Oral Q  breakfast   gabapentin  400 mg Oral BID   icosapent Ethyl  2 g Oral BID   insulin aspart  0-9 Units Subcutaneous TID AC & HS   irbesartan  37.5 mg Oral Daily   latanoprost  1 drop Both Eyes QHS   multivitamin with minerals  1 tablet Oral Daily   mupirocin ointment  1 Application Nasal BID   rosuvastatin  40 mg Oral Daily   senna  1 tablet Oral BID   sertraline  100 mg Oral Daily   spironolactone  25 mg Oral Daily   Continuous Infusions:  sodium chloride 75 mL/hr at 03/21/23 1710   PRN Meds: acetaminophen, alum & mag hydroxide-simeth, bisacodyl, HYDROcodone-acetaminophen, magnesium hydroxide, menthol-cetylpyridinium **OR** phenol, metoCLOPramide **OR** metoCLOPramide (REGLAN) injection, morphine injection, ondansetron (ZOFRAN) IV, sodium phosphate, zolpidem  Time spent: 35 minutes  Author: Gillis Santa. MD [facility] 03/23/2023 1:45 PM  To reach On-call, see care teams to locate the attending and reach out to  them via [email or web address]. If 7PM-7AM, please contact night-coverage If you still have difficulty reaching the attending provider, please page the [facility] (Director on Call) for [facility] on amion for assistance.

## 2023-03-23 NOTE — Progress Notes (Signed)
Physical Therapy Treatment Patient Details Name: Jordan Arellano MRN: [id number] DOB: January 11, 1951 Today's Date: 03/23/2023   History of Present Illness Kathleen Likins is a 72 y.o. male with medical history significant for HTN, DM, PAD s/p right BKA with history of revascularization left leg who presents to the ED with right hip pain following an accidental fall onto the right hip while letting his dog out.  Patient currently uses a prosthetic limb on the right leg. S/p R hip IM nailing 03/21/23.    PT Comments    Pt received upright in recliner agreeable to PT. Provided HEP packet with review on reps/sets/frequency performing all exercises as listed below. Improved standing this date being able to use BUE's on arm rests for leverage. Pt performs x3 STS post therex to improve safe transfers and improve LLE strength. Performs at minguard level with frequent VC's to attempt anterior weight shift at trunk prior to standing with limited carryover however pt does have improved, upright posture today and can stand at supervision level. OT entering room on final STS and provides chair follow as pt performs 4' of hop to gait with periods of minA on RW to stabilize AD due to lateral tipping as hopping is effortful to pt. Pt reports needed sitting rest into recliner with modA and very poor eccentric control noted returning to sitting. Reviewed importance of chair push ups for tricep strengthening to assist in improved standing and gait distances with better upper body strength. Pt left in care for OT session. Pt progressing with mobility however is greatly limited in gait distances and has steps pt has to be able to navigate to enter/exit home. This places pt at very high falls risk. Recommend f/u PT recs to address pt's deficits prior to return to home setting.    Recommendations for follow up therapy are one component of a multi-disciplinary discharge planning process, led by the attending physician.  Recommendations  may be updated based on patient status, additional functional criteria and insurance authorization.  Follow Up Recommendations  Can patient physically be transported by private vehicle: No    Assistance Recommended at Discharge Intermittent Supervision/Assistance  Patient can return home with the following A lot of help with walking and/or transfers;A lot of help with bathing/dressing/bathroom;Help with stairs or ramp for entrance   Equipment Recommendations  Other (comment) (TBD)    Recommendations for Other Services       Precautions / Restrictions Precautions Precautions: Fall Precaution Comments: history of previous R BKA Restrictions Weight Bearing Restrictions: Yes RLE Weight Bearing: Non weight bearing     Mobility  Bed Mobility               General bed mobility comments: NT, pt received/left in recliner Patient Response: Cooperative  Transfers Overall transfer level: Needs assistance Equipment used: Rolling walker (2 wheels) Transfers: Sit to/from Stand Sit to Stand: Min guard           General transfer comment: able to utilize recliner arm rests for leverage to stand. Although no physical assist, pt greatly needed increased effort to perform and VC's for anterior trunk lean    Ambulation/Gait Ambulation/Gait assistance: Min guard, Min assist Gait Distance (Feet): 4 Feet Assistive device: Rolling walker (2 wheels)         General Gait Details: hop to pattern with chair follow. Brief periods minA to stabilize [employer] Rankin (  Stroke Patients Only)       Balance Overall balance assessment: Needs assistance Sitting-balance support: Bilateral upper extremity supported, Feet supported Sitting balance-Leahy Scale: Fair     Standing balance support: Bilateral upper extremity supported, During functional activity, Reliant on assistive device for balance Standing balance-Leahy Scale:  Poor Standing balance comment: relies on RW. Improved upright positioning today.                            Cognition Arousal/Alertness: Awake/alert Behavior During Therapy: WFL for tasks assessed/performed Overall Cognitive Status: Within Functional Limits for tasks assessed                                          Exercises General Exercises - Lower Extremity Ankle Circles/Pumps: AROM, Strengthening, Left, 10 reps, Supine Quad Sets: AROM, Right, Strengthening, 10 reps, Supine Gluteal Sets: AROM, Strengthening, Both, 10 reps, Supine Short Arc Quad: AROM, Strengthening, Right, 10 reps, Seated Long Arc Quad: AROM, Strengthening, Right, 10 reps, Seated Heel Slides: AROM, Seated, Right, 10 reps Hip ABduction/ADduction: AROM, Strengthening, Right, Seated, 10 reps Straight Leg Raises: AROM, Strengthening, Right, Seated, 10 reps Hip Flexion/Marching: AROM, Strengthening, Both, 10 reps, Seated    General Comments        Pertinent Vitals/Pain Pain Assessment Pain Assessment: 0-10 Pain Score: 8  Pain Location: R hip Pain Descriptors / Indicators: Aching, Grimacing, Sharp Pain Intervention(s): Limited activity within patient's tolerance, Monitored during session, Repositioned    Home Living                          Prior Function            PT Goals (current goals can now be found in the care plan section) Acute Rehab PT Goals Patient Stated Goal: improve strength and mobility PT Goal Formulation: With patient Time For Goal Achievement: 04/05/23 Potential to Achieve Goals: Good Progress towards PT goals: Progressing toward goals    Frequency    7X/week      PT Plan Discharge plan needs to be updated    Co-evaluation              AM-PAC PT "6 Clicks" Mobility   Outcome Measure  Help needed turning from your back to your side while in a flat bed without using bedrails?: A Lot Help needed moving from lying on your back  to sitting on the side of a flat bed without using bedrails?: A Lot Help needed moving to and from a bed to a chair (including a wheelchair)?: A Little Help needed standing up from a chair using your arms (e.g., wheelchair or bedside chair)?: A Lot Help needed to walk in hospital room?: A Lot Help needed climbing 3-5 steps with a railing? : Total 6 Click Score: 12    End of Session   Activity Tolerance: Patient tolerated treatment well Patient left: in chair;with call bell/phone within reach;with chair alarm set (in care of OT) Nurse Communication: Mobility status PT Visit Diagnosis: Other abnormalities of gait and mobility (R26.89);Muscle weakness (generalized) (M62.81);History of falling (Z91.81);Difficulty in walking, not elsewhere classified (R26.2);Pain Pain - Right/Left: Right Pain - part of body: Hip     Time: 4098-1191 PT Time Calculation (min) (ACUTE ONLY): 23 min  Charges:  $Gait Training: 8-22 mins $Therapeutic Exercise: 8-22 mins  Delphia Grates. Fairly IV, PT, DPT Physical Therapist- [place]  [facility]  03/23/2023, 11:00 AM

## 2023-03-23 NOTE — Anesthesia Postprocedure Evaluation (Signed)
Anesthesia Post Note  Patient: Jordan Arellano  Procedure(s) Performed: INTRAMEDULLARY (IM) NAIL INTERTROCHANTERIC (Right: Hip)  Patient location during evaluation: PACU Anesthesia Type: General Level of consciousness: awake and alert Pain management: pain level controlled Vital Signs Assessment: post-procedure vital signs reviewed and stable Respiratory status: spontaneous breathing, nonlabored ventilation, respiratory function stable and patient connected to nasal cannula oxygen Cardiovascular status: blood pressure returned to baseline and stable Postop Assessment: no apparent nausea or vomiting Anesthetic complications: no   No notable events documented.   Last Vitals:  Vitals:   03/23/23 1220 03/23/23 1512  BP: 108/66 119/69  Pulse: 67 79  Resp:  16  Temp:  (!) 36.3 C  SpO2: 99% 96%    Last Pain:  Vitals:   03/23/23 1453  TempSrc:   PainSc: 7                  Lenard Simmer

## 2023-03-23 NOTE — Progress Notes (Signed)
Nutrition Follow-up  DOCUMENTATION CODES:   Not applicable  INTERVENTION:   -Continue Glucerna Shake po TID, each supplement provides 220 kcal and 10 grams of protein  -Continue MVI with minerals daily  NUTRITION DIAGNOSIS:   Increased nutrient needs related to post-op healing as evidenced by estimated needs.  Ongoing  GOAL:   Patient will meet greater than or equal to 90% of their needs  Progressing   MONITOR:   PO intake, Supplement acceptance, Diet advancement  REASON FOR ASSESSMENT:   Consult Assessment of nutrition requirement/status  ASSESSMENT:   Pt with medical history significant for HTN, DM, PAD s/p right BKA with history of revascularization left leg who presents with right hip pain following an accidental fall onto the right hip while letting his dog out.  4/15- s/p INTRAMEDULLARY (IM) NAIL INTERTROCHANTERIC right hip   Reviewed I/O's: -800 ml x 24 hours and +123 ml since admission  UOP: 800 ml x 24 hours  Spoke with pt, who was sitting in recliner chair at time of visit. Pt reports feeling better and making good progress with therapy. He has not had a BM yet, but typically does not have issues with constipation at home. Senokot added on 03/21/23.   Pt reports he ate most of his lunch and also snacked on food and a peach milkshake from cookout brought in by sister earlier today. He likes the Glucerna supplements and has been drinking them. PTA pt consumes 2-3 meals per day (Breakfast: oatmeal; Lunch: salad; Dinner: meat and vegetable).   Pt denies any weight loss. Reports his UBW is around 150#.   Discussed importance of good meal and supplement intake to promote healing.   Per TOC notes, plan for SNF placement at discharge.   Medications reviewed and include ferrous sulfate, senokot,  and aldactone.   Labs reviewed: CBGS: 137-163 (inpatient orders for glycemic control are 0-9 units insulin aspart TID before meals and at bedtime).    Diet Order:    Diet Order             Diet regular Room service appropriate? Yes; Fluid consistency: Thin  Diet effective now                   EDUCATION NEEDS:   Education needs have been addressed  Skin:  Skin Assessment: Reviewed RN Assessment  Last BM:  03/20/23  Height:   Ht Readings from Last 1 Encounters:  03/20/23  (1.854 m)    Weight:   Wt Readings from Last 1 Encounters:  03/20/23 70.3 kg    Ideal Body Weight:  78.2 kg (adjusted for rt BKA)  BMI:  Body mass index is 20.45 kg/m.  Estimated Nutritional Needs:   Kcal:  1900-2100  Protein:  90-105 grams  Fluid:  > 1.9 L    Levada Schilling, RD, LDN, CDCES Registered Dietitian II Certified Diabetes Care and Education Specialist Please refer to [facility] for RD and/or RD on-call/weekend/after hours pager

## 2023-03-23 NOTE — TOC Progression Note (Signed)
Transition of Care [facility]) - Progression Note    Patient Details  Name: Jordan Arellano MRN: [id number] Date of Birth: 10/07/1951  [facility]) CM/SW Contact  Marlowe Sax, RN Phone Number: 03/23/2023, 2:20 PM  Clinical Narrative:    Met with the patient and he is agreeable to go to STR, I explained I would do a bed search and then present the offers for him to choose, he stated understanding        Expected Discharge Plan and Services                                               Social Determinants of Health (SDOH) Interventions SDOH Screenings   Food Insecurity: No Food Insecurity (03/21/2023)  Housing: Low Risk  (03/21/2023)  Transportation Needs: No Transportation Needs (03/21/2023)  Utilities: Not At Risk (03/21/2023)  Alcohol Screen: Low Risk  (04/07/2022)  Depression (PHQ2-9): Low Risk  (04/07/2022)  Financial Resource Strain: Low Risk  (10/08/2021)  Physical Activity: Insufficiently Active (04/07/2022)  Social Connections: Socially Isolated (04/07/2022)  Stress: No Stress Concern Present (04/07/2022)  Tobacco Use: High Risk (03/22/2023)    Readmission Risk Interventions     No data to display

## 2023-03-23 NOTE — Telephone Encounter (Signed)
Pt's sister, [name]. called asking for a referral to [facility] in [place] for a prosthetic leg for the pt? [name] states the pt is currently in the hosp for taking a fall & breaking his femur. Call back # [phone]

## 2023-03-23 NOTE — Plan of Care (Signed)
  Problem: Coping: Goal: Ability to adjust to condition or change in health will improve Outcome: Progressing   Problem: Skin Integrity: Goal: Risk for impaired skin integrity will decrease Outcome: Progressing   Problem: Nutritional: Goal: Maintenance of adequate nutrition will improve Outcome: Progressing   Problem: Nutrition: Goal: Adequate nutrition will be maintained Outcome: Progressing   Problem: Activity: Goal: Risk for activity intolerance will decrease Outcome: Progressing   Problem: Pain Managment: Goal: General experience of comfort will improve Outcome: Progressing   Problem: Skin Integrity: Goal: Risk for impaired skin integrity will decrease Outcome: Progressing   Problem: Pain Management: Goal: Pain level will decrease Outcome: Progressing

## 2023-03-23 NOTE — Progress Notes (Signed)
Occupational Therapy Treatment Patient Details Name: Jordan Arellano MRN: [id number] DOB: [birth year] Today's Date: 03/23/2023   History of present illness Jordan Arellano is a 72 y.o. male with medical history significant for HTN, DM, PAD s/p right BKA with history of revascularization left leg who presents to the ED with right hip pain following an accidental fall onto the right hip while letting his dog out.  Patient currently uses a prosthetic limb on the right leg. S/p R hip IM nailing 03/21/23.   OT comments  Patient received sitting in recliner with PT present. Tx session targeted improving tolerance for functional mobility in the setting of ADL tasks. Pt able to stand from recliner with Min guard and take several steps in the room with CGA-Min A using RW. Pt then engaged in seated grooming tasks, BUE AROM exercises, and use of incentive spirometer for pulmonary health. Pt left as received with all needs in reach. Pt is making progress toward goal completion. D/C recommendation remains appropriate. OT will continue to follow acutely.    Recommendations for follow up therapy are one component of a multi-disciplinary discharge planning process, led by the attending physician.  Recommendations may be updated based on patient status, additional functional criteria and insurance authorization.    Assistance Recommended at Discharge Frequent or constant Supervision/Assistance  Patient can return home with the following  A lot of help with bathing/dressing/bathroom;Assistance with cooking/housework;Assist for transportation;Help with stairs or ramp for entrance;A lot of help with walking and/or transfers   Equipment Recommendations  Other (comment) (defer to next venue of care)    Recommendations for Other Services      Precautions / Restrictions Precautions Precautions: Fall Precaution Comments: history of previous R BKA Restrictions Weight Bearing Restrictions: Yes RLE Weight Bearing: Non  weight bearing       Mobility Bed Mobility               General bed mobility comments: NT, pt received/left in recliner    Transfers Overall transfer level: Needs assistance Equipment used: Rolling walker (2 wheels) Transfers: Sit to/from Stand Sit to Stand: Min guard           General transfer comment: VC for hand placement and safe technique     Balance Overall balance assessment: Needs assistance Sitting-balance support: Bilateral upper extremity supported, Feet supported Sitting balance-Leahy Scale: Fair     Standing balance support: Bilateral upper extremity supported, During functional activity, Reliant on assistive device for balance Standing balance-Leahy Scale: Poor Standing balance comment: relies on RW. Improved upright positioning today.       ADL either performed or assessed with clinical judgement   ADL Overall ADL's : Needs assistance/impaired     Grooming: Set up;Sitting;Wash/dry face;Oral care                   Toilet Transfer: Min guard;Rolling walker (2 wheels) Toilet Transfer Details (indicate cue type and reason): simulated         Functional mobility during ADLs: Min guard;Minimal assistance;Rolling walker (2 wheels) (pt taking ~4 steps by "hopping" due to being NWB on RLE, chair follow for safety)      Extremity/Trunk Assessment Upper Extremity Assessment Upper Extremity Assessment: Generalized weakness   Lower Extremity Assessment Lower Extremity Assessment: RLE deficits/detail RLE Deficits / Details: R BKA        Vision Patient Visual Report: No change from baseline     Perception     Praxis      Cognition Arousal/Alertness:  Awake/alert Behavior During Therapy: WFL for tasks assessed/performed Overall Cognitive Status: Within Functional Limits for tasks assessed            Exercises General Exercises - Upper Extremity Shoulder Flexion: AROM, Both, 10 reps, Seated, Strengthening Elbow Flexion: AROM,  Both, 10 reps, Seated, Strengthening Elbow Extension: AROM, Both, 10 reps, Seated, Strengthening  Other Exercises Other Exercises: Education provided re: Use of incentive spirometer. Pt verbalized understanding and completed x10. Encouraged pt to complete several times throughout the day when sitting up in bed or chair.      Shoulder Instructions       General Comments      Pertinent Vitals/ Pain       Pain Assessment Pain Assessment: 0-10 Pain Score: 8  Pain Location: R hip Pain Descriptors / Indicators: Aching, Grimacing, Sharp Pain Intervention(s): Limited activity within patient's tolerance, Monitored during session, Repositioned  Home Living        Prior Functioning/Environment              Frequency  Min 3X/week        Progress Toward Goals  OT Goals(current goals can now be found in the care plan section)  Progress towards OT goals: Progressing toward goals  Acute Rehab OT Goals Patient Stated Goal: get stronger and return to PLOF OT Goal Formulation: With patient Time For Goal Achievement: 04/05/23 Potential to Achieve Goals: Good  Plan Discharge plan remains appropriate;Frequency remains appropriate    Co-evaluation                 AM-PAC OT "6 Clicks" Daily Activity     Outcome Measure   Help from another person eating meals?: None Help from another person taking care of personal grooming?: A Little Help from another person toileting, which includes using toliet, bedpan, or urinal?: A Lot Help from another person bathing (including washing, rinsing, drying)?: A Lot Help from another person to put on and taking off regular upper body clothing?: A Little Help from another person to put on and taking off regular lower body clothing?: A Lot 6 Click Score: 16    End of Session Equipment Utilized During Treatment: Gait belt;Rolling walker (2 wheels)  OT Visit Diagnosis: Other abnormalities of gait and mobility (R26.89);Muscle weakness  (generalized) (M62.81);History of falling (Z91.81);Pain Pain - Right/Left: Right Pain - part of body: Hip   Activity Tolerance Patient tolerated treatment well   Patient Left in chair;with call bell/phone within reach;with chair alarm set   Nurse Communication Mobility status        Time: 8295-6213 OT Time Calculation (min): 13 min  Charges: OT General Charges $OT Visit: 1 Visit OT Treatments $Self Care/Home Management : 8-22 mins  [facility] MS, OTR/L ascom [phone]  03/23/23, 12:39 PM

## 2023-03-23 NOTE — NC FL2 (Signed)
[place] MEDICAID FL2 LEVEL OF CARE FORM     IDENTIFICATION  Patient Name: Jordan Arellano Birthdate: Aug 15, 1951 Sex: male Admission Date (Current Location): 03/20/2023  [facility] and [place] Number:  Chiropodist and Address:  [facility], [address] [place], [place] [address]      Provider Number: [id number]  Attending Physician Name and Address:  Gillis Santa, MD  Relative Name and Phone Number:  Gwenlyn Fudge [phone]    Current Level of Care: Hospital Recommended Level of Care: Skilled Nursing Facility Prior Approval Number:    Date Approved/Denied:   PASRR Number: [id number]  Discharge Plan: SNF    Current Diagnoses: Patient Active Problem List   Diagnosis Date Noted   Closed right hip fracture, initial encounter 03/21/2023   AKI (acute kidney injury) 03/21/2023   Accidental fall 03/21/2023   Leukocytosis 03/21/2023   At high risk for infection 01/19/2023   Conductive hearing loss of right ear 08/19/2022   Left renal artery stenosis 06/27/2020   Recurrent major depressive disorder, in full remission 06/27/2020   Atherosclerosis of native arteries of extremity with rest pain 10/01/2017   Status post femoral-popliteal bypass surgery 09/30/2017   DM (diabetes mellitus) with peripheral vascular complication 12/19/2016   Hypertriglyceridemia 10/01/2016   S/P unilateral BKA (below knee amputation), right 09/30/2016   Neuropathy 09/30/2016   PAD s/p right BKA, history revascularization left leg 09/30/2016   Phantom pain after amputation of lower extremity 09/30/2016   History of CVA (cerebrovascular accident) without residual deficits 03/25/2014   Essential hypertension 03/25/2014   Tobacco abuse 03/25/2014    Orientation RESPIRATION BLADDER Height & Weight     Self, Time, Situation, Place  Normal Continent Weight: 70.3 kg Height:  6\' 1"  (185.4 cm)  BEHAVIORAL SYMPTOMS/MOOD NEUROLOGICAL BOWEL NUTRITION STATUS       Continent Diet (See DC summary)  AMBULATORY STATUS COMMUNICATION OF NEEDS Skin   Extensive Assist Verbally Normal, Surgical wounds                       Personal Care Assistance Level of Assistance  Bathing, Feeding, Dressing Bathing Assistance: Limited assistance Feeding assistance: Limited assistance Dressing Assistance: Maximum assistance     Functional Limitations Info  Sight, Hearing, Speech Sight Info: Adequate Hearing Info: Adequate Speech Info: Adequate    SPECIAL CARE FACTORS FREQUENCY  PT (By licensed PT), OT (By licensed OT)     PT Frequency: 5 times per week OT Frequency: 5 times per week            Contractures Contractures Info: Present    Additional Factors Info  Code Status, Allergies Code Status Info: full code Allergies Info: NKDA           Current Medications (03/23/2023):  This is the current hospital active medication list Current Facility-Administered Medications  Medication Dose Route Frequency Provider Last Rate Last Admin   0.45 % sodium chloride infusion   Intravenous Continuous Deeann Saint, MD 75 mL/hr at 03/21/23 1710 New Bag at 03/21/23 1710   acetaminophen (TYLENOL) tablet 500 mg  500 mg Oral Q6H PRN Deeann Saint, MD       alum & mag hydroxide-simeth (MAALOX/MYLANTA) 200-200-20 MG/5ML suspension 30 mL  30 mL Oral Q4H PRN Deeann Saint, MD       amLODipine (NORVASC) tablet 10 mg  10 mg Oral Daily Gillis Santa, MD   10 mg at 03/23/23 0843   aspirin EC tablet 81 mg  81  mg Oral Daily Deeann Saint, MD   81 mg at 03/23/23 0843   bisacodyl (DULCOLAX) suppository 10 mg  10 mg Rectal Daily PRN Deeann Saint, MD       clopidogrel (PLAVIX) tablet 75 mg  75 mg Oral Daily Deeann Saint, MD   75 mg at 03/23/23 0843   enoxaparin (LOVENOX) injection 40 mg  40 mg Subcutaneous QPM Gillis Santa, MD       feeding supplement (GLUCERNA SHAKE) (GLUCERNA SHAKE) liquid 237 mL  237 mL Oral TID BM Deeann Saint, MD   237 mL at 03/23/23 1401    ferrous sulfate tablet 325 mg  325 mg Oral Q breakfast Deeann Saint, MD   325 mg at 03/23/23 6578   folic acid (FOLVITE) tablet 1 mg  1 mg Oral Daily Gillis Santa, MD   1 mg at 03/23/23 1401   gabapentin (NEURONTIN) capsule 400 mg  400 mg Oral BID Deeann Saint, MD   400 mg at 03/23/23 0843   HYDROcodone-acetaminophen (NORCO/VICODIN) 5-325 MG per tablet 1-2 tablet  1-2 tablet Oral Q6H PRN Deeann Saint, MD   2 tablet at 03/23/23 1408   icosapent Ethyl (VASCEPA) 1 g capsule 2 g  2 g Oral BID Deeann Saint, MD   2 g at 03/23/23 1026   insulin aspart (novoLOG) injection 0-9 Units  0-9 Units Subcutaneous TID AC & HS Foust, Katy L, NP   2 Units at 03/23/23 1223   irbesartan (AVAPRO) tablet 37.5 mg  37.5 mg Oral Daily Gillis Santa, MD   37.5 mg at 03/22/23 4696   iron sucrose (VENOFER) 200 mg in sodium chloride 0.9 % 100 mL IVPB  200 mg Intravenous Q24H Gillis Santa, MD       latanoprost (XALATAN) 0.005 % ophthalmic solution 1 drop  1 drop Both Eyes QHS Deeann Saint, MD   1 drop at 03/22/23 2048   magnesium hydroxide (MILK OF MAGNESIA) suspension 30 mL  30 mL Oral Daily PRN Deeann Saint, MD   30 mL at 03/21/23 1715   menthol-cetylpyridinium (CEPACOL) lozenge 3 mg  1 lozenge Oral PRN Deeann Saint, MD       Or   phenol (CHLORASEPTIC) mouth spray 1 spray  1 spray Mouth/Throat PRN Deeann Saint, MD       metoCLOPramide (REGLAN) tablet 5-10 mg  5-10 mg Oral Q8H PRN Deeann Saint, MD       Or   metoCLOPramide (REGLAN) injection 5-10 mg  5-10 mg Intravenous Q8H PRN Deeann Saint, MD       morphine (PF) 2 MG/ML injection 0.5-1 mg  0.5-1 mg Intravenous Q2H PRN Deeann Saint, MD   1 mg at 03/22/23 1249   multivitamin with minerals tablet 1 tablet  1 tablet Oral Daily Deeann Saint, MD   1 tablet at 03/23/23 0843   mupirocin ointment (BACTROBAN) 2 % 1 Application  1 Application Nasal BID Deeann Saint, MD   1 Application at 03/22/23 2045   ondansetron (ZOFRAN) injection 4 mg  4 mg Intravenous  Q6H PRN Deeann Saint, MD       rosuvastatin (CRESTOR) tablet 40 mg  40 mg Oral Daily Deeann Saint, MD   40 mg at 03/23/23 0843   senna (SENOKOT) tablet 8.6 mg  1 tablet Oral BID Deeann Saint, MD   8.6 mg at 03/23/23 0843   sertraline (ZOLOFT) tablet 100 mg  100 mg Oral Daily Deeann Saint, MD   100 mg at 03/23/23 0843   sodium phosphate (FLEET) 7-19 GM/118ML  enema 1 enema  1 enema Rectal Once PRN Deeann Saint, MD       spironolactone (ALDACTONE) tablet 25 mg  25 mg Oral Daily Gillis Santa, MD   25 mg at 03/23/23 0843   zolpidem (AMBIEN) tablet 5 mg  5 mg Oral QHS PRN Deeann Saint, MD         Discharge Medications: Please see discharge summary for a list of discharge medications.  Relevant Imaging Results:  Relevant Lab Results:   Additional Information SS# [id number]  Marlowe Sax, RN

## 2023-03-24 ENCOUNTER — Ambulatory Visit: Payer: Medicare Other | Admitting: Dermatology

## 2023-03-24 DIAGNOSIS — S72001A Fracture of unspecified part of neck of right femur, initial encounter for closed fracture: Secondary | ICD-10-CM | POA: Diagnosis not present

## 2023-03-24 LAB — CBC
HCT: 24.8 % — ABNORMAL LOW (ref 39.0–52.0)
Hemoglobin: 8 g/dL — ABNORMAL LOW (ref 13.0–17.0)
MCH: 28.7 pg (ref 26.0–34.0)
MCHC: 32.3 g/dL (ref 30.0–36.0)
MCV: 88.9 fL (ref 80.0–100.0)
Platelets: 233 10*3/uL (ref 150–400)
RBC: 2.79 MIL/uL — ABNORMAL LOW (ref 4.22–5.81)
RDW: 14 % (ref 11.5–15.5)
WBC: 10.4 10*3/uL (ref 4.0–10.5)
nRBC: 0 % (ref 0.0–0.2)

## 2023-03-24 LAB — BASIC METABOLIC PANEL
Anion gap: 6 (ref 5–15)
BUN: 35 mg/dL — ABNORMAL HIGH (ref 8–23)
CO2: 25 mmol/L (ref 22–32)
Calcium: 8.6 mg/dL — ABNORMAL LOW (ref 8.9–10.3)
Chloride: 106 mmol/L (ref 98–111)
Creatinine, Ser: 1.05 mg/dL (ref 0.61–1.24)
GFR, Estimated: >60 mL/min (ref >60)
Glucose, Bld: 191 mg/dL — ABNORMAL HIGH (ref 70–99)
Potassium: 4.6 mmol/L (ref 3.5–5.1)
Sodium: 137 mmol/L (ref 135–145)

## 2023-03-24 LAB — GLUCOSE, CAPILLARY
Glucose-Capillary: 160 mg/dL — ABNORMAL HIGH (ref 70–99)
Glucose-Capillary: 173 mg/dL — ABNORMAL HIGH (ref 70–99)
Glucose-Capillary: 220 mg/dL — ABNORMAL HIGH (ref 70–99)
Glucose-Capillary: 237 mg/dL — ABNORMAL HIGH (ref 70–99)

## 2023-03-24 LAB — PHOSPHORUS: Phosphorus: 3.6 mg/dL (ref 2.5–4.6)

## 2023-03-24 LAB — MAGNESIUM: Magnesium: 2.5 mg/dL — ABNORMAL HIGH (ref 1.7–2.4)

## 2023-03-24 MED ORDER — CYANOCOBALAMIN 1000 MCG/ML IJ SOLN
1000.0000 ug | Freq: Every day | INTRAMUSCULAR | Status: DC
  Administered 2023-03-24 – 2023-03-25 (×2): 1000 ug via INTRAMUSCULAR
  Filled 2023-03-24 (×2): qty 1

## 2023-03-24 MED ORDER — VITAMIN B-12 1000 MCG PO TABS: 1000.0000 ug | ORAL_TABLET | Freq: Every day | ORAL | Status: DC

## 2023-03-24 MED ORDER — VITAMIN C 500 MG PO TABS
500.0000 mg | ORAL_TABLET | Freq: Every day | ORAL | Status: DC
  Administered 2023-03-24 – 2023-03-25 (×2): 500 mg via ORAL
  Filled 2023-03-24 (×2): qty 1

## 2023-03-24 MED ORDER — VITAMIN D (ERGOCALCIFEROL) 1.25 MG (50000 UNIT) PO CAPS
50000.0000 [IU] | ORAL_CAPSULE | ORAL | Status: DC
  Administered 2023-03-24: 50000 [IU] via ORAL
  Filled 2023-03-24: qty 1

## 2023-03-24 NOTE — Progress Notes (Signed)
Subjective: 3 Days Post-Op Procedure(s) (LRB): INTRAMEDULLARY (IM) NAIL INTERTROCHANTERIC (Right) Patient is out of bed in the chair.  Is eating lunch.  He is comfortable.  A dressing on the right leg shows minimal drainage.  Neurovascular status is good and amputation region.  His hemoglobin is 8.0.  Patient reports pain as mild.  Objective:   VITALS:   Vitals:   03/24/23 1512 03/24/23 1643  BP: 97/62 131/62  Pulse: 73 73  Resp: 18 16  Temp: 97.6 F (36.4 C) 98.2 F (36.8 C)  SpO2: 98% 99%    Neurologically intact Incision: scant drainage  LABS Recent Labs    03/22/23 0551 03/23/23 0532 03/24/23 0506  HGB 9.0* 8.2* 8.0*  HCT 28.1* 25.6* 24.8*  WBC 14.7* 10.7* 10.4  PLT 232 213 233    Recent Labs    03/22/23 0551 03/23/23 0532 03/24/23 0506  NA 137 135 137  K 4.0 4.3 4.6  BUN 23 31* 35*  CREATININE 1.21 1.15 1.05  GLUCOSE 151* 169* 191*    No results for input(s): "LABPT", "INR" in the last 72 hours.   Assessment/Plan: 3 Days Post-Op Procedure(s) (LRB): INTRAMEDULLARY (IM) NAIL INTERTROCHANTERIC (Right)   Up with therapy Discharge to SNF

## 2023-03-24 NOTE — TOC Progression Note (Addendum)
Transition of Care [facility]) - Progression Note    Patient Details  Name: Ayyan Sites MRN: [id number] Date of Birth: Jul 18, 1951  [facility] CM/SW Contact  Marlowe Sax, RN Phone Number: 03/24/2023, 10:40 AM  Clinical Narrative:    Reviewed bed offers and locations with the patient, [facility] was close to his home I explained we would have to get Ins approval, Ins is pending ref number [id number]   Expected Discharge Plan: Skilled Nursing Facility Barriers to Discharge: Insurance Authorization  Expected Discharge Plan and Services                                               Social Determinants of Health (SDOH) Interventions SDOH Screenings   Food Insecurity: No Food Insecurity (03/21/2023)  Housing: Low Risk  (03/21/2023)  Transportation Needs: No Transportation Needs (03/21/2023)  Utilities: Not At Risk (03/21/2023)  Alcohol Screen: Low Risk  (04/07/2022)  Depression (PHQ2-9): Low Risk  (04/07/2022)  Financial Resource Strain: Low Risk  (10/08/2021)  Physical Activity: Insufficiently Active (04/07/2022)  Social Connections: Socially Isolated (04/07/2022)  Stress: No Stress Concern Present (04/07/2022)  Tobacco Use: High Risk (03/22/2023)    Readmission Risk Interventions     No data to display

## 2023-03-24 NOTE — Progress Notes (Signed)
[facility] Progress Note  Patient: Jordan Arellano    ZOX:[id number]  DOA: 03/20/2023     Date of Service: the patient was seen and examined on 03/24/2023  Chief Complaint  Patient presents with   Fall   Brief hospital course: Jordan Arellano is a 72 y.o. male with medical history significant for HTN, DM, PAD s/p right BKA with history of revascularization left leg who presents to the ED with right hip pain following an accidental fall onto the right hip while letting his dog out.  Patient currently uses a prosthetic limb on the right leg.  Patient was previously in his usual state of health.Labs significant for WBC 17,000, hemoglobin 11.2, baseline 12.8, creatinine 1.66 which is up from his baseline of 1.12. 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 Patient was pain controlled, treated with an IV fluid bolus and infusion, The ED provider spoke with orthopedist, Dr. Hyacinth Meeker who plans to take patient to the OR on 4/15. Hospitalist consulted for admission.   Assessment and Plan: # Closed right hip fracture, due to Accidental fall Right hip fracture, intertrochanteric, nondisplaced  Continue as needed medication for pain control Orthopedic consulted, s/p right ORIF done on 4/15 Continue aspirin and Plavix as per orthopedic surgery and follow-up in 2 weeks as an outpatient for postop check and x-ray PT and OT eval done, recommend SNF placement.  TOC working for placement    Anemia most likely due to postoperative blood loss Hb 8.0 dropped Monitor H&H and transfuse if hemoglobin less than 7 4/17 Skipped Lovenox    # AKI (acute kidney injury) Creatinine 1.66, up from baseline of 1.12 Possibly related to BP meds of spironolactone and olmesartan Patient received an IV fluid bolus and was started on IV infusion from the ED,  Cr 1.05 improved  Continue to monitor    #  Leukocytosis, Resolved  Suspect reactive from fracture Chest x-ray nonacute, UA not very impressive for UTI WBC count trended down to NL,  continue to monitor    # DM (diabetes mellitus) with peripheral vascular complication Sliding scale insulin coverage   # PAD s/p right BKA, history revascularization left leg Continue aspirin and rosuvastatin Resumed Plavix  # Essential hypertension Continue amlodipine with hydralazine as needed Continue spironolactone and irbesartan Monitor BP and titrate medications accordingly  # History of CVA (cerebrovascular accident) without residual deficits Continue aspirin, Plavix and atorvastatin    # Iron deficiency, transferrin saturation 7%, started Venofer 200 mg IV daily during hospital stay followed by oral supplement. # Folic acid level 6.9, at lower end, patient will benefit from oral supplement. # Vitamin B12 level 307, goal >400, started vitamin B12 1000 mcg IM injection during hospital stay followed by oral supplement # Vitamin D deficiency: started vitamin D 50,000 units p.o. weekly, follow with PCP to repeat vitamin D level after 3 to 6 months.   Body mass index is 20.45 kg/m.  Nutrition Problem: Increased nutrient needs Etiology: post-op healing Interventions: Interventions: MVI, Glucerna shake      Diet: Regular diet DVT Prophylaxis: Subcutaneous Lovenox   Advance goals of care discussion: Full code  Family Communication: family was present at bedside, at the time of interview.  The pt provided permission to discuss medical plan with the family. Opportunity was given to ask question and all questions were answered satisfactorily.   Disposition:  Pt is from Home, admitted with  Fall and right hip fracture status post ORIF done on 4/15, PT/OT eval done, recommend SNF placement.  Medically stable to discharge to SNF when bed will be available.  TOC is following for placement.   Subjective: No significant events overnight, no  pain at rest but it hurts on movement.  No any other complaints.  Patient was sitting out of bed in the recliner.  Physical Exam: General: NAD, lying comfortably Appear in no distress, affect appropriate Eyes: PERRLA ENT: Oral Mucosa Clear, moist  Neck: no JVD,  Cardiovascular: S1 and S2 Present, no Murmur,  Respiratory: good respiratory effort, Bilateral Air entry equal and Decreased, no Crackles, no wheezes Abdomen: Bowel Sound present, Soft and no tenderness,  Skin: no rashes Extremities: no Pedal edema, no calf tenderness, right hip dressing dried blood noticed on the upper part, rest CDI Neurologic: without any new focal findings Gait not checked due to patient safety concerns  Vitals:   03/23/23 1220 03/23/23 1512 03/23/23 2310 03/24/23 0801  BP: 108/66 119/69 115/79 105/61  Pulse: 67 79 81 72  Resp:  Temp:  (!) 97.4 F (36.3 C) 98.9 F (37.2 C) 98 F (36.7 C)  TempSrc:      SpO2: 99% 96% 95% 96%  Weight:      Height:        Intake/Output Summary (Last 24 hours) at 03/24/2023 1137 Last data filed at 03/24/2023 1028 Gross per 24 hour  Intake 1850.47 ml  Output 700 ml  Net 1150.47 ml   Filed Weights   03/20/23 2314  Weight: 70.3 kg    Data Reviewed: I have personally reviewed and interpreted daily labs, tele strips, imagings as discussed above. I reviewed all nursing notes, pharmacy notes, vitals, pertinent old records I have discussed plan of care as described above with RN and patient/family.  CBC: Recent Labs  Lab 03/21/23 0039 03/22/23 0551 03/23/23 0532 03/24/23 0506  WBC 17.2* 14.7* 10.7* 10.4  NEUTROABS 14.2*  --   --   --   HGB 11.2* 9.0* 8.2* 8.0*  HCT 34.6* 28.1* 25.6* 24.8*  MCV 88.7 88.9 89.2 88.9  PLT 286 232 213 233   Basic Metabolic Panel: Recent Labs  Lab 03/21/23 0039 03/22/23 0551 03/23/23 0532 03/24/23 0506  NA 138 137 135 137  K 4.6 4.0 4.3 4.6  CL 105 105 106 106  CO2 GLUCOSE 189* 151* 169* 191*   BUN 27* 23 31* 35*  CREATININE 1.66* 1.21 1.15 1.05  CALCIUM 9.1 8.1* 8.2* 8.6*  MG  --  2.1 2.1 2.5*  PHOS  --  2.8 3.0 3.6    Studies: No results found.  Scheduled Meds:  amLODipine  10 mg Oral Daily   vitamin C  500 mg Oral Daily   aspirin EC  81 mg Oral Daily   clopidogrel  75 mg Oral Daily   cyanocobalamin  1,000 mcg Intramuscular Q1200   Followed by   Melene Muller ON 03/31/2023] vitamin B-12  1,000 mcg Oral Daily   enoxaparin (LOVENOX) injection  40 mg Subcutaneous QPM   feeding supplement (GLUCERNA SHAKE)  237 mL Oral TID BM   ferrous sulfate  325 mg Oral Q breakfast   folic acid  1 mg Oral Daily   gabapentin  400 mg Oral BID   icosapent Ethyl  2 g Oral BID   insulin aspart  0-9 Units Subcutaneous TID AC & HS   irbesartan  37.5 mg Oral Daily  latanoprost  1 drop Both Eyes QHS   multivitamin with minerals  1 tablet Oral Daily   mupirocin ointment  1 Application Nasal BID   rosuvastatin  40 mg Oral Daily   senna  1 tablet Oral BID   sertraline  100 mg Oral Daily   spironolactone  25 mg Oral Daily   Vitamin D (Ergocalciferol)  50,000 Units Oral Q7 days   Continuous Infusions:  sodium chloride 75 mL/hr at 03/21/23 1710   iron sucrose Stopped (03/23/23 1631)   PRN Meds: acetaminophen, alum & mag hydroxide-simeth, bisacodyl, HYDROcodone-acetaminophen, magnesium hydroxide, menthol-cetylpyridinium **OR** phenol, metoCLOPramide **OR** metoCLOPramide (REGLAN) injection, morphine injection, ondansetron (ZOFRAN) IV, sodium phosphate, zolpidem  Time spent: 35 minutes  Author: Gillis Santa. MD [facility] 03/24/2023 11:37 AM  To reach On-call, see care teams to locate the attending and reach out to them via [email or web address]. If 7PM-7AM, please contact night-coverage If you still have difficulty reaching the attending provider, please page the [facility] (Director on Call) for [facility] on amion for assistance.

## 2023-03-24 NOTE — Care Management Important Message (Signed)
Important Message  Patient Details  Name: Jordan Arellano MRN: [id number] Date of Birth: March 10, 1951   Medicare Important Message Given:  Yes     Olegario Messier A Schuyler Olden 03/24/2023, 11:35 AM

## 2023-03-24 NOTE — Telephone Encounter (Signed)
Duplicate message. Referral message has been sent to [place].

## 2023-03-24 NOTE — Plan of Care (Signed)

## 2023-03-24 NOTE — Progress Notes (Signed)
Physical Therapy Treatment Patient Details Name: Jordan Arellano MRN: [id number] DOB: 06/09/1951 Today's Date: 03/24/2023   History of Present Illness Jordan Arellano is a 72 y.o. male with medical history significant for HTN, DM, PAD s/p right BKA with history of revascularization left leg who presents to the ED with right hip pain following an accidental fall onto the right hip while letting his dog out.  Patient currently uses a prosthetic limb on the right leg. S/p R hip IM nailing 03/21/23.    PT Comments    Patient received in recliner. Agrees to PT session.  He reports mod R hip pain. Patient requires cues and mod A to stand from recliner. Difficulty shifting hands from chair to walker and maintaining balance. Once standing he is able to stand with min guard, but easily fatigues. Performed sit to stand x 3 reps and then LE strengthening exercises in recliner and standing. He will continue to benefit from skilled PT to improve strength, safety and functional independence.     Recommendations for follow up therapy are one component of a multi-disciplinary discharge planning process, led by the attending physician.  Recommendations may be updated based on patient status, additional functional criteria and insurance authorization.  Follow Up Recommendations  Can patient physically be transported by private vehicle: No    Assistance Recommended at Discharge Intermittent Supervision/Assistance  Patient can return home with the following A lot of help with walking and/or transfers;A little help with bathing/dressing/bathroom;Assist for transportation;Help with stairs or ramp for entrance   Equipment Recommendations  None recommended by PT (TBD)    Recommendations for Other Services       Precautions / Restrictions Precautions Precautions: Fall Precaution Comments: history of previous R BKA Restrictions Weight Bearing Restrictions: Yes RLE Weight Bearing: Non weight bearing      Mobility  Bed Mobility               General bed mobility comments: NT, pt received/left in recliner    Transfers Overall transfer level: Needs assistance Equipment used: Rolling walker (2 wheels) Transfers: Sit to/from Stand Sit to Stand: Mod assist           General transfer comment: VC for hand placement and safe technique, difficulty with shifting hands to RW from recliner, requiring mod A for safety    Ambulation/Gait                   Stairs             Wheelchair Mobility    Modified Rankin (Stroke Patients Only)       Balance Overall balance assessment: Needs assistance Sitting-balance support: Feet supported Sitting balance-Leahy Scale: Normal     Standing balance support: Bilateral upper extremity supported, During functional activity, Reliant on assistive device for balance Standing balance-Leahy Scale: Fair Standing balance comment: relies on RW. Able to static stand with min guard, fatigues quickly.                            Cognition Arousal/Alertness: Awake/alert Behavior During Therapy: WFL for tasks assessed/performed Overall Cognitive Status: Within Functional Limits for tasks assessed                                          Exercises Total Joint Exercises Quad Sets: AROM, Right, 10 reps Heel Slides: AROM,  Right, 10 reps Hip ABduction/ADduction: AROM, Right, 10 reps Straight Leg Raises: AAROM, Right, 10 reps Long Arc Quad: AROM, Right, 10 reps    General Comments        Pertinent Vitals/Pain Pain Assessment Faces Pain Scale: Hurts a little bit Pain Location: R hip Pain Descriptors / Indicators: Aching, Grimacing Pain Intervention(s): Monitored during session, Repositioned    Home Living                          Prior Function            PT Goals (current goals can now be found in the care plan section) Acute Rehab PT Goals Patient Stated Goal: improve strength  and mobility PT Goal Formulation: With patient Time For Goal Achievement: 04/05/23 Potential to Achieve Goals: Good Progress towards PT goals: Progressing toward goals    Frequency    7X/week      PT Plan Current plan remains appropriate    Co-evaluation              AM-PAC PT "6 Clicks" Mobility   Outcome Measure  Help needed turning from your back to your side while in a flat bed without using bedrails?: A Little Help needed moving from lying on your back to sitting on the side of a flat bed without using bedrails?: A Little Help needed moving to and from a bed to a chair (including a wheelchair)?: A Little Help needed standing up from a chair using your arms (e.g., wheelchair or bedside chair)?: A Lot Help needed to walk in hospital room?: A Lot Help needed climbing 3-5 steps with a railing? : Total 6 Click Score: 14    End of Session Equipment Utilized During Treatment: Gait belt Activity Tolerance: Patient tolerated treatment well;Patient limited by fatigue Patient left: in chair;with call bell/phone within reach Nurse Communication: Mobility status PT Visit Diagnosis: Other abnormalities of gait and mobility (R26.89);Muscle weakness (generalized) (M62.81);History of falling (Z91.81);Difficulty in walking, not elsewhere classified (R26.2);Pain Pain - Right/Left: Right Pain - part of body: Hip     Time: 1610-9604 PT Time Calculation (min) (ACUTE ONLY): 15 min  Charges:  $Therapeutic Exercise: 8-22 mins                     Dennise Bamber, PT, GCS 03/24/23,11:28 AM

## 2023-03-24 NOTE — Telephone Encounter (Signed)
Patients sister called back in asking have referral been sent out yet. She said that it can be attn to [place].  [facility]  [address],[place]

## 2023-03-25 ENCOUNTER — Telehealth (INDEPENDENT_AMBULATORY_CARE_PROVIDER_SITE_OTHER): Payer: Self-pay | Admitting: Nurse Practitioner

## 2023-03-25 ENCOUNTER — Other Ambulatory Visit (INDEPENDENT_AMBULATORY_CARE_PROVIDER_SITE_OTHER): Payer: Self-pay | Admitting: Nurse Practitioner

## 2023-03-25 LAB — BASIC METABOLIC PANEL
Anion gap: 8 (ref 5–15)
BUN: 35 mg/dL — ABNORMAL HIGH (ref 8–23)
CO2: 22 mmol/L (ref 22–32)
Calcium: 8.3 mg/dL — ABNORMAL LOW (ref 8.9–10.3)
Chloride: 105 mmol/L (ref 98–111)
Creatinine, Ser: 1.04 mg/dL (ref 0.61–1.24)
GFR, Estimated: >60 mL/min (ref >60)
Glucose, Bld: 160 mg/dL — ABNORMAL HIGH (ref 70–99)
Potassium: 4.7 mmol/L (ref 3.5–5.1)
Sodium: 135 mmol/L (ref 135–145)

## 2023-03-25 LAB — CBC
HCT: 25.1 % — ABNORMAL LOW (ref 39.0–52.0)
Hemoglobin: 8.1 g/dL — ABNORMAL LOW (ref 13.0–17.0)
MCH: 28.9 pg (ref 26.0–34.0)
MCHC: 32.3 g/dL (ref 30.0–36.0)
MCV: 89.6 fL (ref 80.0–100.0)
Platelets: 245 10*3/uL (ref 150–400)
RBC: 2.8 MIL/uL — ABNORMAL LOW (ref 4.22–5.81)
RDW: 14.4 % (ref 11.5–15.5)
WBC: 10.9 10*3/uL — ABNORMAL HIGH (ref 4.0–10.5)
nRBC: 0 % (ref 0.0–0.2)

## 2023-03-25 LAB — GLUCOSE, CAPILLARY
Glucose-Capillary: 142 mg/dL — ABNORMAL HIGH (ref 70–99)
Glucose-Capillary: 285 mg/dL — ABNORMAL HIGH (ref 70–99)
Glucose-Capillary: 209 mg/dL — ABNORMAL HIGH (ref 70–99)

## 2023-03-25 MED ORDER — POLYETHYLENE GLYCOL 3350 17 G PO PACK: 17.0000 g | PACK | Freq: Every day | ORAL | 0 refills | Status: DC

## 2023-03-25 MED ORDER — BISACODYL 5 MG PO TBEC: 10.0000 mg | DELAYED_RELEASE_TABLET | Freq: Every day | ORAL | 0 refills | Status: DC | PRN

## 2023-03-25 MED ORDER — CYANOCOBALAMIN 1000 MCG PO TABS: 1000.0000 ug | ORAL_TABLET | Freq: Every day | ORAL | 0 refills | Status: AC

## 2023-03-25 MED ORDER — HYDROCODONE-ACETAMINOPHEN 5-325 MG PO TABS: 1.0000 | ORAL_TABLET | Freq: Four times a day (QID) | ORAL | 0 refills | Status: DC | PRN

## 2023-03-25 MED ORDER — FOLIC ACID 1 MG PO TABS: 1.0000 mg | ORAL_TABLET | Freq: Every day | ORAL | 0 refills | Status: DC

## 2023-03-25 MED ORDER — VITAMIN D (ERGOCALCIFEROL) 1.25 MG (50000 UNIT) PO CAPS: 50000.0000 [IU] | ORAL_CAPSULE | ORAL | 0 refills | Status: DC

## 2023-03-25 MED ORDER — FERROUS SULFATE 325 (65 FE) MG PO TABS: 325.0000 mg | ORAL_TABLET | Freq: Every day | ORAL | 0 refills | Status: DC

## 2023-03-25 MED ORDER — ASCORBIC ACID 500 MG PO TABS: 500.0000 mg | ORAL_TABLET | Freq: Every day | ORAL | 0 refills | Status: DC

## 2023-03-25 NOTE — TOC Progression Note (Signed)
Transition of Care [facility]) - Progression Note    Patient Details  Name: Jordan Arellano MRN: [id number] Date of Birth: July 14, 1951  [facility]) CM/SW Contact  Marlowe Sax, RN Phone Number: 03/25/2023, 1:34 PM  Clinical Narrative:   Sherron Monday with [name] the patients sister and provided her the room number 4B at [facility] called to transport There are several ahead of him    Expected Discharge Plan: Skilled Nursing Facility Barriers to Discharge: Insurance Authorization  Expected Discharge Plan and Services   Discharge Planning Services: CM Consult   Living arrangements for the past 2 months: Single Family Home Expected Discharge Date: 03/25/23                 DME Agency: NA       HH Arranged: NA           Social Determinants of Health (SDOH) Interventions SDOH Screenings   Food Insecurity: No Food Insecurity (03/21/2023)  Housing: Low Risk  (03/21/2023)  Transportation Needs: No Transportation Needs (03/21/2023)  Utilities: Not At Risk (03/21/2023)  Alcohol Screen: Low Risk  (04/07/2022)  Depression (PHQ2-9): Low Risk  (04/07/2022)  Financial Resource Strain: Low Risk  (10/08/2021)  Physical Activity: Insufficiently Active (04/07/2022)  Social Connections: Socially Isolated (04/07/2022)  Stress: No Stress Concern Present (04/07/2022)  Tobacco Use: High Risk (03/22/2023)    Readmission Risk Interventions     No data to display

## 2023-03-25 NOTE — TOC Progression Note (Addendum)
Transition of Care [facility]) - Progression Note    Patient Details  Name: Jordan Arellano MRN: [id number] Date of Birth: Jan 17, 1951  Transition of Care [facility]) CM/SW Contact  Marlowe Sax, RN Phone Number: 03/25/2023, 8:38 AM  Clinical Narrative:     Had a conversation with the sister [name] this morning, she tells me they need The patient to be in [place] because she does everything for him, I explained he would have to agree that he was pretty strong about wanting to be in [place] where he lives, she requested that I speak to him, I spoke to the patient and he agrees that he needs to be in [place] to go to [facility] I called Archie Patten at [facility] and left a secure VM asking if they still have the bed available, awaiting a call back  Insurance will need to be changed to [facility] as well   Update, [facility] does have a bed, the patient is agreeable, Ins was started again to change facility  Ref number [id number]  Expected Discharge Plan: Skilled Nursing Facility Barriers to Discharge: Insurance Authorization  Expected Discharge Plan and Services                                               Social Determinants of Health (SDOH) Interventions SDOH Screenings   Food Insecurity: No Food Insecurity (03/21/2023)  Housing: Low Risk  (03/21/2023)  Transportation Needs: No Transportation Needs (03/21/2023)  Utilities: Not At Risk (03/21/2023)  Alcohol Screen: Low Risk  (04/07/2022)  Depression (PHQ2-9): Low Risk  (04/07/2022)  Financial Resource Strain: Low Risk  (10/08/2021)  Physical Activity: Insufficiently Active (04/07/2022)  Social Connections: Socially Isolated (04/07/2022)  Stress: No Stress Concern Present (04/07/2022)  Tobacco Use: High Risk (03/22/2023)    Readmission Risk Interventions     No data to display

## 2023-03-25 NOTE — Progress Notes (Signed)
OT Cancellation Note  Patient Details Name: Maurice Fotheringham MRN: [id number] DOB: [birth year]   Cancelled Treatment:    Reason Eval/Treat Not Completed: Patient declined, no reason specified. Upon attempt, pt sleeping and wakes easily to OT's voice. Pt declines OT this date despite encouragement. Will re-attempt at later date.   Arman Filter., MPH, MS, OTR/L ascom [phone] 03/25/23, 11:06 AM

## 2023-03-25 NOTE — Telephone Encounter (Signed)
Patients sister called in requesting a call back from NP Sheppard Plumber at NP convenience     [phone]

## 2023-03-25 NOTE — Telephone Encounter (Signed)
Patient sister called to ask for prescription for updated prosthetic.  We will write updated prescription and fax.  Patient sister was advised that sometimes an updated office note is required however she felt that that would not be necessary.  So she is aware that if there could be other documentation requirements but did not feel that would be an issue at this time.

## 2023-03-25 NOTE — Discharge Summary (Signed)
[facility] Discharge Summary   Patient: Jordan Arellano ZOX:[id number]  PCP: Mort Sawyers, FNP  Date of admission: 03/20/2023   Date of discharge:  03/25/2023     Discharge Diagnoses:  Principal Problem:   Closed right hip fracture, initial encounter Active Problems:   AKI (acute kidney injury)   Accidental fall   Leukocytosis   History of CVA (cerebrovascular accident) without residual deficits   Essential hypertension   PAD s/p right BKA, history revascularization left leg   DM (diabetes mellitus) with peripheral vascular complication   Admitted From: Home Disposition:  SNF   Recommendations for Outpatient Follow-up:  Follow with PCP, patient should be seen by an MD in 1 to 2 days, monitor BP and titrate medications accordingly, discontinued amlodipine due to soft blood pressure. Follow with orthopedic surgery in 2 weeks for postop check and follow-up x-ray. Follow up LABS/TEST:  CBC in 1 wk   Contact information for after-discharge care     Destination     [facility] Preferred SNF .   Service: Skilled Nursing Contact information: [name] [place] [address] [phone]                    Diet recommendation: Cardiac and Carb modified diet  Activity: The patient is advised to gradually reintroduce usual activities, as tolerated  Discharge Condition: stable  Code Status: Full code   History of present illness: As per the H and P dictated on admission  Hospital Course:  Jordan Arellano is a 72 y.o. male with medical history significant for HTN, DM, PAD s/p right BKA with history of revascularization left leg who presents to the ED with right hip pain following an accidental fall onto the right hip while letting his dog out.  Patient currently uses a prosthetic limb on the right leg.  Patient was previously in his usual state of health.Labs significant for WBC 17,000, hemoglobin 11.2, baseline 12.8, creatinine 1.66  (baseline of 1.12). 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 Patient was pain controlled, treated with an IV fluid bolus and infusion, The ED provider spoke with orthopedist, Dr. Hyacinth Meeker who plans to take patient to the OR on 4/15. Hospitalist consulted for admission.    Assessment and Plan: # Closed right hip fracture, due to Accidental fall Right hip fracture, intertrochanteric, nondisplaced, continue Percocet as needed for pain control. Orthopedic consulted, s/p right ORIF done on 4/15 Continue aspirin and Plavix as per orthopedic surgery and follow-up in 2 weeks as an outpatient for postop check and x-ray. PT and OT eval done, recommend SNF placement.  Patient got better for for SNF placement and insurance authorization done.  Patient is stable to discharge, agreed with the discharge planning. # Anemia most likely due to postoperative blood loss Hb 8.1 dropped but stable.  Repeat CBC after 1 week # AKI (acute kidney injury), Creatinine 1.66 (baseline of 1.12) Possibly related to BP meds of spironolactone and olmesartan Patient received an IV fluid bolus and was started on IV infusion from the ED,  Cr 1.04 improved. Resumed home meds, continue oral hydration. # Leukocytosis, Resolved, Suspect reactive from fracture. Chest x-ray nonacute, UA not very impressive for UTI. WBC count trended down to NL,  # DM (diabetes mellitus) with peripheral vascular complication, s/p NovoLog sliding scale during hospital stay.  Resumed home medications on discharge.  Continue diabetic diet.  Monitor CBG. # PAD s/p right BKA, history revascularization left leg Continue aspirin and rosuvastatin, Resumed Plavix # Essential hypertension, Continue spironolactone and irbesartan.  Discontinued amlodipine due to soft blood pressure.  Monitor BP and titrate medication accordingly. # History of CVA  (cerebrovascular accident) without residual deficits Continue aspirin, Plavix and atorvastatin # Iron deficiency, transferrin saturation 7%, s/p Venofer 200 mg IV daily during hospital stay followed by oral supplement. # Folic acid level 6.9, at lower end, patient will benefit from oral supplement. # Vitamin B12 level 307, goal >400, started vitamin B12 1000 mcg IM injection during hospital stay followed by oral supplement # Vitamin D deficiency: started vitamin D 50,000 units p.o. weekly, follow with PCP to repeat vitamin D level after 3 to 6 months.  Body mass index is 20.45 kg/m.  Nutrition Problem: Increased nutrient needs Etiology: post-op healing Nutrition Interventions: Interventions: MVI, Glucerna shake     Pain control  - [place] Controlled Substance Reporting System database could not be reviewed as website is not working.   -Norco 10 tablets prescribed - Patient was instructed, not to drive, operate heavy machinery, perform activities at heights, swimming or participation in water activities or provide baby sitting services while on Pain, Sleep and Anxiety Medications; until his outpatient Physician has advised to do so again.  - Also recommended to not to take more than prescribed Pain, Sleep and Anxiety Medications.  Patient was seen by physical therapy, who recommended Therapy, SNF placement, which was arranged. On the day of the discharge the patient's vitals were stable, and no other acute medical condition were reported by patient. the patient was felt safe to be discharge at [facility].  Consultants: Orthopedic surgery Procedures: Right hip ORIF  Discharge Exam: General: Appear in no distress, no Rash; Oral Mucosa Clear, moist. Cardiovascular: S1 and S2 Present, no Murmur, Respiratory: normal respiratory effort, Bilateral Air entry present and no Crackles, no wheezes Abdomen: Bowel Sound present, Soft and no tenderness, no hernia Extremities: no Pedal edema, no calf  tenderness, Righ BKA and s/p Right Hip ORIF, dressing CDI, some dried blood noticed.   Neurology: alert and oriented to time, place, and person affect appropriate.  Filed Weights   03/20/23 2314  Weight: 70.3 kg   Vitals:   03/25/23 0009 03/25/23 0853  BP: (!) 111/59 118/61  Pulse: 74 66  Resp: 16 18  Temp: 98.2 F (36.8 C) (!) 97.5 F (36.4 C)  SpO2: 96% 98%    DISCHARGE MEDICATION: Allergies as of 03/25/2023   No Known Allergies      Medication List     STOP taking these medications    amLODipine 10 MG tablet Commonly known as: NORVASC       TAKE these medications    acetaminophen 500 MG tablet Commonly known as: TYLENOL Take 500 mg by mouth every 6 (six) hours as needed.   ascorbic acid 500 MG tablet Commonly known as: VITAMIN C Take 1 tablet (500 mg total) by mouth daily. Start taking on: March 26, 2023   aspirin 81 MG tablet Take 81 mg by mouth daily.   bisacodyl 5 MG EC tablet Commonly known as: bisacodyl Take 2 tablets (10 mg total) by mouth daily as needed for moderate constipation.   blood glucose meter kit and supplies Kit Dispense based on patient and insurance preference. Use two times daily as directed (before breakfast and at bedtime. (FOR ICD-10: E11.51)   clopidogrel 75 MG tablet Commonly known as: Plavix Take 1 tablet (  75 mg total) by mouth daily.   cyanocobalamin 1000 MCG tablet Take 1 tablet (1,000 mcg total) by mouth daily. Start taking on: March 31, 2023   ferrous sulfate 325 (65 FE) MG tablet Take 1 tablet (325 mg total) by mouth daily with breakfast. Start taking on: March 26, 2023   folic acid 1 MG tablet Commonly known as: FOLVITE Take 1 tablet (1 mg total) by mouth daily. Start taking on: March 26, 2023   HYDROcodone-acetaminophen 5-325 MG tablet Commonly known as: NORCO/VICODIN Take 1-2 tablets by mouth every 6 (six) hours as needed for moderate pain.   icosapent Ethyl 1 g capsule Commonly known as: Vascepa Take  2 capsules (2 g total) by mouth 2 (two) times daily.   latanoprost 0.005 % ophthalmic solution Commonly known as: XALATAN Place 1 drop into both eyes at bedtime.   metFORMIN 850 MG tablet Commonly known as: GLUCOPHAGE TAKE 1 TABLET BY MOUTH TWICE  DAILY WITH MEALS   olmesartan 40 MG tablet Commonly known as: BENICAR TAKE 1 TABLET BY MOUTH DAILY   polyethylene glycol 17 g packet Commonly known as: MiraLax Take 17 g by mouth daily.   rosuvastatin 40 MG tablet Commonly known as: Crestor Take 1 tablet (40 mg total) by mouth daily.   sertraline 100 MG tablet Commonly known as: ZOLOFT Take 1 tablet (100 mg total) by mouth daily.   spironolactone 25 MG tablet Commonly known as: ALDACTONE Take 1 tablet (25 mg total) by mouth daily.   Vitamin D (Ergocalciferol) 1.25 MG (50000 UNIT) Caps capsule Commonly known as: DRISDOL Take 1 capsule (50,000 Units total) by mouth every 7 (seven) days. Start taking on: March 31, 2023       No Known Allergies Discharge Instructions     Call MD for:  difficulty breathing, headache or visual disturbances   Complete by: As directed    Call MD for:  extreme fatigue   Complete by: As directed    Call MD for:  persistant dizziness or light-headedness   Complete by: As directed    Call MD for:  severe uncontrolled pain   Complete by: As directed    Call MD for:  temperature >100.4   Complete by: As directed    Diet - low sodium heart healthy   Complete by: As directed    Discharge instructions   Complete by: As directed    Follow with PCP, patient should be seen by an MD in 1 to 2 days, monitor BP and titrate medications accordingly, discontinued amlodipine due to soft blood pressure. Follow with orthopedic surgery in 2 weeks for postop check and follow-up x-ray.   Increase activity slowly   Complete by: As directed        The results of significant diagnostics from this hospitalization (including imaging, microbiology, ancillary and  laboratory) are listed below for reference.    Significant Diagnostic Studies: DG HIP UNILAT WITH PELVIS 2-3 VIEWS RIGHT  Result Date: 03/21/2023 CLINICAL DATA:  Fracture. EXAM: DG HIP (WITH OR WITHOUT PELVIS) 2-3V RIGHT COMPARISON:  Radiograph yesterday FINDINGS: Four fluoroscopic spot views of the right hip obtained in the operating room. Interval intramedullary nail with distal locking and trans trochanteric screw fixation of proximal femur fracture. Fluoroscopy time 38 seconds. Dose 4.89 mGy. IMPRESSION: Procedural fluoroscopy during proximal femur fracture ORIF. Electronically Signed   By: Narda Rutherford M.D.   On: 03/21/2023 14:46   DG C-Arm 1-60 Min-No Report  Result Date: 03/21/2023 Fluoroscopy was utilized by the  requesting physician.  No radiographic interpretation.   DG Chest Port 1 View  Result Date: 03/21/2023 CLINICAL DATA:  Right hip fracture, preop chest radiograph EXAM: PORTABLE CHEST 1 VIEW COMPARISON:  07/21/2022 FINDINGS: Lungs are clear.  No pleural effusion or pneumothorax. The heart is normal in size. IMPRESSION: No acute cardiopulmonary disease. Electronically Signed   By: Charline Bills M.D.   On: 03/21/2023 00:47   DG Hip Unilat  With Pelvis 2-3 Views Right  Result Date: 03/20/2023 CLINICAL DATA:  Trauma, tripped landing on right hip. EXAM: DG HIP (WITH OR WITHOUT PELVIS) 2-3V RIGHT COMPARISON:  None Available. FINDINGS: There is a nondisplaced fracture of the femoral neck. Femoral head is seated in the acetabulum. Mild osteoarthritis of both hips. Bony pelvis and pubic rami are intact. IMPRESSION: Nondisplaced right femoral neck fracture. Electronically Signed   By: Narda Rutherford M.D.   On: 03/20/2023 23:36    Microbiology: Recent Results (from the past 240 hour(s))  Surgical PCR screen     Status: None   Collection Time: 03/21/23  3:45 AM   Specimen: Nasal Mucosa; Nasal Swab  Result Value Ref Range Status   MRSA, PCR NEGATIVE NEGATIVE Final    Staphylococcus aureus NEGATIVE NEGATIVE Final    Comment: (NOTE) The Xpert SA Assay (FDA approved for NASAL specimens in patients 24 years of age and older), is one component of a comprehensive surveillance program. It is not intended to diagnose infection nor to guide or monitor treatment. Performed at [facility], [address] [place], [place] [address]   Urine Culture     Status: None   Collection Time: 03/22/23 11:57 AM   Specimen: Urine, Random  Result Value Ref Range Status   Specimen Description   Final    URINE, RANDOM Performed at [facility], [address] [place], [place] [address]    Special Requests   Final    NONE Reflexed from [id number] Performed at [facility], [address], [place], [place] [address]    Culture   Final    NO GROWTH Performed at [facility], [address], [place], [place] [address]    Report Status 03/23/2023 FINAL  Final     Labs: CBC: Recent Labs  Lab 03/21/23 0039 03/22/23 0551 03/23/23 0532 03/24/23 0506 03/25/23 0545  WBC 17.2* 14.7* 10.7* 10.4 10.9*  NEUTROABS 14.2*  --   --   --   --   HGB 11.2* 9.0* 8.2* 8.0* 8.1*  HCT 34.6* 28.1* 25.6* 24.8* 25.1*  MCV 88.7 88.9 89.2 88.9 89.6  PLT 286 232 213 233 245   Basic Metabolic Panel: Recent Labs  Lab 03/21/23 0039 03/22/23 0551 03/23/23 0532 03/24/23 0506 03/25/23 0545  NA 138 137 135 137 135  K 4.6 4.0 4.3 4.6 4.7  CL 105 105 106 106 105  CO2 24 24 24 25 22   GLUCOSE 189* 151* 169* 191* 160*  BUN 27* 23 31* 35* 35*  CREATININE 1.66* 1.21 1.15 1.05 1.04  CALCIUM 9.1 8.1* 8.2* 8.6* 8.3*  MG  --  2.1 2.1 2.5*  --   PHOS  --  2.8 3.0 3.6  --    Liver Function Tests: No results for input(s): "AST", "ALT", "ALKPHOS", "BILITOT", "PROT", "ALBUMIN" in the last 168 hours. No results for input(s): "LIPASE", "AMYLASE" in the last 168 hours. No results for input(s): "AMMONIA" in the last 168 hours. Cardiac Enzymes: No results for  input(s): "CKTOTAL", "CKMB", "CKMBINDEX", "TROPONINI" in  the last 168 hours. BNP (last 3 results) No results for input(s): "BNP" in the last 8760 hours. CBG: Recent Labs  Lab 03/24/23 1153 03/24/23 1640 03/24/23 2133 03/25/23 0759 03/25/23 1116  GLUCAP 173* 220* 237* 142* 285*    Time spent: 35 minutes  Signed:  Gillis Santa  [facility]  03/25/2023 12:59 PM

## 2023-03-25 NOTE — TOC Progression Note (Signed)
Transition of Care [facility]) - Progression Note    Patient Details  Name: Jordan Arellano MRN: [id number] Date of Birth: 1951-11-30  Transition of Care [facility]) CM/SW Contact  Marlowe Sax, RN Phone Number: 03/25/2023, 12:05 PM  Clinical Narrative:    Insurance approved to go to [employer] today [id number]   Expected Discharge Plan: Skilled Nursing Facility Barriers to Discharge: English as a second language teacher  Expected Discharge Plan and Services   Discharge Planning Services: CM Consult   Living arrangements for the past 2 months: Single Family Home                   DME Agency: NA       HH Arranged: NA           Social Determinants of Health (SDOH) Interventions SDOH Screenings   Food Insecurity: No Food Insecurity (03/21/2023)  Housing: Low Risk  (03/21/2023)  Transportation Needs: No Transportation Needs (03/21/2023)  Utilities: Not At Risk (03/21/2023)  Alcohol Screen: Low Risk  (04/07/2022)  Depression (PHQ2-9): Low Risk  (04/07/2022)  Financial Resource Strain: Low Risk  (10/08/2021)  Physical Activity: Insufficiently Active (04/07/2022)  Social Connections: Socially Isolated (04/07/2022)  Stress: No Stress Concern Present (04/07/2022)  Tobacco Use: High Risk (03/22/2023)    Readmission Risk Interventions     No data to display

## 2023-03-25 NOTE — Progress Notes (Signed)
Physical Therapy Treatment Patient Details Name: Jordan Arellano MRN: [id number] DOB: Apr 24, 1951 Today's Date: 03/25/2023   History of Present Illness Jordan Arellano is a 72 y.o. male with medical history significant for HTN, DM, PAD s/p right BKA with history of revascularization left leg who presents to the ED with right hip pain following an accidental fall onto the right hip while letting his dog out.  Patient currently uses a prosthetic limb on the right leg. S/p R hip IM nailing 03/21/23.    PT Comments    Pt was long sitting in bed upon arrival. He is A and O x 3. Agreeable to session and remained cooperative throughout. Author clarified MD orders for wt bearing restrictions. Pt remains NWB RLE. Pt has prosthetic for RLE from old BKA. Pt was able to demonstrate proper application of prosthesis however the leg socket is too big and pt planning to follow up for new one once able to bear wt on RLE again. Pt was able to fully participate in getting OOB. Stood form elevated bed height with min assist but required mod assist form standard height surfaces. He will continue to benefit form skilled PT at DC to maximize independence and safety with all ADLs. Current recs remain appropriate.    Recommendations for follow up therapy are one component of a multi-disciplinary discharge planning process, led by the attending physician.  Recommendations may be updated based on patient status, additional functional criteria and insurance authorization.     Assistance Recommended at Discharge Intermittent Supervision/Assistance  Patient can return home with the following A lot of help with walking and/or transfers;A little help with bathing/dressing/bathroom;Assist for transportation;Help with stairs or ramp for entrance   Equipment Recommendations  None recommended by PT       Precautions / Restrictions Precautions Precautions: Fall Precaution Comments: history of previous R BKA Restrictions Weight  Bearing Restrictions: No RLE Weight Bearing: Non weight bearing     Mobility  Bed Mobility Overal bed mobility: Needs Assistance Bed Mobility: Supine to Sit  Supine to sit: Min assist, HOB elevated  General bed mobility comments: Pt was able to exit L side of bed with increased time + min A    Transfers Overall transfer level: Needs assistance Equipment used: Rolling walker (2 wheels) Transfers: Sit to/from Stand Sit to Stand: Min assist, Mod assist Stand pivot transfers: Mod assist (for safety)    General transfer comment: min assist from slightly elevated bed height but mod assist from lower surfaces    Ambulation/Gait  General Gait Details: Due to NWB limited pt to pivoting to recliner only. unable to safely attempt" hopping" on LLE only         Cognition Arousal/Alertness: Awake/alert Behavior During Therapy: WFL for tasks assessed/performed Overall Cognitive Status: Within Functional Limits for tasks assessed      General Comments: Pt is A and O x 3. Was able to consistently follow commands throughout        Exercises Total Joint Exercises Quad Sets: AROM, Right, 10 reps Heel Slides: AROM, Right, 10 reps Hip ABduction/ADduction: AROM, Right, 10 reps Straight Leg Raises: AROM, 10 reps    General Comments General comments (skin integrity, edema, etc.): Pt was able to I'ly apply prosthesis while sitting EOB. Author feels pt would benefit from new prosthsis due to current one reqyuiring two liners + socks to fit properly. Pt reports having had prosthetic for over 5 years. Recommended pt follow up with prosthtist after cleared for wt bearing  Pertinent Vitals/Pain Pain Assessment Pain Assessment: 0-10 Pain Score: 3  Pain Location: R hip Pain Descriptors / Indicators: Aching, Grimacing Pain Intervention(s): Limited activity within patient's tolerance, Monitored during session, Premedicated before session, Repositioned     PT Goals (current goals can now  be found in the care plan section) Acute Rehab PT Goals Patient Stated Goal: " Get stronger and get a new prosthetic" Progress towards PT goals: Progressing toward goals    Frequency    7X/week      PT Plan Current plan remains appropriate       AM-PAC PT "6 Clicks" Mobility   Outcome Measure  Help needed turning from your back to your side while in a flat bed without using bedrails?: A Little Help needed moving from lying on your back to sitting on the side of a flat bed without using bedrails?: A Little Help needed moving to and from a bed to a chair (including a wheelchair)?: A Little Help needed standing up from a chair using your arms (e.g., wheelchair or bedside chair)?: A Lot Help needed to walk in hospital room?: Total Help needed climbing 3-5 steps with a railing? : Total 6 Click Score: 13    End of Session   Activity Tolerance: Patient tolerated treatment well Patient left: in chair;with call bell/phone within reach Nurse Communication: Mobility status PT Visit Diagnosis: Other abnormalities of gait and mobility (R26.89);Muscle weakness (generalized) (M62.81);History of falling (Z91.81);Difficulty in walking, not elsewhere classified (R26.2);Pain Pain - Right/Left: Right Pain - part of body: Hip     Time: 0454-0981 PT Time Calculation (min) (ACUTE ONLY): 23 min  Charges:  $Therapeutic Activity: 23-37 mins                     Jetta Lout PTA 03/25/23, 10:23 AM

## 2023-03-27 ENCOUNTER — Emergency Department: Payer: 59

## 2023-03-27 ENCOUNTER — Other Ambulatory Visit: Payer: Self-pay

## 2023-03-27 ENCOUNTER — Inpatient Hospital Stay
Admission: EM | Admit: 2023-03-27 | Discharge: 2023-03-31 | DRG: 690 | Disposition: A | Discharge status: Skilled Nursing Facility | Payer: 59 | Attending: Internal Medicine | Admitting: Internal Medicine

## 2023-03-27 DIAGNOSIS — F17210 Nicotine dependence, cigarettes, uncomplicated: Secondary | ICD-10-CM | POA: Diagnosis present

## 2023-03-27 DIAGNOSIS — I10 Essential (primary) hypertension: Secondary | ICD-10-CM

## 2023-03-27 DIAGNOSIS — E1151 Type 2 diabetes mellitus with diabetic peripheral angiopathy without gangrene: Secondary | ICD-10-CM | POA: Diagnosis present

## 2023-03-27 DIAGNOSIS — E785 Hyperlipidemia, unspecified: Secondary | ICD-10-CM

## 2023-03-27 DIAGNOSIS — F32A Depression, unspecified: Secondary | ICD-10-CM

## 2023-03-27 DIAGNOSIS — Z8673 Personal history of transient ischemic attack (TIA), and cerebral infarction without residual deficits: Secondary | ICD-10-CM

## 2023-03-27 DIAGNOSIS — M25551 Pain in right hip: Secondary | ICD-10-CM | POA: Diagnosis not present

## 2023-03-27 DIAGNOSIS — R531 Weakness: Secondary | ICD-10-CM | POA: Diagnosis not present

## 2023-03-27 DIAGNOSIS — N390 Urinary tract infection, site not specified: Secondary | ICD-10-CM | POA: Diagnosis not present

## 2023-03-27 DIAGNOSIS — Z7984 Long term (current) use of oral hypoglycemic drugs: Secondary | ICD-10-CM

## 2023-03-27 DIAGNOSIS — Z89511 Acquired absence of right leg below knee: Secondary | ICD-10-CM

## 2023-03-27 DIAGNOSIS — I452 Bifascicular block: Secondary | ICD-10-CM | POA: Diagnosis present

## 2023-03-27 DIAGNOSIS — N3000 Acute cystitis without hematuria: Secondary | ICD-10-CM | POA: Diagnosis not present

## 2023-03-27 DIAGNOSIS — B9689 Other specified bacterial agents as the cause of diseases classified elsewhere: Secondary | ICD-10-CM | POA: Diagnosis present

## 2023-03-27 DIAGNOSIS — Z85828 Personal history of other malignant neoplasm of skin: Secondary | ICD-10-CM

## 2023-03-27 DIAGNOSIS — Z7982 Long term (current) use of aspirin: Secondary | ICD-10-CM

## 2023-03-27 DIAGNOSIS — Z8249 Family history of ischemic heart disease and other diseases of the circulatory system: Secondary | ICD-10-CM

## 2023-03-27 DIAGNOSIS — Z79899 Other long term (current) drug therapy: Secondary | ICD-10-CM

## 2023-03-27 DIAGNOSIS — E119 Type 2 diabetes mellitus without complications: Secondary | ICD-10-CM

## 2023-03-27 DIAGNOSIS — Z1611 Resistance to penicillins: Secondary | ICD-10-CM | POA: Diagnosis present

## 2023-03-27 DIAGNOSIS — Z823 Family history of stroke: Secondary | ICD-10-CM

## 2023-03-27 DIAGNOSIS — Z7902 Long term (current) use of antithrombotics/antiplatelets: Secondary | ICD-10-CM

## 2023-03-27 DIAGNOSIS — E118 Type 2 diabetes mellitus with unspecified complications: Secondary | ICD-10-CM

## 2023-03-27 DIAGNOSIS — F325 Major depressive disorder, single episode, in full remission: Secondary | ICD-10-CM

## 2023-03-27 LAB — CBC WITH DIFFERENTIAL/PLATELET
Abs Immature Granulocytes: 0.08 10*3/uL — ABNORMAL HIGH (ref 0.00–0.07)
Basophils Absolute: 0.1 10*3/uL (ref 0.0–0.1)
Basophils Relative: 1 %
Eosinophils Absolute: 0.4 10*3/uL (ref 0.0–0.5)
Eosinophils Relative: 3 %
HCT: 30.8 % — ABNORMAL LOW (ref 39.0–52.0)
Hemoglobin: 9.8 g/dL — ABNORMAL LOW (ref 13.0–17.0)
Immature Granulocytes: 1 %
Lymphocytes Relative: 17 %
Lymphs Abs: 2.1 10*3/uL (ref 0.7–4.0)
MCH: 28.7 pg (ref 26.0–34.0)
MCHC: 31.8 g/dL (ref 30.0–36.0)
MCV: 90.3 fL (ref 80.0–100.0)
Monocytes Absolute: 0.9 10*3/uL (ref 0.1–1.0)
Monocytes Relative: 7 %
Neutro Abs: 9.2 10*3/uL — ABNORMAL HIGH (ref 1.7–7.7)
Neutrophils Relative %: 71 %
Platelets: 349 10*3/uL (ref 150–400)
RBC: 3.41 MIL/uL — ABNORMAL LOW (ref 4.22–5.81)
RDW: 14.9 % (ref 11.5–15.5)
WBC: 12.7 10*3/uL — ABNORMAL HIGH (ref 4.0–10.5)
nRBC: 0 % (ref 0.0–0.2)

## 2023-03-27 LAB — BASIC METABOLIC PANEL
Anion gap: 8 (ref 5–15)
BUN: 32 mg/dL — ABNORMAL HIGH (ref 8–23)
CO2: 24 mmol/L (ref 22–32)
Calcium: 9 mg/dL (ref 8.9–10.3)
Chloride: 103 mmol/L (ref 98–111)
Creatinine, Ser: 1.13 mg/dL (ref 0.61–1.24)
GFR, Estimated: >60 mL/min (ref >60)
Glucose, Bld: 195 mg/dL — ABNORMAL HIGH (ref 70–99)
Potassium: 5.4 mmol/L — ABNORMAL HIGH (ref 3.5–5.1)
Sodium: 135 mmol/L (ref 135–145)

## 2023-03-27 LAB — URINALYSIS, ROUTINE W REFLEX MICROSCOPIC
Bilirubin Urine: NEGATIVE
Glucose, UA: NEGATIVE mg/dL
Hgb urine dipstick: NEGATIVE
Ketones, ur: NEGATIVE mg/dL
Nitrite: NEGATIVE
Protein, ur: 30 mg/dL — AB
Specific Gravity, Urine: 1.016 (ref 1.005–1.030)
Squamous Epithelial / HPF: NONE SEEN /HPF (ref 0–5)
WBC, UA: >50 WBC/hpf (ref 0–5)
pH: 5 (ref 5.0–8.0)

## 2023-03-27 LAB — SEDIMENTATION RATE: Sed Rate: 127 mm/hr — ABNORMAL HIGH (ref 0–20)

## 2023-03-27 MED ORDER — ACETAMINOPHEN 650 MG RE SUPP: 650.0000 mg | Freq: Four times a day (QID) | RECTAL | Status: DC | PRN

## 2023-03-27 MED ORDER — VITAMIN D (ERGOCALCIFEROL) 1.25 MG (50000 UNIT) PO CAPS
50000.0000 [IU] | ORAL_CAPSULE | ORAL | Status: DC
  Administered 2023-03-31: 50000 [IU] via ORAL
  Filled 2023-03-27: qty 1

## 2023-03-27 MED ORDER — SERTRALINE HCL 50 MG PO TABS
100.0000 mg | ORAL_TABLET | Freq: Every day | ORAL | Status: DC
  Administered 2023-03-28 – 2023-03-31 (×4): 100 mg via ORAL
  Filled 2023-03-27 (×4): qty 2

## 2023-03-27 MED ORDER — INSULIN ASPART 100 UNIT/ML IJ SOLN
0.0000 [IU] | Freq: Three times a day (TID) | INTRAMUSCULAR | Status: DC
  Administered 2023-03-28 – 2023-03-29 (×2): 2 [IU] via SUBCUTANEOUS
  Administered 2023-03-29 – 2023-03-30 (×3): 3 [IU] via SUBCUTANEOUS
  Administered 2023-03-31: 2 [IU] via SUBCUTANEOUS
  Filled 2023-03-27 (×6): qty 1

## 2023-03-27 MED ORDER — SODIUM CHLORIDE 0.9 % IV SOLN
2.0000 g | Freq: Once | INTRAVENOUS | Status: AC
  Administered 2023-03-27: 2 g via INTRAVENOUS
  Filled 2023-03-27: qty 20

## 2023-03-27 MED ORDER — SODIUM CHLORIDE 0.9 % IV SOLN: INTRAVENOUS | Status: DC

## 2023-03-27 MED ORDER — IRBESARTAN 150 MG PO TABS
150.0000 mg | ORAL_TABLET | Freq: Every day | ORAL | Status: DC
  Administered 2023-03-28 – 2023-03-31 (×4): 150 mg via ORAL
  Filled 2023-03-27 (×4): qty 1

## 2023-03-27 MED ORDER — ACETAMINOPHEN 325 MG PO TABS
650.0000 mg | ORAL_TABLET | Freq: Four times a day (QID) | ORAL | Status: DC | PRN
  Administered 2023-03-30: 650 mg via ORAL

## 2023-03-27 MED ORDER — VITAMIN B-12 1000 MCG PO TABS
1000.0000 ug | ORAL_TABLET | Freq: Every day | ORAL | Status: DC
  Administered 2023-03-31: 1000 ug via ORAL
  Filled 2023-03-27: qty 1

## 2023-03-27 MED ORDER — ROSUVASTATIN CALCIUM 20 MG PO TABS
40.0000 mg | ORAL_TABLET | Freq: Every day | ORAL | Status: DC
  Administered 2023-03-27 – 2023-03-31 (×5): 40 mg via ORAL
  Filled 2023-03-27 (×2): qty 2
  Filled 2023-03-27: qty 1
  Filled 2023-03-27 (×5): qty 2

## 2023-03-27 MED ORDER — ONDANSETRON 4 MG PO TBDP
4.0000 mg | ORAL_TABLET | Freq: Once | ORAL | Status: AC
  Administered 2023-03-27: 4 mg via ORAL
  Filled 2023-03-27: qty 1

## 2023-03-27 MED ORDER — ONDANSETRON HCL 4 MG PO TABS: 4.0000 mg | ORAL_TABLET | Freq: Four times a day (QID) | ORAL | Status: DC | PRN

## 2023-03-27 MED ORDER — MAGNESIUM HYDROXIDE 400 MG/5ML PO SUSP: 30.0000 mL | Freq: Every day | ORAL | Status: DC | PRN

## 2023-03-27 MED ORDER — FERROUS SULFATE 325 (65 FE) MG PO TABS
325.0000 mg | ORAL_TABLET | Freq: Every day | ORAL | Status: DC
  Administered 2023-03-28 – 2023-03-31 (×4): 325 mg via ORAL
  Filled 2023-03-27 (×4): qty 1

## 2023-03-27 MED ORDER — POLYETHYLENE GLYCOL 3350 17 G PO PACK
17.0000 g | PACK | Freq: Every day | ORAL | Status: DC
  Administered 2023-03-28 – 2023-03-31 (×2): 17 g via ORAL
  Filled 2023-03-27 (×5): qty 1

## 2023-03-27 MED ORDER — FOLIC ACID 1 MG PO TABS
1.0000 mg | ORAL_TABLET | Freq: Every day | ORAL | Status: DC
  Administered 2023-03-27 – 2023-03-31 (×5): 1 mg via ORAL
  Filled 2023-03-27 (×5): qty 1

## 2023-03-27 MED ORDER — ASPIRIN 81 MG PO TBEC
81.0000 mg | DELAYED_RELEASE_TABLET | Freq: Every day | ORAL | Status: DC
  Administered 2023-03-27 – 2023-03-31 (×5): 81 mg via ORAL
  Filled 2023-03-27 (×5): qty 1

## 2023-03-27 MED ORDER — METFORMIN HCL 850 MG PO TABS
850.0000 mg | ORAL_TABLET | Freq: Two times a day (BID) | ORAL | Status: DC
  Administered 2023-03-27 – 2023-03-31 (×8): 850 mg via ORAL
  Filled 2023-03-27 (×9): qty 1

## 2023-03-27 MED ORDER — MORPHINE SULFATE (PF) 2 MG/ML IV SOLN: 2.0000 mg | INTRAVENOUS | Status: DC | PRN

## 2023-03-27 MED ORDER — BISACODYL 5 MG PO TBEC: 10.0000 mg | DELAYED_RELEASE_TABLET | Freq: Every day | ORAL | Status: DC | PRN

## 2023-03-27 MED ORDER — VITAMIN C 500 MG PO TABS
500.0000 mg | ORAL_TABLET | Freq: Every day | ORAL | Status: DC
  Administered 2023-03-27 – 2023-03-31 (×5): 500 mg via ORAL
  Filled 2023-03-27 (×5): qty 1

## 2023-03-27 MED ORDER — ENOXAPARIN SODIUM 40 MG/0.4ML IJ SOSY
40.0000 mg | PREFILLED_SYRINGE | INTRAMUSCULAR | Status: DC
  Administered 2023-03-27 – 2023-03-30 (×4): 40 mg via SUBCUTANEOUS
  Filled 2023-03-27 (×4): qty 0.4

## 2023-03-27 MED ORDER — TRAZODONE HCL 50 MG PO TABS: 25.0000 mg | ORAL_TABLET | Freq: Every evening | ORAL | Status: DC | PRN

## 2023-03-27 MED ORDER — ICOSAPENT ETHYL 1 G PO CAPS
2.0000 g | ORAL_CAPSULE | Freq: Two times a day (BID) | ORAL | Status: DC
  Administered 2023-03-27 – 2023-03-31 (×8): 2 g via ORAL
  Filled 2023-03-27 (×9): qty 2

## 2023-03-27 MED ORDER — ACETAMINOPHEN 500 MG PO TABS
500.0000 mg | ORAL_TABLET | Freq: Four times a day (QID) | ORAL | Status: DC | PRN
  Filled 2023-03-27: qty 1

## 2023-03-27 MED ORDER — ONDANSETRON HCL 4 MG/2ML IJ SOLN: 4.0000 mg | Freq: Four times a day (QID) | INTRAMUSCULAR | Status: DC | PRN

## 2023-03-27 MED ORDER — HYDROCODONE-ACETAMINOPHEN 5-325 MG PO TABS
1.0000 | ORAL_TABLET | Freq: Four times a day (QID) | ORAL | Status: DC | PRN
  Administered 2023-03-28 – 2023-03-29 (×5): 2 via ORAL
  Filled 2023-03-27 (×5): qty 2

## 2023-03-27 MED ORDER — LATANOPROST 0.005 % OP SOLN
1.0000 [drp] | Freq: Every day | OPHTHALMIC | Status: DC
  Administered 2023-03-27 – 2023-03-30 (×4): 1 [drp] via OPHTHALMIC
  Filled 2023-03-27 (×2): qty 2.5

## 2023-03-27 MED ORDER — OXYCODONE-ACETAMINOPHEN 5-325 MG PO TABS
1.0000 | ORAL_TABLET | Freq: Once | ORAL | Status: AC
  Administered 2023-03-27: 1 via ORAL
  Filled 2023-03-27: qty 1

## 2023-03-27 MED ORDER — SPIRONOLACTONE 25 MG PO TABS
25.0000 mg | ORAL_TABLET | Freq: Every day | ORAL | Status: DC
  Administered 2023-03-28 – 2023-03-31 (×4): 25 mg via ORAL
  Filled 2023-03-27 (×4): qty 1

## 2023-03-27 MED ORDER — CLOPIDOGREL BISULFATE 75 MG PO TABS
75.0000 mg | ORAL_TABLET | Freq: Every day | ORAL | Status: DC
  Administered 2023-03-28 – 2023-03-31 (×4): 75 mg via ORAL
  Filled 2023-03-27 (×4): qty 1

## 2023-03-27 MED ORDER — INSULIN ASPART 100 UNIT/ML IJ SOLN
0.0000 [IU] | Freq: Every day | INTRAMUSCULAR | Status: DC
  Filled 2023-03-27: qty 1

## 2023-03-27 MED ORDER — SODIUM CHLORIDE 0.9 % IV SOLN
1.0000 g | INTRAVENOUS | Status: DC
  Administered 2023-03-28 – 2023-03-29 (×2): 1 g via INTRAVENOUS
  Filled 2023-03-27 (×2): qty 1

## 2023-03-27 NOTE — ED Provider Notes (Signed)
[facility] Provider Note    Event Date/Time   First MD Initiated Contact with Patient 03/27/23 1548     (approximate)   History   Hip Pain   HPI  Jordan Arellano is a 72 y.o. male with history of recent surgery for right hip fracture, here with ongoing hip pain, weakness, and poor living situation.  Per report, the patient was discharged to a skilled nursing facility yesterday.  Family states that there were dead insects and roaches in the room and it was in complete disarray.  Patient did not receive appropriate care.  This is only present for evaluation.  The patient has had ongoing right hip pain but states it is not necessarily acutely worse.  He does feel generally fatigued, and has had some chills.  Denies any significant pain with range of motion of the leg.  No cough.     Physical Exam   Triage Vital Signs: ED Triage Vitals  Enc Vitals Group     BP 03/27/23 1557 123/87     Pulse Rate 03/27/23 1557 75     Resp 03/27/23 1557 18     Temp 03/27/23 1557 97.7 F (36.5 C)     Temp Source 03/27/23 1557 Oral     SpO2 03/27/23 1557 100 %     Weight 03/27/23 1559 160 lb (72.6 kg)     Height 03/27/23 1559 6\' 1"  (1.854 m)     Head Circumference --      Peak Flow --      Pain Score 03/27/23 1558 7     Pain Loc --      Pain Edu? --      Excl. in GC? --     Most recent vital signs: Vitals:   03/27/23 1557  BP: 123/87  Pulse: 75  Resp: 18  Temp: 97.7 F (36.5 C)  SpO2: 100%     General: Awake, no distress.  CV:  Good peripheral perfusion.  Regular rate and rhythm. Resp:  Normal work of breathing.  Abd:  No distention.  Minimal suprapubic tenderness.  No guarding or rebound. Other:  Right thigh with operative dressing in place, no surrounding erythema.  Moderate ecchymoses.   ED Results / Procedures / Treatments   Labs (all labs ordered are listed, but only abnormal results are displayed) Labs Reviewed  CBC WITH DIFFERENTIAL/PLATELET -  Abnormal; Notable for the following components:      Result Value   WBC 12.7 (*)    RBC 3.41 (*)    Hemoglobin 9.8 (*)    HCT 30.8 (*)    Neutro Abs 9.2 (*)    Abs Immature Granulocytes 0.08 (*)    All other components within normal limits  BASIC METABOLIC PANEL - Abnormal; Notable for the following components:   Potassium 5.4 (*)    Glucose, Bld 195 (*)    BUN 32 (*)    All other components within normal limits  SEDIMENTATION RATE - Abnormal; Notable for the following components:   Sed Rate 127 (*)    All other components within normal limits  URINALYSIS, ROUTINE W REFLEX MICROSCOPIC - Abnormal; Notable for the following components:   Color, Urine YELLOW (*)    APPearance CLOUDY (*)    Protein, ur 30 (*)    Leukocytes,Ua LARGE (*)    Bacteria, UA MANY (*)    All other components within normal limits  URINE CULTURE     EKG    RADIOLOGY  DG hip right: No acute abnormality Chest x-ray: Negative   I also independently reviewed and agree with radiologist interpretations.   PROCEDURES:  Critical Care performed: No   .1-3 Lead EKG Interpretation  Performed by: Shaune Pollack, MD Authorized by: Shaune Pollack, MD     Interpretation: normal     ECG rate:  70-90   ECG rate assessment: normal     Rhythm: sinus rhythm     Ectopy: none     Conduction: normal   Comments:     Indication: Weakness     MEDICATIONS ORDERED IN ED: Medications  Vitamin D (Ergocalciferol) (DRISDOL) 1.25 MG (50000 UNIT) capsule 50,000 Units (has no administration in time range)  spironolactone (ALDACTONE) tablet 25 mg (has no administration in time range)  sertraline (ZOLOFT) tablet 100 mg (has no administration in time range)  rosuvastatin (CRESTOR) tablet 40 mg (has no administration in time range)  polyethylene glycol (MIRALAX / GLYCOLAX) packet 17 g (0 g Oral Hold 03/27/23 1728)  irbesartan (AVAPRO) tablet 150 mg (has no administration in time range)  metFORMIN (GLUCOPHAGE) tablet  850 mg (850 mg Oral Given 03/27/23 1829)  latanoprost (XALATAN) 0.005 % ophthalmic solution 1 drop (has no administration in time range)  icosapent Ethyl (VASCEPA) 1 g capsule 2 g (has no administration in time range)  HYDROcodone-acetaminophen (NORCO/VICODIN) 5-325 MG per tablet 1-2 tablet (has no administration in time range)  folic acid (FOLVITE) tablet 1 mg (1 mg Oral Given 03/27/23 1726)  ferrous sulfate tablet 325 mg (has no administration in time range)  cyanocobalamin (VITAMIN B12) tablet 1,000 mcg (has no administration in time range)  clopidogrel (PLAVIX) tablet 75 mg (has no administration in time range)  bisacodyl (DULCOLAX) EC tablet 10 mg (has no administration in time range)  aspirin EC tablet 81 mg (81 mg Oral Given 03/27/23 1726)  ascorbic acid (VITAMIN C) tablet 500 mg (500 mg Oral Given 03/27/23 1726)  acetaminophen (TYLENOL) tablet 500 mg (has no administration in time range)  oxyCODONE-acetaminophen (PERCOCET/ROXICET) 5-325 MG per tablet 1 tablet (1 tablet Oral Given 03/27/23 1721)  ondansetron (ZOFRAN-ODT) disintegrating tablet 4 mg (4 mg Oral Given 03/27/23 1721)  cefTRIAXone (ROCEPHIN) 2 g in sodium chloride 0.9 % 100 mL IVPB (0 g Intravenous Stopped 03/27/23 1909)     IMPRESSION / MDM / ASSESSMENT AND PLAN / ED COURSE  I reviewed the triage vital signs and the nursing notes.                              Differential diagnosis includes, but is not limited to, ongoing post-op pain, UTI, PNA, anemia, deconditioning, dehydration  Patient's presentation is most consistent with acute presentation with potential threat to life or bodily function.  The patient is on the cardiac monitor to evaluate for evidence of arrhythmia and/or significant heart rate changes  72 year old male with history of recent hip fracture and surgery here with generalized weakness.  Surgical site appears clean, dry, and intact and I can passively range his hip without major pain, do not suspect septic  arthritis.  Patient does have increasing leukocytosis as well as UTI with symptoms of dysuria on exam.  Suspect possible UTI.  He also had a very poor experience at the living facility that he is in, and would like to seek placement elsewhere.  Will plan to admit for treatment of his UTI and social work consultation for new placement.    FINAL CLINICAL IMPRESSION(S) /  ED DIAGNOSES   Final diagnoses:  Acute cystitis without hematuria  Right hip pain     Rx / DC Orders   ED Discharge Orders     None        Note:  This document was prepared using Dragon voice recognition software and may include unintentional dictation errors.   Shaune Pollack, MD 03/27/23 1910

## 2023-03-27 NOTE — ED Notes (Signed)
Patient transported to X-ray 

## 2023-03-27 NOTE — ED Notes (Signed)
Pt states he is Type 2 Diabetic, takes Metformin.

## 2023-03-27 NOTE — ED Notes (Signed)
This RN called the floor to see if RN is ready for patient. This RN answered inpatient RN's questions; inpatient RN now ready for patient.

## 2023-03-27 NOTE — Assessment & Plan Note (Signed)
-   The patient will be placed on supplemental coverage with NovoLog. 

## 2023-03-27 NOTE — Progress Notes (Signed)
Met with sister Zenia Resides in person at [facility].Multiple concerns about [facility]. Air conditioner smelled like urine, room had dead bugs, dressing not changed since discharged from [facility], Bloody gauze on bedside table wheel(not his bloody gauze)Was told he would have to purchase his own drinks, she believes his skin is breaking down at the wound site. Her name is Zenia Resides [phone]. States she is going to call the state about the condition of the nursing home. She stated she was told by staff at [facility] that they were owned by [facility]. Told her we were not involved in their management and not a part of [place]. She discussed concerns about his wounds worsening since discharge, and increased pain.

## 2023-03-27 NOTE — Assessment & Plan Note (Signed)
-   We will continue statin therapy. 

## 2023-03-27 NOTE — Assessment & Plan Note (Signed)
-   We will continue his antihypertensives. 

## 2023-03-27 NOTE — ED Notes (Signed)
ED TO INPATIENT HANDOFF REPORT  ED Nurse Name and Phone #: Gwenyth Bender [phone]  S Name/Age/Gender Jordan Arellano 72 y.o. male Room/Bed: ED06A/ED06A  Code Status   Code Status: Full Code  Home/SNF/Other Skilled nursing facility Patient oriented to: self, place, time, and situation Is this baseline? Yes   Triage Complete: Triage complete  Chief Complaint Generalized weakness [R53.1]  Triage Note Pt brought to ED via [facility] for Right hip pain over the last week. Pt states he recently had surgery to this hip. 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 Pt denies any falls in last week as well as no CP, SOB.    Allergies No Known Allergies  Level of Care/Admitting Diagnosis ED Disposition     ED Disposition  Admit   Condition  --   Comment  Hospital Area: [facility] [[id number]]  Level of Care: Med-Surg [16]  Covid Evaluation: Asymptomatic - no recent exposure (last 10 days) testing not required  Diagnosis: Generalized weakness [562130]  Admitting Physician: Hannah Beat [[id number]]  Attending Physician: Hannah Beat [[id number]]          B Medical/Surgery History Past Medical History:  Diagnosis Date   Basal cell carcinoma 08/11/2022   R forearm - ED&C   Depression    Foot ulcer 12/12/2016   Hyperlipidemia    Hypertension    PAD (peripheral artery disease) ~2007   s/p R BKA for non-healing wound   Stroke 03/2014   MRI: Acute nonhemorrhagic left paracentral pontine infarct. 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   TIA (transient ischemic attack) 01/2014   Past Surgical History:  Procedure Laterality Date   BELOW KNEE LEG AMPUTATION Right    FEMORAL-POPLITEAL BYPASS GRAFT Left 12/17/2016   Procedure: BYPASS LEFT FEMORAL TO BELOW POPLITEAL ARTERY USING PROPATEN GORE GRAFT;  Surgeon: Larina Earthly, MD;  Location: [facility] OR;  Service: Vascular;  Laterality: Left;   FEMORAL-POPLITEAL BYPASS GRAFT  Left 09/30/2017   Procedure: LEFT LEG ANGIOGRAM,  THROMBECTOMY, FEM-POPLITEAL BYPASS GRAFT, tHROMBECTOMY PERONEAL ARTERY AND POSTERIOR TIBIAL , ENDARTERECTOMY TIBIAL/PERONEAL TRUNK WITH BOVINE PATCH ANGIOPLASTY.;  Surgeon: Fransisco Hertz, MD;  Location: [facility] OR;  Service: Vascular;  Laterality: Left;   INTRAMEDULLARY (IM) NAIL INTERTROCHANTERIC Right 03/21/2023   Procedure: INTRAMEDULLARY (IM) NAIL INTERTROCHANTERIC;  Surgeon: Deeann Saint, MD;  Location: [facility] ORS;  Service: Orthopedics;  Laterality: Right;   LOWER EXTREMITY ANGIOGRAPHY Left 12/14/2022   Procedure: Lower Extremity Angiography;  Surgeon: Renford Dills, MD;  Location: [facility] INVASIVE CV LAB;  Service: Cardiovascular;  Laterality: Left;   PERIPHERAL VASCULAR CATHETERIZATION Left 12/14/2016   Procedure: Abdominal Aortogram w/Lower Extremity;  Surgeon: Chuck Hint, MD;  Location: [facility] INVASIVE CV LAB;  Service: Cardiovascular;  Laterality: Left;   right cataract extraction     TRANSTHORACIC ECHOCARDIOGRAM  01/2014   To evaluate possible CVA: EF 55-60%. GR 1 DD. No significant valvular lesions     A IV Location/Drains/Wounds Patient Lines/Drains/Airways Status     Active Line/Drains/Airways     Name Placement date Placement time Site Days   Peripheral IV 03/27/23 20 G Left Antecubital 03/27/23  1630  Antecubital  less than 1   Wound / Incision (Open or Dehisced) 12/16/16 Venous stasis ulcer Toe (Comment  which one) Left 3rd toe 12/16/16  0840  Toe (Comment  which one)  2292            Intake/Output Last 24 hours No  intake or output data in the 24 hours ending 03/27/23 1927  Labs/Imaging Results for orders placed or performed during the hospital encounter of 03/27/23 (from the past 48 hour(s))  CBC with Differential     Status: Abnormal   Collection Time: 03/27/23  4:31 PM  Result Value Ref Range   WBC 12.7 (H) 4.0 - 10.5 K/uL   RBC 3.41 (L) 4.22 - 5.81 MIL/uL   Hemoglobin 9.8 (L) 13.0 - 17.0 g/dL   HCT 40.9  (L) 81.1 - 52.0 %   MCV 90.3 80.0 - 100.0 fL   MCH 28.7 26.0 - 34.0 pg   MCHC 31.8 30.0 - 36.0 g/dL   RDW 91.4 78.2 - 95.6 %   Platelets 349 150 - 400 K/uL   nRBC 0.0 0.0 - 0.2 %   Neutrophils Relative % 71 %   Neutro Abs 9.2 (H) 1.7 - 7.7 K/uL   Lymphocytes Relative 17 %   Lymphs Abs 2.1 0.7 - 4.0 K/uL   Monocytes Relative 7 %   Monocytes Absolute 0.9 0.1 - 1.0 K/uL   Eosinophils Relative 3 %   Eosinophils Absolute 0.4 0.0 - 0.5 K/uL   Basophils Relative 1 %   Basophils Absolute 0.1 0.0 - 0.1 K/uL   Immature Granulocytes 1 %   Abs Immature Granulocytes 0.08 (H) 0.00 - 0.07 K/uL    Comment: Performed at [facility], [address], [place], [place] [address]  Basic metabolic panel     Status: Abnormal   Collection Time: 03/27/23  4:31 PM  Result Value Ref Range   Sodium 135 135 - 145 mmol/L   Potassium 5.4 (H) 3.5 - 5.1 mmol/L   Chloride 103 98 - 111 mmol/L   CO2 24 22 - 32 mmol/L   Glucose, Bld 195 (H) 70 - 99 mg/dL    Comment: Glucose reference range applies only to samples taken after fasting for at least 8 hours.   BUN 32 (H) 8 - 23 mg/dL   Creatinine, Ser 6.57 0.61 - 1.24 mg/dL   Calcium 9.0 8.9 - 84.6 mg/dL   GFR, Estimated >96 >29 mL/min    Comment: (NOTE) Calculated using the CKD-EPI Creatinine Equation (2021)    Anion gap 8 5 - 15    Comment: Performed at [facility], [address] [place], [place] [address]  Sedimentation rate     Status: Abnormal   Collection Time: 03/27/23  4:31 PM  Result Value Ref Range   Sed Rate 127 (H) 0 - 20 mm/hr    Comment: Performed at [facility], [address] [place], [place] [address]  Urinalysis, Routine w reflex microscopic -Urine, Clean Catch     Status: Abnormal   Collection Time: 03/27/23  6:11 PM  Result Value Ref Range   Color, Urine YELLOW (A) YELLOW   APPearance CLOUDY (A) CLEAR   Specific Gravity, Urine 1.016 1.005 - 1.030   pH 5.0 5.0 - 8.0   Glucose, UA NEGATIVE NEGATIVE mg/dL    Hgb urine dipstick NEGATIVE NEGATIVE   Bilirubin Urine NEGATIVE NEGATIVE   Ketones, ur NEGATIVE NEGATIVE mg/dL   Protein, ur 30 (A) NEGATIVE mg/dL   Nitrite NEGATIVE NEGATIVE   Leukocytes,Ua LARGE (A) NEGATIVE   RBC / HPF 0-5 0 - 5 RBC/hpf   WBC, UA >50 0 - 5 WBC/hpf   Bacteria, UA MANY (A) NONE SEEN   Squamous Epithelial / HPF NONE SEEN 0 - 5 /HPF   WBC Clumps PRESENT  Mucus PRESENT     Comment: Performed at [facility], [address] [place], [place] [address]   DG Chest Portable 1 View  Result Date: 03/27/2023 CLINICAL DATA:  Health decline following surgery, shortness of breath. Concern for infection. EXAM: PORTABLE CHEST 1 VIEW COMPARISON:  Chest radiograph 03/21/2023. FINDINGS: The cardiomediastinal silhouette is stable, with unchanged mild cardiomegaly. There is no focal consolidation or pulmonary edema. There is no pleural effusion or pneumothorax There is no acute osseous abnormality. IMPRESSION: Stable chest with no radiographic evidence of acute cardiopulmonary process. Electronically Signed   By: Lesia Hausen M.D.   On: 03/27/2023 18:20   DG Hip Unilat W or Wo Pelvis 2-3 Views Right  Result Date: 03/27/2023 CLINICAL DATA:  Right hip pain.  Status post ORIF. EXAM: DG HIP (WITH OR WITHOUT PELVIS) 3V RIGHT COMPARISON:  Right hip radiographs 03/20/2023. Intraoperative images 03/21/23. FINDINGS: ORIF of the right hip is stable. No new fractures are present. There is slight impaction of the fracture. Hardware is intact. Skin staples are in place. IMPRESSION: ORIF of the right hip without radiographic evidence for complication. Electronically Signed   By: Marin Roberts M.D.   On: 03/27/2023 17:02    Pending Labs Unresulted Labs (From admission, onward)     Start     Ordered   04/03/23 0500  Creatinine, serum  (enoxaparin (LOVENOX)    CrCl >/= 30 ml/min)  Weekly,   R     Comments: while on enoxaparin therapy    03/27/23 1911   03/28/23 0500  Basic metabolic  panel  Tomorrow morning,   R        03/27/23 1911   03/28/23 0500  CBC  Tomorrow morning,   R        03/27/23 1911   03/27/23 1909  CBC  (enoxaparin (LOVENOX)    CrCl >/= 30 ml/min)  Once,   R       Comments: Baseline for enoxaparin therapy IF NOT ALREADY DRAWN.  Notify MD if PLT < 100 K.    03/27/23 1911   03/27/23 1909  Creatinine, serum  (enoxaparin (LOVENOX)    CrCl >/= 30 ml/min)  Once,   R       Comments: Baseline for enoxaparin therapy IF NOT ALREADY DRAWN.    03/27/23 1911   03/27/23 1832  Urine Culture (for pregnant, neutropenic or urologic patients or patients with an indwelling urinary catheter)  (Urine Labs)  Once,   URGENT       Question:  Indication  Answer:  Dysuria   03/27/23 1832            Vitals/Pain Today's Vitals   03/27/23 1558 03/27/23 1559 03/27/23 1832 03/27/23 1920  BP:    137/66  Pulse:    66  Resp:    14  Temp:    97.8 F (36.6 C)  TempSrc:    Oral  SpO2:    100%  Weight:  72.6 kg    Height:   (1.854 m)    PainSc: Isolation Precautions No active isolations  Medications Medications  Vitamin D (Ergocalciferol) (DRISDOL) 1.25 MG (50000 UNIT) capsule 50,000 Units (has no administration in time range)  spironolactone (ALDACTONE) tablet 25 mg (has no administration in time range)  sertraline (ZOLOFT) tablet 100 mg (has no administration in time range)  rosuvastatin (CRESTOR) tablet 40 mg (has no administration in time range)  polyethylene glycol (MIRALAX /  GLYCOLAX) packet 17 g (0 g Oral Hold 03/27/23 1728)  irbesartan (AVAPRO) tablet 150 mg (has no administration in time range)  metFORMIN (GLUCOPHAGE) tablet 850 mg (850 mg Oral Given 03/27/23 1829)  latanoprost (XALATAN) 0.005 % ophthalmic solution 1 drop (has no administration in time range)  icosapent Ethyl (VASCEPA) 1 g capsule 2 g (has no administration in time range)  HYDROcodone-acetaminophen (NORCO/VICODIN) 5-325 MG per tablet 1-2 tablet (has no administration in time  range)  folic acid (FOLVITE) tablet 1 mg (1 mg Oral Given 03/27/23 1726)  ferrous sulfate tablet 325 mg (has no administration in time range)  cyanocobalamin (VITAMIN B12) tablet 1,000 mcg (has no administration in time range)  clopidogrel (PLAVIX) tablet 75 mg (has no administration in time range)  bisacodyl (DULCOLAX) EC tablet 10 mg (has no administration in time range)  aspirin EC tablet 81 mg (81 mg Oral Given 03/27/23 1726)  ascorbic acid (VITAMIN C) tablet 500 mg (500 mg Oral Given 03/27/23 1726)  acetaminophen (TYLENOL) tablet 500 mg (has no administration in time range)  enoxaparin (LOVENOX) injection 40 mg (has no administration in time range)  0.9 %  sodium chloride infusion (has no administration in time range)  acetaminophen (TYLENOL) tablet 650 mg (has no administration in time range)    Or  acetaminophen (TYLENOL) suppository 650 mg (has no administration in time range)  magnesium hydroxide (MILK OF MAGNESIA) suspension 30 mL (has no administration in time range)  traZODone (DESYREL) tablet 25 mg (has no administration in time range)  ondansetron (ZOFRAN) tablet 4 mg (has no administration in time range)    Or  ondansetron (ZOFRAN) injection 4 mg (has no administration in time range)  oxyCODONE-acetaminophen (PERCOCET/ROXICET) 5-325 MG per tablet 1 tablet (1 tablet Oral Given 03/27/23 1721)  ondansetron (ZOFRAN-ODT) disintegrating tablet 4 mg (4 mg Oral Given 03/27/23 1721)  cefTRIAXone (ROCEPHIN) 2 g in sodium chloride 0.9 % 100 mL IVPB (0 g Intravenous Stopped 03/27/23 1909)    Mobility walks with device     Focused Assessments Cardiac Assessment Handoff:  Cardiac Rhythm: Other (Comment) (RBBB) No results found for: "CKTOTAL", "CKMB", "CKMBINDEX", "TROPONINI" No results found for: "DDIMER" Does the Patient currently have chest pain? No    R Recommendations: See Admitting Provider Note  Report given to:   Additional Notes:

## 2023-03-27 NOTE — ED Triage Notes (Signed)
Pt brought to ED via [facility] for Right hip pain over the last week. Pt states he recently had surgery to this hip. 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 Pt denies any falls in last week as well as no CP, SOB.

## 2023-03-27 NOTE — Assessment & Plan Note (Signed)
-   Distal secondary to acute lower UTI. - She will be admitted to a medical observation bed. - We will manage his UTI with IV Rocephin and follow urine culture. - PT consult will be obtained. - Case management consult to be obtained to plan for his discharge.

## 2023-03-27 NOTE — Assessment & Plan Note (Signed)
-   We will continue Zoloft 

## 2023-03-27 NOTE — ED Notes (Signed)
Pt given urinal to void

## 2023-03-27 NOTE — Assessment & Plan Note (Signed)
-   He will be placed on IV Rocephin as mentioned above and will follow urine culture and sensitivity. - This is likely the culprit for his generalized weakness.

## 2023-03-27 NOTE — ED Notes (Signed)
Spoke with pt's sister, POA, about current facility and her wishes for him to be placed elsewhere. She said yes, would like to place him in a different facility.

## 2023-03-27 NOTE — H&P (Addendum)
PATIENT NAME: Jordan Arellano    MR#:  [id number]  DATE OF BIRTH:  Apr 07, 1951  DATE OF ADMISSION:  03/27/2023  PRIMARY CARE PHYSICIAN: Mort Sawyers, FNP   Patient is coming from:  [facility]  REQUESTING/REFERRING PHYSICIAN: Shaune Pollack, MD  CHIEF COMPLAINT:   Chief Complaint  Patient presents with   Hip Pain    HISTORY OF PRESENT ILLNESS:  Jordan Arellano is a 72 y.o. Caucasian male with medical history significant for hypertension, dyslipidemia, peripheral arterial disease, and TIA, who presented to the emergency room from [facility] with acute onset of worsening generalized weakness with ongoing right hip pain after recent ORIF after which she was discharged a couple days ago for further rehabilitation to his SNF.  He is urinary frequency and urgency with associated dysuria.  He denied any fever but had some chills.  His family believes that he did not receive appropriate care there and stated they are worried that and 6 and no chills in the room.  No cough or wheezing or dyspnea.  No chest pain or palpitations.  No nausea or vomiting or abdominal pain.  No bleeding diathesis.  ED Course: When he came to the ER, vital signs were within normal.  Labs revealed a potassium of 5.4 with a glucose of 195 and BUN of 32 and CBC showed leukocytosis 12.7 with neutrophilia and anemia better than previous levels.  UA was positive for UTI.  Urine culture was sent. EKG as reviewed by me : Showed sinus rhythm with a rate of 64 with right bundle branch block and left anterior fascicular block Imaging: Portable chest x-ray showed no acute cardiopulmonary disease.  The patient was given 1 p.o. Percocet, p.o. Zofran.  He will be admitted to an observation medical bed for further evaluation and management. PAST MEDICAL HISTORY:   Past Medical History:  Diagnosis Date   Basal cell carcinoma 08/11/2022   R forearm - ED&C   Depression    Foot ulcer  12/12/2016   Hyperlipidemia    Hypertension    PAD (peripheral artery disease) ~2007   s/p R BKA for non-healing wound   Stroke 03/2014   MRI: Acute nonhemorrhagic left paracentral pontine infarct. 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   TIA (transient ischemic attack) 01/2014    PAST SURGICAL HISTORY:   Past Surgical History:  Procedure Laterality Date   BELOW KNEE LEG AMPUTATION Right    FEMORAL-POPLITEAL BYPASS GRAFT Left 12/17/2016   Procedure: BYPASS LEFT FEMORAL TO BELOW POPLITEAL ARTERY USING PROPATEN GORE GRAFT;  Surgeon: Larina Earthly, MD;  Location: [facility] OR;  Service: Vascular;  Laterality: Left;   FEMORAL-POPLITEAL BYPASS GRAFT Left 09/30/2017   Procedure: LEFT LEG ANGIOGRAM,  THROMBECTOMY, FEM-POPLITEAL BYPASS GRAFT, tHROMBECTOMY PERONEAL ARTERY AND POSTERIOR TIBIAL , ENDARTERECTOMY TIBIAL/PERONEAL TRUNK WITH BOVINE PATCH ANGIOPLASTY.;  Surgeon: Fransisco Hertz, MD;  Location: [facility] OR;  Service: Vascular;  Laterality: Left;   INTRAMEDULLARY (IM) NAIL INTERTROCHANTERIC Right 03/21/2023   Procedure: INTRAMEDULLARY (IM) NAIL INTERTROCHANTERIC;  Surgeon: Deeann Saint, MD;  Location: [facility] ORS;  Service: Orthopedics;  Laterality: Right;   LOWER EXTREMITY ANGIOGRAPHY Left 12/14/2022   Procedure: Lower Extremity Angiography;  Surgeon: Renford Dills, MD;  Location: [facility] INVASIVE CV LAB;  Service: Cardiovascular;  Laterality: Left;   PERIPHERAL VASCULAR CATHETERIZATION Left 12/14/2016   Procedure: Abdominal Aortogram w/Lower Extremity;  Surgeon: Chuck Hint, MD;  Location: [facility] INVASIVE CV LAB;  Service: Cardiovascular;  Laterality: Left;   right cataract extraction     TRANSTHORACIC ECHOCARDIOGRAM  01/2014   To evaluate possible CVA: EF 55-60%. GR 1 DD. No significant valvular lesions    SOCIAL HISTORY:   Social History   Tobacco Use   Smoking status: Some Days    Packs/day: 15.00    Years: 60.00     Additional pack years: 0.00    Total pack years: 900.00    Types: Cigarettes   Smokeless tobacco: Never  Substance Use Topics   Alcohol use: No    FAMILY HISTORY:   Family History  Problem Relation Age of Onset   Hypertension Mother        Does not know history   Heart disease Mother    Stroke Mother    Diabetes Mother    Hypertension Father    Heart disease Father    Stroke Father    Diabetes Sister    Hypertension Sister    Heart disease Brother    Hypertension Brother     DRUG ALLERGIES:  No Known Allergies  REVIEW OF SYSTEMS:   ROS As per history of present illness. All pertinent systems were reviewed above. Constitutional, HEENT, cardiovascular, respiratory, GI, GU, musculoskeletal, neuro, psychiatric, endocrine, integumentary and hematologic systems were reviewed and are otherwise negative/unremarkable except for positive findings mentioned above in the HPI.   MEDICATIONS AT HOME:   Prior to Admission medications   Medication Sig Start Date End Date Taking? Authorizing Provider  acetaminophen (TYLENOL) 500 MG tablet Take 500 mg by mouth every 6 (six) hours as needed.   Yes [provider]  ascorbic acid (VITAMIN C) 500 MG tablet Take 1 tablet (500 mg total) by mouth daily. 03/26/23 06/24/23 Yes Gillis Santa, MD  aspirin 81 MG tablet Take 81 mg by mouth daily.   Yes [provider]  bisacodyl 5 MG EC tablet Take 2 tablets (10 mg total) by mouth daily as needed for moderate constipation. 03/25/23  Yes Gillis Santa, MD  clopidogrel (PLAVIX) 75 MG tablet Take 1 tablet (75 mg total) by mouth daily. 12/14/22  Yes Schnier, Latina Craver, MD  cyanocobalamin 1000 MCG tablet Take 1 tablet (1,000 mcg total) by mouth daily. 03/31/23 06/29/23 Yes Gillis Santa, MD  ferrous sulfate 325 (65 FE) MG tablet Take 1 tablet (325 mg total) by mouth daily with breakfast. 03/26/23 06/24/23 Yes Gillis Santa, MD  folic acid (FOLVITE) 1 MG tablet Take 1 tablet (1 mg total) by  mouth daily. 03/26/23 06/24/23 Yes Gillis Santa, MD  HYDROcodone-acetaminophen (NORCO/VICODIN) 5-325 MG tablet Take 1-2 tablets by mouth every 6 (six) hours as needed for moderate pain. 03/25/23  Yes Gillis Santa, MD  icosapent Ethyl (VASCEPA) 1 g capsule Take 2 capsules (2 g total) by mouth 2 (two) times daily. 01/27/23 07/26/23 Yes Dugal, Tabitha, FNP  latanoprost (XALATAN) 0.005 % ophthalmic solution Place 1 drop into both eyes at bedtime. 11/12/22  Yes [provider]  metFORMIN (GLUCOPHAGE) 850 MG tablet TAKE 1 TABLET BY MOUTH TWICE  DAILY WITH MEALS Patient taking differently: Take 850 mg by mouth 2 (two) times daily with a meal. 07/07/22  Yes Nche, Bonna Gains, NP  olmesartan (BENICAR) 40 MG tablet TAKE 1 TABLET BY MOUTH DAILY 07/23/22  Yes Nche, Bonna Gains, NP  polyethylene glycol (MIRALAX) 17 g packet Take 17 g by mouth daily. 03/25/23  Yes Gillis Santa, MD  rosuvastatin (CRESTOR) 40 MG tablet Take 1 tablet (  40 mg total) by mouth daily. 01/31/23  Yes Dugal, Wyatt Mage, FNP  sertraline (ZOLOFT) 100 MG tablet Take 1 tablet (100 mg total) by mouth daily. 02/11/22  Yes Nche, Bonna Gains, NP  spironolactone (ALDACTONE) 25 MG tablet Take 1 tablet (25 mg total) by mouth daily. 01/08/22  Yes Nche, Bonna Gains, NP  Vitamin D, Ergocalciferol, (DRISDOL) 1.25 MG (50000 UNIT) CAPS capsule Take 1 capsule (50,000 Units total) by mouth every 7 (seven) days. 03/31/23 06/29/23 Yes Gillis Santa, MD  blood glucose meter kit and supplies KIT Dispense based on patient and insurance preference. Use two times daily as directed (before breakfast and at bedtime. (FOR ICD-10: E11.51) 02/11/22   Nche, Bonna Gains, NP      VITAL SIGNS:  Blood pressure 128/72, pulse 67, temperature 98.7 F (37.1 C), resp. rate 19, height  (1.854 m), weight 72.6 kg, SpO2 100 %.  PHYSICAL EXAMINATION:  Physical Exam  GENERAL:  72 y.o.-year-old Caucasian male patient lying in the bed with no acute distress.  EYES: Pupils  equal, round, reactive to light and accommodation. No scleral icterus. Extraocular muscles intact.  HEENT: Head atraumatic, normocephalic. Oropharynx and nasopharynx clear.  NECK:  Supple, no jugular venous distention. No thyroid enlargement, no tenderness.  LUNGS: Normal breath sounds bilaterally, no wheezing, rales,rhonchi or crepitation. No use of accessory muscles of respiration.  CARDIOVASCULAR: Regular rate and rhythm, S1, S2 normal. No murmurs, rubs, or gallops.  ABDOMEN: Soft, nondistended, nontender. Bowel sounds present. No organomegaly or mass.  EXTREMITIES/musculoskeletal: He has right BKA.  He has an intact dressing on his right hip.  No left lower extremity edema, cyanosis, or clubbing.  NEUROLOGIC: Cranial nerves II through XII are intact. Muscle strength 5/5 in all extremities. Sensation intact. Gait not checked.  PSYCHIATRIC: The patient is alert and oriented x 3.  Normal affect and good eye contact. SKIN: No obvious rash, lesion, or ulcer.   LABORATORY PANEL:   CBC Recent Labs  Lab 03/27/23 1631  WBC 12.7*  HGB 9.8*  HCT 30.8*  PLT 349   ------------------------------------------------------------------------------------------------------------------  Chemistries  Recent Labs  Lab 03/24/23 0506 03/25/23 0545 03/27/23 1631  NA 137   < > 135  K 4.6   < > 5.4*  CL 106   < > 103  CO2 25   < > 24  GLUCOSE 191*   < > 195*  BUN 35*   < > 32*  CREATININE 1.05   < > 1.13  CALCIUM 8.6*   < > 9.0  MG 2.5*  --   --    < > = values in this interval not displayed.   ------------------------------------------------------------------------------------------------------------------  Cardiac Enzymes No results for input(s): "TROPONINI" in the last 168 hours. ------------------------------------------------------------------------------------------------------------------  RADIOLOGY:  DG Chest Portable 1 View  Result Date: 03/27/2023 CLINICAL DATA:  Health decline  following surgery, shortness of breath. Concern for infection. EXAM: PORTABLE CHEST 1 VIEW COMPARISON:  Chest radiograph 03/21/2023. FINDINGS: The cardiomediastinal silhouette is stable, with unchanged mild cardiomegaly. There is no focal consolidation or pulmonary edema. There is no pleural effusion or pneumothorax There is no acute osseous abnormality. IMPRESSION: Stable chest with no radiographic evidence of acute cardiopulmonary process. Electronically Signed   By: Lesia Hausen M.D.   On: 03/27/2023 18:20   DG Hip Unilat W or Wo Pelvis 2-3 Views Right  Result Date: 03/27/2023 CLINICAL DATA:  Right hip pain.  Status post ORIF. EXAM: DG HIP (WITH OR WITHOUT PELVIS) 3V RIGHT COMPARISON:  Right hip  radiographs 03/20/2023. Intraoperative images 03/21/23. FINDINGS: ORIF of the right hip is stable. No new fractures are present. There is slight impaction of the fracture. Hardware is intact. Skin staples are in place. IMPRESSION: ORIF of the right hip without radiographic evidence for complication. Electronically Signed   By: Marin Roberts M.D.   On: 03/27/2023 17:02      IMPRESSION AND PLAN:  Assessment and Plan: * Generalized weakness - Distal secondary to acute lower UTI. - She will be admitted to a medical observation bed. - We will manage his UTI with IV Rocephin and follow urine culture. - PT consult will be obtained. - Case management consult to be obtained to plan for his discharge.  Acute lower UTI - He will be placed on IV Rocephin as mentioned above and will follow urine culture and sensitivity. - This is likely the culprit for his generalized weakness.  Dyslipidemia - We will continue statin therapy.  Essential hypertension - We will continue his antihypertensives.  Type 2 diabetes mellitus without complications - The patient will be placed on supplemental coverage with NovoLog.  Depression - We will continue Zoloft.   DVT prophylaxis: Lovenox.  Advanced Care Planning:   Code Status: full code.  This was discussed with the patient and he clearly indicated he want  s to be resuscitated in case of cardiac arrest. Family Communication:  The plan of care was discussed in details with the patient (and family). I answered all questions. The patient agreed to proceed with the above mentioned plan. Further management will depend upon hospital course. Disposition Plan: Back to previous home environment Consults called: none.  All the records are reviewed and case discussed with ED provider.  Status is: Observation  I certify that at the time of admission, it is my clinical judgment that the patient will require hospital care extending less than 2 midnights.                            Dispo: The patient is from: Home              Anticipated d/c is to: Home              Patient currently is not medically stable to d/c.              Difficult to place patient: No  Hannah Beat M.D on 03/27/2023 at 10:10 PM  [facility]   From 7 PM-7 AM, contact night-coverage [email or web address]  CC: Primary care physician; Mort Sawyers, FNP

## 2023-03-28 ENCOUNTER — Encounter: Payer: Self-pay | Admitting: Family Medicine

## 2023-03-28 DIAGNOSIS — Z1611 Resistance to penicillins: Secondary | ICD-10-CM | POA: Diagnosis present

## 2023-03-28 DIAGNOSIS — R531 Weakness: Secondary | ICD-10-CM | POA: Diagnosis present

## 2023-03-28 DIAGNOSIS — I10 Essential (primary) hypertension: Secondary | ICD-10-CM | POA: Diagnosis present

## 2023-03-28 DIAGNOSIS — Z7902 Long term (current) use of antithrombotics/antiplatelets: Secondary | ICD-10-CM | POA: Diagnosis not present

## 2023-03-28 DIAGNOSIS — Z8249 Family history of ischemic heart disease and other diseases of the circulatory system: Secondary | ICD-10-CM | POA: Diagnosis not present

## 2023-03-28 DIAGNOSIS — Z8673 Personal history of transient ischemic attack (TIA), and cerebral infarction without residual deficits: Secondary | ICD-10-CM | POA: Diagnosis not present

## 2023-03-28 DIAGNOSIS — N390 Urinary tract infection, site not specified: Secondary | ICD-10-CM | POA: Diagnosis present

## 2023-03-28 DIAGNOSIS — B9689 Other specified bacterial agents as the cause of diseases classified elsewhere: Secondary | ICD-10-CM | POA: Diagnosis present

## 2023-03-28 DIAGNOSIS — Z85828 Personal history of other malignant neoplasm of skin: Secondary | ICD-10-CM | POA: Diagnosis not present

## 2023-03-28 DIAGNOSIS — F32A Depression, unspecified: Secondary | ICD-10-CM | POA: Diagnosis present

## 2023-03-28 DIAGNOSIS — Z823 Family history of stroke: Secondary | ICD-10-CM | POA: Diagnosis not present

## 2023-03-28 DIAGNOSIS — I452 Bifascicular block: Secondary | ICD-10-CM | POA: Diagnosis present

## 2023-03-28 DIAGNOSIS — E785 Hyperlipidemia, unspecified: Secondary | ICD-10-CM | POA: Diagnosis present

## 2023-03-28 DIAGNOSIS — M25551 Pain in right hip: Secondary | ICD-10-CM | POA: Diagnosis present

## 2023-03-28 DIAGNOSIS — Z7984 Long term (current) use of oral hypoglycemic drugs: Secondary | ICD-10-CM | POA: Diagnosis not present

## 2023-03-28 DIAGNOSIS — F17210 Nicotine dependence, cigarettes, uncomplicated: Secondary | ICD-10-CM | POA: Diagnosis present

## 2023-03-28 DIAGNOSIS — N3000 Acute cystitis without hematuria: Secondary | ICD-10-CM | POA: Diagnosis present

## 2023-03-28 DIAGNOSIS — Z7982 Long term (current) use of aspirin: Secondary | ICD-10-CM | POA: Diagnosis not present

## 2023-03-28 DIAGNOSIS — E1151 Type 2 diabetes mellitus with diabetic peripheral angiopathy without gangrene: Secondary | ICD-10-CM | POA: Diagnosis present

## 2023-03-28 DIAGNOSIS — Z89511 Acquired absence of right leg below knee: Secondary | ICD-10-CM | POA: Diagnosis not present

## 2023-03-28 DIAGNOSIS — Z79899 Other long term (current) drug therapy: Secondary | ICD-10-CM | POA: Diagnosis not present

## 2023-03-28 LAB — BASIC METABOLIC PANEL
Anion gap: 7 (ref 5–15)
BUN: 32 mg/dL — ABNORMAL HIGH (ref 8–23)
CO2: 23 mmol/L (ref 22–32)
Calcium: 8.4 mg/dL — ABNORMAL LOW (ref 8.9–10.3)
Chloride: 107 mmol/L (ref 98–111)
Creatinine, Ser: 1.07 mg/dL (ref 0.61–1.24)
GFR, Estimated: >60 mL/min (ref >60)
Glucose, Bld: 144 mg/dL — ABNORMAL HIGH (ref 70–99)
Potassium: 4.8 mmol/L (ref 3.5–5.1)
Sodium: 137 mmol/L (ref 135–145)

## 2023-03-28 LAB — GLUCOSE, CAPILLARY
Glucose-Capillary: 139 mg/dL — ABNORMAL HIGH (ref 70–99)
Glucose-Capillary: 132 mg/dL — ABNORMAL HIGH (ref 70–99)
Glucose-Capillary: 107 mg/dL — ABNORMAL HIGH (ref 70–99)
Glucose-Capillary: 153 mg/dL — ABNORMAL HIGH (ref 70–99)

## 2023-03-28 LAB — CBC
HCT: 27.3 % — ABNORMAL LOW (ref 39.0–52.0)
Hemoglobin: 8.9 g/dL — ABNORMAL LOW (ref 13.0–17.0)
MCH: 29 pg (ref 26.0–34.0)
MCHC: 32.6 g/dL (ref 30.0–36.0)
MCV: 88.9 fL (ref 80.0–100.0)
Platelets: 321 10*3/uL (ref 150–400)
RBC: 3.07 MIL/uL — ABNORMAL LOW (ref 4.22–5.81)
RDW: 15 % (ref 11.5–15.5)
WBC: 11 10*3/uL — ABNORMAL HIGH (ref 4.0–10.5)
nRBC: 0 % (ref 0.0–0.2)

## 2023-03-28 NOTE — Consult Note (Addendum)
WOC Nurse Consult Note: Consult requested for right hip wound.  Pt had surgery performed to this location on 4/15 by Dr Hyacinth Meeker of the ortho service.  Current silver/hydrocolloid dressing is beginning to lift and peel.  Removed and staples are in place and are well approximated without odor, drainage, or fluctuance.   Dressing procedure/placement/frequency: Topical treatment orders provided for bedside nurses to perform as follows to protect the site: Foam dressing to right hip post-op staples.  Change q 3 days or PRN soiling. Pt will need to follow-up with Dr Hyacinth Meeker after discharge to remove the staples and assess the post-op location. Please re-consult if further assistance is needed.  Thank-you,  Cammie Mcgee MSN, RN, CWOCN, [place], CNS [phone]

## 2023-03-28 NOTE — Evaluation (Signed)
Occupational Therapy Evaluation Patient Details Name: Jordan Arellano MRN: [id number] DOB: 1951-07-10 Today's Date: 03/28/2023   History of Present Illness Pt admitted for generalized weakness with complaints of hip pain. Pt s/p R ORIF on 03/21/23. History includes R BKA, HTN, PAD, and TIA.   Clinical Impression   Patient agreeable to OT evaluation. Sister present. Pt presenting with decreased independence in self care, balance, functional mobility/transfers, and endurance. Pt currently functioning at [facility] for bed mobility, Min A for seated LB dressing, set up A for seated grooming tasks, and Min-Mod A for simulated toilet transfer via stand pivot using a RW. Pt educated on RLE NWB and able to maintain during EOB/OOB mobility. Pt will benefit from skilled acute OT services to address deficits noted below. OT recommends ongoing therapy upon discharge to maximize safety and independence with ADLs, decrease fall risk, decrease caregiver burden, and promote return to PLOF.      Recommendations for follow up therapy are one component of a multi-disciplinary discharge planning process, led by the attending physician.  Recommendations may be updated based on patient status, additional functional criteria and insurance authorization.   Assistance Recommended at Discharge Frequent or constant Supervision/Assistance  Patient can return home with the following A lot of help with bathing/dressing/bathroom;Assistance with cooking/housework;Assist for transportation;Help with stairs or ramp for entrance;A lot of help with walking and/or transfers    Functional Status Assessment  Patient has had a recent decline in their functional status and demonstrates the ability to make significant improvements in function in a reasonable and predictable amount of time.  Equipment Recommendations  Other (comment) (defer to next venue of care)    Recommendations for Other Services       Precautions / Restrictions  Precautions Precautions: Fall Precaution Comments: history of previous R BKA Restrictions Weight Bearing Restrictions: Yes RLE Weight Bearing: Non weight bearing      Mobility Bed Mobility Overal bed mobility: Needs Assistance Bed Mobility: Supine to Sit     Supine to sit: Min guard          Transfers Overall transfer level: Needs assistance Equipment used: Rolling walker (2 wheels) Transfers: Bed to chair/wheelchair/BSC   Stand pivot transfers: Min assist, Mod assist         General transfer comment: VC for hand palcement and sequencing, assist for RW management      Balance Overall balance assessment: Needs assistance Sitting-balance support: Feet supported Sitting balance-Leahy Scale: Normal     Standing balance support: Bilateral upper extremity supported, During functional activity, Reliant on assistive device for balance Standing balance-Leahy Scale: Fair       ADL either performed or assessed with clinical judgement   ADL Overall ADL's : Needs assistance/impaired     Grooming: Set up;Sitting               Lower Body Dressing: Minimal assistance;Sitting/lateral leans Lower Body Dressing Details (indicate cue type and reason): for L sock sitting EOB Toilet Transfer: Rolling walker (2 wheels);Minimal assistance;Moderate assistance;Stand-pivot Toilet Transfer Details (indicate cue type and reason): simulated                 Vision Patient Visual Report: No change from baseline       Perception     Praxis      Pertinent Vitals/Pain Pain Assessment Pain Assessment: Faces Faces Pain Scale: Hurts a little bit Pain Location: R hip Pain Descriptors / Indicators: Aching, Grimacing Pain Intervention(s): Limited activity within patient's tolerance, Repositioned  Hand Dominance Right   Extremity/Trunk Assessment Upper Extremity Assessment Upper Extremity Assessment: Overall WFL for tasks assessed   Lower Extremity  Assessment Lower Extremity Assessment: Generalized weakness RLE Deficits / Details: R BKA   Cervical / Trunk Assessment Cervical / Trunk Assessment: Normal   Communication Communication Communication: HOH   Cognition Arousal/Alertness: Awake/alert Behavior During Therapy: WFL for tasks assessed/performed Overall Cognitive Status: Within Functional Limits for tasks assessed           General Comments: Pt oriented to self and location, grossly to time/situation. Followed commands well.     General Comments       Exercises Other Exercises Other Exercises: OT provided education re: role of OT, OT POC, post acute recs, sitting up for all meals, EOB/OOB mobility with assistance, home/fall safety, RLE NWB   Shoulder Instructions      Home Living Family/patient expects to be discharged to:: Skilled nursing facility           Additional Comments: previously at [facility] care SNF but doesn't want to return      Prior Functioning/Environment Prior Level of Function : Needs assist;History of Falls (last six months)             Mobility Comments: Mod-I with SPC and R prosthetic ADLs Comments: Pt reports independent for ADLs. Receives assistance for IADLs (Sister provides transportation, receives meals on wheels).        OT Problem List: Decreased strength;Decreased activity tolerance;Impaired balance (sitting and/or standing);Pain;Decreased knowledge of use of DME or AE;Decreased knowledge of precautions      OT Treatment/Interventions: Self-care/ADL training;Therapeutic exercise;Energy conservation;DME and/or AE instruction;Therapeutic activities;Patient/family education;Balance training    OT Goals(Current goals can be found in the care plan section) Acute Rehab OT Goals Patient Stated Goal: go to rehab OT Goal Formulation: With patient/family Time For Goal Achievement: 04/11/23 Potential to Achieve Goals: Good   OT Frequency: Min 1X/week    Co-evaluation               AM-PAC OT "6 Clicks" Daily Activity     Outcome Measure Help from another person eating meals?: None Help from another person taking care of personal grooming?: A Little Help from another person toileting, which includes using toliet, bedpan, or urinal?: A Lot Help from another person bathing (including washing, rinsing, drying)?: A Lot Help from another person to put on and taking off regular upper body clothing?: A Little Help from another person to put on and taking off regular lower body clothing?: A Lot 6 Click Score: 16   End of Session Equipment Utilized During Treatment: Gait belt;Rolling walker (2 wheels) Nurse Communication: Mobility status  Activity Tolerance: Patient tolerated treatment well Patient left: in chair;with call bell/phone within reach;with chair alarm set;with family/visitor present  OT Visit Diagnosis: Other abnormalities of gait and mobility (R26.89);Muscle weakness (generalized) (M62.81);History of falling (Z91.81);Pain Pain - Right/Left: Right Pain - part of body: Hip                Time: 1610-9604 OT Time Calculation (min): 16 min Charges:  OT General Charges $OT Visit: 1 Visit OT Evaluation $OT Eval Low Complexity: 1 Low  [facility] MS, OTR/L ascom [phone]  03/28/23, 1:32 PM

## 2023-03-28 NOTE — TOC Progression Note (Signed)
Transition of Care [facility]) - Progression Note    Patient Details  Name: Jordan Arellano MRN: [id number] Date of Birth: Feb 27, 1951  Transition of Care [facility] CM/SW Contact  Allena Katz, LCSW Phone Number: 03/28/2023, 3:08 PM  Clinical Narrative:   CSW spoke with sister to follow up with her on bed offers. Sister is going to tour facilities today and will get back to [place] on decision tomorrow          Expected Discharge Plan and Services                                               Social Determinants of Health (SDOH) Interventions SDOH Screenings   Food Insecurity: No Food Insecurity (03/28/2023)  Housing: Low Risk  (03/28/2023)  Transportation Needs: No Transportation Needs (03/28/2023)  Utilities: Not At Risk (03/28/2023)  Alcohol Screen: Low Risk  (04/07/2022)  Depression (PHQ2-9): Low Risk  (04/07/2022)  Financial Resource Strain: Low Risk  (10/08/2021)  Physical Activity: Insufficiently Active (04/07/2022)  Social Connections: Socially Isolated (04/07/2022)  Stress: No Stress Concern Present (04/07/2022)  Tobacco Use: High Risk (03/28/2023)    Readmission Risk Interventions     No data to display

## 2023-03-28 NOTE — Progress Notes (Addendum)
PROGRESS NOTE    Jordan Arellano  ZOX:[id number] DOB: May 23, 1951 DOA: 03/27/2023 PCP: Mort Sawyers, FNP    Brief Narrative:  72 y.o. Caucasian male with medical history significant for hypertension, dyslipidemia, peripheral arterial disease, and TIA, who presented to the emergency room from [facility] with acute onset of worsening generalized weakness with ongoing right hip pain after recent ORIF after which she was discharged a couple days ago for further rehabilitation to his SNF.  He is urinary frequency and urgency with associated dysuria.  He denied any fever but had some chills.  His family believes that he did not receive appropriate care there    Assessment & Plan:   Principal Problem:   Generalized weakness Active Problems:   Acute lower UTI   Essential hypertension   Dyslipidemia   Type 2 diabetes mellitus without complications   Depression  UTI Patient reports generalized weakness.  Urinalysis consistent with infection. Plan: Intravenous Rocephin IV fluids Monitor vitals and fever curve  Generalized weakness Recent orthopedic surgery Patient was discharged to [facility].  Patient and family are dissatisfied with the care there.  TOC has been consulted and we will attempt to place patient in a alternative facility.  Therapy evaluations to be requested.  Nonweightbearing right lower extremity.  Hyperlipidemia PTA statin  Essential hypertension Continue current regimen  Type 2 diabetes mellitus without complication Basal bolus regimen Carb modified diet  Depression PTA Zoloft   DVT prophylaxis: SQ lovenox Code Status: FULL Family Communication: Sister at bedside 4/22 Disposition Plan: Status is: Observation The patient will require care spanning > 2 midnights and should be moved to inpatient because: UTI on IV antibiotics   Level of care: Med-Surg  Consultants:  None  Procedures:   None  Antimicrobials: Ceftriaxone   Subjective: Sitting in bed.  No visible distress.  Somewhat flattened affect.  No pain complaints.  Objective: Vitals:   03/27/23 2116 03/28/23 0507 03/28/23 0731 03/28/23 0748  BP: 128/72 117/69 (!) 140/72 116/75  Pulse: 67 72 77   Resp: 19 20 18    Temp: 98.7 F (37.1 C) 98.6 F (37 C) 98.7 F (37.1 C)   TempSrc:   Oral   SpO2: 100% 98% 97%   Weight:      Height:        Intake/Output Summary (Last 24 hours) at 03/28/2023 1022 Last data filed at 03/28/2023 0731 Gross per 24 hour  Intake 932.67 ml  Output 100 ml  Net 832.67 ml   Filed Weights   03/27/23 1559  Weight: 72.6 kg    Examination:  General exam: Appears calm and comfortable  Respiratory system: Clear to auscultation. Respiratory effort normal. Cardiovascular system: S1-S2, RRR, no murmurs, no pedal edema Gastrointestinal system: Soft, NT/ND, normal bowel sounds Central nervous system: Alert and oriented. No focal neurological deficits. Extremities: Right lower extremity amputation Skin: No rashes, lesions or ulcers Psychiatry: Judgement and insight appear normal. Mood & affect flattened.     Data Reviewed: I have personally reviewed following labs and imaging studies  CBC: Recent Labs  Lab 03/23/23 0532 03/24/23 0506 03/25/23 0545 03/27/23 1631 03/28/23 0534  WBC 10.7* 10.4 10.9* 12.7* 11.0*  NEUTROABS  --   --   --  9.2*  --   HGB 8.2* 8.0* 8.1* 9.8* 8.9*  HCT 25.6* 24.8* 25.1* 30.8* 27.3*  MCV 89.2 88.9 89.6 90.3 88.9  PLT 213 233 245 349 321   Basic Metabolic Panel: Recent Labs  Lab 03/22/23 0551 03/23/23 0532 03/24/23  2956 03/25/23 0545 03/27/23 1631 03/28/23 0534  NA 137 135 137 135 135 137  K 4.0 4.3 4.6 4.7 5.4* 4.8  CL 105 106 106 105 103 107  CO2 GLUCOSE 151* 169* 191* 160* 195* 144*  BUN 23 31* 35* 35* 32* 32*  CREATININE 1.21 1.15 1.05 1.04 1.13 1.07  CALCIUM 8.1* 8.2* 8.6* 8.3* 9.0 8.4*  MG 2.1 2.1 2.5*  --    --   --   PHOS 2.8 3.0 3.6  --   --   --    GFR: Estimated Creatinine Clearance: 66 mL/min (by C-G formula based on SCr of 1.07 mg/dL). Liver Function Tests: No results for input(s): "AST", "ALT", "ALKPHOS", "BILITOT", "PROT", "ALBUMIN" in the last 168 hours. No results for input(s): "LIPASE", "AMYLASE" in the last 168 hours. No results for input(s): "AMMONIA" in the last 168 hours. Coagulation Profile: No results for input(s): "INR", "PROTIME" in the last 168 hours. Cardiac Enzymes: No results for input(s): "CKTOTAL", "CKMB", "CKMBINDEX", "TROPONINI" in the last 168 hours. BNP (last 3 results) No results for input(s): "PROBNP" in the last 8760 hours. HbA1C: No results for input(s): "HGBA1C" in the last 72 hours. CBG: Recent Labs  Lab 03/25/23 0759 03/25/23 1116 03/25/23 1559 03/27/23 2127 03/28/23 0828  GLUCAP 142* 285* 209* 139* 132*   Lipid Profile: No results for input(s): "CHOL", "HDL", "LDLCALC", "TRIG", "CHOLHDL", "LDLDIRECT" in the last 72 hours. Thyroid Function Tests: No results for input(s): "TSH", "T4TOTAL", "FREET4", "T3FREE", "THYROIDAB" in the last 72 hours. Anemia Panel: No results for input(s): "VITAMINB12", "FOLATE", "FERRITIN", "TIBC", "IRON", "RETICCTPCT" in the last 72 hours. Sepsis Labs: No results for input(s): "PROCALCITON", "LATICACIDVEN" in the last 168 hours.  Recent Results (from the past 240 hour(s))  Surgical PCR screen     Status: None   Collection Time: 03/21/23  3:45 AM   Specimen: Nasal Mucosa; Nasal Swab  Result Value Ref Range Status   MRSA, PCR NEGATIVE NEGATIVE Final   Staphylococcus aureus NEGATIVE NEGATIVE Final    Comment: (NOTE) The Xpert SA Assay (FDA approved for NASAL specimens in patients 71 years of age and older), is one component of a comprehensive surveillance program. It is not intended to diagnose infection nor to guide or monitor treatment. Performed at [facility], [address], [place], [place]  [address]   Urine Culture     Status: None   Collection Time: 03/22/23 11:57 AM   Specimen: Urine, Random  Result Value Ref Range Status   Specimen Description   Final    URINE, RANDOM Performed at [facility], [address] [place], [place] [address]    Special Requests   Final    NONE Reflexed from [id number] Performed at [facility], [address], [place], [place] [address]    Culture   Final    NO GROWTH Performed at [facility], [address] [place], [place] [address]    Report Status 03/23/2023 FINAL  Final         Radiology Studies: DG Chest Portable 1 View  Result Date: 03/27/2023 CLINICAL DATA:  Health decline following surgery, shortness of breath. Concern for infection. EXAM: PORTABLE CHEST 1 VIEW COMPARISON:  Chest radiograph 03/21/2023. FINDINGS: The cardiomediastinal silhouette is stable, with unchanged mild cardiomegaly. There is no focal consolidation or pulmonary edema. There is no pleural effusion or pneumothorax There is no acute osseous abnormality. IMPRESSION: Stable chest with no radiographic evidence of  acute cardiopulmonary process. Electronically Signed   By: Lesia Hausen M.D.   On: 03/27/2023 18:20   DG Hip Unilat W or Wo Pelvis 2-3 Views Right  Result Date: 03/27/2023 CLINICAL DATA:  Right hip pain.  Status post ORIF. EXAM: DG HIP (WITH OR WITHOUT PELVIS) 3V RIGHT COMPARISON:  Right hip radiographs 03/20/2023. Intraoperative images 03/21/23. FINDINGS: ORIF of the right hip is stable. No new fractures are present. There is slight impaction of the fracture. Hardware is intact. Skin staples are in place. IMPRESSION: ORIF of the right hip without radiographic evidence for complication. Electronically Signed   By: Marin Roberts M.D.   On: 03/27/2023 17:02        Scheduled Meds:  ascorbic acid  500 mg Oral Daily   aspirin EC  81 mg Oral Daily   clopidogrel  75 mg Oral Daily   [START ON 03/31/2023] cyanocobalamin  1,000  mcg Oral Daily   enoxaparin (LOVENOX) injection  40 mg Subcutaneous Q24H   ferrous sulfate  325 mg Oral Q breakfast   folic acid  1 mg Oral Daily   icosapent Ethyl  2 g Oral BID   insulin aspart  0-15 Units Subcutaneous TID WC   insulin aspart  0-5 Units Subcutaneous QHS   irbesartan  150 mg Oral Daily   latanoprost  1 drop Both Eyes QHS   metFORMIN  850 mg Oral BID WC   polyethylene glycol  17 g Oral Daily   rosuvastatin  40 mg Oral Daily   sertraline  100 mg Oral Daily   spironolactone  25 mg Oral Daily   [START ON 03/31/2023] Vitamin D (Ergocalciferol)  50,000 Units Oral Q7 days   Continuous Infusions:  sodium chloride 100 mL/hr at 03/28/23 0618   cefTRIAXone (ROCEPHIN)  IV       LOS: 0 days      Tresa Moore, MD [facility]   If 7PM-7AM, please contact night-coverage  03/28/2023, 10:22 AM  q

## 2023-03-28 NOTE — [place] FL2 (Signed)
[place] MEDICAID FL2 LEVEL OF CARE FORM     IDENTIFICATION  Patient Name: Jordan Arellano Birthdate: 1951-05-10 Sex: male Admission Date (Current Location): 03/27/2023  [facility] and [place] Number:  Chiropodist and Address:  [facility], [address] [place], [place] [address]      Provider Number: [id number]  Attending Physician Name and Address:  Tresa Moore, MD  Relative Name and Phone Number:  Halford Chessman (Sister) [phone]    Current Level of Care:   Recommended Level of Care: Skilled Nursing Facility Prior Approval Number:    Date Approved/Denied:   PASRR Number: [id number]  Discharge Plan: SNF    Current Diagnoses: Patient Active Problem List   Diagnosis Date Noted   UTI (urinary tract infection) 03/28/2023   Generalized weakness 03/27/2023   Acute lower UTI 03/27/2023   Dyslipidemia 03/27/2023   Depression 03/27/2023   Type 2 diabetes mellitus without complications 03/27/2023   Closed right hip fracture, initial encounter 03/21/2023   AKI (acute kidney injury) 03/21/2023   Accidental fall 03/21/2023   Leukocytosis 03/21/2023   At high risk for infection 01/19/2023   Conductive hearing loss of right ear 08/19/2022   Left renal artery stenosis 06/27/2020   Recurrent major depressive disorder, in full remission 06/27/2020   Atherosclerosis of native arteries of extremity with rest pain 10/01/2017   Status post femoral-popliteal bypass surgery 09/30/2017   DM (diabetes mellitus) with peripheral vascular complication 12/19/2016   Hypertriglyceridemia 10/01/2016   S/P unilateral BKA (below knee amputation), right 09/30/2016   Neuropathy 09/30/2016   PAD s/p right BKA, history revascularization left leg 09/30/2016   Phantom pain after amputation of lower extremity 09/30/2016   History of CVA (cerebrovascular accident) without residual deficits 03/25/2014   Essential hypertension 03/25/2014   Tobacco abuse  03/25/2014    Orientation RESPIRATION BLADDER Height & Weight     Self, Time, Situation, Place  Normal Continent Weight: 160 lb (72.6 kg) Height:   (185.4 cm)  BEHAVIORAL SYMPTOMS/MOOD NEUROLOGICAL BOWEL NUTRITION STATUS      Continent Diet  AMBULATORY STATUS COMMUNICATION OF NEEDS Skin   Extensive Assist Verbally  (R Hip wound)                       Personal Care Assistance Level of Assistance  Bathing, Feeding, Dressing Bathing Assistance: Limited assistance Feeding assistance: Limited assistance Dressing Assistance: Maximum assistance     Functional Limitations Info  Sight, Hearing, Speech Sight Info: Adequate Hearing Info: Adequate Speech Info: Adequate    SPECIAL CARE FACTORS FREQUENCY  PT (By licensed PT), OT (By licensed OT)     PT Frequency: 5 times a week OT Frequency: 5 times a week            Contractures Contractures Info: Not present    Additional Factors Info  Code Status, Allergies Code Status Info: FULL Allergies Info: None known currently           Current Medications (03/28/2023):  This is the current hospital active medication list Current Facility-Administered Medications  Medication Dose Route Frequency Provider Last Rate Last Admin   0.9 %  sodium chloride infusion   Intravenous Continuous Mansy, Jan A, MD 100 mL/hr at 03/28/23 0618 New Bag at 03/28/23 0618   acetaminophen (TYLENOL) tablet 650 mg  650 mg Oral Q6H PRN Mansy, Jan A, MD       Or   acetaminophen (TYLENOL) suppository 650 mg  650 mg Rectal  Q6H PRN Mansy, Vernetta Honey, MD       acetaminophen (TYLENOL) tablet 500 mg  500 mg Oral Q6H PRN Shaune Pollack, MD       ascorbic acid (VITAMIN C) tablet 500 mg  500 mg Oral Daily Shaune Pollack, MD   500 mg at 03/28/23 1030   aspirin EC tablet 81 mg  81 mg Oral Daily Shaune Pollack, MD   81 mg at 03/28/23 1032   bisacodyl (DULCOLAX) EC tablet 10 mg  10 mg Oral Daily PRN Shaune Pollack, MD       cefTRIAXone (ROCEPHIN) 1 g in sodium  chloride 0.9 % 100 mL IVPB  1 g Intravenous Q24H Mansy, Jan A, MD       clopidogrel (PLAVIX) tablet 75 mg  75 mg Oral Daily Shaune Pollack, MD   75 mg at 03/28/23 1031   [START ON 03/31/2023] cyanocobalamin (VITAMIN B12) tablet 1,000 mcg  1,000 mcg Oral Daily Shaune Pollack, MD       enoxaparin (LOVENOX) injection 40 mg  40 mg Subcutaneous Q24H Mansy, Jan A, MD   40 mg at 03/27/23 2333   ferrous sulfate tablet 325 mg  325 mg Oral Q breakfast Shaune Pollack, MD   325 mg at 03/28/23 1610   folic acid (FOLVITE) tablet 1 mg  1 mg Oral Daily Shaune Pollack, MD   1 mg at 03/28/23 1030   HYDROcodone-acetaminophen (NORCO/VICODIN) 5-325 MG per tablet 1-2 tablet  1-2 tablet Oral Q6H PRN Shaune Pollack, MD   2 tablet at 03/28/23 0516   icosapent Ethyl (VASCEPA) 1 g capsule 2 g  2 g Oral BID Shaune Pollack, MD   2 g at 03/28/23 1032   insulin aspart (novoLOG) injection 0-15 Units  0-15 Units Subcutaneous TID WC Mansy, Jan A, MD   2 Units at 03/28/23 0901   insulin aspart (novoLOG) injection 0-5 Units  0-5 Units Subcutaneous QHS Mansy, Jan A, MD       irbesartan (AVAPRO) tablet 150 mg  150 mg Oral Daily Shaune Pollack, MD   150 mg at 03/28/23 1031   latanoprost (XALATAN) 0.005 % ophthalmic solution 1 drop  1 drop Both Eyes QHS Shaune Pollack, MD   1 drop at 03/27/23 2334   magnesium hydroxide (MILK OF MAGNESIA) suspension 30 mL  30 mL Oral Daily PRN Mansy, Jan A, MD       metFORMIN (GLUCOPHAGE) tablet 850 mg  850 mg Oral BID WC Shaune Pollack, MD   850 mg at 03/28/23 0807   morphine (PF) 2 MG/ML injection 2 mg  2 mg Intravenous Q4H PRN Mansy, Jan A, MD       ondansetron [facility]) tablet 4 mg  4 mg Oral Q6H PRN Mansy, Jan A, MD       Or   ondansetron [facility]) injection 4 mg  4 mg Intravenous Q6H PRN Mansy, Jan A, MD       polyethylene glycol (MIRALAX / GLYCOLAX) packet 17 g  17 g Oral Daily Shaune Pollack, MD   17 g at 03/28/23 1028   rosuvastatin (CRESTOR) tablet 40 mg  40 mg Oral Daily Shaune Pollack, MD   40 mg at 03/28/23 1029   sertraline (ZOLOFT) tablet 100 mg  100 mg Oral Daily Shaune Pollack, MD   100 mg at 03/28/23 1031   spironolactone (ALDACTONE) tablet 25 mg  25 mg Oral Daily Shaune Pollack, MD   25 mg at 03/28/23 1030   traZODone (DESYREL) tablet 25 mg  25  mg Oral QHS PRN Mansy, Vernetta Honey, MD       [START ON 03/31/2023] Vitamin D (Ergocalciferol) (DRISDOL) 1.25 MG (50000 UNIT) capsule 50,000 Units  50,000 Units Oral Q7 days Shaune Pollack, MD         Discharge Medications: Please see discharge summary for a list of discharge medications.  Relevant Imaging Results:  Relevant Lab Results:   Additional Information [id number]  Allena Katz, LCSW

## 2023-03-28 NOTE — Evaluation (Signed)
Physical Therapy Evaluation Patient Details Name: Jordan Arellano MRN: [id number] DOB: Apr 21, 1951 Today's Date: 03/28/2023  History of Present Illness  Pt admitted for generalized weakness with complaints of hip pain. Pt s/p R ORIF on 03/21/23. History includes R BKA, HTN, PAD, and TIA.  Clinical Impression  Pt is a pleasant 72 year old male who was admitted for generalized weakness and pain. Pt performs bed mobility with cga and transfers with min assist and RW. Prosthetic in room, however too large for patient. Sister asking for referral for new prosthetics. Pt demonstrates deficits with strength/mobility. Would benefit from skilled PT to address above deficits and promote optimal return to PLOF. Pt will continue to receive skilled PT services while admitted and will defer to TOC/care team for updates regarding disposition planning.      Recommendations for follow up therapy are one component of a multi-disciplinary discharge planning process, led by the attending physician.  Recommendations may be updated based on patient status, additional functional criteria and insurance authorization.  Follow Up Recommendations Can patient physically be transported by private vehicle: No     Assistance Recommended at Discharge Intermittent Supervision/Assistance  Patient can return home with the following  A lot of help with walking and/or transfers;A little help with bathing/dressing/bathroom;Assist for transportation;Help with stairs or ramp for entrance    Equipment Recommendations None recommended by PT  Recommendations for Other Services       Functional Status Assessment Patient has had a recent decline in their functional status and demonstrates the ability to make significant improvements in function in a reasonable and predictable amount of time.     Precautions / Restrictions Precautions Precautions: Fall Restrictions Weight Bearing Restrictions: Yes RLE Weight Bearing: Non weight  bearing      Mobility  Bed Mobility Overal bed mobility: Needs Assistance Bed Mobility: Supine to Sit     Supine to sit: Min guard     General bed mobility comments: safe technique, needs cues for initiation    Transfers Overall transfer level: Needs assistance Equipment used: Rolling walker (2 wheels) Transfers: Sit to/from Stand Sit to Stand: Min assist           General transfer comment: performed multiple reps with cues for sequencing and hand placement. Once standing, needs cga to maintain standing balance.Defers transfers at this time and reports he has already been up to chair    Ambulation/Gait               General Gait Details: not safe to hop at this time  Stairs            Wheelchair Mobility    Modified Rankin (Stroke Patients Only)       Balance Overall balance assessment: Needs assistance Sitting-balance support: Feet supported Sitting balance-Leahy Scale: Normal     Standing balance support: Bilateral upper extremity supported, During functional activity, Reliant on assistive device for balance Standing balance-Leahy Scale: Fair                               Pertinent Vitals/Pain Pain Assessment Pain Assessment: Faces Faces Pain Scale: Hurts a little bit Pain Location: R hip Pain Descriptors / Indicators: Aching, Grimacing Pain Intervention(s): Limited activity within patient's tolerance, Repositioned    Home Living Family/patient expects to be discharged to:: Skilled nursing facility                   Additional Comments: previously  at [facility] care SNF but doesn't want to return    Prior Function Prior Level of Function : Needs assist;History of Falls (last six months)             Mobility Comments: Mod-I with SPC and R prosthetic ADLs Comments: Pt reports independent for ADLs. Receives assistance for IADLs (Sister provides transportation, receives meals on wheels).     Hand Dominance         Extremity/Trunk Assessment   Upper Extremity Assessment Upper Extremity Assessment: Overall WFL for tasks assessed    Lower Extremity Assessment Lower Extremity Assessment: Generalized weakness (R LE grossly 4/5)       Communication   Communication: HOH  Cognition Arousal/Alertness: Awake/alert Behavior During Therapy: WFL for tasks assessed/performed Overall Cognitive Status: Within Functional Limits for tasks assessed                                 General Comments: Pt is A and O x 3. Was able to consistently follow commands throughout        General Comments      Exercises Other Exercises Other Exercises: reviewed HEP including knee flexion, SLRs, and hip abd/add. Encouraged OOB mobility as tolerated   Assessment/Plan    PT Assessment Patient needs continued PT services  PT Problem List Decreased strength;Pain;Decreased activity tolerance;Decreased balance;Decreased mobility       PT Treatment Interventions DME instruction;Therapeutic exercise;Gait training;Balance training;Neuromuscular re-education;Functional mobility training;Therapeutic activities;Patient/family education;Stair training    PT Goals (Current goals can be found in the Care Plan section)  Acute Rehab PT Goals Patient Stated Goal: " Get stronger and get a new prosthetic" PT Goal Formulation: With patient Time For Goal Achievement: 04/11/23 Potential to Achieve Goals: Good    Frequency Min 2X/week     Co-evaluation               AM-PAC PT "6 Clicks" Mobility  Outcome Measure Help needed turning from your back to your side while in a flat bed without using bedrails?: A Little Help needed moving from lying on your back to sitting on the side of a flat bed without using bedrails?: A Little Help needed moving to and from a bed to a chair (including a wheelchair)?: A Little Help needed standing up from a chair using your arms (e.g., wheelchair or bedside chair)?: A  Little Help needed to walk in hospital room?: Total Help needed climbing 3-5 steps with a railing? : Total 6 Click Score: 14    End of Session Equipment Utilized During Treatment: Gait belt Activity Tolerance: Patient tolerated treatment well Patient left: in bed;with bed alarm set Nurse Communication: Mobility status PT Visit Diagnosis: Other abnormalities of gait and mobility (R26.89);Muscle weakness (generalized) (M62.81);History of falling (Z91.81);Difficulty in walking, not elsewhere classified (R26.2);Pain Pain - Right/Left: Right Pain - part of body: Hip    Time: 1914-7829 PT Time Calculation (min) (ACUTE ONLY): 15 min   Charges:   PT Evaluation $PT Eval Low Complexity: 1 Low          Elizabeth Palau, PT, DPT, GCS [phone]   Syanne Looney 03/28/2023, 12:21 PM

## 2023-03-28 NOTE — Telephone Encounter (Signed)
This patient has a right sided below knee ambutation.  Do you have any clue how this would be ordered for a prosthetic device? Or do you think it would be a DME order?  Below is the patient's request:  referral to [name] in [place] for a prosthetic leg for the pt? [name] states the pt is currently in the hosp for taking a fall & breaking his femur. Call back # [phone]

## 2023-03-29 DIAGNOSIS — R531 Weakness: Secondary | ICD-10-CM | POA: Diagnosis not present

## 2023-03-29 LAB — GLUCOSE, CAPILLARY
Glucose-Capillary: 110 mg/dL — ABNORMAL HIGH (ref 70–99)
Glucose-Capillary: 176 mg/dL — ABNORMAL HIGH (ref 70–99)
Glucose-Capillary: 140 mg/dL — ABNORMAL HIGH (ref 70–99)
Glucose-Capillary: 108 mg/dL — ABNORMAL HIGH (ref 70–99)

## 2023-03-29 LAB — GLUCOSE, POCT (MANUAL RESULT ENTRY): POC Glucose: 176 mg/dl — AB (ref 70–99)

## 2023-03-29 NOTE — Progress Notes (Signed)
   03/29/23 1500  Spiritual Encounters  Type of Visit Initial  Care provided to: Patient  Referral source Nurse (RN/NT/LPN)  Reason for visit Routine spiritual support  OnCall Visit Yes   Chaplain responded to nurse consult. Chaplain provbided compassionate presence and reflective listening as patient spoke about who he wanted as HCPOA. Chaplain provided paperwork and education about AD forms. Chaplain services are available for follow up as needed.

## 2023-03-29 NOTE — Telephone Encounter (Signed)
Spoke with [name](sister on DPR). [facility] [employer]. He needs to be resized because the cup is too big and is the reason why he fell. She now prefers for the prosthetic order to be sent to [facility] in [place] because that's who she has been taking to.

## 2023-03-29 NOTE — Telephone Encounter (Signed)
Please call back and let them know the below from Jordan Arellano. Have them get back to me in what the center requests. We are unsure if they require a DME order or an actual referral. They might even be able to just call and make an appt on their own.

## 2023-03-29 NOTE — Progress Notes (Signed)
PROGRESS NOTE    Jordan Arellano  WGN:[id number] DOB: 28-Sep-1951 DOA: 03/27/2023 PCP: Mort Sawyers, FNP    Brief Narrative:  72 y.o. Caucasian male with medical history significant for hypertension, dyslipidemia, peripheral arterial disease, and TIA, who presented to the emergency room from [facility] with acute onset of worsening generalized weakness with ongoing right hip pain after recent ORIF after which she was discharged a couple days ago for further rehabilitation to his SNF.  He is urinary frequency and urgency with associated dysuria.  He denied any fever but had some chills.  His family believes that he did not receive appropriate care there    Assessment & Plan:   Principal Problem:   Generalized weakness Active Problems:   Acute lower UTI   Essential hypertension   Dyslipidemia   Type 2 diabetes mellitus without complications   Depression   UTI (urinary tract infection)  UTI Patient reports generalized weakness.  Urinalysis consistent with infection. Urine culture >100 K gram-negative rods Plan: Continue IV Rocephin Discontinue IV fluids Monitor vitals and fever curve Follow cultures, de-escalate antibiotics as appropriate  Generalized weakness Recent orthopedic surgery Patient was discharged to [facility] healthcare previous hospitalization.  Patient and family are dissatisfied with the care there.  TOC has been consulted and we will attempt to place patient in a alternative facility.  Therapy evaluations to be requested.  Nonweightbearing right lower extremity.  Hyperlipidemia PTA statin  Essential hypertension Continue current regimen  Type 2 diabetes mellitus without complication Basal bolus regimen Carb modified diet  Depression PTA Zoloft   DVT prophylaxis: SQ lovenox Code Status: FULL Family Communication: Sister at bedside 4/22, 4/23 Disposition Plan: Status is: Inpatient Remains inpatient appropriate because: UTI on IV antibiotics.   Anticipate medical readiness for discharge 4/24     Level of care: Med-Surg  Consultants:  None  Procedures:  None  Antimicrobials: Ceftriaxone   Subjective: Sitting in bed.  No visible distress.  Somewhat flattened affect.  No pain complaints.  Feels well overall  Objective: Vitals:   03/28/23 1948 03/29/23 0434 03/29/23 0755 03/29/23 0815  BP: 111/68 120/74 127/66   Pulse: 71 76 80   Resp: 19 18 18    Temp:  97.8 F (36.6 C) (!) 97.5 F (36.4 C) 97.8 F (36.6 C)  TempSrc:   Oral Oral  SpO2: 99% 98% 99%   Weight:      Height:        Intake/Output Summary (Last 24 hours) at 03/29/2023 1144 Last data filed at 03/29/2023 1100 Gross per 24 hour  Intake 3045.14 ml  Output 2100 ml  Net 945.14 ml   Filed Weights   03/27/23 1559  Weight: 72.6 kg    Examination:  General exam: No acute distress Respiratory system: Lungs clear.  Normal work of breathing.  Room air Cardiovascular system: S1-S2, RRR, no murmurs, no pedal edema Gastrointestinal system: Soft, NT/ND, normal bowel sounds Central nervous system: Alert and oriented. No focal neurological deficits. Extremities: Right lower extremity amputation Skin: No rashes, lesions or ulcers Psychiatry: Judgement and insight appear normal. Mood & affect flattened.     Data Reviewed: I have personally reviewed following labs and imaging studies  CBC: Recent Labs  Lab 03/23/23 0532 03/24/23 0506 03/25/23 0545 03/27/23 1631 03/28/23 0534  WBC 10.7* 10.4 10.9* 12.7* 11.0*  NEUTROABS  --   --   --  9.2*  --   HGB 8.2* 8.0* 8.1* 9.8* 8.9*  HCT 25.6* 24.8* 25.1* 30.8* 27.3*  MCV  89.2 88.9 89.6 90.3 88.9  PLT 213 233 245 349 321   Basic Metabolic Panel: Recent Labs  Lab 03/23/23 0532 03/24/23 0506 03/25/23 0545 03/27/23 1631 03/28/23 0534  NA 135 137 135 135 137  K 4.3 4.6 4.7 5.4* 4.8  CL 106 106 105 103 107  CO2 GLUCOSE 169* 191* 160* 195* 144*  BUN 31* 35* 35* 32* 32*  CREATININE  1.15 1.05 1.04 1.13 1.07  CALCIUM 8.2* 8.6* 8.3* 9.0 8.4*  MG 2.1 2.5*  --   --   --   PHOS 3.0 3.6  --   --   --    GFR: Estimated Creatinine Clearance: 66 mL/min (by C-G formula based on SCr of 1.07 mg/dL). Liver Function Tests: No results for input(s): "AST", "ALT", "ALKPHOS", "BILITOT", "PROT", "ALBUMIN" in the last 168 hours. No results for input(s): "LIPASE", "AMYLASE" in the last 168 hours. No results for input(s): "AMMONIA" in the last 168 hours. Coagulation Profile: No results for input(s): "INR", "PROTIME" in the last 168 hours. Cardiac Enzymes: No results for input(s): "CKTOTAL", "CKMB", "CKMBINDEX", "TROPONINI" in the last 168 hours. BNP (last 3 results) No results for input(s): "PROBNP" in the last 8760 hours. HbA1C: No results for input(s): "HGBA1C" in the last 72 hours. CBG: Recent Labs  Lab 03/27/23 2127 03/28/23 0828 03/28/23 1606 03/28/23 2115 03/29/23 0735  GLUCAP 139* 132* 107* 153* 110*   Lipid Profile: No results for input(s): "CHOL", "HDL", "LDLCALC", "TRIG", "CHOLHDL", "LDLDIRECT" in the last 72 hours. Thyroid Function Tests: No results for input(s): "TSH", "T4TOTAL", "FREET4", "T3FREE", "THYROIDAB" in the last 72 hours. Anemia Panel: No results for input(s): "VITAMINB12", "FOLATE", "FERRITIN", "TIBC", "IRON", "RETICCTPCT" in the last 72 hours. Sepsis Labs: No results for input(s): "PROCALCITON", "LATICACIDVEN" in the last 168 hours.  Recent Results (from the past 240 hour(s))  Surgical PCR screen     Status: None   Collection Time: 03/21/23  3:45 AM   Specimen: Nasal Mucosa; Nasal Swab  Result Value Ref Range Status   MRSA, PCR NEGATIVE NEGATIVE Final   Staphylococcus aureus NEGATIVE NEGATIVE Final    Comment: (NOTE) The Xpert SA Assay (FDA approved for NASAL specimens in patients 42 years of age and older), is one component of a comprehensive surveillance program. It is not intended to diagnose infection nor to guide or monitor  treatment. Performed at [facility], [address] [place], [place] [address]   Urine Culture     Status: None   Collection Time: 03/22/23 11:57 AM   Specimen: Urine, Random  Result Value Ref Range Status   Specimen Description   Final    URINE, RANDOM Performed at [facility], [address], [place], [place] [address]    Special Requests   Final    NONE Reflexed from [id number] Performed at [facility], [address], [place], [place] [address]    Culture   Final    NO GROWTH Performed at [facility], [address] [place], [place] [address]    Report Status 03/23/2023 FINAL  Final  Urine Culture (for pregnant, neutropenic or urologic patients or patients with an indwelling urinary catheter)     Status: Abnormal (Preliminary result)   Collection Time: 03/27/23  6:32 PM   Specimen: Urine, Clean Catch  Result Value Ref Range Status   Specimen Description   Final    URINE, CLEAN CATCH Performed at [facility], [address] [place],  [place] [address]    Special Requests   Final    NONE Performed at [facility], [address] [place], [place] [address]    Culture (A)  Final    >=100,000 COLONIES/mL KLEBSIELLA OXYTOCA CULTURE REINCUBATED FOR BETTER GROWTH SUSCEPTIBILITIES TO FOLLOW Performed at [facility], [address] [place], [place] [address]    Report Status PENDING  Incomplete         Radiology Studies: DG Chest Portable 1 View  Result Date: 03/27/2023 CLINICAL DATA:  Health decline following surgery, shortness of breath. Concern for infection. EXAM: PORTABLE CHEST 1 VIEW COMPARISON:  Chest radiograph 03/21/2023. FINDINGS: The cardiomediastinal silhouette is stable, with unchanged mild cardiomegaly. There is no focal consolidation or pulmonary edema. There is no pleural effusion or pneumothorax There is no acute osseous abnormality. IMPRESSION: Stable chest with no radiographic evidence of  acute cardiopulmonary process. Electronically Signed   By: Lesia Hausen M.D.   On: 03/27/2023 18:20   DG Hip Unilat W or Wo Pelvis 2-3 Views Right  Result Date: 03/27/2023 CLINICAL DATA:  Right hip pain.  Status post ORIF. EXAM: DG HIP (WITH OR WITHOUT PELVIS) 3V RIGHT COMPARISON:  Right hip radiographs 03/20/2023. Intraoperative images 03/21/23. FINDINGS: ORIF of the right hip is stable. No new fractures are present. There is slight impaction of the fracture. Hardware is intact. Skin staples are in place. IMPRESSION: ORIF of the right hip without radiographic evidence for complication. Electronically Signed   By: Marin Roberts M.D.   On: 03/27/2023 17:02        Scheduled Meds:  ascorbic acid  500 mg Oral Daily   aspirin EC  81 mg Oral Daily   clopidogrel  75 mg Oral Daily   [START ON 03/31/2023] cyanocobalamin  1,000 mcg Oral Daily   enoxaparin (LOVENOX) injection  40 mg Subcutaneous Q24H   ferrous sulfate  325 mg Oral Q breakfast   folic acid  1 mg Oral Daily   icosapent Ethyl  2 g Oral BID   insulin aspart  0-15 Units Subcutaneous TID WC   insulin aspart  0-5 Units Subcutaneous QHS   irbesartan  150 mg Oral Daily   latanoprost  1 drop Both Eyes QHS   metFORMIN  850 mg Oral BID WC   polyethylene glycol  17 g Oral Daily   rosuvastatin  40 mg Oral Daily   sertraline  100 mg Oral Daily   spironolactone  25 mg Oral Daily   [START ON 03/31/2023] Vitamin D (Ergocalciferol)  50,000 Units Oral Q7 days   Continuous Infusions:  cefTRIAXone (ROCEPHIN)  IV 1 g (03/28/23 1713)     LOS: 1 day      Tresa Moore, MD [facility]   If 7PM-7AM, please contact night-coverage  03/29/2023, 11:44 AM  q

## 2023-03-29 NOTE — Plan of Care (Signed)
  Problem: Education: Goal: Verbalization of understanding the information provided (i.e., activity precautions, restrictions, etc) will improve Outcome: Progressing   Problem: Activity: Goal: Ability to ambulate and perform ADLs will improve Outcome: Progressing   Problem: Self-Concept: Goal: Ability to maintain and perform role responsibilities to the fullest extent possible will improve Outcome: Progressing   Problem: Pain Management: Goal: Pain level will decrease Outcome: Progressing   Problem: Coping: Goal: Ability to adjust to condition or change in health will improve Outcome: Progressing   Problem: Fluid Volume: Goal: Ability to maintain a balanced intake and output will improve Outcome: Progressing   Problem: Health Behavior/Discharge Planning: Goal: Ability to identify and utilize available resources and services will improve Outcome: Progressing   Problem: Metabolic: Goal: Ability to maintain appropriate glucose levels will improve Outcome: Progressing   Problem: Nutritional: Goal: Maintenance of adequate nutrition will improve Outcome: Progressing   Problem: Skin Integrity: Goal: Risk for impaired skin integrity will decrease Outcome: Progressing   Problem: Education: Goal: Knowledge of General Education information will improve Description: Including pain rating scale, medication(s)/side effects and non-pharmacologic comfort measures Outcome: Progressing   Problem: Clinical Measurements: Goal: Respiratory complications will improve Outcome: Progressing Goal: Cardiovascular complication will be avoided Outcome: Progressing   Problem: Activity: Goal: Risk for activity intolerance will decrease Outcome: Progressing   Problem: Coping: Goal: Level of anxiety will decrease Outcome: Progressing   Problem: Elimination: Goal: Will not experience complications related to bowel motility Outcome: Progressing Goal: Will not experience complications  related to urinary retention Outcome: Progressing   Problem: Pain Managment: Goal: General experience of comfort will improve Outcome: Progressing   Problem: Safety: Goal: Ability to remain free from injury will improve Outcome: Progressing   Problem: Skin Integrity: Goal: Risk for impaired skin integrity will decrease Outcome: Progressing

## 2023-03-30 ENCOUNTER — Telehealth: Payer: Self-pay | Admitting: Family

## 2023-03-30 ENCOUNTER — Telehealth: Payer: Self-pay

## 2023-03-30 ENCOUNTER — Encounter: Payer: Self-pay | Admitting: Internal Medicine

## 2023-03-30 DIAGNOSIS — N390 Urinary tract infection, site not specified: Secondary | ICD-10-CM | POA: Diagnosis not present

## 2023-03-30 DIAGNOSIS — R531 Weakness: Secondary | ICD-10-CM | POA: Diagnosis not present

## 2023-03-30 LAB — GLUCOSE, CAPILLARY
Glucose-Capillary: 157 mg/dL — ABNORMAL HIGH (ref 70–99)
Glucose-Capillary: 159 mg/dL — ABNORMAL HIGH (ref 70–99)
Glucose-Capillary: 92 mg/dL (ref 70–99)
Glucose-Capillary: 142 mg/dL — ABNORMAL HIGH (ref 70–99)

## 2023-03-30 LAB — URINE CULTURE: Culture: >=100000 — AB

## 2023-03-30 MED ORDER — CEFDINIR 300 MG PO CAPS
300.0000 mg | ORAL_CAPSULE | Freq: Two times a day (BID) | ORAL | Status: DC
  Administered 2023-03-30 – 2023-03-31 (×3): 300 mg via ORAL
  Filled 2023-03-30 (×3): qty 1

## 2023-03-30 MED ORDER — SULFAMETHOXAZOLE-TRIMETHOPRIM 800-160 MG PO TABS
1.0000 | ORAL_TABLET | Freq: Two times a day (BID) | ORAL | Status: DC
  Filled 2023-03-30: qty 1

## 2023-03-30 NOTE — Progress Notes (Signed)
Physical Therapy Treatment Patient Details Name: Jordan Arellano MRN: [id number] DOB: [birth year] Today's Date: 03/30/2023   History of Present Illness Pt admitted for generalized weakness with complaints of hip pain. Pt s/p R ORIF on 03/21/23. History includes R BKA, HTN, PAD, and TIA.    PT Comments    Pt is making good progress towards goals with ability to perform transfers between surfaces with less physical assist this date. Pt able to transfer in/out of WC, lock brakes and perform self propulsion in hallway. Pt requesting to dc home and reviewed necessary support/DME needs. Pt agreeable to sit in recliner at end of session. Update given to TOC.   Recommendations for follow up therapy are one component of a multi-disciplinary discharge planning process, led by the attending physician.  Recommendations may be updated based on patient status, additional functional criteria and insurance authorization.  Follow Up Recommendations  Can patient physically be transported by private vehicle: Yes    Assistance Recommended at Discharge Intermittent Supervision/Assistance  Patient can return home with the following A lot of help with walking and/or transfers;A lot of help with bathing/dressing/bathroom;Assist for transportation;Help with stairs or ramp for entrance   Equipment Recommendations  BSC/3in1;Rolling walker (2 wheels);Wheelchair (measurements PT)    Recommendations for Other Services       Precautions / Restrictions Precautions Precautions: Fall Restrictions Weight Bearing Restrictions: Yes RLE Weight Bearing: Non weight bearing     Mobility  Bed Mobility Overal bed mobility: Needs Assistance Bed Mobility: Supine to Sit     Supine to sit: Supervision     General bed mobility comments: safe technique. Able to come to EOB easily.    Transfers Overall transfer level: Needs assistance Equipment used: Rolling walker (2 wheels) Transfers: Bed to chair/wheelchair/BSC Sit  to Stand: Min guard Stand pivot transfers: Min guard         General transfer comment: safe technique and follows commands well. Does need cues for eccentric control during descent.    Ambulation/Gait               General Gait Details: not safe to hop at this time   Psychologist, counselling mobility: Yes Wheelchair propulsion: Both upper extremities Wheelchair parts: Supervision/cueing Distance: 200 Wheelchair Assistance Details (indicate cue type and reason): education performed with brakes and propelling WC around tight corners and through doorways. Good endurance  Modified Rankin (Stroke Patients Only)       Balance Overall balance assessment: Needs assistance Sitting-balance support: Feet supported Sitting balance-Leahy Scale: Normal     Standing balance support: Bilateral upper extremity supported, During functional activity, Reliant on assistive device for balance Standing balance-Leahy Scale: Good                              Cognition Arousal/Alertness: Awake/alert Behavior During Therapy: WFL for tasks assessed/performed Overall Cognitive Status: Within Functional Limits for tasks assessed                                 General Comments: pleasant and agreeable to session        Exercises      General Comments        Pertinent Vitals/Pain Pain Assessment Pain Assessment: No/denies pain    Home Living  Prior Function            PT Goals (current goals can now be found in the care plan section) Acute Rehab PT Goals Patient Stated Goal: " Get stronger and get a new prosthetic" PT Goal Formulation: With patient Time For Goal Achievement: 04/11/23 Potential to Achieve Goals: Good Progress towards PT goals: Progressing toward goals    Frequency    Min 2X/week      PT Plan Discharge plan needs to be updated     Co-evaluation              AM-PAC PT "6 Clicks" Mobility   Outcome Measure  Help needed turning from your back to your side while in a flat bed without using bedrails?: None Help needed moving from lying on your back to sitting on the side of a flat bed without using bedrails?: None Help needed moving to and from a bed to a chair (including a wheelchair)?: None Help needed standing up from a chair using your arms (e.g., wheelchair or bedside chair)?: A Little Help needed to walk in hospital room?: A Little Help needed climbing 3-5 steps with a railing? : A Little 6 Click Score: 21    End of Session Equipment Utilized During Treatment: Gait belt Activity Tolerance: Patient tolerated treatment well Patient left: in chair;with chair alarm set Nurse Communication: Mobility status PT Visit Diagnosis: Other abnormalities of gait and mobility (R26.89);Muscle weakness (generalized) (M62.81);History of falling (Z91.81);Difficulty in walking, not elsewhere classified (R26.2);Pain Pain - Right/Left: Right Pain - part of body: Hip     Time: 0922-0941 PT Time Calculation (min) (ACUTE ONLY): 19 min  Charges:  $Wheel Chair Management: 8-22 mins                     Elizabeth Palau, PT, DPT, GCS [phone]    Yasmene Salomone 03/30/2023, 11:05 AM

## 2023-03-30 NOTE — Telephone Encounter (Signed)
DME order placed.  Carollee Herter can you print out the order I put in? And then [facility] sister requested?

## 2023-03-30 NOTE — Progress Notes (Signed)
   03/30/23 1400  Spiritual Encounters  Type of Visit Initial  Care provided to: Patient  Referral source Chaplain team  Reason for visit Advance directives  OnCall Visit Yes   Chaplain requested to follow up on AD.

## 2023-03-30 NOTE — Progress Notes (Signed)
Progress Note    Jordan Arellano  HYQ:[id number] DOB: [birth year]  DOA: 03/27/2023 PCP: Mort Sawyers, FNP      Brief Narrative:    Medical records reviewed and are as summarized below:  Jordan Arellano is a 72 y.o. male with medical history significant for hypertension, dyslipidemia, peripheral artery disease, s/p right BKA, TIA, recent right hip surgery (IM nail intertrochanteric) on 03/21/2023 and discharged to [facility] on 03/25/2023.  He presented to the hospital with dysuria, urinary frequency and urgency and right hip pain.      Assessment/Plan:   Principal Problem:   Generalized weakness Active Problems:   Acute lower UTI   Essential hypertension   Dyslipidemia   Type 2 diabetes mellitus without complications   Depression   UTI (urinary tract infection)    Body mass index is 21.11 kg/m.    Acute UTI: Urine culture showed Klebsiella oxytoca.  Discontinue IV ceftriaxone and start cefdinir (continue for 3 more days).   Recent right hip surgery on 03/21/2023, general weakness: PT recommended discharge to SNF.  Outpatient follow-up with orthopedic surgeon in 1 to 2 weeks.   PVD with history of right BKA, TIA: Continue Plavix   Other comorbidities include type II DM, depression, hypertension, dyslipidemia   Plan discussed with the sister.  She says she prefers [employer].  Insurance authorization is pending.   Diet Order             Diet heart healthy/carb modified Room service appropriate? Yes; Fluid consistency: Thin  Diet effective now                            Consultants: None  Procedures: None    Medications:    ascorbic acid  500 mg Oral Daily   aspirin EC  81 mg Oral Daily   cefdinir  300 mg Oral Q12H   clopidogrel  75 mg Oral Daily   [START ON 03/31/2023] cyanocobalamin  1,000 mcg Oral Daily   enoxaparin (LOVENOX) injection  40 mg Subcutaneous Q24H   ferrous sulfate  325 mg Oral Q breakfast   folic acid   1 mg Oral Daily   icosapent Ethyl  2 g Oral BID   insulin aspart  0-15 Units Subcutaneous TID WC   insulin aspart  0-5 Units Subcutaneous QHS   irbesartan  150 mg Oral Daily   latanoprost  1 drop Both Eyes QHS   metFORMIN  850 mg Oral BID WC   polyethylene glycol  17 g Oral Daily   rosuvastatin  40 mg Oral Daily   sertraline  100 mg Oral Daily   spironolactone  25 mg Oral Daily   [START ON 03/31/2023] Vitamin D (Ergocalciferol)  50,000 Units Oral Q7 days   Continuous Infusions:   Anti-infectives (From admission, onward)    Start     Dose/Rate Route Frequency Ordered Stop   03/30/23 1000  sulfamethoxazole-trimethoprim (BACTRIM DS) 800-160 MG per tablet 1 tablet  Status:  Discontinued        1 tablet Oral Every 12 hours 03/30/23 0736 03/30/23 0758   03/30/23 1000  cefdinir (OMNICEF) capsule 300 mg        300 mg Oral Every 12 hours 03/30/23 0758 04/02/23 0959   03/28/23 1800  cefTRIAXone (ROCEPHIN) 1 g in sodium chloride 0.9 % 100 mL IVPB  Status:  Discontinued        1 g 200 mL/hr over 30  Minutes Intravenous Every 24 hours 03/27/23 2156 03/30/23 0736   03/27/23 1845  cefTRIAXone (ROCEPHIN) 2 g in sodium chloride 0.9 % 100 mL IVPB        2 g 200 mL/hr over 30 Minutes Intravenous  Once 03/27/23 1832 03/27/23 1909              Family Communication/Anticipated D/C date and plan/Code Status   DVT prophylaxis: enoxaparin (LOVENOX) injection 40 mg Start: 03/27/23 2200     Code Status: Full Code  Family Communication: Plan discussed with Ms. Halford Chessman, sister over the phone Disposition Plan: Plan to discharge to SNF.  Awaiting placement   Status is: Inpatient Remains inpatient appropriate because: Awaiting placement to SNF       Subjective:   Interval events noted.  No abdominal pain, vomiting, shortness of breath or chest pain  Objective:    Vitals:   03/29/23 0755 03/29/23 0815 03/29/23 1730 03/30/23 0925  BP: 127/66  135/89 121/65  Pulse: 80  70 80   Resp: Temp: (!) 97.5 F (36.4 C) 97.8 F (36.6 C) 98 F (36.7 C) 98 F (36.7 C)  TempSrc: Oral Oral    SpO2: 99%  100% 100%  Weight:      Height:       No data found.   Intake/Output Summary (Last 24 hours) at 03/30/2023 1142 Last data filed at 03/29/2023 2157 Gross per 24 hour  Intake 340 ml  Output 600 ml  Net -260 ml   Filed Weights   03/27/23 1559  Weight: 72.6 kg    Exam:  GEN: NAD SKIN: No rash EYES: EOMI ENT: MMM CV: RRR PULM: CTA B ABD: soft, ND, NT, +BS CNS: AAO x 3, non focal EXT: No edema or tenderness.  S/p right BKA        Data Reviewed:   I have personally reviewed following labs and imaging studies:  Labs: Labs show the following:   Basic Metabolic Panel: Recent Labs  Lab 03/24/23 0506 03/25/23 0545 03/27/23 1631 03/28/23 0534  NA 137 135 135 137  K 4.6 4.7 5.4* 4.8  CL 106 105 103 107  CO2 GLUCOSE 191* 160* 195* 144*  BUN 35* 35* 32* 32*  CREATININE 1.05 1.04 1.13 1.07  CALCIUM 8.6* 8.3* 9.0 8.4*  MG 2.5*  --   --   --   PHOS 3.6  --   --   --    GFR Estimated Creatinine Clearance: 66 mL/min (by C-G formula based on SCr of 1.07 mg/dL). Liver Function Tests: No results for input(s): "AST", "ALT", "ALKPHOS", "BILITOT", "PROT", "ALBUMIN" in the last 168 hours. No results for input(s): "LIPASE", "AMYLASE" in the last 168 hours. No results for input(s): "AMMONIA" in the last 168 hours. Coagulation profile No results for input(s): "INR", "PROTIME" in the last 168 hours.  CBC: Recent Labs  Lab 03/24/23 0506 03/25/23 0545 03/27/23 1631 03/28/23 0534  WBC 10.4 10.9* 12.7* 11.0*  NEUTROABS  --   --  9.2*  --   HGB 8.0* 8.1* 9.8* 8.9*  HCT 24.8* 25.1* 30.8* 27.3*  MCV 88.9 89.6 90.3 88.9  PLT 233 245 349 321   Cardiac Enzymes: No results for input(s): "CKTOTAL", "CKMB", "CKMBINDEX", "TROPONINI" in the last 168 hours. BNP (last 3 results) No results for input(s): "PROBNP" in the last 8760  hours. CBG: Recent Labs  Lab 03/29/23 0735 03/29/23 1212 03/29/23 1732 03/29/23 2117 03/30/23 1610  GLUCAP 110* 176* 140* 108* 157*   D-Dimer: No results for input(s): "DDIMER" in the last 72 hours. Hgb A1c: No results for input(s): "HGBA1C" in the last 72 hours. Lipid Profile: No results for input(s): "CHOL", "HDL", "LDLCALC", "TRIG", "CHOLHDL", "LDLDIRECT" in the last 72 hours. Thyroid function studies: No results for input(s): "TSH", "T4TOTAL", "T3FREE", "THYROIDAB" in the last 72 hours.  Invalid input(s): "FREET3" Anemia work up: No results for input(s): "VITAMINB12", "FOLATE", "FERRITIN", "TIBC", "IRON", "RETICCTPCT" in the last 72 hours. Sepsis Labs: Recent Labs  Lab 03/24/23 0506 03/25/23 0545 03/27/23 1631 03/28/23 0534  WBC 10.4 10.9* 12.7* 11.0*    Microbiology Recent Results (from the past 240 hour(s))  Surgical PCR screen     Status: None   Collection Time: 03/21/23  3:45 AM   Specimen: Nasal Mucosa; Nasal Swab  Result Value Ref Range Status   MRSA, PCR NEGATIVE NEGATIVE Final   Staphylococcus aureus NEGATIVE NEGATIVE Final    Comment: (NOTE) The Xpert SA Assay (FDA approved for NASAL specimens in patients 21 years of age and older), is one component of a comprehensive surveillance program. It is not intended to diagnose infection nor to guide or monitor treatment. Performed at [facility], [address], [place], [place] [address]   Urine Culture     Status: None   Collection Time: 03/22/23 11:57 AM   Specimen: Urine, Random  Result Value Ref Range Status   Specimen Description   Final    URINE, RANDOM Performed at [facility], [address] [place], [place] [address]    Special Requests   Final    NONE Reflexed from [id number] Performed at [facility], [address] [place], [place] [address]    Culture   Final    NO GROWTH Performed at [facility], [address] [place], [place] [address]     Report Status 03/23/2023 FINAL  Final  Urine Culture (for pregnant, neutropenic or urologic patients or patients with an indwelling urinary catheter)     Status: Abnormal   Collection Time: 03/27/23  6:32 PM   Specimen: Urine, Clean Catch  Result Value Ref Range Status   Specimen Description   Final    URINE, CLEAN CATCH Performed at [facility], [address], [place], [place] [address]    Special Requests   Final    NONE Performed at [facility], [address] [place], [place] [address]    Culture >=100,000 COLONIES/mL KLEBSIELLA OXYTOCA (A)  Final   Report Status 03/30/2023 FINAL  Final   Organism ID, Bacteria KLEBSIELLA OXYTOCA (A)  Final      Susceptibility   Klebsiella oxytoca - MIC*    AMPICILLIN RESISTANT Resistant     CEFEPIME <=0.12 SENSITIVE Sensitive     CEFTRIAXONE <=0.25 SENSITIVE Sensitive     CIPROFLOXACIN <=0.25 SENSITIVE Sensitive     GENTAMICIN <=1 SENSITIVE Sensitive     IMIPENEM <=0.25 SENSITIVE Sensitive     NITROFURANTOIN <=16 SENSITIVE Sensitive     TRIMETH/SULFA <=20 SENSITIVE Sensitive     AMPICILLIN/SULBACTAM 4 SENSITIVE Sensitive     PIP/TAZO <=4 SENSITIVE Sensitive     * >=100,000 COLONIES/mL KLEBSIELLA OXYTOCA    Procedures and diagnostic studies:  No results found.             LOS: 2 days   Oria Klimas  [facility]   Pager on [email or web address]. If 7PM-7AM, please contact night-coverage at [email or web address]     03/30/2023,  11:42 AM

## 2023-03-30 NOTE — Telephone Encounter (Signed)
Order has been printed and faxed to [facility] [phone]. Made sister aware that the order has been faxed.

## 2023-03-30 NOTE — Telephone Encounter (Signed)
Pt's sister, Glendon Axe, requested a call back from [place]. Luanne stated since the pt injured his kidney when he fell, she believes his UTI might be coming from the blood he had after the fall & due to him being on blood thinners. Call back # [phone]

## 2023-03-30 NOTE — Progress Notes (Signed)
Occupational Therapy Treatment Patient Details Name: Jordan Arellano MRN: [id number] DOB: June 07, 1951 Today's Date: 03/30/2023   History of present illness Pt admitted for generalized weakness with complaints of hip pain. Pt s/p R ORIF on 03/21/23. History includes R BKA, HTN, PAD, and TIA.   OT comments  Patient received supine in bed and agreeable to OT. Pt was able to come to EOB with supervision this date. Pt then engaged in seated grooming tasks with set up-supervision. He was able to complete lateral scooting along EOB toward R side before returning to supine with supervision. Set up A provided for use of urinal at bed level. Pt left as received with all needs in reach. Pt is making progress toward goal completion. OT will continue to follow acutely.     Recommendations for follow up therapy are one component of a multi-disciplinary discharge planning process, led by the attending physician.  Recommendations may be updated based on patient status, additional functional criteria and insurance authorization.    Assistance Recommended at Discharge Intermittent Supervision/Assistance  Patient can return home with the following  A lot of help with bathing/dressing/bathroom;Assistance with cooking/housework;Assist for transportation;Help with stairs or ramp for entrance;A lot of help with walking and/or transfers   Equipment Recommendations  BSC/3in1    Recommendations for Other Services      Precautions / Restrictions Precautions Precautions: Fall Precaution Comments: history of previous R BKA Restrictions Weight Bearing Restrictions: Yes RLE Weight Bearing: Non weight bearing       Mobility Bed Mobility Overal bed mobility: Needs Assistance Bed Mobility: Supine to Sit, Sit to Supine     Supine to sit: Supervision, HOB elevated Sit to supine: Supervision        Transfers Overall transfer level: Needs assistance Equipment used: None Transfers: Bed to chair/wheelchair/BSC             Lateral/Scoot Transfers: Supervision General transfer comment: pt completed lateral scooting along EOB toward R side before returning to supine     Balance Overall balance assessment: Needs assistance Sitting-balance support: Feet supported Sitting balance-Leahy Scale: Normal         ADL either performed or assessed with clinical judgement   ADL Overall ADL's : Needs assistance/impaired     Grooming: Set up;Sitting;Wash/dry face       Toilet Transfer: Radiographer, therapeutic Details (indicate cue type and reason): simulated with lateral scooting along EOB Toileting- Clothing Manipulation and Hygiene: Bed level;Set up Toileting - Clothing Manipulation Details (indicate cue type and reason): set up A provided for use of urinal            Extremity/Trunk Assessment Upper Extremity Assessment Upper Extremity Assessment: Overall WFL for tasks assessed   Lower Extremity Assessment Lower Extremity Assessment: Generalized weakness        Vision Patient Visual Report: No change from baseline     Perception     Praxis      Cognition Arousal/Alertness: Awake/alert Behavior During Therapy: WFL for tasks assessed/performed Overall Cognitive Status: Within Functional Limits for tasks assessed              Exercises      Shoulder Instructions       General Comments      Pertinent Vitals/ Pain       Pain Assessment Pain Assessment: No/denies pain  Home Living        Prior Functioning/Environment              Frequency  Min 1X/week  Progress Toward Goals  OT Goals(current goals can now be found in the care plan section)  Progress towards OT goals: Progressing toward goals  Acute Rehab OT Goals Patient Stated Goal: Get stronger and get a new prosthetic OT Goal Formulation: With patient/family Time For Goal Achievement: 04/11/23 Potential to Achieve Goals: Good  Plan Discharge plan needs to be  updated;Frequency remains appropriate    Co-evaluation                 AM-PAC OT "6 Clicks" Daily Activity     Outcome Measure   Help from another person eating meals?: None Help from another person taking care of personal grooming?: A Little Help from another person toileting, which includes using toliet, bedpan, or urinal?: A Little Help from another person bathing (including washing, rinsing, drying)?: A Little Help from another person to put on and taking off regular upper body clothing?: None Help from another person to put on and taking off regular lower body clothing?: A Little 6 Click Score: 20    End of Session    OT Visit Diagnosis: Other abnormalities of gait and mobility (R26.89);Muscle weakness (generalized) (M62.81);History of falling (Z91.81);Pain Pain - Right/Left: Right Pain - part of body: Hip   Activity Tolerance Patient tolerated treatment well   Patient Left in bed;with call bell/phone within reach;with bed alarm set   Nurse Communication Mobility status        Time: 1610-9604 OT Time Calculation (min): 10 min  Charges: OT General Charges $OT Visit: 1 Visit OT Treatments $Self Care/Home Management : 8-22 mins  [facility] MS, OTR/L ascom [phone]  03/30/23, 6:44 PM

## 2023-03-30 NOTE — TOC Progression Note (Signed)
Transition of Care [facility]) - Progression Note    Patient Details  Name: Jordan Arellano MRN: [id number] Date of Birth: [birth year]  Transition of Care [facility]) CM/SW Contact  Allena Katz, LCSW Phone Number: 03/30/2023, 2:32 PM  Clinical Narrative:   Berkley Harvey approved for [facility] until 4/24-4/26. Tiffany with [facility] notified.          Expected Discharge Plan and Services                                               Social Determinants of Health (SDOH) Interventions SDOH Screenings   Food Insecurity: No Food Insecurity (03/28/2023)  Housing: Low Risk  (03/28/2023)  Transportation Needs: No Transportation Needs (03/28/2023)  Utilities: Not At Risk (03/28/2023)  Alcohol Screen: Low Risk  (04/07/2022)  Depression (PHQ2-9): Low Risk  (04/07/2022)  Financial Resource Strain: Low Risk  (10/08/2021)  Physical Activity: Insufficiently Active (04/07/2022)  Social Connections: Socially Isolated (04/07/2022)  Stress: No Stress Concern Present (04/07/2022)  Tobacco Use: High Risk (03/30/2023)    Readmission Risk Interventions     No data to display

## 2023-03-30 NOTE — TOC Progression Note (Signed)
Transition of Care [facility]) - Progression Note    Patient Details  Name: Jordan Arellano MRN: [id number] Date of Birth: [birth year]  Transition of Care [facility]) CM/SW Contact  Allena Katz, LCSW Phone Number: 03/30/2023, 12:30 PM  Clinical Narrative:   [facility] for [facility].          Expected Discharge Plan and Services                                               Social Determinants of Health (SDOH) Interventions SDOH Screenings   Food Insecurity: No Food Insecurity (03/28/2023)  Housing: Low Risk  (03/28/2023)  Transportation Needs: No Transportation Needs (03/28/2023)  Utilities: Not At Risk (03/28/2023)  Alcohol Screen: Low Risk  (04/07/2022)  Depression (PHQ2-9): Low Risk  (04/07/2022)  Financial Resource Strain: Low Risk  (10/08/2021)  Physical Activity: Insufficiently Active (04/07/2022)  Social Connections: Socially Isolated (04/07/2022)  Stress: No Stress Concern Present (04/07/2022)  Tobacco Use: High Risk (03/30/2023)    Readmission Risk Interventions     No data to display

## 2023-03-30 NOTE — TOC Progression Note (Signed)
Transition of Care [facility]) - Progression Note    Patient Details  Name: Jordan Arellano MRN: [id number] Date of Birth: [birth year]  Transition of Care [facility]) CM/SW Contact  Allena Katz, LCSW Phone Number: 03/30/2023, 12:19 PM  Clinical Narrative:   CSW spoke with patient who reports he would like to go to [facility]. CSW spoke with Tiffany at [facility] to inform her. CSW to start auth.         Expected Discharge Plan and Services                                               Social Determinants of Health (SDOH) Interventions SDOH Screenings   Food Insecurity: No Food Insecurity (03/28/2023)  Housing: Low Risk  (03/28/2023)  Transportation Needs: No Transportation Needs (03/28/2023)  Utilities: Not At Risk (03/28/2023)  Alcohol Screen: Low Risk  (04/07/2022)  Depression (PHQ2-9): Low Risk  (04/07/2022)  Financial Resource Strain: Low Risk  (10/08/2021)  Physical Activity: Insufficiently Active (04/07/2022)  Social Connections: Socially Isolated (04/07/2022)  Stress: No Stress Concern Present (04/07/2022)  Tobacco Use: High Risk (03/30/2023)    Readmission Risk Interventions     No data to display

## 2023-03-30 NOTE — Addendum Note (Signed)
Addended by: Mort Sawyers on: 03/30/2023 08:14 AM   Modules accepted: Orders

## 2023-03-30 NOTE — Telephone Encounter (Signed)
Spoke with Jordan Arellano and advised that any concerns that she may have can be discussed during the video visit tomorrow. She wanted to know if Jordan Arellano has Jordan Arellano's lab results and notes from the hospital.

## 2023-03-31 ENCOUNTER — Telehealth: Payer: 59 | Admitting: Family

## 2023-03-31 ENCOUNTER — Telehealth: Payer: Self-pay

## 2023-03-31 DIAGNOSIS — N390 Urinary tract infection, site not specified: Secondary | ICD-10-CM | POA: Diagnosis not present

## 2023-03-31 DIAGNOSIS — R531 Weakness: Secondary | ICD-10-CM | POA: Diagnosis not present

## 2023-03-31 LAB — GLUCOSE, CAPILLARY: Glucose-Capillary: 132 mg/dL — ABNORMAL HIGH (ref 70–99)

## 2023-03-31 MED ORDER — CEFDINIR 300 MG PO CAPS: 300.0000 mg | ORAL_CAPSULE | Freq: Two times a day (BID) | ORAL | 0 refills | Status: AC

## 2023-03-31 MED ORDER — HYDROCODONE-ACETAMINOPHEN 5-325 MG PO TABS: 1.0000 | ORAL_TABLET | Freq: Three times a day (TID) | ORAL | 0 refills | Status: DC | PRN

## 2023-03-31 NOTE — Progress Notes (Signed)
Patient reports given to RN Amy . All questions are addressed via phone.

## 2023-03-31 NOTE — Telephone Encounter (Signed)
Per Jordan Arellano's request. She is not able to discuss any recommendations or care regarding the patient while he's under a physician's care at the hospital. He is still admitted. Patient will be going to a nursing facility after discharge and once he leaves there and goes home, then a hospital follow up can be made with Jordan Arellano. I left a message advising that this video appt while be cancelled, as there is nothing the PCP can do while he's at the hospital.

## 2023-03-31 NOTE — Discharge Summary (Addendum)
Physician Discharge Summary   Patient: Jordan Arellano MRN: [id number] DOB: 10-27-1951  Admit date:     03/27/2023  Discharge date: 03/31/23  Discharge Physician: Lurene Shadow   PCP: Mort Sawyers, FNP   Recommendations at discharge:   Follow-up with Dr. Deeann Saint, orthopedic surgeon in 2 weeks Follow up with PCP in 2 weeks  Discharge Diagnoses: Principal Problem:   Generalized weakness Active Problems:   Acute lower UTI   Essential hypertension   Dyslipidemia   Type 2 diabetes mellitus without complications   Depression   UTI (urinary tract infection)  Resolved Problems:   * No resolved hospital problems. *  Hospital Course:  Jordan Arellano is a 72 y.o. male with medical history significant for hypertension, dyslipidemia, peripheral artery disease, s/p right BKA, TIA, recent right hip surgery (IM nail intertrochanteric) on 03/21/2023 and discharged to [facility] on 03/25/2023.  He presented to the hospital with dysuria, urinary frequency and urgency and right hip pain  Assessment and Plan:  Acute UTI: Urine culture showed Klebsiella oxytoca.  He will be discharged on cefdinir   Recent right hip surgery on 03/21/2023, general weakness: PT recommended discharge to SNF.  Outpatient follow-up with Dr. Hyacinth Meeker, orthopedic surgeon in 2 weeks.     PVD with history of right BKA, TIA: Continue aspirin and Plavix     Other comorbidities include type II DM, depression, hypertension, dyslipidemia   He is doing well and he is medically stable for discharge.  Plan discussed with [name], his sister, at the bedside.     Consultants: None Procedures performed: None Disposition: Skilled nursing facility Diet recommendation:  Cardiac diet DISCHARGE MEDICATION: Allergies as of 03/31/2023   No Known Allergies      Medication List     TAKE these medications    acetaminophen 500 MG tablet Commonly known as: TYLENOL Take 500 mg by mouth every 6 (six) hours as needed.    ascorbic acid 500 MG tablet Commonly known as: VITAMIN C Take 1 tablet (500 mg total) by mouth daily.   aspirin 81 MG tablet Take 81 mg by mouth daily.   bisacodyl 5 MG EC tablet Commonly known as: bisacodyl Take 2 tablets (10 mg total) by mouth daily as needed for moderate constipation.   blood glucose meter kit and supplies Kit Dispense based on patient and insurance preference. Use two times daily as directed (before breakfast and at bedtime. (FOR ICD-10: E11.51)   cefdinir 300 MG capsule Commonly known as: OMNICEF Take 1 capsule (300 mg total) by mouth every 12 (twelve) hours for 4 doses.   clopidogrel 75 MG tablet Commonly known as: Plavix Take 1 tablet (75 mg total) by mouth daily.   cyanocobalamin 1000 MCG tablet Take 1 tablet (1,000 mcg total) by mouth daily.   ferrous sulfate 325 (65 FE) MG tablet Take 1 tablet (325 mg total) by mouth daily with breakfast.   folic acid 1 MG tablet Commonly known as: FOLVITE Take 1 tablet (1 mg total) by mouth daily.   HYDROcodone-acetaminophen 5-325 MG tablet Commonly known as: NORCO/VICODIN Take 1 tablet by mouth every 8 (eight) hours as needed for moderate pain. What changed:  how much to take when to take this   icosapent Ethyl 1 g capsule Commonly known as: Vascepa Take 2 capsules (2 g total) by mouth 2 (two) times daily.   latanoprost 0.005 % ophthalmic solution Commonly known as: XALATAN Place 1 drop into both eyes at bedtime.   metFORMIN 850 MG tablet Commonly  known as: GLUCOPHAGE TAKE 1 TABLET BY MOUTH TWICE  DAILY WITH MEALS   olmesartan 40 MG tablet Commonly known as: BENICAR TAKE 1 TABLET BY MOUTH DAILY   polyethylene glycol 17 g packet Commonly known as: MiraLax Take 17 g by mouth daily.   rosuvastatin 40 MG tablet Commonly known as: Crestor Take 1 tablet (40 mg total) by mouth daily.   sertraline 100 MG tablet Commonly known as: ZOLOFT Take 1 tablet (100 mg total) by mouth daily.    spironolactone 25 MG tablet Commonly known as: ALDACTONE Take 1 tablet (25 mg total) by mouth daily.   Vitamin D (Ergocalciferol) 1.25 MG (50000 UNIT) Caps capsule Commonly known as: DRISDOL Take 1 capsule (50,000 Units total) by mouth every 7 (seven) days.               Discharge Care Instructions  (From admission, onward)           Start     Ordered   03/31/23 0000  Discharge wound care:       Comments: Foam dressing to right hip post-op staples.  Change q 3 days or PRN soiling.  Follow-up with orthopedic surgeon in 1 to 2 weeks   03/31/23 0954            Contact information for follow-up providers     Deeann Saint, MD. Schedule an appointment as soon as possible for a visit in 1 week(s).   Specialty: Orthopedic Surgery Why: Call office for followup appointment in 1 week Contact information: [address] [place] [address] [phone]         Gilda Crease, Latina Craver, MD Follow up.   Specialties: Vascular Surgery, Cardiology, Radiology, Vascular Surgery Why: Contact office for instructions regarding prosthesis prescription and fitting Contact information: [address] [place] [address] [phone]              Contact information for after-discharge care     Destination     [facility] Preferred SNF .   Service: Skilled Nursing Contact information: [address] [place] [address] [phone]                    Discharge Exam: Ceasar Mons Weights   03/27/23 1559  Weight: 72.6 kg   GEN: NAD SKIN: Warm and dry EYES: No pallor or icterus ENT: MMM CV: RRR PULM: CTA B ABD: soft, ND, NT, +BS CNS: AAO x 3, non focal EXT: No edema or tenderness   Condition at discharge: good  The results of significant diagnostics from this hospitalization (including imaging, microbiology, ancillary and laboratory) are  listed below for reference.   Imaging Studies: DG Chest Portable 1 View  Result Date: 03/27/2023 CLINICAL DATA:  Health decline following surgery, shortness of breath. Concern for infection. EXAM: PORTABLE CHEST 1 VIEW COMPARISON:  Chest radiograph 03/21/2023. FINDINGS: The cardiomediastinal silhouette is stable, with unchanged mild cardiomegaly. There is no focal consolidation or pulmonary edema. There is no pleural effusion or pneumothorax There is no acute osseous abnormality. IMPRESSION: Stable chest with no radiographic evidence of acute cardiopulmonary process. Electronically Signed   By: Lesia Hausen M.D.   On: 03/27/2023 18:20   DG Hip Unilat W or Wo Pelvis 2-3 Views Right  Result Date: 03/27/2023 CLINICAL DATA:  Right hip pain.  Status post ORIF. EXAM: DG HIP (WITH OR WITHOUT PELVIS) 3V RIGHT COMPARISON:  Right hip radiographs  03/20/2023. Intraoperative images 03/21/23. FINDINGS: ORIF of the right hip is stable. No new fractures are present. There is slight impaction of the fracture. Hardware is intact. Skin staples are in place. IMPRESSION: ORIF of the right hip without radiographic evidence for complication. Electronically Signed   By: Marin Roberts M.D.   On: 03/27/2023 17:02   DG HIP UNILAT WITH PELVIS 2-3 VIEWS RIGHT  Result Date: 03/21/2023 CLINICAL DATA:  Fracture. EXAM: DG HIP (WITH OR WITHOUT PELVIS) 2-3V RIGHT COMPARISON:  Radiograph yesterday FINDINGS: Four fluoroscopic spot views of the right hip obtained in the operating room. Interval intramedullary nail with distal locking and trans trochanteric screw fixation of proximal femur fracture. Fluoroscopy time 38 seconds. Dose 4.89 mGy. IMPRESSION: Procedural fluoroscopy during proximal femur fracture ORIF. Electronically Signed   By: Narda Rutherford M.D.   On: 03/21/2023 14:46   DG C-Arm 1-60 Min-No Report  Result Date: 03/21/2023 Fluoroscopy was utilized by the requesting physician.  No radiographic interpretation.   DG  Chest Port 1 View  Result Date: 03/21/2023 CLINICAL DATA:  Right hip fracture, preop chest radiograph EXAM: PORTABLE CHEST 1 VIEW COMPARISON:  07/21/2022 FINDINGS: Lungs are clear.  No pleural effusion or pneumothorax. The heart is normal in size. IMPRESSION: No acute cardiopulmonary disease. Electronically Signed   By: Charline Bills M.D.   On: 03/21/2023 00:47   DG Hip Unilat  With Pelvis 2-3 Views Right  Result Date: 03/20/2023 CLINICAL DATA:  Trauma, tripped landing on right hip. EXAM: DG HIP (WITH OR WITHOUT PELVIS) 2-3V RIGHT COMPARISON:  None Available. FINDINGS: There is a nondisplaced fracture of the femoral neck. Femoral head is seated in the acetabulum. Mild osteoarthritis of both hips. Bony pelvis and pubic rami are intact. IMPRESSION: Nondisplaced right femoral neck fracture. Electronically Signed   By: Narda Rutherford M.D.   On: 03/20/2023 23:36    Microbiology: Results for orders placed or performed during the hospital encounter of 03/27/23  Urine Culture (for pregnant, neutropenic or urologic patients or patients with an indwelling urinary catheter)     Status: Abnormal   Collection Time: 03/27/23  6:32 PM   Specimen: Urine, Clean Catch  Result Value Ref Range Status   Specimen Description   Final    URINE, CLEAN CATCH Performed at [facility], [address] [place], [place] [address]    Special Requests   Final    NONE Performed at [facility], [address] [place], [place] [address]    Culture >=100,000 COLONIES/mL KLEBSIELLA OXYTOCA (A)  Final   Report Status 03/30/2023 FINAL  Final   Organism ID, Bacteria KLEBSIELLA OXYTOCA (A)  Final      Susceptibility   Klebsiella oxytoca - MIC*    AMPICILLIN RESISTANT Resistant     CEFEPIME <=0.12 SENSITIVE Sensitive     CEFTRIAXONE <=0.25 SENSITIVE Sensitive     CIPROFLOXACIN <=0.25 SENSITIVE Sensitive     GENTAMICIN <=1 SENSITIVE Sensitive     IMIPENEM <=0.25 SENSITIVE Sensitive      NITROFURANTOIN <=16 SENSITIVE Sensitive     TRIMETH/SULFA <=20 SENSITIVE Sensitive     AMPICILLIN/SULBACTAM 4 SENSITIVE Sensitive     PIP/TAZO <=4 SENSITIVE Sensitive     * >=100,000 COLONIES/mL KLEBSIELLA OXYTOCA    Labs: CBC: Recent Labs  Lab 03/25/23 0545 03/27/23 1631 03/28/23 0534  WBC 10.9* 12.7* 11.0*  NEUTROABS  --  9.2*  --   HGB 8.1* 9.8* 8.9*  HCT 25.1* 30.8* 27.3*  MCV 89.6 90.3 88.9  PLT  245 349 321   Basic Metabolic Panel: Recent Labs  Lab 03/25/23 0545 03/27/23 1631 03/28/23 0534  NA 135 135 137  K 4.7 5.4* 4.8  CL 105 103 107  CO2 GLUCOSE 160* 195* 144*  BUN 35* 32* 32*  CREATININE 1.04 1.13 1.07  CALCIUM 8.3* 9.0 8.4*   Liver Function Tests: No results for input(s): "AST", "ALT", "ALKPHOS", "BILITOT", "PROT", "ALBUMIN" in the last 168 hours. CBG: Recent Labs  Lab 03/30/23 0923 03/30/23 1155 03/30/23 1549 03/30/23 2005 03/31/23 0743  GLUCAP 157* 159* 92 142* 132*    Discharge time spent: greater than 30 minutes.  Signed: Lurene Shadow, MD [facility] 03/31/2023

## 2023-03-31 NOTE — Care Management Important Message (Signed)
Important Message  Patient Details  Name: Jordan Arellano MRN: [id number] Date of Birth: May 04, 1951   Medicare Important Message Given:  N/A - LOS <3 / Initial given by admissions     Olegario Messier A Effa Yarrow 03/31/2023, 8:06 AM

## 2023-03-31 NOTE — TOC Transition Note (Signed)
Transition of Care [facility]) - CM/SW Discharge Note   Patient Details  Name: Jordan Arellano MRN: [id number] Date of Birth: March 14, 1951  Transition of Care [facility]) CM/SW Contact:  Allena Katz, LCSW Phone Number: 03/31/2023, 9:59 AM   Clinical Narrative:   Pt has [facility] in [facility]. [facility] notified. DC summary sent. Medical necessity printed to unit. Pt is third on the list. Facility notified. CSW signing off.           Patient Goals and CMS Choice      Discharge Placement                         Discharge Plan and Services Additional resources added to the After Visit Summary for                                       Social Determinants of Health (SDOH) Interventions SDOH Screenings   Food Insecurity: No Food Insecurity (03/28/2023)  Housing: Low Risk  (03/28/2023)  Transportation Needs: No Transportation Needs (03/28/2023)  Utilities: Not At Risk (03/28/2023)  Alcohol Screen: Low Risk  (04/07/2022)  Depression (PHQ2-9): Low Risk  (04/07/2022)  Financial Resource Strain: Low Risk  (10/08/2021)  Physical Activity: Insufficiently Active (04/07/2022)  Social Connections: Socially Isolated (04/07/2022)  Stress: No Stress Concern Present (04/07/2022)  Tobacco Use: High Risk (03/30/2023)     Readmission Risk Interventions     No data to display

## 2023-04-03 ENCOUNTER — Other Ambulatory Visit: Payer: Self-pay | Admitting: Nurse Practitioner

## 2023-04-03 DIAGNOSIS — F3342 Major depressive disorder, recurrent, in full remission: Secondary | ICD-10-CM

## 2023-04-05 ENCOUNTER — Ambulatory Visit: Payer: Self-pay

## 2023-04-05 NOTE — Transitions of Care (Post Inpatient/ED Visit) (Signed)
   04/05/2023  Name: Koda Routon MRN: [id number] DOB: [birth year]  Today's TOC FU Call Status:    Attempted to reach the patient regarding the most recent Inpatient/ED visit.  Follow Up Plan: No further outreach attempts will be made at this time. We have been unable to contact the patient. error Signature Karena Addison, LPN [facility] Nurse Health Advisor Direct Dial [phone]

## 2023-04-05 NOTE — Chronic Care Management (AMB) (Signed)
   04/05/2023  Jordan Arellano 08-15-1951 [id number]   Reason for Encounter: Patient is not currently enrolled in the CCM program. CCM status changed to previously enrolled  Alto Denver RN, MSN, CCM RN Care Manager  Chronic Care Management Direct Number: [phone]

## 2023-04-15 ENCOUNTER — Telehealth: Payer: Self-pay | Admitting: Family

## 2023-04-15 NOTE — Telephone Encounter (Signed)
Home Health verbal orders Caller Name: Clydie Braun Agency Name: [facility]  Callback number: [phone]  Clydie Braun called in and stated that Maleki is discharging from [facility] on Sunday and have orders to continue [facility] PT and OT. She was wanting to know if Tabitha would follow. Thank you!  Please forward to [facility] pool or providers CMA

## 2023-04-18 ENCOUNTER — Ambulatory Visit (INDEPENDENT_AMBULATORY_CARE_PROVIDER_SITE_OTHER): Payer: 59 | Admitting: Internal Medicine

## 2023-04-18 ENCOUNTER — Encounter: Payer: Self-pay | Admitting: Internal Medicine

## 2023-04-18 VITALS — BP 100/58 | HR 90 | Temp 97.2°F | Ht 73.0 in | Wt 142.0 lb

## 2023-04-18 DIAGNOSIS — R8290 Unspecified abnormal findings in urine: Secondary | ICD-10-CM

## 2023-04-18 DIAGNOSIS — E441 Mild protein-calorie malnutrition: Secondary | ICD-10-CM

## 2023-04-18 LAB — POC URINALSYSI DIPSTICK (AUTOMATED)
Bilirubin, UA: NEGATIVE
Blood, UA: NEGATIVE
Glucose, UA: NEGATIVE
Ketones, UA: NEGATIVE
Nitrite, UA: NEGATIVE
Protein, UA: POSITIVE — AB
Spec Grav, UA: 1.025 (ref 1.010–1.025)
Urobilinogen, UA: 0.2 E.U./dL
pH, UA: 5.5 (ref 5.0–8.0)

## 2023-04-18 NOTE — Telephone Encounter (Signed)
Called and spoke with Clydie Braun. Provided the verbal orders.

## 2023-04-18 NOTE — Assessment & Plan Note (Addendum)
He lost about 10% of his weight Now is eating better Sister will get him some ensure

## 2023-04-18 NOTE — Telephone Encounter (Signed)
I'm assuming this means would I sign off plan of care?  If this is the case, then yes I will follow.

## 2023-04-18 NOTE — Assessment & Plan Note (Signed)
No true symptoms of infection Did have 2 different courses of antibiotics----last ended a week ago or so Discussed increasing fluids--could be just concentrated (SG 1.025) 1+ leuks--but will just check culture and only treat if positive

## 2023-04-18 NOTE — Progress Notes (Signed)
Subjective:    Patient ID: Jordan Arellano, male    DOB: [birth year], 72 y.o.   MRN: [id number]  HPI Here with sister due to appetite loss and urinary changes  Fractured right hip 4/18 Had it pinned and then went to rehab the next week---[facility] Then back to hospital --bad experience Had UTI and was treated Then went to [employer] [facility] yesterday Lives along--has in home health starting tomorrow PT and OT and maybe speech (for cognitive)  Sister wanted him seen today  Urine is dark and smells fishy She had heard something about a damaged kidney He concurs No dysuria---no trouble voiding but not much Doesn't note increased frequency  Appetite is off---but starting to improve Lost weight  Current Outpatient Medications on File Prior to Visit  Medication Sig Dispense Refill   acetaminophen (TYLENOL) 500 MG tablet Take 500 mg by mouth every 6 (six) hours as needed.     ascorbic acid (VITAMIN C) 500 MG tablet Take 1 tablet (500 mg total) by mouth daily. 90 tablet 0   aspirin 81 MG tablet Take 81 mg by mouth daily.     bisacodyl 5 MG EC tablet Take 2 tablets (10 mg total) by mouth daily as needed for moderate constipation. 30 tablet 0   blood glucose meter kit and supplies KIT Dispense based on patient and insurance preference. Use two times daily as directed (before breakfast and at bedtime. (FOR ICD-10: E11.51) 1 each 0   clopidogrel (PLAVIX) 75 MG tablet Take 1 tablet (75 mg total) by mouth daily. 30 tablet 5   cyanocobalamin 1000 MCG tablet Take 1 tablet (1,000 mcg total) by mouth daily. 90 tablet 0   ferrous sulfate 325 (65 FE) MG tablet Take 1 tablet (325 mg total) by mouth daily with breakfast. 90 tablet 0   folic acid (FOLVITE) 1 MG tablet Take 1 tablet (1 mg total) by mouth daily. 90 tablet 0   icosapent Ethyl (VASCEPA) 1 g capsule Take 2 capsules (2 g total) by mouth 2 (two) times daily. 360 capsule 1   latanoprost (XALATAN) 0.005 % ophthalmic solution  Place 1 drop into both eyes at bedtime.     metFORMIN (GLUCOPHAGE) 850 MG tablet TAKE 1 TABLET BY MOUTH TWICE  DAILY WITH MEALS (Patient taking differently: Take 850 mg by mouth 2 (two) times daily with a meal.) 200 tablet 2   olmesartan (BENICAR) 40 MG tablet TAKE 1 TABLET BY MOUTH DAILY 100 tablet 3   rosuvastatin (CRESTOR) 40 MG tablet Take 1 tablet (40 mg total) by mouth daily. 90 tablet 3   sertraline (ZOLOFT) 100 MG tablet Take 1 tablet (100 mg total) by mouth daily. 90 tablet 3   spironolactone (ALDACTONE) 25 MG tablet Take 1 tablet (25 mg total) by mouth daily. 90 tablet 1   Vitamin D, Ergocalciferol, (DRISDOL) 1.25 MG (50000 UNIT) CAPS capsule Take 1 capsule (50,000 Units total) by mouth every 7 (seven) days. 12 capsule 0   HYDROcodone-acetaminophen (NORCO/VICODIN) 5-325 MG tablet Take 1 tablet by mouth every 8 (eight) hours as needed for moderate pain. (Patient not taking: Reported on 04/18/2023) 10 tablet 0   polyethylene glycol (MIRALAX) 17 g packet Take 17 g by mouth daily. (Patient not taking: Reported on 04/18/2023) 14 each 0   No current facility-administered medications on file prior to visit.    No Known Allergies  Past Medical History:  Diagnosis Date   Basal cell carcinoma 08/11/2022   R forearm - ED&C  Depression    Foot ulcer (HCC) 12/12/2016   Hyperlipidemia    Hypertension    PAD (peripheral artery disease) (HCC) ~2007   s/p R BKA for non-healing wound   Stroke (HCC) 03/2014   MRI: Acute nonhemorrhagic left paracentral pontine infarct. 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   TIA (transient ischemic attack) 01/2014    Past Surgical History:  Procedure Laterality Date   BELOW KNEE LEG AMPUTATION Right    FEMORAL-POPLITEAL BYPASS GRAFT Left 12/17/2016   Procedure: BYPASS LEFT FEMORAL TO BELOW POPLITEAL ARTERY USING PROPATEN GORE GRAFT;  Surgeon: Larina Earthly, MD;  Location[facility] OR;   Service: Vascular;  Laterality: Left;   FEMORAL-POPLITEAL BYPASS GRAFT Left 09/30/2017   Procedure: LEFT LEG ANGIOGRAM,  THROMBECTOMY, FEM-POPLITEAL BYPASS GRAFT, tHROMBECTOMY PERONEAL ARTERY AND POSTERIOR TIBIAL , ENDARTERECTOMY TIBIAL/PERONEAL TRUNK WITH BOVINE PATCH ANGIOPLASTY.;  Surgeon: Fransisco Hertz, MD;  Location: [facility] OR;  Service: Vascular;  Laterality: Left;   INTRAMEDULLARY (IM) NAIL INTERTROCHANTERIC Right 03/21/2023   Procedure: INTRAMEDULLARY (IM) NAIL INTERTROCHANTERIC;  Surgeon: Deeann Saint, MD;  Location: [facility] ORS;  Service: Orthopedics;  Laterality: Right;   LOWER EXTREMITY ANGIOGRAPHY Left 12/14/2022   Procedure: Lower Extremity Angiography;  Surgeon: Renford Dills, MD;  Location: [facility] INVASIVE CV LAB;  Service: Cardiovascular;  Laterality: Left;   PERIPHERAL VASCULAR CATHETERIZATION Left 12/14/2016   Procedure: Abdominal Aortogram w/Lower Extremity;  Surgeon: Chuck Hint, MD;  Location: [facility] INVASIVE CV LAB;  Service: Cardiovascular;  Laterality: Left;   right cataract extraction     TRANSTHORACIC ECHOCARDIOGRAM  01/2014   To evaluate possible CVA: EF 55-60%. GR 1 DD. No significant valvular lesions    Family History  Problem Relation Age of Onset   Hypertension Mother        Does not know history   Heart disease Mother    Stroke Mother    Diabetes Mother    Hypertension Father    Heart disease Father    Stroke Father    Diabetes Sister    Hypertension Sister    Heart disease Brother    Hypertension Brother     Social History   Socioeconomic History   Marital status: Widowed    Spouse name: Not on file   Number of children: 2   Years of education: Not on file   Highest education level: Not on file  Occupational History    Comment: Disabled  Tobacco Use   Smoking status: Some Days    Packs/day: 15.00    Years: 60.00    Additional pack years: 0.00    Total pack years: 900.00    Types: Cigarettes   Smokeless tobacco: Never  Vaping Use    Vaping Use: Never used  Substance and Sexual Activity   Alcohol use: No   Drug use: No   Sexual activity: Not Currently    Birth control/protection: Abstinence  Other Topics Concern   Not on file  Social History Narrative   One boy youngest, killed in MVA    Social Determinants of Health   Financial Resource Strain: Low Risk  (10/08/2021)   Overall Financial Resource Strain (CARDIA)    Difficulty of Paying Living Expenses: Not hard at all  Food Insecurity: No Food Insecurity (03/28/2023)   Hunger Vital Sign    Worried About Running Out of Food in the Last Year: Never true    Ran Out of Food in the Last Year:  Never true  Transportation Needs: No Transportation Needs (03/28/2023)   PRAPARE - Administrator, Civil Service (Medical): No    Lack of Transportation (Non-Medical): No  Physical Activity: Insufficiently Active (04/07/2022)   Exercise Vital Sign    Days of Exercise per Week: 2 days    Minutes of Exercise per Session: 30 min  Stress: No Stress Concern Present (04/07/2022)   [employer] of Occupational Health - Occupational Stress Questionnaire    Feeling of Stress : Not at all  Social Connections: Socially Isolated (04/07/2022)   Social Connection and Isolation Panel [NHANES]    Frequency of Communication with Friends and Family: Three times a week    Frequency of Social Gatherings with Friends and Family: Three times a week    Attends Religious Services: Never    Active Member of Clubs or Organizations: No    Attends Banker Meetings: Never    Marital Status: Widowed  Intimate Partner Violence: Not At Risk (03/28/2023)   Humiliation, Afraid, Rape, and Kick questionnaire    Fear of Current or Ex-Partner: No    Emotionally Abused: No    Physically Abused: No    Sexually Abused: No   Review of Systems No fever Did finish antibiotic for infection     Objective:   Physical Exam Constitutional:      Appearance: Normal appearance.  Abdominal:      Palpations: Abdomen is soft.     Tenderness: There is no abdominal tenderness. There is no right CVA tenderness or left CVA tenderness.  Neurological:     Mental Status: He is alert.     Comments: Fairly stable gait with prosthesis and walker            Assessment & Plan:

## 2023-04-19 ENCOUNTER — Telehealth: Payer: Self-pay | Admitting: Family

## 2023-04-19 LAB — URINE CULTURE
MICRO NUMBER:: 14947849
SPECIMEN QUALITY:: ADEQUATE

## 2023-04-19 NOTE — Telephone Encounter (Signed)
Called Vernona Rieger and gave verbal orders.

## 2023-04-19 NOTE — Telephone Encounter (Signed)
Spoke with [name] (sister/DPR) and she has concerns about his cognitive abilities. Do she need to bring him in for a visit/ schedule a virtual/ or refer him to neurology?

## 2023-04-19 NOTE — Telephone Encounter (Signed)
Patient sister called in and wanted to speak with you in regards to [facility] cognitive abilities. She can be reached at [phone]. Thank you!

## 2023-04-19 NOTE — Telephone Encounter (Signed)
Home Health verbal orders Caller Name:LAURA Agency Name: [facility]  Callback number: [phone]  Requesting OT/PT/Skilled nursing/Social Work/Speech: PT  Reason: HIP FRACTURE  Frequency:2WK 2 1WK2 EVERY OTHER WK 4 WK  Please forward to [facility] pool or providers CMA

## 2023-04-20 NOTE — Telephone Encounter (Signed)
Scheduled patient to be seen on 04/25/23.

## 2023-04-20 NOTE — Telephone Encounter (Signed)
Make appt as this is new.

## 2023-04-25 ENCOUNTER — Encounter: Payer: Self-pay | Admitting: Family

## 2023-04-25 ENCOUNTER — Ambulatory Visit (INDEPENDENT_AMBULATORY_CARE_PROVIDER_SITE_OTHER): Payer: 59 | Admitting: Family

## 2023-04-25 VITALS — BP 110/65 | HR 105 | Temp 97.6°F | Ht 73.0 in | Wt 144.0 lb

## 2023-04-25 DIAGNOSIS — Z7984 Long term (current) use of oral hypoglycemic drugs: Secondary | ICD-10-CM

## 2023-04-25 DIAGNOSIS — R700 Elevated erythrocyte sedimentation rate: Secondary | ICD-10-CM

## 2023-04-25 DIAGNOSIS — E119 Type 2 diabetes mellitus without complications: Secondary | ICD-10-CM

## 2023-04-25 DIAGNOSIS — G3184 Mild cognitive impairment, so stated: Secondary | ICD-10-CM

## 2023-04-25 DIAGNOSIS — I10 Essential (primary) hypertension: Secondary | ICD-10-CM

## 2023-04-25 NOTE — Assessment & Plan Note (Signed)
Stable  Continue metformin 850 mg twice daily

## 2023-04-25 NOTE — Patient Instructions (Addendum)
  Would recommend daily b12 1000 mcg once daily.   A referral was placed today for neurology.  Please let us know if you have not heard back within 2 weeks about the referral.   Regards,   Rhanda Lemire FNP-C
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
## 2023-04-25 NOTE — Assessment & Plan Note (Signed)
Assessed with MMSE today in office, along with clinical concerns from sister will refer to neurology to evaluate further. Pt does not drive.

## 2023-04-25 NOTE — Assessment & Plan Note (Signed)
Decrease olmesartan to 20 mg once daily as blood pressure on lower end of normal x 3 Reduce risk of falls is goal

## 2023-04-26 ENCOUNTER — Telehealth: Payer: Self-pay

## 2023-04-26 NOTE — Telephone Encounter (Signed)
HH orders to be signed from [employer]. Placed in Tabitha's box.

## 2023-04-27 ENCOUNTER — Telehealth: Payer: Self-pay | Admitting: Family

## 2023-04-27 NOTE — Telephone Encounter (Signed)
HH nurse Almedia Balls  from [facility] called requesting a phone call to review patient medications,to make sure patient is suppose to be on a few of the medications that he has in his possession,and he is needing a refill on 2 but she would like to know if he's actually suppose to be taking them.
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
## 2023-04-27 NOTE — Telephone Encounter (Signed)
Spoke with Almedia Balls and went over medications.

## 2023-04-27 NOTE — Telephone Encounter (Signed)
Signed and placed in outbox

## 2023-04-27 NOTE — Telephone Encounter (Signed)
Forms faxed back to HH
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
## 2023-04-28 NOTE — Telephone Encounter (Signed)
  Yes, Jordan Arellano's contact number is [phone]

## 2023-04-28 NOTE — Telephone Encounter (Signed)
Is there a way for [place] to get contact information for [facility] Colletta [place], the social worker? I'd like to call her to discuss this further.

## 2023-05-03 ENCOUNTER — Encounter: Payer: Self-pay | Admitting: Physician Assistant

## 2023-05-04 ENCOUNTER — Ambulatory Visit: Payer: 59 | Admitting: Family

## 2023-05-04 ENCOUNTER — Other Ambulatory Visit: Payer: Self-pay | Admitting: Nurse Practitioner

## 2023-05-04 DIAGNOSIS — I10 Essential (primary) hypertension: Secondary | ICD-10-CM

## 2023-05-05 ENCOUNTER — Telehealth: Payer: Self-pay | Admitting: Family

## 2023-05-05 ENCOUNTER — Other Ambulatory Visit: Payer: Self-pay | Admitting: Nurse Practitioner

## 2023-05-05 DIAGNOSIS — F3342 Major depressive disorder, recurrent, in full remission: Secondary | ICD-10-CM

## 2023-05-05 DIAGNOSIS — E1151 Type 2 diabetes mellitus with diabetic peripheral angiopathy without gangrene: Secondary | ICD-10-CM

## 2023-05-05 DIAGNOSIS — I10 Essential (primary) hypertension: Secondary | ICD-10-CM

## 2023-05-05 NOTE — Telephone Encounter (Signed)
Pt's sister, [name], called stating the pt received a text from [facility] stating that [place] didn't approve the pt's medication refills. I asked [name] for the names of the meds that weren't approved for refills. [name] stated she couldn't remember the names & would call pharmacy then call our office back. Call back # [phone]

## 2023-05-05 NOTE — Telephone Encounter (Signed)
Looks like the meds are being requested by [place], Bonna Gains, NP

## 2023-05-06 NOTE — Telephone Encounter (Signed)
Pt is no longer under prescribers care.

## 2023-05-09 ENCOUNTER — Ambulatory Visit: Payer: 59 | Admitting: Podiatry

## 2023-05-09 MED ORDER — OLMESARTAN MEDOXOMIL 40 MG PO TABS
40.0000 mg | ORAL_TABLET | Freq: Every day | ORAL | 3 refills | Status: DC
Start: 2023-05-09 — End: 2023-05-30

## 2023-05-09 MED ORDER — SPIRONOLACTONE 25 MG PO TABS
25.0000 mg | ORAL_TABLET | Freq: Every day | ORAL | 3 refills | Status: DC
Start: 2023-05-09 — End: 2023-05-30

## 2023-05-09 MED ORDER — METFORMIN HCL 850 MG PO TABS
850.0000 mg | ORAL_TABLET | Freq: Two times a day (BID) | ORAL | 3 refills | Status: DC
Start: 2023-05-09 — End: 2023-06-03

## 2023-05-09 MED ORDER — SERTRALINE HCL 100 MG PO TABS
100.0000 mg | ORAL_TABLET | Freq: Every day | ORAL | 3 refills | Status: DC
Start: 2023-05-09 — End: 2024-03-07

## 2023-05-09 NOTE — Addendum Note (Signed)
Addended by: Mort Sawyers on: 05/09/2023 05:25 AM   Modules accepted: Orders

## 2023-05-09 NOTE — Telephone Encounter (Signed)
Refills sent to [facility]  

## 2023-05-18 ENCOUNTER — Encounter: Payer: Self-pay | Admitting: Internal Medicine

## 2023-05-18 ENCOUNTER — Inpatient Hospital Stay: Payer: 59

## 2023-05-18 ENCOUNTER — Telehealth: Payer: Self-pay | Admitting: Family

## 2023-05-18 ENCOUNTER — Telehealth (INDEPENDENT_AMBULATORY_CARE_PROVIDER_SITE_OTHER): Payer: Self-pay | Admitting: Nurse Practitioner

## 2023-05-18 ENCOUNTER — Other Ambulatory Visit: Payer: Self-pay

## 2023-05-18 ENCOUNTER — Emergency Department: Payer: 59

## 2023-05-18 ENCOUNTER — Inpatient Hospital Stay
Admission: EM | Admit: 2023-05-18 | Discharge: 2023-06-03 | DRG: 239 | Disposition: A | Discharge status: Skilled Nursing Facility | Payer: 59 | Attending: Internal Medicine | Admitting: Internal Medicine

## 2023-05-18 DIAGNOSIS — E1151 Type 2 diabetes mellitus with diabetic peripheral angiopathy without gangrene: Principal | ICD-10-CM | POA: Diagnosis present

## 2023-05-18 DIAGNOSIS — I701 Atherosclerosis of renal artery: Secondary | ICD-10-CM | POA: Diagnosis present

## 2023-05-18 DIAGNOSIS — Z823 Family history of stroke: Secondary | ICD-10-CM

## 2023-05-18 DIAGNOSIS — E781 Pure hyperglyceridemia: Secondary | ICD-10-CM | POA: Diagnosis present

## 2023-05-18 DIAGNOSIS — L97509 Non-pressure chronic ulcer of other part of unspecified foot with unspecified severity: Secondary | ICD-10-CM | POA: Diagnosis present

## 2023-05-18 DIAGNOSIS — I739 Peripheral vascular disease, unspecified: Secondary | ICD-10-CM | POA: Diagnosis present

## 2023-05-18 DIAGNOSIS — I5032 Chronic diastolic (congestive) heart failure: Secondary | ICD-10-CM | POA: Diagnosis present

## 2023-05-18 DIAGNOSIS — Z751 Person awaiting admission to adequate facility elsewhere: Secondary | ICD-10-CM

## 2023-05-18 DIAGNOSIS — Z85828 Personal history of other malignant neoplasm of skin: Secondary | ICD-10-CM

## 2023-05-18 DIAGNOSIS — Z7902 Long term (current) use of antithrombotics/antiplatelets: Secondary | ICD-10-CM

## 2023-05-18 DIAGNOSIS — Z7984 Long term (current) use of oral hypoglycemic drugs: Secondary | ICD-10-CM | POA: Diagnosis not present

## 2023-05-18 DIAGNOSIS — Z8249 Family history of ischemic heart disease and other diseases of the circulatory system: Secondary | ICD-10-CM

## 2023-05-18 DIAGNOSIS — M86072 Acute hematogenous osteomyelitis, left ankle and foot: Secondary | ICD-10-CM | POA: Insufficient documentation

## 2023-05-18 DIAGNOSIS — Z8673 Personal history of transient ischemic attack (TIA), and cerebral infarction without residual deficits: Secondary | ICD-10-CM

## 2023-05-18 DIAGNOSIS — E876 Hypokalemia: Secondary | ICD-10-CM | POA: Diagnosis present

## 2023-05-18 DIAGNOSIS — I5030 Unspecified diastolic (congestive) heart failure: Secondary | ICD-10-CM | POA: Insufficient documentation

## 2023-05-18 DIAGNOSIS — Z79899 Other long term (current) drug therapy: Secondary | ICD-10-CM

## 2023-05-18 DIAGNOSIS — I10 Essential (primary) hypertension: Secondary | ICD-10-CM | POA: Diagnosis present

## 2023-05-18 DIAGNOSIS — Z716 Tobacco abuse counseling: Secondary | ICD-10-CM

## 2023-05-18 DIAGNOSIS — I7025 Atherosclerosis of native arteries of other extremities with ulceration: Secondary | ICD-10-CM | POA: Diagnosis present

## 2023-05-18 DIAGNOSIS — E1169 Type 2 diabetes mellitus with other specified complication: Secondary | ICD-10-CM | POA: Diagnosis present

## 2023-05-18 DIAGNOSIS — N179 Acute kidney failure, unspecified: Secondary | ICD-10-CM | POA: Diagnosis not present

## 2023-05-18 DIAGNOSIS — F3342 Major depressive disorder, recurrent, in full remission: Secondary | ICD-10-CM | POA: Diagnosis present

## 2023-05-18 DIAGNOSIS — Z7982 Long term (current) use of aspirin: Secondary | ICD-10-CM

## 2023-05-18 DIAGNOSIS — Z1330 Encounter for screening examination for mental health and behavioral disorders, unspecified: Secondary | ICD-10-CM | POA: Diagnosis not present

## 2023-05-18 DIAGNOSIS — I70245 Atherosclerosis of native arteries of left leg with ulceration of other part of foot: Secondary | ICD-10-CM | POA: Diagnosis not present

## 2023-05-18 DIAGNOSIS — E43 Unspecified severe protein-calorie malnutrition: Secondary | ICD-10-CM | POA: Diagnosis not present

## 2023-05-18 DIAGNOSIS — A419 Sepsis, unspecified organism: Secondary | ICD-10-CM | POA: Diagnosis not present

## 2023-05-18 DIAGNOSIS — M869 Osteomyelitis, unspecified: Principal | ICD-10-CM

## 2023-05-18 DIAGNOSIS — I70222 Atherosclerosis of native arteries of extremities with rest pain, left leg: Secondary | ICD-10-CM

## 2023-05-18 DIAGNOSIS — R6521 Severe sepsis with septic shock: Secondary | ICD-10-CM | POA: Diagnosis not present

## 2023-05-18 DIAGNOSIS — I110 Hypertensive heart disease with heart failure: Secondary | ICD-10-CM | POA: Diagnosis present

## 2023-05-18 DIAGNOSIS — Z681 Body mass index (BMI) 19 or less, adult: Secondary | ICD-10-CM

## 2023-05-18 DIAGNOSIS — Z95828 Presence of other vascular implants and grafts: Secondary | ICD-10-CM

## 2023-05-18 DIAGNOSIS — Z720 Tobacco use: Secondary | ICD-10-CM | POA: Diagnosis present

## 2023-05-18 DIAGNOSIS — E1140 Type 2 diabetes mellitus with diabetic neuropathy, unspecified: Secondary | ICD-10-CM | POA: Diagnosis present

## 2023-05-18 DIAGNOSIS — F325 Major depressive disorder, single episode, in full remission: Secondary | ICD-10-CM | POA: Diagnosis present

## 2023-05-18 DIAGNOSIS — Z89511 Acquired absence of right leg below knee: Secondary | ICD-10-CM | POA: Diagnosis not present

## 2023-05-18 DIAGNOSIS — E11621 Type 2 diabetes mellitus with foot ulcer: Secondary | ICD-10-CM | POA: Diagnosis present

## 2023-05-18 DIAGNOSIS — F17210 Nicotine dependence, cigarettes, uncomplicated: Secondary | ICD-10-CM | POA: Diagnosis present

## 2023-05-18 DIAGNOSIS — D638 Anemia in other chronic diseases classified elsewhere: Secondary | ICD-10-CM | POA: Diagnosis present

## 2023-05-18 DIAGNOSIS — E785 Hyperlipidemia, unspecified: Secondary | ICD-10-CM | POA: Diagnosis present

## 2023-05-18 DIAGNOSIS — M009 Pyogenic arthritis, unspecified: Secondary | ICD-10-CM | POA: Diagnosis present

## 2023-05-18 DIAGNOSIS — E119 Type 2 diabetes mellitus without complications: Secondary | ICD-10-CM

## 2023-05-18 DIAGNOSIS — I77819 Aortic ectasia, unspecified site: Secondary | ICD-10-CM | POA: Diagnosis not present

## 2023-05-18 DIAGNOSIS — I708 Atherosclerosis of other arteries: Secondary | ICD-10-CM | POA: Diagnosis not present

## 2023-05-18 DIAGNOSIS — L089 Local infection of the skin and subcutaneous tissue, unspecified: Secondary | ICD-10-CM | POA: Diagnosis not present

## 2023-05-18 DIAGNOSIS — L97529 Non-pressure chronic ulcer of other part of left foot with unspecified severity: Secondary | ICD-10-CM | POA: Diagnosis not present

## 2023-05-18 DIAGNOSIS — F32A Depression, unspecified: Secondary | ICD-10-CM | POA: Diagnosis present

## 2023-05-18 DIAGNOSIS — I70201 Unspecified atherosclerosis of native arteries of extremities, right leg: Secondary | ICD-10-CM | POA: Diagnosis not present

## 2023-05-18 DIAGNOSIS — Z833 Family history of diabetes mellitus: Secondary | ICD-10-CM

## 2023-05-18 DIAGNOSIS — Z9189 Other specified personal risk factors, not elsewhere classified: Secondary | ICD-10-CM

## 2023-05-18 LAB — COMPREHENSIVE METABOLIC PANEL
ALT: 11 U/L (ref 0–44)
AST: 19 U/L (ref 15–41)
Albumin: 3.8 g/dL (ref 3.5–5.0)
Alkaline Phosphatase: 68 U/L (ref 38–126)
Anion gap: 10 (ref 5–15)
BUN: 27 mg/dL — ABNORMAL HIGH (ref 8–23)
CO2: 19 mmol/L — ABNORMAL LOW (ref 22–32)
Calcium: 8 mg/dL — ABNORMAL LOW (ref 8.9–10.3)
Chloride: 112 mmol/L — ABNORMAL HIGH (ref 98–111)
Creatinine, Ser: 1.24 mg/dL (ref 0.61–1.24)
GFR, Estimated: >60 mL/min (ref >60)
Glucose, Bld: 122 mg/dL — ABNORMAL HIGH (ref 70–99)
Potassium: 4.8 mmol/L (ref 3.5–5.1)
Sodium: 141 mmol/L (ref 135–145)
Total Bilirubin: 0.1 mg/dL — ABNORMAL LOW (ref 0.3–1.2)
Total Protein: 7.6 g/dL (ref 6.5–8.1)

## 2023-05-18 LAB — CBC WITH DIFFERENTIAL/PLATELET
Abs Immature Granulocytes: 0.04 10*3/uL (ref 0.00–0.07)
Basophils Absolute: 0.1 10*3/uL (ref 0.0–0.1)
Basophils Relative: 1 %
Eosinophils Absolute: 0.3 10*3/uL (ref 0.0–0.5)
Eosinophils Relative: 3 %
HCT: 33.5 % — ABNORMAL LOW (ref 39.0–52.0)
Hemoglobin: 10.6 g/dL — ABNORMAL LOW (ref 13.0–17.0)
Immature Granulocytes: 0 %
Lymphocytes Relative: 26 %
Lymphs Abs: 3 10*3/uL (ref 0.7–4.0)
MCH: 29.7 pg (ref 26.0–34.0)
MCHC: 31.6 g/dL (ref 30.0–36.0)
MCV: 93.8 fL (ref 80.0–100.0)
Monocytes Absolute: 0.6 10*3/uL (ref 0.1–1.0)
Monocytes Relative: 6 %
Neutro Abs: 7.4 10*3/uL (ref 1.7–7.7)
Neutrophils Relative %: 64 %
Platelets: 274 10*3/uL (ref 150–400)
RBC: 3.57 MIL/uL — ABNORMAL LOW (ref 4.22–5.81)
RDW: 16.4 % — ABNORMAL HIGH (ref 11.5–15.5)
WBC: 11.4 10*3/uL — ABNORMAL HIGH (ref 4.0–10.5)
nRBC: 0 % (ref 0.0–0.2)

## 2023-05-18 LAB — APTT: aPTT: 36 seconds (ref 24–36)

## 2023-05-18 LAB — PROTIME-INR
INR: 1.1 (ref 0.8–1.2)
Prothrombin Time: 14.3 seconds (ref 11.4–15.2)

## 2023-05-18 LAB — CBG MONITORING, ED
Glucose-Capillary: 104 mg/dL — ABNORMAL HIGH (ref 70–99)
Glucose-Capillary: 164 mg/dL — ABNORMAL HIGH (ref 70–99)

## 2023-05-18 LAB — HEPARIN LEVEL (UNFRACTIONATED): Heparin Unfractionated: 0.3 IU/mL (ref 0.30–0.70)

## 2023-05-18 LAB — D-DIMER, QUANTITATIVE: D-Dimer, Quant: 1.53 ug/mL-FEU — ABNORMAL HIGH (ref 0.00–0.50)

## 2023-05-18 MED ORDER — HYDRALAZINE HCL 20 MG/ML IJ SOLN: 5.0000 mg | Freq: Four times a day (QID) | INTRAMUSCULAR | Status: AC | PRN

## 2023-05-18 MED ORDER — VANCOMYCIN HCL 1500 MG/300ML IV SOLN
1500.0000 mg | Freq: Once | INTRAVENOUS | Status: AC
  Administered 2023-05-18: 1500 mg via INTRAVENOUS
  Filled 2023-05-18: qty 300

## 2023-05-18 MED ORDER — NICOTINE 14 MG/24HR TD PT24: 14.0000 mg | MEDICATED_PATCH | Freq: Every day | TRANSDERMAL | Status: DC | PRN

## 2023-05-18 MED ORDER — HEPARIN (PORCINE) 25000 UT/250ML-% IV SOLN
2000.0000 [IU]/h | INTRAVENOUS | Status: DC
  Administered 2023-05-18: 1100 [IU]/h via INTRAVENOUS
  Administered 2023-05-20: 1200 [IU]/h via INTRAVENOUS
  Administered 2023-05-21: 1300 [IU]/h via INTRAVENOUS
  Administered 2023-05-22: 1600 [IU]/h via INTRAVENOUS
  Administered 2023-05-22: 1300 [IU]/h via INTRAVENOUS
  Administered 2023-05-23: 1700 [IU]/h via INTRAVENOUS
  Administered 2023-05-24: 1800 [IU]/h via INTRAVENOUS
  Administered 2023-05-25: 1900 [IU]/h via INTRAVENOUS
  Administered 2023-05-25: 1800 [IU]/h via INTRAVENOUS
  Administered 2023-05-26: 2000 [IU]/h via INTRAVENOUS
  Filled 2023-05-18 (×12): qty 250

## 2023-05-18 MED ORDER — VANCOMYCIN HCL IN DEXTROSE 1-5 GM/200ML-% IV SOLN: 1000.0000 mg | INTRAVENOUS | Status: DC

## 2023-05-18 MED ORDER — GADOBUTROL 1 MMOL/ML IV SOLN
6.0000 mL | Freq: Once | INTRAVENOUS | Status: AC | PRN
  Administered 2023-05-18: 6 mL via INTRAVENOUS

## 2023-05-18 MED ORDER — VITAMIN B-12 1000 MCG PO TABS
1000.0000 ug | ORAL_TABLET | Freq: Every day | ORAL | Status: DC
  Administered 2023-05-19 – 2023-06-03 (×17): 1000 ug via ORAL
  Filled 2023-05-18 (×13): qty 1
  Filled 2023-05-18: qty 2
  Filled 2023-05-18: qty 1
  Filled 2023-05-18: qty 2
  Filled 2023-05-18: qty 1

## 2023-05-18 MED ORDER — BISACODYL 5 MG PO TBEC
10.0000 mg | DELAYED_RELEASE_TABLET | Freq: Every day | ORAL | Status: DC | PRN
  Administered 2023-06-02: 10 mg via ORAL
  Filled 2023-05-18: qty 2

## 2023-05-18 MED ORDER — SODIUM CHLORIDE 0.9 % IV SOLN
2.0000 g | Freq: Once | INTRAVENOUS | Status: AC
  Administered 2023-05-18: 2 g via INTRAVENOUS
  Filled 2023-05-18: qty 20

## 2023-05-18 MED ORDER — ROSUVASTATIN CALCIUM 10 MG PO TABS
40.0000 mg | ORAL_TABLET | Freq: Every day | ORAL | Status: DC
  Administered 2023-05-19 – 2023-06-02 (×15): 40 mg via ORAL
  Filled 2023-05-18 (×4): qty 4
  Filled 2023-05-18: qty 2
  Filled 2023-05-18 (×6): qty 4
  Filled 2023-05-18: qty 2
  Filled 2023-05-18 (×5): qty 4

## 2023-05-18 MED ORDER — POLYETHYLENE GLYCOL 3350 17 G PO PACK
17.0000 g | PACK | Freq: Every day | ORAL | Status: DC
  Administered 2023-05-19 – 2023-05-31 (×9): 17 g via ORAL
  Filled 2023-05-18 (×15): qty 1

## 2023-05-18 MED ORDER — SENNOSIDES-DOCUSATE SODIUM 8.6-50 MG PO TABS: 1.0000 | ORAL_TABLET | Freq: Every evening | ORAL | Status: DC | PRN

## 2023-05-18 MED ORDER — HYDROCODONE-ACETAMINOPHEN 5-325 MG PO TABS: 1.0000 | ORAL_TABLET | Freq: Four times a day (QID) | ORAL | Status: AC | PRN

## 2023-05-18 MED ORDER — ASPIRIN 81 MG PO TBEC
81.0000 mg | DELAYED_RELEASE_TABLET | Freq: Every day | ORAL | Status: DC
  Administered 2023-05-18 – 2023-06-03 (×17): 81 mg via ORAL
  Filled 2023-05-18 (×17): qty 1

## 2023-05-18 MED ORDER — ONDANSETRON HCL 4 MG/2ML IJ SOLN
4.0000 mg | Freq: Four times a day (QID) | INTRAMUSCULAR | Status: AC | PRN
  Filled 2023-05-18: qty 2

## 2023-05-18 MED ORDER — HYDROCODONE-ACETAMINOPHEN 7.5-325 MG PO TABS: 1.0000 | ORAL_TABLET | Freq: Four times a day (QID) | ORAL | Status: DC | PRN

## 2023-05-18 MED ORDER — ONDANSETRON HCL 4 MG PO TABS
4.0000 mg | ORAL_TABLET | Freq: Four times a day (QID) | ORAL | Status: AC | PRN
  Filled 2023-05-18: qty 1

## 2023-05-18 MED ORDER — HEPARIN BOLUS VIA INFUSION
4500.0000 [IU] | Freq: Once | INTRAVENOUS | Status: AC
  Administered 2023-05-18: 4500 [IU] via INTRAVENOUS
  Filled 2023-05-18: qty 4500

## 2023-05-18 MED ORDER — MORPHINE SULFATE (PF) 4 MG/ML IV SOLN: 4.0000 mg | INTRAVENOUS | Status: AC | PRN

## 2023-05-18 MED ORDER — INSULIN ASPART 100 UNIT/ML IJ SOLN
0.0000 [IU] | Freq: Three times a day (TID) | INTRAMUSCULAR | Status: DC
  Administered 2023-05-18 – 2023-05-22 (×2): 3 [IU] via SUBCUTANEOUS
  Administered 2023-05-23: 2 [IU] via SUBCUTANEOUS
  Administered 2023-05-23: 3 [IU] via SUBCUTANEOUS
  Administered 2023-05-24 (×2): 2 [IU] via SUBCUTANEOUS
  Administered 2023-05-24: 3 [IU] via SUBCUTANEOUS
  Administered 2023-05-25: 2 [IU] via SUBCUTANEOUS
  Administered 2023-05-25: 3 [IU] via SUBCUTANEOUS
  Administered 2023-05-26 – 2023-05-27 (×4): 2 [IU] via SUBCUTANEOUS
  Administered 2023-05-28 (×3): 3 [IU] via SUBCUTANEOUS
  Administered 2023-05-29 (×2): 2 [IU] via SUBCUTANEOUS
  Administered 2023-05-29: 3 [IU] via SUBCUTANEOUS
  Administered 2023-05-30: 2 [IU] via SUBCUTANEOUS
  Administered 2023-05-30: 5 [IU] via SUBCUTANEOUS
  Administered 2023-05-31 (×2): 2 [IU] via SUBCUTANEOUS
  Administered 2023-06-01: 3 [IU] via SUBCUTANEOUS
  Administered 2023-06-01 – 2023-06-02 (×2): 2 [IU] via SUBCUTANEOUS
  Administered 2023-06-02 – 2023-06-03 (×3): 3 [IU] via SUBCUTANEOUS
  Filled 2023-05-18 (×29): qty 1

## 2023-05-18 MED ORDER — SODIUM CHLORIDE 0.9 % IV SOLN
2.0000 g | Freq: Two times a day (BID) | INTRAVENOUS | Status: DC
  Administered 2023-05-18 – 2023-05-24 (×12): 2 g via INTRAVENOUS
  Filled 2023-05-18 (×12): qty 12.5

## 2023-05-18 MED ORDER — ACETAMINOPHEN 650 MG RE SUPP: 650.0000 mg | Freq: Four times a day (QID) | RECTAL | Status: AC | PRN

## 2023-05-18 MED ORDER — ACETAMINOPHEN 325 MG PO TABS
650.0000 mg | ORAL_TABLET | Freq: Four times a day (QID) | ORAL | Status: AC | PRN
  Administered 2023-05-20 – 2023-05-21 (×2): 650 mg via ORAL
  Filled 2023-05-18 (×2): qty 2

## 2023-05-18 MED ORDER — INSULIN ASPART 100 UNIT/ML IJ SOLN: 0.0000 [IU] | Freq: Every day | INTRAMUSCULAR | Status: DC

## 2023-05-18 MED ORDER — FOLIC ACID 1 MG PO TABS
1.0000 mg | ORAL_TABLET | Freq: Every day | ORAL | Status: DC
  Administered 2023-05-19 – 2023-06-03 (×17): 1 mg via ORAL
  Filled 2023-05-18 (×17): qty 1

## 2023-05-18 NOTE — Consult Note (Signed)
ANTICOAGULATION CONSULT NOTE   Pharmacy Consult for heparin infusion Indication:  Lower extremity ischemia  No Known Allergies  Patient Measurements: Weight: 64.4 kg (142 lb) Heparin Dosing Weight: 64.4 kg  Vital Signs: Temp: 98.3 F (36.8 C) (06/12 1153) Temp Source: Oral (06/12 1153) BP: 105/69 (06/12 1140) Pulse Rate: 78 (06/12 1140)  Labs: Recent Labs    05/18/23 1148 05/18/23 1521 05/18/23 2200  HGB 10.6*  --   --   HCT 33.5*  --   --   PLT 274  --   --   APTT  --  36  --   LABPROT  --  14.3  --   INR  --  1.1  --   HEPARINUNFRC  --   --  0.30  CREATININE 1.24  --   --      Estimated Creatinine Clearance: 50.5 mL/min (by C-G formula based on SCr of 1.24 mg/dL).   Medical History: Past Medical History:  Diagnosis Date   Basal cell carcinoma 08/11/2022   R forearm - ED&C   Depression    Foot ulcer (HCC) 12/12/2016   Hyperlipidemia    Hypertension    PAD (peripheral artery disease) (HCC) ~2007   s/p R BKA for non-healing wound   Stroke (HCC) 03/2014   MRI: Acute nonhemorrhagic left paracentral pontine infarct. 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   TIA (transient ischemic attack) 01/2014    Medications:  No prior anticoagulation noted   Assessment: 72 year old male with history of hypertension, hyperlipidemia, depression, TIA, who presents to the emergency department for chief concerns of left foot pain and redness with swelling. Pt with history of prior lower extremity stents placed 02/2023. Pharmacy has been consulted to initiate and manage IV heparin therapy for lower extremity aterial occlusion   Goal of Therapy:  Heparin level 0.3-0.7 units/ml Monitor platelets by anticoagulation protocol: Yes   06/12 2200 HL 0.30, therapeutic x 1  Plan:  Continue heparin infusion at 1100 units/hr Recheck HL w/ AM labs to confirm CBC daily while on heparin  Otelia Sergeant,  PharmD, [facility] 05/18/2023 11:01 PM

## 2023-05-18 NOTE — H&P (View-Only) (Signed)
Hospital Consult    Reason for Consult:  Left Lower Extremity Foot Pain  Requesting Physician:  Dr Amy Cox MD  MRN #:  [id number]  History of Present Illness: This is a 72 y.o. male Jaydyn Makepeace is a 72 y.o. male with a past medical history of hypertension, hyperlipidemia, depression, TIA, presents to the emergency department for left foot redness and swelling with pain.  According to the patient for the past 5 days or so he has noticed increased redness and swelling to the left foot now with an ulceration to the great toe.  Patient states a history of prior lower extremity stents placed by Dr. Schnier in March.  Patient denies any fever.  States neuropathy denies any pain.   On exam this afternoon the patient rests comfortably in a stretcher in the emergency department.  Left lower extremity is warm to touch and reddened.  She may have cellulitis of the foot which extends to the distal knee.  Patient endorses pain at rest as well as exercise.  Patient's sister who is at the bedside states that the patient started smoking a week ago and that is when she noticed his foot swelling and becoming red.  Vascular surgery was consulted to evaluate patient's prior history of peripheral artery disease current symptoms.  Past Medical History:  Diagnosis Date   Basal cell carcinoma 08/11/2022   R forearm - ED&C   Depression    Foot ulcer (HCC) 12/12/2016   Hyperlipidemia    Hypertension    PAD (peripheral artery disease) (HCC) ~2007   s/p R BKA for non-healing wound   Stroke (HCC) 03/2014   MRI: Acute nonhemorrhagic left paracentral pontine infarct. 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   TIA (transient ischemic attack) 01/2014    Past Surgical History:  Procedure Laterality Date   BELOW KNEE LEG AMPUTATION Right    FEMORAL-POPLITEAL BYPASS GRAFT Left 12/17/2016   Procedure: BYPASS LEFT FEMORAL TO BELOW POPLITEAL ARTERY  USING PROPATEN GORE GRAFT;  Surgeon: Todd F Early, MD;  Location: [facility] OR;  Service: Vascular;  Laterality: Left;   FEMORAL-POPLITEAL BYPASS GRAFT Left 09/30/2017   Procedure: LEFT LEG ANGIOGRAM,  THROMBECTOMY, FEM-POPLITEAL BYPASS GRAFT, tHROMBECTOMY PERONEAL ARTERY AND POSTERIOR TIBIAL , ENDARTERECTOMY TIBIAL/PERONEAL TRUNK WITH BOVINE PATCH ANGIOPLASTY.;  Surgeon: Chen, Brian L, MD;  Location: [facility] OR;  Service: Vascular;  Laterality: Left;   INTRAMEDULLARY (IM) NAIL INTERTROCHANTERIC Right 03/21/2023   Procedure: INTRAMEDULLARY (IM) NAIL INTERTROCHANTERIC;  Surgeon: Miller, Howard, MD;  Location: [facility] ORS;  Service: Orthopedics;  Laterality: Right;   LOWER EXTREMITY ANGIOGRAPHY Left 12/14/2022   Procedure: Lower Extremity Angiography;  Surgeon: Schnier, Gregory G, MD;  Location: [facility] INVASIVE CV LAB;  Service: Cardiovascular;  Laterality: Left;   PERIPHERAL VASCULAR CATHETERIZATION Left 12/14/2016   Procedure: Abdominal Aortogram w/Lower Extremity;  Surgeon: Christopher S Dickson, MD;  Location: [facility] INVAS[facility] CV [facility];  Service: Cardiovascular;  Laterality: Left;   right cataract extraction     TRANSTHORACIC ECHOCARDIOGRAM  01/2014   To evaluate possible CVA: EF 55-60%. GR 1 DD. No significant valvular lesions    No Known Allergies  Prior to Admission medications   Medication Sig Start Date End Date Taking? Authorizing Provider  acetaminophen (TYLENOL) 500 MG tablet Take 500 mg by mouth every 6 (six) hours as needed.    [provider]  ascorbic acid (VITAMIN C) 500 MG tablet Take 1 tablet (500 mg total) by mouth daily.   03/26/23 06/24/23  Kumar, Dileep, MD  aspirin 81 MG tablet Take 81 mg by mouth daily.    [provider]  bisacodyl 5 MG EC tablet Take 2 tablets (10 mg total) by mouth daily as needed for moderate constipation. 03/25/23   Kumar, Dileep, MD  blood glucose meter kit and supplies KIT Dispense based on patient and insurance preference. Use two times daily as directed  (before breakfast and at bedtime. (FOR ICD-10: E11.51) 02/11/22   Nche, Charlotte Lum, NP  clopidogrel (PLAVIX) 75 MG tablet Take 1 tablet (75 mg total) by mouth daily. 12/14/22   Schnier, Gregory G, MD  cyanocobalamin 1000 MCG tablet Take 1 tablet (1,000 mcg total) by mouth daily. 03/31/23 06/29/23  Kumar, Dileep, MD  ferrous sulfate 325 (65 FE) MG tablet Take 1 tablet (325 mg total) by mouth daily with breakfast. 03/26/23 06/24/23  Kumar, Dileep, MD  folic acid (FOLVITE) 1 MG tablet Take 1 tablet (1 mg total) by mouth daily. 03/26/23 06/24/23  Kumar, Dileep, MD  HYDROcodone-acetaminophen (NORCO/VICODIN) 5-325 MG tablet Take 1 tablet by mouth every 8 (eight) hours as needed for moderate pain. 03/31/23   Ayiku, Bernard, MD  icosapent Ethyl (VASCEPA) 1 g capsule Take 2 capsules (2 g total) by mouth 2 (two) times daily. 01/27/23 07/26/23  Dugal, Tabitha, FNP  latanoprost (XALATAN) 0.005 % ophthalmic solution Place 1 drop into both eyes at bedtime. 11/12/22   [provider]  metFORMIN (GLUCOPHAGE) 850 MG tablet Take 1 tablet (850 mg total) by mouth 2 (two) times daily with a meal. 05/09/23 05/03/24  Dugal, Tabitha, FNP  olmesartan (BENICAR) 40 MG tablet Take 1 tablet (40 mg total) by mouth daily. 05/09/23   Dugal, Tabitha, FNP  polyethylene glycol (MIRALAX) 17 g packet Take 17 g by mouth daily. 03/25/23   Kumar, Dileep, MD  rosuvastatin (CRESTOR) 40 MG tablet Take 1 tablet (40 mg total) by mouth daily. 01/31/23   Dugal, Tabitha, FNP  sertraline (ZOLOFT) 100 MG tablet Take 1 tablet (100 mg total) by mouth daily. 05/09/23   Dugal, Tabitha, FNP  spironolactone (ALDACTONE) 25 MG tablet Take 1 tablet (25 mg total) by mouth daily. 05/09/23   Dugal, Tabitha, FNP    Social History   Socioeconomic History   Marital status: Widowed    Spouse name: Not on file   Number of children: 2   Years of education: Not on file   Highest education level: Not on file  Occupational History    Comment: Disabled  Tobacco Use    Smoking status: Some Days    Packs/day: 15.00    Years: 60.00    Additional pack years: 0.00    Total pack years: 900.00    Types: Cigarettes   Smokeless tobacco: Never  Vaping Use   Vaping Use: Never used  Substance and Sexual Activity   Alcohol use: No   Drug use: No   Sexual activity: Not Currently    Birth control/protection: Abstinence  Other Topics Concern   Not on file  Social History Narrative   One boy youngest, killed in MVA    Social Determinants of Health   Financial Resource Strain: Low Risk  (10/08/2021)   Overall Financial Resource Strain (CARDIA)    Difficulty of Paying Living Expenses: Not hard at all  Food Insecurity: No Food Insecurity (03/28/2023)   Hunger Vital Sign    Worried About Running Out of Food in the Last Year: Never true    Ran Out of Food in   the Last Year: Never true  Transportation Needs: No Transportation Needs (03/28/2023)   PRAPARE - Transportation    Lack of Transportation (Medical): No    Lack of Transportation (Non-Medical): No  Physical Activity: Insufficiently Active (04/07/2022)   Exercise Vital Sign    Days of Exercise per Week: 2 days    Minutes of Exercise per Session: 30 min  Stress: No Stress Concern Present (04/07/2022)   [employer] - Occupational Stress Questionnaire    Feeling of Stress : Not at all  Social Connections: Socially Isolated (04/07/2022)   Social Connection and Isolation Panel [NHANES]    Frequency of Communication with Friends and Family: Three times a week    Frequency of Social Gatherings with Friends and Family: Three times a week    Attends Religious Services: Never    Active Member of Clubs or Organizations: No    Attends Club or Organization Meetings: Never    Marital Status: Widowed  Intimate Partner Violence: Not At Risk (03/28/2023)   Humiliation, Afraid, Rape, and Kick questionnaire    Fear of Current or Ex-Partner: No    Emotionally Abused: No    Physically Abused: No     Sexually Abused: No     Family History  Problem Relation Age of Onset   Hypertension Mother        Does not know history   Heart disease Mother    Stroke Mother    Diabetes Mother    Hypertension Father    Heart disease Father    Stroke Father    Diabetes Sister    Hypertension Sister    Heart disease Brother    Hypertension Brother     ROS: Otherwise negative unless mentioned in HPI  Physical Examination  Vitals:   05/18/23 1140 05/18/23 1153  BP: 105/69   Pulse: 78   Resp: 18   Temp:  98.3 F (36.8 C)  SpO2: 97%    Body mass index is 18.73 kg/m.  General:  WDWN in NAD Gait: Not observed HENT: WNL, normocephalic Pulmonary: normal non-labored breathing, without Rales, rhonchi,  wheezing Cardiac: regular, without  Murmurs, rubs or gallops; without carotid bruits Abdomen: Bowel sounds throughout, soft, NT/ND, no masses Skin: without rashes Vascular Exam/Pulses: Lower extremity BKA, left lower extremity unable to palpate PT/DP pulses due to +2 edema. Extremities: with ischemic changes, without Gangrene , with cellulitis; without open wounds;  Musculoskeletal: no muscle wasting or atrophy  Neurologic: A&O X 3;  No focal weakness or paresthesias are detected; speech is fluent/normal Psychiatric:  The pt has Normal affect. Lymph:  Unremarkable  CBC    Component Value Date/Time   WBC 11.4 (H) 05/18/2023 1148   RBC 3.57 (L) 05/18/2023 1148   HGB 10.6 (L) 05/18/2023 1148   HCT 33.5 (L) 05/18/2023 1148   PLT 274 05/18/2023 1148   MCV 93.8 05/18/2023 1148   MCH 29.7 05/18/2023 1148   MCHC 31.6 05/18/2023 1148   RDW 16.4 (H) 05/18/2023 1148   LYMPHSABS 3.0 05/18/2023 1148   MONOABS 0.6 05/18/2023 1148   EOSABS 0.3 05/18/2023 1148   BASOSABS 0.1 05/18/2023 1148    BMET    Component Value Date/Time   NA 141 05/18/2023 1148   NA 139 02/03/2016 0000   K 4.8 05/18/2023 1148   CL 112 (H) 05/18/2023 1148   CO2 19 (L) 05/18/2023 1148   GLUCOSE 122 (H)  05/18/2023 1148   BUN 27 (H) 05/18/2023 1148   BUN 28 (  A) 02/03/2016 0000   CREATININE 1.24 05/18/2023 1148   CALCIUM 8.0 (L) 05/18/2023 1148   GFRNONAA >60 05/18/2023 1148   GFRAA >60 10/02/2017 0943    COAGS: Lab Results  Component Value Date   INR 1.1 03/21/2023   INR 1.00 09/30/2017   INR 1.08 12/14/2016     Non-Invasive Vascular Imaging:   MRI Left Foot = No results at this time  EXAM:05/18/2023 Left LOWER EXTREMITY VENOUS DOPPLER ULTRASOUND   TECHNIQUE: Gray-scale sonography with compression, as well as color and duplex ultrasound, were performed to evaluate the deep venous system(s) from the level of the common femoral vein through the popliteal and proximal calf veins.   COMPARISON:  12/14/2022   FINDINGS: VENOUS   Normal compressibility of the common femoral, superficial femoral, and popliteal veins, as well as the visualized calf veins. Visualized portions of profunda femoral vein and great saphenous vein unremarkable. No filling defects to suggest DVT on grayscale or color Doppler imaging. Doppler waveforms show normal direction of venous flow, normal respiratory plasticity and response to augmentation.   Limited views of the contralateral common femoral vein are unremarkable.  EXAM:05/18/2023 LEFT FOOT - COMPLETE 3+ VIEW   COMPARISON:  12/12/2016   FINDINGS: No recent displaced fracture or dislocation is seen. Erosive changes are noted in the tip of distal phalanx of big toe. Distal portion of proximal phalanx of fourth toe and proximal portion of middle phalanx of the left fourth toe are missing. This may be residual from previous intervention.   IMPRESSION: No recent fracture or dislocation is seen. Erosions are seen in the tip of distal phalanx of left big toe. If there is clinical suspicion for osteomyelitis, follow-up MRI may be considered.    Statin:  Yes.   Beta Blocker:  No. Aspirin:  Yes.   ACEI:  No. ARB:  Yes.   CCB use:   No Other antiplatelets/anticoagulants:  Yes.   Plavix 75 mg Daily   ASSESSMENT/PLAN: This is a 72 y.o. male is to [facility]'s emergency department today for left foot pain with swelling.  It appears the patient may have some cellulitis associated with +2 edema to his left foot.  To the patient's sister the patient started smoking a week ago and she correlates this to when his symptoms started.  PLAN: Vascular surgery plans on taking the patient to vascular lab either later this week or early next week for a left lower extremity angiogram with possible intervention.  I discussed in detail with the patient and his sister at the bedside the procedure, benefits, risks, and complications.  Both verbalized their understanding.  I answered all their questions this afternoon.  Both would like to proceed as soon as possible.  Patient will be made n.p.o. at midnight before the day of procedure.   -Discussed the plan in detail with Dr. Jason Dew MD and Dr. Greg Schnier MD who both agree with the plan.   Mykah Bellomo R [facility] 05/18/2023 2:44 PM  

## 2023-05-18 NOTE — Assessment & Plan Note (Signed)
>>  ASSESSMENT AND PLAN FOR HYPERTRIGLYCERIDEMIA WRITTEN ON 05/18/2023  6:07 PM BY COX, AMY N, DO  Home rosuvastatin  40 mg nightly resumed

## 2023-05-18 NOTE — Consult Note (Signed)
ANTICOAGULATION CONSULT NOTE - Initial Consult  Pharmacy Consult for heparin infusion Indication:  Lower extremity ischemia  No Known Allergies  Patient Measurements: Weight: 64.4 kg (142 lb) Heparin Dosing Weight: 64.4 kg  Vital Signs: Temp: 98.3 F (36.8 C) (06/12 1153) Temp Source: Oral (06/12 1153) BP: 105/69 (06/12 1140) Pulse Rate: 78 (06/12 1140)  Labs: Recent Labs    05/18/23 1148  HGB 10.6*  HCT 33.5*  PLT 274  CREATININE 1.24    Estimated Creatinine Clearance: 50.5 mL/min (by C-G formula based on SCr of 1.24 mg/dL).   Medical History: Past Medical History:  Diagnosis Date   Basal cell carcinoma 08/11/2022   R forearm - ED&C   Depression    Foot ulcer (HCC) 12/12/2016   Hyperlipidemia    Hypertension    PAD (peripheral artery disease) (HCC) ~2007   s/p R BKA for non-healing wound   Stroke (HCC) 03/2014   MRI: Acute nonhemorrhagic left paracentral pontine infarct. 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   TIA (transient ischemic attack) 01/2014    Medications:  No prior anticoagulation noted   Assessment: 72 year old male with history of hypertension, hyperlipidemia, depression, TIA, who presents to the emergency department for chief concerns of left foot pain and redness with swelling. Pt with history of prior lower extremity stents placed 02/2023. Pharmacy has been consulted to initiate and manage IV heparin therapy for lower extremity aterial occlusion   Goal of Therapy:  Heparin level 0.3-0.7 units/ml Monitor platelets by anticoagulation protocol: Yes   Plan:  Give 4500 units bolus x 1 Start heparin infusion at 1100 units/hr Check anti-Xa level in 6 hours and daily while on heparin Continue to monitor H&H and platelets  Sharen Hones, PharmD, BCPS Clinical Pharmacist   05/18/2023,2:20 PM

## 2023-05-18 NOTE — ED Notes (Signed)
Patient returned from MRI at this time.  

## 2023-05-18 NOTE — Hospital Course (Addendum)
Jordan Arellano is a 72 year old male with history of hypertension, hyperlipidemia, depression, TIA, who presents to the emergency department for chief concerns of left foot pain and redness with swelling. Patient had a significant ischemia, seen by vascular surgery, scheduled for angiogram 6/14.  Patient is also placed on broad-spectrum antibiotics for osteomyelitis.  MRI of the foot showed osteomyelitis of the first and third toes.  Podiatry is scheduled for surgery on Saturday

## 2023-05-18 NOTE — ED Notes (Signed)
The pt gave verbal consent to be transferred to [facility] for care.

## 2023-05-18 NOTE — ED Provider Notes (Signed)
[facility] Provider Note    Event Date/Time   First MD Initiated Contact with Patient 05/18/23 1320     (approximate)  History   Chief Complaint: Foot Pain  HPI  Jordan Arellano is a 72 y.o. male with a past medical history of hypertension, hyperlipidemia, depression, TIA, presents to the emergency department for left foot redness and swelling.  According to the patient for the past 5 days or so he has noticed increased redness and swelling to the left foot now with an ulceration to the great toe.  Patient states a history of prior lower extremity stents placed by Dr. Gilda Crease in March.  Patient denies any fever.  States neuropathy denies any pain.  Physical Exam   Triage Vital Signs: ED Triage Vitals  Enc Vitals Group     BP 05/18/23 1140 105/69     Pulse Rate 05/18/23 1140 78     Resp 05/18/23 1140 18     Temp 05/18/23 1153 98.3 F (36.8 C)     Temp Source 05/18/23 1153 Oral     SpO2 05/18/23 1140 97 %     Weight 05/18/23 1138 142 lb (64.4 kg)     Height --      Head Circumference --      Peak Flow --      Pain Score 05/18/23 1138 0     Pain Loc --      Pain Edu? --      Excl. in GC? --     Most recent vital signs: Vitals:   05/18/23 1140 05/18/23 1153  BP: 105/69   Pulse: 78   Resp: 18   Temp:  98.3 F (36.8 C)  SpO2: 97%     General: Awake, no distress.  CV:  Good peripheral perfusion.  Regular rate and rhythm  Resp:  Normal effort.  Equal breath sounds bilaterally.  Abd:  No distention.  Soft, nontender.  No rebound or guarding. Other:  Prior right lower extremity amputation.  Left lower extremity has a erythematous and swollen distal foot especially over the first and second toes.  Unable to palpate DP pulse, unable to Doppler DP pulse however warm to the touch.   ED Results / Procedures / Treatments   RADIOLOGY  I have reviewed and interpreted the x-ray images.  Possible mild osteomyelitis of the distal phalanx. Radiology  confirms possible osteomyelitis of the distal phalanx.   MEDICATIONS ORDERED IN ED: Medications  cefTRIAXone (ROCEPHIN) 2 g in sodium chloride 0.9 % 100 mL IVPB (has no administration in time range)     IMPRESSION / MDM / ASSESSMENT AND PLAN / ED COURSE  I reviewed the triage vital signs and the nursing notes.  Patient's presentation is most consistent with acute presentation with potential threat to life or bodily function.  Patient presents emergency department for left foot redness and swelling.  Unable to do DP pulse in the foot.  Patient has a known history of peripheral artery disease has had prior stents by vascular surgery.  Patient's workup in the emergency department shows a mild leukocytosis with a white blood cell count of 11.4 otherwise reassuring CBC with chronic anemia, chemistry shows no significant findings reassuring renal function.  Patient did have a D-dimer performed in triage however denies any chest pain or shortness of breath highly suspect more arterial occlusion than DVT.  I spoke with Dr. Wyn Quaker of vascular surgery who recommend starting the patient on heparin.  Will also cover  with IV Rocephin and obtain x-ray imaging of the foot.  We will admit to the hospitalist for continued IV antibiotics and vascular surgery consultation.  X-ray shows possible osteomyelitis.  Discussed with vascular surgery we will place on heparin IV antibiotics and admit to the hospital service for further workup and treatment.  Patient agreeable to plan of care.  CRITICAL CARE Performed by: Minna Antis   Total critical care time: 30 minutes  Critical care time was exclusive of separately billable procedures and treating other patients.  Critical care was necessary to treat or prevent imminent or life-threatening deterioration.  Critical care was time spent personally by me on the following activities: development of treatment plan with patient and/or surrogate as well as nursing,  discussions with consultants, evaluation of patient's response to treatment, examination of patient, obtaining history from patient or surrogate, ordering and performing treatments and interventions, ordering and review of laboratory studies, ordering and review of radiographic studies, pulse oximetry and re-evaluation of patient's condition.   FINAL CLINICAL IMPRESSION(S) / ED DIAGNOSES   Arterial occlusion Cellulitis   Note:  This document was prepared using Dragon voice recognition software and may include unintentional dictation errors.   Minna Antis, MD 05/19/23 2320

## 2023-05-18 NOTE — H&P (Addendum)
History and Physical   Jordan Arellano WUJ:[id number] DOB: February 02, 1951 DOA: 05/18/2023  PCP: Mort Sawyers, FNP Patient coming from: Home  I have personally briefly reviewed patient's old medical records in [facility] EMR.  Chief Concern: Left foot infection  HPI: [name] is a 72 year old male with history of hypertension, hyperlipidemia, depression, TIA, who presents to the emergency department for chief concerns of left foot pain and redness with swelling.  Vitals in the ED showed temperature of 98.3, respiration rate of 18, heart rate is 78, blood pressure 105/69, SpO2 97% on room air.  Serum sodium is 141, potassium 4.8, chloride 112, bicarb 19, BUN of 27, serum creatinine of 1.24, EGFR greater than 60, nonfasting blood glucose 122, WBC 11.4, hemoglobin 10.6, platelets of 274.  D-dimer was elevated at 1.53.  ED treatment: Heparin bolus and GGT, ceftriaxone 2 g IV one-time dose.  EDP discussed with vascular who recommends hospital admission for vascular intervention. ----------------------- At bedside, he is able to tell me his name, age, current location, current calendar year.  Patient reports that he noted his right foot had a wound over the last 3 to 4 days.  He does not know how the wound started. He denies known trauma. He denies any fever, chills, dysuria, hematuria, diarrhea, chest pain, shortness of breath.  Social history: He lives at home on his own. He smokes 3 cigarettes per day. He denies etoh use. He denies recreational drug use. He is retired and formerly was a [employer] architect.  ROS: Constitutional: no weight change, no fever ENT/Mouth: no sore throat, no rhinorrhea Eyes: no eye pain, no vision changes Cardiovascular: no chest pain, no dyspnea,  no edema, no palpitations Respiratory: no cough, no sputum, no wheezing Gastrointestinal: no nausea, no vomiting, no diarrhea, no constipation Genitourinary: no urinary incontinence, no dysuria, no  hematuria Musculoskeletal: no arthralgias, no myalgias Skin: + skin lesions, no pruritus, Neuro: no weakness, no loss of consciousness, no syncope Psych: no anxiety, no depression, no decrease appetite Heme/Lymph: no bruising, no bleeding  ED Course: Discussed with emergency medicine provider, patient requiring hospitalization for chief concerns of left limb ischemia.  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) Active Problems:   Essential hypertension   Dyslipidemia   Depression   History of CVA (cerebrovascular accident) without residual deficits   Tobacco abuse   PAD s/p right BKA, history revascularization left leg   Hypertriglyceridemia   Status post femoral-popliteal bypass surgery   Left renal artery stenosis (HCC)   Recurrent major depressive disorder, in full remission (HCC)   Heart failure with preserved ejection fraction (HCC)   Encounter for tobacco use cessation counseling   At risk for constipation   Assessment and Plan:  * 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 team may consider podiatry consultation for I&D as needed Vancomycin and cefepime per pharmacy  ordered MRI of the left foot with and without contrast ordered to assess for osteomyelitis N.p.o. after midnight Admit to telemetry cardiac, inpatient  Essential  hypertension Hydralazine 5 mg IV every 6 hours as needed for SBP greater 175, 4 days ordered  At risk for constipation Resumed home daily GlycoLax Senna docusate nightly as needed for mild constipation, Dulcolax daily as needed for moderate constipation  Heart failure with preserved ejection fraction (HCC) Does not appear to be in acute exacerbation at this time  Hypertriglyceridemia Home rosuvastatin 40 mg nightly resumed  Tobacco abuse [place] As needed nicotine patch ordered Patient endorses readiness to stop tobacco use. Greater than 3 minutes spent on tobacco cessation counseling Tobacco cessation counseling:  Extensive discussion at bedside regarding educating patient that continued tobacco use increases risk of limb amputation due to worsening of peripheral artery disease.  Patient endorses understanding and compliance. If missing the feeling of holding a cigarette, cut a sipping straw to the length of a cigarette and hold it between your fingers Call 1800 QUIT NOW if in need of nicotine patches to help with cessation.  I had patient repeat back to me the number to call for free nicotine patches.  History of CVA (cerebrovascular accident) without residual deficits Resumed home aspirin 81 mg daily, rosuvastatin 40 mg nightly  Chart reviewed.   Pending med reconciliation  DVT prophylaxis: heparin per pharmacy Code Status: full code Diet: heart healthy; npo after midnight Family Communication: Updated with sister, HPOA, Dewayne Hatch [name]) Disposition Plan: pending vascular service Consults called: Vascular service per EDP Admission status: Telemetry cardiac, inpatient  Past Medical History:  Diagnosis Date   Basal cell carcinoma 08/11/2022   R forearm - ED&C   Depression    Foot ulcer (HCC) 12/12/2016   Hyperlipidemia    Hypertension    PAD (peripheral artery disease) (HCC) ~2007   s/p R BKA for non-healing wound   Stroke (HCC) 03/2014   MRI: Acute nonhemorrhagic  left paracentral pontine infarct. 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   TIA (transient ischemic attack) 01/2014   Past Surgical History:  Procedure Laterality Date   BELOW KNEE LEG AMPUTATION Right    FEMORAL-POPLITEAL BYPASS GRAFT Left 12/17/2016   Procedure: BYPASS LEFT FEMORAL TO BELOW POPLITEAL ARTERY USING PROPATEN GORE GRAFT;  Surgeon: Larina Earthly, MD;  Location: [facility] OR;  Service: Vascular;  Laterality: Left;   FEMORAL-POPLITEAL BYPASS GRAFT Left 09/30/2017   Procedure: LEFT LEG ANGIOGRAM,  THROMBECTOMY, FEM-POPLITEAL BYPASS GRAFT, tHROMBECTOMY PERONEAL ARTERY AND POSTERIOR TIBIAL , ENDARTERECTOMY TIBIAL/PERONEAL TRUNK WITH BOVINE PATCH ANGIOPLASTY.;  Surgeon: Fransisco Hertz, MD;  Location: [facility] OR;  Service: Vascular;  Laterality: Left;   INTRAMEDULLARY (IM) NAIL INTERTROCHANTERIC Right 03/21/2023   Procedure: INTRAMEDULLARY (IM) NAIL INTERTROCHANTERIC;  Surgeon: Deeann Saint, MD;  Location: [facility] ORS;  Service: Orthopedics;  Laterality: Right;   LOWER EXTREMITY ANGIOGRAPHY Left 12/14/2022   Procedure: Lower Extremity Angiography;  Surgeon: Renford Dills, MD;  Location: [facility] INVASIVE CV LAB;  Service: Cardiovascular;  Laterality: Left;   PERIPHERAL VASCULAR CATHETERIZATION Left 12/14/2016   Procedure: Abdominal Aortogram w/Lower Extremity;  Surgeon: Chuck Hint, MD;  Location: [facility] INVASIVE CV LAB;  Service: Cardiovascular;  Laterality: Left;   right cataract extraction     TRANSTHORACIC ECHOCARDIOGRAM  01/2014   To evaluate possible CVA: EF 55-60%. GR 1 DD. No significant valvular lesions   Social History:  reports that he has been smoking cigarettes.  He has a 900.00 pack-year smoking history. He has never used smokeless tobacco. He reports that he does not drink alcohol and does not use drugs.  No Known Allergies Family History  Problem Relation Age of Onset   Hypertension Mother        Does not  know history   Heart disease Mother    Stroke Mother    Diabetes Mother    Hypertension Father    Heart disease Father    Stroke Father    Diabetes Sister    Hypertension Sister    Heart disease Brother    Hypertension Brother    Family history: Family history reviewed and not pertinent.  Prior to Admission medications   Medication Sig Start Date End Date Taking? Authorizing Provider  acetaminophen (TYLENOL) 500 MG tablet Take 500 mg by mouth every 6 (six) hours as needed.    [provider]  ascorbic acid (VITAMIN C) 500 MG tablet Take 1 tablet (500 mg total) by mouth daily. 03/26/23 06/24/23  Gillis Santa, MD  aspirin 81 MG tablet Take 81 mg by mouth daily.    [provider]  bisacodyl 5 MG EC tablet Take 2 tablets (10 mg total) by mouth daily as needed for moderate constipation. 03/25/23   Gillis Santa, MD  blood glucose meter kit and supplies KIT Dispense based on patient and insurance preference. Use two times daily as directed (before breakfast and at bedtime. (FOR ICD-10: E11.51) 02/11/22   Nche, Bonna Gains, NP  clopidogrel (PLAVIX) 75 MG tablet Take 1 tablet (75 mg total) by mouth daily. 12/14/22   Schnier, Latina Craver, MD  cyanocobalamin 1000 MCG tablet Take 1 tablet (1,000 mcg total) by mouth daily. 03/31/23 06/29/23  Gillis Santa, MD  ferrous sulfate 325 (65 FE) MG tablet Take 1 tablet (325 mg total) by mouth daily with breakfast. 03/26/23 06/24/23  Gillis Santa, MD  folic acid (FOLVITE) 1 MG tablet Take 1 tablet (1 mg total) by mouth daily. 03/26/23 06/24/23  Gillis Santa, MD  HYDROcodone-acetaminophen (NORCO/VICODIN) 5-325 MG tablet Take 1 tablet by mouth every 8 (eight) hours as needed for moderate pain. 03/31/23   Lurene Shadow, MD  icosapent Ethyl (VASCEPA) 1 g capsule Take 2 capsules (2 g total) by mouth 2 (two) times daily. 01/27/23 07/26/23  Mort Sawyers, FNP  latanoprost (XALATAN) 0.005 % ophthalmic solution Place 1 drop into both eyes at bedtime. 11/12/22    [provider]  metFORMIN (GLUCOPHAGE) 850 MG tablet Take 1 tablet (850 mg total) by mouth 2 (two) times daily with a meal. 05/09/23 05/03/24  Mort Sawyers, FNP  olmesartan (BENICAR) 40 MG tablet Take 1 tablet (40 mg total) by mouth daily. 05/09/23   Mort Sawyers, FNP  polyethylene glycol (MIRALAX) 17 g packet Take 17 g by mouth daily. 03/25/23   Gillis Santa, MD  rosuvastatin (CRESTOR) 40 MG tablet Take 1 tablet (40 mg total) by mouth daily. 01/31/23   Mort Sawyers, FNP  sertraline (ZOLOFT) 100 MG tablet Take 1 tablet (100 mg total) by mouth daily. 05/09/23   Mort Sawyers, FNP  spironolactone (ALDACTONE) 25 MG tablet Take 1 tablet (25 mg total) by mouth daily. 05/09/23   Mort Sawyers, FNP   Physical Exam: Vitals:   05/18/23 1138 05/18/23 1140 05/18/23 1153  BP:  105/69   Pulse:  78   Resp:  18   Temp:   98.3 F (36.8 C)  TempSrc:   Oral  SpO2:  97%   Weight: 64.4 kg  Constitutional: appears age appropriate, NAD, calm Eyes: PERRL, lids and conjunctivae normal ENMT: Mucous membranes are moist. Posterior pharynx clear of any exudate or lesions. Age-appropriate dentition. Hearing appropriate Neck: normal, supple, no masses, no thyromegaly Respiratory: clear to auscultation bilaterally, no wheezing, no crackles. Normal respiratory effort. No accessory muscle use.  Cardiovascular: Regular rate and rhythm, no murmurs / rubs / gallops. No extremity edema. 2+ pedal pulses. No carotid bruits.  Abdomen: no tenderness, no masses palpated, no hepatosplenomegaly. Bowel sounds positive.  Musculoskeletal: no clubbing / cyanosis. No joint deformity upper and lower extremities. Good ROM, no contractures, no atrophy. Normal muscle tone.  Skin: left toes infection, with foot involvement    With redness and increased warmth Neurologic: Sensation intact. Strength 5/5 in all 4.  Psychiatric: Normal judgment and insight. Alert and oriented x 3. Normal mood.   EKG: not indicated at this  time  Chest x-ray on Admission: I personally reviewed and I agree with radiologist reading as below.  US Venous Img Lower Unilateral Left (DVT)  Result Date: 05/18/2023 CLINICAL DATA:  Left leg pain and swelling EXAM: Left LOWER EXTREMITY VENOUS DOPPLER ULTRASOUND TECHNIQUE: Gray-scale sonography with compression, as well as color and duplex ultrasound, were performed to evaluate the deep venous system(s) from the level of the common femoral vein through the popliteal and proximal calf veins. COMPARISON:  12/14/2022 FINDINGS: VENOUS Normal compressibility of the common femoral, superficial femoral, and popliteal veins, as well as the visualized calf veins. Visualized portions of profunda femoral vein and great saphenous vein unremarkable. No filling defects to suggest DVT on grayscale or color Doppler imaging. Doppler waveforms show normal direction of venous flow, normal respiratory plasticity and response to augmentation. Limited views of the contralateral common femoral vein are unremarkable. OTHER None. Limitations: none IMPRESSION: Negative. Electronically Signed   By: Paulina Fusi M.D.   On: 05/18/2023 15:18   DG Foot Complete Left  Result Date: 05/18/2023 CLINICAL DATA:  Pain and swelling EXAM: LEFT FOOT - COMPLETE 3+ VIEW COMPARISON:  12/12/2016 FINDINGS: No recent displaced fracture or dislocation is seen. Erosive changes are noted in the tip of distal phalanx of big toe. Distal portion of proximal phalanx of fourth toe and proximal portion of middle phalanx of the left fourth toe are missing. This may be residual from previous intervention. IMPRESSION: No recent fracture or dislocation is seen. Erosions are seen in the tip of distal phalanx of left big toe. If there is clinical suspicion for osteomyelitis, follow-up MRI may be considered. Electronically Signed   By: Ernie Avena M.D.   On: 05/18/2023 14:40    Labs on Admission: I have personally reviewed following labs  CBC: Recent  Labs  Lab 05/18/23 1148  WBC 11.4*  NEUTROABS 7.4  HGB 10.6*  HCT 33.5*  MCV 93.8  PLT 274   Basic Metabolic Panel: Recent Labs  Lab 05/18/23 1148  NA 141  K 4.8  CL 112*  CO2 19*  GLUCOSE 122*  BUN 27*  CREATININE 1.24  CALCIUM 8.0*   GFR: Estimated Creatinine Clearance: 50.5 mL/min (by C-G formula based on SCr of 1.24 mg/dL).  Liver Function Tests: Recent Labs  Lab 05/18/23 1148  AST 19  ALT 11  ALKPHOS 68  BILITOT 0.1*  PROT 7.6  ALBUMIN 3.8   Urine analysis:    Component Value Date/Time   COLORURINE YELLOW (A) 03/27/2023 1811   APPEARANCEUR CLOUDY (A) 03/27/2023 1811   LABSPEC 1.016 03/27/2023 1811   PHURINE 5.0 03/27/2023 1811  GLUCOSEU NEGATIVE 03/27/2023 1811   HGBUR NEGATIVE 03/27/2023 1811   BILIRUBINUR neg 04/18/2023 1411   KETONESUR NEGATIVE 03/27/2023 1811   PROTEINUR Positive (A) 04/18/2023 1411   PROTEINUR 30 (A) 03/27/2023 1811   UROBILINOGEN 0.2 04/18/2023 1411   UROBILINOGEN 1.0 06/17/2014 1211   NITRITE neg 04/18/2023 1411   NITRITE NEGATIVE 03/27/2023 1811   LEUKOCYTESUR Small (1+) (A) 04/18/2023 1411   LEUKOCYTESUR LARGE (A) 03/27/2023 1811   This document was prepared using Dragon Voice Recognition software and may include unintentional dictation errors.  Dr. Sedalia Muta [facility]  If 7PM-7AM, please contact overnight-coverage provider If 7AM-7PM, please contact day attending provider [email or web address]  05/18/2023, 6:18 PM

## 2023-05-18 NOTE — Assessment & Plan Note (Signed)
-   Does not appear to be in acute exacerbation at this time 

## 2023-05-18 NOTE — Assessment & Plan Note (Signed)
[place] As needed nicotine patch ordered Patient endorses readiness to stop tobacco use. Greater than 3 minutes spent on tobacco cessation counseling Tobacco cessation counseling:  Extensive discussion at bedside regarding educating patient that continued tobacco use increases risk of limb amputation due to worsening of peripheral artery disease.  Patient endorses understanding and compliance. If missing the feeling of holding a cigarette, cut a sipping straw to the length of a cigarette and hold it between your fingers Call 1800 QUIT NOW if in need of nicotine patches to help with cessation.  I had patient repeat back to me the number to call for free nicotine patches.

## 2023-05-18 NOTE — Telephone Encounter (Signed)
Lauren from [facility] home health contacted the office to let patient's pcp know that the pt has been admitted to the hospital. States patient's family had some concerns regarding wounds on the patient's left foot and swelling in big toe, decided to take patient to the hospital to have an evaluation.

## 2023-05-18 NOTE — Assessment & Plan Note (Signed)
Home rosuvastatin 40 mg nightly resumed 

## 2023-05-18 NOTE — Assessment & Plan Note (Signed)
-   Hydralazine 5 mg IV every 6 hours as needed for SBP greater 175, 4 days ordered 

## 2023-05-18 NOTE — Assessment & Plan Note (Addendum)
     Vascular service has been consulted by EDP and by Epic order via myself and recommends initiation of heparin and vascular service will see the patient Given elevated D-dimer and patient endorsing redness with pain, DVT cannot be excluded at this time therefore I have ordered a left lower extremity ultrasound to assess for DVT Norco 5 mg every 6 hours as needed for moderate pain, 2 days of coverage ordered; morphine 4 mg IV every 4 hours as needed for severe pain, 20 hours of coverage ordered Post vascular evaluation, a.m. team may consider podiatry consultation for I&D as needed Vancomycin and cefepime per pharmacy ordered MRI of the left foot with and without contrast ordered to assess for osteomyelitis N.p.o. after midnight Admit to telemetry cardiac, inpatient

## 2023-05-18 NOTE — ED Notes (Signed)
Patient transported to MRI 

## 2023-05-18 NOTE — ED Notes (Signed)
Please call sister with updates or with room assignment.

## 2023-05-18 NOTE — Consult Note (Signed)
Hospital Consult    Reason for Consult:  Left Lower Extremity Foot Pain  Requesting Physician:  Dr Londell Moh MD  MRN #:  [id number]  History of Present Illness: This is a 72 y.o. male Anthonyjames Friedel is a 72 y.o. male with a past medical history of hypertension, hyperlipidemia, depression, TIA, presents to the emergency department for left foot redness and swelling with pain.  According to the patient for the past 5 days or so he has noticed increased redness and swelling to the left foot now with an ulceration to the great toe.  Patient states a history of prior lower extremity stents placed by Dr. Gilda Crease in March.  Patient denies any fever.  States neuropathy denies any pain.   On exam this afternoon the patient rests comfortably in a stretcher in the emergency department.  Left lower extremity is warm to touch and reddened.  She may have cellulitis of the foot which extends to the distal knee.  Patient endorses pain at rest as well as exercise.  Patient's sister who is at the bedside states that the patient started smoking a week ago and that is when she noticed his foot swelling and becoming red.  Vascular surgery was consulted to evaluate patient's prior history of peripheral artery disease current symptoms.  Past Medical History:  Diagnosis Date   Basal cell carcinoma 08/11/2022   R forearm - ED&C   Depression    Foot ulcer (HCC) 12/12/2016   Hyperlipidemia    Hypertension    PAD (peripheral artery disease) (HCC) ~2007   s/p R BKA for non-healing wound   Stroke (HCC) 03/2014   MRI: Acute nonhemorrhagic left paracentral pontine infarct. 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   TIA (transient ischemic attack) 01/2014    Past Surgical History:  Procedure Laterality Date   BELOW KNEE LEG AMPUTATION Right    FEMORAL-POPLITEAL BYPASS GRAFT Left 12/17/2016   Procedure: BYPASS LEFT FEMORAL TO BELOW POPLITEAL ARTERY  USING PROPATEN GORE GRAFT;  Surgeon: Larina Earthly, MD;  Location: [facility] OR;  Service: Vascular;  Laterality: Left;   FEMORAL-POPLITEAL BYPASS GRAFT Left 09/30/2017   Procedure: LEFT LEG ANGIOGRAM,  THROMBECTOMY, FEM-POPLITEAL BYPASS GRAFT, tHROMBECTOMY PERONEAL ARTERY AND POSTERIOR TIBIAL , ENDARTERECTOMY TIBIAL/PERONEAL TRUNK WITH BOVINE PATCH ANGIOPLASTY.;  Surgeon: Fransisco Hertz, MD;  Location: [facility] OR;  Service: Vascular;  Laterality: Left;   INTRAMEDULLARY (IM) NAIL INTERTROCHANTERIC Right 03/21/2023   Procedure: INTRAMEDULLARY (IM) NAIL INTERTROCHANTERIC;  Surgeon: Deeann Saint, MD;  Location: [facility] ORS;  Service: Orthopedics;  Laterality: Right;   LOWER EXTREMITY ANGIOGRAPHY Left 12/14/2022   Procedure: Lower Extremity Angiography;  Surgeon: Renford Dills, MD;  Location: [facility] INVASIVE CV LAB;  Service: Cardiovascular;  Laterality: Left;   PERIPHERAL VASCULAR CATHETERIZATION Left 12/14/2016   Procedure: Abdominal Aortogram w/Lower Extremity;  Surgeon: Chuck Hint, MD;  Location: [facility] INVASIVE CV LAB;  Service: Cardiovascular;  Laterality: Left;   right cataract extraction     TRANSTHORACIC ECHOCARDIOGRAM  01/2014   To evaluate possible CVA: EF 55-60%. GR 1 DD. No significant valvular lesions    No Known Allergies  Prior to Admission medications   Medication Sig Start Date End Date Taking? Authorizing Provider  acetaminophen (TYLENOL) 500 MG tablet Take 500 mg by mouth every 6 (six) hours as needed.    [provider]  ascorbic acid (VITAMIN C) 500 MG tablet Take 1 tablet (500 mg total) by mouth daily.  03/26/23 06/24/23  Gillis Santa, MD  aspirin 81 MG tablet Take 81 mg by mouth daily.    [provider]  bisacodyl 5 MG EC tablet Take 2 tablets (10 mg total) by mouth daily as needed for moderate constipation. 03/25/23   Gillis Santa, MD  blood glucose meter kit and supplies KIT Dispense based on patient and insurance preference. Use two times daily as directed  (before breakfast and at bedtime. (FOR ICD-10: E11.51) 02/11/22   Nche, Bonna Gains, NP  clopidogrel (PLAVIX) 75 MG tablet Take 1 tablet (75 mg total) by mouth daily. 12/14/22   Schnier, Latina Craver, MD  cyanocobalamin 1000 MCG tablet Take 1 tablet (1,000 mcg total) by mouth daily. 03/31/23 06/29/23  Gillis Santa, MD  ferrous sulfate 325 (65 FE) MG tablet Take 1 tablet (325 mg total) by mouth daily with breakfast. 03/26/23 06/24/23  Gillis Santa, MD  folic acid (FOLVITE) 1 MG tablet Take 1 tablet (1 mg total) by mouth daily. 03/26/23 06/24/23  Gillis Santa, MD  HYDROcodone-acetaminophen (NORCO/VICODIN) 5-325 MG tablet Take 1 tablet by mouth every 8 (eight) hours as needed for moderate pain. 03/31/23   Lurene Shadow, MD  icosapent Ethyl (VASCEPA) 1 g capsule Take 2 capsules (2 g total) by mouth 2 (two) times daily. 01/27/23 07/26/23  Mort Sawyers, FNP  latanoprost (XALATAN) 0.005 % ophthalmic solution Place 1 drop into both eyes at bedtime. 11/12/22   [provider]  metFORMIN (GLUCOPHAGE) 850 MG tablet Take 1 tablet (850 mg total) by mouth 2 (two) times daily with a meal. 05/09/23 05/03/24  Mort Sawyers, FNP  olmesartan (BENICAR) 40 MG tablet Take 1 tablet (40 mg total) by mouth daily. 05/09/23   Mort Sawyers, FNP  polyethylene glycol (MIRALAX) 17 g packet Take 17 g by mouth daily. 03/25/23   Gillis Santa, MD  rosuvastatin (CRESTOR) 40 MG tablet Take 1 tablet (40 mg total) by mouth daily. 01/31/23   Mort Sawyers, FNP  sertraline (ZOLOFT) 100 MG tablet Take 1 tablet (100 mg total) by mouth daily. 05/09/23   Mort Sawyers, FNP  spironolactone (ALDACTONE) 25 MG tablet Take 1 tablet (25 mg total) by mouth daily. 05/09/23   Mort Sawyers, FNP    Social History   Socioeconomic History   Marital status: Widowed    Spouse name: Not on file   Number of children: 2   Years of education: Not on file   Highest education level: Not on file  Occupational History    Comment: Disabled  Tobacco Use    Smoking status: Some Days    Packs/day: 15.00    Years: 60.00    Additional pack years: 0.00    Total pack years: 900.00    Types: Cigarettes   Smokeless tobacco: Never  Vaping Use   Vaping Use: Never used  Substance and Sexual Activity   Alcohol use: No   Drug use: No   Sexual activity: Not Currently    Birth control/protection: Abstinence  Other Topics Concern   Not on file  Social History Narrative   One boy youngest, killed in MVA    Social Determinants of Health   Financial Resource Strain: Low Risk  (10/08/2021)   Overall Financial Resource Strain (CARDIA)    Difficulty of Paying Living Expenses: Not hard at all  Food Insecurity: No Food Insecurity (03/28/2023)   Hunger Vital Sign    Worried About Running Out of Food in the Last Year: Never true    Ran Out of Food in  the Last Year: Never true  Transportation Needs: No Transportation Needs (03/28/2023)   PRAPARE - Administrator, Civil Service (Medical): No    Lack of Transportation (Non-Medical): No  Physical Activity: Insufficiently Active (04/07/2022)   Exercise Vital Sign    Days of Exercise per Week: 2 days    Minutes of Exercise per Session: 30 min  Stress: No Stress Concern Present (04/07/2022)   [employer] of Occupational Health - Occupational Stress Questionnaire    Feeling of Stress : Not at all  Social Connections: Socially Isolated (04/07/2022)   Social Connection and Isolation Panel [NHANES]    Frequency of Communication with Friends and Family: Three times a week    Frequency of Social Gatherings with Friends and Family: Three times a week    Attends Religious Services: Never    Active Member of Clubs or Organizations: No    Attends Banker Meetings: Never    Marital Status: Widowed  Intimate Partner Violence: Not At Risk (03/28/2023)   Humiliation, Afraid, Rape, and Kick questionnaire    Fear of Current or Ex-Partner: No    Emotionally Abused: No    Physically Abused: No     Sexually Abused: No     Family History  Problem Relation Age of Onset   Hypertension Mother        Does not know history   Heart disease Mother    Stroke Mother    Diabetes Mother    Hypertension Father    Heart disease Father    Stroke Father    Diabetes Sister    Hypertension Sister    Heart disease Brother    Hypertension Brother     ROS: Otherwise negative unless mentioned in HPI  Physical Examination  Vitals:   05/18/23 1140 05/18/23 1153  BP: 105/69   Pulse: 78   Resp: 18   Temp:  98.3 F (36.8 C)  SpO2: 97%    Body mass index is 18.73 kg/m.  General:  WDWN in NAD Gait: Not observed HENT: WNL, normocephalic Pulmonary: normal non-labored breathing, without Rales, rhonchi,  wheezing Cardiac: regular, without  Murmurs, rubs or gallops; without carotid bruits Abdomen: Bowel sounds throughout, soft, NT/ND, no masses Skin: without rashes Vascular Exam/Pulses: Lower extremity BKA, left lower extremity unable to palpate PT/DP pulses due to +2 edema. Extremities: with ischemic changes, without Gangrene , with cellulitis; without open wounds;  Musculoskeletal: no muscle wasting or atrophy  Neurologic: A&O X 3;  No focal weakness or paresthesias are detected; speech is fluent/normal Psychiatric:  The pt has Normal affect. Lymph:  Unremarkable  CBC    Component Value Date/Time   WBC 11.4 (H) 05/18/2023 1148   RBC 3.57 (L) 05/18/2023 1148   HGB 10.6 (L) 05/18/2023 1148   HCT 33.5 (L) 05/18/2023 1148   PLT 274 05/18/2023 1148   MCV 93.8 05/18/2023 1148   MCH 29.7 05/18/2023 1148   MCHC 31.6 05/18/2023 1148   RDW 16.4 (H) 05/18/2023 1148   LYMPHSABS 3.0 05/18/2023 1148   MONOABS 0.6 05/18/2023 1148   EOSABS 0.3 05/18/2023 1148   BASOSABS 0.1 05/18/2023 1148    BMET    Component Value Date/Time   NA 141 05/18/2023 1148   NA 139 02/03/2016 0000   K 4.8 05/18/2023 1148   CL 112 (H) 05/18/2023 1148   CO2 19 (L) 05/18/2023 1148   GLUCOSE 122 (H)  05/18/2023 1148   BUN 27 (H) 05/18/2023 1148   BUN 28 (  A) 02/03/2016 0000   CREATININE 1.24 05/18/2023 1148   CALCIUM 8.0 (L) 05/18/2023 1148   GFRNONAA >60 05/18/2023 1148   GFRAA >60 10/02/2017 0943    COAGS: Lab Results  Component Value Date   INR 1.1 03/21/2023   INR 1.00 09/30/2017   INR 1.08 12/14/2016     Non-Invasive Vascular Imaging:   MRI Left Foot = No results at this time  EXAM:05/18/2023 Left LOWER EXTREMITY VENOUS DOPPLER ULTRASOUND   TECHNIQUE: Gray-scale sonography with compression, as well as color and duplex ultrasound, were performed to evaluate the deep venous system(s) from the level of the common femoral vein through the popliteal and proximal calf veins.   COMPARISON:  12/14/2022   FINDINGS: VENOUS   Normal compressibility of the common femoral, superficial femoral, and popliteal veins, as well as the visualized calf veins. Visualized portions of profunda femoral vein and great saphenous vein unremarkable. No filling defects to suggest DVT on grayscale or color Doppler imaging. Doppler waveforms show normal direction of venous flow, normal respiratory plasticity and response to augmentation.   Limited views of the contralateral common femoral vein are unremarkable.  EXAM:05/18/2023 LEFT FOOT - COMPLETE 3+ VIEW   COMPARISON:  12/12/2016   FINDINGS: No recent displaced fracture or dislocation is seen. Erosive changes are noted in the tip of distal phalanx of big toe. Distal portion of proximal phalanx of fourth toe and proximal portion of middle phalanx of the left fourth toe are missing. This may be residual from previous intervention.   IMPRESSION: No recent fracture or dislocation is seen. Erosions are seen in the tip of distal phalanx of left big toe. If there is clinical suspicion for osteomyelitis, follow-up MRI may be considered.    Statin:  Yes.   Beta Blocker:  No. Aspirin:  Yes.   ACEI:  No. ARB:  Yes.   CCB use:   No Other antiplatelets/anticoagulants:  Yes.   Plavix 75 mg Daily   ASSESSMENT/PLAN: This is a 73 y.o. male is to [facility] emergency department today for left foot pain with swelling.  It appears the patient may have some cellulitis associated with +2 edema to his left foot.  To the patient's sister the patient started smoking a week ago and she correlates this to when his symptoms started.  PLAN: Vascular surgery plans on taking the patient to vascular lab either later this week or early next week for a left lower extremity angiogram with possible intervention.  I discussed in detail with the patient and his sister at the bedside the procedure, benefits, risks, and complications.  Both verbalized their understanding.  I answered all their questions this afternoon.  Both would like to proceed as soon as possible.  Patient will be made n.p.o. at midnight before the day of procedure.   -Discussed the plan in detail with Dr. [name] MD and Dr. Vilinda Flake MD who both agree with the plan.   Marcie Bal [facility] 05/18/2023 2:44 PM

## 2023-05-18 NOTE — Assessment & Plan Note (Signed)
Resumed home aspirin 81 mg daily, rosuvastatin 40 mg nightly

## 2023-05-18 NOTE — Progress Notes (Signed)
Pharmacy Antibiotic Note  Jordan Arellano is a 72 y.o. male admitted on 05/18/2023 with  osteomyelitis .  Pharmacy has been consulted for vancomycin and cefepime dosing.  Scr 1.24 - near baseline. Xray of foot: "Erosions are seen in the tip of distal phalanx of left big toe" - MRI has been ordered.  Plan: Vancomycin IV 1500 mg x 1 loading dose Vancomycin IV 1000 mg every 24 hours Goal AUC 400-550 Estimated AUC 465.7, Cmin 11.1 Used TBW, Vd 0.72, Scr 1.24  Cefepime 2 grams every 12 hours (CrCl 30-60 mL/min)  Weight: 64.4 kg (142 lb)  Temp (24hrs), Avg:98.3 F (36.8 C), Min:98.3 F (36.8 C), Max:98.3 F (36.8 C)  Recent Labs  Lab 05/18/23 1148  WBC 11.4*  CREATININE 1.24    Estimated Creatinine Clearance: 50.5 mL/min (by C-G formula based on SCr of 1.24 mg/dL).    No Known Allergies  Antimicrobials this admission: Ceftriaxone x 1  Vancomycin 6/12 >>  Cefepime 6/12 >>  Dose adjustments this admission: N/a  Microbiology results: None  Thank you for allowing pharmacy to be a part of this patient's care.  Elliot Gurney, PharmD, BCPS Clinical Pharmacist  05/18/2023 6:22 PM

## 2023-05-18 NOTE — ED Triage Notes (Addendum)
Pt presents to the ED due to L foot pain. Pt states he noticed he was having L foot pain about 2 days ago. Pt recently had a stent placed due to "poor circulation" in L leg. Pt l toes are inflamed and red. L pedal pulse present. Pt is A&Ox4

## 2023-05-18 NOTE — Telephone Encounter (Signed)
Pt's sister called in wanting to bring pt to the clinic this morning. Stating that she went to rehab unit where pt is currently and "in his good foot", it was red, painful and very very swollen. She states that it was cold to the touch and it has been that way for 2 days. I explained that we did not have a provider in the office yet to give medical advice. I offered to put a note in and the sister went back and forth with waiting to hear back from a provider here or just take him straight to the ED. I explained that unfortunately I could not give medical advice but if she was feeling that she should take him to the ED then that may be her best bet. I explained that if there was a blockage or something of the sorts that we would not be able to do any intervention in the office. Pt's sister ultimately decided that she would go get pt and take him to the ED. I advised to call us at the clinic if she needed anything. Pt's sister acknowledged.

## 2023-05-18 NOTE — Assessment & Plan Note (Signed)
Resumed home daily GlycoLax Senna docusate nightly as needed for mild constipation, Dulcolax daily as needed for moderate constipation

## 2023-05-19 ENCOUNTER — Ambulatory Visit: Payer: 59

## 2023-05-19 ENCOUNTER — Ambulatory Visit: Payer: 59 | Admitting: Physician Assistant

## 2023-05-19 DIAGNOSIS — M86072 Acute hematogenous osteomyelitis, left ankle and foot: Secondary | ICD-10-CM | POA: Diagnosis not present

## 2023-05-19 DIAGNOSIS — I7025 Atherosclerosis of native arteries of other extremities with ulceration: Secondary | ICD-10-CM | POA: Diagnosis not present

## 2023-05-19 DIAGNOSIS — Z720 Tobacco use: Secondary | ICD-10-CM | POA: Diagnosis not present

## 2023-05-19 DIAGNOSIS — L97509 Non-pressure chronic ulcer of other part of unspecified foot with unspecified severity: Secondary | ICD-10-CM | POA: Diagnosis not present

## 2023-05-19 HISTORY — DX: Acute hematogenous osteomyelitis, left ankle and foot: M86.072

## 2023-05-19 LAB — BASIC METABOLIC PANEL
Anion gap: 8 (ref 5–15)
BUN: 25 mg/dL — ABNORMAL HIGH (ref 8–23)
CO2: 20 mmol/L — ABNORMAL LOW (ref 22–32)
Calcium: 9.1 mg/dL (ref 8.9–10.3)
Chloride: 111 mmol/L (ref 98–111)
Creatinine, Ser: 1.11 mg/dL (ref 0.61–1.24)
GFR, Estimated: >60 mL/min (ref >60)
Glucose, Bld: 128 mg/dL — ABNORMAL HIGH (ref 70–99)
Potassium: 4.5 mmol/L (ref 3.5–5.1)
Sodium: 139 mmol/L (ref 135–145)

## 2023-05-19 LAB — CBC
HCT: 30.4 % — ABNORMAL LOW (ref 39.0–52.0)
Hemoglobin: 9.6 g/dL — ABNORMAL LOW (ref 13.0–17.0)
MCH: 29.4 pg (ref 26.0–34.0)
MCHC: 31.6 g/dL (ref 30.0–36.0)
MCV: 93.3 fL (ref 80.0–100.0)
Platelets: 237 10*3/uL (ref 150–400)
RBC: 3.26 MIL/uL — ABNORMAL LOW (ref 4.22–5.81)
RDW: 16.4 % — ABNORMAL HIGH (ref 11.5–15.5)
WBC: 10 10*3/uL (ref 4.0–10.5)
nRBC: 0 % (ref 0.0–0.2)

## 2023-05-19 LAB — GLUCOSE, CAPILLARY: Glucose-Capillary: 92 mg/dL (ref 70–99)

## 2023-05-19 LAB — VITAMIN B12: Vitamin B-12: 893 pg/mL (ref 180–914)

## 2023-05-19 LAB — CBG MONITORING, ED
Glucose-Capillary: 112 mg/dL — ABNORMAL HIGH (ref 70–99)
Glucose-Capillary: 116 mg/dL — ABNORMAL HIGH (ref 70–99)

## 2023-05-19 LAB — IRON AND TIBC
Iron: 55 ug/dL (ref 45–182)
Saturation Ratios: 24 % (ref 17.9–39.5)
TIBC: 228 ug/dL — ABNORMAL LOW (ref 250–450)
UIBC: 173 ug/dL

## 2023-05-19 LAB — HEPARIN LEVEL (UNFRACTIONATED)
Heparin Unfractionated: 0.21 IU/mL — ABNORMAL LOW (ref 0.30–0.70)
Heparin Unfractionated: 0.37 IU/mL (ref 0.30–0.70)
Heparin Unfractionated: 0.33 IU/mL (ref 0.30–0.70)

## 2023-05-19 LAB — FERRITIN: Ferritin: 205 ng/mL (ref 24–336)

## 2023-05-19 MED ORDER — HEPARIN BOLUS VIA INFUSION
950.0000 [IU] | Freq: Once | INTRAVENOUS | Status: AC
  Administered 2023-05-19: 950 [IU] via INTRAVENOUS
  Filled 2023-05-19: qty 950

## 2023-05-19 MED ORDER — VANCOMYCIN HCL 1250 MG/250ML IV SOLN
1250.0000 mg | INTRAVENOUS | Status: DC
  Administered 2023-05-19 – 2023-05-21 (×2): 1250 mg via INTRAVENOUS
  Filled 2023-05-19 (×2): qty 250

## 2023-05-19 MED ORDER — SERTRALINE HCL 50 MG PO TABS
100.0000 mg | ORAL_TABLET | Freq: Every day | ORAL | Status: DC
  Administered 2023-05-19 – 2023-06-03 (×16): 100 mg via ORAL
  Filled 2023-05-19 (×16): qty 2

## 2023-05-19 NOTE — Progress Notes (Addendum)
Pharmacy Antibiotic Note  Jordan Arellano is a 72 y.o. 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  Pharmacy has been consulted for vancomycin and cefepime dosing. Patient presented with left foot pain and redness with swelling. On presentation the patient stated having a left foot wound lasting 3-4 days. Patient has a history of PAD with previous revascularization. Day 2 of ABX therapy, WBC have improved since starting ABX.  6/12 - Scr 1.24 - near baseline. Xray of foot: "Erosions are seen in the tip of distal phalanx of left big toe" - MRI has been ordered. 6/13 - Scr 1.11 - MRI showed a small ulceration at the distal aspect of the great toe with acute osteomyelitis of the great toe distal phalanx and acute osteomyelitis of the distal phalanx of the third toe.  Plan: Increase scheduled vancomycin IV to 1250 mg every 24 hours Goal AUC 400-550 Estimated AUC 526.4, Cmin 12.0 Used TBW, Vd 0.72, Scr 1.11  Cefepime 2 grams every 12 hours (CrCl 30-60 mL/min, currently 56.4 mL/min)  Weight: 64.4 kg (142 lb)  Temp (24hrs), Avg:98.2 F (36.8 C), Min:97.8 F (36.6 C), Max:98.4 F (36.9 C)  Recent Labs  Lab 05/18/23 1148 05/19/23 0606  WBC 11.4* 10.0  CREATININE 1.24 1.11     Estimated Creatinine Clearance: 56.4 mL/min (by C-G formula based on SCr of 1.11 mg/dL).    No Known Allergies  Antimicrobials this admission: Ceftriaxone x 1  Vancomycin 6/12 >>  Cefepime 6/12 >>  Dose adjustments this admission: Vanocmycin 1000 mg Q24h >> 1250 mg Q24h  Microbiology results: No cultures ordered  Thank you for allowing pharmacy to be a part of this patient's care.  Francetta Found, PharmD Candidate Class of 2025  05/19/2023 10:01 AM

## 2023-05-19 NOTE — Consult Note (Signed)
ANTICOAGULATION CONSULT NOTE   Pharmacy Consult for heparin infusion Indication:  Lower extremity ischemia  No Known Allergies  Patient Measurements: Weight: 64.4 kg (142 lb) Heparin Dosing Weight: 64.4 kg  Vital Signs: Temp: 98.4 F (36.9 C) (06/13 0355) Temp Source: Oral (06/13 0355) BP: 103/56 (06/13 0330) Pulse Rate: 67 (06/13 0330)  Labs: Recent Labs    05/18/23 1148 05/18/23 1521 05/18/23 2200 05/19/23 0606  HGB 10.6*  --   --  9.6*  HCT 33.5*  --   --  30.4*  PLT 274  --   --  237  APTT  --  36  --   --   LABPROT  --  14.3  --   --   INR  --  1.1  --   --   HEPARINUNFRC  --   --  0.30 0.21*  CREATININE 1.24  --   --  1.11     Estimated Creatinine Clearance: 56.4 mL/min (by C-G formula based on SCr of 1.11 mg/dL).   Medical History: Past Medical History:  Diagnosis Date   Basal cell carcinoma 08/11/2022   R forearm - ED&C   Depression    Foot ulcer (HCC) 12/12/2016   Hyperlipidemia    Hypertension    PAD (peripheral artery disease) (HCC) ~2007   s/p R BKA for non-healing wound   Stroke (HCC) 03/2014   MRI: Acute nonhemorrhagic left paracentral pontine infarct. 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   TIA (transient ischemic attack) 01/2014    Medications:  No prior anticoagulation noted   Assessment: 71 year old male with history of hypertension, hyperlipidemia, depression, TIA, who presents to the emergency department for chief concerns of left foot pain and redness with swelling. Pt with history of prior lower extremity stents placed 02/2023. Pharmacy has been consulted to initiate and manage IV heparin therapy for lower extremity aterial occlusion   Goal of Therapy:  Heparin level 0.3-0.7 units/ml Monitor platelets by anticoagulation protocol: Yes   06/12 2200 HL 0.30, therapeutic x 1 06/13 0606 HL 0.21, subtherapeutic @ 1100 u/hr  Plan:  HL subtherapeutic Give  heparin 950 units IV x 1 Increase heparin infusion rate to 1200 units/hr Check HL in 6 hours after rate change Daily CBC while on heparin  Barrie Folk, PharmD 05/19/2023 7:25 AM

## 2023-05-19 NOTE — Progress Notes (Signed)
  Progress Note   Patient: Jordan Arellano NUU:[id number] DOB: 1951/03/23 DOA: 05/18/2023     1 DOS: the patient was seen and examined on 05/19/2023   Brief hospital course: [name] is a 72 year old male with history of hypertension, hyperlipidemia, depression, TIA, who presents to the emergency department for chief concerns of left foot pain and redness with swelling. Patient had a significant ischemia, seen by vascular surgery, scheduled for angiogram 6/14.  Patient is also placed on broad-spectrum antibiotics for osteomyelitis.  MRI of the foot showed osteomyelitis of the first and third toes.  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) Active Problems:   Essential hypertension   Dyslipidemia   Depression   History of CVA (cerebrovascular accident) without residual deficits   Tobacco abuse   PAD s/p right BKA, history revascularization left leg   Hypertriglyceridemia   Status post femoral-popliteal bypass surgery   Left renal artery stenosis (HCC)   Recurrent major depressive disorder, in full remission (HCC)   Heart failure with preserved ejection fraction (HCC)   Encounter for tobacco use cessation counseling   At risk for constipation   Acute hematogenous osteomyelitis of left foot (HCC)   Assessment and Plan: Left lower extremity ischemia with ulceration. Acute osteomyelitis of the left foot. Status post right BKA. Tobacco abuse. Patient currently covered with cefepime and vancomycin for osteomyelitis. Vascular surgery has a plan for angioplasty tomorrow, discussed with podiatry, subsequent toe amputation will be performed on Saturday. Will continue heparin drip, discontinued Plavix.  Essential hypertension  Chronic diastolic congestive heart failure. Continue home medicines.  History of CVA. Continue home medicines, hold antiplatelets for  surgery.   Subjective:  Patient doing well today, pain under control.  No short of breath or cough.  Physical Exam: Vitals:   05/19/23 0800 05/19/23 0837 05/19/23 1100 05/19/23 1126  BP:   104/61   Pulse: 70  65   Resp: 18  16   Temp:  97.8 F (36.6 C)  97.6 F (36.4 C)  TempSrc:  Oral  Oral  SpO2: 98%  100%   Weight:       General exam: Appears calm and comfortable  Respiratory system: Clear to auscultation. Respiratory effort normal. Cardiovascular system: S1 & S2 heard, RRR. No JVD, murmurs, rubs, gallops or clicks. No pedal edema. Gastrointestinal system: Abdomen is nondistended, soft and nontender. No organomegaly or masses felt. Normal bowel sounds heard. Central nervous system: Alert and oriented. No focal neurological deficits. Extremities: Right BKA, left foot is still red. Skin: No rashes, lesions or ulcers Psychiatry: Judgement and insight appear normal. Mood & affect appropriate.    Data Reviewed:  Reviewed MRI results, lab results.  Family Communication: None  Disposition: Status is: Inpatient Remains inpatient appropriate because: Severity of disease, IV treatment, inpatient procedure.     Time spent: 35 minutes  Author: Marrion Coy, MD 05/19/2023 1:00 PM  For on call review [email or web address].

## 2023-05-19 NOTE — Consult Note (Signed)
ANTICOAGULATION CONSULT NOTE   Pharmacy Consult for heparin infusion Indication:  Lower extremity ischemia  No Known Allergies  Patient Measurements: Weight: 64.4 kg (142 lb) Heparin Dosing Weight: 64.4 kg  Vital Signs: Temp: 98.1 F (36.7 C) (06/13 1700) Temp Source: Oral (06/13 1700) BP: 119/68 (06/13 1700) Pulse Rate: 69 (06/13 1700)  Labs: Recent Labs    05/18/23 1148 05/18/23 1521 05/18/23 2200 05/19/23 0606 05/19/23 1648  HGB 10.6*  --   --  9.6*  --   HCT 33.5*  --   --  30.4*  --   PLT 274  --   --  237  --   APTT  --  36  --   --   --   LABPROT  --  14.3  --   --   --   INR  --  1.1  --   --   --   HEPARINUNFRC  --   --  0.30 0.21* 0.37  CREATININE 1.24  --   --  1.11  --      Estimated Creatinine Clearance: 56.4 mL/min (by C-G formula based on SCr of 1.11 mg/dL).   Medical History: Past Medical History:  Diagnosis Date   Basal cell carcinoma 08/11/2022   R forearm - ED&C   Depression    Foot ulcer (HCC) 12/12/2016   Hyperlipidemia    Hypertension    PAD (peripheral artery disease) (HCC) ~2007   s/p R BKA for non-healing wound   Stroke (HCC) 03/2014   MRI: Acute nonhemorrhagic left paracentral pontine infarct. 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   TIA (transient ischemic attack) 01/2014    Medications:  No prior anticoagulation noted   Assessment: 72 year old male with history of hypertension, hyperlipidemia, depression, TIA, who presents to the emergency department for chief concerns of left foot pain and redness with swelling. Pt with history of prior lower extremity stents placed 02/2023. Pharmacy has been consulted to initiate and manage IV heparin therapy for lower extremity aterial occlusion   Goal of Therapy:  Heparin level 0.3-0.7 units/ml Monitor platelets by anticoagulation protocol: Yes   06/12 2200 HL 0.30, therapeutic x 1 06/13 0606 HL 0.21,  subtherapeutic @ 1100 u/hr 06/13 1648 HL 0.37 therapeutic x 1 @ 1200 units/hr  Plan:  HL therapeutic Continue heparin infusion at 1200 units/hr Check HL in 6 hours to confirm Daily CBC while on heparin  Sharen Hones, PharmD, BCPS Clinical Pharmacist   05/19/2023 6:01 PM

## 2023-05-19 NOTE — Telephone Encounter (Signed)
Noted  

## 2023-05-19 NOTE — H&P (View-Only) (Signed)
Reason for Consult: Osteomyelitis left first and third toes. Referring Physician: Zhang  Judson Efferson is an 72 y.o. male.  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  Recently noticed some sores on the tips of his toes on the left foot and recently had some increased redness and swelling.  Presented to the emergency department for evaluation.  Bone infection confirmed with MRI in the first and third toes.  Past Medical History:  Diagnosis Date   Basal cell carcinoma 08/11/2022   R forearm - ED&C   Depression    Foot ulcer (HCC) 12/12/2016   Hyperlipidemia    Hypertension    PAD (peripheral artery disease) (HCC) ~2007   s/p R BKA for non-healing wound   Stroke (HCC) 03/2014   MRI: Acute nonhemorrhagic left paracentral pontine infarct. 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   TIA (transient ischemic attack) 01/2014    Past Surgical History:  Procedure Laterality Date   BELOW KNEE LEG AMPUTATION Right    FEMORAL-POPLITEAL BYPASS GRAFT Left 12/17/2016   Procedure: BYPASS LEFT FEMORAL TO BELOW POPLITEAL ARTERY USING PROPATEN GORE GRAFT;  Surgeon: Shaneque Merkle F Early, MD;  Location: [facility] OR;  Service: Vascular;  Laterality: Left;   FEMORAL-POPLITEAL BYPASS GRAFT Left 09/30/2017   Procedure: LEFT LEG ANGIOGRAM,  THROMBECTOMY, FEM-POPLITEAL BYPASS GRAFT, tHROMBECTOMY PERONEAL ARTERY AND POSTERIOR TIBIAL , ENDARTERECTOMY TIBIAL/PERONEAL TRUNK WITH BOVINE PATCH ANGIOPLASTY.;  Surgeon: Chen, Brian L, MD;  Location: [facility] OR;  Service: Vascular;  Laterality: Left;   INTRAMEDULLARY (IM) NAIL INTERTROCHANTERIC Right 03/21/2023   Procedure: INTRAMEDULLARY (IM) NAIL INTERTROCHANTERIC;  Surgeon: Miller, Howard, MD;  Location: [facility] ORS;  Service: Orthopedics;  Laterality: Right;   LOWER EXTREMITY  ANGIOGRAPHY Left 12/14/2022   Procedure: Lower Extremity Angiography;  Surgeon: Schnier, Gregory G, MD;  Location: [facility] INVASIVE CV LAB;  Service: Cardiovascular;  Laterality: Left;   PERIPHERAL VASCULAR CATHETERIZATION Left 12/14/2016   Procedure: Abdominal Aortogram w/Lower Extremity;  Surgeon: Christopher S Dickson, MD;  Location: [facility] INVAS[facility] CV [facility];  Service: Cardiovascular;  Laterality: Left;   right cataract extraction     TRANSTHORACIC ECHOCARDIOGRAM  01/2014   To evaluate possible CVA: EF 55-60%. GR 1 DD. No significant valvular lesions    Family History  Problem Relation Age of Onset   Hypertension Mother        Does not know history   Heart disease Mother    Stroke Mother    Diabetes Mother    Hypertension Father    Heart disease Father    Stroke Father    Diabetes Sister    Hypertension Sister    Heart disease Brother    Hypertension Brother     Social History:  reports that he has been smoking cigarettes. He has a 900.00 pack-year smoking history. He has never used smokeless tobacco. He reports that he does not drink alcohol and does not use drugs.  Allergies: No Known Allergies  Medications: Scheduled:  aspirin EC  81 mg Oral Daily   cyanocobalamin  1,000 mcg Oral Daily   folic acid  1 mg Oral Daily   insulin aspart  0-15 Units Subcutaneous TID WC   insulin aspart  0-5 Units Subcutaneous QHS   polyethylene glycol  17 g Oral Daily   rosuvastatin  40 mg Oral QHS    Results for orders placed   or performed during the hospital encounter of 05/18/23 (from the past 48 hour(s))  CBC with Differential     Status: Abnormal   Collection Time: 05/18/23 11:48 AM  Result Value Ref Range   WBC 11.4 (H) 4.0 - 10.5 K/uL   RBC 3.57 (L) 4.22 - 5.81 MIL/uL   Hemoglobin 10.6 (L) 13.0 - 17.0 g/dL   HCT 33.5 (L) 39.0 - 52.0 %   MCV 93.8 80.0 - 100.0 fL   MCH 29.7 26.0 - 34.0 pg   MCHC 31.6 30.0 - 36.0 g/dL   RDW 16.4 (H) 11.5 - 15.5 %   Platelets 274 150 - 400 K/uL   nRBC 0.0  0.0 - 0.2 %   Neutrophils Relative % 64 %   Neutro Abs 7.4 1.7 - 7.7 K/uL   Lymphocytes Relative 26 %   Lymphs Abs 3.0 0.7 - 4.0 K/uL   Monocytes Relative 6 %   Monocytes Absolute 0.6 0.1 - 1.0 K/uL   Eosinophils Relative 3 %   Eosinophils Absolute 0.3 0.0 - 0.5 K/uL   Basophils Relative 1 %   Basophils Absolute 0.1 0.0 - 0.1 K/uL   Immature Granulocytes 0 %   Abs Immature Granulocytes 0.04 0.00 - 0.07 K/uL    Comment: Performed at [facility], [address] [place], [place] [address]  Comprehensive metabolic panel     Status: Abnormal   Collection Time: 05/18/23 11:48 AM  Result Value Ref Range   Sodium 141 135 - 145 mmol/L   Potassium 4.8 3.5 - 5.1 mmol/L   Chloride 112 (H) 98 - 111 mmol/L   CO2 19 (L) 22 - 32 mmol/L   Glucose, Bld 122 (H) 70 - 99 mg/dL    Comment: Glucose reference range applies only to samples taken after fasting for at least 8 hours.   BUN 27 (H) 8 - 23 mg/dL   Creatinine, Ser 1.24 0.61 - 1.24 mg/dL   Calcium 8.0 (L) 8.9 - 10.3 mg/dL   Total Protein 7.6 6.5 - 8.1 g/dL   Albumin 3.8 3.5 - 5.0 g/dL   AST 19 15 - 41 U/L   ALT 11 0 - 44 U/L   Alkaline Phosphatase 68 38 - 126 U/L   Total Bilirubin 0.1 (L) 0.3 - 1.2 mg/dL   GFR, Estimated >60 >60 mL/min    Comment: (NOTE) Calculated using the CKD-EPI Creatinine Equation (2021)    Anion gap 10 5 - 15    Comment: Performed at [facility], [address] [place], [place] [address]  D-dimer, quantitative     Status: Abnormal   Collection Time: 05/18/23 11:48 AM  Result Value Ref Range   D-Dimer, Quant 1.53 (H) 0.00 - 0.50 ug/mL-FEU    Comment: (NOTE) At the manufacturer cut-off value of 0.5 g/mL FEU, this assay has a negative predictive value of 95-100%.This assay is intended for use in conjunction with a clinical pretest probability (PTP) assessment model to exclude pulmonary embolism (PE) and deep venous thrombosis (DVT) in outpatients suspected of PE or DVT. Results should be  correlated with clinical presentation. Performed at [facility], [address] [place], [place] [address]   APTT     Status: None   Collection Time: 05/18/23  3:21 PM  Result Value Ref Range   aPTT 36 24 - 36 seconds    Comment: Performed at [facility], [address] [place], [place] [address]  Protime-INR     Status: None   Collection   Time: 05/18/23  3:21 PM  Result Value Ref Range   Prothrombin Time 14.3 11.4 - 15.2 seconds   INR 1.1 0.8 - 1.2    Comment: (NOTE) INR goal varies based on device and disease states. Performed at [facility], [address] [place], [place] [address]   CBG monitoring, ED     Status: Abnormal   Collection Time: 05/18/23  4:59 PM  Result Value Ref Range   Glucose-Capillary 164 (H) 70 - 99 mg/dL    Comment: Glucose reference range applies only to samples taken after fasting for at least 8 hours.  Heparin level (unfractionated)     Status: None   Collection Time: 05/18/23 10:00 PM  Result Value Ref Range   Heparin Unfractionated 0.30 0.30 - 0.70 IU/mL    Comment: (NOTE) The clinical reportable range upper limit is being lowered to >1.10 to align with the FDA approved guidance for the current laboratory assay.  If heparin results are below expected values, and patient dosage has  been confirmed, suggest follow up testing of antithrombin III levels. Performed at [facility], [address] [place], [place] [address]   CBG monitoring, ED     Status: Abnormal   Collection Time: 05/18/23 11:46 PM  Result Value Ref Range   Glucose-Capillary 104 (H) 70 - 99 mg/dL    Comment: Glucose reference range applies only to samples taken after fasting for at least 8 hours.  Basic metabolic panel     Status: Abnormal   Collection Time: 05/19/23  6:06 AM  Result Value Ref Range   Sodium 139 135 - 145 mmol/L   Potassium 4.5 3.5 - 5.1 mmol/L   Chloride 111 98 - 111 mmol/L   CO2 20 (L) 22 - 32 mmol/L   Glucose,  Bld 128 (H) 70 - 99 mg/dL    Comment: Glucose reference range applies only to samples taken after fasting for at least 8 hours.   BUN 25 (H) 8 - 23 mg/dL   Creatinine, Ser 1.11 0.61 - 1.24 mg/dL   Calcium 9.1 8.9 - 10.3 mg/dL   GFR, Estimated >60 >60 mL/min    Comment: (NOTE) Calculated using the CKD-EPI Creatinine Equation (2021)    Anion gap 8 5 - 15    Comment: Performed at [facility], [address] [place], [place] [address]  CBC     Status: Abnormal   Collection Time: 05/19/23  6:06 AM  Result Value Ref Range   WBC 10.0 4.0 - 10.5 K/uL   RBC 3.26 (L) 4.22 - 5.81 MIL/uL   Hemoglobin 9.6 (L) 13.0 - 17.0 g/dL   HCT 30.4 (L) 39.0 - 52.0 %   MCV 93.3 80.0 - 100.0 fL   MCH 29.4 26.0 - 34.0 pg   MCHC 31.6 30.0 - 36.0 g/dL   RDW 16.4 (H) 11.5 - 15.5 %   Platelets 237 150 - 400 K/uL   nRBC 0.0 0.0 - 0.2 %    Comment: Performed at  Hospital Lab, [address] , [place] [address]  Heparin level (unfractionated)     Status: Abnormal   Collection Time: 05/19/23  6:06 AM  Result Value Ref Range   Heparin Unfractionated 0.21 (L) 0.30 - 0.70 IU/mL    Comment: (NOTE) The clinical reportable range upper limit is being lowered to >1.10 to align with the FDA approved guidance for the current laboratory assay.  If heparin results are below expected values, and patient dosage has    been confirmed, suggest follow up testing of antithrombin III levels. Performed at [facility], [address] [place], [place] [address]   Iron and TIBC     Status: Abnormal   Collection Time: 05/19/23  6:06 AM  Result Value Ref Range   Iron 55 45 - 182 ug/dL   TIBC 228 (L) 250 - 450 ug/dL   Saturation Ratios 24 17.9 - 39.5 %   UIBC 173 ug/dL    Comment: Performed at [facility], [address] [place], [place] [address]  Ferritin     Status: None   Collection Time: 05/19/23  6:06 AM  Result Value Ref Range   Ferritin 205 24 - 336 ng/mL    Comment:  Performed at [facility], [address] [name], [place] [address]  CBG monitoring, ED     Status: Abnormal   Collection Time: 05/19/23  7:46 AM  Result Value Ref Range   Glucose-Capillary 112 (H) 70 - 99 mg/dL    Comment: Glucose reference range applies only to samples taken after fasting for at least 8 hours.   Comment 1 Notify RN    Comment 2 Document in Chart   CBG monitoring, ED     Status: Abnormal   Collection Time: 05/19/23 11:30 AM  Result Value Ref Range   Glucose-Capillary 116 (H) 70 - 99 mg/dL    Comment: Glucose reference range applies only to samples taken after fasting for at least 8 hours.    MR FOOT LEFT W WO CONTRAST  Result Date: 05/19/2023 CLINICAL DATA:  Soft tissue infection suspected, foot, xray done osteomyelitis suspected EXAM: MRI OF THE LEFT FOREFOOT WITHOUT AND WITH CONTRAST TECHNIQUE: Multiplanar, multisequence MR imaging of the left forefoot was performed both before and after administration of intravenous contrast. CONTRAST:  6mL GADAVIST GADOBUTROL 1 MMOL/ML IV SOLN COMPARISON:  05/18/2023 FINDINGS: Bones/Joint/Cartilage Bone marrow edema and enhancement throughout the distal phalanx of the left great toe. Confluent low T1 marrow signal changes within the tuft of the great toe distal phalanx compatible with acute osteomyelitis. Trace joint fluid within the great toe IP joint, nonspecific. Preserved marrow signal within the proximal phalanx of the great toe. There is also bone marrow edema and enhancement within the distal phalanx of the third toe with intermediate-low T1 marrow signal. Preserved signal within the proximal middle phalanx of third toe. Remaining osseous structures of the forefoot demonstrate normal bone marrow signal. No additional sites of osteomyelitis. No acute fracture or dislocation. Hammertoe deformities of the second through fourth toes. Ligaments Intact Lisfranc ligament.  Intact collateral ligaments. Muscles and Tendons Chronic  denervation changes of the foot musculature. Intact flexor and extensor tendons. No significant tenosynovial fluid collection. Soft tissues Small ulceration at the distal aspect of the great toe with mild soft tissue edema and enhancement. No organized or drainable fluid collections. IMPRESSION: 1. Small ulceration at the distal aspect of the great toe with acute osteomyelitis of the great toe distal phalanx. 2. Acute osteomyelitis of the distal phalanx of the third toe. 3. Trace joint fluid within the great toe IP joint, nonspecific and may be reactive. Septic arthritis not excluded. Electronically Signed   By: Nicholas  Plundo D.O.   On: 05/19/2023 08:11   US Venous Img Lower Unilateral Left (DVT)  Result Date: 05/18/2023 CLINICAL DATA:  Left leg pain and swelling EXAM: Left LOWER EXTREMITY VENOUS DOPPLER ULTRASOUND TECHNIQUE: Gray-scale sonography with compression, as well as color and duplex ultrasound, were performed to evaluate   the deep venous system(s) from the level of the common femoral vein through the popliteal and proximal calf veins. COMPARISON:  12/14/2022 FINDINGS: VENOUS Normal compressibility of the common femoral, superficial femoral, and popliteal veins, as well as the visualized calf veins. Visualized portions of profunda femoral vein and great saphenous vein unremarkable. No filling defects to suggest DVT on grayscale or color Doppler imaging. Doppler waveforms show normal direction of venous flow, normal respiratory plasticity and response to augmentation. Limited views of the contralateral common femoral vein are unremarkable. OTHER None. Limitations: none IMPRESSION: Negative. Electronically Signed   By: Mark  Shogry M.D.   On: 05/18/2023 15:18   DG Foot Complete Left  Result Date: 05/18/2023 CLINICAL DATA:  Pain and swelling EXAM: LEFT FOOT - COMPLETE 3+ VIEW COMPARISON:  12/12/2016 FINDINGS: No recent displaced fracture or dislocation is seen. Erosive changes are noted in the tip of  distal phalanx of big toe. Distal portion of proximal phalanx of fourth toe and proximal portion of middle phalanx of the left fourth toe are missing. This may be residual from previous intervention. IMPRESSION: No recent fracture or dislocation is seen. Erosions are seen in the tip of distal phalanx of left big toe. If there is clinical suspicion for osteomyelitis, follow-up MRI may be considered. Electronically Signed   By: Palani  Rathinasamy M.D.   On: 05/18/2023 14:40    Review of Systems  Constitutional:  Negative for chills and fever.  HENT:  Negative for sinus pain and sore throat.   Respiratory:  Negative for cough and shortness of breath.   Cardiovascular:  Negative for chest pain and palpitations.  Gastrointestinal:  Negative for nausea and vomiting.  Genitourinary:  Negative for frequency and urgency.  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  Skin:        Patient relates recent sores on his left first, second, and third toes.  Has had some bleeding and drainage.  Some increased redness and swelling in the past few days.  Neurological:        Patient does relate neuropathy associated with his diabetes.  Psychiatric/Behavioral:  The patient is not nervous/anxious.    Blood pressure 104/61, pulse 65, temperature 97.6 F (36.4 C), temperature source Oral, resp. rate 16, weight 64.4 kg, SpO2 100 %. Physical Exam Cardiovascular:     Comments: DP and PT pulses are thready on the left foot.  Prior below-knee amputation on the right. 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  Some digital contractures on the left foot.  Reduced muscle mass and tone on the left leg.  Skin:    Comments: The skin is warm dry and atrophic with absent hair growth.  Some erythema and edema in the left first and third toes.  Ulcerations present at the distal aspect of both toes.  No significant drainage today.   Neurological:     Comments: Loss of protective threshold with a monofilament wire in the left foot.        Assessment/Plan: Assessment: 1.  Osteomyelitis left first and third toes. 2.  Diabetes with associated neuropathy. 3.  Peripheral vascular disease. 4.  Hammertoes left foot. 5.  Previous below-knee amputation right leg.  Plan: Discussed with the patient the need for amputation of his left first and third toes due to bone infection confirmed with MRI.  Patient understands the necessity of this needing to be performed and   decision made to proceed with surgery.  Patient is scheduled for angiogram tomorrow.  Most likely we will plan for Saturday for his amputations.  Discussed with the patient possible risks and complications of the procedures including inability of wound to heal due to his diabetes, his vascular status, or continued infection.  Discussed phantom pain.  Invited questions and answered.  Patient understands no guarantees can be given as to the outcome of surgery.  If nonhealing he may require higher amputation.  We will obtain consent for amputation of the left first and third toes.  Patient will be n.p.o. after midnight tomorrow.  Plan for surgery Saturday morning.  Kirill Chatterjee W Lilybelle Mayeda 05/19/2023, 12:55 PM      

## 2023-05-19 NOTE — Consult Note (Signed)
Reason for Consult: Osteomyelitis left first and third toes. Referring Physician: [name] is an 72 y.o. male.  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  Recently noticed some sores on the tips of his toes on the left foot and recently had some increased redness and swelling.  Presented to the emergency department for evaluation.  Bone infection confirmed with MRI in the first and third toes.  Past Medical History:  Diagnosis Date   Basal cell carcinoma 08/11/2022   R forearm - ED&C   Depression    Foot ulcer (HCC) 12/12/2016   Hyperlipidemia    Hypertension    PAD (peripheral artery disease) (HCC) ~2007   s/p R BKA for non-healing wound   Stroke (HCC) 03/2014   MRI: Acute nonhemorrhagic left paracentral pontine infarct. 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   TIA (transient ischemic attack) 01/2014    Past Surgical History:  Procedure Laterality Date   BELOW KNEE LEG AMPUTATION Right    FEMORAL-POPLITEAL BYPASS GRAFT Left 12/17/2016   Procedure: BYPASS LEFT FEMORAL TO BELOW POPLITEAL ARTERY USING PROPATEN GORE GRAFT;  Surgeon: Larina Earthly, MD;  Location: [facility] OR;  Service: Vascular;  Laterality: Left;   FEMORAL-POPLITEAL BYPASS GRAFT Left 09/30/2017   Procedure: LEFT LEG ANGIOGRAM,  THROMBECTOMY, FEM-POPLITEAL BYPASS GRAFT, tHROMBECTOMY PERONEAL ARTERY AND POSTERIOR TIBIAL , ENDARTERECTOMY TIBIAL/PERONEAL TRUNK WITH BOVINE PATCH ANGIOPLASTY.;  Surgeon: Fransisco Hertz, MD;  Location: [facility] OR;  Service: Vascular;  Laterality: Left;   INTRAMEDULLARY (IM) NAIL INTERTROCHANTERIC Right 03/21/2023   Procedure: INTRAMEDULLARY (IM) NAIL INTERTROCHANTERIC;  Surgeon: Deeann Saint, MD;  Location: [facility] ORS;  Service: Orthopedics;  Laterality: Right;   LOWER EXTREMITY  ANGIOGRAPHY Left 12/14/2022   Procedure: Lower Extremity Angiography;  Surgeon: Renford Dills, MD;  Location: [facility] INVASIVE CV LAB;  Service: Cardiovascular;  Laterality: Left;   PERIPHERAL VASCULAR CATHETERIZATION Left 12/14/2016   Procedure: Abdominal Aortogram w/Lower Extremity;  Surgeon: Chuck Hint, MD;  Location: [facility] INVASIVE CV LAB;  Service: Cardiovascular;  Laterality: Left;   right cataract extraction     TRANSTHORACIC ECHOCARDIOGRAM  01/2014   To evaluate possible CVA: EF 55-60%. GR 1 DD. No significant valvular lesions    Family History  Problem Relation Age of Onset   Hypertension Mother        Does not know history   Heart disease Mother    Stroke Mother    Diabetes Mother    Hypertension Father    Heart disease Father    Stroke Father    Diabetes Sister    Hypertension Sister    Heart disease Brother    Hypertension Brother     Social History:  reports that he has been smoking cigarettes. He has a 900.00 pack-year smoking history. He has never used smokeless tobacco. He reports that he does not drink alcohol and does not use drugs.  Allergies: No Known Allergies  Medications: Scheduled:  aspirin EC  81 mg Oral Daily   cyanocobalamin  1,000 mcg Oral Daily   folic acid  1 mg Oral Daily   insulin aspart  0-15 Units Subcutaneous TID WC   insulin aspart  0-5 Units Subcutaneous QHS   polyethylene glycol  17 g Oral Daily   rosuvastatin  40 mg Oral QHS    Results for orders placed  or performed during the hospital encounter of 05/18/23 (from the past 48 hour(s))  CBC with Differential     Status: Abnormal   Collection Time: 05/18/23 11:48 AM  Result Value Ref Range   WBC 11.4 (H) 4.0 - 10.5 K/uL   RBC 3.57 (L) 4.22 - 5.81 MIL/uL   Hemoglobin 10.6 (L) 13.0 - 17.0 g/dL   HCT 16.1 (L) 09.6 - 04.5 %   MCV 93.8 80.0 - 100.0 fL   MCH 29.7 26.0 - 34.0 pg   MCHC 31.6 30.0 - 36.0 g/dL   RDW 40.9 (H) 81.1 - 91.4 %   Platelets 274 150 - 400 K/uL   nRBC 0.0  0.0 - 0.2 %   Neutrophils Relative % 64 %   Neutro Abs 7.4 1.7 - 7.7 K/uL   Lymphocytes Relative 26 %   Lymphs Abs 3.0 0.7 - 4.0 K/uL   Monocytes Relative 6 %   Monocytes Absolute 0.6 0.1 - 1.0 K/uL   Eosinophils Relative 3 %   Eosinophils Absolute 0.3 0.0 - 0.5 K/uL   Basophils Relative 1 %   Basophils Absolute 0.1 0.0 - 0.1 K/uL   Immature Granulocytes 0 %   Abs Immature Granulocytes 0.04 0.00 - 0.07 K/uL    Comment: Performed at [facility], [address] [place], [place] [address]  Comprehensive metabolic panel     Status: Abnormal   Collection Time: 05/18/23 11:48 AM  Result Value Ref Range   Sodium 141 135 - 145 mmol/L   Potassium 4.8 3.5 - 5.1 mmol/L   Chloride 112 (H) 98 - 111 mmol/L   CO2 19 (L) 22 - 32 mmol/L   Glucose, Bld 122 (H) 70 - 99 mg/dL    Comment: Glucose reference range applies only to samples taken after fasting for at least 8 hours.   BUN 27 (H) 8 - 23 mg/dL   Creatinine, Ser 6.21 0.61 - 1.24 mg/dL   Calcium 8.0 (L) 8.9 - 10.3 mg/dL   Total Protein 7.6 6.5 - 8.1 g/dL   Albumin 3.8 3.5 - 5.0 g/dL   AST 19 15 - 41 U/L   ALT 11 0 - 44 U/L   Alkaline Phosphatase 68 38 - 126 U/L   Total Bilirubin 0.1 (L) 0.3 - 1.2 mg/dL   GFR, Estimated >30 >86 mL/min    Comment: (NOTE) Calculated using the CKD-EPI Creatinine Equation (2021)    Anion gap 10 5 - 15    Comment: Performed at [facility], [address] [place], [place] [address]  D-dimer, quantitative     Status: Abnormal   Collection Time: 05/18/23 11:48 AM  Result Value Ref Range   D-Dimer, Quant 1.53 (H) 0.00 - 0.50 ug/mL-FEU    Comment: (NOTE) At the manufacturer cut-off value of 0.5 g/mL FEU, this assay has a negative predictive value of 95-100%.This assay is intended for use in conjunction with a clinical pretest probability (PTP) assessment model to exclude pulmonary embolism (PE) and deep venous thrombosis (DVT) in outpatients suspected of PE or DVT. Results should be  correlated with clinical presentation. Performed at [facility], [address] [place], [place] [address]   APTT     Status: None   Collection Time: 05/18/23  3:21 PM  Result Value Ref Range   aPTT 36 24 - 36 seconds    Comment: Performed at [facility], [address] [place], [place] [address]  Protime-INR     Status: None   Collection  Time: 05/18/23  3:21 PM  Result Value Ref Range   Prothrombin Time 14.3 11.4 - 15.2 seconds   INR 1.1 0.8 - 1.2    Comment: (NOTE) INR goal varies based on device and disease states. Performed at [facility], [address] [place], [place] [address]   CBG monitoring, ED     Status: Abnormal   Collection Time: 05/18/23  4:59 PM  Result Value Ref Range   Glucose-Capillary 164 (H) 70 - 99 mg/dL    Comment: Glucose reference range applies only to samples taken after fasting for at least 8 hours.  Heparin level (unfractionated)     Status: None   Collection Time: 05/18/23 10:00 PM  Result Value Ref Range   Heparin Unfractionated 0.30 0.30 - 0.70 IU/mL    Comment: (NOTE) The clinical reportable range upper limit is being lowered to >1.10 to align with the FDA approved guidance for the current laboratory assay.  If heparin results are below expected values, and patient dosage has  been confirmed, suggest follow up testing of antithrombin III levels. Performed at [facility], [address] [place], [place] [address]   CBG monitoring, ED     Status: Abnormal   Collection Time: 05/18/23 11:46 PM  Result Value Ref Range   Glucose-Capillary 104 (H) 70 - 99 mg/dL    Comment: Glucose reference range applies only to samples taken after fasting for at least 8 hours.  Basic metabolic panel     Status: Abnormal   Collection Time: 05/19/23  6:06 AM  Result Value Ref Range   Sodium 139 135 - 145 mmol/L   Potassium 4.5 3.5 - 5.1 mmol/L   Chloride 111 98 - 111 mmol/L   CO2 20 (L) 22 - 32 mmol/L   Glucose,  Bld 128 (H) 70 - 99 mg/dL    Comment: Glucose reference range applies only to samples taken after fasting for at least 8 hours.   BUN 25 (H) 8 - 23 mg/dL   Creatinine, Ser 0.98 0.61 - 1.24 mg/dL   Calcium 9.1 8.9 - 11.9 mg/dL   GFR, Estimated >14 >78 mL/min    Comment: (NOTE) Calculated using the CKD-EPI Creatinine Equation (2021)    Anion gap 8 5 - 15    Comment: Performed at [facility], [address] [place], [place] [address]  CBC     Status: Abnormal   Collection Time: 05/19/23  6:06 AM  Result Value Ref Range   WBC 10.0 4.0 - 10.5 K/uL   RBC 3.26 (L) 4.22 - 5.81 MIL/uL   Hemoglobin 9.6 (L) 13.0 - 17.0 g/dL   HCT 13.0 (L) 86.5 - 78.4 %   MCV 93.3 80.0 - 100.0 fL   MCH 29.4 26.0 - 34.0 pg   MCHC 31.6 30.0 - 36.0 g/dL   RDW 69.6 (H) 29.5 - 28.4 %   Platelets 237 150 - 400 K/uL   nRBC 0.0 0.0 - 0.2 %    Comment: Performed at [facility], [address] [place], [place] [address]  Heparin level (unfractionated)     Status: Abnormal   Collection Time: 05/19/23  6:06 AM  Result Value Ref Range   Heparin Unfractionated 0.21 (L) 0.30 - 0.70 IU/mL    Comment: (NOTE) The clinical reportable range upper limit is being lowered to >1.10 to align with the FDA approved guidance for the current laboratory assay.  If heparin results are below expected values, and patient dosage has  been confirmed, suggest follow up testing of antithrombin III levels. Performed at [facility], [address] [place], [place] [address]   Iron and TIBC     Status: Abnormal   Collection Time: 05/19/23  6:06 AM  Result Value Ref Range   Iron 55 45 - 182 ug/dL   TIBC 914 (L) 782 - 956 ug/dL   Saturation Ratios 24 17.9 - 39.5 %   UIBC 173 ug/dL    Comment: Performed at [facility], [address] [place], [place] [address]  Ferritin     Status: None   Collection Time: 05/19/23  6:06 AM  Result Value Ref Range   Ferritin 205 24 - 336 ng/mL    Comment:  Performed at [facility], [address] [place], [place] [address]  CBG monitoring, ED     Status: Abnormal   Collection Time: 05/19/23  7:46 AM  Result Value Ref Range   Glucose-Capillary 112 (H) 70 - 99 mg/dL    Comment: Glucose reference range applies only to samples taken after fasting for at least 8 hours.   Comment 1 Notify RN    Comment 2 Document in Chart   CBG monitoring, ED     Status: Abnormal   Collection Time: 05/19/23 11:30 AM  Result Value Ref Range   Glucose-Capillary 116 (H) 70 - 99 mg/dL    Comment: Glucose reference range applies only to samples taken after fasting for at least 8 hours.    MR FOOT LEFT W WO CONTRAST  Result Date: 05/19/2023 CLINICAL DATA:  Soft tissue infection suspected, foot, xray done osteomyelitis suspected EXAM: MRI OF THE LEFT FOREFOOT WITHOUT AND WITH CONTRAST TECHNIQUE: Multiplanar, multisequence MR imaging of the left forefoot was performed both before and after administration of intravenous contrast. CONTRAST:  6mL GADAVIST GADOBUTROL 1 MMOL/ML IV SOLN COMPARISON:  05/18/2023 FINDINGS: Bones/Joint/Cartilage Bone marrow edema and enhancement throughout the distal phalanx of the left great toe. Confluent low T1 marrow signal changes within the tuft of the great toe distal phalanx compatible with acute osteomyelitis. Trace joint fluid within the great toe IP joint, nonspecific. Preserved marrow signal within the proximal phalanx of the great toe. There is also bone marrow edema and enhancement within the distal phalanx of the third toe with intermediate-low T1 marrow signal. Preserved signal within the proximal middle phalanx of third toe. Remaining osseous structures of the forefoot demonstrate normal bone marrow signal. No additional sites of osteomyelitis. No acute fracture or dislocation. Hammertoe deformities of the second through fourth toes. Ligaments Intact Lisfranc ligament.  Intact collateral ligaments. Muscles and Tendons Chronic  denervation changes of the foot musculature. Intact flexor and extensor tendons. No significant tenosynovial fluid collection. Soft tissues Small ulceration at the distal aspect of the great toe with mild soft tissue edema and enhancement. No organized or drainable fluid collections. IMPRESSION: 1. Small ulceration at the distal aspect of the great toe with acute osteomyelitis of the great toe distal phalanx. 2. Acute osteomyelitis of the distal phalanx of the third toe. 3. Trace joint fluid within the great toe IP joint, nonspecific and may be reactive. Septic arthritis not excluded. Electronically Signed   By: Duanne Guess D.O.   On: 05/19/2023 08:11   US Venous Img Lower Unilateral Left (DVT)  Result Date: 05/18/2023 CLINICAL DATA:  Left leg pain and swelling EXAM: Left LOWER EXTREMITY VENOUS DOPPLER ULTRASOUND TECHNIQUE: Gray-scale sonography with compression, as well as color and duplex ultrasound, were performed to evaluate  the deep venous system(s) from the level of the common femoral vein through the popliteal and proximal calf veins. COMPARISON:  12/14/2022 FINDINGS: VENOUS Normal compressibility of the common femoral, superficial femoral, and popliteal veins, as well as the visualized calf veins. Visualized portions of profunda femoral vein and great saphenous vein unremarkable. No filling defects to suggest DVT on grayscale or color Doppler imaging. Doppler waveforms show normal direction of venous flow, normal respiratory plasticity and response to augmentation. Limited views of the contralateral common femoral vein are unremarkable. OTHER None. Limitations: none IMPRESSION: Negative. Electronically Signed   By: Paulina Fusi M.D.   On: 05/18/2023 15:18   DG Foot Complete Left  Result Date: 05/18/2023 CLINICAL DATA:  Pain and swelling EXAM: LEFT FOOT - COMPLETE 3+ VIEW COMPARISON:  12/12/2016 FINDINGS: No recent displaced fracture or dislocation is seen. Erosive changes are noted in the tip of  distal phalanx of big toe. Distal portion of proximal phalanx of fourth toe and proximal portion of middle phalanx of the left fourth toe are missing. This may be residual from previous intervention. IMPRESSION: No recent fracture or dislocation is seen. Erosions are seen in the tip of distal phalanx of left big toe. If there is clinical suspicion for osteomyelitis, follow-up MRI may be considered. Electronically Signed   By: Ernie Avena M.D.   On: 05/18/2023 14:40    Review of Systems  Constitutional:  Negative for chills and fever.  HENT:  Negative for sinus pain and sore throat.   Respiratory:  Negative for cough and shortness of breath.   Cardiovascular:  Negative for chest pain and palpitations.  Gastrointestinal:  Negative for nausea and vomiting.  Genitourinary:  Negative for frequency and urgency.  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  Skin:        Patient relates recent sores on his left first, second, and third toes.  Has had some bleeding and drainage.  Some increased redness and swelling in the past few days.  Neurological:        Patient does relate neuropathy associated with his diabetes.  Psychiatric/Behavioral:  The patient is not nervous/anxious.    Blood pressure 104/61, pulse 65, temperature 97.6 F (36.4 C), temperature source Oral, resp. rate 16, weight 64.4 kg, SpO2 100 %. Physical Exam Cardiovascular:     Comments: DP and PT pulses are thready on the left foot.  Prior below-knee amputation on the right. 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  Some digital contractures on the left foot.  Reduced muscle mass and tone on the left leg.  Skin:    Comments: The skin is warm dry and atrophic with absent hair growth.  Some erythema and edema in the left first and third toes.  Ulcerations present at the distal aspect of both toes.  No significant drainage today.   Neurological:     Comments: Loss of protective threshold with a monofilament wire in the left foot.        Assessment/Plan: Assessment: 1.  Osteomyelitis left first and third toes. 2.  Diabetes with associated neuropathy. 3.  Peripheral vascular disease. 4.  Hammertoes left foot. 5.  Previous below-knee amputation right leg.  Plan: Discussed with the patient the need for amputation of his left first and third toes due to bone infection confirmed with MRI.  Patient understands the necessity of this needing to be performed and  decision made to proceed with surgery.  Patient is scheduled for angiogram tomorrow.  Most likely we will plan for Saturday for his amputations.  Discussed with the patient possible risks and complications of the procedures including inability of wound to heal due to his diabetes, his vascular status, or continued infection.  Discussed phantom pain.  Invited questions and answered.  Patient understands no guarantees can be given as to the outcome of surgery.  If nonhealing he may require higher amputation.  We will obtain consent for amputation of the left first and third toes.  Patient will be n.p.o. after midnight tomorrow.  Plan for surgery Saturday morning.  Ricci Barker 05/19/2023, 12:55 PM

## 2023-05-19 NOTE — ED Notes (Signed)
ED TO INPATIENT HANDOFF REPORT  ED Nurse Name and Phone #: Gearlean Alf Name/Age/Gender Jordan Arellano 72 y.o. male Room/Bed: ED33A/ED33A  Code Status   Code Status: Full Code  Home/SNF/Other Home Patient oriented to: self, place, time, and situation Is this baseline? 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 Pt states he noticed he was having L foot pain about 2 days ago. Pt recently had a stent placed due to "poor circulation" in L leg. Pt l toes are inflamed and red. L pedal pulse present. 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 [facility]) [[id number]]  Admitting Physician: Lovenia Kim [[id number]]  Attending Physician: COX, AMY N [id number]  Certification:: I certify this patient will need inpatient services for at least 2 midnights  Estimated Length of Stay: 3          B Medical/Surgery History Past Medical History:  Diagnosis Date   Basal cell carcinoma 08/11/2022   R forearm - ED&C   Depression    Foot ulcer (HCC) 12/12/2016   Hyperlipidemia    Hypertension    PAD (peripheral artery disease) (HCC) ~2007   s/p R BKA for non-healing wound   Stroke (HCC) 03/2014   MRI: Acute nonhemorrhagic left paracentral pontine infarct. 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   TIA (transient ischemic attack) 01/2014   Past Surgical History:  Procedure Laterality Date   BELOW  KNEE LEG AMPUTATION Right    FEMORAL-POPLITEAL BYPASS GRAFT Left 12/17/2016   Procedure: BYPASS LEFT FEMORAL TO BELOW POPLITEAL ARTERY USING PROPATEN GORE GRAFT;  Surgeon: Larina Earthly, MD;  Location: [facility] OR;  Service: Vascular;  Laterality: Left;   FEMORAL-POPLITEAL BYPASS GRAFT Left 09/30/2017   Procedure: LEFT LEG ANGIOGRAM,  THROMBECTOMY, FEM-POPLITEAL BYPASS GRAFT, tHROMBECTOMY PERONEAL ARTERY AND POSTERIOR TIBIAL , ENDARTERECTOMY TIBIAL/PERONEAL TRUNK WITH BOVINE PATCH ANGIOPLASTY.;  Surgeon: Fransisco Hertz, MD;  Location: [facility] OR;  Service: Vascular;  Laterality: Left;   INTRAMEDULLARY (IM) NAIL INTERTROCHANTERIC Right 03/21/2023   Procedure: INTRAMEDULLARY (IM) NAIL INTERTROCHANTERIC;  Surgeon: Deeann Saint, MD;  Location: [facility] ORS;  Service: Orthopedics;  Laterality: Right;   LOWER EXTREMITY ANGIOGRAPHY Left 12/14/2022   Procedure: Lower Extremity Angiography;  Surgeon: Renford Dills, MD;  Location: [facility] INVASIVE CV LAB;  Service: Cardiovascular;  Laterality: Left;   PERIPHERAL VASCULAR CATHETERIZATION Left 12/14/2016   Procedure: Abdominal Aortogram w/Lower Extremity;  Surgeon: Chuck Hint, MD;  Location: [facility] INVASIVE CV LAB;  Service: Cardiovascular;  Laterality: Left;   right cataract extraction     TRANSTHORACIC ECHOCARDIOGRAM  01/2014   To evaluate possible CVA: EF 55-60%. GR 1 DD. No significant valvular lesions     A IV Location/Drains/Wounds Patient Lines/Drains/Airways Status     Active Line/Drains/Airways     Name Placement date Placement time Site Days   Peripheral IV 05/18/23 20 G Right Antecubital 05/18/23  1354  Antecubital  1   Wound / Incision (Open or Dehisced) 12/16/16 Venous stasis ulcer Toe (Comment  which one) Left 3rd toe 12/16/16  0840  Toe (Comment  which one)  2345            Intake/Output Last 24 hours  Intake/Output Summary (Last 24 hours) at 05/19/2023 2050 Last data filed at 05/19/2023 2035 Gross per 24 hour  Intake 468.78 ml  Output  920 ml  Net -451.22 ml    Labs/Imaging Results for orders placed or performed during the hospital encounter of 05/18/23 (from the past 48 hour(s))  CBC with Differential     Status: Abnormal   Collection Time: 05/18/23 11:48 AM  Result Value Ref Range   WBC 11.4 (H) 4.0 - 10.5 K/uL   RBC 3.57 (L) 4.22 - 5.81 MIL/uL   Hemoglobin 10.6 (L) 13.0 - 17.0 g/dL   HCT 78.2 (L) 95.6 - 21.3 %   MCV 93.8 80.0 - 100.0 fL   MCH 29.7 26.0 - 34.0 pg   MCHC 31.6 30.0 - 36.0 g/dL   RDW 08.6 (H) 57.8 - 46.9 %   Platelets 274 150 - 400 K/uL   nRBC 0.0 0.0 - 0.2 %   Neutrophils Relative % 64 %   Neutro Abs 7.4 1.7 - 7.7 K/uL   Lymphocytes Relative 26 %   Lymphs Abs 3.0 0.7 - 4.0 K/uL   Monocytes Relative 6 %   Monocytes Absolute 0.6 0.1 - 1.0 K/uL   Eosinophils Relative 3 %   Eosinophils Absolute 0.3 0.0 - 0.5 K/uL   Basophils Relative 1 %   Basophils Absolute 0.1 0.0 - 0.1 K/uL   Immature Granulocytes 0 %   Abs Immature Granulocytes 0.04 0.00 - 0.07 K/uL    Comment: Performed at [facility], [address] [place], [place] [address]  Comprehensive metabolic panel     Status: Abnormal   Collection Time: 05/18/23 11:48 AM  Result Value Ref Range   Sodium 141 135 - 145 mmol/L   Potassium 4.8 3.5 - 5.1 mmol/L   Chloride 112 (H) 98 - 111 mmol/L   CO2 19 (L) 22 - 32 mmol/L   Glucose, Bld 122 (H) 70 - 99 mg/dL    Comment: Glucose reference range applies only to samples taken after fasting for at least 8 hours.   BUN 27 (H) 8 - 23 mg/dL   Creatinine, Ser 8.41 0.61 - 1.24 mg/dL   Calcium 8.0 (L) 8.9 - 10.3 mg/dL   Total Protein 7.6 6.5 - 8.1 g/dL   Albumin 3.8 3.5 - 5.0 g/dL   AST 19 15 - 41 U/L   ALT 11 0 - 44 U/L   Alkaline Phosphatase 68 38 - 126 U/L   Total Bilirubin 0.1 (L) 0.3 - 1.2 mg/dL   GFR, Estimated >32 >44 mL/min    Comment: (NOTE) Calculated using the CKD-EPI Creatinine Equation (2021)    Anion gap 10 5 - 15    Comment: Performed at [facility], [address] [place], [place] [address]  D-dimer, quantitative     Status: Abnormal   Collection Time: 05/18/23 11:48 AM  Result Value Ref Range   D-Dimer, Quant 1.53 (H) 0.00 - 0.50 ug/mL-FEU    Comment: (NOTE) At the manufacturer cut-off value of 0.5 g/mL FEU, this assay has a negative predictive value of 95-100%.This assay is intended for use in conjunction with a clinical pretest probability (PTP) assessment model to exclude pulmonary embolism (PE)  and deep venous thrombosis (DVT) in outpatients suspected of PE or DVT. Results should be correlated with clinical presentation. Performed at [facility], [address] [place], [place] [address]   APTT     Status: None   Collection Time: 05/18/23  3:21 PM  Result Value Ref Range   aPTT 36 24 - 36 seconds    Comment: Performed at [facility], [address] [place], [place] [address]  Protime-INR     Status: None   Collection Time: 05/18/23  3:21 PM  Result Value Ref Range   Prothrombin Time 14.3 11.4 - 15.2 seconds   INR 1.1 0.8 - 1.2    Comment: (NOTE) INR goal varies based on device and disease states. Performed at [facility], [address] [place], [place] [address]   CBG monitoring, ED     Status: Abnormal   Collection Time: 05/18/23  4:59 PM  Result Value Ref Range   Glucose-Capillary 164 (H) 70 - 99 mg/dL    Comment: Glucose reference range applies only to samples taken after fasting for at least 8 hours.  Heparin level (unfractionated)     Status: None   Collection Time: 05/18/23 10:00 PM  Result Value Ref Range   Heparin Unfractionated 0.30 0.30 - 0.70 IU/mL    Comment: (NOTE) The clinical reportable range upper limit is being lowered to >1.10 to align with the FDA approved guidance for the current laboratory assay.  If heparin results are below expected values, and patient dosage has  been confirmed, suggest follow up testing of antithrombin III levels. Performed at [facility], [address] [place], [place] [address]   CBG monitoring, ED     Status: Abnormal   Collection Time: 05/18/23 11:46 PM  Result Value Ref Range   Glucose-Capillary 104 (H) 70 - 99 mg/dL    Comment: Glucose reference range applies only to samples taken after fasting for at least 8 hours.  Basic metabolic panel     Status: Abnormal   Collection Time: 05/19/23  6:06 AM  Result Value Ref Range   Sodium 139 135 - 145 mmol/L   Potassium 4.5 3.5 - 5.1 mmol/L   Chloride 111 98 - 111 mmol/L   CO2 20 (L) 22 - 32 mmol/L   Glucose, Bld 128 (H) 70 - 99 mg/dL    Comment: Glucose reference range applies only to samples taken after fasting for at least 8 hours.   BUN 25 (H) 8 - 23 mg/dL   Creatinine, Ser 2.95 0.61 - 1.24 mg/dL   Calcium 9.1 8.9 - 62.1 mg/dL   GFR, Estimated >30 >86 mL/min    Comment: (NOTE) Calculated using the CKD-EPI Creatinine Equation (2021)    Anion gap 8 5 - 15    Comment: Performed at [facility], [address] [place], [place] [address]  CBC     Status: Abnormal   Collection Time: 05/19/23  6:06 AM  Result Value Ref Range   WBC 10.0 4.0 - 10.5 K/uL   RBC 3.26 (L) 4.22 - 5.81 MIL/uL   Hemoglobin 9.6 (L) 13.0 - 17.0 g/dL   HCT 96.2 (L) 95.2 - 84.1 %   MCV 93.3 80.0 - 100.0 fL   MCH 29.4 26.0 - 34.0 pg   MCHC 31.6 30.0 - 36.0 g/dL   RDW 32.4 (H) 40.1 - 02.7 %   Platelets 237 150 - 400 K/uL   nRBC 0.0 0.0 - 0.2 %  Comment: Performed at [facility], [address] [place], [place] [address]  Heparin level (unfractionated)     Status: Abnormal   Collection Time: 05/19/23  6:06 AM  Result Value Ref Range   Heparin Unfractionated 0.21 (L) 0.30 - 0.70 IU/mL    Comment: (NOTE) The clinical reportable range upper limit is being lowered to >1.10 to align with the FDA approved guidance for the current laboratory assay.  If heparin results are below expected values, and patient dosage has  been confirmed, suggest follow up  testing of antithrombin III levels. Performed at [facility], [address] [place], [place] [address]   Iron and TIBC     Status: Abnormal   Collection Time: 05/19/23  6:06 AM  Result Value Ref Range   Iron 55 45 - 182 ug/dL   TIBC 846 (L) 962 - 952 ug/dL   Saturation Ratios 24 17.9 - 39.5 %   UIBC 173 ug/dL    Comment: Performed at [facility], [address] [place], [place] [address]  Ferritin     Status: None   Collection Time: 05/19/23  6:06 AM  Result Value Ref Range   Ferritin 205 24 - 336 ng/mL    Comment: Performed at [facility], [address] [place], [place] [address]  CBG monitoring, ED     Status: Abnormal   Collection Time: 05/19/23  7:46 AM  Result Value Ref Range   Glucose-Capillary 112 (H) 70 - 99 mg/dL    Comment: Glucose reference range applies only to samples taken after fasting for at least 8 hours.   Comment 1 Notify RN    Comment 2 Document in Chart   CBG monitoring, ED     Status: Abnormal   Collection Time: 05/19/23 11:30 AM  Result Value Ref Range   Glucose-Capillary 116 (H) 70 - 99 mg/dL    Comment: Glucose reference range applies only to samples taken after fasting for at least 8 hours.  Heparin level (unfractionated)     Status: None   Collection Time: 05/19/23  4:48 PM  Result Value Ref Range   Heparin Unfractionated 0.37 0.30 - 0.70 IU/mL    Comment: (NOTE) The clinical reportable range upper limit is being lowered to >1.10 to align with the FDA approved guidance for the current laboratory assay.  If heparin results are below expected values, and patient dosage has  been confirmed, suggest follow up testing of antithrombin III levels. Performed at [facility], [address] [place], [place] [address]    MR FOOT LEFT W WO CONTRAST  Result Date: 05/19/2023 CLINICAL DATA:  Soft tissue infection suspected, foot, xray done osteomyelitis suspected EXAM: MRI OF THE LEFT FOREFOOT WITHOUT AND WITH  CONTRAST TECHNIQUE: Multiplanar, multisequence MR imaging of the left forefoot was performed both before and after administration of intravenous contrast. CONTRAST:  6mL GADAVIST GADOBUTROL 1 MMOL/ML IV SOLN COMPARISON:  05/18/2023 FINDINGS: Bones/Joint/Cartilage Bone marrow edema and enhancement throughout the distal phalanx of the left great toe. Confluent low T1 marrow signal changes within the tuft of the great toe distal phalanx compatible with acute osteomyelitis. Trace joint fluid within the great toe IP joint, nonspecific. Preserved marrow signal within the proximal phalanx of the great toe. There is also bone marrow edema and enhancement within the distal phalanx of the third toe with intermediate-low T1 marrow signal. Preserved signal within the proximal middle phalanx of third toe. Remaining osseous structures of the forefoot demonstrate normal bone  marrow signal. No additional sites of osteomyelitis. No acute fracture or dislocation. Hammertoe deformities of the second through fourth toes. Ligaments Intact Lisfranc ligament.  Intact collateral ligaments. Muscles and Tendons Chronic denervation changes of the foot musculature. Intact flexor and extensor tendons. No significant tenosynovial fluid collection. Soft tissues Small ulceration at the distal aspect of the great toe with mild soft tissue edema and enhancement. No organized or drainable fluid collections. IMPRESSION: 1. Small ulceration at the distal aspect of the great toe with acute osteomyelitis of the great toe distal phalanx. 2. Acute osteomyelitis of the distal phalanx of the third toe. 3. Trace joint fluid within the great toe IP joint, nonspecific and may be reactive. Septic arthritis not excluded. Electronically Signed   By: Duanne Guess D.O.   On: 05/19/2023 08:11   US Venous Img Lower Unilateral Left (DVT)  Result Date: 05/18/2023 CLINICAL DATA:  Left leg pain and swelling EXAM: Left LOWER EXTREMITY VENOUS DOPPLER ULTRASOUND  TECHNIQUE: Gray-scale sonography with compression, as well as color and duplex ultrasound, were performed to evaluate the deep venous system(s) from the level of the common femoral vein through the popliteal and proximal calf veins. COMPARISON:  12/14/2022 FINDINGS: VENOUS Normal compressibility of the common femoral, superficial femoral, and popliteal veins, as well as the visualized calf veins. Visualized portions of profunda femoral vein and great saphenous vein unremarkable. No filling defects to suggest DVT on grayscale or color Doppler imaging. Doppler waveforms show normal direction of venous flow, normal respiratory plasticity and response to augmentation. Limited views of the contralateral common femoral vein are unremarkable. OTHER None. Limitations: none IMPRESSION: Negative. Electronically Signed   By: Paulina Fusi M.D.   On: 05/18/2023 15:18   DG Foot Complete Left  Result Date: 05/18/2023 CLINICAL DATA:  Pain and swelling EXAM: LEFT FOOT - COMPLETE 3+ VIEW COMPARISON:  12/12/2016 FINDINGS: No recent displaced fracture or dislocation is seen. Erosive changes are noted in the tip of distal phalanx of big toe. Distal portion of proximal phalanx of fourth toe and proximal portion of middle phalanx of the left fourth toe are missing. This may be residual from previous intervention. IMPRESSION: No recent fracture or dislocation is seen. Erosions are seen in the tip of distal phalanx of left big toe. If there is clinical suspicion for osteomyelitis, follow-up MRI may be considered. Electronically Signed   By: Ernie Avena M.D.   On: 05/18/2023 14:40    Pending Labs Unresulted Labs (From admission, onward)     Start     Ordered   05/20/23 0500  CBC  Tomorrow morning,   R        05/19/23 0735   05/20/23 0500  Basic metabolic panel  Tomorrow morning,   R        05/19/23 1256   05/20/23 0500  Magnesium  Tomorrow morning,   R        05/19/23 1257   05/19/23 2300  Heparin level  (unfractionated)  Once-Timed,   TIMED        05/19/23 1800   05/19/23 0825  Vitamin B12  Once,   R       Comments: THIS TEST CAN'T BE ADDED PLEASE COLLECT SST THANK YOU    05/19/23 0825            Vitals/Pain Today's Vitals   05/19/23 1600 05/19/23 1630 05/19/23 1700 05/19/23 2000  BP: (!) 100/56 (!) 120/98 119/68 116/65  Pulse: 63 76 69 70  Resp: 19 (!)  26 16 15   Temp:   98.1 F (36.7 C) (P) 98.1 F (36.7 C)  TempSrc:   Oral (P) Oral  SpO2: 96% 96% 98% 97%  Weight:      PainSc:   0-No pain (P) 1     Isolation Precautions No active isolations  Medications Medications  heparin bolus via infusion 4,500 Units (4,500 Units Intravenous Bolus from Bag 05/18/23 1513)    Followed by  heparin ADULT infusion 100 units/mL (25000 units/243mL) (1,200 Units/hr Intravenous Rate/Dose Verify 05/19/23 2000)  acetaminophen (TYLENOL) tablet 650 mg (has no administration in time range)    Or  acetaminophen (TYLENOL) suppository 650 mg (has no administration in time range)  ondansetron (ZOFRAN) tablet 4 mg (has no administration in time range)    Or  ondansetron (ZOFRAN) injection 4 mg (has no administration in time range)  senna-docusate (Senokot-S) tablet 1 tablet (has no administration in time range)  insulin aspart (novoLOG) injection 0-15 Units ( Subcutaneous Not Given 05/19/23 1723)  insulin aspart (novoLOG) injection 0-5 Units ( Subcutaneous Not Given 05/18/23 2349)  nicotine (NICODERM CQ - dosed in mg/24 hours) patch 14 mg (has no administration in time range)  aspirin EC tablet 81 mg (81 mg Oral Given 05/19/23 0842)  rosuvastatin (CRESTOR) tablet 40 mg (40 mg Oral Given 05/19/23 0036)  folic acid (FOLVITE) tablet 1 mg (1 mg Oral Given 05/19/23 0842)  cyanocobalamin (VITAMIN B12) tablet 1,000 mcg (1,000 mcg Oral Given 05/19/23 0842)  polyethylene glycol (MIRALAX / GLYCOLAX) packet 17 g (17 g Oral Not Given 05/19/23 0843)  bisacodyl (DULCOLAX) EC tablet 10 mg (has no administration in time  range)  HYDROcodone-acetaminophen (NORCO/VICODIN) 5-325 MG per tablet 1 tablet (has no administration in time range)  morphine (PF) 4 MG/ML injection 4 mg (has no administration in time range)  hydrALAZINE (APRESOLINE) injection 5 mg (has no administration in time range)  ceFEPIme (MAXIPIME) 2 g in sodium chloride 0.9 % 100 mL IVPB (0 g Intravenous Stopped 05/19/23 2035)  vancomycin (VANCOREADY) IVPB 1250 mg/250 mL (1,250 mg Intravenous New Bag/Given 05/19/23 2049)  sertraline (ZOLOFT) tablet 100 mg (100 mg Oral Given 05/19/23 1455)  cefTRIAXone (ROCEPHIN) 2 g in sodium chloride 0.9 % 100 mL IVPB (0 g Intravenous Stopped 05/18/23 1438)  vancomycin (VANCOREADY) IVPB 1500 mg/300 mL (0 mg Intravenous Stopped 05/18/23 2120)  gadobutrol (GADAVIST) 1 MMOL/ML injection 6 mL (6 mLs Intravenous Contrast Given 05/18/23 2327)  heparin bolus via infusion 950 Units (950 Units Intravenous Bolus from Bag 05/19/23 0829)    Mobility walks with device     Focused Assessments Pt has right aka and ambulates with prosthesis and cane or walker. Pt has sores on left foot, no pain, no drainage, no dressing in place just hospital sock   R Recommendations: See Admitting Provider Note  Report given to:   Additional Notes:  Pt has hearing aids, they are in specimen cups and labeled with pt name, rest of belongings including prosthetic leg at the bedside.

## 2023-05-20 ENCOUNTER — Encounter
Admission: EM | Disposition: A | Discharge status: Skilled Nursing Facility | Payer: Self-pay | Source: Home / Self Care | Attending: Internal Medicine

## 2023-05-20 DIAGNOSIS — M86072 Acute hematogenous osteomyelitis, left ankle and foot: Secondary | ICD-10-CM | POA: Diagnosis not present

## 2023-05-20 DIAGNOSIS — I70245 Atherosclerosis of native arteries of left leg with ulceration of other part of foot: Secondary | ICD-10-CM

## 2023-05-20 DIAGNOSIS — I77819 Aortic ectasia, unspecified site: Secondary | ICD-10-CM

## 2023-05-20 DIAGNOSIS — L97509 Non-pressure chronic ulcer of other part of unspecified foot with unspecified severity: Secondary | ICD-10-CM | POA: Diagnosis not present

## 2023-05-20 DIAGNOSIS — I708 Atherosclerosis of other arteries: Secondary | ICD-10-CM

## 2023-05-20 DIAGNOSIS — I7025 Atherosclerosis of native arteries of other extremities with ulceration: Secondary | ICD-10-CM | POA: Diagnosis not present

## 2023-05-20 DIAGNOSIS — I70201 Unspecified atherosclerosis of native arteries of extremities, right leg: Secondary | ICD-10-CM

## 2023-05-20 DIAGNOSIS — L089 Local infection of the skin and subcutaneous tissue, unspecified: Secondary | ICD-10-CM

## 2023-05-20 DIAGNOSIS — Z95828 Presence of other vascular implants and grafts: Secondary | ICD-10-CM

## 2023-05-20 HISTORY — PX: LOWER EXTREMITY ANGIOGRAPHY: CATH118251

## 2023-05-20 HISTORY — PX: LOWER EXTREMITY INTERVENTION: CATH118252

## 2023-05-20 LAB — CBC
HCT: 31.3 % — ABNORMAL LOW (ref 39.0–52.0)
Hemoglobin: 10 g/dL — ABNORMAL LOW (ref 13.0–17.0)
MCH: 29.7 pg (ref 26.0–34.0)
MCHC: 31.9 g/dL (ref 30.0–36.0)
MCV: 92.9 fL (ref 80.0–100.0)
Platelets: 231 10*3/uL (ref 150–400)
RBC: 3.37 MIL/uL — ABNORMAL LOW (ref 4.22–5.81)
RDW: 16.2 % — ABNORMAL HIGH (ref 11.5–15.5)
WBC: 8.8 10*3/uL (ref 4.0–10.5)
nRBC: 0 % (ref 0.0–0.2)

## 2023-05-20 LAB — BASIC METABOLIC PANEL
Anion gap: 9 (ref 5–15)
BUN: 25 mg/dL — ABNORMAL HIGH (ref 8–23)
CO2: 20 mmol/L — ABNORMAL LOW (ref 22–32)
Calcium: 8.9 mg/dL (ref 8.9–10.3)
Chloride: 111 mmol/L (ref 98–111)
Creatinine, Ser: 1.12 mg/dL (ref 0.61–1.24)
GFR, Estimated: >60 mL/min (ref >60)
Glucose, Bld: 118 mg/dL — ABNORMAL HIGH (ref 70–99)
Potassium: 4.3 mmol/L (ref 3.5–5.1)
Sodium: 140 mmol/L (ref 135–145)

## 2023-05-20 LAB — GLUCOSE, CAPILLARY
Glucose-Capillary: 109 mg/dL — ABNORMAL HIGH (ref 70–99)
Glucose-Capillary: 126 mg/dL — ABNORMAL HIGH (ref 70–99)
Glucose-Capillary: 125 mg/dL — ABNORMAL HIGH (ref 70–99)
Glucose-Capillary: 108 mg/dL — ABNORMAL HIGH (ref 70–99)
Glucose-Capillary: 102 mg/dL — ABNORMAL HIGH (ref 70–99)
Glucose-Capillary: 107 mg/dL — ABNORMAL HIGH (ref 70–99)

## 2023-05-20 LAB — CBC WITH DIFFERENTIAL/PLATELET
Abs Immature Granulocytes: 0.03 10*3/uL (ref 0.00–0.07)
Basophils Absolute: 0 10*3/uL (ref 0.0–0.1)
Basophils Relative: 0 %
Eosinophils Absolute: 0.1 10*3/uL (ref 0.0–0.5)
Eosinophils Relative: 1 %
HCT: 31.7 % — ABNORMAL LOW (ref 39.0–52.0)
Hemoglobin: 10.1 g/dL — ABNORMAL LOW (ref 13.0–17.0)
Immature Granulocytes: 0 %
Lymphocytes Relative: 3 %
Lymphs Abs: 0.3 10*3/uL — ABNORMAL LOW (ref 0.7–4.0)
MCH: 29.9 pg (ref 26.0–34.0)
MCHC: 31.9 g/dL (ref 30.0–36.0)
MCV: 93.8 fL (ref 80.0–100.0)
Monocytes Absolute: 0.1 10*3/uL (ref 0.1–1.0)
Monocytes Relative: 1 %
Neutro Abs: 8.4 10*3/uL — ABNORMAL HIGH (ref 1.7–7.7)
Neutrophils Relative %: 95 %
Platelets: 218 10*3/uL (ref 150–400)
RBC: 3.38 MIL/uL — ABNORMAL LOW (ref 4.22–5.81)
RDW: 16.4 % — ABNORMAL HIGH (ref 11.5–15.5)
WBC: 8.9 10*3/uL (ref 4.0–10.5)
nRBC: 0 % (ref 0.0–0.2)

## 2023-05-20 LAB — LACTIC ACID, PLASMA: Lactic Acid, Venous: 2.2 mmol/L (ref 0.5–1.9)

## 2023-05-20 LAB — HEPARIN LEVEL (UNFRACTIONATED)
Heparin Unfractionated: 0.26 IU/mL — ABNORMAL LOW (ref 0.30–0.70)
Heparin Unfractionated: 0.48 IU/mL (ref 0.30–0.70)

## 2023-05-20 LAB — MAGNESIUM: Magnesium: 2 mg/dL (ref 1.7–2.4)

## 2023-05-20 SURGERY — LOWER EXTREMITY ANGIOGRAPHY
Anesthesia: Moderate Sedation | Laterality: Left

## 2023-05-20 MED ORDER — LACTATED RINGERS IV BOLUS
500.0000 mL | Freq: Once | INTRAVENOUS | Status: AC
  Administered 2023-05-20: 500 mL via INTRAVENOUS

## 2023-05-20 MED ORDER — HEPARIN SODIUM (PORCINE) 1000 UNIT/ML IJ SOLN
INTRAMUSCULAR | Status: AC
  Filled 2023-05-20: qty 10

## 2023-05-20 MED ORDER — CEFAZOLIN SODIUM-DEXTROSE 2-4 GM/100ML-% IV SOLN
INTRAVENOUS | Status: AC
  Filled 2023-05-20: qty 100

## 2023-05-20 MED ORDER — LACTATED RINGERS IV BOLUS
890.0000 mL | Freq: Once | INTRAVENOUS | Status: AC
  Administered 2023-05-20: 890 mL via INTRAVENOUS

## 2023-05-20 MED ORDER — MIDAZOLAM HCL 5 MG/5ML IJ SOLN
INTRAMUSCULAR | Status: AC
  Filled 2023-05-20: qty 5

## 2023-05-20 MED ORDER — ONDANSETRON HCL 4 MG/2ML IJ SOLN: 4.0000 mg | Freq: Four times a day (QID) | INTRAMUSCULAR | Status: DC | PRN

## 2023-05-20 MED ORDER — FENTANYL CITRATE PF 50 MCG/ML IJ SOSY: 12.5000 ug | PREFILLED_SYRINGE | Freq: Once | INTRAMUSCULAR | Status: DC | PRN

## 2023-05-20 MED ORDER — MIDAZOLAM HCL 2 MG/ML PO SYRP: 8.0000 mg | ORAL_SOLUTION | Freq: Once | ORAL | Status: DC | PRN

## 2023-05-20 MED ORDER — HEPARIN BOLUS VIA INFUSION
1000.0000 [IU] | Freq: Once | INTRAVENOUS | Status: AC
  Administered 2023-05-20: 1000 [IU] via INTRAVENOUS
  Filled 2023-05-20: qty 1000

## 2023-05-20 MED ORDER — IODIXANOL 320 MG/ML IV SOLN
INTRAVENOUS | Status: DC | PRN
  Administered 2023-05-20: 75 mL

## 2023-05-20 MED ORDER — FAMOTIDINE 20 MG PO TABS: 40.0000 mg | ORAL_TABLET | Freq: Once | ORAL | Status: DC | PRN

## 2023-05-20 MED ORDER — FENTANYL CITRATE (PF) 100 MCG/2ML IJ SOLN
INTRAMUSCULAR | Status: DC | PRN
  Administered 2023-05-20 (×2): 25 ug via INTRAVENOUS
  Administered 2023-05-20: 50 ug via INTRAVENOUS

## 2023-05-20 MED ORDER — DIPHENHYDRAMINE HCL 50 MG/ML IJ SOLN: 50.0000 mg | Freq: Once | INTRAMUSCULAR | Status: DC | PRN

## 2023-05-20 MED ORDER — HEPARIN (PORCINE) IN NACL 1000-0.9 UT/500ML-% IV SOLN
INTRAVENOUS | Status: DC | PRN
  Administered 2023-05-20: 1000 mL

## 2023-05-20 MED ORDER — FENTANYL CITRATE (PF) 100 MCG/2ML IJ SOLN
INTRAMUSCULAR | Status: AC
  Filled 2023-05-20: qty 2

## 2023-05-20 MED ORDER — METHYLPREDNISOLONE SODIUM SUCC 125 MG IJ SOLR: 125.0000 mg | Freq: Once | INTRAMUSCULAR | Status: DC | PRN

## 2023-05-20 MED ORDER — HYDROMORPHONE HCL 1 MG/ML IJ SOLN: 1.0000 mg | Freq: Once | INTRAMUSCULAR | Status: DC | PRN

## 2023-05-20 MED ORDER — MIDAZOLAM HCL 2 MG/2ML IJ SOLN
INTRAMUSCULAR | Status: DC | PRN
  Administered 2023-05-20: 2 mg via INTRAVENOUS
  Administered 2023-05-20 (×2): 1 mg via INTRAVENOUS

## 2023-05-20 MED ORDER — LIDOCAINE-EPINEPHRINE (PF) 1 %-1:200000 IJ SOLN
INTRAMUSCULAR | Status: DC | PRN
  Administered 2023-05-20: 20 mL

## 2023-05-20 MED ORDER — SODIUM CHLORIDE 0.9 % IV SOLN: INTRAVENOUS | Status: DC

## 2023-05-20 MED ORDER — HEPARIN SODIUM (PORCINE) 1000 UNIT/ML IJ SOLN
INTRAMUSCULAR | Status: DC | PRN
  Administered 2023-05-20: 5000 [IU] via INTRAVENOUS

## 2023-05-20 MED ORDER — CEFAZOLIN SODIUM-DEXTROSE 2-4 GM/100ML-% IV SOLN
2.0000 g | INTRAVENOUS | Status: AC
  Administered 2023-05-20: 2 g via INTRAVENOUS

## 2023-05-20 SURGICAL SUPPLY — 22 items
BALLN LUTONIX 018 5X80X130 (BALLOONS) ×2
BALLN LUTONIX DCB 5X40X130 (BALLOONS) ×2
BALLN ULTRVRSE 8X40X75C (BALLOONS) ×2
BALLOON LUTONIX 018 5X80X130 (BALLOONS) IMPLANT
BALLOON LUTONIX DCB 5X40X130 (BALLOONS) IMPLANT
BALLOON ULTRVRSE 8X40X75C (BALLOONS) IMPLANT
CATH ANGIO 5F PIGTAIL 65CM (CATHETERS) IMPLANT
CATH BEACON 5 .035 40 KMP TP (CATHETERS) IMPLANT
CATH BEACON 5 .038 40 KMP TP (CATHETERS) ×2
COVER PROBE ULTRASOUND 5X96 (MISCELLANEOUS) IMPLANT
DEVICE STARCLOSE SE CLOSURE (Vascular Products) IMPLANT
GLIDEWIRE ADV .035X180CM (WIRE) IMPLANT
KIT ENCORE 26 ADVANTAGE (KITS) IMPLANT
PACK ANGIOGRAPHY (CUSTOM PROCEDURE TRAY) ×2 IMPLANT
SHEATH BRITE TIP 5FRX11 (SHEATH) IMPLANT
SHEATH BRITE TIP 6FRX11 (SHEATH) IMPLANT
STENT LIFESTAR 8X60X80 (Permanent Stent) IMPLANT
STENT VIABAHN 6X7.5X120 (Permanent Stent) IMPLANT
SYR MEDRAD MARK 7 150ML (SYRINGE) IMPLANT
TUBING CONTRAST HIGH PRESS 72 (TUBING) IMPLANT
WIRE G 018X200 V18 (WIRE) IMPLANT
WIRE GUIDERIGHT .035X150 (WIRE) IMPLANT

## 2023-05-20 NOTE — Op Note (Signed)
[facility]  Percutaneous Study/Intervention Procedural Note   Date of Surgery: 05/20/2023  Surgeon(s):Kambree Krauss    Assistants:none  Pre-operative Diagnosis: PAD with ulceration LLE  Post-operative diagnosis:  Same  Procedure(s) Performed:             1.  Ultrasound guidance for vascular access right femoral artery             2.  Catheter placement into aorta from right femoral approach             3.  Aortogram and selective left lower extremity angiogram             4.  Stent placement to the right common iliac artery with 8 mm diameter by 6 cm length life star stent             5.  Stent placement to the right external iliac artery with 6 mm diameter by 7.5 cm length Viabahn stent  6.   StarClose closure device right femoral artery  EBL: 10 cc  Contrast: 55 cc  Fluoro Time: 5.9 minutes  Moderate Conscious Sedation Time: approximately 32 minutes using 4 mg of Versed and 100 mcg of Fentanyl              Indications:  Patient is a 72 y.o.male with a long history of PAD with nonhealing ulcerations and infection of the left foot. The patient is brought in for angiography for further evaluation and potential treatment.  Due to the limb threatening nature of the situation, angiogram was performed for attempted limb salvage. The patient is aware that if the procedure fails, amputation would be expected.  The patient also understands that even with successful revascularization, amputation may still be required due to the severity of the situation.  Risks and benefits are discussed and informed consent is obtained.   Procedure:  The patient was identified and appropriate procedural time out was performed.  The patient was then placed supine on the table and prepped and draped in the usual sterile fashion. Moderate conscious sedation was administered during a face to face encounter with the patient throughout the procedure with my supervision of the RN administering  medicines and monitoring the patient's vital signs, pulse oximetry, telemetry and mental status throughout from the start of the procedure until the patient was taken to the recovery room. Ultrasound was used to evaluate the right common femoral artery.  It was patent .  A digital ultrasound image was acquired.  A Seldinger needle was used to access the right common femoral artery under direct ultrasound guidance and a permanent image was performed.  A 0.035 J wire was advanced without resistance and a 5Fr sheath was placed.  Pigtail catheter was placed into the aorta and an AP aortogram was performed. This demonstrated fairly normal renal arteries.  The aorta was ectatic but not stenotic.  The left common iliac artery including the previously placed stent was patent.  The left external iliac artery was patent.  There was a stent in the right common iliac artery which was patent but below the stent it was heavily diseased in the mid to distal common iliac artery.  There was another area of disease in the right external iliac artery and the right external iliac artery was very small.  The common iliac artery stents were crossed and up into the aorta which would preclude up and over treatment of the left lower extremity today. Selective left lower extremity angiogram was  then performed with the catheter in the aorta. This demonstrated that the left common femoral artery and profunda femoris artery appeared to be patent.  The SFA had essentially a flush occlusion and was occluded throughout its course all the way down through the SFA and popliteal arteries.  There was not reconstitution until the proximal to mid posterior tibial artery which was then continuous to just above the ankle where it occluded.  There were large collaterals that then fed into the foot.  No other tibial vessels were patent.  The left lower extremity disease may require femme distal bypass or an approach through the posterior tibial artery in a  retrograde fashion, but this would not be possible today.  We did elect to treat the severe right iliac disease today.  The patient was systemically heparinized and a 6 French sheath was then placed over the [employer] wire. I then placed an 8 mm diameter x 6 cm length Lifestar stent into the right common iliac artery and down across the origin of the hypogastric artery into the proximal external iliac artery.  For the right external iliac artery disease a 6 mm diameter by 7.5 cm length Viabahn stent was taken down in the right external iliac artery from its proximal portion down to its distal portion.  The external iliac artery was postdilated with a 5 mm diameter Lutonix drug-coated balloon and the common iliac artery was postdilated with a 8 mm diameter balloon.  Completion imaging showed less than 10% residual stenosis in the right iliac arteries after stent placement.  I elected to terminate the procedure. The sheath was removed and StarClose closure device was deployed in the right femoral artery with excellent hemostatic result. The patient was taken to the recovery room in stable condition having tolerated the procedure well.  Findings:               Aortogram:  This demonstrated fairly normal renal arteries.  The aorta was ectatic but not stenotic.  The left common iliac artery including the previously placed stent was patent.  The left external iliac artery was patent.  There was a stent in the right common iliac artery which was patent but below the stent it was heavily diseased in the mid to distal common iliac artery.  There was another area of disease in the right external iliac artery and the right external iliac artery was very small.  The common iliac artery stents were crossed and up into the aorta which would preclude up and over treatment of the left lower extremity today.             Left Lower Extremity: The left common femoral artery and profunda femoris artery appeared to be patent.   The SFA had essentially a flush occlusion and was occluded throughout its course all the way down through the SFA and popliteal arteries.  There was not reconstitution until the proximal to mid posterior tibial artery which was then continuous to just above the ankle where it occluded.  There were large collaterals that then fed into the foot.  No other tibial vessels were patent.   Disposition: Patient was taken to the recovery room in stable condition having tolerated the procedure well.  Complications: None  [name] 05/20/2023 4:02 PM   This note was created with Dragon Medical transcription system. Any errors in dictation are purely unintentional.

## 2023-05-20 NOTE — Progress Notes (Signed)
  Progress Note   Patient: Jordan Arellano ZOX:[id number] DOB: Sep 22, 1951 DOA: 05/18/2023     2 DOS: the patient was seen and examined on 05/20/2023   Brief hospital course: Mr. Avidan Cleveringa is a 72 year old male with history of hypertension, hyperlipidemia, depression, TIA, who presents to the emergency department for chief concerns of left foot pain and redness with swelling. Patient had a significant ischemia, seen by vascular surgery, scheduled for angiogram 6/14.  Patient is also placed on broad-spectrum antibiotics for osteomyelitis.  MRI of the foot showed osteomyelitis of the first and third toes.  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) Active Problems:   Essential hypertension   Dyslipidemia   Depression   History of CVA (cerebrovascular accident) without residual deficits   Tobacco abuse   PAD s/p right BKA, history revascularization left leg   Hypertriglyceridemia   Status post femoral-popliteal bypass surgery   Left renal artery stenosis (HCC)   Recurrent major depressive disorder, in full remission (HCC)   Heart failure with preserved ejection fraction (HCC)   Encounter for tobacco use cessation counseling   At risk for constipation   Acute hematogenous osteomyelitis of left foot (HCC)   Assessment and Plan: Left lower extremity ischemia with ulceration. Acute osteomyelitis of the left foot. Status post right BKA. Tobacco abuse. Patient currently covered with cefepime and vancomycin for osteomyelitis. Patient is also on heparin drip for extremity ischemia. Patient is going for surgery for lower extremity angioplasty today, podiatry surgery tomorrow.    Essential hypertension  Chronic diastolic congestive heart failure. Continue home medicines.   History of CVA. Continue home medicines, hold antiplatelets for surgery.        Subjective:  Patient doing  well today, no complaint.  Physical Exam: Vitals:   05/19/23 2354 05/20/23 0350 05/20/23 0932 05/20/23 1305  BP:  103/62 113/65 117/71  Pulse:  73 68 69  Resp:  18 20 17   Temp:  98 F (36.7 C) 97.7 F (36.5 C) 97.7 F (36.5 C)  TempSrc:      SpO2:  98% 98% 97%  Weight: 64.4 kg      General exam: Appears calm and comfortable  Respiratory system: Clear to auscultation. Respiratory effort normal. Cardiovascular system: S1 & S2 heard, RRR. No JVD, murmurs, rubs, gallops or clicks. No pedal edema. Gastrointestinal system: Abdomen is nondistended, soft and nontender. No organomegaly or masses felt. Normal bowel sounds heard. Central nervous system: Alert and oriented. No focal neurological deficits. Extremities: Left lower extremity redness much improved, right lower extremity BKA. Skin: No rashes, lesions or ulcers Psychiatry: Judgement and insight appear normal. Mood & affect appropriate.    Data Reviewed:  Lab results reviewed.  Family Communication: Sister updated at bedside.  Disposition: Status is: Inpatient Remains inpatient appropriate because: Severity of disease, IV treatment, surgery.     Time spent: 35 minutes  Author: Marrion Coy, MD 05/20/2023 2:25 PM  For on call review [email or web address].

## 2023-05-20 NOTE — Progress Notes (Signed)
OT Cancellation Note  Patient Details Name: Jordan Arellano MRN: [id number] DOB: 1951-10-25   Cancelled Treatment:    Reason Eval/Treat Not Completed: Patient not medically ready. Chart reviewed. Noted plan for amputation next date. Will hold and initiate services after surgery as pt appropriate.   Kathie Dike, M.S. OTR/L  05/20/23, 1:28 PM  ascom [phone]

## 2023-05-20 NOTE — Progress Notes (Signed)
Transition of Care [facility]) - Inpatient Brief Assessment   Patient Details  Name: Jordan Arellano MRN: [id number] Date of Birth: 10-11-1951  Transition of [facility]) CM/SW Contact:    Truddie Hidden, RN Phone Number: 05/20/2023, 3:03 PM   Clinical Narrative: TOC continues to provide ongoing  assessment for needs and discharge planning.   Transition of Care Asessment: Insurance and Status: Insurance coverage has been reviewed Patient has primary care physician: Yes Home environment has been reviewed: return to previous environment Prior level of function:: independent Prior/Current Home Services: No current home services Social Determinants of Health Reivew: SDOH reviewed no interventions necessary Readmission risk has been reviewed: Yes Transition of care needs: no transition of care needs at this time

## 2023-05-20 NOTE — Progress Notes (Signed)
Pharmacy Antibiotic Note  Jordan Arellano is a 72 y.o. 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  Pharmacy has been consulted for vancomycin and cefepime dosing. Patient presented with left foot pain and redness with swelling. On presentation the patient stated having a left foot wound lasting 3-4 days. Patient has a history of PAD with previous revascularization. WBC have improved since starting ABX.  6/12 - Scr 1.24 - near baseline. Xray of foot: "Erosions are seen in the tip of distal phalanx of left big toe" - MRI has been ordered. 6/13 - Scr 1.11 - MRI showed a small ulceration at the distal aspect of the great toe with acute osteomyelitis of the great toe distal phalanx and acute osteomyelitis of the distal phalanx of the third toe. 6/14: Angio today with possible amputation on Saturday, renal function stable  Plan: Day #3 Continue vancomycin IV to 1250 mg every 24 hours Goal AUC 400-550 Estimated AUC 526.4, Cmin 12.0 Used TBW, Vd 0.72, Scr 1.11  Continue cefepime 2 grams every 12 hours (CrCl 30-60 mL/min)  Weight: 64.4 kg (141 lb 15.6 oz)  Temp (24hrs), Avg:97.9 F (36.6 C), Min:97.6 F (36.4 C), Max:98.3 F (36.8 C)  Recent Labs  Lab 05/18/23 1148 05/19/23 0606 05/20/23 0710  WBC 11.4* 10.0 8.8  CREATININE 1.24 1.11 1.12     Estimated Creatinine Clearance: 55.9 mL/min (by C-G formula based on SCr of 1.12 mg/dL).    No Known Allergies  Antimicrobials this admission: Ceftriaxone x 1  Vancomycin 6/12 >>  Cefepime 6/12 >>  Dose adjustments this admission: Vanocmycin 1000 mg Q24h >> 1250 mg Q24h  Microbiology results: No cultures ordered  Thank you for allowing pharmacy to be a part of this patient's care.  Nicolle Heward Rodriguez-Guzman PharmD, BCPS 05/20/2023 8:30 AM

## 2023-05-20 NOTE — Interval H&P Note (Signed)
History and Physical Interval Note:  05/20/2023 2:55 PM  Jordan Arellano  has presented today for surgery, with the diagnosis of PAD.  The various methods of treatment have been discussed with the patient and family. After consideration of risks, benefits and other options for treatment, the patient has consented to  Procedure(s): Lower Extremity Angiography (Left) as a surgical intervention.  The patient's history has been reviewed, patient examined, no change in status, stable for surgery.  I have reviewed the patient's chart and labs.  Questions were answered to the patient's satisfaction.     [name]

## 2023-05-20 NOTE — Care Management Important Message (Signed)
Important Message  Patient Details  Name: Jordan Arellano MRN: [id number] Date of Birth: 03/29/1951   Medicare Important Message Given:  N/A - LOS <3 / Initial given by admissions     [name] 05/20/2023, 11:10 AM

## 2023-05-20 NOTE — Consult Note (Signed)
ANTICOAGULATION CONSULT NOTE   Pharmacy Consult for heparin infusion Indication:  Lower extremity ischemia  No Known Allergies  Patient Measurements: Weight: 64.4 kg (141 lb 15.6 oz) Heparin Dosing Weight: 64.4 kg  Vital Signs: Temp: 97.8 F (36.6 C) (06/13 2335) Temp Source: Oral (06/13 2030) BP: 108/61 (06/13 2335) Pulse Rate: 75 (06/13 2335)  Labs: Recent Labs    05/18/23 1148 05/18/23 1521 05/18/23 2200 05/19/23 0606 05/19/23 1648 05/19/23 2317  HGB 10.6*  --   --  9.6*  --   --   HCT 33.5*  --   --  30.4*  --   --   PLT 274  --   --  237  --   --   APTT  --  36  --   --   --   --   LABPROT  --  14.3  --   --   --   --   INR  --  1.1  --   --   --   --   HEPARINUNFRC  --   --    < > 0.21* 0.37 0.33  CREATININE 1.24  --   --  1.11  --   --    < > = values in this interval not displayed.     Estimated Creatinine Clearance: 56.4 mL/min (by C-G formula based on SCr of 1.11 mg/dL).   Medical History: Past Medical History:  Diagnosis Date   Basal cell carcinoma 08/11/2022   R forearm - ED&C   Depression    Foot ulcer (HCC) 12/12/2016   Hyperlipidemia    Hypertension    PAD (peripheral artery disease) (HCC) ~2007   s/p R BKA for non-healing wound   Stroke (HCC) 03/2014   MRI: Acute nonhemorrhagic left paracentral pontine infarct. 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   TIA (transient ischemic attack) 01/2014    Medications:  No prior anticoagulation noted   Assessment: 72 year old male with history of hypertension, hyperlipidemia, depression, TIA, who presents to the emergency department for chief concerns of left foot pain and redness with swelling. Pt with history of prior lower extremity stents placed 02/2023. Pharmacy has been consulted to initiate and manage IV heparin therapy for lower extremity aterial occlusion   Goal of Therapy:  Heparin level 0.3-0.7 units/ml Monitor  platelets by anticoagulation protocol: Yes   06/12 2200 HL 0.30, therapeutic x 1 06/13 0606 HL 0.21, subtherapeutic @ 1100 u/hr 06/13 1648 HL 0.37 therapeutic x 1 @ 1200 units/hr 06/13 2317 HL 0.33 therapeutic x 2  Plan:  HL therapeutic Continue heparin infusion at 1200 units/hr Recheck HL daily w/ AM labs while therapeutic Daily CBC while on heparin  Otelia Sergeant, PharmD, [facility] 05/20/2023 12:36 AM

## 2023-05-20 NOTE — Progress Notes (Signed)
PT Cancellation Note  Patient Details Name: Jordan Arellano MRN: [id number] DOB: 02-03-1951   Cancelled Treatment:    Reason Eval/Treat Not Completed: Other (comment).  PT consult received.  Chart reviewed.  Per notes pt scheduled for angiogram today and surgery on L foot tomorrow/Saturday.  Will hold PT evaluation/full OOB assessment at this time until after surgery performed and WB'ing precautions are clarified.  Hendricks Limes, PT 05/20/23, 1:22 PM

## 2023-05-20 NOTE — Consult Note (Signed)
ANTICOAGULATION CONSULT NOTE   Pharmacy Consult for heparin infusion Indication:  Lower extremity ischemia  No Known Allergies  Patient Measurements: Weight: 64.4 kg (141 lb 15.6 oz) Heparin Dosing Weight: 64.4 kg  Vital Signs: Temp: 98 F (36.7 C) (06/14 0350) Temp Source: Oral (06/13 2030) BP: 103/62 (06/14 0350) Pulse Rate: 73 (06/14 0350)  Labs: Recent Labs    05/18/23 1148 05/18/23 1521 05/18/23 2200 05/19/23 0606 05/19/23 1648 05/19/23 2317 05/20/23 0710  HGB 10.6*  --   --  9.6*  --   --  10.0*  HCT 33.5*  --   --  30.4*  --   --  31.3*  PLT 274  --   --  237  --   --  231  APTT  --  36  --   --   --   --   --   LABPROT  --  14.3  --   --   --   --   --   INR  --  1.1  --   --   --   --   --   HEPARINUNFRC  --   --    < > 0.21* 0.37 0.33 0.26*  CREATININE 1.24  --   --  1.11  --   --  1.12   < > = values in this interval not displayed.    Estimated Creatinine Clearance: 55.9 mL/min (by C-G formula based on SCr of 1.12 mg/dL).  Medical History: Past Medical History:  Diagnosis Date   Basal cell carcinoma 08/11/2022   R forearm - ED&C   Depression    Foot ulcer (HCC) 12/12/2016   Hyperlipidemia    Hypertension    PAD (peripheral artery disease) (HCC) ~2007   s/p R BKA for non-healing wound   Stroke (HCC) 03/2014   MRI: Acute nonhemorrhagic left paracentral pontine infarct. 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   TIA (transient ischemic attack) 01/2014    Medications:  No prior anticoagulation noted   Assessment: 72 year old male with history of hypertension, hyperlipidemia, depression, TIA, who presents to the emergency department for chief concerns of left foot pain and redness with swelling. Pt with history of prior lower extremity stents placed 02/2023. Pharmacy has been consulted to initiate and manage IV heparin therapy for lower extremity aterial occlusion   *Patient  in need of toe amputation. Plan angio for 05/20/2023 and possible amputation on Saturday*  Goal of Therapy:  Heparin level 0.3-0.7 units/ml Monitor platelets by anticoagulation protocol: Yes   06/12 2200 HL 0.30, therapeutic x 1 06/13 0606 HL 0.21, subtherapeutic @ 1100 u/hr 06/13 1648 HL 0.37 therapeutic x 1 @ 1200 units/hr 06/13 2317 HL 0.33 therapeutic x 2 06/14 0710 HL 0.26, sub-therapeutic  Plan:   Bolus heparin 1000 un x 1 Increase heparin infusion rate to 1300 units/hr Recheck HL 6 hrs after rate change and daily w/ AM labs while therapeutic Daily CBC while on heparin  Jozsef Wescoat Rodriguez-Guzman PharmD, BCPS 05/20/2023 8:23 AM

## 2023-05-20 NOTE — Consult Note (Addendum)
ANTICOAGULATION CONSULT NOTE   Pharmacy Consult for heparin infusion Indication:  Lower extremity ischemia  No Known Allergies  Patient Measurements: Height: 6\' 1"  (185.4 cm) Weight: 64.4 kg (141 lb 15.6 oz) IBW/kg (Calculated) : 79.9 Heparin Dosing Weight: 64.4 kg  Vital Signs: Temp: 98.3 F (36.8 C) (06/14 1444) Temp Source: Oral (06/14 1444) BP: 100/56 (06/14 1630) Pulse Rate: 63 (06/14 1630)  Labs: Recent Labs    05/18/23 1148 05/18/23 1521 05/18/23 2200 05/19/23 0606 05/19/23 1648 05/19/23 2317 05/20/23 0710  HGB 10.6*  --   --  9.6*  --   --  10.0*  HCT 33.5*  --   --  30.4*  --   --  31.3*  PLT 274  --   --  237  --   --  231  APTT  --  36  --   --   --   --   --   LABPROT  --  14.3  --   --   --   --   --   INR  --  1.1  --   --   --   --   --   HEPARINUNFRC  --   --    < > 0.21* 0.37 0.33 0.26*  CREATININE 1.24  --   --  1.11  --   --  1.12   < > = values in this interval not displayed.    Estimated Creatinine Clearance: 55.9 mL/min (by C-G formula based on SCr of 1.12 mg/dL).  Medical History: Past Medical History:  Diagnosis Date   Basal cell carcinoma 08/11/2022   R forearm - ED&C   Depression    Foot ulcer (HCC) 12/12/2016   Hyperlipidemia    Hypertension    PAD (peripheral artery disease) (HCC) ~2007   s/p R BKA for non-healing wound   Stroke (HCC) 03/2014   MRI: Acute nonhemorrhagic left paracentral pontine infarct. 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   TIA (transient ischemic attack) 01/2014    Medications:  No prior anticoagulation noted   Assessment: 72 year old male with history of hypertension, hyperlipidemia, depression, TIA, who presents to the emergency department for chief concerns of left foot pain and redness with swelling. Pt with history of prior lower extremity stents placed 02/2023. Pharmacy has been consulted to initiate and manage IV heparin  therapy for lower extremity aterial occlusion   *Patient in need of toe amputation. Plan angio for 05/20/2023 and possible amputation on Saturday*  Goal of Therapy:  Heparin level 0.3-0.7 units/ml Monitor platelets by anticoagulation protocol: Yes   06/12 2200 HL 0.30, therapeutic x 1 06/13 0606 HL 0.21, subtherapeutic @ 1100 u/hr 06/13 1648 HL 0.37 therapeutic x 1 @ 1200 units/hr 06/13 2317 HL 0.33 therapeutic x 2 06/14 0710 HL 0.26, sub-therapeutic  Plan:  s/p procedure on 05/20/23 Resume heparin infusion rate of 1300 units/hr without bolus per vascular plans Recheck HL 6 hrs after rate change and daily w/ AM labs while therapeutic Daily CBC while on heparin    Elliot Gurney, PharmD, BCPS Clinical Pharmacist  05/20/2023 6:38 PM

## 2023-05-21 ENCOUNTER — Inpatient Hospital Stay: Payer: 59 | Admitting: Anesthesiology

## 2023-05-21 ENCOUNTER — Encounter
Admission: EM | Disposition: A | Discharge status: Skilled Nursing Facility | Payer: Self-pay | Source: Home / Self Care | Attending: Internal Medicine

## 2023-05-21 HISTORY — PX: AMPUTATION TOE: SHX6595

## 2023-05-21 LAB — BASIC METABOLIC PANEL
Anion gap: 10 (ref 5–15)
BUN: 27 mg/dL — ABNORMAL HIGH (ref 8–23)
CO2: 17 mmol/L — ABNORMAL LOW (ref 22–32)
Calcium: 8.7 mg/dL — ABNORMAL LOW (ref 8.9–10.3)
Chloride: 111 mmol/L (ref 98–111)
Creatinine, Ser: 1.53 mg/dL — ABNORMAL HIGH (ref 0.61–1.24)
GFR, Estimated: 49 mL/min — ABNORMAL LOW (ref >60)
Glucose, Bld: 125 mg/dL — ABNORMAL HIGH (ref 70–99)
Potassium: 3.5 mmol/L (ref 3.5–5.1)
Sodium: 138 mmol/L (ref 135–145)

## 2023-05-21 LAB — GLUCOSE, CAPILLARY
Glucose-Capillary: 115 mg/dL — ABNORMAL HIGH (ref 70–99)
Glucose-Capillary: 120 mg/dL — ABNORMAL HIGH (ref 70–99)
Glucose-Capillary: 112 mg/dL — ABNORMAL HIGH (ref 70–99)
Glucose-Capillary: 94 mg/dL (ref 70–99)
Glucose-Capillary: 130 mg/dL — ABNORMAL HIGH (ref 70–99)

## 2023-05-21 LAB — CBC
HCT: 27 % — ABNORMAL LOW (ref 39.0–52.0)
Hemoglobin: 8.8 g/dL — ABNORMAL LOW (ref 13.0–17.0)
MCH: 30.3 pg (ref 26.0–34.0)
MCHC: 32.6 g/dL (ref 30.0–36.0)
MCV: 93.1 fL (ref 80.0–100.0)
Platelets: 208 10*3/uL (ref 150–400)
RBC: 2.9 MIL/uL — ABNORMAL LOW (ref 4.22–5.81)
RDW: 16.5 % — ABNORMAL HIGH (ref 11.5–15.5)
WBC: 15.5 10*3/uL — ABNORMAL HIGH (ref 4.0–10.5)
nRBC: 0 % (ref 0.0–0.2)

## 2023-05-21 LAB — BLOOD GAS, VENOUS
Acid-base deficit: 6.6 mmol/L — ABNORMAL HIGH (ref 0.0–2.0)
Bicarbonate: 18.5 mmol/L — ABNORMAL LOW (ref 20.0–28.0)
O2 Saturation: 72.4 %
Patient temperature: 37
pCO2, Ven: 35 mmHg — ABNORMAL LOW (ref 44–60)
pH, Ven: 7.33 (ref 7.25–7.43)
pO2, Ven: 43 mmHg (ref 32–45)

## 2023-05-21 LAB — LACTIC ACID, PLASMA: Lactic Acid, Venous: 1.6 mmol/L (ref 0.5–1.9)

## 2023-05-21 LAB — AEROBIC/ANAEROBIC CULTURE W GRAM STAIN (SURGICAL/DEEP WOUND)

## 2023-05-21 LAB — MAGNESIUM: Magnesium: 1.3 mg/dL — ABNORMAL LOW (ref 1.7–2.4)

## 2023-05-21 LAB — CULTURE, BLOOD (ROUTINE X 2): Special Requests: ADEQUATE

## 2023-05-21 LAB — HEPARIN LEVEL (UNFRACTIONATED): Heparin Unfractionated: 0.32 IU/mL (ref 0.30–0.70)

## 2023-05-21 SURGERY — AMPUTATION, TOE
Anesthesia: Monitor Anesthesia Care | Site: Toe | Laterality: Left

## 2023-05-21 MED ORDER — MORPHINE SULFATE (PF) 2 MG/ML IV SOLN
2.0000 mg | INTRAVENOUS | Status: DC | PRN
  Administered 2023-05-21 – 2023-05-24 (×6): 2 mg via INTRAVENOUS
  Filled 2023-05-21 (×7): qty 1

## 2023-05-21 MED ORDER — 0.9 % SODIUM CHLORIDE (POUR BTL) OPTIME
TOPICAL | Status: DC | PRN
  Administered 2023-05-21: 1000 mL

## 2023-05-21 MED ORDER — MIDAZOLAM HCL 2 MG/2ML IJ SOLN
INTRAMUSCULAR | Status: DC | PRN
  Administered 2023-05-21: 1 mg via INTRAVENOUS

## 2023-05-21 MED ORDER — MIDAZOLAM HCL 2 MG/2ML IJ SOLN
INTRAMUSCULAR | Status: AC
  Filled 2023-05-21: qty 2

## 2023-05-21 MED ORDER — FENTANYL CITRATE (PF) 100 MCG/2ML IJ SOLN
INTRAMUSCULAR | Status: AC
  Filled 2023-05-21: qty 2

## 2023-05-21 MED ORDER — SODIUM CHLORIDE 0.9 % IV SOLN: INTRAVENOUS | Status: DC | PRN

## 2023-05-21 MED ORDER — FENTANYL CITRATE (PF) 100 MCG/2ML IJ SOLN
INTRAMUSCULAR | Status: DC | PRN
  Administered 2023-05-21 (×2): 25 ug via INTRAVENOUS

## 2023-05-21 MED ORDER — VANCOMYCIN VARIABLE DOSE PER UNSTABLE RENAL FUNCTION (PHARMACIST DOSING): Status: DC

## 2023-05-21 MED ORDER — BUPIVACAINE HCL 0.5 % IJ SOLN
INTRAMUSCULAR | Status: DC | PRN
  Administered 2023-05-21: 10 mL

## 2023-05-21 MED ORDER — NOREPINEPHRINE 4 MG/250ML-% IV SOLN
2.0000 ug/min | INTRAVENOUS | Status: DC
  Administered 2023-05-21 – 2023-05-22 (×2): 2 ug/min via INTRAVENOUS
  Filled 2023-05-21 (×2): qty 250

## 2023-05-21 MED ORDER — CHLORHEXIDINE GLUCONATE CLOTH 2 % EX PADS
6.0000 | MEDICATED_PAD | Freq: Every day | CUTANEOUS | Status: DC
  Administered 2023-05-22 – 2023-06-02 (×12): 6 via TOPICAL

## 2023-05-21 MED ORDER — MIDODRINE HCL 5 MG PO TABS
10.0000 mg | ORAL_TABLET | Freq: Once | ORAL | Status: AC
  Administered 2023-05-21: 10 mg via ORAL
  Filled 2023-05-21: qty 2

## 2023-05-21 MED ORDER — SODIUM CHLORIDE 0.9 % IV SOLN
250.0000 mL | INTRAVENOUS | Status: DC
  Administered 2023-05-21: 250 mL via INTRAVENOUS

## 2023-05-21 MED ORDER — OXYCODONE-ACETAMINOPHEN 5-325 MG PO TABS
1.0000 | ORAL_TABLET | ORAL | Status: DC | PRN
  Administered 2023-05-21 – 2023-05-24 (×5): 2 via ORAL
  Filled 2023-05-21 (×5): qty 2

## 2023-05-21 MED ORDER — LACTATED RINGERS IV SOLN: INTRAVENOUS | Status: DC

## 2023-05-21 SURGICAL SUPPLY — 53 items
BLADE MED AGGRESSIVE (BLADE) ×1 IMPLANT
BLADE OSC/SAGITTAL MD 5.5X18 (BLADE) ×1 IMPLANT
BLADE SURG 15 STRL LF DISP TIS (BLADE) ×2 IMPLANT
BLADE SURG 15 STRL SS (BLADE) ×2
BLADE SURG MINI STRL (BLADE) ×1 IMPLANT
BNDG CMPR 5X4 KNIT ELC UNQ LF (GAUZE/BANDAGES/DRESSINGS) ×1
BNDG CMPR 75X21 PLY HI ABS (MISCELLANEOUS) ×1
BNDG ELASTIC 4INX 5YD STR LF (GAUZE/BANDAGES/DRESSINGS) ×1 IMPLANT
BNDG ESMARCH 4 X 12 STRL LF (GAUZE/BANDAGES/DRESSINGS) ×1
BNDG ESMARCH 4X12 STRL LF (GAUZE/BANDAGES/DRESSINGS) ×1 IMPLANT
BNDG GAUZE DERMACEA FLUFF 4 (GAUZE/BANDAGES/DRESSINGS) ×1 IMPLANT
BNDG GZE 12X3 1 PLY HI ABS (GAUZE/BANDAGES/DRESSINGS) ×1
BNDG GZE DERMACEA 4 6PLY (GAUZE/BANDAGES/DRESSINGS) ×1
BNDG STRETCH GAUZE 3IN X12FT (GAUZE/BANDAGES/DRESSINGS) ×1 IMPLANT
CUFF TOURN SGL QUICK 12 (TOURNIQUET CUFF) ×1 IMPLANT
CUFF TOURN SGL QUICK 18X4 (TOURNIQUET CUFF) ×1 IMPLANT
DRAPE FLUOR MINI C-ARM 54X84 (DRAPES) ×1 IMPLANT
DURAPREP 26ML APPLICATOR (WOUND CARE) ×1 IMPLANT
ELECT REM PT RETURN 9FT ADLT (ELECTROSURGICAL) ×1
ELECTRODE REM PT RTRN 9FT ADLT (ELECTROSURGICAL) ×1 IMPLANT
GAUZE SPONGE 4X4 12PLY STRL (GAUZE/BANDAGES/DRESSINGS) ×1 IMPLANT
GAUZE STRETCH 2X75IN STRL (MISCELLANEOUS) ×1 IMPLANT
GAUZE XEROFORM 1X8 LF (GAUZE/BANDAGES/DRESSINGS) ×1 IMPLANT
GLOVE BIO SURGEON STRL SZ7.5 (GLOVE) ×1 IMPLANT
GLOVE INDICATOR 8.0 STRL GRN (GLOVE) ×1 IMPLANT
GOWN STRL REUS W/ TWL LRG LVL3 (GOWN DISPOSABLE) ×2 IMPLANT
GOWN STRL REUS W/TWL LRG LVL3 (GOWN DISPOSABLE) ×2
HANDPIECE VERSAJET DEBRIDEMENT (MISCELLANEOUS) ×1 IMPLANT
IV NS IRRIG 3000ML ARTHROMATIC (IV SOLUTION) ×1 IMPLANT
KIT TURNOVER KIT A (KITS) ×1 IMPLANT
LABEL OR SOLS (LABEL) ×1 IMPLANT
MANIFOLD NEPTUNE II (INSTRUMENTS) ×1 IMPLANT
NDL FILTER BLUNT 18X1 1/2 (NEEDLE) ×1 IMPLANT
NDL HYPO 25X1 1.5 SAFETY (NEEDLE) ×2 IMPLANT
NEEDLE FILTER BLUNT 18X1 1/2 (NEEDLE) ×1 IMPLANT
NEEDLE HYPO 25X1 1.5 SAFETY (NEEDLE) ×2 IMPLANT
NS IRRIG 500ML POUR BTL (IV SOLUTION) ×1 IMPLANT
PACK EXTREMITY ARMC (MISCELLANEOUS) ×1 IMPLANT
PAD ABD DERMACEA PRESS 5X9 (GAUZE/BANDAGES/DRESSINGS) ×2 IMPLANT
SOL PREP PVP 2OZ (MISCELLANEOUS) ×1
SOLUTION PREP PVP 2OZ (MISCELLANEOUS) ×1 IMPLANT
STOCKINETTE BIAS CUT 4 980044 (GAUZE/BANDAGES/DRESSINGS) ×1 IMPLANT
STOCKINETTE STRL 6IN 960660 (GAUZE/BANDAGES/DRESSINGS) ×1 IMPLANT
STRIP CLOSURE SKIN 1/4X4 (GAUZE/BANDAGES/DRESSINGS) ×1 IMPLANT
SUT ETHILON 3-0 FS-10 30 BLK (SUTURE) ×2
SUT ETHILON 4-0 (SUTURE)
SUT ETHILON 4-0 FS2 18XMFL BLK (SUTURE)
SUTURE EHLN 3-0 FS-10 30 BLK (SUTURE) ×1 IMPLANT
SUTURE ETHLN 4-0 FS2 18XMF BLK (SUTURE) IMPLANT
SWAB CULTURE AMIES ANAERIB BLU (MISCELLANEOUS) ×1 IMPLANT
SYR 10ML LL (SYRINGE) ×1 IMPLANT
TRAP FLUID SMOKE EVACUATOR (MISCELLANEOUS) ×1 IMPLANT
WATER STERILE IRR 500ML POUR (IV SOLUTION) ×1 IMPLANT

## 2023-05-21 NOTE — Progress Notes (Signed)
An USGPIV (ultrasound guided PIV) has been placed for short-term vasopressor infusion. A correctly placed ivWatch must be used when administering Vasopressors. Should this treatment be needed beyond 72 hours, central line access should be obtained.  It will be the responsibility of the bedside nurse to follow best practice to prevent extravasations.   

## 2023-05-21 NOTE — Anesthesia Procedure Notes (Signed)
Date/Time: 05/21/2023 8:30 AM  Performed by: Stormy Fabian, CRNAPre-anesthesia Checklist: Patient identified, Emergency Drugs available, Suction available and Patient being monitored Patient Re-evaluated:Patient Re-evaluated prior to induction Oxygen Delivery Method: Simple face mask Induction Type: IV induction Dental Injury: Teeth and Oropharynx as per pre-operative assessment

## 2023-05-21 NOTE — Progress Notes (Signed)
   05/21/23 1500  Spiritual Encounters  Type of Visit Initial  Care provided to: Patient  Referral source Other (comment)  Reason for visit Surgical  OnCall Visit Yes   Chaplain provided a follow up visit after brief encounter in Recovery

## 2023-05-21 NOTE — Accreditation Note (Signed)
Pt transported to OR via bed accompanied by anesthesia and OR transporter; on telemetry, heparin infusion and norepinephrine infusion. Alert and oriented, denies pain at this time. Visitors at bedside.

## 2023-05-21 NOTE — Consult Note (Addendum)
ANTICOAGULATION CONSULT NOTE   Pharmacy Consult for heparin infusion Indication:  Lower extremity ischemia  No Known Allergies  Patient Measurements: Height: 6\' 1"  (185.4 cm) Weight: 64.4 kg (141 lb 15.6 oz) IBW/kg (Calculated) : 79.9 Heparin Dosing Weight: 64.4 kg  Vital Signs: Temp: 99.3 F (37.4 C) (06/14 2355) Temp Source: Oral (06/14 2355) BP: 90/51 (06/15 0012) Pulse Rate: 91 (06/15 0012)  Labs: Recent Labs    05/18/23 1148 05/18/23 1521 05/18/23 2200 05/19/23 0606 05/19/23 1648 05/19/23 2317 05/20/23 0710 05/20/23 2258  HGB 10.6*  --   --  9.6*  --   --  10.0* 10.1*  HCT 33.5*  --   --  30.4*  --   --  31.3* 31.7*  PLT 274  --   --  237  --   --  231 218  APTT  --  36  --   --   --   --   --   --   LABPROT  --  14.3  --   --   --   --   --   --   INR  --  1.1  --   --   --   --   --   --   HEPARINUNFRC  --   --    < > 0.21*   < > 0.33 0.26* 0.48  CREATININE 1.24  --   --  1.11  --   --  1.12  --    < > = values in this interval not displayed.    Estimated Creatinine Clearance: 55.9 mL/min (by C-G formula based on SCr of 1.12 mg/dL).  Medical History: Past Medical History:  Diagnosis Date   Basal cell carcinoma 08/11/2022   R forearm - ED&C   Depression    Foot ulcer (HCC) 12/12/2016   Hyperlipidemia    Hypertension    PAD (peripheral artery disease) (HCC) ~2007   s/p R BKA for non-healing wound   Stroke (HCC) 03/2014   MRI: Acute nonhemorrhagic left paracentral pontine infarct. 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   TIA (transient ischemic attack) 01/2014    Medications:  No prior anticoagulation noted   Assessment: 72 year old male with history of hypertension, hyperlipidemia, depression, TIA, who presents to the emergency department for chief concerns of left foot pain and redness with swelling. Pt with history of prior lower extremity stents placed 02/2023. Pharmacy  has been consulted to initiate and manage IV heparin therapy for lower extremity aterial occlusion   *Patient in need of toe amputation. Plan angio for 05/20/2023 and possible amputation on Saturday*  Goal of Therapy:  Heparin level 0.3-0.7 units/ml Monitor platelets by anticoagulation protocol: Yes   06/12 2200 HL 0.30, therapeutic x 1 06/13 0606 HL 0.21, subtherapeutic @ 1100 u/hr 06/13 1648 HL 0.37 therapeutic x 1 @ 1200 units/hr 06/13 2317 HL 0.33 therapeutic x 2 06/14 0710 HL 0.26, sub-therapeutic 06/14 2258 HL 0.48, therapeutic x 1  Plan:  s/p procedure on 05/20/23 Continue heparin infusion rate of 1300 units/hr without bolus per vascular plans Recheck HL in 8 hr to confirm, then daily Daily CBC while on heparin  Otelia Sergeant, PharmD, [facility] 05/21/2023 12:18 AM

## 2023-05-21 NOTE — Op Note (Signed)
Date of operation: 05/21/2023.  Surgeon: Ricci Barker D.P.M.  Preoperative diagnosis: Osteomyelitis left first and third toes.  Postoperative diagnosis: Same.  Procedures: 1.  Amputation left great toe at the IPJ. 2.  Amputation left third toe metatarsophalangeal joint.  Anesthesia: Local MAC.  Hemostasis: None.  Estimated blood loss: 5 cc.  Cultures: Bone culture distal phalanx left first and third toes.  Pathology: Left first and third toes.  Complications: None apparent other than minimal bleeding.  Operative indications: This is a 72 year old male with history of diabetes and significant peripheral vascular disease with recent development of ulcers on his left third and first toes.  X-rays and MRI revealed bone infection and decision was made for amputation of the distal aspect of the first and third toes.  Operative procedure: Patient was taken to the operating room and placed on the table in the supine position.  Following satisfactory sedation the left foot was anesthetized with 10 cc of 0.5% Marcaine plain around the first and third rays.  The left foot was then prepped and draped in usual sterile fashion.  Attention directed toward the left great toe where a fishmouth incision was made from medial to lateral around the dorsal and plantar aspect of the toe just proximal to the ulceration.  Incision carried sharply down to the level of the bone and dissection carried back sharply along the bone to the level of the inner phalangeal joint where the toe was disarticulated and removed in toto.  Portion of the distal phalanx was then recovered for culture and the toe passed off for pathology.  The wound was flushed with copious amounts of sterile saline and closed using 3-0 nylon vertical mattress and simple interrupted sutures. Attention was then directed to the third toe where an incision was made from dorsal to plantar around the medial and lateral base of the third toe.  Incision  carried sharply down to the level of the bone and dissection carried back to the level of the metatarsal phalangeal joint where the toe was disarticulated and removed in toto.  Again a portion of the bone from the distal phalanx was then recovered for culture and the toe passed off for pathology.  The wound was flushed with copious amounts of sterile saline and closed using 3-0 nylon vertical mattress and simple interrupted sutures.  Xeroform then applied to the incisions followed by 4 x 4's and conformer.  Curlex and an Ace wrap then applied for compression.  Patient tolerated the anesthesia and procedures well and was transported to the PACU in stable condition.

## 2023-05-21 NOTE — Anesthesia Preprocedure Evaluation (Addendum)
Anesthesia Evaluation  Patient identified by MRN, date of birth, ID band Patient awake  General Assessment Comment:  Patient transferred to ICU yesterday for septic shock, currently on norepinephrine peripherally, blood pressures in the 90s/50s on my exam. Patient AO x 3. Suspected source is from infected toes.  Reviewed: Allergy & Precautions, NPO status , Patient's Chart, lab work & pertinent test results  History of Anesthesia Complications Negative for: history of anesthetic complications  Airway Mallampati: III  TM Distance: >3 FB Neck ROM: Full    Dental  (+) Upper Dentures, Lower Dentures   Pulmonary neg sleep apnea, neg COPD, Current Smoker and Patient abstained from smoking.   Pulmonary exam normal breath sounds clear to auscultation       Cardiovascular Exercise Tolerance: Poor METShypertension, Pt. on medications + Peripheral Vascular Disease  (-) CAD and (-) Past MI (-) dysrhythmias  Rhythm:Regular Rate:Normal - Systolic murmurs Last documented TTE was in 2015, only notable for Grade I diastolic dysfunction   Neuro/Psych  PSYCHIATRIC DISORDERS  Depression    Patient claims he has residual left foot numbness, but it is unclear if it is sequelae from stroke, or neuropathy from diabetes CVA, Residual Symptoms    GI/Hepatic ,neg GERD  ,,(+)     (-) substance abuse    Endo/Other  diabetes, Well Controlled    Renal/GU negative Renal ROS     Musculoskeletal   Abdominal   Peds  Hematology   Anesthesia Other Findings Past Medical History: 08/11/2022: Basal cell carcinoma     Comment:  R forearm - ED&C No date: Depression 12/12/2016: Foot ulcer (HCC) No date: Hyperlipidemia No date: Hypertension ~2007: PAD (peripheral artery disease) (HCC)     Comment:  s/p R BKA for non-healing wound 03/2014: Stroke [facility])     Comment:  MRI: Acute nonhemorrhagic left paracentral pontine               infarct. Arterial  venous malformation left hippocampus               with nidus measuring  12x9,8 mm ; Left vertebral artery               is occluded. 01/2014: TIA (transient ischemic attack)  Reproductive/Obstetrics                             Anesthesia Physical Anesthesia Plan  ASA: 3  Anesthesia Plan: MAC   Post-op Pain Management: Minimal or no pain anticipated   Induction: Intravenous  PONV Risk Score and Plan: 0 and Propofol infusion, TIVA, Ondansetron, Treatment may vary due to age or medical condition and Midazolam  Airway Management Planned: Nasal Cannula  Additional Equipment: None  Intra-op Plan:   Post-operative Plan:   Informed Consent: I have reviewed the patients History and Physical, chart, labs and discussed the procedure including the risks, benefits and alternatives for the proposed anesthesia with the patient or authorized representative who has indicated his/her understanding and acceptance.     Dental advisory given  Plan Discussed with: CRNA and Surgeon  Anesthesia Plan Comments: (Discussed risks of anesthesia with patient, including possibility of difficulty with spontaneous ventilation under anesthesia necessitating airway intervention, PONV, and rare risks such as cardiac or respiratory or neurological events, and allergic reactions. Discussed the role of CRNA in patient's perioperative care. Patient understands.)       Anesthesia Quick Evaluation

## 2023-05-21 NOTE — Progress Notes (Signed)
Returned from OR via bed accompanied by RN; received bedside report. Pt is alert and oriented; denies pain; left foot dressing CDI and wrapped with ace. Levo continues at 3 mcg, IV watch in place. heparin at 1300 units per hour. LR started at 100 cc per hour as ordered.

## 2023-05-21 NOTE — Anesthesia Postprocedure Evaluation (Signed)
Anesthesia Post Note  Patient: Jordan Arellano  Procedure(s) Performed: AMPUTATION LEFT 1st & 3rd TOES (Left: Toe)  Patient location during evaluation: PACU Anesthesia Type: MAC Level of consciousness: awake and alert Pain management: pain level controlled Vital Signs Assessment: post-procedure vital signs reviewed and stable Respiratory status: spontaneous breathing, nonlabored ventilation, respiratory function stable and patient connected to nasal cannula oxygen Cardiovascular status: blood pressure returned to baseline and stable Postop Assessment: no apparent nausea or vomiting Anesthetic complications: no   No notable events documented.   Last Vitals:  Vitals:   05/21/23 0945 05/21/23 1004  BP: (!) 96/55 (!) 104/59  Pulse: 85 96  Resp: 20 16  Temp: 36.8 C 36.9 C  SpO2: 95% 95%    Last Pain:  Vitals:   05/21/23 1004  TempSrc: Oral  PainSc: 0-No pain                 Corinda Gubler

## 2023-05-21 NOTE — Transfer of Care (Signed)
Immediate Anesthesia Transfer of Care Note  Patient: Jordan Arellano  Procedure(s) Performed: Procedure(s): AMPUTATION LEFT 1st & 3rd TOES (Left)  Patient Location: PACU  Anesthesia Type:MAC  Level of Consciousness: AWAKE  Airway & Oxygen Therapy: Patient Spontanous Breathing  Post-op Assessment: Report given to RN and Post -op Vital signs reviewed and stable  Post vital signs: Reviewed and stable  Last Vitals:  Vitals:   05/21/23 0800 05/21/23 0815  BP: (!) 100/50   Pulse: 85 82  Resp: (!) 25 (!) 21  Temp:  (!) 38.2 C  SpO2: 94% 94%    Complications: No apparent anesthesia complications

## 2023-05-21 NOTE — Consult Note (Signed)
ANTICOAGULATION CONSULT NOTE   Pharmacy Consult for heparin infusion Indication:  Lower extremity ischemia  No Known Allergies  Patient Measurements: Height: 6\' 1"  (185.4 cm) Weight: 64.4 kg (141 lb 15.6 oz) IBW/kg (Calculated) : 79.9 Heparin Dosing Weight: 64.4 kg  Vital Signs: Temp: 100.7 F (38.2 C) (06/15 0815) Temp Source: Oral (06/15 0815) BP: 100/50 (06/15 0800) Pulse Rate: 82 (06/15 0815)  Labs: Recent Labs    05/18/23 1521 05/18/23 2200 05/19/23 0606 05/19/23 1648 05/20/23 0710 05/20/23 2258 05/21/23 0524 05/21/23 0657  HGB  --   --  9.6*  --  10.0* 10.1* 8.8*  --   HCT  --   --  30.4*  --  31.3* 31.7* 27.0*  --   PLT  --   --  237  --  231 218 208  --   APTT 36  --   --   --   --   --   --   --   LABPROT 14.3  --   --   --   --   --   --   --   INR 1.1  --   --   --   --   --   --   --   HEPARINUNFRC  --    < > 0.21*   < > 0.26* 0.48  --  0.32  CREATININE  --   --  1.11  --  1.12  --  1.53*  --    < > = values in this interval not displayed.    Estimated Creatinine Clearance: 40.9 mL/min (A) (by C-G formula based on SCr of 1.53 mg/dL (H)).  Medical History: Past Medical History:  Diagnosis Date   Basal cell carcinoma 08/11/2022   R forearm - ED&C   Depression    Foot ulcer (HCC) 12/12/2016   Hyperlipidemia    Hypertension    PAD (peripheral artery disease) (HCC) ~2007   s/p R BKA for non-healing wound   Stroke (HCC) 03/2014   MRI: Acute nonhemorrhagic left paracentral pontine infarct. 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   TIA (transient ischemic attack) 01/2014    Medications:  No prior anticoagulation noted   Assessment: 72 year old male with history of hypertension, hyperlipidemia, depression, TIA, R BKA who presents to the emergency department for chief concerns of left foot pain and redness with swelling. Pt with history of prior lower extremity stents placed  02/2023. Pharmacy has been consulted to initiate and manage IV heparin therapy for lower extremity aterial occlusion   *Patient in need of toe amputation. Plan angio for 05/20/2023 and possible amputation on Saturday*  Goal of Therapy:  Heparin level 0.3-0.7 units/ml Monitor platelets by anticoagulation protocol: Yes   06/12 2200 HL 0.30, therapeutic x 1 06/13 0606 HL 0.21, subtherapeutic @ 1100 u/hr 06/13 1648 HL 0.37 therapeutic x 1 @ 1200 units/hr 06/13 2317 HL 0.33 therapeutic x 2 06/14 0710 HL 0.26, sub-therapeutic 06/14 2258 HL 0.48, therapeutic x 1 06/15 0657 HL 0.32, therapeutic x1  Plan:  s/p procedure on 05/20/23 Continue heparin infusion rate of 1300 units/hr without bolus per vascular plans For toes amputation on 6/15 Recheck HL with am labs Daily CBC while on heparin  Bari Mantis PharmD Clinical Pharmacist 05/21/2023

## 2023-05-21 NOTE — Progress Notes (Signed)
OT Cancellation Note  Patient Details Name: Jordan Arellano MRN: [id number] DOB: 1951/12/05   Cancelled Treatment:    Reason Eval/Treat Not Completed: Medical issues which prohibited therapy (Pt t/f to the ICU for hypotension. Will sign off until medically stable. Please re-consult when pt has stabilized. Also, pt pending amputation.)  Alvester Morin 05/21/2023, 7:46 AM

## 2023-05-21 NOTE — Progress Notes (Signed)
PT Cancellation Note  Patient Details Name: Nussen Wingerter MRN: [id number] DOB: [birth year]   Cancelled Treatment:    Reason Eval/Treat Not Completed: Patient not medically ready.  PT consult received.  Chart reviewed.  Pt transferred to ICU this morning (6/15) for persistent hypotension requiring vasopressors.  Pt also pending surgery.  D/t pt transferring to higher level of care, per PT protocol require new PT consult in order to continue therapy (will discontinue current PT order d/t this).  Please re-consult PT when pt is medically appropriate to participate in PT.  Hendricks Limes, PT 05/21/23, 7:59 AM

## 2023-05-21 NOTE — Interval H&P Note (Signed)
History and Physical Interval Note:  05/21/2023 8:28 AM  Jordan Arellano  has presented today for surgery, with the diagnosis of Osteomyelitis.  The various methods of treatment have been discussed with the patient and family. After consideration of risks, benefits and other options for treatment, the patient has consented to  Procedure(s): AMPUTATION LEFT 1st & 3rd TOES (Left) as a surgical intervention.  The patient's history has been reviewed, patient examined, no change in status, stable for surgery.  I have reviewed the patient's chart and labs.  Questions were answered to the patient's satisfaction.     Ricci Barker

## 2023-05-21 NOTE — Progress Notes (Signed)
eLink Physician-Brief Progress Note Patient Name: Josiyah Henao DOB: 1951/03/06 MRN: [id number]   Date of Service  05/21/2023  HPI/Events of Note  Patient admitted for infected left foot with sepsis and shock, he was transferred to the unit for vasopressor support.  eICU Interventions  New Patient Evaluation.        Thomasene Lot Tavyn Kurka 05/21/2023, 6:19 AM

## 2023-05-21 NOTE — Progress Notes (Signed)
Pharmacy Antibiotic Note  Jordan Arellano is a 72 y.o. 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  Pharmacy has been consulted for vancomycin and cefepime dosing. Patient presented with left foot pain and redness with swelling. On presentation the patient stated having a left foot wound lasting 3-4 days. Patient has a history of PAD with previous revascularization. WBC have improved since starting ABX.  6/12 - Scr 1.24 - near baseline. Xray of foot: "Erosions are seen in the tip of distal phalanx of left big toe" - MRI has been ordered. 6/13 - Scr 1.11 - MRI showed a small ulceration at the distal aspect of the great toe with acute osteomyelitis of the great toe distal phalanx and acute osteomyelitis of the distal phalanx of the third toe. 6/14: Angio today with possible amputation on Saturday, renal function stable 6/15 Scr increase 1.12>>1.53.  toe amputations today  Plan: Day #4 -Scr increase. Will change to Vancomycin variable dosing. Check random vancomycin level with am labs 6/16 Last Vancomycin dose of 1250 mg given 6/15@0021   -Continue cefepime 2 grams every 12 hours (CrCl 30-60 mL/min)  F/U renal fxn, cultures  Height: 6\' 1"  (185.4 cm) Weight: 64.4 kg (141 lb 15.6 oz) IBW/kg (Calculated) : 79.9  Temp (24hrs), Avg:99 F (37.2 C), Min:97.7 F (36.5 C), Max:100.7 F (38.2 C)  Recent Labs  Lab 05/18/23 1148 05/19/23 0606 05/20/23 0710 05/20/23 2152 05/20/23 2258 05/20/23 2353 05/21/23 0524  WBC 11.4* 10.0 8.8  --  8.9  --  15.5*  CREATININE 1.24 1.11 1.12  --   --   --  1.53*  LATICACIDVEN  --   --   --  2.2*  --  1.6  --      Estimated Creatinine Clearance: 40.9 mL/min (A) (by C-G formula based on SCr of 1.53 mg/dL (H)).    No Known Allergies  Antimicrobials this admission: Ceftriaxone x 1  Vancomycin 6/12 >>  Cefepime 6/12 >>  Dose adjustments this admission: Vanocmycin 1000 mg Q24h >> 1250 mg Q24h>> variable dosing  6/15  Microbiology results: Bone cx 6/15: pending  Thank you for allowing pharmacy to be a part of this patient's care.  Bari Mantis PharmD Clinical Pharmacist 05/21/2023

## 2023-05-21 NOTE — Progress Notes (Signed)
[facility] [place] PROGRESS NOTE  ASSESSMENT / PLAN: Jordan Arellano is a 72 y.o. 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  He does not yet have inline flow to heal his left lower extremity wounds.  Drs. Do and Schnier are planning definitive intervention for next week.  No changes to care plan from my perspective.  SUBJECTIVE: No complaints.  Reviewed angiogram findings with patient.  OBJECTIVE: BP (!) 100/50   Pulse 77   Temp 98.5 F (36.9 C) (Oral)   Resp 17   Ht 6\' 1"  (1.854 m)   Wt 64.4 kg   SpO2 96%   BMI 18.73 kg/m   Intake/Output Summary (Last 24 hours) at 05/21/2023 1257 Last data filed at 05/21/2023 1246 Gross per 24 hour  Intake 4648.7 ml  Output 1000 ml  Net 3648.7 ml    Chronically ill-appearing gentleman in no acute distress Regular rate and rhythm Unlabored breathing No palpable pedal pulses on the left Right lower extremity BKA     Latest Ref Rng & Units 05/21/2023    5:24 AM 05/20/2023   10:58 PM 05/20/2023    7:10 AM  CBC  WBC 4.0 - 10.5 K/uL 15.5  8.9  8.8   Hemoglobin 13.0 - 17.0 g/dL 8.8  16.1  09.6   Hematocrit 39.0 - 52.0 % 27.0  31.7  31.3   Platelets 150 - 400 K/uL 208  218  231         Latest Ref Rng & Units 05/21/2023    5:24 AM 05/20/2023    7:10 AM 05/19/2023    6:06 AM  CMP  Glucose 70 - 99 mg/dL 045  409  811   BUN 8 - 23 mg/dL 27  25  25    Creatinine 0.61 - 1.24 mg/dL 9.14  7.82  9.56   Sodium 135 - 145 mmol/L 138  140  139   Potassium 3.5 - 5.1 mmol/L 3.5  4.3  4.5   Chloride 98 - 111 mmol/L 111  111  111   CO2 22 - 32 mmol/L 17  20  20    Calcium 8.9 - 10.3 mg/dL 8.7  8.9  9.1     Estimated Creatinine Clearance: 40.9 mL/min (A) (by C-G formula based on SCr of 1.53 mg/dL (H)).  Rande Brunt. Lenell Antu, MD [facility] Phone Number: [phone] 05/21/2023 12:57  PM

## 2023-05-21 NOTE — Consult Note (Addendum)
NAME:  Jordan Arellano, MRN:  [id number], DOB:  07-Apr-1951, LOS: 3 ADMISSION DATE:  05/18/2023, CONSULTATION DATE:  05/21/23 REFERRING MD:  Manuela Schwartz  REASON FOR CONSULT: Hypotension   HPI  72 y.o  male with significant PMH of basal cell carcinoma, anxiety and depression, PAD, s/p right BKA, CVA, hypertension, and hyperlipidemia who presented to the ED with chief complaints of left foot pain and redness with swelling.    ED Course: Initial vital signs showed HR of 78 beats/minute, BP 105/69 mm Hg, the RR 18 breaths/minute, and the oxygen saturation 97 % on  RAand a temperature of 98.7F (36.8C).   Pertinent Labs/Diagnostics Findings: Na+/ K+: 141/4.8 Glucose: 122 BUN/Cr.:27/1.24  WBC:11.4 Hgb/Hct: 10.6/33.5  Lactic acid: 2.2  ED Treatment: Heparin bolus and GGT, ceftriaxone 2 g IV one-time dose. EDP discussed with vascular who recommends hospital admission for vascular intervention.   Hospital Course: Patient admitted to hospitalist service with vascular and Podiatry consult.  See significant events below:  Past Medical History   Basal cell carcinoma 08/11/2022   R forearm - ED&C  Depression    Foot ulcer (HCC) 12/12/2016  Hyperlipidemia    Hypertension    PAD (peripheral artery disease) (HCC) ~2007  s/p R BKA for non-healing wound  Stroke (HCC) 03/2014  MRI: Acute nonhemorrhagic left paracentral pontine infarct. 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  TIA (transient ischemic attack)    Significant Hospital Events   6/12: Admitted to hospitalist service with acute osteomyelitis of the left foot.  Vascular consulted for PAD with ulceration of LLE. 6/13: Patient evaluated by podiatry with plans for amputation of left first and third toes 6/14: S/p left lower extremity angiography with stent placement to the right: Iliac artery and right external iliac artery 6/15: Transferred to ICU for persistent  hypotension despite IV fluids requiring vasopressors.  PCCM consulted  Consults:  Podiatry Vascular PCCM  Procedures:  6/14: Left lower extremity angiography for PAD with ischemic LLE  Significant Diagnostic Tests:  6/12: Left foot x-ray> IMPRESSION: No recent fracture or dislocation is seen. Erosions are seen in the tip of distal phalanx of left big toe. If there is clinical suspicion for osteomyelitis, follow-up MRI may be considered.  6/12: [place] LLE> Negative for DVT  6/13:MRI  Left Foot> MPRESSION: 1. Small ulceration at the distal aspect of the great toe with acute osteomyelitis of the great toe distal phalanx. 2. Acute osteomyelitis of the distal phalanx of the third toe. 3. Trace joint fluid within the great toe IP joint, nonspecific and may be reactive. Septic arthritis not excluded.  Interim History / Subjective:  -Remains on low-dose pressors  Micro Data:  6/14: Blood culture x2>  Antimicrobials:  Ceftriaxone x 1  Vancomycin 6/12 >>  Cefepime 6/12 >>  OBJECTIVE  Blood pressure (!) 77/43, pulse 91, temperature 99.5 F (37.5 C), resp. rate 19, height 6\' 1"  (1.854 m), weight 64.4 kg, SpO2 95 %.        Intake/Output Summary (Last 24 hours) at 05/21/2023 0549 Last data filed at 05/21/2023 0440 Gross per 24 hour  Intake 3778.8 ml  Output 1050 ml  Net 2728.8 ml   Filed Weights   05/18/23 1138 05/19/23 2354 05/20/23 1444  Weight: 64.4 kg 64.4 kg 64.4 kg   Physical Examination  GENERAL: 72 year-old critically ill patient lying in the bed in no acute distress EYES: PEERLA. No scleral icterus. Extraocular muscles intact.  HEENT: Head atraumatic, normocephalic. Oropharynx and nasopharynx clear.  NECK:  No JVD, supple  LUNGS: Normal breath sounds bilaterally.  No use of accessory muscles of respiration.  CARDIOVASCULAR: S1, S2 normal. No murmurs, rubs, or gallops.  ABDOMEN: Soft, NTND EXTREMITIES: Mild to moderate swelling or erythema of left foot.  Capillary  refill is less than 3 seconds in all extremities. Pulses palpable distally on the left.  Right BKA NEUROLOGIC: The patient is alert and oriented x 4.  No focal neurological deficit cranial nerves are intact.  SKIN: No obvious rash, lesion, or ulcer. Warm to touch Labs/imaging that I havepersonally reviewed  (right click and "Reselect all SmartList Selections" daily)     Labs   CBC: Recent Labs  Lab 05/18/23 1148 05/19/23 0606 05/20/23 0710 05/20/23 2258  WBC 11.4* 10.0 8.8 8.9  NEUTROABS 7.4  --   --  8.4*  HGB 10.6* 9.6* 10.0* 10.1*  HCT 33.5* 30.4* 31.3* 31.7*  MCV 93.8 93.3 92.9 93.8  PLT 274 237 231 218    Basic Metabolic Panel: Recent Labs  Lab 05/18/23 1148 05/19/23 0606 05/20/23 0710  NA 141 139 140  K 4.8 4.5 4.3  CL 112* 111 111  CO2 19* 20* 20*  GLUCOSE 122* 128* 118*  BUN 27* 25* 25*  CREATININE 1.24 1.11 1.12  CALCIUM 8.0* 9.1 8.9  MG  --   --  2.0   GFR: Estimated Creatinine Clearance: 55.9 mL/min (by C-G formula based on SCr of 1.12 mg/dL). Recent Labs  Lab 05/18/23 1148 05/19/23 0606 05/20/23 0710 05/20/23 2152 05/20/23 2258 05/20/23 2353  WBC 11.4* 10.0 8.8  --  8.9  --   LATICACIDVEN  --   --   --  2.2*  --  1.6    Liver Function Tests: Recent Labs  Lab 05/18/23 1148  AST 19  ALT 11  ALKPHOS 68  BILITOT 0.1*  PROT 7.6  ALBUMIN 3.8   No results for input(s): "LIPASE", "AMYLASE" in the last 168 hours. No results for input(s): "AMMONIA" in the last 168 hours.  ABG    Component Value Date/Time   TCO2 23 09/30/2017 1836     Coagulation Profile: Recent Labs  Lab 05/18/23 1521  INR 1.1    Cardiac Enzymes: No results for input(s): "CKTOTAL", "CKMB", "CKMBINDEX", "TROPONINI" in the last 168 hours.  HbA1C: Hemoglobin A1C  Date/Time Value Ref Range Status  02/03/2016 12:00 AM 7.3  Final  02/03/2016 12:00 AM 7.3  Final   Hgb A1c MFr Bld  Date/Time Value Ref Range Status  01/24/2023 08:50 AM 6.2 4.6 - 6.5 % Final     Comment:    Glycemic Control Guidelines for People with Diabetes:Non Diabetic:  <6%Goal of Therapy: <7%Additional Action Suggested:  >8%   07/21/2022 12:09 PM 6.4 4.6 - 6.5 % Final    Comment:    Glycemic Control Guidelines for People with Diabetes:Non Diabetic:  <6%Goal of Therapy: <7%Additional Action Suggested:  >8%     CBG: Recent Labs  Lab 05/20/23 1307 05/20/23 1456 05/20/23 1711 05/20/23 2058 05/21/23 0454  GLUCAP 125* 108* 102* 107* 115*   Past Medical History  He,  has a past medical history of Basal cell carcinoma (08/11/2022), Depression, Foot ulcer (HCC) (12/12/2016), Hyperlipidemia, Hypertension, PAD (peripheral artery disease) (HCC) (~2007), Stroke (HCC) (03/2014), and TIA (transient ischemic attack) (01/2014).   Surgical History    Past Surgical History:  Procedure Laterality Date   BELOW KNEE LEG AMPUTATION Right    FEMORAL-POPLITEAL BYPASS  GRAFT Left 12/17/2016   Procedure: BYPASS LEFT FEMORAL TO BELOW POPLITEAL ARTERY USING PROPATEN GORE GRAFT;  Surgeon: Larina Earthly, MD;  Location: [facility] OR;  Service: Vascular;  Laterality: Left;   FEMORAL-POPLITEAL BYPASS GRAFT Left 09/30/2017   Procedure: LEFT LEG ANGIOGRAM,  THROMBECTOMY, FEM-POPLITEAL BYPASS GRAFT, tHROMBECTOMY PERONEAL ARTERY AND POSTERIOR TIBIAL , ENDARTERECTOMY TIBIAL/PERONEAL TRUNK WITH BOVINE PATCH ANGIOPLASTY.;  Surgeon: Fransisco Hertz, MD;  Location: [facility] OR;  Service: Vascular;  Laterality: Left;   INTRAMEDULLARY (IM) NAIL INTERTROCHANTERIC Right 03/21/2023   Procedure: INTRAMEDULLARY (IM) NAIL INTERTROCHANTERIC;  Surgeon: Deeann Saint, MD;  Location: [facility] ORS;  Service: Orthopedics;  Laterality: Right;   LOWER EXTREMITY ANGIOGRAPHY Left 12/14/2022   Procedure: Lower Extremity Angiography;  Surgeon: Renford Dills, MD;  Location: [facility] INVASIVE CV LAB;  Service: Cardiovascular;  Laterality: Left;   PERIPHERAL VASCULAR CATHETERIZATION Left 12/14/2016   Procedure: Abdominal Aortogram w/Lower Extremity;   Surgeon: Chuck Hint, MD;  Location: [facility] INVASIVE CV LAB;  Service: Cardiovascular;  Laterality: Left;   right cataract extraction     TRANSTHORACIC ECHOCARDIOGRAM  01/2014   To evaluate possible CVA: EF 55-60%. GR 1 DD. No significant valvular lesions     Social History   reports that he has been smoking cigarettes. He has a 900.00 pack-year smoking history. He has never used smokeless tobacco. He reports that he does not drink alcohol and does not use drugs.   Family History   His family history includes Diabetes in his mother and sister; Heart disease in his brother, father, and mother; Hypertension in his brother, father, mother, and sister; Stroke in his father and mother.   Allergies No Known Allergies   Home Medications  Prior to Admission medications   Medication Sig Start Date End Date Taking? Authorizing Provider  amLODipine (NORVASC) 10 MG tablet Take 10 mg by mouth daily. 02/28/23  Yes [provider]  aspirin 81 MG tablet Take 81 mg by mouth daily.   Yes [provider]  clopidogrel (PLAVIX) 75 MG tablet Take 1 tablet (75 mg total) by mouth daily. 12/14/22  Yes Schnier, Latina Craver, MD  cyanocobalamin 1000 MCG tablet Take 1 tablet (1,000 mcg total) by mouth daily. 03/31/23 06/29/23 Yes Gillis Santa, MD  ergocalciferol (VITAMIN D2) 1.25 MG (50000 UT) capsule Take 1 capsule by mouth once a week. 04/11/23  Yes [provider]  icosapent Ethyl (VASCEPA) 1 g capsule Take 2 capsules (2 g total) by mouth 2 (two) times daily. 01/27/23 07/26/23 Yes Dugal, Tabitha, FNP  latanoprost (XALATAN) 0.005 % ophthalmic solution Place 1 drop into both eyes at bedtime. 11/12/22  Yes [provider]  metFORMIN (GLUCOPHAGE) 850 MG tablet Take 1 tablet (850 mg total) by mouth 2 (two) times daily with a meal. 05/09/23 05/03/24 Yes Dugal, Tabitha, FNP  olmesartan (BENICAR) 40 MG tablet Take 1 tablet (40 mg total) by mouth daily. 05/09/23  Yes Dugal, Wyatt Mage, FNP   rosuvastatin (CRESTOR) 40 MG tablet Take 1 tablet (40 mg total) by mouth daily. 01/31/23  Yes Dugal, Wyatt Mage, FNP  sertraline (ZOLOFT) 100 MG tablet Take 1 tablet (100 mg total) by mouth daily. 05/09/23  Yes Mort Sawyers, FNP  spironolactone (ALDACTONE) 25 MG tablet Take 1 tablet (25 mg total) by mouth daily. 05/09/23  Yes Dugal, Wyatt Mage, FNP  acetaminophen (TYLENOL) 500 MG tablet Take 500 mg by mouth every 6 (six) hours as needed. Patient not taking: Reported on 05/19/2023    [provider]  ascorbic acid (VITAMIN C)  500 MG tablet Take 1 tablet (500 mg total) by mouth daily. Patient not taking: Reported on 05/19/2023 03/26/23 06/24/23  Gillis Santa, MD  bisacodyl 5 MG EC tablet Take 2 tablets (10 mg total) by mouth daily as needed for moderate constipation. Patient not taking: Reported on 05/19/2023 03/25/23   Gillis Santa, MD  blood glucose meter kit and supplies KIT Dispense based on patient and insurance preference. Use two times daily as directed (before breakfast and at bedtime. (FOR ICD-10: E11.51) 02/11/22   Nche, Bonna Gains, NP  ferrous sulfate 325 (65 FE) MG tablet Take 1 tablet (325 mg total) by mouth daily with breakfast. Patient not taking: Reported on 05/19/2023 03/26/23 06/24/23  Gillis Santa, MD  folic acid (FOLVITE) 1 MG tablet Take 1 tablet (1 mg total) by mouth daily. Patient not taking: Reported on 05/19/2023 03/26/23 06/24/23  Gillis Santa, MD  HYDROcodone-acetaminophen (NORCO/VICODIN) 5-325 MG tablet Take 1 tablet by mouth every 8 (eight) hours as needed for moderate pain. Patient not taking: Reported on 05/19/2023 03/31/23   Lurene Shadow, MD  polyethylene glycol (MIRALAX) 17 g packet Take 17 g by mouth daily. Patient not taking: Reported on 05/19/2023 03/25/23   Gillis Santa, MD  Scheduled Meds:  aspirin EC  81 mg Oral Daily   cyanocobalamin  1,000 mcg Oral Daily   folic acid  1 mg Oral Daily   insulin aspart  0-15 Units Subcutaneous TID WC   insulin aspart  0-5 Units  Subcutaneous QHS   polyethylene glycol  17 g Oral Daily   rosuvastatin  40 mg Oral QHS   sertraline  100 mg Oral Daily   Continuous Infusions:  sodium chloride 250 mL (05/21/23 0516)   ceFEPime (MAXIPIME) IV 2 g (05/21/23 0523)   heparin 1,300 Units/hr (05/21/23 0439)   lactated ringers 100 mL/hr at 05/21/23 0422   norepinephrine (LEVOPHED) Adult infusion 2 mcg/min (05/21/23 0517)   vancomycin 166.7 mL/hr at 05/21/23 0422   PRN Meds:.acetaminophen **OR** acetaminophen, bisacodyl, hydrALAZINE, nicotine, ondansetron **OR** ondansetron (ZOFRAN) IV, senna-docusate  Active Hospital Problem list   See below  Assessment & Plan:   #Sepsis with Shock Secondary to acute osteomyelitis of the left foot #? Septic arthritis seen on MRI Foot -Supplemental oxygen as needed, to maintain SpO2 > 90% -F/u cultures, trend lactic/ PCT -Monitor WBC/ fever curve -IV antibiotics: cefepime & vancomycin  -IVF hydration as needed -Pressors for MAP goal >65 -Strict I/O's  #Ischemic Limb of LLE with Ulceration #PAD status post right BKA #Tobacco abuse -s/p left lower extremity angioplasty with stent placement -Plan for left lower foot amputation -Continue heparin drip per nomogram -Smoking cessation -Appreciate input from vascular and podiatry  #HTN #HLD #Hx of CVA -Hold home BP meds due to hypotension Continue aspirin 81 mg and Plavix 75 mg Continue rosuvastatin 40 mg   #T2 Diabetes mellitus #Check Hemoglobin A1c -CBGs Q ACHS -Sliding scale insulin -Follow ICU hyper/hypoglycemia protocol -Hold home Metformin     Best practice:  Diet:  NPO Pain/Anxiety/Delirium protocol (if indicated): No VAP protocol (if indicated): Not indicated DVT prophylaxis: Systemic AC GI prophylaxis: N/A Glucose control:  SSI No Central venous access:  N/A Arterial line:  N/A Foley:  N/A Mobility:  bed rest  PT consulted: N/A Last date of multidisciplinary goals of care discussion [05/21/23] Code Status:   full code Disposition: ICU   = Goals of Care = Code Status Order: FULL  Primary Emergency ContactHalford Chessman, Home Phone: [phone] Wishes to pursue full aggressive treatment  and intervention options, including CPR and intubation, Critical care time: 45 minutes        Webb Silversmith DNP, CCRN, FNP-C, AGACNP-BC Acute Care & Family Nurse Practitioner [facility] PCCM on call pager [phone]

## 2023-05-21 NOTE — Progress Notes (Addendum)
       CROSS COVER NOTE  NAME: Jordan Arellano MRN: [id number] DOB : [birth year]    Concern as stated by nurse / staff    Pt came in yesterday with ishcemic left toe. Had angiogram today and is having left toe amputation tomorrow. BP is 82/51 (61)   and then 68/40(49). Only comlpaints right now are nausea. Giving zofran Other arm is 78/48(58) HR is 90s to 101  Temp 99   Pertinent findings on chart review: Patient admitted with osteomyelitis left foot (first and third toes) from PAD. Underwent aortagram 6/14 with stent to right common and right external iliac arteries, unable to revascularize left. Plan is for amputation first and third toes today. Has been on vanc and cefepime   Assessment and  Interventions   Assessment: Initial lactate 2.2 improved 1.6total fluids Initial fluid 30 ml/kg LR  2140 + additional 250 for persistent low pressure + 10 mg midodrine Blood cultures done Patient improved temp and heart rate, MAP still not at goal of 65 Patient states he does feel better. Denies pain. Groin sites without bleeding or hematoma.  SR Denies painful or difficulty with urination Endorses use of IS and flutter valve; NO cough sats stable  Nausea relieved after zofran  Plan: Blood cultures  Transfer to ICU - peripherl pressors - discussed with E Ouma NP with PCCM Vbg added to am labs Attempts to notify family Jordan Arellano, message left      [place] NP [facility] Cross Cover 7pm-7am - check amion for availability Pager [phone]

## 2023-05-22 ENCOUNTER — Encounter: Payer: Self-pay | Admitting: Podiatry

## 2023-05-22 LAB — CBC
HCT: 27.1 % — ABNORMAL LOW (ref 39.0–52.0)
Hemoglobin: 8.8 g/dL — ABNORMAL LOW (ref 13.0–17.0)
MCH: 29.9 pg (ref 26.0–34.0)
MCHC: 32.5 g/dL (ref 30.0–36.0)
MCV: 92.2 fL (ref 80.0–100.0)
Platelets: 190 10*3/uL (ref 150–400)
RBC: 2.94 MIL/uL — ABNORMAL LOW (ref 4.22–5.81)
RDW: 16.7 % — ABNORMAL HIGH (ref 11.5–15.5)
WBC: 14.7 10*3/uL — ABNORMAL HIGH (ref 4.0–10.5)
nRBC: 0 % (ref 0.0–0.2)

## 2023-05-22 LAB — GLUCOSE, CAPILLARY
Glucose-Capillary: 154 mg/dL — ABNORMAL HIGH (ref 70–99)
Glucose-Capillary: 118 mg/dL — ABNORMAL HIGH (ref 70–99)
Glucose-Capillary: 108 mg/dL — ABNORMAL HIGH (ref 70–99)
Glucose-Capillary: 115 mg/dL — ABNORMAL HIGH (ref 70–99)

## 2023-05-22 LAB — BASIC METABOLIC PANEL
Anion gap: 7 (ref 5–15)
BUN: 28 mg/dL — ABNORMAL HIGH (ref 8–23)
CO2: 20 mmol/L — ABNORMAL LOW (ref 22–32)
Calcium: 8.5 mg/dL — ABNORMAL LOW (ref 8.9–10.3)
Chloride: 108 mmol/L (ref 98–111)
Creatinine, Ser: 1.39 mg/dL — ABNORMAL HIGH (ref 0.61–1.24)
GFR, Estimated: 55 mL/min — ABNORMAL LOW (ref >60)
Glucose, Bld: 141 mg/dL — ABNORMAL HIGH (ref 70–99)
Potassium: 3.3 mmol/L — ABNORMAL LOW (ref 3.5–5.1)
Sodium: 135 mmol/L (ref 135–145)

## 2023-05-22 LAB — CREATININE, SERUM
Creatinine, Ser: 1.41 mg/dL — ABNORMAL HIGH (ref 0.61–1.24)
GFR, Estimated: 54 mL/min — ABNORMAL LOW (ref >60)

## 2023-05-22 LAB — HEPARIN LEVEL (UNFRACTIONATED)
Heparin Unfractionated: 0.15 IU/mL — ABNORMAL LOW (ref 0.30–0.70)
Heparin Unfractionated: 0.19 IU/mL — ABNORMAL LOW (ref 0.30–0.70)

## 2023-05-22 LAB — VANCOMYCIN, RANDOM: Vancomycin Rm: 14 ug/mL

## 2023-05-22 LAB — CULTURE, BLOOD (ROUTINE X 2): Culture: NO GROWTH

## 2023-05-22 MED ORDER — VANCOMYCIN HCL IN DEXTROSE 1-5 GM/200ML-% IV SOLN
1000.0000 mg | INTRAVENOUS | Status: AC
  Administered 2023-05-22 – 2023-05-26 (×5): 1000 mg via INTRAVENOUS
  Filled 2023-05-22 (×6): qty 200

## 2023-05-22 MED ORDER — POTASSIUM CHLORIDE CRYS ER 20 MEQ PO TBCR
40.0000 meq | EXTENDED_RELEASE_TABLET | Freq: Once | ORAL | Status: AC
  Administered 2023-05-22: 40 meq via ORAL
  Filled 2023-05-22: qty 2

## 2023-05-22 MED ORDER — MORPHINE SULFATE (PF) 2 MG/ML IV SOLN
2.0000 mg | Freq: Once | INTRAVENOUS | Status: AC
  Administered 2023-05-22: 2 mg via INTRAVENOUS

## 2023-05-22 NOTE — Progress Notes (Signed)
Pharmacy Antibiotic Note  Jordan Arellano is a 72 y.o. 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  Pharmacy has been consulted for vancomycin and cefepime dosing. Patient presented with left foot pain and redness with swelling. On presentation the patient stated having a left foot wound lasting 3-4 days. Patient has a history of PAD with previous revascularization. WBC have improved since starting ABX.  6/12 - Scr 1.24 - near baseline. Xray of foot: "Erosions are seen in the tip of distal phalanx of left big toe" - MRI has been ordered. 6/13 - Scr 1.11 - MRI showed a small ulceration at the distal aspect of the great toe with acute osteomyelitis of the great toe distal phalanx and acute osteomyelitis of the distal phalanx of the third toe. 6/14: Angio today with possible amputation on Saturday, renal function stable 6/15 Scr increase 1.12>>1.53.  s/p toe amputations today 6/16 Scr 1.53 >> 1.41  Bone cx: rare GPC, GPR Random Vanc level 6/16@0420 =14 mcg/ml  Plan: Day #4 -Will order Vancomycin 1000 mg IV q24h Goal AUC 400-550. Expected AUC: 522 SCr used: 1.41 Cmin 13.3 TBW<IBW   -on cefepime 2 grams every 12 hours (CrCl 30-60 mL/min)  F/U renal fxn, cultures  Height: 6\' 1"  (185.4 cm) Weight: 64.4 kg (141 lb 15.6 oz) IBW/kg (Calculated) : 79.9  Temp (24hrs), Avg:98.7 F (37.1 C), Min:97.9 F (36.6 C), Max:100.7 F (38.2 C)  Recent Labs  Lab 05/18/23 1148 05/19/23 0606 05/20/23 0710 05/20/23 2152 05/20/23 2258 05/20/23 2353 05/21/23 0524 05/22/23 0420  WBC 11.4* 10.0 8.8  --  8.9  --  15.5* 14.7*  CREATININE 1.24 1.11 1.12  --   --   --  1.53* 1.39*  1.41*  LATICACIDVEN  --   --   --  2.2*  --  1.6  --   --   VANCORANDOM  --   --   --   --   --   --   --  14     Estimated Creatinine Clearance: 44.4 mL/min (A) (by C-G formula based on SCr of 1.41 mg/dL (H)).    No Known Allergies  Antimicrobials this admission: Ceftriaxone x 1   Vancomycin 6/12 >>  Cefepime 6/12 >>  Dose adjustments this admission: Vanocmycin 1000 mg Q24h >> 1250 mg Q24h>> variable dosing 6/15 >> 1000mg  q24h 6/16  Microbiology results: Bone cx 6/15: GPC, GPR  Thank you for allowing pharmacy to be a part of this patient's care.  Bari Mantis PharmD Clinical Pharmacist 05/22/2023

## 2023-05-22 NOTE — Progress Notes (Signed)
[facility] [place] PROGRESS NOTE  ASSESSMENT / PLAN: Jordan Arellano is a 72 y.o. 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  He does not yet have inline flow to heal his left lower extremity wounds.  Drs. Dew and Schnier are planning definitive intervention for next week.  No changes to care plan from my perspective.  SUBJECTIVE: No complaints.  Reviewed angiogram findings with patient.  OBJECTIVE: BP (!) 104/53   Pulse 84   Temp 98.7 F (37.1 C) (Axillary)   Resp 16   Ht 6\' 1"  (1.854 m)   Wt 64.4 kg   SpO2 93%   BMI 18.73 kg/m   Intake/Output Summary (Last 24 hours) at 05/22/2023 0754 Last data filed at 05/22/2023 0732 Gross per 24 hour  Intake 3157.77 ml  Output 1325 ml  Net 1832.77 ml     Chronically ill-appearing gentleman in no acute distress Regular rate and rhythm Unlabored breathing No palpable pedal pulses on the left Right lower extremity BKA     Latest Ref Rng & Units 05/22/2023    4:20 AM 05/21/2023    5:24 AM 05/20/2023   10:58 PM  CBC  WBC 4.0 - 10.5 K/uL 14.7  15.5  8.9   Hemoglobin 13.0 - 17.0 g/dL 8.8  8.8  16.1   Hematocrit 39.0 - 52.0 % 27.1  27.0  31.7   Platelets 150 - 400 K/uL 190  208  218         Latest Ref Rng & Units 05/22/2023    4:20 AM 05/21/2023    5:24 AM 05/20/2023    7:10 AM  CMP  Glucose 70 - 99 mg/dL 096  045  409   BUN 8 - 23 mg/dL 28  27  25    Creatinine 0.61 - 1.24 mg/dL 8.11 - 9.14 mg/dL 7.82    9.56  2.13  0.86   Sodium 135 - 145 mmol/L 135  138  140   Potassium 3.5 - 5.1 mmol/L 3.3  3.5  4.3   Chloride 98 - 111 mmol/L 108  111  111   CO2 22 - 32 mmol/L 20  17  20    Calcium 8.9 - 10.3 mg/dL 8.5  8.7  8.9     Estimated Creatinine Clearance: 44.4 mL/min (A) (by C-G formula based on SCr of 1.41 mg/dL (H)).  Rande Brunt. Lenell Antu, MD [facility] [place] [facility] Phone Number:  [phone] 05/22/2023 7:54 AM

## 2023-05-22 NOTE — Progress Notes (Signed)
NAME:  Jordan Arellano, MRN:  [id number], DOB:  [birth year], LOS: 4 ADMISSION DATE:  05/18/2023, CONSULTATION DATE:  05/21/23 REFERRING MD:  Manuela Schwartz  REASON FOR CONSULT: Hypotension   HPI  72 y.o  male with significant PMH of basal cell carcinoma, anxiety and depression, PAD, s/p right BKA, CVA, hypertension, and hyperlipidemia who presented to the ED with chief complaints of left foot pain and redness with swelling.    ED Course: Initial vital signs showed HR of 78 beats/minute, BP 105/69 mm Hg, the RR 18 breaths/minute, and the oxygen saturation 97 % on  RAand a temperature of 98.23F (36.8C).   Pertinent Labs/Diagnostics Findings: Na+/ K+: 141/4.8 Glucose: 122 BUN/Cr.:27/1.24  WBC:11.4 Hgb/Hct: 10.6/33.5  Lactic acid: 2.2  ED Treatment: Heparin bolus and GGT, ceftriaxone 2 g IV one-time dose. EDP discussed with vascular who recommends hospital admission for vascular intervention.   Hospital Course: Patient admitted to hospitalist service with vascular and Podiatry consult.  See significant events below:  05/22/23- patient stable post operatively.  Has been weaned off vasopressors.  Does have + cultures from bone specimen.  Will optimize for TRH transfer.   Past Medical History   Basal cell carcinoma 08/11/2022   R forearm - ED&C  Depression    Foot ulcer (HCC) 12/12/2016  Hyperlipidemia    Hypertension    PAD (peripheral artery disease) (HCC) ~2007  s/p R BKA for non-healing wound  Stroke (HCC) 03/2014  MRI: Acute nonhemorrhagic left paracentral pontine infarct. 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  TIA (transient ischemic attack)    Significant Hospital Events   6/12: Admitted to hospitalist service with acute osteomyelitis of the left foot.  Vascular consulted for PAD with ulceration of LLE. 6/13: Patient evaluated by podiatry with plans for amputation of left first and third toes 6/14: S/p  left lower extremity angiography with stent placement to the right: Iliac artery and right external iliac artery 6/15: Transferred to ICU for persistent hypotension despite IV fluids requiring vasopressors.  PCCM consulted  Consults:  Podiatry Vascular PCCM  Procedures:  6/14: Left lower extremity angiography for PAD with ischemic LLE  Significant Diagnostic Tests:  6/12: Left foot x-ray> IMPRESSION: No recent fracture or dislocation is seen. Erosions are seen in the tip of distal phalanx of left big toe. If there is clinical suspicion for osteomyelitis, follow-up MRI may be considered.  6/12: [place] LLE> Negative for DVT  6/13:MRI  Left Foot> MPRESSION: 1. Small ulceration at the distal aspect of the great toe with acute osteomyelitis of the great toe distal phalanx. 2. Acute osteomyelitis of the distal phalanx of the third toe. 3. Trace joint fluid within the great toe IP joint, nonspecific and may be reactive. Septic arthritis not excluded.  Interim History / Subjective:  -Remains on low-dose pressors  Micro Data:  6/14: Blood culture x2>  Antimicrobials:  Ceftriaxone x 1  Vancomycin 6/12 >>  Cefepime 6/12 >>  OBJECTIVE  Blood pressure (!) 104/53, pulse 84, temperature 98.7 F (37.1 C), temperature source Axillary, resp. rate 16, height 6\' 1"  (1.854 m), weight 64.4 kg, SpO2 93 %.        Intake/Output Summary (Last 24 hours) at 05/22/2023 0841 Last data filed at 05/22/2023 0732 Gross per 24 hour  Intake 3157.77 ml  Output 1250 ml  Net 1907.77 ml    Filed Weights   05/18/23 1138 05/19/23 2354 05/20/23 1444  Weight: 64.4 kg 64.4  kg 64.4 kg   Physical Examination  GENERAL: 72 year-old critically ill patient lying in the bed in no acute distress EYES: PEERLA. No scleral icterus. Extraocular muscles intact.  HEENT: Head atraumatic, normocephalic. Oropharynx and nasopharynx clear.  NECK:  No JVD, supple  LUNGS: Normal breath sounds bilaterally.  No use of accessory  muscles of respiration.  CARDIOVASCULAR: S1, S2 normal. No murmurs, rubs, or gallops.  ABDOMEN: Soft, NTND EXTREMITIES: Mild to moderate swelling or erythema of left foot.  Capillary refill is less than 3 seconds in all extremities. Pulses palpable distally on the left.  Right BKA NEUROLOGIC: The patient is alert and oriented x 4.  No focal neurological deficit cranial nerves are intact.  SKIN: No obvious rash, lesion, or ulcer. Warm to touch Labs/imaging that I havepersonally reviewed  (right click and "Reselect all SmartList Selections" daily)     Labs   CBC: Recent Labs  Lab 05/18/23 1148 05/19/23 0606 05/20/23 0710 05/20/23 2258 05/21/23 0524 05/22/23 0420  WBC 11.4* 10.0 8.8 8.9 15.5* 14.7*  NEUTROABS 7.4  --   --  8.4*  --   --   HGB 10.6* 9.6* 10.0* 10.1* 8.8* 8.8*  HCT 33.5* 30.4* 31.3* 31.7* 27.0* 27.1*  MCV 93.8 93.3 92.9 93.8 93.1 92.2  PLT 274 237 231 218 208 190     Basic Metabolic Panel: Recent Labs  Lab 05/18/23 1148 05/19/23 0606 05/20/23 0710 05/21/23 0524 05/22/23 0420  NA 141 139 140 138 135  K 4.8 4.5 4.3 3.5 3.3*  CL 112* 111 111 111 108  CO2 19* 20* 20* 17* 20*  GLUCOSE 122* 128* 118* 125* 141*  BUN 27* 25* 25* 27* 28*  CREATININE 1.24 1.11 1.12 1.53* 1.39*  1.41*  CALCIUM 8.0* 9.1 8.9 8.7* 8.5*  MG  --   --  2.0 1.3*  --     GFR: Estimated Creatinine Clearance: 44.4 mL/min (A) (by C-G formula based on SCr of 1.41 mg/dL (H)). Recent Labs  Lab 05/20/23 0710 05/20/23 2152 05/20/23 2258 05/20/23 2353 05/21/23 0524 05/22/23 0420  WBC 8.8  --  8.9  --  15.5* 14.7*  LATICACIDVEN  --  2.2*  --  1.6  --   --      Liver Function Tests: Recent Labs  Lab 05/18/23 1148  AST 19  ALT 11  ALKPHOS 68  BILITOT 0.1*  PROT 7.6  ALBUMIN 3.8    No results for input(s): "LIPASE", "AMYLASE" in the last 168 hours. No results for input(s): "AMMONIA" in the last 168 hours.  ABG    Component Value Date/Time   HCO3 18.5 (L) 05/21/2023 0524    TCO2 23 09/30/2017 1836   ACIDBASEDEF 6.6 (H) 05/21/2023 0524   O2SAT 72.4 05/21/2023 0524     Coagulation Profile: Recent Labs  Lab 05/18/23 1521  INR 1.1     Cardiac Enzymes: No results for input(s): "CKTOTAL", "CKMB", "CKMBINDEX", "TROPONINI" in the last 168 hours.  HbA1C: Hemoglobin A1C  Date/Time Value Ref Range Status  02/03/2016 12:00 AM 7.3  Final  02/03/2016 12:00 AM 7.3  Final   Hgb A1c MFr Bld  Date/Time Value Ref Range Status  01/24/2023 08:50 AM 6.2 4.6 - 6.5 % Final    Comment:    Glycemic Control Guidelines for People with Diabetes:Non Diabetic:  <6%Goal of Therapy: <7%Additional Action Suggested:  >8%   07/21/2022 12:09 PM 6.4 4.6 - 6.5 % Final    Comment:    Glycemic Control Guidelines for People  with Diabetes:Non Diabetic:  <6%Goal of Therapy: <7%Additional Action Suggested:  >8%     CBG: Recent Labs  Lab 05/21/23 0934 05/21/23 1117 05/21/23 1708 05/21/23 1936 05/22/23 0731  GLUCAP 120* 112* 94 130* 154*    Past Medical History  He,  has a past medical history of Basal cell carcinoma (08/11/2022), Depression, Foot ulcer (HCC) (12/12/2016), Hyperlipidemia, Hypertension, PAD (peripheral artery disease) (HCC) (~2007), Stroke (HCC) (03/2014), and TIA (transient ischemic attack) (01/2014).   Surgical History    Past Surgical History:  Procedure Laterality Date   AMPUTATION TOE Left 05/21/2023   Procedure: AMPUTATION LEFT 1st & 3rd TOES;  Surgeon: Linus Galas, DPM;  Location: [facility] ORS;  Service: Podiatry;  Laterality: Left;   BELOW KNEE LEG AMPUTATION Right    FEMORAL-POPLITEAL BYPASS GRAFT Left 12/17/2016   Procedure: BYPASS LEFT FEMORAL TO BELOW POPLITEAL ARTERY USING PROPATEN GORE GRAFT;  Surgeon: Larina Earthly, MD;  Location: [facility] OR;  Service: Vascular;  Laterality: Left;   FEMORAL-POPLITEAL BYPASS GRAFT Left 09/30/2017   Procedure: LEFT LEG ANGIOGRAM,  THROMBECTOMY, FEM-POPLITEAL BYPASS GRAFT, tHROMBECTOMY PERONEAL ARTERY AND POSTERIOR TIBIAL ,  ENDARTERECTOMY TIBIAL/PERONEAL TRUNK WITH BOVINE PATCH ANGIOPLASTY.;  Surgeon: Fransisco Hertz, MD;  Location: [facility] OR;  Service: Vascular;  Laterality: Left;   INTRAMEDULLARY (IM) NAIL INTERTROCHANTERIC Right 03/21/2023   Procedure: INTRAMEDULLARY (IM) NAIL INTERTROCHANTERIC;  Surgeon: Deeann Saint, MD;  Location: [facility] ORS;  Service: Orthopedics;  Laterality: Right;   LOWER EXTREMITY ANGIOGRAPHY Left 12/14/2022   Procedure: Lower Extremity Angiography;  Surgeon: Renford Dills, MD;  Location: [facility] INVASIVE CV LAB;  Service: Cardiovascular;  Laterality: Left;   PERIPHERAL VASCULAR CATHETERIZATION Left 12/14/2016   Procedure: Abdominal Aortogram w/Lower Extremity;  Surgeon: Chuck Hint, MD;  Location: [facility] INVASIVE CV LAB;  Service: Cardiovascular;  Laterality: Left;   right cataract extraction     TRANSTHORACIC ECHOCARDIOGRAM  01/2014   To evaluate possible CVA: EF 55-60%. GR 1 DD. No significant valvular lesions     Social History   reports that he has been smoking cigarettes. He has a 900.00 pack-year smoking history. He has never used smokeless tobacco. He reports that he does not drink alcohol and does not use drugs.   Family History   His family history includes Diabetes in his mother and sister; Heart disease in his brother, father, and mother; Hypertension in his brother, father, mother, and sister; Stroke in his father and mother.   Allergies No Known Allergies   Home Medications  Prior to Admission medications   Medication Sig Start Date End Date Taking? Authorizing Provider  amLODipine (NORVASC) 10 MG tablet Take 10 mg by mouth daily. 02/28/23  Yes [provider]  aspirin 81 MG tablet Take 81 mg by mouth daily.   Yes [provider]  clopidogrel (PLAVIX) 75 MG tablet Take 1 tablet (75 mg total) by mouth daily. 12/14/22  Yes Schnier, Latina Craver, MD  cyanocobalamin 1000 MCG tablet Take 1 tablet (1,000 mcg total) by mouth daily. 03/31/23 06/29/23 Yes Gillis Santa, MD  ergocalciferol (VITAMIN D2) 1.25 MG (50000 UT) capsule Take 1 capsule by mouth once a week. 04/11/23  Yes [provider]  icosapent Ethyl (VASCEPA) 1 g capsule Take 2 capsules (2 g total) by mouth 2 (two) times daily. 01/27/23 07/26/23 Yes Dugal, Tabitha, FNP  latanoprost (XALATAN) 0.005 % ophthalmic solution Place 1 drop into both eyes at bedtime. 11/12/22  Yes [provider]  metFORMIN (GLUCOPHAGE) 850 MG tablet Take 1 tablet (850  mg total) by mouth 2 (two) times daily with a meal. 05/09/23 05/03/24 Yes Dugal, Tabitha, FNP  olmesartan (BENICAR) 40 MG tablet Take 1 tablet (40 mg total) by mouth daily. 05/09/23  Yes Dugal, Wyatt Mage, FNP  rosuvastatin (CRESTOR) 40 MG tablet Take 1 tablet (40 mg total) by mouth daily. 01/31/23  Yes Dugal, Wyatt Mage, FNP  sertraline (ZOLOFT) 100 MG tablet Take 1 tablet (100 mg total) by mouth daily. 05/09/23  Yes Mort Sawyers, FNP  spironolactone (ALDACTONE) 25 MG tablet Take 1 tablet (25 mg total) by mouth daily. 05/09/23  Yes Dugal, Wyatt Mage, FNP  acetaminophen (TYLENOL) 500 MG tablet Take 500 mg by mouth every 6 (six) hours as needed. Patient not taking: Reported on 05/19/2023    [provider]  ascorbic acid (VITAMIN C) 500 MG tablet Take 1 tablet (500 mg total) by mouth daily. Patient not taking: Reported on 05/19/2023 03/26/23 06/24/23  Gillis Santa, MD  bisacodyl 5 MG EC tablet Take 2 tablets (10 mg total) by mouth daily as needed for moderate constipation. Patient not taking: Reported on 05/19/2023 03/25/23   Gillis Santa, MD  blood glucose meter kit and supplies KIT Dispense based on patient and insurance preference. Use two times daily as directed (before breakfast and at bedtime. (FOR ICD-10: E11.51) 02/11/22   Nche, Bonna Gains, NP  ferrous sulfate 325 (65 FE) MG tablet Take 1 tablet (325 mg total) by mouth daily with breakfast. Patient not taking: Reported on 05/19/2023 03/26/23 06/24/23  Gillis Santa, MD  folic acid (FOLVITE) 1 MG  tablet Take 1 tablet (1 mg total) by mouth daily. Patient not taking: Reported on 05/19/2023 03/26/23 06/24/23  Gillis Santa, MD  HYDROcodone-acetaminophen (NORCO/VICODIN) 5-325 MG tablet Take 1 tablet by mouth every 8 (eight) hours as needed for moderate pain. Patient not taking: Reported on 05/19/2023 03/31/23   Lurene Shadow, MD  polyethylene glycol (MIRALAX) 17 g packet Take 17 g by mouth daily. Patient not taking: Reported on 05/19/2023 03/25/23   Gillis Santa, MD  Scheduled Meds:  aspirin EC  81 mg Oral Daily   Chlorhexidine Gluconate Cloth  6 each Topical Daily   cyanocobalamin  1,000 mcg Oral Daily   folic acid  1 mg Oral Daily   insulin aspart  0-15 Units Subcutaneous TID WC   insulin aspart  0-5 Units Subcutaneous QHS   polyethylene glycol  17 g Oral Daily   rosuvastatin  40 mg Oral QHS   sertraline  100 mg Oral Daily   Continuous Infusions:  sodium chloride Stopped (05/21/23 0815)   ceFEPime (MAXIPIME) IV 2 g (05/22/23 0981)   heparin 1,450 Units/hr (05/22/23 1914)   lactated ringers 100 mL/hr at 05/22/23 0647   norepinephrine (LEVOPHED) Adult infusion 2 mcg/min (05/22/23 0514)   vancomycin     PRN Meds:.acetaminophen **OR** acetaminophen, bisacodyl, hydrALAZINE, morphine injection, nicotine, ondansetron **OR** ondansetron (ZOFRAN) IV, oxyCODONE-acetaminophen, senna-docusate    Assessment & Plan:   #Sepsis - acute osteomyelitis of the left foot #? Septic arthritis seen on MRI Foot -Supplemental oxygen as needed, to maintain SpO2 > 90% -F/u cultures, trend lactic/ PCT -Monitor WBC/ fever curve -IV antibiotics: cefepime & vancomycin  -IVF hydration as needed -Pressors for MAP goal >65 -Strict I/O's  #Ischemic Limb of LLE with Ulceration #PAD status post right BKA #Tobacco abuse -s/p left lower extremity angioplasty with stent placement -Plan for left lower foot amputation -Continue heparin drip per nomogram -Smoking cessation -Appreciate input from vascular and  podiatry  #HTN #HLD #Hx of CVA -  Hold home BP meds due to hypotension Continue aspirin 81 mg and Plavix 75 mg Continue rosuvastatin 40 mg   #T2 Diabetes mellitus #Check Hemoglobin A1c -CBGs Q ACHS -Sliding scale insulin -Follow ICU hyper/hypoglycemia protocol -Hold home Metformin     Best practice:  Diet:  NPO Pain/Anxiety/Delirium protocol (if indicated): No VAP protocol (if indicated): Not indicated DVT prophylaxis: Systemic AC GI prophylaxis: N/A Glucose control:  SSI No Central venous access:  N/A Arterial line:  N/A Foley:  N/A Mobility:  bed rest  PT consulted: N/A Last date of multidisciplinary goals of care discussion [05/21/23] Code Status:  full code Disposition: ICU   = Goals of Care = Code Status Order: FULL  Primary Emergency Contact: Miller,[name], Home Phone: [phone] Wishes to pursue full aggressive treatment and intervention options, including CPR and intubation, Critical care provider statement:   Total critical care time: 33 minutes   Performed by: Karna Christmas MD   Critical care time was exclusive of separately billable procedures and treating other patients.   Critical care was necessary to treat or prevent imminent or life-threatening deterioration.   Critical care was time spent personally by me on the following activities: development of treatment plan with patient and/or surrogate as well as nursing, discussions with consultants, evaluation of patient's response to treatment, examination of patient, obtaining history from patient or surrogate, ordering and performing treatments and interventions, ordering and review of laboratory studies, ordering and review of radiographic studies, pulse oximetry and re-evaluation of patient's condition.    Vida Rigger, M.D.  Pulmonary & Critical Care Medicine

## 2023-05-22 NOTE — Consult Note (Signed)
ANTICOAGULATION CONSULT NOTE   Pharmacy Consult for heparin infusion Indication:  Lower extremity ischemia  No Known Allergies  Patient Measurements: Height: 6\' 1"  (185.4 cm) Weight: 64.4 kg (141 lb 15.6 oz) IBW/kg (Calculated) : 79.9 Heparin Dosing Weight: 64.4 kg  Vital Signs: Temp: 98.5 F (36.9 C) (06/16 1200) Temp Source: Oral (06/16 1200) BP: 112/57 (06/16 1400) Pulse Rate: 89 (06/16 1500)  Labs: Recent Labs    05/20/23 0710 05/20/23 2258 05/21/23 0524 05/21/23 0657 05/22/23 0420 05/22/23 1408  HGB 10.0* 10.1* 8.8*  --  8.8*  --   HCT 31.3* 31.7* 27.0*  --  27.1*  --   PLT 231 218 208  --  190  --   HEPARINUNFRC 0.26* 0.48  --  0.32 0.15* 0.19*  CREATININE 1.12  --  1.53*  --  1.39*  1.41*  --     Estimated Creatinine Clearance: 44.4 mL/min (A) (by C-G formula based on SCr of 1.41 mg/dL (H)).  Medical History: Past Medical History:  Diagnosis Date   Basal cell carcinoma 08/11/2022   R forearm - ED&C   Depression    Foot ulcer (HCC) 12/12/2016   Hyperlipidemia    Hypertension    PAD (peripheral artery disease) (HCC) ~2007   s/p R BKA for non-healing wound   Stroke (HCC) 03/2014   MRI: Acute nonhemorrhagic left paracentral pontine infarct. 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   TIA (transient ischemic attack) 01/2014    Medications:  No prior anticoagulation noted   Assessment: 72 year old male with history of hypertension, hyperlipidemia, depression, TIA, R BKA who presents to the emergency department for chief concerns of left foot pain and redness with swelling. Pt with history of prior lower extremity stents placed 02/2023. Pharmacy has been consulted to initiate and manage IV heparin therapy for lower extremity aterial occlusion   *Patient in need of toe amputation. Plan angio for 05/20/2023 and possible amputation on Saturday*  Goal of Therapy:  Heparin level 0.3-0.7  units/ml Monitor platelets by anticoagulation protocol: Yes   06/12 2200 HL 0.30, therapeutic x 1 06/13 0606 HL 0.21, subtherapeutic @ 1100 u/hr 06/13 1648 HL 0.37 therapeutic x 1 @ 1200 units/hr 06/13 2317 HL 0.33 therapeutic x 2 06/14 0710 HL 0.26, sub-therapeutic 06/14 2258 HL 0.48, therapeutic x 1 06/15 0657 HL 0.32, therapeutic x 1 06/16 0420 HL 0.15, subtherapeutic x 1 06/16 1408 HL 0.19,  subtherapeutic  Plan:  s/p procedure on 05/20/23 06/16 1408 HL 0.19,  subtherapeutic Increase heparin infusion rate from 1450 to 1600 units/hr without bolus per vascular plans S/p toes amputation on 6/15 Recheck HL in 8 hrs after rate change Daily CBC while on heparin  Bari Mantis PharmD Clinical Pharmacist 05/22/2023

## 2023-05-22 NOTE — Consult Note (Signed)
ANTICOAGULATION CONSULT NOTE   Pharmacy Consult for heparin infusion Indication:  Lower extremity ischemia  No Known Allergies  Patient Measurements: Height: 6\' 1"  (185.4 cm) Weight: 64.4 kg (141 lb 15.6 oz) IBW/kg (Calculated) : 79.9 Heparin Dosing Weight: 64.4 kg  Vital Signs: Temp: 98.7 F (37.1 C) (06/16 0400) Temp Source: Axillary (06/16 0400) BP: 98/56 (06/16 0445) Pulse Rate: 95 (06/16 0445)  Labs: Recent Labs    05/20/23 0710 05/20/23 2258 05/21/23 0524 05/21/23 0657 05/22/23 0420  HGB 10.0* 10.1* 8.8*  --  8.8*  HCT 31.3* 31.7* 27.0*  --  27.1*  PLT 231 218 208  --  190  HEPARINUNFRC 0.26* 0.48  --  0.32 0.15*  CREATININE 1.12  --  1.53*  --  1.41*    Estimated Creatinine Clearance: 44.4 mL/min (A) (by C-G formula based on SCr of 1.41 mg/dL (H)).  Medical History: Past Medical History:  Diagnosis Date   Basal cell carcinoma 08/11/2022   R forearm - ED&C   Depression    Foot ulcer (HCC) 12/12/2016   Hyperlipidemia    Hypertension    PAD (peripheral artery disease) (HCC) ~2007   s/p R BKA for non-healing wound   Stroke (HCC) 03/2014   MRI: Acute nonhemorrhagic left paracentral pontine infarct. 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   TIA (transient ischemic attack) 01/2014    Medications:  No prior anticoagulation noted   Assessment: 72 year old male with history of hypertension, hyperlipidemia, depression, TIA, R BKA who presents to the emergency department for chief concerns of left foot pain and redness with swelling. Pt with history of prior lower extremity stents placed 02/2023. Pharmacy has been consulted to initiate and manage IV heparin therapy for lower extremity aterial occlusion   *Patient in need of toe amputation. Plan angio for 05/20/2023 and possible amputation on Saturday*  Goal of Therapy:  Heparin level 0.3-0.7 units/ml Monitor platelets by anticoagulation  protocol: Yes   06/12 2200 HL 0.30, therapeutic x 1 06/13 0606 HL 0.21, subtherapeutic @ 1100 u/hr 06/13 1648 HL 0.37 therapeutic x 1 @ 1200 units/hr 06/13 2317 HL 0.33 therapeutic x 2 06/14 0710 HL 0.26, sub-therapeutic 06/14 2258 HL 0.48, therapeutic x 1 06/15 0657 HL 0.32, therapeutic x 1 06/16 0420 HL 0.15, subtherapeutic x 1  Plan:  s/p procedure on 05/20/23 Increase heparin infusion rate of 1450 units/hr without bolus per vascular plans For toes amputation on 6/15 Recheck HL in 8 hrs after rate change Daily CBC while on heparin  Otelia Sergeant, PharmD, [facility] 05/22/2023 5:46 AM

## 2023-05-22 NOTE — Consult Note (Signed)
PHARMACY CONSULT NOTE  Pharmacy Consult for Electrolyte Monitoring and Replacement   Recent Labs: Potassium (mmol/L)  Date Value  05/22/2023 3.3 (L)   Magnesium (mg/dL)  Date Value  16/09/9603 1.3 (L)   Calcium (mg/dL)  Date Value  54/08/8118 8.5 (L)   Albumin (g/dL)  Date Value  14/78/2956 3.8   Phosphorus (mg/dL)  Date Value  21/30/8657 3.6   Sodium (mmol/L)  Date Value  05/22/2023 135  02/03/2016 139   Assessment: 72 y/o M with medical history including basal cell carcinoma, anxiety / depression, PAD s/p right BKA, CVA, HTN, HLD admitted with OM left first and third toes s/p amputation and bilateral common iliac artery stenting for left lower extremity limb threatening ischemia. Pharmacy consulted to assist with electrolyte monitoring and replacement as indicated.  Goal of Therapy:  Electrolytes within normal limits  Plan:  --K 3.3, Kcl 40 mEq PO x 1 dose --Follow-up electrolytes with AM labs tomorrow  Tressie Ellis 05/22/2023 3:02 PM

## 2023-05-22 NOTE — Progress Notes (Signed)
1 Day Post-Op   Subjective/Chief Complaint: Patient seen.  Having some pain but well-controlled with medication.   Objective: Vital signs in last 24 hours: Temp:  [97.9 F (36.6 C)-98.8 F (37.1 C)] 98.1 F (36.7 C) (06/16 0800) Pulse Rate:  [72-95] 84 (06/16 1100) Resp:  [9-38] 18 (06/16 1100) BP: (88-118)/(35-92) 110/55 (06/16 1100) SpO2:  [90 %-99 %] 94 % (06/16 1100) Last BM Date : 05/19/23  Intake/Output from previous day: 06/15 0701 - 06/16 0700 In: 3157.8 [P.O.:390; I.V.:2631.7; IV Piggyback:136.1] Out: 1100 [Urine:1100] Intake/Output this shift: Total I/O In: -  Out: 700 [Urine:700]  Bandage on the left foot is dry and intact.  Upon removal there is minimal bleeding on the bandage.  Incisions are well coapted.  Some mild blanching of the plantar skin flap at the hallux ulceration but overall at this point appears stable.  Lab Results:  Recent Labs    05/21/23 0524 05/22/23 0420  WBC 15.5* 14.7*  HGB 8.8* 8.8*  HCT 27.0* 27.1*  PLT 208 190   BMET Recent Labs    05/21/23 0524 05/22/23 0420  NA 138 135  K 3.5 3.3*  CL 111 108  CO2 17* 20*  GLUCOSE 125* 141*  BUN 27* 28*  CREATININE 1.53* 1.39*  1.41*  CALCIUM 8.7* 8.5*   PT/INR No results for input(s): "LABPROT", "INR" in the last 72 hours. ABG Recent Labs    05/21/23 0524  HCO3 18.5*    Studies/Results: PERIPHERAL VASCULAR CATHETERIZATION  Result Date: 05/20/2023 See surgical note for result.   Anti-infectives: Anti-infectives (From admission, onward)    Start     Dose/Rate Route Frequency Ordered Stop   05/22/23 0830  vancomycin (VANCOCIN) IVPB 1000 mg/200 mL premix        1,000 mg 200 mL/hr over 60 Minutes Intravenous Every 24 hours 05/22/23 0734     05/21/23 1032  vancomycin variable dose per unstable renal function (pharmacist dosing)  Status:  Discontinued         Does not apply See admin instructions 05/21/23 1032 05/22/23 0734   05/20/23 1400  ceFAZolin (ANCEF) IVPB 2g/100 mL  premix        2 g 200 mL/hr over 30 Minutes Intravenous 30 min pre-op 05/20/23 1400 05/20/23 1552   05/19/23 2000  vancomycin (VANCOCIN) IVPB 1000 mg/200 mL premix  Status:  Discontinued        1,000 mg 200 mL/hr over 60 Minutes Intravenous Every 24 hours 05/18/23 1827 05/19/23 1034   05/19/23 2000  vancomycin (VANCOREADY) IVPB 1250 mg/250 mL  Status:  Discontinued        1,250 mg 166.7 mL/hr over 90 Minutes Intravenous Every 24 hours 05/19/23 1034 05/21/23 1032   05/18/23 1900  vancomycin (VANCOREADY) IVPB 1500 mg/300 mL        1,500 mg 150 mL/hr over 120 Minutes Intravenous  Once 05/18/23 1827 05/18/23 2120   05/18/23 1830  ceFEPIme (MAXIPIME) 2 g in sodium chloride 0.9 % 100 mL IVPB        2 g 200 mL/hr over 30 Minutes Intravenous Every 12 hours 05/18/23 1827     05/18/23 1400  cefTRIAXone (ROCEPHIN) 2 g in sodium chloride 0.9 % 100 mL IVPB        2 g 200 mL/hr over 30 Minutes Intravenous  Once 05/18/23 1347 05/18/23 1438       Assessment/Plan: s/p Procedure(s): AMPUTATION LEFT 1st & 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  Plan:  Betadine gauze applied to the incision areas followed by a sterile gauze dressing.  Plan for another dressing change in another 2 to 3 days.  Patient is at risk for the need for more extensive vascular interventions but if the toes appear to be healing well may be able to hold off.  Will leave that decision for vascular surgery.  LOS: 4 days    Ricci Barker 05/22/2023

## 2023-05-23 ENCOUNTER — Encounter: Payer: Self-pay | Admitting: Vascular Surgery

## 2023-05-23 DIAGNOSIS — I7025 Atherosclerosis of native arteries of other extremities with ulceration: Secondary | ICD-10-CM | POA: Diagnosis not present

## 2023-05-23 DIAGNOSIS — L97509 Non-pressure chronic ulcer of other part of unspecified foot with unspecified severity: Secondary | ICD-10-CM | POA: Diagnosis not present

## 2023-05-23 LAB — GLUCOSE, CAPILLARY
Glucose-Capillary: 115 mg/dL — ABNORMAL HIGH (ref 70–99)
Glucose-Capillary: 167 mg/dL — ABNORMAL HIGH (ref 70–99)
Glucose-Capillary: 116 mg/dL — ABNORMAL HIGH (ref 70–99)
Glucose-Capillary: 125 mg/dL — ABNORMAL HIGH (ref 70–99)
Glucose-Capillary: 149 mg/dL — ABNORMAL HIGH (ref 70–99)

## 2023-05-23 LAB — CBC
HCT: 24.4 % — ABNORMAL LOW (ref 39.0–52.0)
Hemoglobin: 7.9 g/dL — ABNORMAL LOW (ref 13.0–17.0)
MCH: 29.5 pg (ref 26.0–34.0)
MCHC: 32.4 g/dL (ref 30.0–36.0)
MCV: 91 fL (ref 80.0–100.0)
Platelets: 179 10*3/uL (ref 150–400)
RBC: 2.68 MIL/uL — ABNORMAL LOW (ref 4.22–5.81)
RDW: 16.9 % — ABNORMAL HIGH (ref 11.5–15.5)
WBC: 10.2 10*3/uL (ref 4.0–10.5)
nRBC: 0 % (ref 0.0–0.2)

## 2023-05-23 LAB — BASIC METABOLIC PANEL
Anion gap: 7 (ref 5–15)
BUN: 20 mg/dL (ref 8–23)
CO2: 20 mmol/L — ABNORMAL LOW (ref 22–32)
Calcium: 8.5 mg/dL — ABNORMAL LOW (ref 8.9–10.3)
Chloride: 109 mmol/L (ref 98–111)
Creatinine, Ser: 1.29 mg/dL — ABNORMAL HIGH (ref 0.61–1.24)
GFR, Estimated: 60 mL/min — ABNORMAL LOW (ref >60)
Glucose, Bld: 120 mg/dL — ABNORMAL HIGH (ref 70–99)
Potassium: 3.9 mmol/L (ref 3.5–5.1)
Sodium: 136 mmol/L (ref 135–145)

## 2023-05-23 LAB — HEPARIN LEVEL (UNFRACTIONATED)
Heparin Unfractionated: 0.12 IU/mL — ABNORMAL LOW (ref 0.30–0.70)
Heparin Unfractionated: 0.25 IU/mL — ABNORMAL LOW (ref 0.30–0.70)
Heparin Unfractionated: 0.29 IU/mL — ABNORMAL LOW (ref 0.30–0.70)
Heparin Unfractionated: 0.38 IU/mL (ref 0.30–0.70)

## 2023-05-23 LAB — CULTURE, BLOOD (ROUTINE X 2)

## 2023-05-23 LAB — MAGNESIUM: Magnesium: 1.6 mg/dL — ABNORMAL LOW (ref 1.7–2.4)

## 2023-05-23 MED ORDER — GLUCERNA SHAKE PO LIQD
237.0000 mL | Freq: Three times a day (TID) | ORAL | Status: DC
  Administered 2023-05-23: 237 mL via ORAL

## 2023-05-23 MED ORDER — VITAMIN C 500 MG PO TABS
500.0000 mg | ORAL_TABLET | Freq: Two times a day (BID) | ORAL | Status: DC
  Administered 2023-05-23 – 2023-06-03 (×23): 500 mg via ORAL
  Filled 2023-05-23 (×23): qty 1

## 2023-05-23 MED ORDER — ZINC SULFATE 220 (50 ZN) MG PO CAPS
220.0000 mg | ORAL_CAPSULE | Freq: Every day | ORAL | Status: DC
  Administered 2023-05-23 – 2023-06-03 (×12): 220 mg via ORAL
  Filled 2023-05-23 (×12): qty 1

## 2023-05-23 MED ORDER — MAGNESIUM SULFATE 2 GM/50ML IV SOLN
2.0000 g | Freq: Once | INTRAVENOUS | Status: AC
  Administered 2023-05-23: 2 g via INTRAVENOUS
  Filled 2023-05-23: qty 50

## 2023-05-23 MED ORDER — HEPARIN BOLUS VIA INFUSION
1000.0000 [IU] | Freq: Once | INTRAVENOUS | Status: AC
  Administered 2023-05-23: 1000 [IU] via INTRAVENOUS
  Filled 2023-05-23: qty 1000

## 2023-05-23 MED ORDER — LATANOPROST 0.005 % OP SOLN
1.0000 [drp] | Freq: Every day | OPHTHALMIC | Status: DC
  Administered 2023-05-23 – 2023-06-02 (×11): 1 [drp] via OPHTHALMIC
  Filled 2023-05-23 (×2): qty 2.5

## 2023-05-23 MED ORDER — ADULT MULTIVITAMIN W/MINERALS CH
1.0000 | ORAL_TABLET | Freq: Every day | ORAL | Status: DC
  Administered 2023-05-23 – 2023-06-03 (×12): 1 via ORAL
  Filled 2023-05-23 (×12): qty 1

## 2023-05-23 MED ORDER — ENSURE ENLIVE PO LIQD
237.0000 mL | Freq: Three times a day (TID) | ORAL | Status: DC
  Administered 2023-05-23 – 2023-06-03 (×28): 237 mL via ORAL

## 2023-05-23 NOTE — Progress Notes (Signed)
Called [name], sister, and informed her of patient transfer to room 139.

## 2023-05-23 NOTE — Progress Notes (Signed)
Initial Nutrition Assessment  DOCUMENTATION CODES:   Severe malnutrition in context of social or environmental circumstances  INTERVENTION:   -Liberalize diet to regular for widest variety of meal selections -MVI with minerals daily -Ensure Enlive po TID, each supplement provides 350 kcal and 20 grams of protein.  -500 mg vitamin C BID -220 mg zinc sulfate daily x 14 days  NUTRITION DIAGNOSIS:   Severe Malnutrition related to social / environmental circumstances as evidenced by mild fat depletion, moderate fat depletion, moderate muscle depletion, severe muscle depletion, percent weight loss.  GOAL:   Patient will meet greater than or equal to 90% of their needs  MONITOR:   PO intake, Supplement acceptance  REASON FOR ASSESSMENT:   Consult Assessment of nutrition requirement/status  ASSESSMENT:   Pt with history of hypertension, hyperlipidemia, depression, TIA, who presents for chief concerns of left foot pain and redness with swelling.  Pt admitted with ischemic foot ulcers due to atherosclerosis of native artery limb.   6/14- s/p lt lower extremity angiogram 6/15- s/p Amputation left great toe at the IPJ; Amputation left third toe metatarsophalangeal joint.  Reviewed I/O's: +695 ml x 24 hours and +4.8 L since admission  Case discussed with MD; awaiting podiatry and vascular surgery input for further recommendations.   Spoke with pt and sister at bedside. Sister provided most of history. Sister recently assumed care for pt when home was found in deplorable constitutions from last hospitalization. Sister reports that pt was making good progress at home and was scheduled to obtain a new prosthetic leg, however, noted drainage from lt toes which prompted admission to hospital. Pt typically cosnumes 3 meals per day, which consist of meat, starch, and vegetable provided by sister and her husband. Over the past few weeks, pt intake has declined and pt mainly taking bites and  sips with meals. She has supplements with Ensure, zinc, vitamin C, vitamin B, and multivitamins.   Pt with poor oral intake this admission. Noted meal completions 0-100%. Pt consumed 100% of biscuit brought in by sister and a few bites of homemade dinner last night.   Pt unsure of UBW. Per sister, pt has lost weight as he has requested a belt for his clothing. Reviewed wt hx; pt has experienced a 8.4% wt loss over the past 2 months, which is significant for time frame.   Discussed importance of good meal and supplement intake to promote healing. Amenable to supplements, vitamins, and diet liberalization. Also encouraged bringing in outside food if needed.   Medications reviewed and include vitamin B-12, folic acid, miralax, and lactated ringers @ 100 ml/hr.   Lab Results  Component Value Date   HGBA1C 6.2 01/24/2023   PTA DM medications are 850 mg metformin BID.   Labs reviewed: Mg: 1.6, CBGS: 167 (inpatient orders for glycemic control are 0-15 units insulin aspart TID with meals and 0-5 units insulin aspart daily at bedtime).    NUTRITION - FOCUSED PHYSICAL EXAM:  Flowsheet Row Most Recent Value  Orbital Region Moderate depletion  Upper Arm Region Moderate depletion  Thoracic and Lumbar Region Moderate depletion  Buccal Region Moderate depletion  Temple Region Moderate depletion  Clavicle Bone Region Moderate depletion  Clavicle and Acromion Bone Region Moderate depletion  Scapular Bone Region Moderate depletion  Dorsal Hand Severe depletion  Patellar Region Severe depletion  Anterior Thigh Region Severe depletion  Posterior Calf Region Severe depletion  Edema (RD Assessment) None  Hair Reviewed  Eyes Reviewed  Mouth Reviewed  Skin Reviewed  Nails Reviewed        Diet Order:   Diet Order             Diet regular Fluid consistency: Thin  Diet effective now                   EDUCATION NEEDS:   Education needs have been addressed  Skin:  Skin Assessment: Skin  Integrity Issues: Skin Integrity Issues:: Incisions Incisions: closed lt foot  Last BM:  05/22/23  Height:   Ht Readings from Last 1 Encounters:  05/20/23 6\' 1"  (1.854 m)    Weight:   Wt Readings from Last 1 Encounters:  05/20/23 64.4 kg    Ideal Body Weight:  78.2 kg (adjusted for rt BKA)  BMI:  Body mass index is 18.73 kg/m.  Estimated Nutritional Needs:   Kcal:  2050-2250  Protein:  115-130 grams  Fluid:  > 2 L    Levada Schilling, RD, LDN, CDCES Registered Dietitian II Certified Diabetes Care and Education Specialist Please refer to [facility] for RD and/or RD on-call/weekend/after hours pager

## 2023-05-23 NOTE — Consult Note (Signed)
ANTICOAGULATION CONSULT NOTE   Pharmacy Consult for heparin infusion Indication:  Lower extremity ischemia  No Known Allergies  Patient Measurements: Height: 6\' 1"  (185.4 cm) Weight: 64.4 kg (141 lb 15.6 oz) IBW/kg (Calculated) : 79.9 Heparin Dosing Weight: 64.4 kg  Vital Signs: Temp: 98.3 F (36.8 C) (06/17 0657) Temp Source: Oral (06/17 0657) BP: 123/59 (06/17 0657) Pulse Rate: 75 (06/17 0657)  Labs: Recent Labs    05/21/23 0524 05/21/23 0657 05/22/23 0420 05/22/23 1408 05/22/23 2345 05/23/23 0529 05/23/23 0755 05/23/23 1341  HGB 8.8*  --  8.8*  --   --  7.9*  --   --   HCT 27.0*  --  27.1*  --   --  24.4*  --   --   PLT 208  --  190  --   --  179  --   --   HEPARINUNFRC  --    < > 0.15*   < > 0.29*  --  0.38 0.12*  CREATININE 1.53*  --  1.39*  1.41*  --   --  1.29*  --   --    < > = values in this interval not displayed.    Estimated Creatinine Clearance: 48.5 mL/min (A) (by C-G formula based on SCr of 1.29 mg/dL (H)).  Medical History: Past Medical History:  Diagnosis Date   Basal cell carcinoma 08/11/2022   R forearm - ED&C   Depression    Foot ulcer (HCC) 12/12/2016   Hyperlipidemia    Hypertension    PAD (peripheral artery disease) (HCC) ~2007   s/p R BKA for non-healing wound   Stroke (HCC) 03/2014   MRI: Acute nonhemorrhagic left paracentral pontine infarct. 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   TIA (transient ischemic attack) 01/2014    Medications:  No prior anticoagulation noted   Assessment: 72 year old male with history of hypertension, hyperlipidemia, depression, TIA, R BKA who presents to the emergency department for chief concerns of left foot pain and redness with swelling. Pt with history of prior lower extremity stents placed 02/2023. Pharmacy has been consulted to initiate and manage IV heparin therapy for lower extremity aterial occlusion   *Patient in  need of toe amputation. Plan angio for 05/20/2023 and possible amputation on Saturday*  Goal of Therapy:  Heparin level 0.3-0.7 units/ml Monitor platelets by anticoagulation protocol: Yes   06/15 0657 HL 0.32, therapeutic x 1 06/16 0420 HL 0.15, subtherapeutic x 1 06/16 1408 HL 0.19,  subtherapeutic 06/16 2313 HL 0.29, subtherapeutic  06/17 0755 HL 0.38, therapeutic x 1 06/17 1341 HL 0.12, subtherapeutic @ 1700 u/hr  Plan: HL subtherapeutic Spoke with nurse and she says bag ran out around 1315 and new bag was hung around 1330, this would have been around when they were drawing the lab.  Will repeat HL. Continue heparin infusion at rate of 1700 units/hr Recheck heparin level in 6 hours Daily CBC while on heparin  Barrie Folk, PharmD Clinical Pharmacist 05/23/2023

## 2023-05-23 NOTE — Consult Note (Signed)
PHARMACY CONSULT NOTE  Pharmacy Consult for Electrolyte Monitoring and Replacement   Recent Labs: Potassium (mmol/L)  Date Value  05/23/2023 3.9   Magnesium (mg/dL)  Date Value  81/19/1478 1.6 (L)   Calcium (mg/dL)  Date Value  29/56/2130 8.5 (L)   Albumin (g/dL)  Date Value  86/57/8469 3.8   Phosphorus (mg/dL)  Date Value  62/95/2841 3.6   Sodium (mmol/L)  Date Value  05/23/2023 136  02/03/2016 139   Assessment: 72 y/o M with medical history including basal cell carcinoma, anxiety / depression, PAD s/p right BKA, CVA, HTN, HLD admitted with OM left first and third toes s/p amputation and bilateral common iliac artery stenting for left lower extremity limb threatening ischemia. Pharmacy consulted to assist with electrolyte monitoring and replacement as indicated.  Goal of Therapy:  Electrolytes within normal limits  Plan:  --Mag 1.6, MagSulf 2 grams IV x 1  --Follow-up electrolytes with AM labs tomorrow  Barrie Folk, PharmD 05/23/2023 7:02 AM

## 2023-05-23 NOTE — Progress Notes (Signed)
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) Active Problems:   Essential hypertension   Dyslipidemia   Depression   History of CVA (cerebrovascular accident) without residual deficits   Tobacco abuse   PAD s/p right BKA, history revascularization left leg   Hypertriglyceridemia   Status post femoral-popliteal bypass surgery   Left renal artery stenosis (HCC)   Recurrent major depressive disorder, in full remission (HCC)   Heart failure with preserved ejection fraction (HCC)   Encounter for tobacco use cessation counseling   At risk for constipation   Acute hematogenous osteomyelitis of left foot (HCC)  Assessment and Plan: Sepsis: see Dr. Terence Lux note on how pt met sepsis criteria. Secondary to acute osteomyelitis of the left foot. Sepsis resolved   Septic arthritis: seen on MRI foot. Continue on IV cefepime, vanco. Blood cxs NGTD. Foot cx growing staph lugdenensis.    Ischemic Limb of LLE: w/ ulceration. S/p LLE angioplasty w/ stent placement. Continue on IV heparin. Vascu surg following and recs apprec   PAD: hx of right BKA. Continue w/ supportive care   Tobacco abuse: received smoking cessation counseling already    HTN: holding home dose of amlodipine, aldactone   HLD: continue on statin   Hx of CVA: continue on statin, aspirin. Holding home dose of plavix while on IV heparin    DM2: well controlled, HbA1c 6.2. Continue on SSI w/ accuchecks     Likely ACD: H&H are labile. Will transfuse if Hb < 7.0    DVT prophylaxis: IV heparin  Code Status: full  Family Communication: discussed pt's care w/ pt's sister, [name], and answered her questions Disposition Plan: depends on PT/OT recs (not consulted yet)  Level of care: Med-Surg Status is: Inpatient Remains inpatient appropriate  because: severity of illness    Consultants:  ICU Vasc surg Podiatry   Procedures:   Antimicrobials: cefepime, vanco    Subjective: Pt c/o fatigue   Objective: Vitals:   05/23/23 0400 05/23/23 0500 05/23/23 0600 05/23/23 0657  BP: (!) 116/59 (!) 112/58 (!) 112/56 (!) 123/59  Pulse: 90 79 84 75  Resp: (!) 21 19 18 18   Temp: 98.3 F (36.8 C)   98.3 F (36.8 C)  TempSrc: Oral   Oral  SpO2: 95% 90% 93% 95%  Weight:      Height:        Intake/Output Summary (Last 24 hours) at 05/23/2023 0712 Last data filed at 05/23/2023 0630 Gross per 24 hour  Intake 2799.99 ml  Output 2825 ml  Net -25.01 ml   Filed Weights   05/18/23 1138 05/19/23 2354 05/20/23 1444  Weight: 64.4 kg 64.4 kg 64.4 kg    Examination:  General exam: Appears calm and comfortable  Respiratory system: Clear to auscultation. Respiratory effort normal. Cardiovascular system: S1 & S2+. No rubs, gallops or clicks.  Gastrointestinal system: Abdomen is nondistended, soft and nontender. Normal bowel sounds heard. Central nervous system: Alert and oriented. Psychiatry: Judgement and insight appears at baseline. Flat mood and affect     Data Reviewed: I have personally reviewed following labs and imaging studies  CBC: Recent Labs  Lab 05/18/23 1148 05/19/23 0606 05/20/23 0710 05/20/23 2258 05/21/23 0524 05/22/23 0420 05/23/23 0529  WBC 11.4*   < > 8.8 8.9 15.5* 14.7* 10.2  NEUTROABS 7.4  --   --  8.4*  --   --   --   HGB 10.6*   < > 10.0* 10.1* 8.8* 8.8* 7.9*  HCT 33.5*   < > 31.3* 31.7* 27.0* 27.1* 24.4*  MCV 93.8   < > 92.9 93.8 93.1 92.2 91.0  PLT 274   < > 231 218 208 190 179   < > = values in this interval not displayed.   Basic Metabolic Panel: Recent Labs  Lab 05/19/23 0606 05/20/23 0710 05/21/23 0524 05/22/23 0420 05/23/23 0529  NA 139 140 138 135 136  K 4.5 4.3 3.5 3.3* 3.9  CL 111 111 111 108 109  CO2 20* 20* 17* 20* 20*  GLUCOSE 128* 118* 125* 141* 120*  BUN 25* 25* 27*  28* 20  CREATININE 1.11 1.12 1.53* 1.39*  1.41* 1.29*  CALCIUM 9.1 8.9 8.7* 8.5* 8.5*  MG  --  2.0 1.3*  --  1.6*   GFR: Estimated Creatinine Clearance: 48.5 mL/min (A) (by C-G formula based on SCr of 1.29 mg/dL (H)). Liver Function Tests: Recent Labs  Lab 05/18/23 1148  AST 19  ALT 11  ALKPHOS 68  BILITOT 0.1*  PROT 7.6  ALBUMIN 3.8   No results for input(s): "LIPASE", "AMYLASE" in the last 168 hours. No results for input(s): "AMMONIA" in the last 168 hours. Coagulation Profile: Recent Labs  Lab 05/18/23 1521  INR 1.1   Cardiac Enzymes: No results for input(s): "CKTOTAL", "CKMB", "CKMBINDEX", "TROPONINI" in the last 168 hours. BNP (last 3 results) No results for input(s): "PROBNP" in the last 8760 hours. HbA1C: No results for input(s): "HGBA1C" in the last 72 hours. CBG: Recent Labs  Lab 05/21/23 1936 05/22/23 0731 05/22/23 1115 05/22/23 1641 05/22/23 2119  GLUCAP 130* 154* 118* 108* 115*   Lipid Profile: No results for input(s): "CHOL", "HDL", "LDLCALC", "TRIG", "CHOLHDL", "LDLDIRECT" in the last 72 hours. Thyroid Function Tests: No results for input(s): "TSH", "T4TOTAL", "FREET4", "T3FREE", "THYROIDAB" in the last 72 hours. Anemia Panel: No results for input(s): "VITAMINB12", "FOLATE", "FERRITIN", "TIBC", "IRON", "RETICCTPCT" in the last 72 hours. Sepsis Labs: Recent Labs  Lab 05/20/23 2152 05/20/23 2353  LATICACIDVEN 2.2* 1.6    Recent Results (from the past 240 hour(s))  Culture, blood (Routine X 2) w Reflex to ID Panel     Status: None (Preliminary result)   Collection Time: 05/20/23 10:58 PM   Specimen: BLOOD  Result Value Ref Range Status   Specimen Description BLOOD BLOOD RIGHT ARM  Final   Special Requests   Final    BOTTLES DRAWN AEROBIC AND ANAEROBIC Blood Culture adequate volume   Culture   Final    NO GROWTH 2 DAYS Performed at [facility], [address] [place], [place] [address]    Report Status PENDING  Incomplete   Culture, blood (Routine X 2) w Reflex to ID Panel     Status: None (Preliminary result)   Collection Time: 05/20/23 11:53 PM   Specimen: BLOOD  Result Value Ref Range Status   Specimen Description BLOOD BLOOD LEFT ARM  Final   Special Requests   Final    BOTTLES DRAWN AEROBIC AND ANAEROBIC Blood Culture adequate volume   Culture   Final    NO GROWTH 1 DAY Performed at [facility], [address], [place], [place] [address]    Report Status PENDING  Incomplete  Aerobic/Anaerobic Culture w Gram Stain (surgical/deep wound)     Status: None (Preliminary result)   Collection Time: 05/21/23  9:13 AM  Specimen: Bone; Tissue  Result Value Ref Range Status   Specimen Description   Final    BONE Performed at [facility], [address] [place], [place] [address]    Special Requests   Final    NONE Performed at [facility], [address] [place], [place] [address]    Gram Stain   Final    MODERATE WBC PRESENT, PREDOMINANTLY PMN RARE GRAM POSITIVE COCCI RARE GRAM POSITIVE RODS CRITICAL RESULT CALLED TO, READ BACK BY AND VERIFIED WITH: RN Georgian Co [id number] AT 1457 BY EC    Culture   Final    FEW STAPHYLOCOCCUS LUGDUNENSIS SUSCEPTIBILITIES TO FOLLOW Performed at [facility], [address], [place], [place] [address]    Report Status PENDING  Incomplete         Radiology Studies: No results found.      Scheduled Meds:  aspirin EC  81 mg Oral Daily   Chlorhexidine Gluconate Cloth  6 each Topical Daily   cyanocobalamin  1,000 mcg Oral Daily   folic acid  1 mg Oral Daily   insulin aspart  0-15 Units Subcutaneous TID WC   insulin aspart  0-5 Units Subcutaneous QHS   polyethylene glycol  17 g Oral Daily   rosuvastatin  40 mg Oral QHS   sertraline  100 mg Oral Daily   Continuous Infusions:  sodium chloride Stopped (05/21/23 0815)   ceFEPime (MAXIPIME) IV 200 mL/hr at 05/23/23 0600   heparin 1,700 Units/hr (05/23/23 0600)    lactated ringers 100 mL/hr at 05/23/23 0600   magnesium sulfate bolus IVPB     norepinephrine (LEVOPHED) Adult infusion Stopped (05/22/23 0929)   vancomycin Stopped (05/22/23 1038)     LOS: 5 days    Time spent: 25 mins     Charise Killian, MD [facility] Pager [phone]  If 7PM-7AM, please contact night-coverage [email or web address] 05/23/2023, 7:12 AM

## 2023-05-23 NOTE — Plan of Care (Signed)
  Problem: Education: Goal: Ability to describe self-care measures that may prevent or decrease complications (Diabetes Survival Skills Education) will improve Outcome: Progressing   Problem: Coping: Goal: Ability to adjust to condition or change in health will improve Outcome: Progressing   Problem: Fluid Volume: Goal: Ability to maintain a balanced intake and output will improve Outcome: Progressing   Problem: Metabolic: Goal: Ability to maintain appropriate glucose levels will improve Outcome: Progressing   Problem: Nutritional: Goal: Maintenance of adequate nutrition will improve Outcome: Progressing   Problem: Tissue Perfusion: Goal: Adequacy of tissue perfusion will improve Outcome: Progressing   Problem: Education: Goal: Knowledge of General Education information will improve Description: Including pain rating scale, medication(s)/side effects and non-pharmacologic comfort measures Outcome: Progressing   

## 2023-05-23 NOTE — Consult Note (Signed)
ANTICOAGULATION CONSULT NOTE   Pharmacy Consult for heparin infusion Indication:  Lower extremity ischemia  No Known Allergies  Patient Measurements: Height: 6\' 1"  (185.4 cm) Weight: 64.4 kg (141 lb 15.6 oz) IBW/kg (Calculated) : 79.9 Heparin Dosing Weight: 64.4 kg  Vital Signs: Temp: 98 F (36.7 C) (06/17 0000) Temp Source: Oral (06/17 0000) BP: 110/59 (06/17 0000) Pulse Rate: 85 (06/17 0000)  Labs: Recent Labs    05/20/23 0710 05/20/23 2258 05/21/23 0524 05/21/23 0657 05/22/23 0420 05/22/23 1408 05/22/23 2345  HGB 10.0* 10.1* 8.8*  --  8.8*  --   --   HCT 31.3* 31.7* 27.0*  --  27.1*  --   --   PLT 231 218 208  --  190  --   --   HEPARINUNFRC 0.26* 0.48  --    < > 0.15* 0.19* 0.29*  CREATININE 1.12  --  1.53*  --  1.39*  1.41*  --   --    < > = values in this interval not displayed.    Estimated Creatinine Clearance: 44.4 mL/min (A) (by C-G formula based on SCr of 1.41 mg/dL (H)).  Medical History: Past Medical History:  Diagnosis Date   Basal cell carcinoma 08/11/2022   R forearm - ED&C   Depression    Foot ulcer (HCC) 12/12/2016   Hyperlipidemia    Hypertension    PAD (peripheral artery disease) (HCC) ~2007   s/p R BKA for non-healing wound   Stroke (HCC) 03/2014   MRI: Acute nonhemorrhagic left paracentral pontine infarct. 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   TIA (transient ischemic attack) 01/2014    Medications:  No prior anticoagulation noted   Assessment: 72 year old male with history of hypertension, hyperlipidemia, depression, TIA, R BKA who presents to the emergency department for chief concerns of left foot pain and redness with swelling. Pt with history of prior lower extremity stents placed 02/2023. Pharmacy has been consulted to initiate and manage IV heparin therapy for lower extremity aterial occlusion   *Patient in need of toe amputation. Plan angio for  05/20/2023 and possible amputation on Saturday*  Goal of Therapy:  Heparin level 0.3-0.7 units/ml Monitor platelets by anticoagulation protocol: Yes   06/12 2200 HL 0.30, therapeutic x 1 06/13 0606 HL 0.21, subtherapeutic @ 1100 u/hr 06/13 1648 HL 0.37 therapeutic x 1 @ 1200 units/hr 06/13 2317 HL 0.33 therapeutic x 2 06/14 0710 HL 0.26, sub-therapeutic 06/14 2258 HL 0.48, therapeutic x 1 06/15 0657 HL 0.32, therapeutic x 1 06/16 0420 HL 0.15, subtherapeutic x 1 06/16 1408 HL 0.19,  subtherapeutic 06/16 2313 HL 0.29, subtherapeutic   Plan: 6/16:  HL @ 2313 = 0.29, subtherapeutic - Will order heparin 1000 units IV X 1 bolus and increase drip rate to 1700 units/hr. - Will recheck HL 6 hrs after rate change S/p toes amputation on 6/15 Daily CBC while on heparin  Aron Inge D Clinical Pharmacist 05/23/2023

## 2023-05-23 NOTE — Consult Note (Addendum)
ANTICOAGULATION CONSULT NOTE   Pharmacy Consult for Heparin Infusion Indication:  Lower extremity ischemia  Patient Measurements: Height: 6\' 1"  (185.4 cm) Weight: 64.4 kg (141 lb 15.6 oz) IBW/kg (Calculated) : 79.9 Heparin Dosing Weight: 64.4 kg  Labs: Recent Labs    05/21/23 0524 05/21/23 0657 05/22/23 0420 05/22/23 1408 05/23/23 0529 05/23/23 0755 05/23/23 1341 05/23/23 1928  HGB 8.8*  --  8.8*  --  7.9*  --   --   --   HCT 27.0*  --  27.1*  --  24.4*  --   --   --   PLT 208  --  190  --  179  --   --   --   HEPARINUNFRC  --    < > 0.15*   < >  --  0.38 0.12* 0.25*  CREATININE 1.53*  --  1.39*  1.41*  --  1.29*  --   --   --    < > = values in this interval not displayed.    Estimated Creatinine Clearance: 48.5 mL/min (A) (by C-G formula based on SCr of 1.29 mg/dL (H)).  Medical History: Past Medical History:  Diagnosis Date   Basal cell carcinoma 08/11/2022   R forearm - ED&C   Depression    Foot ulcer (HCC) 12/12/2016   Hyperlipidemia    Hypertension    PAD (peripheral artery disease) (HCC) ~2007   s/p R BKA for non-healing wound   Stroke (HCC) 03/2014   MRI: Acute nonhemorrhagic left paracentral pontine infarct. 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   TIA (transient ischemic attack) 01/2014    Medications:  No prior anticoagulation noted   Assessment: 72 year old male with history of hypertension, hyperlipidemia, depression, TIA, R BKA who presents to the emergency department for chief concerns of left foot pain and redness with swelling. Pt with history of prior lower extremity stents placed 02/2023. Pharmacy has been consulted to initiate and manage IV heparin therapy for lower extremity aterial occlusion   Goal of Therapy:  Heparin level 0.3-0.7 units/ml Monitor platelets by anticoagulation protocol: Yes   0615 0657 HL 0.32, therapeutic x 1 0616 0420 HL 0.15, subtherapeutic  x 1 0616 1408 HL 0.19,  subtherapeutic 0616 2313 HL 0.29, subtherapeutic  0617 0755 HL 0.38, therapeutic x 1 0617 1341 HL 0.12, subtherapeutic 0617 1928 HL 0.25, subtherapeutic  Plan: --Heparin level is subtherapeutic --Heparin 1000 unit IV bolus and increase heparin infusion rate to 1800 units/hr --Recheck heparin level and CBC tomorrow AM  Jordan Arellano 05/23/2023

## 2023-05-23 NOTE — Consult Note (Signed)
ANTICOAGULATION CONSULT NOTE   Pharmacy Consult for heparin infusion Indication:  Lower extremity ischemia  No Known Allergies  Patient Measurements: Height: 6\' 1"  (185.4 cm) Weight: 64.4 kg (141 lb 15.6 oz) IBW/kg (Calculated) : 79.9 Heparin Dosing Weight: 64.4 kg  Vital Signs: Temp: 98.3 F (36.8 C) (06/17 0657) Temp Source: Oral (06/17 0657) BP: 123/59 (06/17 0657) Pulse Rate: 75 (06/17 0657)  Labs: Recent Labs    05/21/23 0524 05/21/23 0657 05/22/23 0420 05/22/23 1408 05/22/23 2345 05/23/23 0529 05/23/23 0755  HGB 8.8*  --  8.8*  --   --  7.9*  --   HCT 27.0*  --  27.1*  --   --  24.4*  --   PLT 208  --  190  --   --  179  --   HEPARINUNFRC  --    < > 0.15* 0.19* 0.29*  --  0.38  CREATININE 1.53*  --  1.39*  1.41*  --   --  1.29*  --    < > = values in this interval not displayed.    Estimated Creatinine Clearance: 48.5 mL/min (A) (by C-G formula based on SCr of 1.29 mg/dL (H)).  Medical History: Past Medical History:  Diagnosis Date   Basal cell carcinoma 08/11/2022   R forearm - ED&C   Depression    Foot ulcer (HCC) 12/12/2016   Hyperlipidemia    Hypertension    PAD (peripheral artery disease) (HCC) ~2007   s/p R BKA for non-healing wound   Stroke (HCC) 03/2014   MRI: Acute nonhemorrhagic left paracentral pontine infarct. 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   TIA (transient ischemic attack) 01/2014    Medications:  No prior anticoagulation noted   Assessment: 72 year old male with history of hypertension, hyperlipidemia, depression, TIA, R BKA who presents to the emergency department for chief concerns of left foot pain and redness with swelling. Pt with history of prior lower extremity stents placed 02/2023. Pharmacy has been consulted to initiate and manage IV heparin therapy for lower extremity aterial occlusion   *Patient in need of toe amputation. Plan angio for  05/20/2023 and possible amputation on Saturday*  Goal of Therapy:  Heparin level 0.3-0.7 units/ml Monitor platelets by anticoagulation protocol: Yes   06/15 0657 HL 0.32, therapeutic x 1 06/16 0420 HL 0.15, subtherapeutic x 1 06/16 1408 HL 0.19,  subtherapeutic 06/16 2313 HL 0.29, subtherapeutic  06/17 0755 HL 0.38, therapeutic x 1  Plan: HL therapeutic Continue heparin infusion at rate of 1700 units/hr Recheck heparin level in 6 hours Daily CBC while on heparin  Barrie Folk, PharmD Clinical Pharmacist 05/23/2023

## 2023-05-24 ENCOUNTER — Telehealth (INDEPENDENT_AMBULATORY_CARE_PROVIDER_SITE_OTHER): Payer: Self-pay

## 2023-05-24 DIAGNOSIS — E43 Unspecified severe protein-calorie malnutrition: Secondary | ICD-10-CM

## 2023-05-24 DIAGNOSIS — L97509 Non-pressure chronic ulcer of other part of unspecified foot with unspecified severity: Secondary | ICD-10-CM | POA: Diagnosis not present

## 2023-05-24 DIAGNOSIS — I7025 Atherosclerosis of native arteries of other extremities with ulceration: Secondary | ICD-10-CM | POA: Diagnosis not present

## 2023-05-24 HISTORY — DX: Unspecified severe protein-calorie malnutrition: E43

## 2023-05-24 LAB — CBC
HCT: 23.7 % — ABNORMAL LOW (ref 39.0–52.0)
Hemoglobin: 7.9 g/dL — ABNORMAL LOW (ref 13.0–17.0)
MCH: 30.3 pg (ref 26.0–34.0)
MCHC: 33.3 g/dL (ref 30.0–36.0)
MCV: 90.8 fL (ref 80.0–100.0)
Platelets: 169 10*3/uL (ref 150–400)
RBC: 2.61 MIL/uL — ABNORMAL LOW (ref 4.22–5.81)
RDW: 17.3 % — ABNORMAL HIGH (ref 11.5–15.5)
WBC: 8.2 10*3/uL (ref 4.0–10.5)
nRBC: 0 % (ref 0.0–0.2)

## 2023-05-24 LAB — HEPARIN LEVEL (UNFRACTIONATED)
Heparin Unfractionated: 0.32 IU/mL (ref 0.30–0.70)
Heparin Unfractionated: 0.38 IU/mL (ref 0.30–0.70)

## 2023-05-24 LAB — GLUCOSE, CAPILLARY
Glucose-Capillary: 155 mg/dL — ABNORMAL HIGH (ref 70–99)
Glucose-Capillary: 145 mg/dL — ABNORMAL HIGH (ref 70–99)
Glucose-Capillary: 139 mg/dL — ABNORMAL HIGH (ref 70–99)
Glucose-Capillary: 138 mg/dL — ABNORMAL HIGH (ref 70–99)

## 2023-05-24 LAB — BASIC METABOLIC PANEL
Anion gap: 8 (ref 5–15)
BUN: 19 mg/dL (ref 8–23)
CO2: 20 mmol/L — ABNORMAL LOW (ref 22–32)
Calcium: 8.2 mg/dL — ABNORMAL LOW (ref 8.9–10.3)
Chloride: 105 mmol/L (ref 98–111)
Creatinine, Ser: 1.34 mg/dL — ABNORMAL HIGH (ref 0.61–1.24)
GFR, Estimated: 57 mL/min — ABNORMAL LOW (ref >60)
Glucose, Bld: 180 mg/dL — ABNORMAL HIGH (ref 70–99)
Potassium: 3.4 mmol/L — ABNORMAL LOW (ref 3.5–5.1)
Sodium: 133 mmol/L — ABNORMAL LOW (ref 135–145)

## 2023-05-24 LAB — MAGNESIUM: Magnesium: 2.1 mg/dL (ref 1.7–2.4)

## 2023-05-24 LAB — AEROBIC/ANAEROBIC CULTURE W GRAM STAIN (SURGICAL/DEEP WOUND)

## 2023-05-24 LAB — CULTURE, BLOOD (ROUTINE X 2): Culture: NO GROWTH

## 2023-05-24 MED ORDER — OXYCODONE-ACETAMINOPHEN 5-325 MG PO TABS
1.0000 | ORAL_TABLET | ORAL | Status: DC | PRN
  Administered 2023-05-26 – 2023-05-27 (×2): 2 via ORAL
  Administered 2023-05-28: 1 via ORAL
  Administered 2023-05-30 – 2023-06-02 (×7): 2 via ORAL
  Filled 2023-05-24 (×4): qty 2
  Filled 2023-05-24: qty 1
  Filled 2023-05-24 (×5): qty 2

## 2023-05-24 MED ORDER — MORPHINE SULFATE (PF) 2 MG/ML IV SOLN: 1.0000 mg | INTRAVENOUS | Status: DC | PRN

## 2023-05-24 MED ORDER — ACETAMINOPHEN 325 MG PO TABS: 650.0000 mg | ORAL_TABLET | Freq: Four times a day (QID) | ORAL | Status: DC | PRN

## 2023-05-24 MED ORDER — POTASSIUM CHLORIDE CRYS ER 20 MEQ PO TBCR
40.0000 meq | EXTENDED_RELEASE_TABLET | Freq: Once | ORAL | Status: AC
  Administered 2023-05-24: 40 meq via ORAL
  Filled 2023-05-24: qty 2

## 2023-05-24 MED ORDER — ORAL CARE MOUTH RINSE: 15.0000 mL | OROMUCOSAL | Status: DC | PRN

## 2023-05-24 NOTE — Consult Note (Signed)
ANTICOAGULATION CONSULT NOTE   Pharmacy Consult for Heparin Infusion Indication:  Lower extremity ischemia  Patient Measurements: Height: 6\' 1"  (185.4 cm) Weight: 64.4 kg (141 lb 15.6 oz) IBW/kg (Calculated) : 79.9 Heparin Dosing Weight: 64.4 kg  Labs: Recent Labs    05/22/23 0420 05/22/23 1408 05/23/23 0529 05/23/23 0755 05/23/23 1928 05/24/23 0437 05/24/23 1100  HGB 8.8*  --  7.9*  --   --  7.9*  --   HCT 27.1*  --  24.4*  --   --  23.7*  --   PLT 190  --  179  --   --  169  --   HEPARINUNFRC 0.15*   < >  --    < > 0.25* 0.32 0.38  CREATININE 1.39*  1.41*  --  1.29*  --   --  1.34*  --    < > = values in this interval not displayed.    Estimated Creatinine Clearance: 46.7 mL/min (A) (by C-G formula based on SCr of 1.34 mg/dL (H)).  Medical History: Past Medical History:  Diagnosis Date   Basal cell carcinoma 08/11/2022   R forearm - ED&C   Depression    Foot ulcer (HCC) 12/12/2016   Hyperlipidemia    Hypertension    PAD (peripheral artery disease) (HCC) ~2007   s/p R BKA for non-healing wound   Stroke (HCC) 03/2014   MRI: Acute nonhemorrhagic left paracentral pontine infarct. 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   TIA (transient ischemic attack) 01/2014    Medications:  No prior anticoagulation noted   Assessment: 72 year old male with history of hypertension, hyperlipidemia, depression, TIA, R BKA who presents to the emergency department for chief concerns of left foot pain and redness with swelling. Pt with history of prior lower extremity stents placed 02/2023. Pharmacy has been consulted to initiate and manage IV heparin therapy for lower extremity aterial occlusion   Goal of Therapy:  Heparin level 0.3-0.7 units/ml Monitor platelets by anticoagulation protocol: Yes   0615 0657 HL 0.32, therapeutic x 1 0616 0420 HL 0.15, subtherapeutic x 1 0616 1408 HL 0.19,   subtherapeutic 0616 2313 HL 0.29, subtherapeutic  0617 0755 HL 0.38, therapeutic x 1 0617 1341 HL 0.12, subtherapeutic 0617 1928 HL 0.25, subtherapeutic 0618 0437 HL 0.32, therapeutic X 1  0618 1100 HL 0.38, therapeutic x 2  Plan: 0618 1100 HL 0.38, therapeutic x 2 - will continue pt on current rate of 1800 units/hr --Recheck heparin level and CBC tomorrow AM  Bari Mantis PharmD Clinical Pharmacist 05/24/2023

## 2023-05-24 NOTE — Progress Notes (Signed)
PROGRESS NOTE   HPI was taken from Dr. Sedalia Muta: [name] is a 72 year old male with history of hypertension, hyperlipidemia, depression, TIA, who presents to the emergency department for chief concerns of left foot pain and redness with swelling.   Vitals in the ED showed temperature of 98.3, respiration rate of 18, heart rate is 78, blood pressure 105/69, SpO2 97% on room air.   Serum sodium is 141, potassium 4.8, chloride 112, bicarb 19, BUN of 27, serum creatinine of 1.24, EGFR greater than 60, nonfasting blood glucose 122, WBC 11.4, hemoglobin 10.6, platelets of 274.   D-dimer was elevated at 1.53.   ED treatment: Heparin bolus and GGT, ceftriaxone 2 g IV one-time dose.  EDP discussed with vascular who recommends hospital admission for vascular intervention. ----------------------- At bedside, he is able to tell me his name, age, current location, current calendar year.   Patient reports that he noted his right foot had a wound over the last 3 to 4 days.  He does not know how the wound started. He denies known trauma. He denies any fever, chills, dysuria, hematuria, diarrhea, chest pain, shortness of breath.  As per Dr. Karna Christmas: 6/12: Admitted to hospitalist service with acute osteomyelitis of the left foot.  Vascular consulted for PAD with ulceration of LLE. 6/13: Patient evaluated by podiatry with plans for amputation of left first and third toes 6/14: S/p left lower extremity angiography with stent placement to the right: Iliac artery and right external iliac artery 6/15: Transferred to ICU for persistent hypotension despite IV fluids requiring vasopressors.  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) Active Problems:   Essential hypertension   Dyslipidemia   Depression   History of CVA  (cerebrovascular accident) without residual deficits   Tobacco abuse   PAD s/p right BKA, history revascularization left leg   Hypertriglyceridemia   Status post femoral-popliteal bypass surgery   Left renal artery stenosis (HCC)   Recurrent major depressive disorder, in full remission (HCC)   Heart failure with preserved ejection fraction (HCC)   Encounter for tobacco use cessation counseling   At risk for constipation   Acute hematogenous osteomyelitis of left foot (HCC)   Protein-calorie malnutrition, severe  Assessment and Plan: Sepsis: see Dr. Terence Lux note on how pt met sepsis criteria. Secondary to acute osteomyelitis of the left foot. Sepsis resolved   Septic arthritis: seen on MRI foot. Continue on IV vanco. D/c IV cefepime.  Blood cxs NGTD. Foot cx growing staph lugdenensis.   Ischemic Limb of LLE: w/ ulceration. S/p LLE angioplasty w/ stent placement. Continue on IV heparin. Vasc surg following and recs apprec   PAD: hx of right BKA. Likely go for angio on 05/26/23 as per vasc surg.  Continue w/ supportive care   Tobacco abuse: received smoking cessation counseling already   HTN: holding home dose of aldactone, amlodipine   HLD: continue on statin   Hx of CVA: continue on aspirin, statin. Holding home dose of plavix while on IV heparin      DM2: HbA1c 6.2, well controlled. Continue on SSI w/ accuchecks     Likely ACD: H&H are stable. Will transfuse if Hb < 7.0      DVT prophylaxis: IV heparin  Code Status: full  Family Communication: discussed pt's care w/ pt's  sister, [name], and answered her questions Disposition Plan: depends on PT/OT recs (not consulted yet)  Level of care: Med-Surg Status is: Inpatient Remains inpatient appropriate because: severity of illness, will go angio on 05/26/23 likely     Consultants:  ICU Vasc surg Podiatry   Procedures:   Antimicrobials:  vanco    Subjective: Pt c/o malaise   Objective: Vitals:   05/23/23 0600  05/23/23 0657 05/23/23 1444 05/24/23 0015  BP: (!) 112/56 (!) 123/59 122/79 (!) 114/56  Pulse: 84 75 92 82  Resp: 18 18 18 18   Temp:  98.3 F (36.8 C) 97.8 F (36.6 C) 98.2 F (36.8 C)  TempSrc:  Oral    SpO2: 93% 95% 96% 96%  Weight:      Height:        Intake/Output Summary (Last 24 hours) at 05/24/2023 0748 Last data filed at 05/24/2023 0639 Gross per 24 hour  Intake 2947.37 ml  Output 1200 ml  Net 1747.37 ml   Filed Weights   05/18/23 1138 05/19/23 2354 05/20/23 1444  Weight: 64.4 kg 64.4 kg 64.4 kg    Examination:  General exam: Appears comfortable  Respiratory system:  clear breath sounds b/l  Cardiovascular system: S1/S2+. No rubs or clicks   Gastrointestinal system: abd is soft, NT, ND & hypoactive bowel sounds  Central nervous system: alert & oriented  Psychiatry: Judgement and insight appears at baseline. Flat mood and affect     Data Reviewed: I have personally reviewed following labs and imaging studies  CBC: Recent Labs  Lab 05/18/23 1148 05/19/23 0606 05/20/23 2258 05/21/23 0524 05/22/23 0420 05/23/23 0529 05/24/23 0437  WBC 11.4*   < > 8.9 15.5* 14.7* 10.2 8.2  NEUTROABS 7.4  --  8.4*  --   --   --   --   HGB 10.6*   < > 10.1* 8.8* 8.8* 7.9* 7.9*  HCT 33.5*   < > 31.7* 27.0* 27.1* 24.4* 23.7*  MCV 93.8   < > 93.8 93.1 92.2 91.0 90.8  PLT 274   < > 218 208 190 179 169   < > = values in this interval not displayed.   Basic Metabolic Panel: Recent Labs  Lab 05/20/23 0710 05/21/23 0524 05/22/23 0420 05/23/23 0529 05/24/23 0437  NA 140 138 135 136 133*  K 4.3 3.5 3.3* 3.9 3.4*  CL 111 111 108 109 105  CO2 20* 17* 20* 20* 20*  GLUCOSE 118* 125* 141* 120* 180*  BUN 25* 27* 28* 20 19  CREATININE 1.12 1.53* 1.39*  1.41* 1.29* 1.34*  CALCIUM 8.9 8.7* 8.5* 8.5* 8.2*  MG 2.0 1.3*  --  1.6* 2.1   GFR: Estimated Creatinine Clearance: 46.7 mL/min (A) (by C-G formula based on SCr of 1.34 mg/dL (H)). Liver Function Tests: Recent Labs  Lab  05/18/23 1148  AST 19  ALT 11  ALKPHOS 68  BILITOT 0.1*  PROT 7.6  ALBUMIN 3.8   No results for input(s): "LIPASE", "AMYLASE" in the last 168 hours. No results for input(s): "AMMONIA" in the last 168 hours. Coagulation Profile: Recent Labs  Lab 05/18/23 1521  INR 1.1   Cardiac Enzymes: No results for input(s): "CKTOTAL", "CKMB", "CKMBINDEX", "TROPONINI" in the last 168 hours. BNP (last 3 results) No results for input(s): "PROBNP" in the last 8760 hours. HbA1C: No results for input(s): "HGBA1C" in the last 72 hours. CBG: Recent Labs  Lab 05/23/23 0831 05/23/23 1234 05/23/23 1701 05/23/23 1728 05/23/23 2212  GLUCAP 115* 167* 125*  116* 149*   Lipid Profile: No results for input(s): "CHOL", "HDL", "LDLCALC", "TRIG", "CHOLHDL", "LDLDIRECT" in the last 72 hours. Thyroid Function Tests: No results for input(s): "TSH", "T4TOTAL", "FREET4", "T3FREE", "THYROIDAB" in the last 72 hours. Anemia Panel: No results for input(s): "VITAMINB12", "FOLATE", "FERRITIN", "TIBC", "IRON", "RETICCTPCT" in the last 72 hours. Sepsis Labs: Recent Labs  Lab 05/20/23 2152 05/20/23 2353  LATICACIDVEN 2.2* 1.6    Recent Results (from the past 240 hour(s))  Culture, blood (Routine X 2) w Reflex to ID Panel     Status: None (Preliminary result)   Collection Time: 05/20/23 10:58 PM   Specimen: BLOOD  Result Value Ref Range Status   Specimen Description BLOOD BLOOD RIGHT ARM  Final   Special Requests   Final    BOTTLES DRAWN AEROBIC AND ANAEROBIC Blood Culture adequate volume   Culture   Final    NO GROWTH 4 DAYS Performed at [facility], [address], [place], [place] [address]    Report Status PENDING  Incomplete  Culture, blood (Routine X 2) w Reflex to ID Panel     Status: None (Preliminary result)   Collection Time: 05/20/23 11:53 PM   Specimen: BLOOD  Result Value Ref Range Status   Specimen Description BLOOD BLOOD LEFT ARM  Final   Special Requests   Final    BOTTLES  DRAWN AEROBIC AND ANAEROBIC Blood Culture adequate volume   Culture   Final    NO GROWTH 3 DAYS Performed at [facility], [address] [place], [place] [address]    Report Status PENDING  Incomplete  Aerobic/Anaerobic Culture w Gram Stain (surgical/deep wound)     Status: None (Preliminary result)   Collection Time: 05/21/23  9:13 AM   Specimen: Bone; Tissue  Result Value Ref Range Status   Specimen Description   Final    BONE Performed at [facility], [address] [place], [place] [address]    Special Requests   Final    NONE Performed at [facility], [address] [place], [place] [address]    Gram Stain   Final    MODERATE WBC PRESENT, PREDOMINANTLY PMN RARE GRAM POSITIVE COCCI RARE GRAM POSITIVE RODS CRITICAL RESULT CALLED TO, READ BACK BY AND VERIFIED WITH: RN Georgian Co [id number] AT 1457 BY EC Performed at [facility], [address] [place], [place] [address]    Culture   Final    FEW STAPHYLOCOCCUS LUGDUNENSIS NO ANAEROBES ISOLATED; CULTURE IN PROGRESS FOR 5 DAYS    Report Status PENDING  Incomplete   Organism ID, Bacteria STAPHYLOCOCCUS LUGDUNENSIS  Final      Susceptibility   Staphylococcus lugdunensis - MIC*    CIPROFLOXACIN <=0.5 SENSITIVE Sensitive     ERYTHROMYCIN <=0.25 SENSITIVE Sensitive     GENTAMICIN <=0.5 SENSITIVE Sensitive     OXACILLIN >=4 RESISTANT Resistant     TETRACYCLINE <=1 SENSITIVE Sensitive     VANCOMYCIN <=0.5 SENSITIVE Sensitive     TRIMETH/SULFA <=10 SENSITIVE Sensitive     CLINDAMYCIN <=0.25 SENSITIVE Sensitive     RIFAMPIN <=0.5 SENSITIVE Sensitive     Inducible Clindamycin NEGATIVE Sensitive     * FEW STAPHYLOCOCCUS LUGDUNENSIS         Radiology Studies: No results found.      Scheduled Meds:  vitamin C  500 mg Oral BID   aspirin EC  81 mg Oral Daily   Chlorhexidine Gluconate Cloth  6 each Topical Daily  cyanocobalamin  1,000 mcg Oral Daily   feeding supplement  237  mL Oral TID BM   folic acid  1 mg Oral Daily   insulin aspart  0-15 Units Subcutaneous TID WC   insulin aspart  0-5 Units Subcutaneous QHS   latanoprost  1 drop Both Eyes QHS   multivitamin with minerals  1 tablet Oral Daily   polyethylene glycol  17 g Oral Daily   potassium chloride  40 mEq Oral Once   rosuvastatin  40 mg Oral QHS   sertraline  100 mg Oral Daily   zinc sulfate  220 mg Oral Daily   Continuous Infusions:  sodium chloride Stopped (05/21/23 0815)   ceFEPime (MAXIPIME) IV Stopped (05/24/23 0603)   heparin 1,800 Units/hr (05/24/23 1610)   lactated ringers 100 mL/hr at 05/24/23 0639   vancomycin 1,000 mg (05/23/23 1140)     LOS: 6 days    Time spent: 25 mins     Charise Killian, MD [facility] Pager [phone]  If 7PM-7AM, please contact night-coverage [email or web address] 05/24/2023, 7:48 AM
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
## 2023-05-24 NOTE — TOC Initial Note (Signed)
Transition of Care [facility]) - Initial/Assessment Note    Patient Details  Name: Jordan Arellano MRN: [id number] Date of Birth: [birth year]  [facility]) CM/SW Contact:    Garret Reddish, RN Phone Number: 05/24/2023, 8:49 PM  Clinical Narrative:  Chart reviewed.  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  Patient is also a BKA.  Vascular is currently following patient.    I have spoke with patient today.  Jordan Arellano reports that prior to admission he lives at home by himself.  He reports that prior to admission he was able to bath and dress himself.  He informs me that he has a prothesis for his right stump area.  He is able to get around his home with a 2 wheeled rolling walker.    Jordan Arellano reports that his sister Jordan Arellano assist with his meals and he can also cook if he has too. Jordan Arellano reports that Jordan Arellano takes him to his medical appointments. Patient reports that he receives his prescription medications via mail order.  He reports that his PCP was Mort Sawyers.    I have spoken to Jordan Arellano about discharge planning.  He informs me that on discharge he would like to return home.  He reports that he currently has home health services but he can not remember what agency comes in.  I have also spoken to him about [facility] on discharge.  Jordan Arellano reports that he went to [facility] in early this year when he fall and broke his hip.  He informs me that he would prefer to go home on discharge.  He reports that Jordan Arellano and his son could assist him on discharge if needed.  Patient was able to answer all of my questions appropriately.    TOC will continue to follow for discharge planning.     Expected Discharge Plan: Home w Home Health Services (Home with Home Health v/s SNF) Barriers to Discharge: No Barriers Identified   Patient Goals and CMS Choice            Expected Discharge Plan and Services   Discharge Planning Services: CM  Consult Post Acute Care Choice: Home Health Living arrangements for the past 2 months: Single Family Home                                      Prior Living Arrangements/Services Living arrangements for the past 2 months: Single Family Home Lives with:: Self Patient language and need for interpreter reviewed:: Yes Do you feel safe going back to the place where you live?: Yes          Current home services: DME (Patient has a 2 wheeled rolling walker, and cane at home.)    Activities of Daily Living Home Assistive Devices/Equipment: Prosthesis, Hearing aid, Wheelchair ADL Screening (condition at time of admission) Patient's cognitive ability adequate to safely complete daily activities?: Yes Is the patient deaf or have difficulty hearing?: No Does the patient have difficulty seeing, even when wearing glasses/contacts?: No Does the patient have difficulty concentrating, remembering, or making decisions?: No Patient able to express need for assistance with ADLs?: Yes Does the patient have difficulty dressing or bathing?: No Independently performs ADLs?: Yes (appropriate for developmental age) Does the patient have difficulty walking or climbing stairs?: Yes Weakness of Legs: Left Weakness of Arms/Hands: None  Permission  Sought/Granted   Permission granted to share information with : Yes, Verbal Permission Granted              Emotional Assessment       Orientation: : Oriented to Self, Oriented to Place, Oriented to  Time, Oriented to Situation Alcohol / Substance Use: Tobacco Use (Patient smoke cigarettes)    Admission diagnosis:  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) [I70.25, L97.509] Patient Active Problem List   Diagnosis Date Noted   Protein-calorie malnutrition, severe 05/24/2023   Acute hematogenous osteomyelitis of left foot (HCC) 05/19/2023   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)  05/18/2023   Heart failure with preserved ejection fraction (HCC) 05/18/2023   Encounter for tobacco use cessation counseling 05/18/2023   At risk for constipation 05/18/2023   Mild cognitive impairment 04/25/2023   Malnutrition of mild degree (HCC) 04/18/2023   Dyslipidemia 03/27/2023   Depression 03/27/2023   Type 2 diabetes mellitus without complications (HCC) 03/27/2023   At high risk for infection 01/19/2023   Conductive hearing loss of right ear 08/19/2022   Left renal artery stenosis (HCC) 06/27/2020   Recurrent major depressive disorder, in full remission (HCC) 06/27/2020   Atherosclerosis of native arteries of extremity with rest pain (HCC) 10/01/2017   Status post femoral-popliteal bypass surgery 09/30/2017   DM (diabetes mellitus) with peripheral vascular complication (HCC) 12/19/2016   Hypertriglyceridemia 10/01/2016   S/P unilateral BKA (below knee amputation), right (HCC) 09/30/2016   Neuropathy 09/30/2016   PAD s/p right BKA, history revascularization left leg 09/30/2016   Phantom pain after amputation of lower extremity (HCC) 09/30/2016   History of CVA (cerebrovascular accident) without residual deficits 03/25/2014   Essential hypertension 03/25/2014   Tobacco abuse 03/25/2014   PCP:  Mort Sawyers, FNP Pharmacy:   [facility]) - [place], [place] - [address] [facility] [address] [place] [address] [place] [address] Phone: [phone] Fax: [phone]  [facility] - [place], [place] - [address] [place]  [address] Phone: [phone] Fax: [phone]  [id number] - [place], [place] - [address] [facility] [facility] [address] [facility] [address] [place] [address] Phone: [phone] Fax: [phone]     Social Determinants of Health (SDOH) Social History: SDOH Screenings   Food Insecurity: No Food Insecurity (05/19/2023)  Housing: Low Risk   (05/19/2023)  Transportation Needs: No Transportation Needs (05/19/2023)  Utilities: Not At Risk (05/19/2023)  Alcohol Screen: Low Risk  (04/07/2022)  Depression (PHQ2-9): Low Risk  (04/07/2022)  Financial Resource Strain: Low Risk  (10/08/2021)  Physical Activity: Insufficiently Active (04/07/2022)  Social Connections: Socially Isolated (04/07/2022)  Stress: No Stress Concern Present (04/07/2022)  Tobacco Use: High Risk (05/23/2023)   SDOH Interventions:     Readmission Risk Interventions     No data to display

## 2023-05-24 NOTE — Consult Note (Signed)
ANTICOAGULATION CONSULT NOTE   Pharmacy Consult for Heparin Infusion Indication:  Lower extremity ischemia  Patient Measurements: Height: 6\' 1"  (185.4 cm) Weight: 64.4 kg (141 lb 15.6 oz) IBW/kg (Calculated) : 79.9 Heparin Dosing Weight: 64.4 kg  Labs: Recent Labs    05/22/23 0420 05/22/23 1408 05/23/23 0529 05/23/23 0755 05/23/23 1341 05/23/23 1928 05/24/23 0437  HGB 8.8*  --  7.9*  --   --   --  7.9*  HCT 27.1*  --  24.4*  --   --   --  23.7*  PLT 190  --  179  --   --   --  169  HEPARINUNFRC 0.15*   < >  --    < > 0.12* 0.25* 0.32  CREATININE 1.39*  1.41*  --  1.29*  --   --   --  1.34*   < > = values in this interval not displayed.    Estimated Creatinine Clearance: 46.7 mL/min (A) (by C-G formula based on SCr of 1.34 mg/dL (H)).  Medical History: Past Medical History:  Diagnosis Date   Basal cell carcinoma 08/11/2022   R forearm - ED&C   Depression    Foot ulcer (HCC) 12/12/2016   Hyperlipidemia    Hypertension    PAD (peripheral artery disease) (HCC) ~2007   s/p R BKA for non-healing wound   Stroke (HCC) 03/2014   MRI: Acute nonhemorrhagic left paracentral pontine infarct. 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   TIA (transient ischemic attack) 01/2014    Medications:  No prior anticoagulation noted   Assessment: 72 year old male with history of hypertension, hyperlipidemia, depression, TIA, R BKA who presents to the emergency department for chief concerns of left foot pain and redness with swelling. Pt with history of prior lower extremity stents placed 02/2023. Pharmacy has been consulted to initiate and manage IV heparin therapy for lower extremity aterial occlusion   Goal of Therapy:  Heparin level 0.3-0.7 units/ml Monitor platelets by anticoagulation protocol: Yes   0615 0657 HL 0.32, therapeutic x 1 0616 0420 HL 0.15, subtherapeutic x 1 0616 1408 HL 0.19,   subtherapeutic 0616 2313 HL 0.29, subtherapeutic  0617 0755 HL 0.38, therapeutic x 1 0617 1341 HL 0.12, subtherapeutic 0617 1928 HL 0.25, subtherapeutic 0618 0437 HL 0.32, therapeutic X 1   Plan: 0618:  HL @ 0437 = 0.32, therapeutic X 1  - will continue pt on current rate and recheck HL in 6 hrs on 6/18 @ 1100.  --Recheck heparin level and CBC tomorrow AM  Jordan Arellano D 05/24/2023

## 2023-05-24 NOTE — Plan of Care (Signed)

## 2023-05-24 NOTE — Plan of Care (Signed)

## 2023-05-24 NOTE — Telephone Encounter (Signed)
Patient's sister called wanting our office and doctor to send the patient to a rehab facility after his procedure. The sister states the patient has stated he wanted to go home and not to a facility. I explained that as long as the patient is competent to give his wishes the providers are not able to make a decision to send the patient to a facility.

## 2023-05-24 NOTE — Consult Note (Signed)
PHARMACY CONSULT NOTE  Pharmacy Consult for Electrolyte Monitoring and Replacement   Recent Labs: Potassium (mmol/L)  Date Value  05/24/2023 3.4 (L)   Magnesium (mg/dL)  Date Value  16/09/9603 2.1   Calcium (mg/dL)  Date Value  54/08/8118 8.2 (L)   Albumin (g/dL)  Date Value  14/78/2956 3.8   Phosphorus (mg/dL)  Date Value  21/30/8657 3.6   Sodium (mmol/L)  Date Value  05/24/2023 133 (L)  02/03/2016 139   Assessment: 72 y/o M with medical history including basal cell carcinoma, anxiety / depression, PAD s/p right BKA, CVA, HTN, HLD admitted with OM left first and third toes s/p amputation and bilateral common iliac artery stenting for left lower extremity limb threatening ischemia. Pharmacy consulted to assist with electrolyte monitoring and replacement as indicated.  Goal of Therapy:  Electrolytes within normal limits  Plan:  --K 3.4  Will order KCL 40 meq po x 1 --Follow-up electrolytes with AM labs tomorrow  Angelique Blonder, PharmD 05/24/2023 7:26 AM

## 2023-05-25 DIAGNOSIS — Z1330 Encounter for screening examination for mental health and behavioral disorders, unspecified: Secondary | ICD-10-CM

## 2023-05-25 DIAGNOSIS — L97509 Non-pressure chronic ulcer of other part of unspecified foot with unspecified severity: Secondary | ICD-10-CM | POA: Diagnosis not present

## 2023-05-25 DIAGNOSIS — I7025 Atherosclerosis of native arteries of other extremities with ulceration: Secondary | ICD-10-CM | POA: Diagnosis not present

## 2023-05-25 DIAGNOSIS — M86072 Acute hematogenous osteomyelitis, left ankle and foot: Secondary | ICD-10-CM | POA: Diagnosis not present

## 2023-05-25 LAB — BASIC METABOLIC PANEL
Anion gap: 7 (ref 5–15)
BUN: 17 mg/dL (ref 8–23)
CO2: 20 mmol/L — ABNORMAL LOW (ref 22–32)
Calcium: 8 mg/dL — ABNORMAL LOW (ref 8.9–10.3)
Chloride: 109 mmol/L (ref 98–111)
Creatinine, Ser: 1.24 mg/dL (ref 0.61–1.24)
GFR, Estimated: >60 mL/min (ref >60)
Glucose, Bld: 126 mg/dL — ABNORMAL HIGH (ref 70–99)
Potassium: 3.8 mmol/L (ref 3.5–5.1)
Sodium: 136 mmol/L (ref 135–145)

## 2023-05-25 LAB — GLUCOSE, CAPILLARY
Glucose-Capillary: 119 mg/dL — ABNORMAL HIGH (ref 70–99)
Glucose-Capillary: 180 mg/dL — ABNORMAL HIGH (ref 70–99)
Glucose-Capillary: 128 mg/dL — ABNORMAL HIGH (ref 70–99)
Glucose-Capillary: 147 mg/dL — ABNORMAL HIGH (ref 70–99)

## 2023-05-25 LAB — CBC
HCT: 22.3 % — ABNORMAL LOW (ref 39.0–52.0)
Hemoglobin: 7.3 g/dL — ABNORMAL LOW (ref 13.0–17.0)
MCH: 29.9 pg (ref 26.0–34.0)
MCHC: 32.7 g/dL (ref 30.0–36.0)
MCV: 91.4 fL (ref 80.0–100.0)
Platelets: 161 10*3/uL (ref 150–400)
RBC: 2.44 MIL/uL — ABNORMAL LOW (ref 4.22–5.81)
RDW: 17.4 % — ABNORMAL HIGH (ref 11.5–15.5)
WBC: 9.9 10*3/uL (ref 4.0–10.5)
nRBC: 0 % (ref 0.0–0.2)

## 2023-05-25 LAB — AEROBIC/ANAEROBIC CULTURE W GRAM STAIN (SURGICAL/DEEP WOUND)

## 2023-05-25 LAB — MAGNESIUM: Magnesium: 1.8 mg/dL (ref 1.7–2.4)

## 2023-05-25 LAB — HEPARIN LEVEL (UNFRACTIONATED)
Heparin Unfractionated: 0.26 IU/mL — ABNORMAL LOW (ref 0.30–0.70)
Heparin Unfractionated: 0.4 IU/mL (ref 0.30–0.70)
Heparin Unfractionated: 0.36 IU/mL (ref 0.30–0.70)

## 2023-05-25 LAB — CULTURE, BLOOD (ROUTINE X 2)

## 2023-05-25 LAB — SURGICAL PATHOLOGY

## 2023-05-25 MED ORDER — HEPARIN BOLUS VIA INFUSION
1000.0000 [IU] | Freq: Once | INTRAVENOUS | Status: AC
  Administered 2023-05-25: 1000 [IU] via INTRAVENOUS
  Filled 2023-05-25: qty 1000

## 2023-05-25 MED ORDER — MAGNESIUM SULFATE 2 GM/50ML IV SOLN
2.0000 g | Freq: Once | INTRAVENOUS | Status: AC
  Administered 2023-05-25: 2 g via INTRAVENOUS
  Filled 2023-05-25: qty 50

## 2023-05-25 NOTE — H&P (View-Only) (Signed)
Progress Note    05/25/2023 11:05 PM * No surgery date entered *  Subjective:  Jordan Arellano is a 72 y.o. 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  He does not yet have inline flow to heal his left lower extremity wounds.   On exam today the patient was resting comfortably in bed. He has no complaints. We discussed in detail the plan to go back to the vascular lab tomorrow to access his left lower extremity from below his groin now that he has stents placed in both his iliac arteries to  attempt to open up blood flow to his foot. Patients vitals all remain stable. Patient verbalizes his understanding.    Vitals:   05/25/23 1730 05/25/23 2134  BP: 117/66 (!) 120/58  Pulse: 73 75  Resp: 16 20  Temp: 98.2 F (36.8 C) 98 F (36.7 C)  SpO2: 97% 94%   Physical Exam: Cardiac:  RRR normal S1, S2. No rubs, clicks or gallup's.  Lungs:  Clear on auscultation throughout, normal respiratory effort.  Incisions:  Right groin with dressing clean dry and intact. No hematoma, seroma or infection to note.  Extremities:  Right prior BKA, left lower extremity unable to palpate pulses but remains warm to touch. Abdomen:  Positive bowel sounds throughout, soft, flat, non tender and non distended.  Neurologic: AAOX3 and follows all commands. Answers all questions appropriately.   CBC    Component Value Date/Time   WBC 9.9 05/25/2023 0433   RBC 2.44 (L) 05/25/2023 0433   HGB 7.3 (L) 05/25/2023 0433   HCT 22.3 (L) 05/25/2023 0433   PLT 161 05/25/2023 0433   MCV 91.4 05/25/2023 0433   MCH 29.9 05/25/2023 0433   MCHC 32.7 05/25/2023 0433   RDW 17.4 (H) 05/25/2023 0433   LYMPHSABS 0.3 (L) 05/20/2023 2258   MONOABS 0.1 05/20/2023 2258   EOSABS 0.1 05/20/2023 2258   BASOSABS 0.0 05/20/2023 2258    BMET    Component Value Date/Time   NA 136 05/25/2023 0433   NA 139 02/03/2016 0000   K 3.8  05/25/2023 0433   CL 109 05/25/2023 0433   CO2 20 (L) 05/25/2023 0433   GLUCOSE 126 (H) 05/25/2023 0433   BUN 17 05/25/2023 0433   BUN 28 (A) 02/03/2016 0000   CREATININE 1.24 05/25/2023 0433   CALCIUM 8.0 (L) 05/25/2023 0433   GFRNONAA >60 05/25/2023 0433   GFRAA >60 10/02/2017 0943    INR    Component Value Date/Time   INR 1.1 05/18/2023 1521     Intake/Output Summary (Last 24 hours) at 05/25/2023 2305 Last data filed at 05/25/2023 1700 Gross per 24 hour  Intake --  Output 2150 ml  Net -2150 ml     Assessment/Plan:  72 y.o. male is s/p Aortogram with selective left lower extremity angiogram with bilateral iliac stent placement.  * No surgery date entered *   PLAN: Vascular surgery plans on taking the patient back to the vascular lab tomorrow 05/26/2023 for left lower extremity selective angiogram from a posterior approach. I discussed in detail with the patient the procedure, benefits, risks and complications. He verbalized his understanding. I answered all the patients questions today. Patient will be made NPO after midnight for procedure tomorrow 05/26/2023.   DVT prophylaxis:  Heparin Infusion  I discussed in detail the plan with Dr Jason Dew MD and he agrees with the plan.    Carianne Taira   [facility] 05/25/2023 11:05 PM   

## 2023-05-25 NOTE — Progress Notes (Signed)
Pharmacy Antibiotic Note  Jordan Arellano is a 72 y.o. 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  Pharmacy has been consulted for vancomycin and cefepime dosing. Patient presented with left foot pain and redness with swelling. On presentation the patient stated having a left foot wound lasting 3-4 days. Patient has a history of PAD with previous revascularization. WBC have improved since starting ABX.  6/12 - Scr 1.24 - near baseline. Xray of foot: "Erosions are seen in the tip of distal phalanx of left big toe" - MRI has been ordered. 6/13 - Scr 1.11 - MRI showed a small ulceration at the distal aspect of the great toe with acute osteomyelitis of the great toe distal phalanx and acute osteomyelitis of the distal phalanx of the third toe. 6/14: Angio today with possible amputation on Saturday, renal function stable 6/15 Scr increase 1.12>>1.53.  s/p toe amputations today 6/16 Scr 1.53 >> 1.41  Bone cx: rare GPC, GPR Random Vanc level 6/16@0420 =14 mcg/ml 6/19 Scr 1.24  Continue Vancomycin 1 gm q24h  Bone cx: Staph lugdunensis, rare diphtheroids(corynebacterium spp)  Plan: Day # 7 -continue Vancomycin 1000 mg IV q24h Goal AUC 400-550. Expected AUC: 465 SCr used: 1.24 Cmin 11.1 TBW<IBW   F/U renal fxn, cultures  Height: 6\' 1"  (185.4 cm) Weight: 64.4 kg (141 lb 15.6 oz) IBW/kg (Calculated) : 79.9  Temp (24hrs), Avg:99.1 F (37.3 C), Min:97.9 F (36.6 C), Max:99.9 F (37.7 C)  Recent Labs  Lab 05/20/23 2152 05/20/23 2258 05/20/23 2353 05/21/23 0524 05/22/23 0420 05/23/23 0529 05/24/23 0437 05/25/23 0433  WBC  --    < >  --  15.5* 14.7* 10.2 8.2 9.9  CREATININE  --   --   --  1.53* 1.39*  1.41* 1.29* 1.34* 1.24  LATICACIDVEN 2.2*  --  1.6  --   --   --   --   --   VANCORANDOM  --   --   --   --  14  --   --   --    < > = values in this interval not displayed.     Estimated Creatinine Clearance: 50.5 mL/min (by C-G formula based on  SCr of 1.24 mg/dL).    No Known Allergies  Antimicrobials this admission: Ceftriaxone x 1  Vancomycin 6/12 >>  Cefepime 6/12 >>6/18  Dose adjustments this admission: Vanocmycin 1000 mg Q24h >> 1250 mg Q24h>> variable dosing 6/15 >> 1000mg  q24h 6/16  Microbiology results: Bone cx 6/15:  FEW STAPHYLOCOCCUS LUGDUNENSIS.  RARE DIPHTHEROIDS(CORYNEBACTERIUM SPECIES)  NO ANAEROBES ISOLATED;   Thank you for allowing pharmacy to be a part of this patient's care.  Bari Mantis PharmD Clinical Pharmacist 05/25/2023

## 2023-05-25 NOTE — Consult Note (Signed)
ANTICOAGULATION CONSULT NOTE   Pharmacy Consult for Heparin Infusion Indication:  Lower extremity ischemia  Patient Measurements: Height: 6\' 1"  (185.4 cm) Weight: 64.4 kg (141 lb 15.6 oz) IBW/kg (Calculated) : 79.9 Heparin Dosing Weight: 64.4 kg  Labs: Recent Labs    05/23/23 0529 05/23/23 0755 05/24/23 0437 05/24/23 1100 05/25/23 0433  HGB 7.9*  --  7.9*  --  7.3*  HCT 24.4*  --  23.7*  --  22.3*  PLT 179  --  169  --  161  HEPARINUNFRC  --    < > 0.32 0.38 0.26*  CREATININE 1.29*  --  1.34*  --  1.24   < > = values in this interval not displayed.    Estimated Creatinine Clearance: 50.5 mL/min (by C-G formula based on SCr of 1.24 mg/dL).  Medical History: Past Medical History:  Diagnosis Date   Basal cell carcinoma 08/11/2022   R forearm - ED&C   Depression    Foot ulcer (HCC) 12/12/2016   Hyperlipidemia    Hypertension    PAD (peripheral artery disease) (HCC) ~2007   s/p R BKA for non-healing wound   Stroke (HCC) 03/2014   MRI: Acute nonhemorrhagic left paracentral pontine infarct. 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   TIA (transient ischemic attack) 01/2014    Medications:  No prior anticoagulation noted   Assessment: 72 year old male with history of hypertension, hyperlipidemia, depression, TIA, R BKA who presents to the emergency department for chief concerns of left foot pain and redness with swelling. Pt with history of prior lower extremity stents placed 02/2023. Pharmacy has been consulted to initiate and manage IV heparin therapy for lower extremity aterial occlusion   Goal of Therapy:  Heparin level 0.3-0.7 units/ml Monitor platelets by anticoagulation protocol: Yes   0615 0657 HL 0.32, therapeutic x 1 0616 0420 HL 0.15, subtherapeutic x 1 0616 1408 HL 0.19,  subtherapeutic 0616 2313 HL 0.29, subtherapeutic  0617 0755 HL 0.38, therapeutic x 1 0617 1341 HL 0.12,  subtherapeutic 0617 1928 HL 0.25, subtherapeutic 0618 0437 HL 0.32, therapeutic X 1  0618 1100 HL 0.38, therapeutic x 2 0619 0433 HL 0.26, SUBtherapeutic   Plan: 6/19:  HL @ 0433 = 0.26, SUBtherapeutic - Will order heparin 1000 units IV X 1 bolus and increase drip rate to 1900 units/hr.  - Will recheck HL 6 hrs after rate change.  --CBC daily   Tyger Wichman D Clinical Pharmacist 05/25/2023

## 2023-05-25 NOTE — Evaluation (Signed)
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 pmh of CVA, HTN, tobacco use, PAD, hypergliceridemia, left renal stenosis, DLD, depression and heart failure.  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 Pt performs bed mobility independently and transfers with supervision. Ambulation not attempted due  to patient not having the orthotic shoe needed. Patient donned and doffed prosthesis independently, stated that he was being fitted for a new one before admission. Performed transfer from bed to chair with a lateral scoot transfer, attempted with transfer board but patient did not like it. Discussed precautions with patient who verbalized/demonstrated understanding of precautions. Pt demonstrates deficits with balance, mobility, safe use of DME, strength and ROM. Left patient in chair with bell near and alarm set. Communicated with nursing mobility status and need for orthotic shoe.  Pt will continue to receive skilled PT services while admitted and will defer to TOC/care team for updates regarding disposition planning.        Recommendations for follow up therapy are one component of a multi-disciplinary discharge planning process, led by the attending physician.  Recommendations may be updated based on patient status, additional functional criteria and insurance authorization.  Follow Up Recommendations       Assistance Recommended at Discharge Intermittent Supervision/Assistance  Patient can return home with the following  A little help with walking and/or transfers;Assistance with cooking/housework;Assist for transportation;Help with stairs or  ramp for entrance    Equipment Recommendations Rolling walker (2 wheels)  Recommendations for Other Services       Functional Status Assessment Patient has had a recent decline in their functional status and demonstrates the ability to make significant improvements in function in a reasonable and predictable amount of time.     Precautions / Restrictions Precautions Precautions: Fall Restrictions Weight Bearing Restrictions: Yes LLE Weight Bearing: Partial weight bearing LLE Partial Weight Bearing Percentage or Pounds: 50 Other Position/Activity Restrictions: surgical shoe needed to PWB on LLE.      Mobility  Bed Mobility Overal bed mobility: Needs Assistance Bed Mobility: Supine to Sit     Supine to sit: Independent     General bed mobility comments: patient able to sit up from flat bed and scoot in bed independently.    Transfers Overall transfer level: Needs assistance Equipment used: None Transfers: Bed to chair/wheelchair/BSC            Lateral/Scoot Transfers: Supervision General transfer comment: Patient able to perform lateral scoot transfer from bed to chair. attempted with transfer board but patient didn't like it. able to maintain WBing precautions during transfer with use of prosthetic. Donned/doffed prosthesis independently.    Ambulation/Gait               General Gait Details: Unable to asses due to lack of orthotic shoe.  Stairs            Wheelchair Mobility    Modified Rankin (Stroke Patients Only)       Balance Overall balance assessment: Needs assistance Sitting-balance support: No upper extremity supported, Feet supported Sitting balance-Leahy Scale: Normal Sitting balance - Comments: Sitting EOB to use urinal with LLE on ground.  Pertinent Vitals/Pain Pain Assessment Pain Assessment: No/denies pain    Home Living Family/patient expects to be discharged to:: Private  residence Living Arrangements: Alone Available Help at Discharge: Family;Available PRN/intermittently Type of Home: House Home Access: Stairs to enter Entrance Stairs-Rails: Doctor, general practice of Steps: 5   Home Layout: One level Home Equipment: Cane - single point      Prior Function Prior Level of Function : Needs assist       Physical Assist : ADLs (physical)   ADLs (physical): IADLs Mobility Comments: Mod-I with SPC and R prosthetic in community. No AD use at home. ADLs Comments: Pt reports independent of ADLs, sister/son help with groceries and getting to appointments.     Hand Dominance   Dominant Hand: Right    Extremity/Trunk Assessment   Upper Extremity Assessment Upper Extremity Assessment: Overall WFL for tasks assessed    Lower Extremity Assessment Lower Extremity Assessment: Overall WFL for tasks assessed       Communication   Communication: No difficulties  Cognition Arousal/Alertness: Awake/alert Behavior During Therapy: WFL for tasks assessed/performed Overall Cognitive Status: Within Functional Limits for tasks assessed                                          General Comments      Exercises     Assessment/Plan    PT Assessment Patient needs continued PT services  PT Problem List Decreased range of motion;Decreased balance;Decreased mobility;Decreased coordination       PT Treatment Interventions DME instruction;Gait training;Stair training;Functional mobility training;Therapeutic activities;Therapeutic exercise;Balance training;Patient/family education    PT Goals (Current goals can be found in the Care Plan section)  Acute Rehab PT Goals Patient Stated Goal: to go home PT Goal Formulation: With patient Time For Goal Achievement: 06/08/23 Potential to Achieve Goals: Good    Frequency Min 3X/week     Co-evaluation               AM-PAC PT "6 Clicks" Mobility  Outcome Measure Help needed  turning from your back to your side while in a flat bed without using bedrails?: None Help needed moving from lying on your back to sitting on the side of a flat bed without using bedrails?: None Help needed moving to and from a bed to a chair (including a wheelchair)?: A Little Help needed standing up from a chair using your arms (e.g., wheelchair or bedside chair)?: A Little Help needed to walk in hospital room?: A Little Help needed climbing 3-5 steps with a railing? : A Lot 6 Click Score: 19    End of Session   Activity Tolerance: Patient tolerated treatment well Patient left: in chair;with call bell/phone within reach;with chair alarm set Nurse Communication: Mobility status;Weight bearing status PT Visit Diagnosis: Unsteadiness on feet (R26.81);Other abnormalities of gait and mobility (R26.89);Difficulty in walking, not elsewhere classified (R26.2);History of falling (Z91.81)    Time: 4098-1191 PT Time Calculation (min) (ACUTE ONLY): 31 min   Charges:              Malachi Carl, SPT   Malachi Carl 05/25/2023, 4:54 PM

## 2023-05-25 NOTE — Consult Note (Signed)
ANTICOAGULATION CONSULT NOTE   Pharmacy Consult for Heparin Infusion Indication:  Lower extremity ischemia  Patient Measurements: Height: 6\' 1"  (185.4 cm) Weight: 64.4 kg (141 lb 15.6 oz) IBW/kg (Calculated) : 79.9 Heparin Dosing Weight: 64.4 kg  Labs: Recent Labs    05/23/23 0529 05/23/23 0755 05/24/23 0437 05/24/23 1100 05/25/23 0433 05/25/23 1233 05/25/23 1802  HGB 7.9*  --  7.9*  --  7.3*  --   --   HCT 24.4*  --  23.7*  --  22.3*  --   --   PLT 179  --  169  --  161  --   --   HEPARINUNFRC  --    < > 0.32   < > 0.26* 0.40 0.36  CREATININE 1.29*  --  1.34*  --  1.24  --   --    < > = values in this interval not displayed.    Estimated Creatinine Clearance: 50.5 mL/min (by C-G formula based on SCr of 1.24 mg/dL).  Medical History: Past Medical History:  Diagnosis Date   Basal cell carcinoma 08/11/2022   R forearm - ED&C   Depression    Foot ulcer (HCC) 12/12/2016   Hyperlipidemia    Hypertension    PAD (peripheral artery disease) (HCC) ~2007   s/p R BKA for non-healing wound   Stroke (HCC) 03/2014   MRI: Acute nonhemorrhagic left paracentral pontine infarct. 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   TIA (transient ischemic attack) 01/2014    Medications:  No prior anticoagulation noted   Assessment: 72 year old male with history of hypertension, hyperlipidemia, depression, TIA, R BKA who presents to the emergency department for chief concerns of left foot pain and redness with swelling. Pt with history of prior lower extremity stents placed 02/2023. Pharmacy has been consulted to initiate and manage IV heparin therapy for lower extremity aterial occlusion   Goal of Therapy:  Heparin level 0.3-0.7 units/ml Monitor platelets by anticoagulation protocol: Yes   0615 0657 HL 0.32, therapeutic x 1 0616 0420 HL 0.15, subtherapeutic x 1 0616 1408 HL 0.19,  subtherapeutic 0616 2313 HL  0.29, subtherapeutic  0617 0755 HL 0.38, therapeutic x 1 0617 1341 HL 0.12, subtherapeutic 0617 1928 HL 0.25, subtherapeutic 0618 0437 HL 0.32, therapeutic X 1  0618 1100 HL 0.38, therapeutic x 2 0619 0433 HL 0.26, SUBtherapeutic  0619 1233 HL 0.40, therapeutic x 1 0619 1802 HL 0.36, therapeutic x 2  Plan: --Heparin level is therapeutic x 2 --Continue heparin infusion at 1900 units/hr --Re-check HL / CBC tomorrow AM  Tressie Ellis 05/25/2023

## 2023-05-25 NOTE — Progress Notes (Signed)
Progress Note    Jordan Arellano  ZOX:[id number] DOB: 02/18/1951  DOA: 05/18/2023 PCP: Mort Sawyers, FNP      Brief Narrative:    Medical records reviewed and are as summarized below:  Jordan Arellano is a 72 y.o. male hypertension, hyperlipidemia, depression, TIA, stroke, PAD, presented to the hospital with left foot pain associated with redness and swelling.  He was found to have osteomyelitis of the left first and third toes.  He was treated with empiric IV antibiotics.  He underwent stent placement to right common iliac artery and right external iliac artery on 05/20/2023.  He also underwent amputation of left great toe and amputation of left third toe.  He developed persistent hypotension which did not respond to IV fluids.  This necessitated transfer to the ICU for vasopressors for septic shock.        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) Active Problems:   Essential hypertension   Dyslipidemia   Depression   History of CVA (cerebrovascular accident) without residual deficits   Tobacco abuse   PAD s/p right BKA, history revascularization left leg   Hypertriglyceridemia   Status post femoral-popliteal bypass surgery   Left renal artery stenosis (HCC)   Recurrent major depressive disorder, in full remission (HCC)   Heart failure with preserved ejection fraction (HCC)   Encounter for tobacco use cessation counseling   At risk for constipation   Acute hematogenous osteomyelitis of left foot (HCC)   Protein-calorie malnutrition, severe   Nutrition Problem: Severe Malnutrition Etiology: social / environmental circumstances  Signs/Symptoms: mild fat depletion, moderate fat depletion, moderate muscle depletion, severe muscle depletion, percent weight loss Percent weight loss: 8.4 %   Body mass index is 18.73 kg/m.   Septic shock secondary to acute osteomyelitis of left first and third  toes,? septic arthritis left great toe IP joint: Sepsis physiology has resolved. Left foot culture wound showed Staphylococcus lugdunensis.  No growth on blood culture.  Continue empiric IV vancomycin.   PAD, ischemic limb of left lower extremity: Continue IV heparin drip.  Monitor heparin level per protocol.  Plan for angiogram tomorrow.  Follow-up with vascular surgeon.  S/p stent placement to right common iliac artery and right external iliac artery on 05/20/2023   Anemia of chronic disease: H&H is stable.  No indication for blood transfusion at this time.   Type II DM: NovoLog as needed for hyperglycemia.   History of stroke: Continue aspirin.  Plavix on hold.   Other comorbidities include hypertension, hyperlipidemia   Diet Order             Diet regular Fluid consistency: Thin  Diet effective now                            Consultants: Vascular surgeon Intensivist Podiatrist  Procedures: Amputation left great toe at the IPJ, amputation left third toe metatarsophalangeal joint on 05/21/2023 Stent placement to right common iliac artery and right external iliac artery on 05/20/2023   Medications:    vitamin C  500 mg Oral BID   aspirin EC  81 mg Oral Daily   Chlorhexidine Gluconate Cloth  6 each Topical Daily   cyanocobalamin  1,000 mcg Oral Daily   feeding supplement  237 mL Oral TID BM   folic acid  1 mg Oral Daily   insulin aspart  0-15 Units  Subcutaneous TID WC   insulin aspart  0-5 Units Subcutaneous QHS   latanoprost  1 drop Both Eyes QHS   multivitamin with minerals  1 tablet Oral Daily   polyethylene glycol  17 g Oral Daily   rosuvastatin  40 mg Oral QHS   sertraline  100 mg Oral Daily   zinc sulfate  220 mg Oral Daily   Continuous Infusions:  sodium chloride Stopped (05/21/23 0815)   heparin 1,900 Units/hr (05/25/23 1610)   lactated ringers 100 mL/hr at 05/25/23 0908   vancomycin 1,000 mg (05/25/23 1055)     Anti-infectives (From  admission, onward)    Start     Dose/Rate Route Frequency Ordered Stop   05/22/23 0830  vancomycin (VANCOCIN) IVPB 1000 mg/200 mL premix        1,000 mg 200 mL/hr over 60 Minutes Intravenous Every 24 hours 05/22/23 0734     05/21/23 1032  vancomycin variable dose per unstable renal function (pharmacist dosing)  Status:  Discontinued         Does not apply See admin instructions 05/21/23 1032 05/22/23 0734   05/20/23 1400  ceFAZolin (ANCEF) IVPB 2g/100 mL premix        2 g 200 mL/hr over 30 Minutes Intravenous 30 min pre-op 05/20/23 1400 05/20/23 1552   05/19/23 2000  vancomycin (VANCOCIN) IVPB 1000 mg/200 mL premix  Status:  Discontinued        1,000 mg 200 mL/hr over 60 Minutes Intravenous Every 24 hours 05/18/23 1827 05/19/23 1034   05/19/23 2000  vancomycin (VANCOREADY) IVPB 1250 mg/250 mL  Status:  Discontinued        1,250 mg 166.7 mL/hr over 90 Minutes Intravenous Every 24 hours 05/19/23 1034 05/21/23 1032   05/18/23 1900  vancomycin (VANCOREADY) IVPB 1500 mg/300 mL        1,500 mg 150 mL/hr over 120 Minutes Intravenous  Once 05/18/23 1827 05/18/23 2120   05/18/23 1830  ceFEPIme (MAXIPIME) 2 g in sodium chloride 0.9 % 100 mL IVPB  Status:  Discontinued        2 g 200 mL/hr over 30 Minutes Intravenous Every 12 hours 05/18/23 1827 05/24/23 1122   05/18/23 1400  cefTRIAXone (ROCEPHIN) 2 g in sodium chloride 0.9 % 100 mL IVPB        2 g 200 mL/hr over 30 Minutes Intravenous  Once 05/18/23 1347 05/18/23 1438              Family Communication/Anticipated D/C date and plan/Code Status   DVT prophylaxis: Place TED hose Start: 05/18/23 1434     Code Status: Full Code  Family Communication: None Disposition Plan: Plan to discharge home when cleared by vascular surgeon/podiatrist   Status is: Inpatient Remains inpatient appropriate because: Left foot osteomyelitis on IV antibiotics       Subjective:   Interval events noted.  No pain.  He said he is frustrated  because planned surgery has been rescheduled multiple times.  Objective:    Vitals:   05/24/23 0804 05/24/23 1456 05/24/23 2329 05/25/23 0811  BP: 110/70 (!) 111/56 117/61 116/78  Pulse: 76 77 79 86  Resp: 16 17 18 16   Temp:  97.9 F (36.6 C) 99.5 F (37.5 C) 99.9 F (37.7 C)  TempSrc:      SpO2: 96% 96% 95% 96%  Weight:      Height:       No data found.   Intake/Output Summary (Last 24 hours) at 05/25/2023 1107 Last data  filed at 05/25/2023 0600 Gross per 24 hour  Intake --  Output 1550 ml  Net -1550 ml   Filed Weights   05/18/23 1138 05/19/23 2354 05/20/23 1444  Weight: 64.4 kg 64.4 kg 64.4 kg    Exam:  GEN: NAD SKIN: No rash EYES: EOMI ENT: MMM CV: RRR PULM: CTA B ABD: soft, ND, NT, +BS CNS: AAO x 3, non focal EXT: Right BKA, dressing on left foot surgical wound is clean, dry and intact        Data Reviewed:   I have personally reviewed following labs and imaging studies:  Labs: Labs show the following:   Basic Metabolic Panel: Recent Labs  Lab 05/20/23 0710 05/21/23 0524 05/22/23 0420 05/23/23 0529 05/24/23 0437 05/25/23 0433  NA 140 138 135 136 133* 136  K 4.3 3.5 3.3* 3.9 3.4* 3.8  CL 111 111 108 109 105 109  CO2 20* 17* 20* 20* 20* 20*  GLUCOSE 118* 125* 141* 120* 180* 126*  BUN 25* 27* 28* 20 19 17   CREATININE 1.12 1.53* 1.39*  1.41* 1.29* 1.34* 1.24  CALCIUM 8.9 8.7* 8.5* 8.5* 8.2* 8.0*  MG 2.0 1.3*  --  1.6* 2.1 1.8   GFR Estimated Creatinine Clearance: 50.5 mL/min (by C-G formula based on SCr of 1.24 mg/dL). Liver Function Tests: Recent Labs  Lab 05/18/23 1148  AST 19  ALT 11  ALKPHOS 68  BILITOT 0.1*  PROT 7.6  ALBUMIN 3.8   No results for input(s): "LIPASE", "AMYLASE" in the last 168 hours. No results for input(s): "AMMONIA" in the last 168 hours. Coagulation profile Recent Labs  Lab 05/18/23 1521  INR 1.1    CBC: Recent Labs  Lab 05/18/23 1148 05/19/23 0606 05/20/23 2258 05/21/23 0524 05/22/23 0420  05/23/23 0529 05/24/23 0437 05/25/23 0433  WBC 11.4*   < > 8.9 15.5* 14.7* 10.2 8.2 9.9  NEUTROABS 7.4  --  8.4*  --   --   --   --   --   HGB 10.6*   < > 10.1* 8.8* 8.8* 7.9* 7.9* 7.3*  HCT 33.5*   < > 31.7* 27.0* 27.1* 24.4* 23.7* 22.3*  MCV 93.8   < > 93.8 93.1 92.2 91.0 90.8 91.4  PLT 274   < > 218 208 190 179 169 161   < > = values in this interval not displayed.   Cardiac Enzymes: No results for input(s): "CKTOTAL", "CKMB", "CKMBINDEX", "TROPONINI" in the last 168 hours. BNP (last 3 results) No results for input(s): "PROBNP" in the last 8760 hours. CBG: Recent Labs  Lab 05/24/23 0802 05/24/23 1203 05/24/23 1632 05/24/23 2200 05/25/23 0807  GLUCAP 155* 145* 139* 138* 119*   D-Dimer: No results for input(s): "DDIMER" in the last 72 hours. Hgb A1c: No results for input(s): "HGBA1C" in the last 72 hours. Lipid Profile: No results for input(s): "CHOL", "HDL", "LDLCALC", "TRIG", "CHOLHDL", "LDLDIRECT" in the last 72 hours. Thyroid function studies: No results for input(s): "TSH", "T4TOTAL", "T3FREE", "THYROIDAB" in the last 72 hours.  Invalid input(s): "FREET3" Anemia work up: No results for input(s): "VITAMINB12", "FOLATE", "FERRITIN", "TIBC", "IRON", "RETICCTPCT" in the last 72 hours. Sepsis Labs: Recent Labs  Lab 05/20/23 2152 05/20/23 2258 05/20/23 2353 05/21/23 0524 05/22/23 0420 05/23/23 0529 05/24/23 0437 05/25/23 0433  WBC  --    < >  --    < > 14.7* 10.2 8.2 9.9  LATICACIDVEN 2.2*  --  1.6  --   --   --   --   --    < > =  values in this interval not displayed.    Microbiology Recent Results (from the past 240 hour(s))  Culture, blood (Routine X 2) w Reflex to ID Panel     Status: None (Preliminary result)   Collection Time: 05/20/23 10:58 PM   Specimen: BLOOD  Result Value Ref Range Status   Specimen Description BLOOD BLOOD RIGHT ARM  Final   Special Requests   Final    BOTTLES DRAWN AEROBIC AND ANAEROBIC Blood Culture adequate volume   Culture    Final    NO GROWTH 4 DAYS Performed at [facility], [address], [place], [place] [address]    Report Status PENDING  Incomplete  Culture, blood (Routine X 2) w Reflex to ID Panel     Status: None (Preliminary result)   Collection Time: 05/20/23 11:53 PM   Specimen: BLOOD  Result Value Ref Range Status   Specimen Description BLOOD BLOOD LEFT ARM  Final   Special Requests   Final    BOTTLES DRAWN AEROBIC AND ANAEROBIC Blood Culture adequate volume   Culture   Final    NO GROWTH 3 DAYS Performed at [facility], [address] [place], [place] [address]    Report Status PENDING  Incomplete  Aerobic/Anaerobic Culture w Gram Stain (surgical/deep wound)     Status: None (Preliminary result)   Collection Time: 05/21/23  9:13 AM   Specimen: Bone; Tissue  Result Value Ref Range Status   Specimen Description   Final    BONE Performed at [facility] [address] [place], [place] [address]    Special Requests   Final    NONE Performed at [facility], [address] [place], [place] [address]    Gram Stain   Final    MODERATE WBC PRESENT, PREDOMINANTLY PMN RARE GRAM POSITIVE COCCI RARE GRAM POSITIVE RODS CRITICAL RESULT CALLED TO, READ BACK BY AND VERIFIED WITH: RN Georgian Co [id number] AT 1457 BY EC Performed at [facility], [address] [place], [place] [address]    Culture   Final    FEW STAPHYLOCOCCUS LUGDUNENSIS RARE DIPHTHEROIDS(CORYNEBACTERIUM SPECIES) Standardized susceptibility testing for this organism is not available. NO ANAEROBES ISOLATED; CULTURE IN PROGRESS FOR 5 DAYS    Report Status PENDING  Incomplete   Organism ID, Bacteria STAPHYLOCOCCUS LUGDUNENSIS  Final      Susceptibility   Staphylococcus lugdunensis - MIC*    CIPROFLOXACIN <=0.5 SENSITIVE Sensitive     ERYTHROMYCIN <=0.25 SENSITIVE Sensitive     GENTAMICIN <=0.5 SENSITIVE Sensitive     OXACILLIN >=4 RESISTANT Resistant     TETRACYCLINE <=1  SENSITIVE Sensitive     VANCOMYCIN <=0.5 SENSITIVE Sensitive     TRIMETH/SULFA <=10 SENSITIVE Sensitive     CLINDAMYCIN <=0.25 SENSITIVE Sensitive     RIFAMPIN <=0.5 SENSITIVE Sensitive     Inducible Clindamycin NEGATIVE Sensitive     * FEW STAPHYLOCOCCUS LUGDUNENSIS    Procedures and diagnostic studies:  No results found.             LOS: 7 days   Jordan Arellano  [facility]   Pager on [email or web address]. If 7PM-7AM, please contact night-coverage at [email or web address]     05/25/2023, 11:07 AM

## 2023-05-25 NOTE — Consult Note (Signed)
PHARMACY CONSULT NOTE  Pharmacy Consult for Electrolyte Monitoring and Replacement   Recent Labs: Potassium (mmol/L)  Date Value  05/25/2023 3.8   Magnesium (mg/dL)  Date Value  40/98/1191 1.8   Calcium (mg/dL)  Date Value  47/82/9562 8.0 (L)   Albumin (g/dL)  Date Value  13/07/6577 3.8   Phosphorus (mg/dL)  Date Value  46/96/2952 3.6   Sodium (mmol/L)  Date Value  05/25/2023 136  02/03/2016 139   Assessment: 72 y/o M with medical history including basal cell carcinoma, anxiety / depression, PAD s/p right BKA, CVA, HTN, HLD admitted with OM left first and third toes s/p amputation and bilateral common iliac artery stenting for left lower extremity limb threatening ischemia. Pharmacy consulted to assist with electrolyte monitoring and replacement as indicated.  Goal of Therapy:  Electrolytes within normal limits  Plan:  --Mag 1.8  will order Magnesium sulfate 2 gm IV x1 --Follow-up electrolytes with AM labs tomorrow  Angelique Blonder, PharmD 05/25/2023 7:33 AM

## 2023-05-25 NOTE — Consult Note (Signed)
ANTICOAGULATION CONSULT NOTE   Pharmacy Consult for Heparin Infusion Indication:  Lower extremity ischemia  Patient Measurements: Height: 6\' 1"  (185.4 cm) Weight: 64.4 kg (141 lb 15.6 oz) IBW/kg (Calculated) : 79.9 Heparin Dosing Weight: 64.4 kg  Labs: Recent Labs    05/23/23 0529 05/23/23 0755 05/24/23 0437 05/24/23 1100 05/25/23 0433 05/25/23 1233  HGB 7.9*  --  7.9*  --  7.3*  --   HCT 24.4*  --  23.7*  --  22.3*  --   PLT 179  --  169  --  161  --   HEPARINUNFRC  --    < > 0.32 0.38 0.26* 0.40  CREATININE 1.29*  --  1.34*  --  1.24  --    < > = values in this interval not displayed.    Estimated Creatinine Clearance: 50.5 mL/min (by C-G formula based on SCr of 1.24 mg/dL).  Medical History: Past Medical History:  Diagnosis Date   Basal cell carcinoma 08/11/2022   R forearm - ED&C   Depression    Foot ulcer (HCC) 12/12/2016   Hyperlipidemia    Hypertension    PAD (peripheral artery disease) (HCC) ~2007   s/p R BKA for non-healing wound   Stroke (HCC) 03/2014   MRI: Acute nonhemorrhagic left paracentral pontine infarct. 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   TIA (transient ischemic attack) 01/2014    Medications:  No prior anticoagulation noted   Assessment: 72 year old male with history of hypertension, hyperlipidemia, depression, TIA, R BKA who presents to the emergency department for chief concerns of left foot pain and redness with swelling. Pt with history of prior lower extremity stents placed 02/2023. Pharmacy has been consulted to initiate and manage IV heparin therapy for lower extremity aterial occlusion   Goal of Therapy:  Heparin level 0.3-0.7 units/ml Monitor platelets by anticoagulation protocol: Yes   0615 0657 HL 0.32, therapeutic x 1 0616 0420 HL 0.15, subtherapeutic x 1 0616 1408 HL 0.19,  subtherapeutic 0616 2313 HL 0.29, subtherapeutic  0617 0755 HL 0.38,  therapeutic x 1 0617 1341 HL 0.12, subtherapeutic 0617 1928 HL 0.25, subtherapeutic 0618 0437 HL 0.32, therapeutic X 1  0618 1100 HL 0.38, therapeutic x 2 0619 0433 HL 0.26, SUBtherapeutic  0619 1233 HL 0.40, Therapeutic x 1  Plan: 0619 1233 HL 0.40, Therapeutic x 1 - Will continue drip rate at 1900 units/hr.  - Will check confirmatory HL 6 hrs   --CBC daily   Laconya Clere A Clinical Pharmacist 05/25/2023

## 2023-05-25 NOTE — Progress Notes (Signed)
4 Days Post-Op   Subjective/Chief Complaint: Patient seen.  Actual date of service was yesterday, June 18.  No specific complaints.   Objective: Vital signs in last 24 hours: Temp:  [97.9 F (36.6 C)-99.5 F (37.5 C)] 99.5 F (37.5 C) (06/18 2329) Pulse Rate:  [76-79] 79 (06/18 2329) Resp:  [16-18] 18 (06/18 2329) BP: (110-117)/(56-70) 117/61 (06/18 2329) SpO2:  [95 %-96 %] 95 % (06/18 2329) Last BM Date : 05/22/23  Intake/Output from previous day: 06/18 0701 - 06/19 0700 In: -  Out: 1700 [Urine:1700] Intake/Output this shift: No intake/output data recorded.  Bandage on the left foot is dry and intact.  Upon removal no significant bleeding at the surgical sites.  Incisions are well coapted and wound edges are still appearing viable.  No sign of infection.  Lab Results:  Recent Labs    05/24/23 0437 05/25/23 0433  WBC 8.2 9.9  HGB 7.9* 7.3*  HCT 23.7* 22.3*  PLT 169 161   BMET Recent Labs    05/24/23 0437 05/25/23 0433  NA 133* 136  K 3.4* 3.8  CL 105 109  CO2 20* 20*  GLUCOSE 180* 126*  BUN 19 17  CREATININE 1.34* 1.24  CALCIUM 8.2* 8.0*   PT/INR No results for input(s): "LABPROT", "INR" in the last 72 hours. ABG No results for input(s): "PHART", "HCO3" in the last 72 hours.  Invalid input(s): "PCO2", "PO2"  Studies/Results: No results found.  Anti-infectives: Anti-infectives (From admission, onward)    Start     Dose/Rate Route Frequency Ordered Stop   05/22/23 0830  vancomycin (VANCOCIN) IVPB 1000 mg/200 mL premix        1,000 mg 200 mL/hr over 60 Minutes Intravenous Every 24 hours 05/22/23 0734     05/21/23 1032  vancomycin variable dose per unstable renal function (pharmacist dosing)  Status:  Discontinued         Does not apply See admin instructions 05/21/23 1032 05/22/23 0734   05/20/23 1400  ceFAZolin (ANCEF) IVPB 2g/100 mL premix        2 g 200 mL/hr over 30 Minutes Intravenous 30 min pre-op 05/20/23 1400 05/20/23 1552   05/19/23 2000   vancomycin (VANCOCIN) IVPB 1000 mg/200 mL premix  Status:  Discontinued        1,000 mg 200 mL/hr over 60 Minutes Intravenous Every 24 hours 05/18/23 1827 05/19/23 1034   05/19/23 2000  vancomycin (VANCOREADY) IVPB 1250 mg/250 mL  Status:  Discontinued        1,250 mg 166.7 mL/hr over 90 Minutes Intravenous Every 24 hours 05/19/23 1034 05/21/23 1032   05/18/23 1900  vancomycin (VANCOREADY) IVPB 1500 mg/300 mL        1,500 mg 150 mL/hr over 120 Minutes Intravenous  Once 05/18/23 1827 05/18/23 2120   05/18/23 1830  ceFEPIme (MAXIPIME) 2 g in sodium chloride 0.9 % 100 mL IVPB  Status:  Discontinued        2 g 200 mL/hr over 30 Minutes Intravenous Every 12 hours 05/18/23 1827 05/24/23 1122   05/18/23 1400  cefTRIAXone (ROCEPHIN) 2 g in sodium chloride 0.9 % 100 mL IVPB        2 g 200 mL/hr over 30 Minutes Intravenous  Once 05/18/23 1347 05/18/23 1438       Assessment/Plan: s/p Procedure(s): AMPUTATION LEFT 1st & 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  Plan: Betadine gauze and sterile dressings applied to the right foot.  Wrapped very loosely  with Kerlix and Ace.  Patient instructed to keep this clean dry and in place without removing until seen.  I will see him back in the office next week.  Due to his previous amputation on the right leg believe the patient may be a good candidate for skilled nursing for a period of time.  Patient is stable for discharge or transfer and podiatry will sign off for now.  LOS: 7 days    Ricci Barker 05/25/2023

## 2023-05-25 NOTE — Progress Notes (Signed)
Progress Note    05/25/2023 11:05 PM * No surgery date entered *  Subjective:  Kenten Freire is a 72 y.o. 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  He does not yet have inline flow to heal his left lower extremity wounds.   On exam today the patient was resting comfortably in bed. He has no complaints. We discussed in detail the plan to go back to the vascular lab tomorrow to access his left lower extremity from below his groin now that he has stents placed in both his iliac arteries to  attempt to open up blood flow to his foot. Patients vitals all remain stable. Patient verbalizes his understanding.    Vitals:   05/25/23 1730 05/25/23 2134  BP: 117/66 (!) 120/58  Pulse: 73 75  Resp: 16 20  Temp: 98.2 F (36.8 C) 98 F (36.7 C)  SpO2: 97% 94%   Physical Exam: Cardiac:  RRR normal S1, S2. No rubs, clicks or gallup's.  Lungs:  Clear on auscultation throughout, normal respiratory effort.  Incisions:  Right groin with dressing clean dry and intact. No hematoma, seroma or infection to note.  Extremities:  Right prior BKA, left lower extremity unable to palpate pulses but remains warm to touch. Abdomen:  Positive bowel sounds throughout, soft, flat, non tender and non distended.  Neurologic: AAOX3 and follows all commands. Answers all questions appropriately.   CBC    Component Value Date/Time   WBC 9.9 05/25/2023 0433   RBC 2.44 (L) 05/25/2023 0433   HGB 7.3 (L) 05/25/2023 0433   HCT 22.3 (L) 05/25/2023 0433   PLT 161 05/25/2023 0433   MCV 91.4 05/25/2023 0433   MCH 29.9 05/25/2023 0433   MCHC 32.7 05/25/2023 0433   RDW 17.4 (H) 05/25/2023 0433   LYMPHSABS 0.3 (L) 05/20/2023 2258   MONOABS 0.1 05/20/2023 2258   EOSABS 0.1 05/20/2023 2258   BASOSABS 0.0 05/20/2023 2258    BMET    Component Value Date/Time   NA 136 05/25/2023 0433   NA 139 02/03/2016 0000   K 3.8  05/25/2023 0433   CL 109 05/25/2023 0433   CO2 20 (L) 05/25/2023 0433   GLUCOSE 126 (H) 05/25/2023 0433   BUN 17 05/25/2023 0433   BUN 28 (A) 02/03/2016 0000   CREATININE 1.24 05/25/2023 0433   CALCIUM 8.0 (L) 05/25/2023 0433   GFRNONAA >60 05/25/2023 0433   GFRAA >60 10/02/2017 0943    INR    Component Value Date/Time   INR 1.1 05/18/2023 1521     Intake/Output Summary (Last 24 hours) at 05/25/2023 2305 Last data filed at 05/25/2023 1700 Gross per 24 hour  Intake --  Output 2150 ml  Net -2150 ml     Assessment/Plan:  72 y.o. male is s/p Aortogram with selective left lower extremity angiogram with bilateral iliac stent placement.  * No surgery date entered *   PLAN: Vascular surgery plans on taking the patient back to the vascular lab tomorrow 05/26/2023 for left lower extremity selective angiogram from a posterior approach. I discussed in detail with the patient the procedure, benefits, risks and complications. He verbalized his understanding. I answered all the patients questions today. Patient will be made NPO after midnight for procedure tomorrow 05/26/2023.   DVT prophylaxis:  Heparin Infusion  I discussed in detail the plan with Dr [name] MD and he agrees with the plan.    Huel Cote  [facility] 05/25/2023 11:05 PM

## 2023-05-25 NOTE — Consult Note (Signed)
[facility] Face-to-Face Psychiatry Consult   Reason for Consult:  Family request consult to evaluate for comptency Referring Physician:  Dr. 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) Active Problems:   History of CVA (cerebrovascular accident) without residual deficits   Essential hypertension   Tobacco abuse   PAD s/p right BKA, history revascularization left leg   Hypertriglyceridemia   Status post femoral-popliteal bypass surgery   Left renal artery stenosis (HCC)   Recurrent major depressive disorder, in full remission (HCC)   Dyslipidemia   Depression   Heart failure with preserved ejection fraction (HCC)   Encounter for tobacco use cessation counseling   At risk for constipation   Acute hematogenous osteomyelitis of left foot (HCC)   Protein-calorie malnutrition, severe   Total Time spent with patient: 45 minutes  Subjective:   Pt is a 72 y/o male w/ history of hypertension, hyperlipidemia, depression, TIA, stroke, PAD admitted to hospital due to osteomyelitis of the left first and third toes. Appears he is declining going to SNF upon discharge. This was a consult for decision making capacity regarding SNF.   HPI:    In an evaluation of capacity, each of the following criteria must be met based on medical necessity in order for a patient to have capacity to make the decision in question. Of note, the capacity evaluation assesses only for the specified decision documented above and is not a determination of the patient's overall competency, which can only be adjudicated.  Criterion 1: The patient demonstrates a clear and consistent voluntary choice with regard to treatment options. YES.   Criterion 2: The patient adequately understands the disease  they have, the treatment proposed, the risks of treatment, and the risks of other treatment (including no treatment). YES  Criterion 3: The patient acknowledges that the details of Criterion 2 apply to them specifically and the likely consequences of treatment options proposed. YES  Criterion 4: The patient demonstrates adequate reasoning/rationality within the context of their decision and can provide justification for their choice. YES  In this case, the patient DOES have does have decision making capacity regarding SNF. See patient interview below for details.   Patient seen at bedside. He is alert and oriented to self (name - "Jordan Arellano", age - "72", date of birth "Dec 05, 1951"), month ("June"), year ("2024"), location ("hospital"), city [place]").   Discussed with patient purpose of today's evaluation as capacity evaluation for SNF. Patient verbalized understanding. He indicates a clear preference to go home after discharge instead of going to SNF.   Patient verbalizes understanding of reason for hospital admission. He states he was admitted to the hospital "because I got an infection in my left big toe and 3rd toe". He states that these toes needed to be amputated during his admission and points to his left foot. He states his doctors have talked to him about going to SNF following discharge. He states his sister, in particular, has been urging him to go. He states he has been to SNF before, after breaking his hip. He did not find the experience to be too helpful. He verbalizes understanding of risks of not going to a SNF. He states if he does not go to a SNF there is greater potential for getting worse as there is more care  involved in a SNF. He understands possibility of infection or poor wound healing, possibility leading to death. When asked whether he understands that this means he could get worse, get infected, have poor wound healing, die, he states he could, but that he feels  he feels he can manage with support at home. He states "I use a walker, and a cane, a prosthetic. I do my own stuff. I can wash and dry my clothes. I vacuum." He states he has nursing come to his home 3 times a week, 1 hour/occasion. He states he has support in his sister and his son. He speaks with his sister "nearly every day" and he speaks with his son "about once a week". His sister also helps with things he needs done at home. He states he is willing to follow up with his doctors outpatient.   Patient states he does not want to go from one facility to another. He states "I like being at home. I'm a homebody". "I've been there [SNF] before." He states he misses his dog. Dog is currently with his sister. He states if he feels he cannot manage, he would talk to his sister or son, and would consider SNF at that time. When asked what not managing would look like, he states "spots on my legs like my toes, feeling sick, fall, unable to do things".   Pt gave verbal consent to speak with his sister, Glendon Axe [phone]. Discussed w/ Luanne, the purpose of today's capacity assessment as specific to decision making regarding SNF. She states she was hoping for a competence assessment. Discussed with Luanne that competence would have to be adjudicated. She states she and her husband have gotten a Clinical research associate. They are trying to get pt neurological testing. She states pt has nursing at home in place that will be with him for the next 6 weeks. She states she assists pt in getting his groceries, filling his fridge, cleaning, getting to medical appointments. She would like for pt to go to SNF. Did inform Luanne that pt does have decision making capacity for going to SNF. She verbalized understanding.   Past Medical History:  Past Medical History:  Diagnosis Date   Basal cell carcinoma 08/11/2022   R forearm - ED&C   Depression    Foot ulcer (HCC) 12/12/2016   Hyperlipidemia    Hypertension    PAD (peripheral artery  disease) (HCC) ~2007   s/p R BKA for non-healing wound   Stroke (HCC) 03/2014   MRI: Acute nonhemorrhagic left paracentral pontine infarct. 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   TIA (transient ischemic attack) 01/2014    Past Surgical History:  Procedure Laterality Date   AMPUTATION TOE Left 05/21/2023   Procedure: AMPUTATION LEFT 1st & 3rd TOES;  Surgeon: Linus Galas, DPM;  Location: [facility] ORS;  Service: Podiatry;  Laterality: Left;   BELOW KNEE LEG AMPUTATION Right    FEMORAL-POPLITEAL BYPASS GRAFT Left 12/17/2016   Procedure: BYPASS LEFT FEMORAL TO BELOW POPLITEAL ARTERY USING PROPATEN GORE GRAFT;  Surgeon: Larina Earthly, MD;  Location: [facility] OR;  Service: Vascular;  Laterality: Left;   FEMORAL-POPLITEAL BYPASS GRAFT Left 09/30/2017   Procedure: LEFT LEG ANGIOGRAM,  THROMBECTOMY, FEM-POPLITEAL BYPASS GRAFT, tHROMBECTOMY PERONEAL ARTERY AND POSTERIOR TIBIAL , ENDARTERECTOMY TIBIAL/PERONEAL TRUNK WITH BOVINE PATCH ANGIOPLASTY.;  Surgeon: Fransisco Hertz, MD;  Location: [facility] OR;  Service: Vascular;  Laterality: Left;   INTRAMEDULLARY (IM) NAIL INTERTROCHANTERIC Right  03/21/2023   Procedure: INTRAMEDULLARY (IM) NAIL INTERTROCHANTERIC;  Surgeon: Deeann Saint, MD;  Location: [facility] ORS;  Service: Orthopedics;  Laterality: Right;   LOWER EXTREMITY ANGIOGRAPHY Left 12/14/2022   Procedure: Lower Extremity Angiography;  Surgeon: Renford Dills, MD;  Location: [facility] INVASIVE CV LAB;  Service: Cardiovascular;  Laterality: Left;   LOWER EXTREMITY ANGIOGRAPHY Left 05/20/2023   Procedure: Lower Extremity Angiography;  Surgeon: Annice Needy, MD;  Location: [facility] INVASIVE CV LAB;  Service: Cardiovascular;  Laterality: Left;   LOWER EXTREMITY INTERVENTION  05/20/2023   Procedure: LOWER EXTREMITY INTERVENTION;  Surgeon: Annice Needy, MD;  Location: [facility] INVASIVE CV LAB;  Service: Cardiovascular;;   PERIPHERAL VASCULAR CATHETERIZATION Left  12/14/2016   Procedure: Abdominal Aortogram w/Lower Extremity;  Surgeon: Chuck Hint, MD;  Location: [facility] INVASIVE CV LAB;  Service: Cardiovascular;  Laterality: Left;   right cataract extraction     TRANSTHORACIC ECHOCARDIOGRAM  01/2014   To evaluate possible CVA: EF 55-60%. GR 1 DD. No significant valvular lesions   Family History:  Family History  Problem Relation Age of Onset   Hypertension Mother        Does not know history   Heart disease Mother    Stroke Mother    Diabetes Mother    Hypertension Father    Heart disease Father    Stroke Father    Diabetes Sister    Hypertension Sister    Heart disease Brother    Hypertension Brother     Social History:  Social History   Substance and Sexual Activity  Alcohol Use No     Social History   Substance and Sexual Activity  Drug Use No    Social History   Socioeconomic History   Marital status: Widowed    Spouse name: Not on file   Number of children: 2   Years of education: Not on file   Highest education level: Not on file  Occupational History    Comment: Disabled  Tobacco Use   Smoking status: Some Days    Packs/day: 15.00    Years: 60.00    Additional pack years: 0.00    Total pack years: 900.00    Types: Cigarettes   Smokeless tobacco: Never  Vaping Use   Vaping Use: Never used  Substance and Sexual Activity   Alcohol use: No   Drug use: No   Sexual activity: Not Currently    Birth control/protection: Abstinence  Other Topics Concern   Not on file  Social History Narrative   One boy youngest, killed in MVA    Social Determinants of Health   Financial Resource Strain: Low Risk  (10/08/2021)   Overall Financial Resource Strain (CARDIA)    Difficulty of Paying Living Expenses: Not hard at all  Food Insecurity: No Food Insecurity (05/19/2023)   Hunger Vital Sign    Worried About Running Out of Food in the Last Year: Never true    Ran Out of Food in the Last Year: Never true   Transportation Needs: No Transportation Needs (05/19/2023)   PRAPARE - Administrator, Civil Service (Medical): No    Lack of Transportation (Non-Medical): No  Physical Activity: Insufficiently Active (04/07/2022)   Exercise Vital Sign    Days of Exercise per Week: 2 days    Minutes of Exercise per Session: 30 min  Stress: No Stress Concern Present (04/07/2022)   [employer] of Occupational Health - Occupational Stress Questionnaire    Feeling of Stress :  Not at all  Social Connections: Socially Isolated (04/07/2022)   Social Connection and Isolation Panel [NHANES]    Frequency of Communication with Friends and Family: Three times a week    Frequency of Social Gatherings with Friends and Family: Three times a week    Attends Religious Services: Never    Active Member of Clubs or Organizations: No    Attends Banker Meetings: Never    Marital Status: Widowed   Additional Social History:    Allergies:  No Known Allergies  Labs:  Results for orders placed or performed during the hospital encounter of 05/18/23 (from the past 48 hour(s))  Heparin level (unfractionated)     Status: Abnormal   Collection Time: 05/23/23  1:41 PM  Result Value Ref Range   Heparin Unfractionated 0.12 (L) 0.30 - 0.70 IU/mL    Comment: (NOTE) The clinical reportable range upper limit is being lowered to >1.10 to align with the FDA approved guidance for the current laboratory assay.  If heparin results are below expected values, and patient dosage has  been confirmed, suggest follow up testing of antithrombin III levels. Performed at [facility], [address] [place], [place] [address]   Glucose, capillary     Status: Abnormal   Collection Time: 05/23/23  5:01 PM  Result Value Ref Range   Glucose-Capillary 125 (H) 70 - 99 mg/dL    Comment: Glucose reference range applies only to samples taken after fasting for at least 8 hours.  Glucose, capillary     Status:  Abnormal   Collection Time: 05/23/23  5:28 PM  Result Value Ref Range   Glucose-Capillary 116 (H) 70 - 99 mg/dL    Comment: Glucose reference range applies only to samples taken after fasting for at least 8 hours.  Heparin level (unfractionated)     Status: Abnormal   Collection Time: 05/23/23  7:28 PM  Result Value Ref Range   Heparin Unfractionated 0.25 (L) 0.30 - 0.70 IU/mL    Comment: (NOTE) The clinical reportable range upper limit is being lowered to >1.10 to align with the FDA approved guidance for the current laboratory assay.  If heparin results are below expected values, and patient dosage has  been confirmed, suggest follow up testing of antithrombin III levels. Performed at [facility], [address] [place], [place] [address]   Glucose, capillary     Status: Abnormal   Collection Time: 05/23/23 10:12 PM  Result Value Ref Range   Glucose-Capillary 149 (H) 70 - 99 mg/dL    Comment: Glucose reference range applies only to samples taken after fasting for at least 8 hours.  Basic metabolic panel     Status: Abnormal   Collection Time: 05/24/23  4:37 AM  Result Value Ref Range   Sodium 133 (L) 135 - 145 mmol/L   Potassium 3.4 (L) 3.5 - 5.1 mmol/L   Chloride 105 98 - 111 mmol/L   CO2 20 (L) 22 - 32 mmol/L   Glucose, Bld 180 (H) 70 - 99 mg/dL    Comment: Glucose reference range applies only to samples taken after fasting for at least 8 hours.   BUN 19 8 - 23 mg/dL   Creatinine, Ser 0.98 (H) 0.61 - 1.24 mg/dL   Calcium 8.2 (L) 8.9 - 10.3 mg/dL   GFR, Estimated 57 (L) >60 mL/min    Comment: (NOTE) Calculated using the CKD-EPI Creatinine Equation (2021)    Anion gap 8 5 - 15  Comment: Performed at [facility], [address] [place], [place] [address]  Magnesium     Status: None   Collection Time: 05/24/23  4:37 AM  Result Value Ref Range   Magnesium 2.1 1.7 - 2.4 mg/dL    Comment: Performed at [facility], [address]  [place], [place] [address]  CBC     Status: Abnormal   Collection Time: 05/24/23  4:37 AM  Result Value Ref Range   WBC 8.2 4.0 - 10.5 K/uL   RBC 2.61 (L) 4.22 - 5.81 MIL/uL   Hemoglobin 7.9 (L) 13.0 - 17.0 g/dL   HCT 09.8 (L) 11.9 - 14.7 %   MCV 90.8 80.0 - 100.0 fL   MCH 30.3 26.0 - 34.0 pg   MCHC 33.3 30.0 - 36.0 g/dL   RDW 82.9 (H) 56.2 - 13.0 %   Platelets 169 150 - 400 K/uL   nRBC 0.0 0.0 - 0.2 %    Comment: Performed at [facility], [address] [place], [place] [address]  Heparin level (unfractionated)     Status: None   Collection Time: 05/24/23  4:37 AM  Result Value Ref Range   Heparin Unfractionated 0.32 0.30 - 0.70 IU/mL    Comment: (NOTE) The clinical reportable range upper limit is being lowered to >1.10 to align with the FDA approved guidance for the current laboratory assay.  If heparin results are below expected values, and patient dosage has  been confirmed, suggest follow up testing of antithrombin III levels. Performed at [facility], [address] [place], [place] [address]   Glucose, capillary     Status: Abnormal   Collection Time: 05/24/23  8:02 AM  Result Value Ref Range   Glucose-Capillary 155 (H) 70 - 99 mg/dL    Comment: Glucose reference range applies only to samples taken after fasting for at least 8 hours.  Heparin level (unfractionated)     Status: None   Collection Time: 05/24/23 11:00 AM  Result Value Ref Range   Heparin Unfractionated 0.38 0.30 - 0.70 IU/mL    Comment: (NOTE) The clinical reportable range upper limit is being lowered to >1.10 to align with the FDA approved guidance for the current laboratory assay.  If heparin results are below expected values, and patient dosage has  been confirmed, suggest follow up testing of antithrombin III levels. Performed at [facility], [address] [place], [place] [address]   Glucose, capillary     Status: Abnormal   Collection Time: 05/24/23 12:03 PM   Result Value Ref Range   Glucose-Capillary 145 (H) 70 - 99 mg/dL    Comment: Glucose reference range applies only to samples taken after fasting for at least 8 hours.  Glucose, capillary     Status: Abnormal   Collection Time: 05/24/23  4:32 PM  Result Value Ref Range   Glucose-Capillary 139 (H) 70 - 99 mg/dL    Comment: Glucose reference range applies only to samples taken after fasting for at least 8 hours.  Glucose, capillary     Status: Abnormal   Collection Time: 05/24/23 10:00 PM  Result Value Ref Range   Glucose-Capillary 138 (H) 70 - 99 mg/dL    Comment: Glucose reference range applies only to samples taken after fasting for at least 8 hours.  Basic metabolic panel     Status: Abnormal   Collection Time: 05/25/23  4:33 AM  Result Value Ref Range   Sodium 136 135 - 145  mmol/L   Potassium 3.8 3.5 - 5.1 mmol/L   Chloride 109 98 - 111 mmol/L   CO2 20 (L) 22 - 32 mmol/L   Glucose, Bld 126 (H) 70 - 99 mg/dL    Comment: Glucose reference range applies only to samples taken after fasting for at least 8 hours.   BUN 17 8 - 23 mg/dL   Creatinine, Ser 1.61 0.61 - 1.24 mg/dL   Calcium 8.0 (L) 8.9 - 10.3 mg/dL   GFR, Estimated >09 >60 mL/min    Comment: (NOTE) Calculated using the CKD-EPI Creatinine Equation (2021)    Anion gap 7 5 - 15    Comment: Performed at [facility], [address] [place], [place] [address]  Magnesium     Status: None   Collection Time: 05/25/23  4:33 AM  Result Value Ref Range   Magnesium 1.8 1.7 - 2.4 mg/dL    Comment: Performed at [facility], [address] [place], [place] [address]  CBC     Status: Abnormal   Collection Time: 05/25/23  4:33 AM  Result Value Ref Range   WBC 9.9 4.0 - 10.5 K/uL   RBC 2.44 (L) 4.22 - 5.81 MIL/uL   Hemoglobin 7.3 (L) 13.0 - 17.0 g/dL   HCT 47.8 (L) 29.5 - 62.1 %   MCV 91.4 80.0 - 100.0 fL   MCH 29.9 26.0 - 34.0 pg   MCHC 32.7 30.0 - 36.0 g/dL   RDW 30.8 (H) 65.7 - 84.6 %   Platelets 161  150 - 400 K/uL   nRBC 0.0 0.0 - 0.2 %    Comment: Performed at [facility], [address] [place], [place] [address]  Heparin level (unfractionated)     Status: Abnormal   Collection Time: 05/25/23  4:33 AM  Result Value Ref Range   Heparin Unfractionated 0.26 (L) 0.30 - 0.70 IU/mL    Comment: (NOTE) The clinical reportable range upper limit is being lowered to >1.10 to align with the FDA approved guidance for the current laboratory assay.  If heparin results are below expected values, and patient dosage has  been confirmed, suggest follow up testing of antithrombin III levels. Performed at [facility], [address] [place], [place] [address]   Glucose, capillary     Status: Abnormal   Collection Time: 05/25/23  8:07 AM  Result Value Ref Range   Glucose-Capillary 119 (H) 70 - 99 mg/dL    Comment: Glucose reference range applies only to samples taken after fasting for at least 8 hours.  Glucose, capillary     Status: Abnormal   Collection Time: 05/25/23 11:55 AM  Result Value Ref Range   Glucose-Capillary 180 (H) 70 - 99 mg/dL    Comment: Glucose reference range applies only to samples taken after fasting for at least 8 hours.  Heparin level (unfractionated)     Status: None   Collection Time: 05/25/23 12:33 PM  Result Value Ref Range   Heparin Unfractionated 0.40 0.30 - 0.70 IU/mL    Comment: (NOTE) The clinical reportable range upper limit is being lowered to >1.10 to align with the FDA approved guidance for the current laboratory assay.  If heparin results are below expected values, and patient dosage has  been confirmed, suggest follow up testing of antithrombin III levels. Performed at [facility], [address] [place], [place] [address]     Current Facility-Administered Medications  Medication Dose Route Frequency Provider Last Rate Last Admin  0.9 %  sodium chloride infusion  250 mL Intravenous Continuous Linus Galas, DPM    Stopped at 05/21/23 0815   acetaminophen (TYLENOL) tablet 650 mg  650 mg Oral Q6H PRN Charise Killian, MD       ascorbic acid (VITAMIN C) tablet 500 mg  500 mg Oral BID Charise Killian, MD   500 mg at 05/25/23 1610   aspirin EC tablet 81 mg  81 mg Oral Daily Linus Galas, DPM   81 mg at 05/25/23 0911   bisacodyl (DULCOLAX) EC tablet 10 mg  10 mg Oral Daily PRN Linus Galas, DPM       Chlorhexidine Gluconate Cloth 2 % PADS 6 each  6 each Topical Daily Linus Galas, DPM   6 each at 05/24/23 9604   cyanocobalamin (VITAMIN B12) tablet 1,000 mcg  1,000 mcg Oral Daily Linus Galas, DPM   1,000 mcg at 05/25/23 0911   feeding supplement (ENSURE ENLIVE / ENSURE PLUS) liquid 237 mL  237 mL Oral TID BM Charise Killian, MD   237 mL at 05/25/23 1258   folic acid (FOLVITE) tablet 1 mg  1 mg Oral Daily Linus Galas, DPM   1 mg at 05/25/23 0912   heparin ADULT infusion 100 units/mL (25000 units/283mL)  1,900 Units/hr Intravenous Continuous Charise Killian, MD 19 mL/hr at 05/25/23 0607 1,900 Units/hr at 05/25/23 0607   insulin aspart (novoLOG) injection 0-15 Units  0-15 Units Subcutaneous TID WC Linus Galas, DPM   3 Units at 05/25/23 1257   insulin aspart (novoLOG) injection 0-5 Units  0-5 Units Subcutaneous QHS Linus Galas, DPM       latanoprost (XALATAN) 0.005 % ophthalmic solution 1 drop  1 drop Both Eyes QHS Barrie Folk, RPH   1 drop at 05/24/23 2233   morphine (PF) 2 MG/ML injection 2 mg  2 mg Intravenous Q4H PRN Vida Rigger, MD   2 mg at 05/24/23 0855   multivitamin with minerals tablet 1 tablet  1 tablet Oral Daily Charise Killian, MD   1 tablet at 05/25/23 0911   nicotine (NICODERM CQ - dosed in mg/24 hours) patch 14 mg  14 mg Transdermal Daily PRN Linus Galas, DPM       Oral care mouth rinse  15 mL Mouth Rinse PRN Charise Killian, MD       oxyCODONE-acetaminophen (PERCOCET/ROXICET) 5-325 MG per tablet 1-2 tablet  1-2 tablet Oral Q4H PRN Charise Killian, MD        polyethylene glycol (MIRALAX / GLYCOLAX) packet 17 g  17 g Oral Daily Linus Galas, DPM   17 g at 05/25/23 0911   rosuvastatin (CRESTOR) tablet 40 mg  40 mg Oral QHS Linus Galas, DPM   40 mg at 05/24/23 2231   senna-docusate (Senokot-S) tablet 1 tablet  1 tablet Oral QHS PRN Linus Galas, DPM       sertraline (ZOLOFT) tablet 100 mg  100 mg Oral Daily Linus Galas, DPM   100 mg at 05/25/23 0911   vancomycin (VANCOCIN) IVPB 1000 mg/200 mL premix  1,000 mg Intravenous Q24H Bari Mantis A, RPH 200 mL/hr at 05/25/23 1055 1,000 mg at 05/25/23 1055   zinc sulfate capsule 220 mg  220 mg Oral Daily Charise Killian, MD   220 mg at 05/25/23 0911   Musculoskeletal: Strength & Muscle Tone:  sitting up on bed on assessment Gait & Station:  sitting up on bed on assessment Patient leans:  sitting up on bed  on assessment  Psychiatric Specialty Exam:  Presentation  General Appearance:  Appropriate for Environment  Eye Contact: Fair  Speech: Clear and Coherent; Normal Rate  Speech Volume: Normal  Handedness: Right   Mood and Affect  Mood: Euthymic  Affect: Blunt   Thought Process  Thought Processes: Coherent; Goal Directed; Linear  Descriptions of Associations:Intact  Orientation:Full (Time, Place and Person)  Thought Content:Logical  History of Schizophrenia/Schizoaffective disorder:No data recorded Duration of Psychotic Symptoms:No data recorded Hallucinations:Hallucinations: None  Ideas of Reference:None  Suicidal Thoughts:Suicidal Thoughts: No  Homicidal Thoughts:Homicidal Thoughts: No   Sensorium  Memory: Immediate Fair  Judgment: Intact  Insight: Present   Executive Functions  Concentration: Fair  Attention Span: Fair  Recall: [employer] of Knowledge: Fair  Language: Fair   Psychomotor Activity  Psychomotor Activity: Psychomotor Activity: Normal   Assets  Assets: Communication Skills; Desire for Improvement; Financial  Resources/Insurance; Housing; Resilience; Social Support   Sleep  Sleep: Sleep: Fair   Physical Exam: Physical Exam Constitutional:      General: He is not in acute distress.    Appearance: He is not ill-appearing, toxic-appearing or diaphoretic.  Eyes:     General: No scleral icterus. Cardiovascular:     Rate and Rhythm: Normal rate.  Pulmonary:     Effort: Pulmonary effort is normal. No respiratory distress.  Neurological:     Mental Status: He is alert and oriented to person, place, and time.  Psychiatric:        Attention and Perception: Attention and perception normal.        Mood and Affect: Mood normal. Affect is blunt.        Speech: Speech normal.        Behavior: Behavior normal. Behavior is cooperative.        Thought Content: Thought content normal.        Cognition and Memory: Cognition and memory normal.        Judgment: Judgment normal.    Review of Systems  Constitutional:  Negative for chills and fever.  Respiratory:  Negative for shortness of breath.   Cardiovascular:  Negative for chest pain and palpitations.  Gastrointestinal:  Negative for abdominal pain.  Neurological:  Negative for headaches.   Blood pressure 116/78, pulse 86, temperature 99.9 F (37.7 C), resp. rate 16, height 6\' 1"  (1.854 m), weight 64.4 kg, SpO2 96 %. Body mass index is 18.73 kg/m.  Treatment Plan Summary: Pt is a 72 y/o male w/ history of hypertension, hyperlipidemia, depression, TIA, stroke, PAD admitted to hospital due to osteomyelitis of the left first and third toes. Appears he is declining to go to SNF upon discharge. This was a request for a capacity evaluation for pt decision making regarding SNF. Based on assessment, pt DOES have decision making capacity regarding SNF.  Disposition: No evidence of imminent risk to self or others at present.   Patient does not meet criteria for psychiatric inpatient admission. Patient DOES have decision making capacity regarding  SNF  Lauree Chandler, NP 05/25/2023 1:35 PM

## 2023-05-26 ENCOUNTER — Other Ambulatory Visit (HOSPITAL_COMMUNITY): Payer: Self-pay

## 2023-05-26 ENCOUNTER — Encounter
Admission: EM | Disposition: A | Discharge status: Skilled Nursing Facility | Payer: Self-pay | Source: Home / Self Care | Attending: Internal Medicine

## 2023-05-26 DIAGNOSIS — L97509 Non-pressure chronic ulcer of other part of unspecified foot with unspecified severity: Secondary | ICD-10-CM | POA: Diagnosis not present

## 2023-05-26 DIAGNOSIS — I7025 Atherosclerosis of native arteries of other extremities with ulceration: Secondary | ICD-10-CM | POA: Diagnosis not present

## 2023-05-26 HISTORY — PX: LOWER EXTREMITY ANGIOGRAPHY: CATH118251

## 2023-05-26 LAB — BASIC METABOLIC PANEL
Anion gap: 8 (ref 5–15)
BUN: 18 mg/dL (ref 8–23)
CO2: 22 mmol/L (ref 22–32)
Calcium: 8.4 mg/dL — ABNORMAL LOW (ref 8.9–10.3)
Chloride: 108 mmol/L (ref 98–111)
Creatinine, Ser: 1.22 mg/dL (ref 0.61–1.24)
GFR, Estimated: >60 mL/min (ref >60)
Glucose, Bld: 133 mg/dL — ABNORMAL HIGH (ref 70–99)
Potassium: 3.5 mmol/L (ref 3.5–5.1)
Sodium: 138 mmol/L (ref 135–145)

## 2023-05-26 LAB — CBC WITH DIFFERENTIAL/PLATELET
Abs Immature Granulocytes: 0.22 10*3/uL — ABNORMAL HIGH (ref 0.00–0.07)
Basophils Absolute: 0.1 10*3/uL (ref 0.0–0.1)
Basophils Relative: 1 %
Eosinophils Absolute: 0.5 10*3/uL (ref 0.0–0.5)
Eosinophils Relative: 5 %
HCT: 23 % — ABNORMAL LOW (ref 39.0–52.0)
Hemoglobin: 7.5 g/dL — ABNORMAL LOW (ref 13.0–17.0)
Immature Granulocytes: 2 %
Lymphocytes Relative: 27 %
Lymphs Abs: 3 10*3/uL (ref 0.7–4.0)
MCH: 30.1 pg (ref 26.0–34.0)
MCHC: 32.6 g/dL (ref 30.0–36.0)
MCV: 92.4 fL (ref 80.0–100.0)
Monocytes Absolute: 1 10*3/uL (ref 0.1–1.0)
Monocytes Relative: 9 %
Neutro Abs: 6.2 10*3/uL (ref 1.7–7.7)
Neutrophils Relative %: 56 %
Platelets: 185 10*3/uL (ref 150–400)
RBC: 2.49 MIL/uL — ABNORMAL LOW (ref 4.22–5.81)
RDW: 17.7 % — ABNORMAL HIGH (ref 11.5–15.5)
Smear Review: NORMAL
WBC: 10.9 10*3/uL — ABNORMAL HIGH (ref 4.0–10.5)
nRBC: 0.2 % (ref 0.0–0.2)

## 2023-05-26 LAB — GLUCOSE, CAPILLARY
Glucose-Capillary: 134 mg/dL — ABNORMAL HIGH (ref 70–99)
Glucose-Capillary: 145 mg/dL — ABNORMAL HIGH (ref 70–99)
Glucose-Capillary: 125 mg/dL — ABNORMAL HIGH (ref 70–99)
Glucose-Capillary: 127 mg/dL — ABNORMAL HIGH (ref 70–99)
Glucose-Capillary: 166 mg/dL — ABNORMAL HIGH (ref 70–99)

## 2023-05-26 LAB — HEPARIN LEVEL (UNFRACTIONATED)
Heparin Unfractionated: 0.28 IU/mL — ABNORMAL LOW (ref 0.30–0.70)
Heparin Unfractionated: 0.52 IU/mL (ref 0.30–0.70)

## 2023-05-26 LAB — CULTURE, BLOOD (ROUTINE X 2): Special Requests: ADEQUATE

## 2023-05-26 LAB — AEROBIC/ANAEROBIC CULTURE W GRAM STAIN (SURGICAL/DEEP WOUND)

## 2023-05-26 SURGERY — LOWER EXTREMITY ANGIOGRAPHY
Anesthesia: Moderate Sedation | Laterality: Left

## 2023-05-26 MED ORDER — FENTANYL CITRATE PF 50 MCG/ML IJ SOSY
PREFILLED_SYRINGE | INTRAMUSCULAR | Status: AC
  Filled 2023-05-26: qty 1

## 2023-05-26 MED ORDER — MIDAZOLAM HCL 2 MG/2ML IJ SOLN
INTRAMUSCULAR | Status: DC | PRN
  Administered 2023-05-26 (×3): 1 mg via INTRAVENOUS

## 2023-05-26 MED ORDER — CEFAZOLIN SODIUM-DEXTROSE 2-4 GM/100ML-% IV SOLN
2.0000 g | INTRAVENOUS | Status: AC
  Administered 2023-05-26: 2 g via INTRAVENOUS

## 2023-05-26 MED ORDER — DOXYCYCLINE HYCLATE 100 MG PO TABS
100.0000 mg | ORAL_TABLET | Freq: Two times a day (BID) | ORAL | Status: AC
  Administered 2023-05-26 – 2023-06-02 (×14): 100 mg via ORAL
  Filled 2023-05-26 (×14): qty 1

## 2023-05-26 MED ORDER — SODIUM CHLORIDE 0.9 % IV SOLN: INTRAVENOUS | Status: DC

## 2023-05-26 MED ORDER — CLOPIDOGREL BISULFATE 75 MG PO TABS
75.0000 mg | ORAL_TABLET | Freq: Every day | ORAL | Status: DC
  Administered 2023-05-26 – 2023-06-03 (×9): 75 mg via ORAL
  Filled 2023-05-26 (×9): qty 1

## 2023-05-26 MED ORDER — METHYLPREDNISOLONE SODIUM SUCC 125 MG IJ SOLR: 125.0000 mg | Freq: Once | INTRAMUSCULAR | Status: DC | PRN

## 2023-05-26 MED ORDER — HYDROMORPHONE HCL 1 MG/ML IJ SOLN: 1.0000 mg | Freq: Once | INTRAMUSCULAR | Status: DC | PRN

## 2023-05-26 MED ORDER — MIDAZOLAM HCL 2 MG/ML PO SYRP: 8.0000 mg | ORAL_SOLUTION | Freq: Once | ORAL | Status: DC | PRN

## 2023-05-26 MED ORDER — CEFAZOLIN SODIUM-DEXTROSE 2-4 GM/100ML-% IV SOLN
INTRAVENOUS | Status: AC
  Filled 2023-05-26: qty 100

## 2023-05-26 MED ORDER — DIPHENHYDRAMINE HCL 50 MG/ML IJ SOLN: 50.0000 mg | Freq: Once | INTRAMUSCULAR | Status: DC | PRN

## 2023-05-26 MED ORDER — MIDAZOLAM HCL 2 MG/2ML IJ SOLN
INTRAMUSCULAR | Status: AC
  Filled 2023-05-26: qty 2

## 2023-05-26 MED ORDER — FENTANYL CITRATE PF 50 MCG/ML IJ SOSY: 12.5000 ug | PREFILLED_SYRINGE | Freq: Once | INTRAMUSCULAR | Status: DC | PRN

## 2023-05-26 MED ORDER — ONDANSETRON HCL 4 MG/2ML IJ SOLN: 4.0000 mg | Freq: Four times a day (QID) | INTRAMUSCULAR | Status: DC | PRN

## 2023-05-26 MED ORDER — HEPARIN SODIUM (PORCINE) 1000 UNIT/ML IJ SOLN
INTRAMUSCULAR | Status: DC | PRN
  Administered 2023-05-26: 4000 [IU] via INTRAVENOUS

## 2023-05-26 MED ORDER — FAMOTIDINE 20 MG PO TABS: 40.0000 mg | ORAL_TABLET | Freq: Once | ORAL | Status: DC | PRN

## 2023-05-26 MED ORDER — HEPARIN BOLUS VIA INFUSION
1000.0000 [IU] | Freq: Once | INTRAVENOUS | Status: AC
  Administered 2023-05-26: 1000 [IU] via INTRAVENOUS
  Filled 2023-05-26: qty 1000

## 2023-05-26 MED ORDER — FENTANYL CITRATE (PF) 100 MCG/2ML IJ SOLN
INTRAMUSCULAR | Status: DC | PRN
  Administered 2023-05-26 (×3): 50 ug via INTRAVENOUS

## 2023-05-26 MED ORDER — IODIXANOL 320 MG/ML IV SOLN
INTRAVENOUS | Status: DC | PRN
  Administered 2023-05-26: 55 mL via INTRA_ARTERIAL

## 2023-05-26 MED ORDER — HEPARIN SODIUM (PORCINE) 1000 UNIT/ML IJ SOLN
INTRAMUSCULAR | Status: AC
  Filled 2023-05-26: qty 10

## 2023-05-26 MED ORDER — HEPARIN (PORCINE) IN NACL 1000-0.9 UT/500ML-% IV SOLN
INTRAVENOUS | Status: DC | PRN
  Administered 2023-05-26: 1000 mL

## 2023-05-26 MED ORDER — LIDOCAINE-EPINEPHRINE (PF) 1 %-1:200000 IJ SOLN
INTRAMUSCULAR | Status: DC | PRN
  Administered 2023-05-26: 10 mL via INTRADERMAL

## 2023-05-26 SURGICAL SUPPLY — 30 items
BALLN LUTONIX 018 4X100X130 (BALLOONS) ×1
BALLN LUTONIX 018 5X300X130 (BALLOONS) ×2
BALLN ULTRV 018 3X100X75 (BALLOONS) ×1
BALLOON LUTONIX 018 4X100X130 (BALLOONS) IMPLANT
BALLOON LUTONIX 018 5X300X130 (BALLOONS) IMPLANT
BALLOON ULTRV 018 3X100X75 (BALLOONS) IMPLANT
CATH ANGIO 5F PIGTAIL 65CM (CATHETERS) IMPLANT
CATH BEACON 5 .035 65 KMP TIP (CATHETERS) IMPLANT
CATH BEACON 5 .038 100 VERT TP (CATHETERS) IMPLANT
CATH NAVICROSS ANGLED 90CM (MICROCATHETER) IMPLANT
CATH SEEKER .035X90 (CATHETERS) IMPLANT
COVER PROBE ULTRASOUND 5X96 (MISCELLANEOUS) IMPLANT
DEVICE RAD COMP TR BAND LRG (VASCULAR PRODUCTS) IMPLANT
DRAPE INCISE IOBAN 66X45 STRL (DRAPES) IMPLANT
GLIDEWIRE ADV .035X260CM (WIRE) IMPLANT
GOWN STRL REUS W/ TWL XL LVL3 (GOWN DISPOSABLE) IMPLANT
GOWN STRL REUS W/TWL 2XL LVL3 (GOWN DISPOSABLE) IMPLANT
GOWN STRL REUS W/TWL XL LVL3 (GOWN DISPOSABLE) ×1
GUIDEWIRE ADV .018X180CM (WIRE) IMPLANT
KIT ENCORE 26 ADVANTAGE (KITS) IMPLANT
KIT MICROPUNCTURE NIT STIFF (SHEATH) IMPLANT
PACK ANGIOGRAPHY (CUSTOM PROCEDURE TRAY) ×1 IMPLANT
SHEATH BRITE TIP 5FRX11 (SHEATH) IMPLANT
SHEATH HALO 035 6FRX10 (SHEATH) IMPLANT
STENT VIABAHN 6X100X120 (Permanent Stent) IMPLANT
STENT VIABAHN 6X250X120 (Permanent Stent) IMPLANT
SYR MEDRAD MARK 7 150ML (SYRINGE) IMPLANT
TUBING CONTRAST HIGH PRESS 72 (TUBING) IMPLANT
WIRE G V18X300CM (WIRE) IMPLANT
WIRE GUIDERIGHT .035X150 (WIRE) IMPLANT

## 2023-05-26 NOTE — Plan of Care (Signed)
  Problem: Education: Goal: Knowledge of General Education information will improve Description: Including pain rating scale, medication(s)/side effects and non-pharmacologic comfort measures Outcome: Progressing   Problem: Health Behavior/Discharge Planning: Goal: Ability to manage health-related needs will improve Outcome: Progressing   Problem: Clinical Measurements: Goal: Respiratory complications will improve Outcome: Progressing Goal: Cardiovascular complication will be avoided Outcome: Progressing   Problem: Nutrition: Goal: Adequate nutrition will be maintained Outcome: Progressing   Problem: Elimination: Goal: Will not experience complications related to bowel motility Outcome: Progressing Goal: Will not experience complications related to urinary retention Outcome: Progressing   Problem: Pain Managment: Goal: General experience of comfort will improve Outcome: Progressing

## 2023-05-26 NOTE — Progress Notes (Signed)
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 pmh of CVA, HTN, tobacco use, PAD, hypergliceridemia, left renal stenosis, DLD, depression and heart failure.    PT Comments    Patient laying in bed at start of session. Forefoot offloading shoe in room per doctors orders, instructed patient on donning/doffing the shoe. Donned prosthesis sitting EOB independently. Independent with supine<>sit, min assist for sit<>stand with RW, min assist for ambulation will RW. Ambulated 10 ft in room while maintaining PWB in LLE. Patient returned to chair with alarm set and call bell. Communicated with nursing mobility status and use of post-op orthotic.    Recommendations for follow up therapy are one component of a multi-disciplinary discharge planning process, led by the attending physician.  Recommendations may be updated based on patient status, additional functional criteria and insurance authorization.  Follow Up Recommendations       Assistance Recommended at Discharge Intermittent Supervision/Assistance  Patient can return home with the following A little help with walking and/or transfers;Assistance with cooking/housework;Assist for transportation;Help with stairs or ramp for entrance   Equipment Recommendations  Rolling walker (2 wheels)    Recommendations for Other Services       Precautions / Restrictions Precautions Precautions: Fall Required Braces or Orthoses: Other Brace Other Brace: Forefoot off loading shoe Restrictions Weight Bearing Restrictions: Yes LLE Weight Bearing: Partial weight bearing     Mobility  Bed Mobility Overal bed mobility: Needs Assistance Bed Mobility: Supine to Sit     Supine to sit: Independent      General bed mobility comments: patient able to sit up from flat bed and scoot in bed independently.    Transfers Overall transfer level: Needs assistance Equipment used: Rolling walker (2 wheels) Transfers: Sit to/from Stand Sit to Stand: Min guard, From elevated surface           General transfer comment: Patient about to perform sit to stand while maintian PWB with min guard and RW. Instructed patient to use LLE as a tripod to avoid transfering too much weight through it.    Ambulation/Gait Ambulation/Gait assistance: Min assist Gait Distance (Feet): 10 Feet Assistive device: Rolling walker (2 wheels) Gait Pattern/deviations: Step-to pattern, Decreased step length - right, Decreased step length - left, Narrow base of support, Trunk flexed, Decreased weight shift to left       General Gait Details: Patient able to ambulate with RW and forefoot offloading shoe. Min assist needed for balance with WBing status.   Stairs             Wheelchair Mobility    Modified Rankin (Stroke Patients Only)       Balance Overall balance assessment: Needs assistance Sitting-balance support: Feet supported, No upper extremity supported Sitting balance-Leahy Scale: Normal Sitting balance - Comments: Sitting EOB to don prosthesis.   Standing balance support: Bilateral upper extremity supported, During functional activity, Reliant on assistive device for balance Standing balance-Leahy Scale: Fair Standing balance comment: Patient able to maintain balance while reliant on RW. Unable to weight shift due to precautions.                            Cognition Arousal/Alertness: Awake/alert Behavior During Therapy: WFL for tasks assessed/performed Overall Cognitive Status:  Within Functional Limits for tasks assessed                                          Exercises      General Comments        Pertinent Vitals/Pain Pain Assessment Pain  Assessment: No/denies pain    Home Living                          Prior Function            PT Goals (current goals can now be found in the care plan section) Acute Rehab PT Goals PT Goal Formulation: With patient Time For Goal Achievement: 06/08/23 Potential to Achieve Goals: Good Progress towards PT goals: Progressing toward goals    Frequency    Min 3X/week      PT Plan Current plan remains appropriate    Co-evaluation              AM-PAC PT "6 Clicks" Mobility   Outcome Measure  Help needed turning from your back to your side while in a flat bed without using bedrails?: None Help needed moving from lying on your back to sitting on the side of a flat bed without using bedrails?: None Help needed moving to and from a bed to a chair (including a wheelchair)?: A Little Help needed standing up from a chair using your arms (e.g., wheelchair or bedside chair)?: A Little Help needed to walk in hospital room?: A Little Help needed climbing 3-5 steps with a railing? : A Lot 6 Click Score: 19    End of Session Equipment Utilized During Treatment: Gait belt Activity Tolerance: Patient tolerated treatment well Patient left: in chair;with call bell/phone within reach;with chair alarm set Nurse Communication: Mobility status;Weight bearing status PT Visit Diagnosis: Unsteadiness on feet (R26.81);Other abnormalities of gait and mobility (R26.89);Difficulty in walking, not elsewhere classified (R26.2);History of falling (Z91.81)     Time: 0912-0928 PT Time Calculation (min) (ACUTE ONLY): 16 min  Charges:                        Malachi Carl, SPT    Malachi Carl 05/26/2023, 9:44 AM

## 2023-05-26 NOTE — Consult Note (Signed)
PHARMACY CONSULT NOTE  Pharmacy Consult for Electrolyte Monitoring and Replacement   Recent Labs: Potassium (mmol/L)  Date Value  05/26/2023 3.5   Magnesium (mg/dL)  Date Value  16/09/9603 1.8   Calcium (mg/dL)  Date Value  54/08/8118 8.4 (L)   Albumin (g/dL)  Date Value  14/78/2956 3.8   Phosphorus (mg/dL)  Date Value  21/30/8657 3.6   Sodium (mmol/L)  Date Value  05/26/2023 138  02/03/2016 139   Assessment: 72 y/o M with medical history including basal cell carcinoma, anxiety / depression, PAD s/p right BKA, CVA, HTN, HLD admitted with OM left first and third toes s/p amputation and bilateral common iliac artery stenting for left lower extremity limb threatening ischemia. Pharmacy consulted to assist with electrolyte monitoring and replacement as indicated.  Goal of Therapy:  Electrolytes within normal limits  Plan:  Elelectroyltes WNL this morning. No replacement needed at this time. Pharmacy will continue to monitor and replacement electrolytes as needed.   Gardner Candle, PharmD, BCPS Clinical Pharmacist 05/26/2023 7:31 AM

## 2023-05-26 NOTE — OR Nursing (Signed)
Releasing air from band at slightly accelerated rate  to reduced risk of causing claudication. Last dose heparin 1722. Site stable with no sign of bleeding.

## 2023-05-26 NOTE — Progress Notes (Signed)
PT Contact Note     Durable Medical Equipment  (From admission, onward)           Start     Ordered   05/26/23 1508  For home use only DME Bedside commode  Once       Question:  Patient needs a bedside commode to treat with the following condition  Answer:  Generalized weakness   05/26/23 1507   05/26/23 1507  For home use only DME Walker rolling  Once       Question Answer Comment  Walker: With 5 Inch Wheels   Patient needs a walker to treat with the following condition Generalized weakness      05/26/23 1506            Patient is confined to a single room and not able to walk the distance required to go the bathroom, or he/she is unable to safely negotiate stairs required to access the bathroom.  A 3in1 BSC will alleviate this problem  Raahil Ong H. Manson Passey, PT, DPT, NCS 05/26/23, 3:08 PM [phone]

## 2023-05-26 NOTE — Op Note (Signed)
[facility]  Percutaneous Study/Intervention Procedural Note   Date of Surgery:  05/26/2023  Surgeon(s):Carolyn Maniscalco    Assistants:none  Pre-operative Diagnosis: PAD with ulceration and infection LLE  Post-operative diagnosis:  Same  Procedure(s) Performed:             1.  Ultrasound guidance for vascular access left posterior tibial artery             2.  Catheter placement into left common femoral artery from left posterior tibial approach             3.  Selective left lower extremity angiogram             4.  Percutaneous transluminal angioplasty of left posterior tibial artery and tibioperoneal trunk with 3 and 4 mm diameter angioplasty balloons             5.  Percutaneous transluminal angioplasty of the entire left SFA and popliteal arteries with 5 mm diameter Lutonix drug-coated angioplasty balloons  6.  Stent placement to the left tibioperoneal trunk and popliteal artery with 6 mm diameter by 25 cm length Viabahn stent             7.  Stent placement to the left SFA and popliteal arteries with 6 mm diameter by 25 cm length 6 mm diameter by 10 cm length Viabahn stent  EBL: 10 cc  Contrast: 55 cc  Fluoro Time: 13.1 minutes  Moderate Conscious Sedation Time: approximately 92 minutes using 3 mg of Versed and 150 mcg of Fentanyl              Indications:  Patient is a 72 y.o.male with infection and nonhealing ulceration of the left foot and toes.  He is already lost the right leg and multiple toes on the left foot.  He has had a previous angiogram and his kissing iliac stents up to the aorta make up and over approach difficult or impossible so we are coming back for evaluation from a pedal approach. The patient is brought in for angiography for further evaluation and potential treatment.  Due to the limb threatening nature of the situation, angiogram was performed for attempted limb salvage. The patient is aware that if the procedure fails, amputation would be  expected.  The patient also understands that even with successful revascularization, amputation may still be required due to the severity of the situation.  Risks and benefits are discussed and informed consent is obtained.   Procedure:  The patient was identified and appropriate procedural time out was performed.  The patient was then placed supine on the table and prepped and draped in the usual sterile fashion. Moderate conscious sedation was administered during a face to face encounter with the patient throughout the procedure with my supervision of the RN administering medicines and monitoring the patient's vital signs, pulse oximetry, telemetry and mental status throughout from the start of the procedure until the patient was taken to the recovery room. Ultrasound was used to evaluate the left posterior tibial artery.  This was accessed in the area where it was patent several centimeters above the left ankle.  This was done with a micropuncture needle under direct ultrasound guidance and a permanent image was recorded.  This was done with some difficulty due to the small vessel and occlusion distally making access at the level of the ankle possible.  Micropuncture wire and sheath were then placed.  A 0.035 J wire was advanced without resistance and  a 6Fr slender sheath was placed.  Imaging showed occlusion of the proximal posterior tibial artery and tibioperoneal trunk with a known occlusion of the SFA and popliteal arteries.  I then used a 0.018 advantage wire and a variety of catheters including a Kumpe catheter, a Nava cross catheter, and a seeker catheter.  Tediously, I was able to get across the occlusion and confirm intraluminal flow in the left external iliac artery.  I then proceeded with treatment.  V18 wire was placed to the diagnostic catheter was removed.  I used a 3 mm diameter by 10 cm length angioplasty balloon to angioplasty the proximal posterior tibial artery and tibioperoneal trunk.  A 4  mm diameter Lutonix drug-coated angioplasty balloon was also used to treat the most proximal posterior tibial artery and tibioperoneal trunk.  A 5 mm diameter by 30 cm length Lutonix drug-coated angioplasty balloon was used to treat the entire left SFA and popliteal arteries with 2 inflations up to 10 atm with this balloon.  Completion imaging still showed near occlusive stenosis in the proximal SFA, near occlusive stenosis in the tibioperoneal trunk, and multiple areas of greater than 50% stenosis throughout the remainder of the SFA and popliteal arteries.  I then proceeded with stent placement.  I started with a 6 mm diameter by 25 cm length Viabahn stent in the left tibioperoneal trunk and left popliteal arteries.  This was followed by a 6 mm diameter by 25 cm length stent from the most distal SFA up to the proximal SFA finally a 6 mm diameter by 10 cm length Viabahn stent to the origin of the left SFA just below the origin of the profunda femoris artery.  These were postdilated with 5 mm diameter Lutonix drug-coated balloon throughout until the TP trunk which was treated with a 4 mm diameter Lutonix drug-coated balloon.  Completion imaging showed marked improvement with less than 20% residual stenosis throughout all areas treated when a catheter was placed back into the left common femoral artery for completion imaging.  The sheath was removed and TR band was placed at the left ankle access site.  The patient was taken to the recovery room in stable condition having tolerated the procedure well.  Findings:                          Left lower Extremity:  There was occlusion of the left SFA and popliteal arteries as well as the tibioperoneal trunk and proximal posterior tibial artery there were able to be treated with angioplasty and stent placement.   Disposition: Patient was taken to the recovery room in stable condition having tolerated the procedure well.  Complications: None  [name] 05/26/2023 6:09 PM   This note was created with Dragon Medical transcription system. Any errors in dictation are purely unintentional.[facility]  Percutaneous Study/Intervention Procedural Note

## 2023-05-26 NOTE — Consult Note (Signed)
ANTICOAGULATION CONSULT NOTE   Pharmacy Consult for Heparin Infusion Indication:  Lower extremity ischemia  Patient Measurements: Height: 6\' 1"  (185.4 cm) Weight: 64.4 kg (141 lb 15.6 oz) IBW/kg (Calculated) : 79.9 Heparin Dosing Weight: 64.4 kg  Labs: Recent Labs    05/24/23 0437 05/24/23 1100 05/25/23 0433 05/25/23 1233 05/25/23 1802 05/26/23 0342 05/26/23 1104  HGB 7.9*  --  7.3*  --   --  7.5*  --   HCT 23.7*  --  22.3*  --   --  23.0*  --   PLT 169  --  161  --   --  185  --   HEPARINUNFRC 0.32   < > 0.26*   < > 0.36 0.28* 0.52  CREATININE 1.34*  --  1.24  --   --  1.22  --    < > = values in this interval not displayed.    Estimated Creatinine Clearance: 51.3 mL/min (by C-G formula based on SCr of 1.22 mg/dL).  Medical History: Past Medical History:  Diagnosis Date   Basal cell carcinoma 08/11/2022   R forearm - ED&C   Depression    Foot ulcer (HCC) 12/12/2016   Hyperlipidemia    Hypertension    PAD (peripheral artery disease) (HCC) ~2007   s/p R BKA for non-healing wound   Stroke (HCC) 03/2014   MRI: Acute nonhemorrhagic left paracentral pontine infarct. 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   TIA (transient ischemic attack) 01/2014    Medications:  No prior anticoagulation noted   Assessment: 72 year old male with history of hypertension, hyperlipidemia, depression, TIA, R BKA who presents to the emergency department for chief concerns of left foot pain and redness with swelling. Pt with history of prior lower extremity stents placed 02/2023. Pharmacy has been consulted to initiate and manage IV heparin therapy for lower extremity aterial occlusion   Goal of Therapy:  Heparin level 0.3-0.7 units/ml Monitor platelets by anticoagulation protocol: Yes   0615 0657 HL 0.32, therapeutic x 1 0616 0420 HL 0.15, subtherapeutic x 1 0616 1408 HL 0.19,  subtherapeutic 0616 2313 HL  0.29, subtherapeutic  0617 0755 HL 0.38, therapeutic x 1 0617 1341 HL 0.12, subtherapeutic 0617 1928 HL 0.25, subtherapeutic 0618 0437 HL 0.32, therapeutic X 1  0618 1100 HL 0.38, therapeutic x 2 0619 0433 HL 0.26, SUBtherapeutic  0619 1233 HL 0.40, therapeutic x 1 0619 1802 HL 0.36, therapeutic x 2 0620 0342 HL 0.28, SUBtherapeutic  0620 1104 HL 0.52, therapeutic x1  Plan: 0620 1104 HL 0.52, therapeutic x1 - Continue heparin infusion at rate of 2000 units/hr.  --Re-check confirmatory HL in 6 hrs - CBC daily per protocol   Gardner Candle, PharmD, BCPS Clinical Pharmacist 05/26/2023 11:30 AM

## 2023-05-26 NOTE — Progress Notes (Signed)
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 pmh of CVA, HTN, tobacco use, PAD, hypergliceridemia, left renal stenosis, DLD, depression and heart failure.   Clinical Impression   Patient presenting with generalized deconditioning, decreased knowledge of precautions on LLE, impaired functional mobility and ADL performance. Patient lives alone, sister provides assistance for IADLs including driving and checks on him daily PTA. Pt currently requires increased verbal cuing for safety, adherence to precautions on LLE and hand placement. Attempts to perform standing from recliner pulling back on RW, OT cues pt to push up from chair and use LLE as tripod to maintain WB precautions. Overall, requires mod assist and cuing for safe performance. Pt completes short bout of functional mobility, turning around in room before c/o slight dizziness. Returns to recliner, MODA for safety. Anticipate pt would need setup for ADLs and minA for LB ADLs at this time. Patient will benefit from acute OT to increase overall independence in the areas of ADLs, functional mobility, decrease caregiver burden and minimize the risk of falls in order to safely discharge.       Recommendations for follow up therapy are one component of a multi-disciplinary discharge planning process, led by the attending physician.  Recommendations may be updated based on patient status, additional functional criteria and insurance authorization.   Assistance Recommended at Discharge Intermittent Supervision/Assistance  Patient can return home with the following A little help with walking and/or transfers;A little help with bathing/dressing/bathroom;Direct supervision/assist for medications  management;Direct supervision/assist for financial management;Assistance with cooking/housework    Functional Status Assessment  Patient has had a recent decline in their functional status and demonstrates the ability to make significant improvements in function in a reasonable and predictable amount of time.  Equipment Recommendations  BSC/3in1       Precautions / Restrictions Precautions Precautions: Fall Required Braces or Orthoses: Other Brace Other Brace: Forefoot off loading shoe Restrictions Weight Bearing Restrictions: Yes LLE Weight Bearing: Partial weight bearing      Mobility Bed Mobility                    Transfers Overall transfer level: Needs assistance Equipment used: Rolling walker (2 wheels) Transfers: Sit to/from Stand Sit to Stand: Mod assist, Min assist           General transfer comment: Pt requires cuing to recall precautions, able to perform STS from recliner with MIN-MOD A, cues needed to push up from chair as pt attempts to pull back on RW, used tripod on LLE as instructed      Balance Overall balance assessment: Needs assistance Sitting-balance support: Feet supported, No upper extremity supported Sitting balance-Leahy Scale: Normal     Standing balance support: Bilateral upper extremity supported, During functional activity, Reliant on assistive device for balance Standing balance-Leahy Scale: Fair Standing balance comment: Reliant on RW for balance                           ADL either performed or assessed with clinical judgement   ADL  General ADL Comments: Anticipate pt would require setup for UB ADLs and minA for LB ADLs. Able to don prothesis and surgical shoe IND.      Pertinent Vitals/Pain Pain Assessment Pain Assessment: No/denies pain     Hand Dominance Right   Extremity/Trunk Assessment             Communication  Communication Communication: No difficulties   Cognition Arousal/Alertness: Awake/alert Behavior During Therapy: WFL for tasks assessed/performed Overall Cognitive Status: Within Functional Limits for tasks assessed                                                  Home Living Family/patient expects to be discharged to:: Private residence Living Arrangements: Alone Available Help at Discharge: Family;Available PRN/intermittently Type of Home: House Home Access: Stairs to enter [employer] of Steps: 5 Entrance Stairs-Rails: Right;Left Home Layout: One level     Bathroom Shower/Tub: Chief Strategy Officer: Handicapped height Bathroom Accessibility: Yes              Prior Functioning/Environment Prior Level of Function : Needs assist       Physical Assist : ADLs (physical)   ADLs (physical): IADLs Mobility Comments: Mod-I with SPC and R prosthetic in community. No AD use at home. ADLs Comments: Pt reports independent of ADLs, sister/son help with groceries and getting to appointments.        OT Problem List: Decreased strength;Decreased activity tolerance;Decreased range of motion;Impaired balance (sitting and/or standing);Decreased safety awareness;Decreased knowledge of use of DME or AE;Decreased knowledge of precautions      OT Treatment/Interventions: Self-care/ADL training;Therapeutic exercise;DME and/or AE instruction;Therapeutic activities;Balance training;Patient/family education    OT Goals(Current goals can be found in the care plan section) Acute Rehab OT Goals Patient Stated Goal: To go home OT Goal Formulation: With patient Time For Goal Achievement: 06/09/23 Potential to Achieve Goals: Good  OT Frequency: Min 1X/week       AM-PAC OT "6 Clicks" Daily Activity     Outcome Measure Help from another person eating meals?: None Help from another person taking care of personal grooming?: None Help from another  person toileting, which includes using toliet, bedpan, or urinal?: A Little Help from another person bathing (including washing, rinsing, drying)?: A Little Help from another person to put on and taking off regular upper body clothing?: A Little Help from another person to put on and taking off regular lower body clothing?: A Little 6 Click Score: 20   End of Session Equipment Utilized During Treatment: Gait belt;Rolling walker (2 wheels) Nurse Communication: Mobility status;Other (comment) (c/o dizziness)  Activity Tolerance: Other (comment) (Pt with c/o dizziness during short turn around room ambulation with RW, BP 130/63, RN aware) Patient left: in chair;with call bell/phone within reach;with chair alarm set;with nursing/sitter in room  OT Visit Diagnosis: Other abnormalities of gait and mobility (R26.89);Muscle weakness (generalized) (M62.81);Unsteadiness on feet (R26.81)                Time: 8119-1478 OT Time Calculation (min): 23 min Charges:  OT General Charges $OT Visit: 1 Visit OT Evaluation $OT Eval Low Complexity: 1 Low OT Treatments $Self Care/Home Management : 8-22 mins  Leyanna Bittman L. Kamori Kitchens, OTR/L  05/26/23, 1:15 PM

## 2023-05-26 NOTE — Progress Notes (Addendum)
Progress Note    Jordan Arellano  WUJ:[id number] DOB: 05-12-1951  DOA: 05/18/2023 PCP: Mort Sawyers, FNP      Brief Narrative:    Medical records reviewed and are as summarized below:  Jordan Arellano is a 72 y.o. male hypertension, hyperlipidemia, depression, TIA, stroke, PAD, presented to the hospital with left foot pain associated with redness and swelling.  He was found to have osteomyelitis of the left first and third toes.  He was treated with empiric IV antibiotics.  He underwent stent placement to right common iliac artery and right external iliac artery on 05/20/2023.  He also underwent amputation of left great toe and amputation of left third toe.  He developed persistent hypotension which did not respond to IV fluids.  This necessitated transfer to the ICU for vasopressors for septic shock.        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) Active Problems:   Essential hypertension   Dyslipidemia   Depression   History of CVA (cerebrovascular accident) without residual deficits   Tobacco abuse   PAD s/p right BKA, history revascularization left leg   Hypertriglyceridemia   Status post femoral-popliteal bypass surgery   Left renal artery stenosis (HCC)   Recurrent major depressive disorder, in full remission (HCC)   Heart failure with preserved ejection fraction (HCC)   Encounter for tobacco use cessation counseling   At risk for constipation   Acute hematogenous osteomyelitis of left foot (HCC)   Protein-calorie malnutrition, severe   Nutrition Problem: Severe Malnutrition Etiology: social / environmental circumstances  Signs/Symptoms: mild fat depletion, moderate fat depletion, moderate muscle depletion, severe muscle depletion, percent weight loss Percent weight loss: 8.4 %   Body mass index is 18.73 kg/m.   Septic shock secondary to acute osteomyelitis of left first and third  toes,? septic arthritis left great toe IP joint: Sepsis physiology has resolved. Left foot culture wound showed Staphylococcus lugdunensis.  No growth on blood culture.  Change IV vancomycin to oral doxycycline.  Discussed with Dr. Alberteen Spindle, podiatrist, via secure chat.  He said 1 week course of antibiotics is okay.  Patient will continue doxycycline for another week. Pathology report: Left great toe and third toe with soft tissue necrosis and acute osteomyelitis. Viable soft tissue and articular surface of bone at resection margins negative for acute osteomyelitis.   PAD, ischemic limb of left lower extremity: He remains on IV heparin drip.  Monitor heparin level per protocol.  Plan for lower extremity angiogram today.  Follow-up with vascular surgeon.  S/p stent placement to right common iliac artery and right external iliac artery on 05/20/2023   Anemia of chronic disease: H&H is stable.  No indication for blood transfusion at this time.   Type II DM: NovoLog as needed for hyperglycemia.   History of stroke: Continue aspirin.  Plavix on hold.   Other comorbidities include hypertension, hyperlipidemia   Diet Order             Diet NPO time specified Except for: Sips with Meds  Diet effective midnight                            Consultants: Vascular surgeon Intensivist Podiatrist  Procedures: Amputation left great toe at the IPJ, amputation left third toe metatarsophalangeal joint on 05/21/2023 Stent placement to right common iliac artery and right external iliac artery on  05/20/2023   Medications:    vitamin C  500 mg Oral BID   aspirin EC  81 mg Oral Daily   Chlorhexidine Gluconate Cloth  6 each Topical Daily   cyanocobalamin  1,000 mcg Oral Daily   feeding supplement  237 mL Oral TID BM   folic acid  1 mg Oral Daily   insulin aspart  0-15 Units Subcutaneous TID WC   insulin aspart  0-5 Units Subcutaneous QHS   latanoprost  1 drop Both Eyes QHS    multivitamin with minerals  1 tablet Oral Daily   polyethylene glycol  17 g Oral Daily   rosuvastatin  40 mg Oral QHS   sertraline  100 mg Oral Daily   zinc sulfate  220 mg Oral Daily   Continuous Infusions:  sodium chloride Stopped (05/21/23 0815)   sodium chloride 75 mL/hr at 05/26/23 0700   heparin 2,000 Units/hr (05/26/23 0506)   vancomycin 1,000 mg (05/25/23 1055)     Anti-infectives (From admission, onward)    Start     Dose/Rate Route Frequency Ordered Stop   05/22/23 0830  vancomycin (VANCOCIN) IVPB 1000 mg/200 mL premix        1,000 mg 200 mL/hr over 60 Minutes Intravenous Every 24 hours 05/22/23 0734     05/21/23 1032  vancomycin variable dose per unstable renal function (pharmacist dosing)  Status:  Discontinued         Does not apply See admin instructions 05/21/23 1032 05/22/23 0734   05/20/23 1400  ceFAZolin (ANCEF) IVPB 2g/100 mL premix        2 g 200 mL/hr over 30 Minutes Intravenous 30 min pre-op 05/20/23 1400 05/20/23 1552   05/19/23 2000  vancomycin (VANCOCIN) IVPB 1000 mg/200 mL premix  Status:  Discontinued        1,000 mg 200 mL/hr over 60 Minutes Intravenous Every 24 hours 05/18/23 1827 05/19/23 1034   05/19/23 2000  vancomycin (VANCOREADY) IVPB 1250 mg/250 mL  Status:  Discontinued        1,250 mg 166.7 mL/hr over 90 Minutes Intravenous Every 24 hours 05/19/23 1034 05/21/23 1032   05/18/23 1900  vancomycin (VANCOREADY) IVPB 1500 mg/300 mL        1,500 mg 150 mL/hr over 120 Minutes Intravenous  Once 05/18/23 1827 05/18/23 2120   05/18/23 1830  ceFEPIme (MAXIPIME) 2 g in sodium chloride 0.9 % 100 mL IVPB  Status:  Discontinued        2 g 200 mL/hr over 30 Minutes Intravenous Every 12 hours 05/18/23 1827 05/24/23 1122   05/18/23 1400  cefTRIAXone (ROCEPHIN) 2 g in sodium chloride 0.9 % 100 mL IVPB        2 g 200 mL/hr over 30 Minutes Intravenous  Once 05/18/23 1347 05/18/23 1438              Family Communication/Anticipated D/C date and  plan/Code Status   DVT prophylaxis: Place TED hose Start: 05/18/23 1434     Code Status: Full Code  Family Communication: None Disposition Plan: Plan to discharge home when cleared by vascular surgeon/podiatrist   Status is: Inpatient Remains inpatient appropriate because: Plan for lower extremity angiogram       Subjective:   Interval events noted.  He is sitting up in the chair.  He has no complaints.  No pain in the lower extremities.  No shortness of breath or chest pain.  Objective:    Vitals:   05/25/23 0811 05/25/23 1730 05/25/23 2134  05/26/23 0832  BP: 116/78 117/66 (!) 120/58 124/60  Pulse: 86 73 75 73  Resp: 16 16 20 16   Temp: 99.9 F (37.7 C) 98.2 F (36.8 C) 98 F (36.7 C) 98.3 F (36.8 C)  TempSrc:      SpO2: 96% 97% 94% 99%  Weight:      Height:       No data found.   Intake/Output Summary (Last 24 hours) at 05/26/2023 1037 Last data filed at 05/25/2023 2300 Gross per 24 hour  Intake --  Output 1250 ml  Net -1250 ml   Filed Weights   05/18/23 1138 05/19/23 2354 05/20/23 1444  Weight: 64.4 kg 64.4 kg 64.4 kg    Exam:  GEN: NAD SKIN: Warm and dry EYES: No pallor or icterus ENT: MMM CV: RRR PULM: CTA B ABD: soft, ND, NT, +BS CNS: AAO x 3, non focal EXT: Right BKA with prosthetic leg in place.  Dressing on left foot surgical wound is clean, dry and intact.  Offloading shoe on left foot.       Data Reviewed:   I have personally reviewed following labs and imaging studies:  Labs: Labs show the following:   Basic Metabolic Panel: Recent Labs  Lab 05/20/23 0710 05/21/23 0524 05/22/23 0420 05/23/23 0529 05/24/23 0437 05/25/23 0433 05/26/23 0342  NA 140 138 135 136 133* 136 138  K 4.3 3.5 3.3* 3.9 3.4* 3.8 3.5  CL 111 111 108 109 105 109 108  CO2 20* 17* 20* 20* 20* 20* 22  GLUCOSE 118* 125* 141* 120* 180* 126* 133*  BUN 25* 27* 28* 20 19 17 18   CREATININE 1.12 1.53* 1.39*  1.41* 1.29* 1.34* 1.24 1.22  CALCIUM 8.9 8.7*  8.5* 8.5* 8.2* 8.0* 8.4*  MG 2.0 1.3*  --  1.6* 2.1 1.8  --    GFR Estimated Creatinine Clearance: 51.3 mL/min (by C-G formula based on SCr of 1.22 mg/dL). Liver Function Tests: No results for input(s): "AST", "ALT", "ALKPHOS", "BILITOT", "PROT", "ALBUMIN" in the last 168 hours.  No results for input(s): "LIPASE", "AMYLASE" in the last 168 hours. No results for input(s): "AMMONIA" in the last 168 hours. Coagulation profile No results for input(s): "INR", "PROTIME" in the last 168 hours.   CBC: Recent Labs  Lab 05/20/23 2258 05/21/23 0524 05/22/23 0420 05/23/23 0529 05/24/23 0437 05/25/23 0433 05/26/23 0342  WBC 8.9   < > 14.7* 10.2 8.2 9.9 10.9*  NEUTROABS 8.4*  --   --   --   --   --  6.2  HGB 10.1*   < > 8.8* 7.9* 7.9* 7.3* 7.5*  HCT 31.7*   < > 27.1* 24.4* 23.7* 22.3* 23.0*  MCV 93.8   < > 92.2 91.0 90.8 91.4 92.4  PLT 218   < > 190 179 169 161 185   < > = values in this interval not displayed.   Cardiac Enzymes: No results for input(s): "CKTOTAL", "CKMB", "CKMBINDEX", "TROPONINI" in the last 168 hours. BNP (last 3 results) No results for input(s): "PROBNP" in the last 8760 hours. CBG: Recent Labs  Lab 05/25/23 0807 05/25/23 1155 05/25/23 1639 05/25/23 2131 05/26/23 0932  GLUCAP 119* 180* 128* 147* 134*   D-Dimer: No results for input(s): "DDIMER" in the last 72 hours. Hgb A1c: No results for input(s): "HGBA1C" in the last 72 hours. Lipid Profile: No results for input(s): "CHOL", "HDL", "LDLCALC", "TRIG", "CHOLHDL", "LDLDIRECT" in the last 72 hours. Thyroid function studies: No results for input(s): "TSH", "T4TOTAL", "  T3FREE", "THYROIDAB" in the last 72 hours.  Invalid input(s): "FREET3" Anemia work up: No results for input(s): "VITAMINB12", "FOLATE", "FERRITIN", "TIBC", "IRON", "RETICCTPCT" in the last 72 hours. Sepsis Labs: Recent Labs  Lab 05/20/23 2152 05/20/23 2258 05/20/23 2353 05/21/23 0524 05/23/23 0529 05/24/23 0437 05/25/23 0433  05/26/23 0342  WBC  --    < >  --    < > 10.2 8.2 9.9 10.9*  LATICACIDVEN 2.2*  --  1.6  --   --   --   --   --    < > = values in this interval not displayed.    Microbiology Recent Results (from the past 240 hour(s))  Culture, blood (Routine X 2) w Reflex to ID Panel     Status: None   Collection Time: 05/20/23 10:58 PM   Specimen: BLOOD  Result Value Ref Range Status   Specimen Description   Final    BLOOD BLOOD RIGHT ARM Performed at [facility], [address] [place], [place] [address]    Special Requests   Final    BOTTLES DRAWN AEROBIC AND ANAEROBIC Blood Culture adequate volume Performed at [facility], [address], [place], [place] [address]    Culture   Final    NO GROWTH 5 DAYS Performed at [facility], [address], [place], [place] [address]    Report Status 05/25/2023 FINAL  Final  Culture, blood (Routine X 2) w Reflex to ID Panel     Status: None   Collection Time: 05/20/23 11:53 PM   Specimen: BLOOD  Result Value Ref Range Status   Specimen Description BLOOD BLOOD LEFT ARM  Final   Special Requests   Final    BOTTLES DRAWN AEROBIC AND ANAEROBIC Blood Culture adequate volume   Culture   Final    NO GROWTH 5 DAYS Performed at [facility], [address], [place], [place] [address]    Report Status 05/26/2023 FINAL  Final  Aerobic/Anaerobic Culture w Gram Stain (surgical/deep wound)     Status: None (Preliminary result)   Collection Time: 05/21/23  9:13 AM   Specimen: Bone; Tissue  Result Value Ref Range Status   Specimen Description   Final    BONE Performed at [facility], [address] [place], [place] [address]    Special Requests   Final    NONE Performed at [facility], [address] [place], [place] [address]    Gram Stain   Final    MODERATE WBC PRESENT, PREDOMINANTLY PMN RARE GRAM POSITIVE COCCI RARE GRAM POSITIVE RODS CRITICAL RESULT CALLED TO, READ BACK BY AND  VERIFIED WITH: RN Georgian Co [id number] AT 1457 BY EC Performed at [facility], [address], [place], [place] [address]    Culture   Final    FEW STAPHYLOCOCCUS LUGDUNENSIS RARE DIPHTHEROIDS(CORYNEBACTERIUM SPECIES) Standardized susceptibility testing for this organism is not available. NO ANAEROBES ISOLATED; CULTURE IN PROGRESS FOR 5 DAYS    Report Status PENDING  Incomplete   Organism ID, Bacteria STAPHYLOCOCCUS LUGDUNENSIS  Final      Susceptibility   Staphylococcus lugdunensis - MIC*    CIPROFLOXACIN <=0.5 SENSITIVE Sensitive     ERYTHROMYCIN <=0.25 SENSITIVE Sensitive     GENTAMICIN <=0.5 SENSITIVE Sensitive     OXACILLIN >=4 RESISTANT Resistant     TETRACYCLINE <=1 SENSITIVE Sensitive     VANCOMYCIN <=0.5 SENSITIVE Sensitive     TRIMETH/SULFA <=10 SENSITIVE Sensitive  CLINDAMYCIN <=0.25 SENSITIVE Sensitive     RIFAMPIN <=0.5 SENSITIVE Sensitive     Inducible Clindamycin NEGATIVE Sensitive     * FEW STAPHYLOCOCCUS LUGDUNENSIS    Procedures and diagnostic studies:  No results found.             LOS: 8 days   Eden Toohey  [facility]   Pager on [email or web address]. If 7PM-7AM, please contact night-coverage at [email or web address]     05/26/2023, 10:37 AM

## 2023-05-26 NOTE — Interval H&P Note (Signed)
History and Physical Interval Note:  05/26/2023 4:17 PM  Jordan Arellano  has presented today for surgery, with the diagnosis of PAD.  The various methods of treatment have been discussed with the patient and family. After consideration of risks, benefits and other options for treatment, the patient has consented to  Procedure(s): Lower Extremity Angiography (Left) as a surgical intervention.  The patient's history has been reviewed, patient examined, no change in status, stable for surgery.  I have reviewed the patient's chart and labs.  Questions were answered to the patient's satisfaction.     [name]

## 2023-05-26 NOTE — TOC Benefit Eligibility Note (Signed)
Pharmacy Patient Advocate Encounter  Insurance verification completed.    The patient is insured through [employer] test claim for Linezolid and the current 30 day co-pay is $0.00.   This test claim was processed through [facility]- copay amounts may vary at other pharmacies due to pharmacy/plan contracts, or as the patient moves through the different stages of their insurance plan.

## 2023-05-26 NOTE — Plan of Care (Signed)

## 2023-05-26 NOTE — Progress Notes (Signed)
Nutrition Follow-up  DOCUMENTATION CODES:   Severe malnutrition in context of social or environmental circumstances  INTERVENTION:   -Once diet is resumed:  -Continue liberalized diet of regular -Continue MVI with minerals daily -Continue Ensure Enlive po TID, each supplement provides 350 kcal and 20 grams of protein.  -Continue 500 mg vitamin C BID -Continue 220 mg zinc sulfate daily x 14 days  NUTRITION DIAGNOSIS:   Severe Malnutrition related to social / environmental circumstances as evidenced by mild fat depletion, moderate fat depletion, moderate muscle depletion, severe muscle depletion, percent weight loss.  Ongoing  GOAL:   Patient will meet greater than or equal to 90% of their needs  Progressing   MONITOR:   PO intake, Supplement acceptance  REASON FOR ASSESSMENT:   Consult Assessment of nutrition requirement/status  ASSESSMENT:   Pt with history of hypertension, hyperlipidemia, depression, TIA, who presents for chief concerns of left foot pain and redness with swelling.  6/14- s/p lt lower extremity angiogram 6/15- s/p Amputation left great toe at the IPJ; Amputation left third toe metatarsophalangeal joint.  Reviewed I/O's: -1.8 L x 24 hours and +3 L since admission   Pt currently NPO for angiogram today.   Pt with erratic oral intake. Noted meal completions 0-75%. Pt also consuming outside food from family and drinking Ensure supplements.   Wt has been stable since admission.   Per psychiatry notes, pt with capacity to make medical decisions. SNF has been recommended, but pt unsure if he wants to go.   Medications reviewed and include vitamin C, vitamin B-12, folic acid, miralax, 0.9% sodium chloride infusion @ 75 ml/hr, and zinc sulfate.   Labs reviewed: CBGS: 125-145 (inpatient orders for glycemic control are 0-15 units insulin aspart TID with meals and 0-5 units insulin aspart daily at bedtime).    Diet Order:   Diet Order              Diet NPO time specified Except for: Sips with Meds  Diet effective midnight                   EDUCATION NEEDS:   Education needs have been addressed  Skin:  Skin Assessment: Skin Integrity Issues: Skin Integrity Issues:: Incisions Incisions: closed lt foot  Last BM:  05/22/23  Height:   Ht Readings from Last 1 Encounters:  05/26/23 6\' 1"  (1.854 m)    Weight:   Wt Readings from Last 1 Encounters:  05/26/23 64.4 kg    Ideal Body Weight:  78.2 kg (adjusted for rt BKA)  BMI:  Body mass index is 18.73 kg/m.  Estimated Nutritional Needs:   Kcal:  2050-2250  Protein:  115-130 grams  Fluid:  > 2 L    Levada Schilling, RD, LDN, CDCES Registered Dietitian II Certified Diabetes Care and Education Specialist Please refer to [facility] for RD and/or RD on-call/weekend/after hours pager

## 2023-05-26 NOTE — Consult Note (Signed)
ANTICOAGULATION CONSULT NOTE   Pharmacy Consult for Heparin Infusion Indication:  Lower extremity ischemia  Patient Measurements: Height: 6\' 1"  (185.4 cm) Weight: 64.4 kg (141 lb 15.6 oz) IBW/kg (Calculated) : 79.9 Heparin Dosing Weight: 64.4 kg  Labs: Recent Labs    05/24/23 0437 05/24/23 1100 05/25/23 0433 05/25/23 1233 05/25/23 1802 05/26/23 0342  HGB 7.9*  --  7.3*  --   --  7.5*  HCT 23.7*  --  22.3*  --   --  23.0*  PLT 169  --  161  --   --  185  HEPARINUNFRC 0.32   < > 0.26* 0.40 0.36 0.28*  CREATININE 1.34*  --  1.24  --   --  1.22   < > = values in this interval not displayed.    Estimated Creatinine Clearance: 51.3 mL/min (by C-G formula based on SCr of 1.22 mg/dL).  Medical History: Past Medical History:  Diagnosis Date   Basal cell carcinoma 08/11/2022   R forearm - ED&C   Depression    Foot ulcer (HCC) 12/12/2016   Hyperlipidemia    Hypertension    PAD (peripheral artery disease) (HCC) ~2007   s/p R BKA for non-healing wound   Stroke (HCC) 03/2014   MRI: Acute nonhemorrhagic left paracentral pontine infarct. 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   TIA (transient ischemic attack) 01/2014    Medications:  No prior anticoagulation noted   Assessment: 72 year old male with history of hypertension, hyperlipidemia, depression, TIA, R BKA who presents to the emergency department for chief concerns of left foot pain and redness with swelling. Pt with history of prior lower extremity stents placed 02/2023. Pharmacy has been consulted to initiate and manage IV heparin therapy for lower extremity aterial occlusion   Goal of Therapy:  Heparin level 0.3-0.7 units/ml Monitor platelets by anticoagulation protocol: Yes   0615 0657 HL 0.32, therapeutic x 1 0616 0420 HL 0.15, subtherapeutic x 1 0616 1408 HL 0.19,  subtherapeutic 0616 2313 HL 0.29, subtherapeutic  0617 0755 HL 0.38,  therapeutic x 1 0617 1341 HL 0.12, subtherapeutic 0617 1928 HL 0.25, subtherapeutic 0618 0437 HL 0.32, therapeutic X 1  0618 1100 HL 0.38, therapeutic x 2 0619 0433 HL 0.26, SUBtherapeutic  0619 1233 HL 0.40, therapeutic x 1 0619 1802 HL 0.36, therapeutic x 2 0620 0342 HL 0.28, SUBtherapeutic   Plan: 6/20:  HL @ 0342 = 0.28, SUBtherapeutic  - Will order heparin 1000 units IV X 1 bolus and increase drip rate to 2000 units/hr.  --Re-check HL 6 hrs after rate change  - CBC daily   Kinya Meine D 05/26/2023

## 2023-05-27 ENCOUNTER — Encounter: Payer: Self-pay | Admitting: Vascular Surgery

## 2023-05-27 DIAGNOSIS — L97509 Non-pressure chronic ulcer of other part of unspecified foot with unspecified severity: Secondary | ICD-10-CM | POA: Diagnosis not present

## 2023-05-27 DIAGNOSIS — I7025 Atherosclerosis of native arteries of other extremities with ulceration: Secondary | ICD-10-CM | POA: Diagnosis not present

## 2023-05-27 LAB — MAGNESIUM: Magnesium: 1.9 mg/dL (ref 1.7–2.4)

## 2023-05-27 LAB — TYPE AND SCREEN

## 2023-05-27 LAB — GLUCOSE, CAPILLARY
Glucose-Capillary: 137 mg/dL — ABNORMAL HIGH (ref 70–99)
Glucose-Capillary: 148 mg/dL — ABNORMAL HIGH (ref 70–99)
Glucose-Capillary: 121 mg/dL — ABNORMAL HIGH (ref 70–99)
Glucose-Capillary: 159 mg/dL — ABNORMAL HIGH (ref 70–99)

## 2023-05-27 LAB — BASIC METABOLIC PANEL
Anion gap: 5 (ref 5–15)
BUN: 18 mg/dL (ref 8–23)
CO2: 24 mmol/L (ref 22–32)
Calcium: 7.7 mg/dL — ABNORMAL LOW (ref 8.9–10.3)
Chloride: 106 mmol/L (ref 98–111)
Creatinine, Ser: 1.37 mg/dL — ABNORMAL HIGH (ref 0.61–1.24)
GFR, Estimated: 55 mL/min — ABNORMAL LOW (ref >60)
Glucose, Bld: 208 mg/dL — ABNORMAL HIGH (ref 70–99)
Potassium: 3.4 mmol/L — ABNORMAL LOW (ref 3.5–5.1)
Sodium: 135 mmol/L (ref 135–145)

## 2023-05-27 LAB — CBC
HCT: 22.9 % — ABNORMAL LOW (ref 39.0–52.0)
Hemoglobin: 7.3 g/dL — ABNORMAL LOW (ref 13.0–17.0)
MCH: 29.6 pg (ref 26.0–34.0)
MCHC: 31.9 g/dL (ref 30.0–36.0)
MCV: 92.7 fL (ref 80.0–100.0)
Platelets: 198 10*3/uL (ref 150–400)
RBC: 2.47 MIL/uL — ABNORMAL LOW (ref 4.22–5.81)
RDW: 17.8 % — ABNORMAL HIGH (ref 11.5–15.5)
WBC: 16.1 10*3/uL — ABNORMAL HIGH (ref 4.0–10.5)
nRBC: 0 % (ref 0.0–0.2)

## 2023-05-27 LAB — PREPARE RBC (CROSSMATCH)

## 2023-05-27 LAB — BPAM RBC

## 2023-05-27 MED ORDER — POTASSIUM CHLORIDE CRYS ER 20 MEQ PO TBCR
40.0000 meq | EXTENDED_RELEASE_TABLET | Freq: Once | ORAL | Status: AC
  Administered 2023-05-27: 40 meq via ORAL
  Filled 2023-05-27: qty 2

## 2023-05-27 MED ORDER — SODIUM CHLORIDE 0.9% IV SOLUTION: Freq: Once | INTRAVENOUS | Status: AC

## 2023-05-27 NOTE — Care Management Important Message (Signed)
Important Message  Patient Details  Name: Jordan Arellano MRN: [id number] Date of Birth: 03-22-1951   Medicare Important Message Given:  Yes     Olegario Messier A Anquan Azzarello 05/27/2023, 12:34 PM

## 2023-05-27 NOTE — Progress Notes (Signed)
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 pmh of CVA, HTN, tobacco use, PAD, hypergliceridemia, left renal stenosis, DLD, depression and heart failure.    PT Comments    Patient supine in bed upon arrival, agreeable to PT session this date. Patient continues to demo independence with bed mobility and ability to donn prosthesis independent at EOB. Min A for sit to stand and ambulation with use of RW. Ambulated 24 ft total (12 ft each trial with rest break between due to fatigue). Able to maintain PWB in LLE with mobility tasks. Patient requesting to return to bed, returned with bed alarm set and all needs met.    Recommendations for follow up therapy are one component of a multi-disciplinary discharge planning process, led by the attending physician.  Recommendations may be updated based on patient status, additional functional criteria and insurance authorization.  Follow Up Recommendations       Assistance Recommended at Discharge Intermittent Supervision/Assistance  Patient can return home with the following A little help with walking and/or transfers;Assistance with cooking/housework;Assist for transportation;Help with stairs or ramp for entrance   Equipment Recommendations  Rolling walker (2 wheels)    Recommendations for Other Services       Precautions / Restrictions Precautions Precautions: Fall Restrictions Weight Bearing Restrictions: Yes LLE Weight Bearing: Partial weight bearing     Mobility  Bed Mobility Overal bed mobility: Needs Assistance Bed Mobility: Supine to Sit, Sit to Supine     Supine to sit: Independent Sit to supine: Independent   General bed mobility comments: HOB slightly elevated but able  to sit up IND and scoot at edge of bed for positioning    Transfers Overall transfer level: Needs assistance Equipment used: Rolling walker (2 wheels) Transfers: Sit to/from Stand Sit to Stand: Min assist           General transfer comment: Cues for maintaining precautions; Completed able to perform STS from edge of bed with MIN A, cues needed to push up from chair as pt attempts to pull backon RW.    Ambulation/Gait Ambulation/Gait assistance: Min assist Gait Distance (Feet): 24 Feet (12 ft x 2 trials (with seated rest break between due to fatigue)) Assistive device: Rolling walker (2 wheels) Gait Pattern/deviations: Step-to pattern, Decreased step length - right, Decreased step length - left, Narrow base of support, Trunk flexed, Decreased weight shift to left Gait velocity: Decreased     General Gait Details: Patient able to ambulate with RW and forefoot offloading shoe. Min assist needed for balance with WBing status.   Stairs             Wheelchair Mobility    Modified Rankin (Stroke Patients Only)       Balance Overall balance assessment: Needs assistance Sitting-balance support: Feet supported, No upper extremity supported Sitting balance-Leahy Scale: Normal Sitting balance - Comments: Sitting EOB to don prosthesis.   Standing balance support: Bilateral upper extremity supported, During functional activity, Reliant on assistive device for balance Standing balance-Leahy Scale: Fair Standing balance comment: Reliant on RW for balance; Decreased balance initially upon standing with some improvements with time.  Cognition Arousal/Alertness: Awake/alert Behavior During Therapy: WFL for tasks assessed/performed Overall Cognitive Status: Within Functional Limits for tasks assessed                                          Exercises Other Exercises Other Exercises: mass sit to stand practice working on  technique, safety, and maintaining precautions with transfers.    General Comments        Pertinent Vitals/Pain Pain Assessment Pain Assessment: No/denies pain    Home Living                          Prior Function            PT Goals (current goals can now be found in the care plan section) Acute Rehab PT Goals Patient Stated Goal: to go home PT Goal Formulation: With patient Time For Goal Achievement: 06/08/23 Potential to Achieve Goals: Good Progress towards PT goals: Progressing toward goals    Frequency    Min 3X/week      PT Plan Current plan remains appropriate    Co-evaluation              AM-PAC PT "6 Clicks" Mobility   Outcome Measure  Help needed turning from your back to your side while in a flat bed without using bedrails?: None Help needed moving from lying on your back to sitting on the side of a flat bed without using bedrails?: None Help needed moving to and from a bed to a chair (including a wheelchair)?: A Little Help needed standing up from a chair using your arms (e.g., wheelchair or bedside chair)?: A Little Help needed to walk in hospital room?: A Little Help needed climbing 3-5 steps with a railing? : A Lot 6 Click Score: 19    End of Session Equipment Utilized During Treatment: Gait belt Activity Tolerance: Patient tolerated treatment well Patient left: in bed;with bed alarm set Nurse Communication: Mobility status PT Visit Diagnosis: Unsteadiness on feet (R26.81);Other abnormalities of gait and mobility (R26.89);Difficulty in walking, not elsewhere classified (R26.2);History of falling (Z91.81)     Time: 6962-9528 PT Time Calculation (min) (ACUTE ONLY): 22 min  Charges:  $Gait Training: 8-22 mins                     Creed Copper Fairly, PT, DPT 05/27/23 10:01 AM

## 2023-05-27 NOTE — Plan of Care (Signed)
  Problem: Education: Goal: Ability to describe self-care measures that may prevent or decrease complications (Diabetes Survival Skills Education) will improve 05/27/2023 1237 by Latanya Maudlin, RN Outcome: Progressing 05/27/2023 1237 by Latanya Maudlin, RN Outcome: Progressing   Problem: Coping: Goal: Ability to adjust to condition or change in health will improve 05/27/2023 1237 by Latanya Maudlin, RN Outcome: Progressing 05/27/2023 1237 by Latanya Maudlin, RN Outcome: Progressing   Problem: Fluid Volume: Goal: Ability to maintain a balanced intake and output will improve 05/27/2023 1237 by Latanya Maudlin, RN Outcome: Progressing 05/27/2023 1237 by Latanya Maudlin, RN Outcome: Progressing   Problem: Health Behavior/Discharge Planning: Goal: Ability to identify and utilize available resources and services will improve 05/27/2023 1237 by Latanya Maudlin, RN Outcome: Progressing 05/27/2023 1237 by Latanya Maudlin, RN Outcome: Progressing Goal: Ability to manage health-related needs will improve 05/27/2023 1237 by Latanya Maudlin, RN Outcome: Progressing 05/27/2023 1237 by Latanya Maudlin, RN Outcome: Progressing   Problem: Metabolic: Goal: Ability to maintain appropriate glucose levels will improve 05/27/2023 1237 by Latanya Maudlin, RN Outcome: Progressing 05/27/2023 1237 by Latanya Maudlin, RN Outcome: Progressing   Problem: Nutritional: Goal: Maintenance of adequate nutrition will improve 05/27/2023 1237 by Latanya Maudlin, RN Outcome: Progressing 05/27/2023 1237 by Latanya Maudlin, RN Outcome: Progressing Goal: Progress toward achieving an optimal weight will improve 05/27/2023 1237 by Latanya Maudlin, RN Outcome: Progressing 05/27/2023 1237 by Latanya Maudlin, RN Outcome: Progressing   Problem: Skin Integrity: Goal: Risk for impaired skin integrity will decrease 05/27/2023 1237 by Latanya Maudlin, RN Outcome: Progressing 05/27/2023 1237 by Latanya Maudlin, RN Outcome: Progressing   Problem: Tissue Perfusion: Goal: Adequacy of tissue perfusion will improve 05/27/2023 1237 by Latanya Maudlin, RN Outcome: Progressing 05/27/2023 1237 by Latanya Maudlin, RN Outcome: Progressing   Problem: Education: Goal: Knowledge of General Education information will improve Description: Including pain rating scale, medication(s)/side effects and non-pharmacologic comfort measures Outcome: Progressing   Problem: Health Behavior/Discharge Planning: Goal: Ability to manage health-related needs will improve Outcome: Progressing   Problem: Clinical Measurements: Goal: Ability to maintain clinical measurements within normal limits will improve Outcome: Progressing Goal: Will remain free from infection Outcome: Progressing Goal: Diagnostic test results will improve Outcome: Progressing Goal: Respiratory complications will improve Outcome: Progressing Goal: Cardiovascular complication will be avoided Outcome: Progressing   Problem: Activity: Goal: Risk for activity intolerance will decrease Outcome: Progressing   Problem: Nutrition: Goal: Adequate nutrition will be maintained Outcome: Progressing   Problem: Coping: Goal: Level of anxiety will decrease Outcome: Progressing   Problem: Elimination: Goal: Will not experience complications related to bowel motility Outcome: Progressing Goal: Will not experience complications related to urinary retention Outcome: Progressing   Problem: Pain Managment: Goal: General experience of comfort will improve Outcome: Progressing   Problem: Safety: Goal: Ability to remain free from injury will improve Outcome: Progressing   Problem: Skin Integrity: Goal: Risk for impaired skin integrity will decrease Outcome: Progressing

## 2023-05-27 NOTE — Progress Notes (Addendum)
Progress Note    Jordan Arellano  QIH:[id number] DOB: January 27, 1951  DOA: 05/18/2023 PCP: Mort Sawyers, FNP      Brief Narrative:    Medical records reviewed and are as summarized below:  Jordan Arellano is a 72 y.o. male hypertension, hyperlipidemia, depression, TIA, stroke, PAD, presented to the hospital with left foot pain associated with redness and swelling.  He was found to have osteomyelitis of the left first and third toes.  He was treated with empiric IV antibiotics.  He underwent stent placement to right common iliac artery and right external iliac artery on 05/20/2023.  He also underwent amputation of left great toe and amputation of left third toe.  He developed persistent hypotension which did not respond to IV fluids.  This necessitated transfer to the ICU for vasopressors for septic shock.        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) Active Problems:   Essential hypertension   Dyslipidemia   Depression   History of CVA (cerebrovascular accident) without residual deficits   Tobacco abuse   PAD s/p right BKA, history revascularization left leg   Hypertriglyceridemia   Status post femoral-popliteal bypass surgery   Left renal artery stenosis (HCC)   Recurrent major depressive disorder, in full remission (HCC)   Heart failure with preserved ejection fraction (HCC)   Encounter for tobacco use cessation counseling   At risk for constipation   Acute hematogenous osteomyelitis of left foot (HCC)   Protein-calorie malnutrition, severe   Nutrition Problem: Severe Malnutrition Etiology: social / environmental circumstances  Signs/Symptoms: mild fat depletion, moderate fat depletion, moderate muscle depletion, severe muscle depletion, percent weight loss Percent weight loss: 8.4 %   Body mass index is 18.73 kg/m.   Septic shock secondary to acute osteomyelitis of left first and third  toes,? septic arthritis left great toe IP joint: Sepsis physiology has resolved. Left foot culture wound showed Staphylococcus lugdunensis.  No growth on blood culture.  Continue doxycycline through 06/01/2023. Pathology report: Left great toe and third toe with soft tissue necrosis and acute osteomyelitis. Viable soft tissue and articular surface of bone at resection margins negative for acute osteomyelitis.   PAD, ischemic limb of left lower extremity: S/p stent placement to left tibioperoneal trunk and popliteal artery, stent placement to left SFA and popliteal arteries on 05/26/2023.  Follow-up with vascular surgeon.  S/p stent placement to right common iliac artery and right external iliac artery on 05/20/2023 History of stroke Continue rosuvastatin, aspirin and Plavix   Anemia of chronic disease: Hemoglobin dropped from 7.5-7.3.  Of note, hemoglobin was 10.6 on admission on 05/18/2023.  Transfuse 1 units of PRBCs.  Discussed risk, benefits and alternatives to blood transfusion.  He is agreeable to blood transfusion.  Repeat CBC tomorrow.    Hypokalemia: Replete potassium and monitor levels   Type II DM: NovoLog as needed for hyperglycemia.   Other comorbidities include hypertension, hyperlipidemia   Diet Order             Diet Heart Room service appropriate? Yes; Fluid consistency: Thin  Diet effective now                            Consultants: Vascular surgeon Intensivist Podiatrist  Procedures: Amputation left great toe at the IPJ, amputation left third toe metatarsophalangeal joint on 05/21/2023 Stent placement to right common iliac artery  and right external iliac artery on 05/20/2023   Medications:    vitamin C  500 mg Oral BID   aspirin EC  81 mg Oral Daily   Chlorhexidine Gluconate Cloth  6 each Topical Daily   clopidogrel  75 mg Oral Daily   cyanocobalamin  1,000 mcg Oral Daily   doxycycline  100 mg Oral Q12H   feeding supplement  237 mL Oral TID  BM   folic acid  1 mg Oral Daily   insulin aspart  0-15 Units Subcutaneous TID WC   insulin aspart  0-5 Units Subcutaneous QHS   latanoprost  1 drop Both Eyes QHS   multivitamin with minerals  1 tablet Oral Daily   polyethylene glycol  17 g Oral Daily   rosuvastatin  40 mg Oral QHS   sertraline  100 mg Oral Daily   zinc sulfate  220 mg Oral Daily   Continuous Infusions:  sodium chloride Stopped (05/21/23 0815)     Anti-infectives (From admission, onward)    Start     Dose/Rate Route Frequency Ordered Stop   05/26/23 2200  doxycycline (VIBRA-TABS) tablet 100 mg        100 mg Oral Every 12 hours 05/26/23 1047 06/02/23 2159   05/26/23 1412  ceFAZolin (ANCEF) IVPB 2g/100 mL premix        2 g 200 mL/hr over 30 Minutes Intravenous 30 min pre-op 05/26/23 1412 05/26/23 1754   05/22/23 0830  vancomycin (VANCOCIN) IVPB 1000 mg/200 mL premix        1,000 mg 200 mL/hr over 60 Minutes Intravenous Every 24 hours 05/22/23 0734 05/26/23 1240   05/21/23 1032  vancomycin variable dose per unstable renal function (pharmacist dosing)  Status:  Discontinued         Does not apply See admin instructions 05/21/23 1032 05/22/23 0734   05/20/23 1400  ceFAZolin (ANCEF) IVPB 2g/100 mL premix        2 g 200 mL/hr over 30 Minutes Intravenous 30 min pre-op 05/20/23 1400 05/20/23 1552   05/19/23 2000  vancomycin (VANCOCIN) IVPB 1000 mg/200 mL premix  Status:  Discontinued        1,000 mg 200 mL/hr over 60 Minutes Intravenous Every 24 hours 05/18/23 1827 05/19/23 1034   05/19/23 2000  vancomycin (VANCOREADY) IVPB 1250 mg/250 mL  Status:  Discontinued        1,250 mg 166.7 mL/hr over 90 Minutes Intravenous Every 24 hours 05/19/23 1034 05/21/23 1032   05/18/23 1900  vancomycin (VANCOREADY) IVPB 1500 mg/300 mL        1,500 mg 150 mL/hr over 120 Minutes Intravenous  Once 05/18/23 1827 05/18/23 2120   05/18/23 1830  ceFEPIme (MAXIPIME) 2 g in sodium chloride 0.9 % 100 mL IVPB  Status:  Discontinued        2  g 200 mL/hr over 30 Minutes Intravenous Every 12 hours 05/18/23 1827 05/24/23 1122   05/18/23 1400  cefTRIAXone (ROCEPHIN) 2 g in sodium chloride 0.9 % 100 mL IVPB        2 g 200 mL/hr over 30 Minutes Intravenous  Once 05/18/23 1347 05/18/23 1438              Family Communication/Anticipated D/C date and plan/Code Status   DVT prophylaxis: Place TED hose Start: 05/18/23 1434     Code Status: Full Code  Family Communication: None Disposition Plan: Plan to discharge home when cleared by vascular surgeon/podiatrist   Status is: Inpatient Remains inpatient appropriate because:  Plan for lower extremity angiogram       Subjective:   Interval events noted.  He feels weak.  No shortness of breath or chest pain.  Objective:    Vitals:   05/27/23 1132 05/27/23 1145 05/27/23 1445 05/27/23 1623  BP: (!) 112/54 136/66 115/61 (!) 117/58  Pulse: 75 91 77 82  Resp:  18 16 19   Temp: 99.9 F (37.7 C) 99.9 F (37.7 C) 99.9 F (37.7 C) 99.9 F (37.7 C)  TempSrc: Oral Oral Axillary   SpO2: 99% 100% 96% 98%  Weight:      Height:       No data found.   Intake/Output Summary (Last 24 hours) at 05/27/2023 1650 Last data filed at 05/27/2023 1624 Gross per 24 hour  Intake 876.99 ml  Output 375 ml  Net 501.99 ml   Filed Weights   05/19/23 2354 05/20/23 1444 05/26/23 1521  Weight: 64.4 kg 64.4 kg 64.4 kg    Exam:  GEN: NAD SKIN: Warm and dry EYES: No pallor or icterus ENT: MMM CV: RRR PULM: CTA B ABD: soft, ND, NT, +BS CNS: AAO x 3, non focal EXT: Right BKA.  Dressing on left foot surgical wound is clean, dry and intact.     Data Reviewed:   I have personally reviewed following labs and imaging studies:  Labs: Labs show the following:   Basic Metabolic Panel: Recent Labs  Lab 05/21/23 0524 05/22/23 0420 05/23/23 0529 05/24/23 0437 05/25/23 0433 05/26/23 0342 05/27/23 0417  NA 138   < > 136 133* 136 138 135  K 3.5   < > 3.9 3.4* 3.8 3.5 3.4*   CL 111   < > 109 105 109 108 106  CO2 17*   < > 20* 20* 20* 22 24  GLUCOSE 125*   < > 120* 180* 126* 133* 208*  BUN 27*   < > 20 19 17 18 18   CREATININE 1.53*   < > 1.29* 1.34* 1.24 1.22 1.37*  CALCIUM 8.7*   < > 8.5* 8.2* 8.0* 8.4* 7.7*  MG 1.3*  --  1.6* 2.1 1.8  --   --    < > = values in this interval not displayed.   GFR Estimated Creatinine Clearance: 45.7 mL/min (A) (by C-G formula based on SCr of 1.37 mg/dL (H)). Liver Function Tests: No results for input(s): "AST", "ALT", "ALKPHOS", "BILITOT", "PROT", "ALBUMIN" in the last 168 hours.  No results for input(s): "LIPASE", "AMYLASE" in the last 168 hours. No results for input(s): "AMMONIA" in the last 168 hours. Coagulation profile No results for input(s): "INR", "PROTIME" in the last 168 hours.   CBC: Recent Labs  Lab 05/20/23 2258 05/21/23 0524 05/23/23 0529 05/24/23 0437 05/25/23 0433 05/26/23 0342 05/27/23 0417  WBC 8.9   < > 10.2 8.2 9.9 10.9* 16.1*  NEUTROABS 8.4*  --   --   --   --  6.2  --   HGB 10.1*   < > 7.9* 7.9* 7.3* 7.5* 7.3*  HCT 31.7*   < > 24.4* 23.7* 22.3* 23.0* 22.9*  MCV 93.8   < > 91.0 90.8 91.4 92.4 92.7  PLT 218   < > 179 169 161 185 198   < > = values in this interval not displayed.   Cardiac Enzymes: No results for input(s): "CKTOTAL", "CKMB", "CKMBINDEX", "TROPONINI" in the last 168 hours. BNP (last 3 results) No results for input(s): "PROBNP" in the last 8760 hours. CBG: Recent Labs  Lab 05/26/23 1525 05/26/23 1834 05/26/23 2125 05/27/23 0856 05/27/23 1233  GLUCAP 125* 127* 166* 137* 148*   D-Dimer: No results for input(s): "DDIMER" in the last 72 hours. Hgb A1c: No results for input(s): "HGBA1C" in the last 72 hours. Lipid Profile: No results for input(s): "CHOL", "HDL", "LDLCALC", "TRIG", "CHOLHDL", "LDLDIRECT" in the last 72 hours. Thyroid function studies: No results for input(s): "TSH", "T4TOTAL", "T3FREE", "THYROIDAB" in the last 72 hours.  Invalid input(s):  "FREET3" Anemia work up: No results for input(s): "VITAMINB12", "FOLATE", "FERRITIN", "TIBC", "IRON", "RETICCTPCT" in the last 72 hours. Sepsis Labs: Recent Labs  Lab 05/20/23 2152 05/20/23 2258 05/20/23 2353 05/21/23 0524 05/24/23 0437 05/25/23 0433 05/26/23 0342 05/27/23 0417  WBC  --    < >  --    < > 8.2 9.9 10.9* 16.1*  LATICACIDVEN 2.2*  --  1.6  --   --   --   --   --    < > = values in this interval not displayed.    Microbiology Recent Results (from the past 240 hour(s))  Culture, blood (Routine X 2) w Reflex to ID Panel     Status: None   Collection Time: 05/20/23 10:58 PM   Specimen: BLOOD  Result Value Ref Range Status   Specimen Description   Final    BLOOD BLOOD RIGHT ARM Performed at [facility], [address], [place], [place] [address]    Special Requests   Final    BOTTLES DRAWN AEROBIC AND ANAEROBIC Blood Culture adequate volume Performed at [facility], [address], [place], [place] [address]    Culture   Final    NO GROWTH 5 DAYS Performed at [facility], [address] [place], [place] [address]    Report Status 05/25/2023 FINAL  Final  Culture, blood (Routine X 2) w Reflex to ID Panel     Status: None   Collection Time: 05/20/23 11:53 PM   Specimen: BLOOD  Result Value Ref Range Status   Specimen Description BLOOD BLOOD LEFT ARM  Final   Special Requests   Final    BOTTLES DRAWN AEROBIC AND ANAEROBIC Blood Culture adequate volume   Culture   Final    NO GROWTH 5 DAYS Performed at [facility], [address], [place], [place] [address]    Report Status 05/26/2023 FINAL  Final  Aerobic/Anaerobic Culture w Gram Stain (surgical/deep wound)     Status: None   Collection Time: 05/21/23  9:13 AM   Specimen: Bone; Tissue  Result Value Ref Range Status   Specimen Description   Final    BONE Performed at [facility], [address] [place], [place] [address]    Special Requests   Final     NONE Performed at [facility], [address] [place], [place] [address]    Gram Stain   Final    MODERATE WBC PRESENT, PREDOMINANTLY PMN RARE GRAM POSITIVE COCCI RARE GRAM POSITIVE RODS CRITICAL RESULT CALLED TO, READ BACK BY AND VERIFIED WITH: RN Georgian Co [id number] AT 1457 BY EC    Culture   Final    FEW STAPHYLOCOCCUS LUGDUNENSIS RARE DIPHTHEROIDS(CORYNEBACTERIUM SPECIES) Standardized susceptibility testing for this organism is not available. NO ANAEROBES ISOLATED Performed at [facility], [address] [place], [place] [address]    Report Status 05/26/2023 FINAL  Final   Organism ID, Bacteria STAPHYLOCOCCUS LUGDUNENSIS  Final      Susceptibility  Staphylococcus lugdunensis - MIC*    CIPROFLOXACIN <=0.5 SENSITIVE Sensitive     ERYTHROMYCIN <=0.25 SENSITIVE Sensitive     GENTAMICIN <=0.5 SENSITIVE Sensitive     OXACILLIN >=4 RESISTANT Resistant     TETRACYCLINE <=1 SENSITIVE Sensitive     VANCOMYCIN <=0.5 SENSITIVE Sensitive     TRIMETH/SULFA <=10 SENSITIVE Sensitive     CLINDAMYCIN <=0.25 SENSITIVE Sensitive     RIFAMPIN <=0.5 SENSITIVE Sensitive     Inducible Clindamycin NEGATIVE Sensitive     * FEW STAPHYLOCOCCUS LUGDUNENSIS    Procedures and diagnostic studies:  PERIPHERAL VASCULAR CATHETERIZATION  Result Date: 05/27/2023 See surgical note for result.              LOS: 9 days   Talayeh Bruinsma  [employer] Chartered loss adjuster on [email or web address]. If 7PM-7AM, please contact night-coverage at [email or web address]     05/27/2023, 4:50 PM

## 2023-05-28 DIAGNOSIS — N179 Acute kidney failure, unspecified: Secondary | ICD-10-CM

## 2023-05-28 DIAGNOSIS — I7025 Atherosclerosis of native arteries of other extremities with ulceration: Secondary | ICD-10-CM | POA: Diagnosis not present

## 2023-05-28 DIAGNOSIS — L97509 Non-pressure chronic ulcer of other part of unspecified foot with unspecified severity: Secondary | ICD-10-CM | POA: Diagnosis not present

## 2023-05-28 LAB — GLUCOSE, CAPILLARY
Glucose-Capillary: 160 mg/dL — ABNORMAL HIGH (ref 70–99)
Glucose-Capillary: 171 mg/dL — ABNORMAL HIGH (ref 70–99)
Glucose-Capillary: 155 mg/dL — ABNORMAL HIGH (ref 70–99)
Glucose-Capillary: 137 mg/dL — ABNORMAL HIGH (ref 70–99)

## 2023-05-28 LAB — CBC WITH DIFFERENTIAL/PLATELET
Abs Immature Granulocytes: 0.21 10*3/uL — ABNORMAL HIGH (ref 0.00–0.07)
Basophils Absolute: 0.1 10*3/uL (ref 0.0–0.1)
Basophils Relative: 0 %
Eosinophils Absolute: 0.8 10*3/uL — ABNORMAL HIGH (ref 0.0–0.5)
Eosinophils Relative: 6 %
HCT: 27.3 % — ABNORMAL LOW (ref 39.0–52.0)
Hemoglobin: 8.7 g/dL — ABNORMAL LOW (ref 13.0–17.0)
Immature Granulocytes: 2 %
Lymphocytes Relative: 13 %
Lymphs Abs: 1.8 10*3/uL (ref 0.7–4.0)
MCH: 29.4 pg (ref 26.0–34.0)
MCHC: 31.9 g/dL (ref 30.0–36.0)
MCV: 92.2 fL (ref 80.0–100.0)
Monocytes Absolute: 0.8 10*3/uL (ref 0.1–1.0)
Monocytes Relative: 5 %
Neutro Abs: 10.6 10*3/uL — ABNORMAL HIGH (ref 1.7–7.7)
Neutrophils Relative %: 74 %
Platelets: 241 10*3/uL (ref 150–400)
RBC: 2.96 MIL/uL — ABNORMAL LOW (ref 4.22–5.81)
RDW: 18 % — ABNORMAL HIGH (ref 11.5–15.5)
WBC: 14.2 10*3/uL — ABNORMAL HIGH (ref 4.0–10.5)
nRBC: 0.1 % (ref 0.0–0.2)

## 2023-05-28 LAB — TYPE AND SCREEN
ABO/RH(D): A POS
Antibody Screen: NEGATIVE
Unit division: 0

## 2023-05-28 LAB — BPAM RBC
Blood Product Expiration Date: 202407162359
ISSUE DATE / TIME: 202406211124
Unit Type and Rh: 6200

## 2023-05-28 LAB — BASIC METABOLIC PANEL
Anion gap: 8 (ref 5–15)
BUN: 24 mg/dL — ABNORMAL HIGH (ref 8–23)
CO2: 21 mmol/L — ABNORMAL LOW (ref 22–32)
Calcium: 8.2 mg/dL — ABNORMAL LOW (ref 8.9–10.3)
Chloride: 109 mmol/L (ref 98–111)
Creatinine, Ser: 1.65 mg/dL — ABNORMAL HIGH (ref 0.61–1.24)
GFR, Estimated: 44 mL/min — ABNORMAL LOW (ref >60)
Glucose, Bld: 153 mg/dL — ABNORMAL HIGH (ref 70–99)
Potassium: 4.6 mmol/L (ref 3.5–5.1)
Sodium: 138 mmol/L (ref 135–145)

## 2023-05-28 MED ORDER — LACTATED RINGERS IV SOLN: INTRAVENOUS | Status: AC

## 2023-05-28 NOTE — TOC Progression Note (Signed)
Transition of Care [facility]) - Progression Note    Patient Details  Name: Jordan Arellano MRN: [id number] Date of Birth: 11/11/1951  Transition of Care [facility]) CM/SW Contact  Garret Reddish, RN Phone Number: 05/28/2023, 2:01 PM  Clinical Narrative:   Chart reviewed. Noted that patient had  AKI.  LR started and will monitor BMP.  Noted that patient had recent exposure to contrast from lower extremity angiogram.  I have spoken with Becky Sax with [facility].. She informs me that patient is active with [facility] services.  Elnita Maxwell informs me that the services the patient was receiving was [facility] PT, OT and RN.    TOC will continue to follow for discharge planning.        Expected Discharge Plan: Home w Home Health Services (Home with Home Health v/s SNF) Barriers to Discharge: No Barriers Identified  Expected Discharge Plan and Services   Discharge Planning Services: CM Consult Post Acute Care Choice: Home Health Living arrangements for the past 2 months: Single Family Home                                       Social Determinants of Health (SDOH) Interventions SDOH Screenings   Food Insecurity: No Food Insecurity (05/19/2023)  Housing: Low Risk  (05/19/2023)  Transportation Needs: No Transportation Needs (05/19/2023)  Utilities: Not At Risk (05/19/2023)  Alcohol Screen: Low Risk  (04/07/2022)  Depression (PHQ2-9): Low Risk  (04/07/2022)  Financial Resource Strain: Low Risk  (10/08/2021)  Physical Activity: Insufficiently Active (04/07/2022)  Social Connections: Socially Isolated (04/07/2022)  Stress: No Stress Concern Present (04/07/2022)  Tobacco Use: High Risk (05/27/2023)    Readmission Risk Interventions     No data to display

## 2023-05-28 NOTE — Progress Notes (Signed)
OT Cancellation Note  Patient Details Name: Aasim Restivo MRN: [id number] DOB: 30-Dec-1950   Cancelled Treatment:    Reason Eval/Treat Not Completed: Other (comment) Pt report his L thigh/groin really hurting today - this afternoon more - 8/10 would prefer if can check back tomorrow.  Oletta Cohn OTR/L,CLT 05/28/2023, 2:50 PM

## 2023-05-28 NOTE — Plan of Care (Signed)
  Problem: Education: Goal: Knowledge of General Education information will improve Description Including pain rating scale, medication(s)/side effects and non-pharmacologic comfort measures Outcome: Progressing   Problem: Health Behavior/Discharge Planning: Goal: Ability to manage health-related needs will improve Outcome: Progressing   Problem: Clinical Measurements: Goal: Ability to maintain clinical measurements within normal limits will improve Outcome: Progressing Goal: Diagnostic test results will improve Outcome: Progressing   Problem: Activity: Goal: Risk for activity intolerance will decrease Outcome: Progressing   Problem: Nutrition: Goal: Adequate nutrition will be maintained Outcome: Progressing   Problem: Pain Managment: Goal: General experience of comfort will improve Outcome: Progressing   

## 2023-05-28 NOTE — Progress Notes (Addendum)
Progress Note    Jordan Arellano  QMV:[id number] DOB: Jan 16, 1951  DOA: 05/18/2023 PCP: Mort Sawyers, FNP      Brief Narrative:    Medical records reviewed and are as summarized below:  Jordan Arellano is a 72 y.o. male hypertension, hyperlipidemia, depression, TIA, stroke, PAD, presented to the hospital with left foot pain associated with redness and swelling.  He was found to have osteomyelitis of the left first and third toes.  He was treated with empiric IV antibiotics.  He underwent stent placement to right common iliac artery and right external iliac artery on 05/20/2023.  He also underwent amputation of left great toe and amputation of left third toe.  He developed persistent hypotension which did not respond to IV fluids.  This necessitated transfer to the ICU for vasopressors for septic shock.        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) Active Problems:   AKI (acute kidney injury) (HCC)   Essential hypertension   Dyslipidemia   Depression   History of CVA (cerebrovascular accident) without residual deficits   Tobacco abuse   PAD s/p right BKA, history revascularization left leg   Hypertriglyceridemia   Status post femoral-popliteal bypass surgery   Left renal artery stenosis (HCC)   Recurrent major depressive disorder, in full remission (HCC)   Heart failure with preserved ejection fraction (HCC)   Encounter for tobacco use cessation counseling   At risk for constipation   Acute hematogenous osteomyelitis of left foot (HCC)   Protein-calorie malnutrition, severe   Nutrition Problem: Severe Malnutrition Etiology: social / environmental circumstances  Signs/Symptoms: mild fat depletion, moderate fat depletion, moderate muscle depletion, severe muscle depletion, percent weight loss Percent weight loss: 8.4 %   Body mass index is 18.73 kg/m.   Septic shock secondary to acute  osteomyelitis of left first and third toes,? septic arthritis left great toe IP joint: Sepsis physiology has resolved. Left foot culture wound showed Staphylococcus lugdunensis.  No growth on blood culture.  Continue doxycycline through 06/01/2023. Pathology report: Left great toe and third toe with soft tissue necrosis and acute osteomyelitis. Viable soft tissue and articular surface of bone at resection margins negative for acute osteomyelitis.   PAD, ischemic limb of left lower extremity: S/p stent placement to left tibioperoneal trunk and popliteal artery, stent placement to left SFA and popliteal arteries on 05/26/2023.  Follow-up with vascular surgeon.  S/p stent placement to right common iliac artery and right external iliac artery on 05/20/2023 History of stroke Continue rosuvastatin, aspirin and Plavix   AKI: Creatinine up from 1.22 -1.37 -1.65.  Recent exposure to contrast from lower extremity angiogram on 05/26/2023.  Start IV Ringer's lactate infusion.  Monitor BMP.   Anemia of chronic disease: Hemoglobin is up from 7.3-8.7.  S/p transfusion of 1 unit of PRBCs on 05/27/2023.   Hypokalemia: Improved   Type II DM: NovoLog as needed for hyperglycemia.   Other comorbidities include hypertension, hyperlipidemia   Diet Order             Diet Heart Room service appropriate? Yes; Fluid consistency: Thin  Diet effective now                            Consultants: Vascular surgeon Intensivist Podiatrist  Procedures: Amputation left great toe at the IPJ, amputation left third toe metatarsophalangeal joint on 05/21/2023 Stent  placement to right common iliac artery and right external iliac artery on 05/20/2023   Medications:    vitamin C  500 mg Oral BID   aspirin EC  81 mg Oral Daily   Chlorhexidine Gluconate Cloth  6 each Topical Daily   clopidogrel  75 mg Oral Daily   cyanocobalamin  1,000 mcg Oral Daily   doxycycline  100 mg Oral Q12H   feeding supplement   237 mL Oral TID BM   folic acid  1 mg Oral Daily   insulin aspart  0-15 Units Subcutaneous TID WC   insulin aspart  0-5 Units Subcutaneous QHS   latanoprost  1 drop Both Eyes QHS   multivitamin with minerals  1 tablet Oral Daily   polyethylene glycol  17 g Oral Daily   rosuvastatin  40 mg Oral QHS   sertraline  100 mg Oral Daily   zinc sulfate  220 mg Oral Daily   Continuous Infusions:  sodium chloride Stopped (05/21/23 0815)   lactated ringers 75 mL/hr at 05/28/23 0814     Anti-infectives (From admission, onward)    Start     Dose/Rate Route Frequency Ordered Stop   05/26/23 2200  doxycycline (VIBRA-TABS) tablet 100 mg        100 mg Oral Every 12 hours 05/26/23 1047 06/02/23 2159   05/26/23 1412  ceFAZolin (ANCEF) IVPB 2g/100 mL premix        2 g 200 mL/hr over 30 Minutes Intravenous 30 min pre-op 05/26/23 1412 05/26/23 1754   05/22/23 0830  vancomycin (VANCOCIN) IVPB 1000 mg/200 mL premix        1,000 mg 200 mL/hr over 60 Minutes Intravenous Every 24 hours 05/22/23 0734 05/26/23 1240   05/21/23 1032  vancomycin variable dose per unstable renal function (pharmacist dosing)  Status:  Discontinued         Does not apply See admin instructions 05/21/23 1032 05/22/23 0734   05/20/23 1400  ceFAZolin (ANCEF) IVPB 2g/100 mL premix        2 g 200 mL/hr over 30 Minutes Intravenous 30 min pre-op 05/20/23 1400 05/20/23 1552   05/19/23 2000  vancomycin (VANCOCIN) IVPB 1000 mg/200 mL premix  Status:  Discontinued        1,000 mg 200 mL/hr over 60 Minutes Intravenous Every 24 hours 05/18/23 1827 05/19/23 1034   05/19/23 2000  vancomycin (VANCOREADY) IVPB 1250 mg/250 mL  Status:  Discontinued        1,250 mg 166.7 mL/hr over 90 Minutes Intravenous Every 24 hours 05/19/23 1034 05/21/23 1032   05/18/23 1900  vancomycin (VANCOREADY) IVPB 1500 mg/300 mL        1,500 mg 150 mL/hr over 120 Minutes Intravenous  Once 05/18/23 1827 05/18/23 2120   05/18/23 1830  ceFEPIme (MAXIPIME) 2 g in sodium  chloride 0.9 % 100 mL IVPB  Status:  Discontinued        2 g 200 mL/hr over 30 Minutes Intravenous Every 12 hours 05/18/23 1827 05/24/23 1122   05/18/23 1400  cefTRIAXone (ROCEPHIN) 2 g in sodium chloride 0.9 % 100 mL IVPB        2 g 200 mL/hr over 30 Minutes Intravenous  Once 05/18/23 1347 05/18/23 1438              Family Communication/Anticipated D/C date and plan/Code Status   DVT prophylaxis: Place TED hose Start: 05/18/23 1434     Code Status: Full Code  Family Communication: None Disposition Plan: Plan to discharge  home in 1 to 2 days.   Status is: Inpatient Remains inpatient appropriate because: AKI       Subjective:   Interval events noted.  He has no complaints.  He has adequate urine output.  No pain in the left foot.  Objective:    Vitals:   05/27/23 1445 05/27/23 1623 05/27/23 2313 05/28/23 0802  BP: 115/61 (!) 117/58 (!) 110/56 120/63  Pulse: 77 82 73 82  Resp: 16 19 18 17   Temp: 99.9 F (37.7 C) 99.9 F (37.7 C) 98.6 F (37 C) 99.5 F (37.5 C)  TempSrc: Axillary     SpO2: 96% 98% 97% 97%  Weight:      Height:       No data found.   Intake/Output Summary (Last 24 hours) at 05/28/2023 1149 Last data filed at 05/28/2023 0855 Gross per 24 hour  Intake 675.5 ml  Output 975 ml  Net -299.5 ml   Filed Weights   05/19/23 2354 05/20/23 1444 05/26/23 1521  Weight: 64.4 kg 64.4 kg 64.4 kg    Exam:  GEN: NAD SKIN: Warm and dry EYES: No pallor or icterus ENT: MMM CV: RRR PULM: CTA B ABD: soft, ND, NT, +BS CNS: AAO x 3, non focal EXT: Right BKA.  Dressing on left foot surgical wound is clean, dry and intact    Data Reviewed:   I have personally reviewed following labs and imaging studies:  Labs: Labs show the following:   Basic Metabolic Panel: Recent Labs  Lab 05/23/23 0529 05/24/23 0437 05/25/23 0433 05/26/23 0342 05/27/23 0417 05/28/23 0427  NA 136 133* 136 138 135 138  K 3.9 3.4* 3.8 3.5 3.4* 4.6  CL 109 105  109 108 106 109  CO2 20* 20* 20* 22 24 21*  GLUCOSE 120* 180* 126* 133* 208* 153*  BUN 20 19 17 18 18  24*  CREATININE 1.29* 1.34* 1.24 1.22 1.37* 1.65*  CALCIUM 8.5* 8.2* 8.0* 8.4* 7.7* 8.2*  MG 1.6* 2.1 1.8  --  1.9  --    GFR Estimated Creatinine Clearance: 37.9 mL/min (A) (by C-G formula based on SCr of 1.65 mg/dL (H)). Liver Function Tests: No results for input(s): "AST", "ALT", "ALKPHOS", "BILITOT", "PROT", "ALBUMIN" in the last 168 hours.  No results for input(s): "LIPASE", "AMYLASE" in the last 168 hours. No results for input(s): "AMMONIA" in the last 168 hours. Coagulation profile No results for input(s): "INR", "PROTIME" in the last 168 hours.   CBC: Recent Labs  Lab 05/24/23 0437 05/25/23 0433 05/26/23 0342 05/27/23 0417 05/28/23 0427  WBC 8.2 9.9 10.9* 16.1* 14.2*  NEUTROABS  --   --  6.2  --  10.6*  HGB 7.9* 7.3* 7.5* 7.3* 8.7*  HCT 23.7* 22.3* 23.0* 22.9* 27.3*  MCV 90.8 91.4 92.4 92.7 92.2  PLT 169 161 185 198 241   Cardiac Enzymes: No results for input(s): "CKTOTAL", "CKMB", "CKMBINDEX", "TROPONINI" in the last 168 hours. BNP (last 3 results) No results for input(s): "PROBNP" in the last 8760 hours. CBG: Recent Labs  Lab 05/27/23 1233 05/27/23 1832 05/27/23 2133 05/28/23 0752 05/28/23 1127  GLUCAP 148* 121* 159* 160* 171*   D-Dimer: No results for input(s): "DDIMER" in the last 72 hours. Hgb A1c: No results for input(s): "HGBA1C" in the last 72 hours. Lipid Profile: No results for input(s): "CHOL", "HDL", "LDLCALC", "TRIG", "CHOLHDL", "LDLDIRECT" in the last 72 hours. Thyroid function studies: No results for input(s): "TSH", "T4TOTAL", "T3FREE", "THYROIDAB" in the last 72 hours.  Invalid  input(s): "FREET3" Anemia work up: No results for input(s): "VITAMINB12", "FOLATE", "FERRITIN", "TIBC", "IRON", "RETICCTPCT" in the last 72 hours. Sepsis Labs: Recent Labs  Lab 05/25/23 0433 05/26/23 0342 05/27/23 0417 05/28/23 0427  WBC 9.9 10.9*  16.1* 14.2*    Microbiology Recent Results (from the past 240 hour(s))  Culture, blood (Routine X 2) w Reflex to ID Panel     Status: None   Collection Time: 05/20/23 10:58 PM   Specimen: BLOOD  Result Value Ref Range Status   Specimen Description   Final    BLOOD BLOOD RIGHT ARM Performed at [facility], [address], [place], [place] [address]    Special Requests   Final    BOTTLES DRAWN AEROBIC AND ANAEROBIC Blood Culture adequate volume Performed at [facility], [address] [place], [place] [address]    Culture   Final    NO GROWTH 5 DAYS Performed at [facility], [address], [place], [place] [address]    Report Status 05/25/2023 FINAL  Final  Culture, blood (Routine X 2) w Reflex to ID Panel     Status: None   Collection Time: 05/20/23 11:53 PM   Specimen: BLOOD  Result Value Ref Range Status   Specimen Description BLOOD BLOOD LEFT ARM  Final   Special Requests   Final    BOTTLES DRAWN AEROBIC AND ANAEROBIC Blood Culture adequate volume   Culture   Final    NO GROWTH 5 DAYS Performed at [facility], [address], [place], [place] [address]    Report Status 05/26/2023 FINAL  Final  Aerobic/Anaerobic Culture w Gram Stain (surgical/deep wound)     Status: None   Collection Time: 05/21/23  9:13 AM   Specimen: Bone; Tissue  Result Value Ref Range Status   Specimen Description   Final    BONE Performed at [facility], [address] [place], [place] [address]    Special Requests   Final    NONE Performed at [facility], [address] [place], [place] [address]    Gram Stain   Final    MODERATE WBC PRESENT, PREDOMINANTLY PMN RARE GRAM POSITIVE COCCI RARE GRAM POSITIVE RODS CRITICAL RESULT CALLED TO, READ BACK BY AND VERIFIED WITH: RN Georgian Co [id number] AT 1457 BY EC    Culture   Final    FEW STAPHYLOCOCCUS LUGDUNENSIS RARE DIPHTHEROIDS(CORYNEBACTERIUM SPECIES) Standardized  susceptibility testing for this organism is not available. NO ANAEROBES ISOLATED Performed at [facility], [address], [place], [place] [address]    Report Status 05/26/2023 FINAL  Final   Organism ID, Bacteria STAPHYLOCOCCUS LUGDUNENSIS  Final      Susceptibility   Staphylococcus lugdunensis - MIC*    CIPROFLOXACIN <=0.5 SENSITIVE Sensitive     ERYTHROMYCIN <=0.25 SENSITIVE Sensitive     GENTAMICIN <=0.5 SENSITIVE Sensitive     OXACILLIN >=4 RESISTANT Resistant     TETRACYCLINE <=1 SENSITIVE Sensitive     VANCOMYCIN <=0.5 SENSITIVE Sensitive     TRIMETH/SULFA <=10 SENSITIVE Sensitive     CLINDAMYCIN <=0.25 SENSITIVE Sensitive     RIFAMPIN <=0.5 SENSITIVE Sensitive     Inducible Clindamycin NEGATIVE Sensitive     * FEW STAPHYLOCOCCUS LUGDUNENSIS    Procedures and diagnostic studies:  PERIPHERAL VASCULAR CATHETERIZATION  Result Date: 05/27/2023 See surgical note for result.              LOS: 10 days   Rebekah Zackery  [facility]   Pager on [email or web address]. If 7PM-7AM, please contact night-coverage at [email or web address]     05/28/2023, 11:49 AM

## 2023-05-28 NOTE — Plan of Care (Signed)
  Problem: Pain Managment: Goal: General experience of comfort will improve Outcome: Progressing   Problem: Safety: Goal: Ability to remain free from injury will improve Outcome: Progressing   Problem: Skin Integrity: Goal: Risk for impaired skin integrity will decrease Outcome: Progressing   

## 2023-05-29 DIAGNOSIS — L97509 Non-pressure chronic ulcer of other part of unspecified foot with unspecified severity: Secondary | ICD-10-CM | POA: Diagnosis not present

## 2023-05-29 DIAGNOSIS — I7025 Atherosclerosis of native arteries of other extremities with ulceration: Secondary | ICD-10-CM | POA: Diagnosis not present

## 2023-05-29 DIAGNOSIS — N179 Acute kidney failure, unspecified: Secondary | ICD-10-CM | POA: Diagnosis not present

## 2023-05-29 LAB — BASIC METABOLIC PANEL
Anion gap: 7 (ref 5–15)
BUN: 25 mg/dL — ABNORMAL HIGH (ref 8–23)
CO2: 20 mmol/L — ABNORMAL LOW (ref 22–32)
Calcium: 7.8 mg/dL — ABNORMAL LOW (ref 8.9–10.3)
Chloride: 104 mmol/L (ref 98–111)
Creatinine, Ser: 1.55 mg/dL — ABNORMAL HIGH (ref 0.61–1.24)
GFR, Estimated: 48 mL/min — ABNORMAL LOW (ref >60)
Glucose, Bld: 135 mg/dL — ABNORMAL HIGH (ref 70–99)
Potassium: 4 mmol/L (ref 3.5–5.1)
Sodium: 138 mmol/L (ref 135–145)

## 2023-05-29 LAB — CBC WITH DIFFERENTIAL/PLATELET
Abs Immature Granulocytes: 0.14 10*3/uL — ABNORMAL HIGH (ref 0.00–0.07)
Basophils Absolute: 0 10*3/uL (ref 0.0–0.1)
Basophils Relative: 0 %
Eosinophils Absolute: 0.5 10*3/uL (ref 0.0–0.5)
Eosinophils Relative: 4 %
HCT: 24.9 % — ABNORMAL LOW (ref 39.0–52.0)
Hemoglobin: 8 g/dL — ABNORMAL LOW (ref 13.0–17.0)
Immature Granulocytes: 1 %
Lymphocytes Relative: 15 %
Lymphs Abs: 1.9 10*3/uL (ref 0.7–4.0)
MCH: 29.4 pg (ref 26.0–34.0)
MCHC: 32.1 g/dL (ref 30.0–36.0)
MCV: 91.5 fL (ref 80.0–100.0)
Monocytes Absolute: 0.8 10*3/uL (ref 0.1–1.0)
Monocytes Relative: 7 %
Neutro Abs: 8.8 10*3/uL — ABNORMAL HIGH (ref 1.7–7.7)
Neutrophils Relative %: 73 %
Platelets: 278 10*3/uL (ref 150–400)
RBC: 2.72 MIL/uL — ABNORMAL LOW (ref 4.22–5.81)
RDW: 17.7 % — ABNORMAL HIGH (ref 11.5–15.5)
WBC: 12.1 10*3/uL — ABNORMAL HIGH (ref 4.0–10.5)
nRBC: 0 % (ref 0.0–0.2)

## 2023-05-29 LAB — GLUCOSE, CAPILLARY
Glucose-Capillary: 124 mg/dL — ABNORMAL HIGH (ref 70–99)
Glucose-Capillary: 143 mg/dL — ABNORMAL HIGH (ref 70–99)
Glucose-Capillary: 155 mg/dL — ABNORMAL HIGH (ref 70–99)
Glucose-Capillary: 188 mg/dL — ABNORMAL HIGH (ref 70–99)

## 2023-05-29 NOTE — Progress Notes (Signed)
Subjective  - POD #3             1.  Ultrasound guidance for vascular access left posterior tibial artery             2.  Catheter placement into left common femoral artery from left posterior tibial approach             3.  Selective left lower extremity angiogram             4.  Percutaneous transluminal angioplasty of left posterior tibial artery and tibioperoneal trunk with 3 and 4 mm diameter angioplasty balloons             5.  Percutaneous transluminal angioplasty of the entire left SFA and popliteal arteries with 5 mm diameter Lutonix drug-coated angioplasty balloons             6.  Stent placement to the left tibioperoneal trunk and popliteal artery with 6 mm diameter by 25 cm length Viabahn stent             7.  Stent placement to the left SFA and popliteal arteries with 6 mm diameter by 25 cm length 6 mm diameter by 10 cm length Viabahn stent    No complaints   Physical Exam:  Dressing intact Groins soft       Assessment/Plan:  POD #3  -continue asa, statin, plavix =abx through 6-26  Jordan Arellano 05/29/2023 2:04 PM --  Vitals:   05/29/23 0008 05/29/23 0752  BP: 132/66 123/62  Pulse: 81 73  Resp: 18 16  Temp: 100.3 F (37.9 C) 98.6 F (37 C)  SpO2: 96% 97%    Intake/Output Summary (Last 24 hours) at 05/29/2023 1404 Last data filed at 05/29/2023 1301 Gross per 24 hour  Intake 1622.63 ml  Output 800 ml  Net 822.63 ml     Laboratory CBC    Component Value Date/Time   WBC 12.1 (H) 05/29/2023 0430   HGB 8.0 (L) 05/29/2023 0430   HCT 24.9 (L) 05/29/2023 0430   PLT 278 05/29/2023 0430    BMET    Component Value Date/Time   NA 138 05/29/2023 0430   NA 139 02/03/2016 0000   K 4.0 05/29/2023 0430   CL 104 05/29/2023 0430   CO2 20 (L) 05/29/2023 0430   GLUCOSE 135 (H) 05/29/2023 0430   BUN 25 (H) 05/29/2023 0430   BUN 28 (A) 02/03/2016 0000   CREATININE 1.55 (H) 05/29/2023 0430   CALCIUM 7.8 (L) 05/29/2023 0430   GFRNONAA 48 (L)  05/29/2023 0430   GFRAA >60 10/02/2017 0943    COAG Lab Results  Component Value Date   INR 1.1 05/18/2023   INR 1.1 03/21/2023   INR 1.00 09/30/2017   No results found for: "PTT"  Antibiotics Anti-infectives (From admission, onward)    Start     Dose/Rate Route Frequency Ordered Stop   05/26/23 2200  doxycycline (VIBRA-TABS) tablet 100 mg        100 mg Oral Every 12 hours 05/26/23 1047 06/02/23 2159   05/26/23 1412  ceFAZolin (ANCEF) IVPB 2g/100 mL premix        2 g 200 mL/hr over 30 Minutes Intravenous 30 min pre-op 05/26/23 1412 05/26/23 1754   05/22/23 0830  vancomycin (VANCOCIN) IVPB 1000 mg/200 mL premix        1,000 mg 200 mL/hr over 60 Minutes Intravenous Every 24 hours 05/22/23 0734 05/26/23 1240   05/21/23 1032  vancomycin variable dose per unstable renal function (pharmacist dosing)  Status:  Discontinued         Does not apply See admin instructions 05/21/23 1032 05/22/23 0734   05/20/23 1400  ceFAZolin (ANCEF) IVPB 2g/100 mL premix        2 g 200 mL/hr over 30 Minutes Intravenous 30 min pre-op 05/20/23 1400 05/20/23 1552   05/19/23 2000  vancomycin (VANCOCIN) IVPB 1000 mg/200 mL premix  Status:  Discontinued        1,000 mg 200 mL/hr over 60 Minutes Intravenous Every 24 hours 05/18/23 1827 05/19/23 1034   05/19/23 2000  vancomycin (VANCOREADY) IVPB 1250 mg/250 mL  Status:  Discontinued        1,250 mg 166.7 mL/hr over 90 Minutes Intravenous Every 24 hours 05/19/23 1034 05/21/23 1032   05/18/23 1900  vancomycin (VANCOREADY) IVPB 1500 mg/300 mL        1,500 mg 150 mL/hr over 120 Minutes Intravenous  Once 05/18/23 1827 05/18/23 2120   05/18/23 1830  ceFEPIme (MAXIPIME) 2 g in sodium chloride 0.9 % 100 mL IVPB  Status:  Discontinued        2 g 200 mL/hr over 30 Minutes Intravenous Every 12 hours 05/18/23 1827 05/24/23 1122   05/18/23 1400  cefTRIAXone (ROCEPHIN) 2 g in sodium chloride 0.9 % 100 mL IVPB        2 g 200 mL/hr over 30 Minutes Intravenous  Once  05/18/23 1347 05/18/23 1438        V. Charlena Cross, M.D., [facility] [place] Office: [phone] Pager:  [phone]

## 2023-05-29 NOTE — Progress Notes (Addendum)
Mobility Specialist - Progress Note     05/29/23 1428  Mobility  Activity Transferred from chair to bed;Ambulated with assistance in room  Level of Assistance Moderate assist, patient does 50-74%  Assistive Device Front wheel walker  Distance Ambulated (ft) 2 ft  Range of Motion/Exercises Active  LLE Weight Bearing PWB  Activity Response Tolerated well  Mobility Referral Yes  $Mobility charge 1 Mobility  Mobility Specialist Start Time (ACUTE ONLY) 1401  Mobility Specialist Stop Time (ACUTE ONLY) 1428  Mobility Specialist Time Calculation (min) (ACUTE ONLY) 27 min   Pt resting in recliner on RA upon entry. Pt dons offloading shoe with assistance. Pt STS x3 ModA and 1-2 shuffle pivot transfers to bed with RW and manual assistance from author. Pt endorses fatigue once session is completed. Pt left in bed with needs in reach and bed alarm activated.    Johnathan Hausen Mobility Specialist 05/29/23, 3:33 PM

## 2023-05-29 NOTE — Plan of Care (Signed)
  Problem: Education: Goal: Knowledge of General Education information will improve Description: Including pain rating scale, medication(s)/side effects and non-pharmacologic comfort measures Outcome: Progressing   Problem: Health Behavior/Discharge Planning: Goal: Ability to manage health-related needs will improve Outcome: Progressing   Problem: Clinical Measurements: Goal: Diagnostic test results will improve Outcome: Progressing   Problem: Nutrition: Goal: Adequate nutrition will be maintained Outcome: Progressing   Problem: Elimination: Goal: Will not experience complications related to bowel motility Outcome: Progressing Goal: Will not experience complications related to urinary retention Outcome: Progressing   Problem: Pain Managment: Goal: General experience of comfort will improve Outcome: Progressing   Problem: Skin Integrity: Goal: Risk for impaired skin integrity will decrease Outcome: Progressing   

## 2023-05-29 NOTE — Plan of Care (Signed)
  Problem: Activity: Goal: Risk for activity intolerance will decrease Outcome: Progressing   Problem: Nutrition: Goal: Adequate nutrition will be maintained Outcome: Progressing   Problem: Pain Managment: Goal: General experience of comfort will improve Outcome: Progressing   

## 2023-05-29 NOTE — Progress Notes (Signed)
Progress Note    Jordan Arellano  MVH:[id number] DOB: [birth year]  DOA: 05/18/2023 PCP: Mort Sawyers, FNP      Brief Narrative:    Medical records reviewed and are as summarized below:  Jordan Arellano is a 72 y.o. male hypertension, hyperlipidemia, depression, TIA, stroke, PAD, presented to the hospital with left foot pain associated with redness and swelling.  He was found to have osteomyelitis of the left first and third toes.  He was treated with empiric IV antibiotics.  He underwent stent placement to right common iliac artery and right external iliac artery on 05/20/2023.  He also underwent amputation of left great toe and amputation of left third toe.  He developed persistent hypotension which did not respond to IV fluids.  This necessitated transfer to the ICU for vasopressors for septic shock.        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) Active Problems:   AKI (acute kidney injury) (HCC)   Essential hypertension   Dyslipidemia   Depression   History of CVA (cerebrovascular accident) without residual deficits   Tobacco abuse   PAD s/p right BKA, history revascularization left leg   Hypertriglyceridemia   Status post femoral-popliteal bypass surgery   Left renal artery stenosis (HCC)   Recurrent major depressive disorder, in full remission (HCC)   Heart failure with preserved ejection fraction (HCC)   Encounter for tobacco use cessation counseling   At risk for constipation   Acute hematogenous osteomyelitis of left foot (HCC)   Protein-calorie malnutrition, severe   Nutrition Problem: Severe Malnutrition Etiology: social / environmental circumstances  Signs/Symptoms: mild fat depletion, moderate fat depletion, moderate muscle depletion, severe muscle depletion, percent weight loss Percent weight loss: 8.4 %   Body mass index is 18.73 kg/m.   Septic shock secondary to acute  osteomyelitis of left first and third toes,? septic arthritis left great toe IP joint: Sepsis physiology has resolved. Left foot culture wound showed Staphylococcus lugdunensis.  No growth on blood culture.  Continue doxycycline through 06/01/2023. Pathology report: Left great toe and third toe with soft tissue necrosis and acute osteomyelitis. Viable soft tissue and articular surface of bone at resection margins negative for acute osteomyelitis.   PAD, ischemic limb of left lower extremity: S/p stent placement to left tibioperoneal trunk and popliteal artery, stent placement to left SFA and popliteal arteries on 05/26/2023.  Follow-up with vascular surgeon.  S/p stent placement to right common iliac artery and right external iliac artery on 05/20/2023 History of stroke Continue rosuvastatin, aspirin and Plavix   AKI: Creatinine is trending down (from 1.65-1.55). Continue IV Ringer's lactate infusion and repeat BMP tomorrow.  Recent exposure to contrast from lower extremity angiogram on 05/26/2023.     Anemia of chronic disease: Hemoglobin is down from 8.7-8.  No indication for blood transfusion at this time.  S/p transfusion of 1 unit of PRBCs on 05/27/2023.   Hypokalemia: Improved   Type II DM: NovoLog as needed for hyperglycemia.   Other comorbidities include hypertension, hyperlipidemia   Diet Order             Diet Heart Room service appropriate? Yes; Fluid consistency: Thin  Diet effective now                            Consultants: Vascular surgeon Intensivist Podiatrist  Procedures: Amputation left great toe at  the IPJ, amputation left third toe metatarsophalangeal joint on 05/21/2023 Stent placement to right common iliac artery and right external iliac artery on 05/20/2023   Medications:    vitamin C  500 mg Oral BID   aspirin EC  81 mg Oral Daily   Chlorhexidine Gluconate Cloth  6 each Topical Daily   clopidogrel  75 mg Oral Daily   cyanocobalamin   1,000 mcg Oral Daily   doxycycline  100 mg Oral Q12H   feeding supplement  237 mL Oral TID BM   folic acid  1 mg Oral Daily   insulin aspart  0-15 Units Subcutaneous TID WC   insulin aspart  0-5 Units Subcutaneous QHS   latanoprost  1 drop Both Eyes QHS   multivitamin with minerals  1 tablet Oral Daily   polyethylene glycol  17 g Oral Daily   rosuvastatin  40 mg Oral QHS   sertraline  100 mg Oral Daily   zinc sulfate  220 mg Oral Daily   Continuous Infusions:  sodium chloride Stopped (05/21/23 0815)     Anti-infectives (From admission, onward)    Start     Dose/Rate Route Frequency Ordered Stop   05/26/23 2200  doxycycline (VIBRA-TABS) tablet 100 mg        100 mg Oral Every 12 hours 05/26/23 1047 06/02/23 2159   05/26/23 1412  ceFAZolin (ANCEF) IVPB 2g/100 mL premix        2 g 200 mL/hr over 30 Minutes Intravenous 30 min pre-op 05/26/23 1412 05/26/23 1754   05/22/23 0830  vancomycin (VANCOCIN) IVPB 1000 mg/200 mL premix        1,000 mg 200 mL/hr over 60 Minutes Intravenous Every 24 hours 05/22/23 0734 05/26/23 1240   05/21/23 1032  vancomycin variable dose per unstable renal function (pharmacist dosing)  Status:  Discontinued         Does not apply See admin instructions 05/21/23 1032 05/22/23 0734   05/20/23 1400  ceFAZolin (ANCEF) IVPB 2g/100 mL premix        2 g 200 mL/hr over 30 Minutes Intravenous 30 min pre-op 05/20/23 1400 05/20/23 1552   05/19/23 2000  vancomycin (VANCOCIN) IVPB 1000 mg/200 mL premix  Status:  Discontinued        1,000 mg 200 mL/hr over 60 Minutes Intravenous Every 24 hours 05/18/23 1827 05/19/23 1034   05/19/23 2000  vancomycin (VANCOREADY) IVPB 1250 mg/250 mL  Status:  Discontinued        1,250 mg 166.7 mL/hr over 90 Minutes Intravenous Every 24 hours 05/19/23 1034 05/21/23 1032   05/18/23 1900  vancomycin (VANCOREADY) IVPB 1500 mg/300 mL        1,500 mg 150 mL/hr over 120 Minutes Intravenous  Once 05/18/23 1827 05/18/23 2120   05/18/23 1830   ceFEPIme (MAXIPIME) 2 g in sodium chloride 0.9 % 100 mL IVPB  Status:  Discontinued        2 g 200 mL/hr over 30 Minutes Intravenous Every 12 hours 05/18/23 1827 05/24/23 1122   05/18/23 1400  cefTRIAXone (ROCEPHIN) 2 g in sodium chloride 0.9 % 100 mL IVPB        2 g 200 mL/hr over 30 Minutes Intravenous  Once 05/18/23 1347 05/18/23 1438              Family Communication/Anticipated D/C date and plan/Code Status   DVT prophylaxis: Place TED hose Start: 05/18/23 1434     Code Status: Full Code  Family Communication: None Disposition Plan: Plan  to discharge home tomorrow   Status is: Inpatient Remains inpatient appropriate because: AKI       Subjective:   Interval events noted.  No shortness of breath, chest pain.  No pain in the left foot.  He has adequate urine output.  Objective:    Vitals:   05/28/23 1542 05/28/23 1545 05/29/23 0008 05/29/23 0752  BP: 118/64 (!) 142/67 132/66 123/62  Pulse: 74 78 81 73  Resp: 17 17 18 16   Temp: 98.7 F (37.1 C) 99.7 F (37.6 C) 100.3 F (37.9 C) 98.6 F (37 C)  TempSrc:      SpO2: 100% 91% 96% 97%  Weight:      Height:       No data found.   Intake/Output Summary (Last 24 hours) at 05/29/2023 1450 Last data filed at 05/29/2023 1301 Gross per 24 hour  Intake 1622.63 ml  Output 800 ml  Net 822.63 ml   Filed Weights   05/19/23 2354 05/20/23 1444 05/26/23 1521  Weight: 64.4 kg 64.4 kg 64.4 kg    Exam:   GEN: NAD SKIN: No rash EYES: EOMI ENT: MMM CV: RRR PULM: No rales or wheezing. ABD: soft, ND, NT, +BS CNS: AAO x 3, non focal EXT: Right BKA.  Dressing on left foot is clean, dry and intact      Data Reviewed:   I have personally reviewed following labs and imaging studies:  Labs: Labs show the following:   Basic Metabolic Panel: Recent Labs  Lab 05/23/23 0529 05/24/23 0437 05/25/23 0433 05/26/23 0342 05/27/23 0417 05/28/23 0427 05/29/23 0430  NA 136 133* 136 138 135 138 138  K 3.9  3.4* 3.8 3.5 3.4* 4.6 4.0  CL 109 105 109 108 106 109 104  CO2 20* 20* 20* 22 24 21* 20*  GLUCOSE 120* 180* 126* 133* 208* 153* 135*  BUN 20 19 17 18 18  24* 25*  CREATININE 1.29* 1.34* 1.24 1.22 1.37* 1.65* 1.55*  CALCIUM 8.5* 8.2* 8.0* 8.4* 7.7* 8.2* 7.8*  MG 1.6* 2.1 1.8  --  1.9  --   --    GFR Estimated Creatinine Clearance: 40.4 mL/min (A) (by C-G formula based on SCr of 1.55 mg/dL (H)). Liver Function Tests: No results for input(s): "AST", "ALT", "ALKPHOS", "BILITOT", "PROT", "ALBUMIN" in the last 168 hours.  No results for input(s): "LIPASE", "AMYLASE" in the last 168 hours. No results for input(s): "AMMONIA" in the last 168 hours. Coagulation profile No results for input(s): "INR", "PROTIME" in the last 168 hours.   CBC: Recent Labs  Lab 05/25/23 0433 05/26/23 0342 05/27/23 0417 05/28/23 0427 05/29/23 0430  WBC 9.9 10.9* 16.1* 14.2* 12.1*  NEUTROABS  --  6.2  --  10.6* 8.8*  HGB 7.3* 7.5* 7.3* 8.7* 8.0*  HCT 22.3* 23.0* 22.9* 27.3* 24.9*  MCV 91.4 92.4 92.7 92.2 91.5  PLT 161 185 198 241 278   Cardiac Enzymes: No results for input(s): "CKTOTAL", "CKMB", "CKMBINDEX", "TROPONINI" in the last 168 hours. BNP (last 3 results) No results for input(s): "PROBNP" in the last 8760 hours. CBG: Recent Labs  Lab 05/28/23 1127 05/28/23 1700 05/28/23 2122 05/29/23 0758 05/29/23 1129  GLUCAP 171* 155* 137* 124* 143*   D-Dimer: No results for input(s): "DDIMER" in the last 72 hours. Hgb A1c: No results for input(s): "HGBA1C" in the last 72 hours. Lipid Profile: No results for input(s): "CHOL", "HDL", "LDLCALC", "TRIG", "CHOLHDL", "LDLDIRECT" in the last 72 hours. Thyroid function studies: No results for input(s): "TSH", "T4TOTAL", "  T3FREE", "THYROIDAB" in the last 72 hours.  Invalid input(s): "FREET3" Anemia work up: No results for input(s): "VITAMINB12", "FOLATE", "FERRITIN", "TIBC", "IRON", "RETICCTPCT" in the last 72 hours. Sepsis Labs: Recent Labs  Lab  05/26/23 0342 05/27/23 0417 05/28/23 0427 05/29/23 0430  WBC 10.9* 16.1* 14.2* 12.1*    Microbiology Recent Results (from the past 240 hour(s))  Culture, blood (Routine X 2) w Reflex to ID Panel     Status: None   Collection Time: 05/20/23 10:58 PM   Specimen: BLOOD  Result Value Ref Range Status   Specimen Description   Final    BLOOD BLOOD RIGHT ARM Performed at [facility], [address], [place], [place] [address]    Special Requests   Final    BOTTLES DRAWN AEROBIC AND ANAEROBIC Blood Culture adequate volume Performed at [facility], [address], [place], [place] [address]    Culture   Final    NO GROWTH 5 DAYS Performed at [facility], [address], [place], [place] [address]    Report Status 05/25/2023 FINAL  Final  Culture, blood (Routine X 2) w Reflex to ID Panel     Status: None   Collection Time: 05/20/23 11:53 PM   Specimen: BLOOD  Result Value Ref Range Status   Specimen Description BLOOD BLOOD LEFT ARM  Final   Special Requests   Final    BOTTLES DRAWN AEROBIC AND ANAEROBIC Blood Culture adequate volume   Culture   Final    NO GROWTH 5 DAYS Performed at [facility], [address] [place], [place] [address]    Report Status 05/26/2023 FINAL  Final  Aerobic/Anaerobic Culture w Gram Stain (surgical/deep wound)     Status: None   Collection Time: 05/21/23  9:13 AM   Specimen: Bone; Tissue  Result Value Ref Range Status   Specimen Description   Final    BONE Performed at [facility], [address] [place], [place] [address]    Special Requests   Final    NONE Performed at [facility], [address] [place], [place] [address]    Gram Stain   Final    MODERATE WBC PRESENT, PREDOMINANTLY PMN RARE GRAM POSITIVE COCCI RARE GRAM POSITIVE RODS CRITICAL RESULT CALLED TO, READ BACK BY AND VERIFIED WITH: RN Georgian Co [id number] AT 1457 BY EC    Culture   Final    FEW STAPHYLOCOCCUS  LUGDUNENSIS RARE DIPHTHEROIDS(CORYNEBACTERIUM SPECIES) Standardized susceptibility testing for this organism is not available. NO ANAEROBES ISOLATED Performed at [facility], [address], [place], [place] [address]    Report Status 05/26/2023 FINAL  Final   Organism ID, Bacteria STAPHYLOCOCCUS LUGDUNENSIS  Final      Susceptibility   Staphylococcus lugdunensis - MIC*    CIPROFLOXACIN <=0.5 SENSITIVE Sensitive     ERYTHROMYCIN <=0.25 SENSITIVE Sensitive     GENTAMICIN <=0.5 SENSITIVE Sensitive     OXACILLIN >=4 RESISTANT Resistant     TETRACYCLINE <=1 SENSITIVE Sensitive     VANCOMYCIN <=0.5 SENSITIVE Sensitive     TRIMETH/SULFA <=10 SENSITIVE Sensitive     CLINDAMYCIN <=0.25 SENSITIVE Sensitive     RIFAMPIN <=0.5 SENSITIVE Sensitive     Inducible Clindamycin NEGATIVE Sensitive     * FEW STAPHYLOCOCCUS LUGDUNENSIS    Procedures and diagnostic studies:  No results found.             LOS: 11 days   Saphyra Hutt  [facility]  Pager on [email or web address]. If 7PM-7AM, please contact night-coverage at [email or web address]     05/29/2023, 2:50 PM

## 2023-05-30 DIAGNOSIS — I7025 Atherosclerosis of native arteries of other extremities with ulceration: Secondary | ICD-10-CM | POA: Diagnosis not present

## 2023-05-30 DIAGNOSIS — L97509 Non-pressure chronic ulcer of other part of unspecified foot with unspecified severity: Secondary | ICD-10-CM | POA: Diagnosis not present

## 2023-05-30 LAB — CBC
HCT: 25 % — ABNORMAL LOW (ref 39.0–52.0)
Hemoglobin: 8.1 g/dL — ABNORMAL LOW (ref 13.0–17.0)
MCH: 29.8 pg (ref 26.0–34.0)
MCHC: 32.4 g/dL (ref 30.0–36.0)
MCV: 91.9 fL (ref 80.0–100.0)
Platelets: 360 10*3/uL (ref 150–400)
RBC: 2.72 MIL/uL — ABNORMAL LOW (ref 4.22–5.81)
RDW: 17.6 % — ABNORMAL HIGH (ref 11.5–15.5)
WBC: 11.7 10*3/uL — ABNORMAL HIGH (ref 4.0–10.5)
nRBC: 0 % (ref 0.0–0.2)

## 2023-05-30 LAB — BASIC METABOLIC PANEL
Anion gap: 9 (ref 5–15)
BUN: 28 mg/dL — ABNORMAL HIGH (ref 8–23)
CO2: 21 mmol/L — ABNORMAL LOW (ref 22–32)
Calcium: 8.2 mg/dL — ABNORMAL LOW (ref 8.9–10.3)
Chloride: 109 mmol/L (ref 98–111)
Creatinine, Ser: 1.45 mg/dL — ABNORMAL HIGH (ref 0.61–1.24)
GFR, Estimated: 52 mL/min — ABNORMAL LOW (ref >60)
Glucose, Bld: 154 mg/dL — ABNORMAL HIGH (ref 70–99)
Potassium: 3.8 mmol/L (ref 3.5–5.1)
Sodium: 139 mmol/L (ref 135–145)

## 2023-05-30 LAB — GLUCOSE, CAPILLARY
Glucose-Capillary: 135 mg/dL — ABNORMAL HIGH (ref 70–99)
Glucose-Capillary: 213 mg/dL — ABNORMAL HIGH (ref 70–99)
Glucose-Capillary: 127 mg/dL — ABNORMAL HIGH (ref 70–99)

## 2023-05-30 MED ORDER — NICOTINE 14 MG/24HR TD PT24: 14.0000 mg | MEDICATED_PATCH | Freq: Every day | TRANSDERMAL | 0 refills | Status: DC | PRN

## 2023-05-30 MED ORDER — DOXYCYCLINE HYCLATE 100 MG PO TABS: 100.0000 mg | ORAL_TABLET | Freq: Two times a day (BID) | ORAL | 0 refills | Status: DC

## 2023-05-30 NOTE — Progress Notes (Signed)
Progress Note    05/30/2023 2:14 PM 4 Days Post-Op  Subjective:  Jordan Arellano is a 72 y.o. male hypertension, hyperlipidemia, depression, TIA, stroke, PAD, presented to the hospital with left foot pain associated with redness and swelling.  He was found to have osteomyelitis of the left first and third toes.  He was treated with empiric IV antibiotics.   On 05/20/2023 he underwent left  lower extremity angiogram with angioplasty and stent placement.  Podiatry also underwent amputation of the left great toe and third toe.  He had developed persistent hypotension after surgery requiring ICU for vasopressors.  Patient's left lower extremity has been revascularized.  Patient is recovering as expected.  No complaints via the patient today.  Vitals all remained stable.  Patient scheduled for discharge today.  Patient's sister at the bedside this afternoon and disagrees with patient discharge to home.  Apparently there was some confusion as to the patient's telling the team he lives with his sister which she does not.  Sister claims she purchased a second home for her brother that he lives alone in but she does take care of him such as check on him and buy his groceries.  She feels after coming to the hospital today that he is not ready to be alone at home.  And is requesting that he go to rehab as he did the last admission prior to coming home.  I informed her I would contact the primary team to try to help set up placement. Discharge is on hold for today.   Vitals:   05/30/23 0723 05/30/23 0755  BP: 122/70 119/62  Pulse: 63 72  Resp: 16 14  Temp: 98.2 F (36.8 C) 98.7 F (37.1 C)  SpO2: 98% 96%   Physical Exam: Cardiac:  RRR, Normal S1, S2, No Murmurs Lungs:  clear on auscultation throughout but diminished in the bases. No rales, rhonchi or wheezing Incisions:  Left post tibial artery with dressing clean dry and intact.  Extremities:  Left lower extremity with dressing to foot via Podiatry.  Left lower extremity is warm to touch with positive palpable posttibial pulse.  Abdomen:  Positive bowel sound all 4 quads. Soft, flat non tender and non distended Neurologic: AAOX3 and follows all commands appropriately.   CBC    Component Value Date/Time   WBC 11.7 (H) 05/30/2023 0800   RBC 2.72 (L) 05/30/2023 0800   HGB 8.1 (L) 05/30/2023 0800   HCT 25.0 (L) 05/30/2023 0800   PLT 360 05/30/2023 0800   MCV 91.9 05/30/2023 0800   MCH 29.8 05/30/2023 0800   MCHC 32.4 05/30/2023 0800   RDW 17.6 (H) 05/30/2023 0800   LYMPHSABS 1.9 05/29/2023 0430   MONOABS 0.8 05/29/2023 0430   EOSABS 0.5 05/29/2023 0430   BASOSABS 0.0 05/29/2023 0430    BMET    Component Value Date/Time   NA 139 05/30/2023 0800   NA 139 02/03/2016 0000   K 3.8 05/30/2023 0800   CL 109 05/30/2023 0800   CO2 21 (L) 05/30/2023 0800   GLUCOSE 154 (H) 05/30/2023 0800   BUN 28 (H) 05/30/2023 0800   BUN 28 (A) 02/03/2016 0000   CREATININE 1.45 (H) 05/30/2023 0800   CALCIUM 8.2 (L) 05/30/2023 0800   GFRNONAA 52 (L) 05/30/2023 0800   GFRAA >60 10/02/2017 0943    INR    Component Value Date/Time   INR 1.1 05/18/2023 1521     Intake/Output Summary (Last 24 hours) at 05/30/2023 1414 Last data filed  at 05/30/2023 0900 Gross per 24 hour  Intake 0 ml  Output 1050 ml  Net -1050 ml     Assessment/Plan:  72 y.o. male is s/p Left lower extremity angiogram with angioplasty and stent placement  4 Days Post-Op   PLAN: Per vascular surgery Okay for patient to discharge. Will follow up in 6 weeks with bilateral duplex lower extremity U/S and ABI's Patient sister asks for placement of the patient as she feels he is not ready to be home alone and care for himself.  Patient to follow up with Podiatry as planned. Patient to be discharged on ASA 81 mg daily and Plavix 75 mg daily  DVT prophylaxis:  ASA 81 mg daily and Plavix 75 mg Daily    Jordan Arellano [facility] 05/30/2023 2:14 PM

## 2023-05-30 NOTE — TOC Progression Note (Signed)
Transition of Care [facility]) - Progression Note    Patient Details  Name: Jordan Arellano MRN: [id number] Date of Birth: 1951/04/26  [facility] CM/SW Contact  Garret Reddish, RN Phone Number: 05/30/2023, 4:20 PM  Clinical Narrative:   Chart reviewed.  Noted that patient has orders for discharge today. I have spoken with POA [name].  She informs me that she does not feel like patient is able to discharge home.  [name] reports that she feels like patient is unsteady on his feet and she feels like he would be a risk for falling at home.  [name] has requested to speak with the Doctor and would like for PT/OT to see patient while she is present today.  I have made all disciplines aware.    I have informed [name] that patient does have the right to refuse SNF.  [name] will try and talk to [name] about STR.    Will follow up with patient and speak with him about SNF for STR.    TOC will continue to follow for discharge planning.       Expected Discharge Plan: Home w Home Health Services (Home with Home Health v/s SNF) Barriers to Discharge: No Barriers Identified  Expected Discharge Plan and Services   Discharge Planning Services: CM Consult Post Acute Care Choice: Home Health Living arrangements for the past 2 months: Single Family Home Expected Discharge Date: 05/30/23                                     Social Determinants of Health (SDOH) Interventions SDOH Screenings   Food Insecurity: No Food Insecurity (05/19/2023)  Housing: Low Risk  (05/19/2023)  Transportation Needs: No Transportation Needs (05/19/2023)  Utilities: Not At Risk (05/19/2023)  Alcohol Screen: Low Risk  (04/07/2022)  Depression (PHQ2-9): Low Risk  (04/07/2022)  Financial Resource Strain: Low Risk  (10/08/2021)  Physical Activity: Insufficiently Active (04/07/2022)  Social Connections: Socially Isolated (04/07/2022)  Stress: No Stress Concern Present (04/07/2022)  Tobacco Use: High Risk (05/27/2023)     Readmission Risk Interventions     No data to display

## 2023-05-30 NOTE — Progress Notes (Signed)
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 pmh of CVA, HTN, tobacco use, PAD, hypergliceridemia, left renal stenosis, DLD, depression and heart failure.   OT comments  Pt seen for OT treatment on this date. Upon arrival to room pt seated in recliner, agreeable to tx. Pt requires MIN A for sit<>stand with RW and surgical shoe. MIN A don briefs and pants to clear hips. Pt doffed shoe sitting EOB. Pt making good progress toward goals, will continue to follow POC. Discharge recommendation remains appropriate, as pt remains high fall risk. Pt would benefit from +1 assist for ADLs at home.      Recommendations for follow up therapy are one component of a multi-disciplinary discharge planning process, led by the attending physician.  Recommendations may be updated based on patient status, additional functional criteria and insurance authorization.    Assistance Recommended at Discharge Intermittent Supervision/Assistance  Patient can return home with the following  A little help with walking and/or transfers;A little help with bathing/dressing/bathroom;Direct supervision/assist for medications management;Direct supervision/assist for financial management;Assistance with cooking/housework   Equipment Recommendations  BSC/3in1    Recommendations for Other Services      Precautions / Restrictions Precautions Precautions: Fall Restrictions Weight Bearing Restrictions: Yes LLE Weight Bearing: Partial weight bearing LLE Partial Weight Bearing Percentage or Pounds: 50 Other Position/Activity Restrictions: surgical shoe needed to PWB on LLE.       Mobility Bed Mobility Overal bed mobility: Needs Assistance Bed Mobility: Sit to Supine        Sit to supine: Independent   General bed mobility comments: HOB elevated    Transfers Overall transfer level: Needs assistance Equipment used: Rolling walker (2 wheels) Transfers: Sit to/from Stand, Bed to chair/wheelchair/BSC Sit to Stand: Min assist     Step pivot transfers: Min guard           Balance Overall balance assessment: Needs assistance Sitting-balance support: Feet supported, No upper extremity supported Sitting balance-Leahy Scale: Normal Sitting balance - Comments: Sitting EOB to doff surgical shoe   Standing balance support: Bilateral upper extremity supported, During functional activity Standing balance-Leahy Scale: Good Standing balance comment: Able to pull up and button pants while standing within RW                           ADL either performed or assessed with clinical judgement   ADL Overall ADL's : Needs assistance/impaired                     Lower Body Dressing: Minimal assistance;Sit to/from stand Lower Body Dressing Details (indicate cue type and reason): I doffed shoe, MIN A donning briefs and pants sit<>stand.             Functional mobility during ADLs: Min guard;Minimal assistance;Rolling walker (2 wheels)      Extremity/Trunk Assessment Upper Extremity Assessment Upper Extremity Assessment: Overall WFL for tasks assessed   Lower Extremity Assessment Lower Extremity Assessment: Overall WFL for tasks assessed        Vision       Perception     Praxis      Cognition Arousal/Alertness: Awake/alert Behavior During Therapy: WFL for tasks assessed/performed Overall Cognitive Status: Within Functional Limits for tasks assessed  Exercises      Shoulder Instructions       General Comments      Pertinent Vitals/ Pain       Pain Assessment Pain Assessment: 0-10 Pain Score: 6  Pain Location: Left foot Pain Descriptors / Indicators:  Discomfort Pain Intervention(s): Monitored during session  Home Living                                          Prior Functioning/Environment              Frequency  Min 1X/week        Progress Toward Goals  OT Goals(current goals can now be found in the care plan section)  Progress towards OT goals: Progressing toward goals  Acute Rehab OT Goals Patient Stated Goal: To go home OT Goal Formulation: With patient Time For Goal Achievement: 06/09/23 Potential to Achieve Goals: Good ADL Goals Pt Will Perform Lower Body Dressing: sitting/lateral leans;with min guard assist;with supervision Pt Will Transfer to Toilet: with min guard assist;ambulating;grab bars  Plan Discharge plan remains appropriate    Co-evaluation                 AM-PAC OT "6 Clicks" Daily Activity     Outcome Measure   Help from another person eating meals?: None Help from another person taking care of personal grooming?: None Help from another person toileting, which includes using toliet, bedpan, or urinal?: A Little Help from another person bathing (including washing, rinsing, drying)?: A Little Help from another person to put on and taking off regular upper body clothing?: A Little Help from another person to put on and taking off regular lower body clothing?: A Little 6 Click Score: 20    End of Session Equipment Utilized During Treatment: Rolling walker (2 wheels);Other (comment) (surgical shoe)  OT Visit Diagnosis: Other abnormalities of gait and mobility (R26.89);Muscle weakness (generalized) (M62.81);Unsteadiness on feet (R26.81)   Activity Tolerance Patient tolerated treatment well   Patient Left in bed;with nursing/sitter in room;with call bell/phone within reach   Nurse Communication          Time: 6644-0347 OT Time Calculation (min): 9 min  Charges: OT General Charges $OT Visit: 1 Visit OT Treatments $Self Care/Home Management : 8-22  mins  Thresa Ross, OTS

## 2023-05-30 NOTE — Care Management Important Message (Signed)
Important Message  Patient Details  Name: Jordan Arellano MRN: [id number] Date of Birth: [birth year]   Medicare Important Message Given:  Yes     Olegario Messier A Whitfield Dulay 05/30/2023, 10:54 AM

## 2023-05-30 NOTE — Progress Notes (Signed)
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 pmh of CVA, HTN, tobacco use, PAD, hypergliceridemia, left renal stenosis, DLD, depression and heart failure.    PT Comments    Discussed with care team. Family requesting additional PT visit while present in room. Upon arrival, pt agreeable to mobility attempts. Assisted in donning post op shoe. Pt able to ambulate in room. Family filmed on smart phone during session with this writer asking not to be in the video. Family acknowledged but continued to film. Pt very unsteady during turns and has difficulty with transfers from low surface. Pt also with decreased retention between sessions and poor carry over. At end of session, therapeutic listening given to family as they described complex psychosocial family dynamics. Family requesting to have pt re-assessed by podiatry and vascular- this writer informed care team via secure chat. After session, discussed with care team regarding POC. Will continue to progress as able.  Recommendations for follow up therapy are one component of a multi-disciplinary discharge planning process, led by the attending physician.  Recommendations may be updated based on patient status, additional functional criteria and insurance authorization.  Follow Up Recommendations  Can patient physically be transported by private vehicle: Yes    Assistance Recommended at Discharge Intermittent Supervision/Assistance  Patient can return home with the following A little help with walking and/or transfers;Assistance with cooking/housework;Assist for transportation;Help with stairs or ramp for entrance   Equipment Recommendations  Rolling walker (2 wheels)    Recommendations for  Other Services       Precautions / Restrictions Precautions Precautions: Fall Restrictions Weight Bearing Restrictions: Yes LLE Weight Bearing: Partial weight bearing LLE Partial Weight Bearing Percentage or Pounds: 50 Other Position/Activity Restrictions: surgical shoe needed to PWB on LLE.     Mobility  Bed Mobility Overal bed mobility: Independent             General bed mobility comments: ease of mobility transitioning from supine->sit. Left sitting at EOB at end of transfer.    Transfers Overall transfer level: Needs assistance Equipment used: Rolling walker (2 wheels) Transfers: Sit to/from Stand Sit to Stand: Mod assist           General transfer comment: unable to stand from low surface, bed elevated and needed mod assist for sit<>Stand. Assist required for donning surgical shoe.    Ambulation/Gait Ambulation/Gait assistance: Min guard Gait Distance (Feet): 40 Feet Assistive device: Rolling walker (2 wheels) Gait Pattern/deviations: Step-to pattern       General Gait Details: ambulated in room using RW. Multiple bouts of unsteadiness noted during turns with mind assist for recovery. Heavy education used for safe equipment handling during mobility training.   Stairs             Wheelchair Mobility    Modified Rankin (Stroke Patients Only)       Balance Overall balance assessment: Needs assistance Sitting-balance support: Feet supported, No upper extremity supported Sitting balance-Leahy Scale: Normal Sitting balance - Comments: Sitting EOB to doff surgical shoe and prosthesis.   Standing balance support: Bilateral upper extremity supported, During functional activity, Reliant on assistive device for balance Standing balance-Leahy Scale: Fair  Cognition Arousal/Alertness: Awake/alert Behavior During Therapy: WFL for tasks assessed/performed Overall Cognitive Status: Within Functional Limits for  tasks assessed                                          Exercises Other Exercises Other Exercises: Stood at bedside to assist with use of urinal. Mod bout of unsteadiness noted when pt attempting to stand and urinate Other Exercises: long discussion with family about PT needs    General Comments        Pertinent Vitals/Pain Pain Assessment Pain Assessment: No/denies pain    Home Living                          Prior Function            PT Goals (current goals can now be found in the care plan section) Acute Rehab PT Goals Patient Stated Goal: to go home PT Goal Formulation: With patient Time For Goal Achievement: 06/08/23 Potential to Achieve Goals: Good Progress towards PT goals: Progressing toward goals    Frequency    Min 3X/week      PT Plan Discharge plan needs to be updated    Co-evaluation              AM-PAC PT "6 Clicks" Mobility   Outcome Measure  Help needed turning from your back to your side while in a flat bed without using bedrails?: None Help needed moving from lying on your back to sitting on the side of a flat bed without using bedrails?: None Help needed moving to and from a bed to a chair (including a wheelchair)?: A Little Help needed standing up from a chair using your arms (e.g., wheelchair or bedside chair)?: A Lot Help needed to walk in hospital room?: A Little Help needed climbing 3-5 steps with a railing? : A Lot 6 Click Score: 18    End of Session Equipment Utilized During Treatment: Gait belt Activity Tolerance: Patient tolerated treatment well Patient left: in bed;with bed alarm set Nurse Communication: Mobility status PT Visit Diagnosis: Unsteadiness on feet (R26.81);Other abnormalities of gait and mobility (R26.89);Difficulty in walking, not elsewhere classified (R26.2);History of falling (Z91.81)     Time: 2725-3664 PT Time Calculation (min) (ACUTE ONLY): 27 min  Charges:  $Gait  Training: 23-37 mins $Therapeutic Exercise: 8-22 mins                    Elizabeth Palau, PT, DPT, GCS      Jordan Arellano 05/30/2023, 2:17 PM

## 2023-05-30 NOTE — Progress Notes (Signed)
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 pmh of CVA, HTN, tobacco use, PAD, hypergliceridemia, left renal stenosis, DLD, depression and heart failure.    PT Comments    Patient laying in bed at start of session stating he was experiencing some discomfort in his left calf. Confirmed that he received medication before the start of his session for the pain. Patient able to verbalize precautions back to SPT and remembers prior sessions. Pt supine<>sit independently to EOB, normal sitting balance to don prosthesis. Assistance needed for donning orthotic shoe. Elevated bed for sit<> stand, min assist for standing with RW. Ambulated 10 ft with min assist demonstrating step to pattern. Returned to chair due to discomfort. Performed seated there-ex; hip marches b/l x 10 and LAQ's x 10 LLE.  Left in chair with call bell, phone and alarm set. Discussed with patient how he felt about the session and what he did today. Confirmed whether he felt it was good enough to home safely. Patient said he felt safe going home and that he could do what he needs at home with the distance we walked today. Communicated with nursing patients mobility status.    Recommendations for follow up therapy are one component of a multi-disciplinary discharge planning process, led by the attending physician.  Recommendations may be updated based on patient status, additional functional criteria and insurance authorization.  Follow Up Recommendations       Assistance Recommended at Discharge Intermittent Supervision/Assistance  Patient can return home with the following A little help with walking and/or transfers;Assistance with cooking/housework;Assist for transportation;Help  with stairs or ramp for entrance   Equipment Recommendations  Rolling walker (2 wheels)    Recommendations for Other Services       Precautions / Restrictions Precautions Precautions: Fall Restrictions Weight Bearing Restrictions: Yes LLE Weight Bearing: Partial weight bearing LLE Partial Weight Bearing Percentage or Pounds: 50 Other Position/Activity Restrictions: surgical shoe needed to PWB on LLE.     Mobility  Bed Mobility Overal bed mobility: Needs Assistance Bed Mobility: Sit to Supine     Supine to sit: Independent Sit to supine: Independent   General bed mobility comments: HOB elevated    Transfers Overall transfer level: Needs assistance Equipment used: Rolling walker (2 wheels) Transfers: Sit to/from Stand Sit to Stand: Min assist           General transfer comment: Patient able to sit<>stand min assist with gait belt and RW. Bed elevated to assist with standing.    Ambulation/Gait Ambulation/Gait assistance: Min assist Gait Distance (Feet): 10 Feet Assistive device: Rolling walker (2 wheels) Gait Pattern/deviations: Step-to pattern, Decreased step length - right, Decreased step length - left, Narrow base of support, Trunk flexed, Decreased weight shift to left Gait velocity: Decreased     General Gait Details: Pt able to ambulate with use of RW and min assist. Ambulation decreased due to pain in left calf.   Stairs             Wheelchair Mobility    Modified Rankin (Stroke Patients Only)       Balance Overall balance assessment: Needs assistance Sitting-balance support: Feet supported, No upper extremity supported Sitting balance-Leahy Scale: Normal Sitting balance - Comments: Sitting EOB to doff surgical shoe and prosthesis.  Standing balance support: Bilateral upper extremity supported, During functional activity, Reliant on assistive device for balance Standing balance-Leahy Scale: Good Standing balance comment: Patient able  to stand with b/l UE support and maintain pwb status with RW. no LOB during standing.                            Cognition Arousal/Alertness: Awake/alert Behavior During Therapy: WFL for tasks assessed/performed Overall Cognitive Status: Within Functional Limits for tasks assessed                                          Exercises General Exercises - Lower Extremity Long Arc Quad: AROM, Left, 10 reps, Seated Hip Flexion/Marching: AROM, Both, 10 reps, Seated    General Comments        Pertinent Vitals/Pain Pain Assessment Pain Assessment: 0-10 Pain Score: 8  Pain Location: left calf Pain Descriptors / Indicators: Discomfort Pain Intervention(s): Limited activity within patient's tolerance, Monitored during session, Repositioned    Home Living                          Prior Function            PT Goals (current goals can now be found in the care plan section) Acute Rehab PT Goals Patient Stated Goal: to go home PT Goal Formulation: With patient Time For Goal Achievement: 06/08/23 Potential to Achieve Goals: Good Progress towards PT goals: Progressing toward goals    Frequency    Min 3X/week      PT Plan Current plan remains appropriate    Co-evaluation              AM-PAC PT "6 Clicks" Mobility   Outcome Measure  Help needed turning from your back to your side while in a flat bed without using bedrails?: None Help needed moving from lying on your back to sitting on the side of a flat bed without using bedrails?: None Help needed moving to and from a bed to a chair (including a wheelchair)?: A Little Help needed standing up from a chair using your arms (e.g., wheelchair or bedside chair)?: A Little Help needed to walk in hospital room?: A Little Help needed climbing 3-5 steps with a railing? : A Lot 6 Click Score: 19    End of Session Equipment Utilized During Treatment: Gait belt Activity Tolerance: Patient  tolerated treatment well Patient left: in chair;with call bell/phone within reach;with chair alarm set Nurse Communication: Mobility status PT Visit Diagnosis: Unsteadiness on feet (R26.81);Other abnormalities of gait and mobility (R26.89);Difficulty in walking, not elsewhere classified (R26.2);History of falling (Z91.81)     Time: 4540-9811 PT Time Calculation (min) (ACUTE ONLY): 16 min  Charges:                        Malachi Carl, SPT    Malachi Carl 05/30/2023, 1:43 PM

## 2023-05-30 NOTE — Progress Notes (Signed)
Progress Note    Jordan Arellano  IHK:[id number] DOB: Nov 14, 1951  DOA: 05/18/2023 PCP: Mort Sawyers, FNP      Brief Narrative:    Medical records reviewed and are as summarized below:  Jordan Arellano is a 72 y.o. male hypertension, hyperlipidemia, depression, TIA, stroke, PAD, presented to the hospital with left foot pain associated with redness and swelling.  He was found to have osteomyelitis of the left first and third toes.  He was treated with empiric IV antibiotics.  He underwent stent placement to right common iliac artery and right external iliac artery on 05/20/2023.  He also underwent amputation of left great toe and amputation of left third toe.  He developed persistent hypotension which did not respond to IV fluids.  This necessitated transfer to the ICU for vasopressors for septic shock.        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) Active Problems:   AKI (acute kidney injury) (HCC)   Essential hypertension   Dyslipidemia   Depression   History of CVA (cerebrovascular accident) without residual deficits   Tobacco abuse   PAD s/p right BKA, history revascularization left leg   Hypertriglyceridemia   Status post femoral-popliteal bypass surgery   Left renal artery stenosis (HCC)   Recurrent major depressive disorder, in full remission (HCC)   Heart failure with preserved ejection fraction (HCC)   Encounter for tobacco use cessation counseling   At risk for constipation   Acute hematogenous osteomyelitis of left foot (HCC)   Protein-calorie malnutrition, severe   Nutrition Problem: Severe Malnutrition Etiology: social / environmental circumstances  Signs/Symptoms: mild fat depletion, moderate fat depletion, moderate muscle depletion, severe muscle depletion, percent weight loss Percent weight loss: 8.4 %   Body mass index is 18.73 kg/m.   Septic shock secondary to acute  osteomyelitis of left first and third toes,? septic arthritis left great toe IP joint: Sepsis physiology has resolved. Left foot culture wound showed Staphylococcus lugdunensis.  No growth on blood culture.  Continue doxycycline through 06/01/2023.  Outpatient follow-up with Dr. Alberteen Spindle, podiatrist. Pathology report: Left great toe and third toe with soft tissue necrosis and acute osteomyelitis. Viable soft tissue and articular surface of bone at resection margins negative for acute osteomyelitis.   PAD, ischemic limb of left lower extremity: S/p stent placement to left tibioperoneal trunk and popliteal artery, stent placement to left SFA and popliteal arteries on 05/26/2023.  Follow-up with vascular surgeon.  S/p stent placement to right common iliac artery and right external iliac artery on 05/20/2023 History of stroke Continue rosuvastatin, aspirin and Plavix   AKI: Creatinine is improving.  Discontinue IV fluids.  Recent exposure to contrast from lower extremity angiogram on 05/26/2023.     Anemia of chronic disease: H&H is stable.  Hemoglobin is 8.1.  No indication for blood transfusion at this time.  S/p transfusion of 1 unit of PRBCs on 05/27/2023.   Hypokalemia: Improved   Type II DM: NovoLog as needed for hyperglycemia.   Other comorbidities include hypertension, hyperlipidemia   Patient wanted to go home today.  He refuses to go to SNF.  He insisted on going home rather than go to SNF.  Plan was to discharge him home today.  Discharge plan was discussed with [name], sister, over the phone.  It was explained that patient could not be forced to go to SNF if he prefers to go home.  She  was concerned about patient going home because she said patient did not have much support at home.  I advised her to try and convince patient to go to SNF.  However, if this was unsuccessful then she will need to get additional caregiver support to assist patient at home.  Eventually, she was able to  convince patient to go to SNF.  Patient has agreed to go to SNF so social worker will work on placement.     Diet Order             Diet - low sodium heart healthy           Diet Heart Room service appropriate? Yes; Fluid consistency: Thin  Diet effective now                            Consultants: Vascular surgeon Intensivist Podiatrist  Procedures: Amputation left great toe at the IPJ, amputation left third toe metatarsophalangeal joint on 05/21/2023 Stent placement to right common iliac artery and right external iliac artery on 05/20/2023   Medications:    vitamin C  500 mg Oral BID   aspirin EC  81 mg Oral Daily   Chlorhexidine Gluconate Cloth  6 each Topical Daily   clopidogrel  75 mg Oral Daily   cyanocobalamin  1,000 mcg Oral Daily   doxycycline  100 mg Oral Q12H   feeding supplement  237 mL Oral TID BM   folic acid  1 mg Oral Daily   insulin aspart  0-15 Units Subcutaneous TID WC   insulin aspart  0-5 Units Subcutaneous QHS   latanoprost  1 drop Both Eyes QHS   multivitamin with minerals  1 tablet Oral Daily   polyethylene glycol  17 g Oral Daily   rosuvastatin  40 mg Oral QHS   sertraline  100 mg Oral Daily   zinc sulfate  220 mg Oral Daily   Continuous Infusions:  sodium chloride Stopped (05/21/23 0815)     Anti-infectives (From admission, onward)    Start     Dose/Rate Route Frequency Ordered Stop   05/30/23 0000  doxycycline (VIBRA-TABS) 100 MG tablet        100 mg Oral Every 12 hours 05/30/23 1030 06/01/23 2359   05/26/23 2200  doxycycline (VIBRA-TABS) tablet 100 mg        100 mg Oral Every 12 hours 05/26/23 1047 06/02/23 2159   05/26/23 1412  ceFAZolin (ANCEF) IVPB 2g/100 mL premix        2 g 200 mL/hr over 30 Minutes Intravenous 30 min pre-op 05/26/23 1412 05/26/23 1754   05/22/23 0830  vancomycin (VANCOCIN) IVPB 1000 mg/200 mL premix        1,000 mg 200 mL/hr over 60 Minutes Intravenous Every 24 hours 05/22/23 0734 05/26/23 1240    05/21/23 1032  vancomycin variable dose per unstable renal function (pharmacist dosing)  Status:  Discontinued         Does not apply See admin instructions 05/21/23 1032 05/22/23 0734   05/20/23 1400  ceFAZolin (ANCEF) IVPB 2g/100 mL premix        2 g 200 mL/hr over 30 Minutes Intravenous 30 min pre-op 05/20/23 1400 05/20/23 1552   05/19/23 2000  vancomycin (VANCOCIN) IVPB 1000 mg/200 mL premix  Status:  Discontinued        1,000 mg 200 mL/hr over 60 Minutes Intravenous Every 24 hours 05/18/23 1827 05/19/23 1034   05/19/23 2000  vancomycin (VANCOREADY) IVPB 1250 mg/250 mL  Status:  Discontinued        1,250 mg 166.7 mL/hr over 90 Minutes Intravenous Every 24 hours 05/19/23 1034 05/21/23 1032   05/18/23 1900  vancomycin (VANCOREADY) IVPB 1500 mg/300 mL        1,500 mg 150 mL/hr over 120 Minutes Intravenous  Once 05/18/23 1827 05/18/23 2120   05/18/23 1830  ceFEPIme (MAXIPIME) 2 g in sodium chloride 0.9 % 100 mL IVPB  Status:  Discontinued        2 g 200 mL/hr over 30 Minutes Intravenous Every 12 hours 05/18/23 1827 05/24/23 1122   05/18/23 1400  cefTRIAXone (ROCEPHIN) 2 g in sodium chloride 0.9 % 100 mL IVPB        2 g 200 mL/hr over 30 Minutes Intravenous  Once 05/18/23 1347 05/18/23 1438              Family Communication/Anticipated D/C date and plan/Code Status   DVT prophylaxis: Place TED hose Start: 05/18/23 1434     Code Status: Full Code  Family Communication: None Disposition Plan: Plan to discharge home tomorrow   Status is: Inpatient Remains inpatient appropriate because: AKI       Subjective:   Interval events noted.  He has no complaints.  He wants to go home.  Objective:    Vitals:   05/29/23 1510 05/30/23 0019 05/30/23 0723 05/30/23 0755  BP: 123/63 (!) 119/56 122/70 119/62  Pulse: 73 73 63 72  Resp: 14 20 16 14   Temp: 98.9 F (37.2 C) 99.5 F (37.5 C) 98.2 F (36.8 C) 98.7 F (37.1 C)  TempSrc:   Oral   SpO2: 97% 97% 98% 96%   Weight:      Height:       No data found.   Intake/Output Summary (Last 24 hours) at 05/30/2023 1421 Last data filed at 05/30/2023 0900 Gross per 24 hour  Intake 0 ml  Output 1050 ml  Net -1050 ml   Filed Weights   05/19/23 2354 05/20/23 1444 05/26/23 1521  Weight: 64.4 kg 64.4 kg 64.4 kg    Exam:   GEN: NAD SKIN: Warm and dry EYES: No pallor or icterus ENT: MMM CV: RRR PULM: CTA B ABD: soft, ND, NT, +BS CNS: AAO x 3, non focal EXT: Right BKA.  Left foot dressings clean, dry and intact.     Data Reviewed:   I have personally reviewed following labs and imaging studies:  Labs: Labs show the following:   Basic Metabolic Panel: Recent Labs  Lab 05/24/23 0437 05/25/23 0433 05/26/23 0342 05/27/23 0417 05/28/23 0427 05/29/23 0430 05/30/23 0800  NA 133* 136 138 135 138 138 139  K 3.4* 3.8 3.5 3.4* 4.6 4.0 3.8  CL 105 109 108 106 109 104 109  CO2 20* 20* 22 24 21* 20* 21*  GLUCOSE 180* 126* 133* 208* 153* 135* 154*  BUN 19 17 18 18  24* 25* 28*  CREATININE 1.34* 1.24 1.22 1.37* 1.65* 1.55* 1.45*  CALCIUM 8.2* 8.0* 8.4* 7.7* 8.2* 7.8* 8.2*  MG 2.1 1.8  --  1.9  --   --   --    GFR Estimated Creatinine Clearance: 43.2 mL/min (A) (by C-G formula based on SCr of 1.45 mg/dL (H)). Liver Function Tests: No results for input(s): "AST", "ALT", "ALKPHOS", "BILITOT", "PROT", "ALBUMIN" in the last 168 hours.  No results for input(s): "LIPASE", "AMYLASE" in the last 168 hours. No results for input(s): "AMMONIA" in the last  168 hours. Coagulation profile No results for input(s): "INR", "PROTIME" in the last 168 hours.   CBC: Recent Labs  Lab 05/26/23 0342 05/27/23 0417 05/28/23 0427 05/29/23 0430 05/30/23 0800  WBC 10.9* 16.1* 14.2* 12.1* 11.7*  NEUTROABS 6.2  --  10.6* 8.8*  --   HGB 7.5* 7.3* 8.7* 8.0* 8.1*  HCT 23.0* 22.9* 27.3* 24.9* 25.0*  MCV 92.4 92.7 92.2 91.5 91.9  PLT 185 198 241 278 360   Cardiac Enzymes: No results for input(s): "CKTOTAL",  "CKMB", "CKMBINDEX", "TROPONINI" in the last 168 hours. BNP (last 3 results) No results for input(s): "PROBNP" in the last 8760 hours. CBG: Recent Labs  Lab 05/29/23 0758 05/29/23 1129 05/29/23 1642 05/29/23 2109 05/30/23 0756  GLUCAP 124* 143* 155* 188* 135*   D-Dimer: No results for input(s): "DDIMER" in the last 72 hours. Hgb A1c: No results for input(s): "HGBA1C" in the last 72 hours. Lipid Profile: No results for input(s): "CHOL", "HDL", "LDLCALC", "TRIG", "CHOLHDL", "LDLDIRECT" in the last 72 hours. Thyroid function studies: No results for input(s): "TSH", "T4TOTAL", "T3FREE", "THYROIDAB" in the last 72 hours.  Invalid input(s): "FREET3" Anemia work up: No results for input(s): "VITAMINB12", "FOLATE", "FERRITIN", "TIBC", "IRON", "RETICCTPCT" in the last 72 hours. Sepsis Labs: Recent Labs  Lab 05/27/23 0417 05/28/23 0427 05/29/23 0430 05/30/23 0800  WBC 16.1* 14.2* 12.1* 11.7*    Microbiology Recent Results (from the past 240 hour(s))  Culture, blood (Routine X 2) w Reflex to ID Panel     Status: None   Collection Time: 05/20/23 10:58 PM   Specimen: BLOOD  Result Value Ref Range Status   Specimen Description   Final    BLOOD BLOOD RIGHT ARM Performed at [facility], [address], [place], [place] [address]    Special Requests   Final    BOTTLES DRAWN AEROBIC AND ANAEROBIC Blood Culture adequate volume Performed at [facility], [address], [place], [place] [address]    Culture   Final    NO GROWTH 5 DAYS Performed at [facility], [address] [place], [place] [address]    Report Status 05/25/2023 FINAL  Final  Culture, blood (Routine X 2) w Reflex to ID Panel     Status: None   Collection Time: 05/20/23 11:53 PM   Specimen: BLOOD  Result Value Ref Range Status   Specimen Description BLOOD BLOOD LEFT ARM  Final   Special Requests   Final    BOTTLES DRAWN AEROBIC AND ANAEROBIC Blood Culture adequate volume   Culture    Final    NO GROWTH 5 DAYS Performed at [facility], [address], [place], [place] [address]    Report Status 05/26/2023 FINAL  Final  Aerobic/Anaerobic Culture w Gram Stain (surgical/deep wound)     Status: None   Collection Time: 05/21/23  9:13 AM   Specimen: Bone; Tissue  Result Value Ref Range Status   Specimen Description   Final    BONE Performed at [facility], [address], [place], [place] [address]    Special Requests   Final    NONE Performed at [facility], [address] [place], [place] [address]    Gram Stain   Final    MODERATE WBC PRESENT, PREDOMINANTLY PMN RARE GRAM POSITIVE COCCI RARE GRAM POSITIVE RODS CRITICAL RESULT CALLED TO, READ BACK BY AND VERIFIED WITH: RN Georgian Co [id number] AT 1457 BY EC    Culture   Final  FEW STAPHYLOCOCCUS LUGDUNENSIS RARE DIPHTHEROIDS(CORYNEBACTERIUM SPECIES) Standardized susceptibility testing for this organism is not available. NO ANAEROBES ISOLATED Performed at [facility], [address], [place], [place] [address]    Report Status 05/26/2023 FINAL  Final   Organism ID, Bacteria STAPHYLOCOCCUS LUGDUNENSIS  Final      Susceptibility   Staphylococcus lugdunensis - MIC*    CIPROFLOXACIN <=0.5 SENSITIVE Sensitive     ERYTHROMYCIN <=0.25 SENSITIVE Sensitive     GENTAMICIN <=0.5 SENSITIVE Sensitive     OXACILLIN >=4 RESISTANT Resistant     TETRACYCLINE <=1 SENSITIVE Sensitive     VANCOMYCIN <=0.5 SENSITIVE Sensitive     TRIMETH/SULFA <=10 SENSITIVE Sensitive     CLINDAMYCIN <=0.25 SENSITIVE Sensitive     RIFAMPIN <=0.5 SENSITIVE Sensitive     Inducible Clindamycin NEGATIVE Sensitive     * FEW STAPHYLOCOCCUS LUGDUNENSIS    Procedures and diagnostic studies:  No results found.             LOS: 12 days   Darcus Edds  [employer] loss adjuster on [email or web address]. If 7PM-7AM, please contact night-coverage at [email or web address]     05/30/2023, 2:21 PM

## 2023-05-31 ENCOUNTER — Telehealth: Payer: Self-pay | Admitting: Family

## 2023-05-31 LAB — BASIC METABOLIC PANEL
Anion gap: 8 (ref 5–15)
BUN: 28 mg/dL — ABNORMAL HIGH (ref 8–23)
CO2: 22 mmol/L (ref 22–32)
Calcium: 8.1 mg/dL — ABNORMAL LOW (ref 8.9–10.3)
Chloride: 109 mmol/L (ref 98–111)
Creatinine, Ser: 1.42 mg/dL — ABNORMAL HIGH (ref 0.61–1.24)
GFR, Estimated: 53 mL/min — ABNORMAL LOW (ref >60)
Glucose, Bld: 143 mg/dL — ABNORMAL HIGH (ref 70–99)
Potassium: 4.1 mmol/L (ref 3.5–5.1)
Sodium: 139 mmol/L (ref 135–145)

## 2023-05-31 LAB — GLUCOSE, CAPILLARY
Glucose-Capillary: 118 mg/dL — ABNORMAL HIGH (ref 70–99)
Glucose-Capillary: 133 mg/dL — ABNORMAL HIGH (ref 70–99)
Glucose-Capillary: 143 mg/dL — ABNORMAL HIGH (ref 70–99)
Glucose-Capillary: 117 mg/dL — ABNORMAL HIGH (ref 70–99)

## 2023-05-31 NOTE — Progress Notes (Signed)
Nutrition Follow-up  DOCUMENTATION CODES:   Severe malnutrition in context of social or environmental circumstances  INTERVENTION:   -Liberalize diet to regular -Continue MVI with minerals daily -Continue Ensure Enlive po TID, each supplement provides 350 kcal and 20 grams of protein.  -Continue 500 mg vitamin C BID -Continue 220 mg zinc sulfate daily x 14 days  NUTRITION DIAGNOSIS:   Severe Malnutrition related to social / environmental circumstances as evidenced by mild fat depletion, moderate fat depletion, moderate muscle depletion, severe muscle depletion, percent weight loss.  Ongoing  GOAL:   Patient will meet greater than or equal to 90% of their needs  Progressing  MONITOR:   PO intake, Supplement acceptance  REASON FOR ASSESSMENT:   Consult Assessment of nutrition requirement/status  ASSESSMENT:   Pt with history of hypertension, hyperlipidemia, depression, TIA, who presents for chief concerns of left foot pain and redness with swelling.  6/14- s/p lt lower extremity angiogram 6/15- s/p Amputation left great toe at the IPJ; Amputation left third toe metatarsophalangeal joint. 6/20- lt lower extremity angiography  Reviewed I/O's: +1.5 L since admission   Pt with erratic oral intake. Noted meal completions 0-75%. Pt also consuming outside food from family and drinking Ensure supplements.   Per psychiatry notes, pt with capacity to make medical decisions. SNF has been recommended, but pt unsure if he wants to go. Per TOC, pt to decide home with home health vs SNF.   Wt has been stable since admission.  Medications reviewed and include vitamin C, plavix, vitamin B-12, folic acid,miralax, and zinc sulfate.  Labs reviewed: CBGS: 118 (inpatient orders for glycemic control are 0-15 units insulin aspart TID with meals and 0-5 units insulin aspart daily at bedtime).    Diet Order:   Diet Order             Diet - low sodium heart healthy           Diet  Heart Room service appropriate? Yes; Fluid consistency: Thin  Diet effective now                   EDUCATION NEEDS:   Education needs have been addressed  Skin:  Skin Assessment: Skin Integrity Issues: Skin Integrity Issues:: Incisions Incisions: closed lt foot  Last BM:  05/30/23  Height:   Ht Readings from Last 1 Encounters:  05/26/23 6\' 1"  (1.854 m)    Weight:   Wt Readings from Last 1 Encounters:  05/26/23 64.4 kg    Ideal Body Weight:  78.2 kg (adjusted for rt BKA)  BMI:  Body mass index is 18.73 kg/m.  Estimated Nutritional Needs:   Kcal:  2050-2250  Protein:  115-130 grams  Fluid:  > 2 L    Levada Schilling, RD, LDN, CDCES Registered Dietitian II Certified Diabetes Care and Education Specialist Please refer to [facility] for RD and/or RD on-call/weekend/after hours pager

## 2023-05-31 NOTE — [place] FL2 (Signed)
[place] MEDICAID FL2 LEVEL OF CARE FORM     IDENTIFICATION  Patient Name: Jordan Arellano Birthdate: 1951/09/08 Sex: male Admission Date (Current Location): 05/18/2023  [facility] and [place] Number:  [address] [id number] [facility] Facility and Address:  [facility], [address] [place], [place] [address]      Provider Number: [id number]  Attending Physician Name and Address:  Lurene Shadow, MD  Relative Name and Phone Number:  Halford Chessman  [phone]    Current Level of Care: Hospital Recommended Level of Care: Skilled Nursing Facility Prior Approval Number:    Date Approved/Denied:   PASRR Number: [id number]  Discharge Plan: SNF    Current Diagnoses: Patient Active Problem List   Diagnosis Date Noted   Protein-calorie malnutrition, severe 05/24/2023   Acute hematogenous osteomyelitis of left foot (HCC) 05/19/2023   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) 05/18/2023   Heart failure with preserved ejection fraction (HCC) 05/18/2023   Encounter for tobacco use cessation counseling 05/18/2023   At risk for constipation 05/18/2023   Mild cognitive impairment 04/25/2023   Malnutrition of mild degree (HCC) 04/18/2023   Dyslipidemia 03/27/2023   Depression 03/27/2023   Type 2 diabetes mellitus without complications (HCC) 03/27/2023   AKI (acute kidney injury) (HCC) 03/21/2023   At high risk for infection 01/19/2023   Conductive hearing loss of right ear 08/19/2022   Left renal artery stenosis (HCC) 06/27/2020   Recurrent major depressive disorder, in full remission (HCC) 06/27/2020   Atherosclerosis of native arteries of extremity with rest pain (HCC) 10/01/2017   Status post femoral-popliteal bypass surgery 09/30/2017   DM (diabetes mellitus) with peripheral vascular complication (HCC) 12/19/2016   Hypertriglyceridemia 10/01/2016   S/P unilateral BKA (below knee amputation), right (HCC) 09/30/2016   Neuropathy  09/30/2016   PAD s/p right BKA, history revascularization left leg 09/30/2016   Phantom pain after amputation of lower extremity (HCC) 09/30/2016   History of CVA (cerebrovascular accident) without residual deficits 03/25/2014   Essential hypertension 03/25/2014   Tobacco abuse 03/25/2014    Orientation RESPIRATION BLADDER Height & Weight     Self, Time, Situation, Place  Normal Continent Weight: 64.4 kg Height:  6\' 1"  (185.4 cm)  BEHAVIORAL SYMPTOMS/MOOD NEUROLOGICAL BOWEL NUTRITION STATUS      Continent  (See Discharge Summary)  AMBULATORY STATUS COMMUNICATION OF NEEDS Skin   Extensive Assist (Right BKA) Verbally Surgical wounds                       Personal Care Assistance Level of Assistance  Bathing, Feeding, Dressing Bathing Assistance: Limited assistance Feeding assistance: Limited assistance Dressing Assistance: Maximum assistance     Functional Limitations Info  Sight, Hearing, Speech Sight Info: Adequate Hearing Info: Adequate Speech Info: Adequate    SPECIAL CARE FACTORS FREQUENCY  PT (By licensed PT), OT (By licensed OT)     PT Frequency: 5x weekly OT Frequency: 5x weekly            Contractures Contractures Info: Not present    Additional Factors Info  Code Status, Allergies Code Status Info: Full Code Allergies Info: No Known Allergies           Current Medications (05/31/2023):  This is the current hospital active medication list Current Facility-Administered Medications  Medication Dose Route Frequency Provider Last Rate Last Admin   0.9 %  sodium chloride infusion  250 mL Intravenous Continuous Wyn Quaker Marlow Baars, MD   Stopped at 05/21/23  4098   acetaminophen (TYLENOL) tablet 650 mg  650 mg Oral Q6H PRN Annice Needy, MD       ascorbic acid (VITAMIN C) tablet 500 mg  500 mg Oral BID Annice Needy, MD   500 mg at 05/31/23 0931   aspirin EC tablet 81 mg  81 mg Oral Daily Annice Needy, MD   81 mg at 05/31/23 0931   bisacodyl (DULCOLAX) EC tablet  10 mg  10 mg Oral Daily PRN Annice Needy, MD       Chlorhexidine Gluconate Cloth 2 % PADS 6 each  6 each Topical Daily Annice Needy, MD   6 each at 05/31/23 0933   clopidogrel (PLAVIX) tablet 75 mg  75 mg Oral Daily Annice Needy, MD   75 mg at 05/31/23 1191   cyanocobalamin (VITAMIN B12) tablet 1,000 mcg  1,000 mcg Oral Daily Annice Needy, MD   1,000 mcg at 05/31/23 0930   doxycycline (VIBRA-TABS) tablet 100 mg  100 mg Oral Q12H Annice Needy, MD   100 mg at 05/31/23 0931   feeding supplement (ENSURE ENLIVE / ENSURE PLUS) liquid 237 mL  237 mL Oral TID BM Annice Needy, MD   237 mL at 05/31/23 1411   folic acid (FOLVITE) tablet 1 mg  1 mg Oral Daily Annice Needy, MD   1 mg at 05/31/23 0931   insulin aspart (novoLOG) injection 0-15 Units  0-15 Units Subcutaneous TID WC Annice Needy, MD   2 Units at 05/31/23 1330   insulin aspart (novoLOG) injection 0-5 Units  0-5 Units Subcutaneous QHS Dew, Marlow Baars, MD       latanoprost (XALATAN) 0.005 % ophthalmic solution 1 drop  1 drop Both Eyes QHS Annice Needy, MD   1 drop at 05/30/23 2210   multivitamin with minerals tablet 1 tablet  1 tablet Oral Daily Annice Needy, MD   1 tablet at 05/31/23 0931   nicotine (NICODERM CQ - dosed in mg/24 hours) patch 14 mg  14 mg Transdermal Daily PRN Annice Needy, MD       Oral care mouth rinse  15 mL Mouth Rinse PRN Wyn Quaker, Marlow Baars, MD       oxyCODONE-acetaminophen (PERCOCET/ROXICET) 5-325 MG per tablet 1-2 tablet  1-2 tablet Oral Q4H PRN Annice Needy, MD   2 tablet at 05/31/23 0024   polyethylene glycol (MIRALAX / GLYCOLAX) packet 17 g  17 g Oral Daily Annice Needy, MD   17 g at 05/31/23 4782   rosuvastatin (CRESTOR) tablet 40 mg  40 mg Oral QHS Annice Needy, MD   40 mg at 05/30/23 2209   senna-docusate (Senokot-S) tablet 1 tablet  1 tablet Oral QHS PRN Annice Needy, MD       sertraline (ZOLOFT) tablet 100 mg  100 mg Oral Daily Annice Needy, MD   100 mg at 05/31/23 0930   zinc sulfate capsule 220 mg  220 mg Oral Daily Annice Needy, MD   220 mg at 05/31/23 9562     Discharge Medications: Please see discharge summary for a list of discharge medications.  Relevant Imaging Results:  Relevant Lab Results:   Additional Information [id number]  Garret Reddish, RN

## 2023-05-31 NOTE — Telephone Encounter (Signed)
Patient's sister Halford Chessman contacted the office asking specifically for office manager. Informed her that I was not sure she was available and I could leave a message for a call back. Patient's sister agreed and stated she could be reached at [phone].

## 2023-05-31 NOTE — Progress Notes (Signed)
Patient seen and evaluated today.  The left foot dressing was changed by myself.  The amputation sites are healing nicely.  Unfortunately does have a pressure induced lesion on the dorsal aspect of his left foot likely from the strap from the postoperative shoe.  I applied a padded foam border dressing to the area.  A padded foam border has also been added to the anterior aspect of his lower leg.  Recommend he continue with this.  He has a follow-up with me in the office on Thursday.  From podiatry standpoint okay for discharge.

## 2023-05-31 NOTE — Progress Notes (Signed)
Progress Note    Jordan Arellano  YQI:[id number] DOB: 07-02-1951  DOA: 05/18/2023 PCP: Mort Sawyers, FNP      Brief Narrative:    Medical records reviewed and are as summarized below:  Jordan Arellano is a 72 y.o. male hypertension, hyperlipidemia, depression, TIA, stroke, PAD, presented to the hospital with left foot pain associated with redness and swelling.  He was found to have osteomyelitis of the left first and third toes.  He was treated with empiric IV antibiotics.  He underwent stent placement to right common iliac artery and right external iliac artery on 05/20/2023.  He also underwent amputation of left great toe and amputation of left third toe.  He developed persistent hypotension which did not respond to IV fluids.  This necessitated transfer to the ICU for vasopressors for septic shock.        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) Active Problems:   AKI (acute kidney injury) (HCC)   Essential hypertension   Dyslipidemia   Depression   History of CVA (cerebrovascular accident) without residual deficits   Tobacco abuse   PAD s/p right BKA, history revascularization left leg   Hypertriglyceridemia   Status post femoral-popliteal bypass surgery   Left renal artery stenosis (HCC)   Recurrent major depressive disorder, in full remission (HCC)   Heart failure with preserved ejection fraction (HCC)   Encounter for tobacco use cessation counseling   At risk for constipation   Acute hematogenous osteomyelitis of left foot (HCC)   Protein-calorie malnutrition, severe   Nutrition Problem: Severe Malnutrition Etiology: social / environmental circumstances  Signs/Symptoms: mild fat depletion, moderate fat depletion, moderate muscle depletion, severe muscle depletion, percent weight loss Percent weight loss: 8.4 %   Body mass index is 18.73 kg/m.   Septic shock secondary to acute  osteomyelitis of left first and third toes,? septic arthritis left great toe IP joint: Sepsis physiology has resolved. Left foot culture wound showed Staphylococcus lugdunensis.  No growth on blood culture.  Plan to complete 1 week off of doxycycline tomorrow.  Outpatient follow-up with Dr. Alberteen Spindle, podiatrist. Pathology report: Left great toe and third toe with soft tissue necrosis and acute osteomyelitis. Viable soft tissue and articular surface of bone at resection margins negative for acute osteomyelitis.   PAD, ischemic limb of left lower extremity: S/p stent placement to left tibioperoneal trunk and popliteal artery, stent placement to left SFA and popliteal arteries on 05/26/2023.  Follow-up with vascular surgeon.  S/p stent placement to right common iliac artery and right external iliac artery on 05/20/2023 History of stroke Continue aspirin, Plavix and rosuvastatin   AKI: Creatinine is improved..  Monitor off of IV fluids.  Recent exposure to contrast from lower extremity angiogram on 05/26/2023.     Anemia of chronic disease: H&H is stable.  Hemoglobin is 8.1.  Repeat CBC tomorrow.  No indication for blood transfusion at this time.  S/p transfusion of 1 unit of PRBCs on 05/27/2023.   Hypokalemia: Improved   Type II DM: NovoLog as needed for hyperglycemia.   Other comorbidities include hypertension, hyperlipidemia   Patient is medically stable for discharge.  Awaiting placement to SNF.   Diet Order             Diet - low sodium heart healthy           Diet Heart Room service appropriate? Yes; Fluid consistency: Thin  Diet  effective now                            Consultants: Vascular surgeon Intensivist Podiatrist  Procedures: Amputation left great toe at the IPJ, amputation left third toe metatarsophalangeal joint on 05/21/2023 Stent placement to right common iliac artery and right external iliac artery on 05/20/2023   Medications:    vitamin C  500  mg Oral BID   aspirin EC  81 mg Oral Daily   Chlorhexidine Gluconate Cloth  6 each Topical Daily   clopidogrel  75 mg Oral Daily   cyanocobalamin  1,000 mcg Oral Daily   doxycycline  100 mg Oral Q12H   feeding supplement  237 mL Oral TID BM   folic acid  1 mg Oral Daily   insulin aspart  0-15 Units Subcutaneous TID WC   insulin aspart  0-5 Units Subcutaneous QHS   latanoprost  1 drop Both Eyes QHS   multivitamin with minerals  1 tablet Oral Daily   polyethylene glycol  17 g Oral Daily   rosuvastatin  40 mg Oral QHS   sertraline  100 mg Oral Daily   zinc sulfate  220 mg Oral Daily   Continuous Infusions:  sodium chloride Stopped (05/21/23 0815)     Anti-infectives (From admission, onward)    Start     Dose/Rate Route Frequency Ordered Stop   05/30/23 0000  doxycycline (VIBRA-TABS) 100 MG tablet        100 mg Oral Every 12 hours 05/30/23 1030 06/01/23 2359   05/26/23 2200  doxycycline (VIBRA-TABS) tablet 100 mg        100 mg Oral Every 12 hours 05/26/23 1047 06/02/23 2159   05/26/23 1412  ceFAZolin (ANCEF) IVPB 2g/100 mL premix        2 g 200 mL/hr over 30 Minutes Intravenous 30 min pre-op 05/26/23 1412 05/26/23 1754   05/22/23 0830  vancomycin (VANCOCIN) IVPB 1000 mg/200 mL premix        1,000 mg 200 mL/hr over 60 Minutes Intravenous Every 24 hours 05/22/23 0734 05/26/23 1240   05/21/23 1032  vancomycin variable dose per unstable renal function (pharmacist dosing)  Status:  Discontinued         Does not apply See admin instructions 05/21/23 1032 05/22/23 0734   05/20/23 1400  ceFAZolin (ANCEF) IVPB 2g/100 mL premix        2 g 200 mL/hr over 30 Minutes Intravenous 30 min pre-op 05/20/23 1400 05/20/23 1552   05/19/23 2000  vancomycin (VANCOCIN) IVPB 1000 mg/200 mL premix  Status:  Discontinued        1,000 mg 200 mL/hr over 60 Minutes Intravenous Every 24 hours 05/18/23 1827 05/19/23 1034   05/19/23 2000  vancomycin (VANCOREADY) IVPB 1250 mg/250 mL  Status:  Discontinued         1,250 mg 166.7 mL/hr over 90 Minutes Intravenous Every 24 hours 05/19/23 1034 05/21/23 1032   05/18/23 1900  vancomycin (VANCOREADY) IVPB 1500 mg/300 mL        1,500 mg 150 mL/hr over 120 Minutes Intravenous  Once 05/18/23 1827 05/18/23 2120   05/18/23 1830  ceFEPIme (MAXIPIME) 2 g in sodium chloride 0.9 % 100 mL IVPB  Status:  Discontinued        2 g 200 mL/hr over 30 Minutes Intravenous Every 12 hours 05/18/23 1827 05/24/23 1122   05/18/23 1400  cefTRIAXone (ROCEPHIN) 2 g in sodium chloride 0.9 % 100  mL IVPB        2 g 200 mL/hr over 30 Minutes Intravenous  Once 05/18/23 1347 05/18/23 1438              Family Communication/Anticipated D/C date and plan/Code Status   DVT prophylaxis: Place TED hose Start: 05/18/23 1434     Code Status: Full Code  Family Communication: None Disposition Plan: Plan to discharge to SNF.   Status is: Inpatient Remains inpatient appropriate because: Awaiting placement to SNF       Subjective:   No acute events overnight.  He has no complaints.  Pain in the left foot is better.  Objective:    Vitals:   05/30/23 0755 05/30/23 1528 05/30/23 2359 05/31/23 0829  BP: 119/62 (!) 117/57 (!) 121/59 119/60  Pulse: 72 66 73 66  Resp: 14 14 18 16   Temp: 98.7 F (37.1 C) 98.2 F (36.8 C) 97.8 F (36.6 C) 98.2 F (36.8 C)  TempSrc:      SpO2: 96% 99% 97% 98%  Weight:      Height:       No data found.   Intake/Output Summary (Last 24 hours) at 05/31/2023 1028 Last data filed at 05/31/2023 9147 Gross per 24 hour  Intake --  Output 900 ml  Net -900 ml   Filed Weights   05/19/23 2354 05/20/23 1444 05/26/23 1521  Weight: 64.4 kg 64.4 kg 64.4 kg    Exam:  GEN: NAD SKIN: Warm and dry EYES: EOMI ENT: MMM CV: RRR PULM: CTA B ABD: soft, ND, NT, +BS CNS: AAO x 3, non focal EXT: Dressing on left foot wound is clean, dry and intact.  Right BKA.    Data Reviewed:   I have personally reviewed following labs and imaging  studies:  Labs: Labs show the following:   Basic Metabolic Panel: Recent Labs  Lab 05/25/23 0433 05/26/23 0342 05/27/23 0417 05/28/23 0427 05/29/23 0430 05/30/23 0800 05/31/23 0420  NA 136   < > 135 138 138 139 139  K 3.8   < > 3.4* 4.6 4.0 3.8 4.1  CL 109   < > 106 109 104 109 109  CO2 20*   < > 24 21* 20* 21* 22  GLUCOSE 126*   < > 208* 153* 135* 154* 143*  BUN 17   < > 18 24* 25* 28* 28*  CREATININE 1.24   < > 1.37* 1.65* 1.55* 1.45* 1.42*  CALCIUM 8.0*   < > 7.7* 8.2* 7.8* 8.2* 8.1*  MG 1.8  --  1.9  --   --   --   --    < > = values in this interval not displayed.   GFR Estimated Creatinine Clearance: 44.1 mL/min (A) (by C-G formula based on SCr of 1.42 mg/dL (H)). Liver Function Tests: No results for input(s): "AST", "ALT", "ALKPHOS", "BILITOT", "PROT", "ALBUMIN" in the last 168 hours.  No results for input(s): "LIPASE", "AMYLASE" in the last 168 hours. No results for input(s): "AMMONIA" in the last 168 hours. Coagulation profile No results for input(s): "INR", "PROTIME" in the last 168 hours.   CBC: Recent Labs  Lab 05/26/23 0342 05/27/23 0417 05/28/23 0427 05/29/23 0430 05/30/23 0800  WBC 10.9* 16.1* 14.2* 12.1* 11.7*  NEUTROABS 6.2  --  10.6* 8.8*  --   HGB 7.5* 7.3* 8.7* 8.0* 8.1*  HCT 23.0* 22.9* 27.3* 24.9* 25.0*  MCV 92.4 92.7 92.2 91.5 91.9  PLT 185 198 241 278 360   Cardiac  Enzymes: No results for input(s): "CKTOTAL", "CKMB", "CKMBINDEX", "TROPONINI" in the last 168 hours. BNP (last 3 results) No results for input(s): "PROBNP" in the last 8760 hours. CBG: Recent Labs  Lab 05/29/23 2109 05/30/23 0756 05/30/23 1609 05/30/23 2154 05/31/23 0830  GLUCAP 188* 135* 213* 127* 118*   D-Dimer: No results for input(s): "DDIMER" in the last 72 hours. Hgb A1c: No results for input(s): "HGBA1C" in the last 72 hours. Lipid Profile: No results for input(s): "CHOL", "HDL", "LDLCALC", "TRIG", "CHOLHDL", "LDLDIRECT" in the last 72 hours. Thyroid  function studies: No results for input(s): "TSH", "T4TOTAL", "T3FREE", "THYROIDAB" in the last 72 hours.  Invalid input(s): "FREET3" Anemia work up: No results for input(s): "VITAMINB12", "FOLATE", "FERRITIN", "TIBC", "IRON", "RETICCTPCT" in the last 72 hours. Sepsis Labs: Recent Labs  Lab 05/27/23 0417 05/28/23 0427 05/29/23 0430 05/30/23 0800  WBC 16.1* 14.2* 12.1* 11.7*    Microbiology No results found for this or any previous visit (from the past 240 hour(s)).   Procedures and diagnostic studies:  No results found.             LOS: 13 days   Markeith Jue  [employer] loss adjuster on [email or web address]. If 7PM-7AM, please contact night-coverage at [email or web address]     05/31/2023, 10:28 AM

## 2023-06-01 DIAGNOSIS — Z8673 Personal history of transient ischemic attack (TIA), and cerebral infarction without residual deficits: Secondary | ICD-10-CM

## 2023-06-01 DIAGNOSIS — I10 Essential (primary) hypertension: Secondary | ICD-10-CM

## 2023-06-01 LAB — BASIC METABOLIC PANEL
Anion gap: 6 (ref 5–15)
BUN: 30 mg/dL — ABNORMAL HIGH (ref 8–23)
CO2: 23 mmol/L (ref 22–32)
Calcium: 8.1 mg/dL — ABNORMAL LOW (ref 8.9–10.3)
Chloride: 106 mmol/L (ref 98–111)
Creatinine, Ser: 1.36 mg/dL — ABNORMAL HIGH (ref 0.61–1.24)
GFR, Estimated: 56 mL/min — ABNORMAL LOW (ref >60)
Glucose, Bld: 134 mg/dL — ABNORMAL HIGH (ref 70–99)
Potassium: 4.2 mmol/L (ref 3.5–5.1)
Sodium: 135 mmol/L (ref 135–145)

## 2023-06-01 LAB — GLUCOSE, CAPILLARY
Glucose-Capillary: 134 mg/dL — ABNORMAL HIGH (ref 70–99)
Glucose-Capillary: 115 mg/dL — ABNORMAL HIGH (ref 70–99)
Glucose-Capillary: 174 mg/dL — ABNORMAL HIGH (ref 70–99)
Glucose-Capillary: 118 mg/dL — ABNORMAL HIGH (ref 70–99)

## 2023-06-01 LAB — CBC
HCT: 27.4 % — ABNORMAL LOW (ref 39.0–52.0)
Hemoglobin: 8.7 g/dL — ABNORMAL LOW (ref 13.0–17.0)
MCH: 29.6 pg (ref 26.0–34.0)
MCHC: 31.8 g/dL (ref 30.0–36.0)
MCV: 93.2 fL (ref 80.0–100.0)
Platelets: 478 10*3/uL — ABNORMAL HIGH (ref 150–400)
RBC: 2.94 MIL/uL — ABNORMAL LOW (ref 4.22–5.81)
RDW: 17.1 % — ABNORMAL HIGH (ref 11.5–15.5)
WBC: 12.1 10*3/uL — ABNORMAL HIGH (ref 4.0–10.5)
nRBC: 0 % (ref 0.0–0.2)

## 2023-06-01 NOTE — Plan of Care (Signed)

## 2023-06-01 NOTE — Progress Notes (Signed)
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 pmh of CVA, HTN, tobacco use, PAD, hypergliceridemia, left renal stenosis, DLD, depression and heart failure.   OT comments  Chart reviewed, pt greeted in bed agreeable to OT tx session. Tx session targeted improving functional mobility and activity tolerance in order to facilitate improved ADL completion. Pt unable to tolerate standing grooming tasks, therefore completed in sitting. Pt performed 10x STS with MIN A with good tolerance. Pt is making slow progress towards goals, will continue to benefit from skilled OT to address functional deficits.    Recommendations for follow up therapy are one component of a multi-disciplinary discharge planning process, led by the attending physician.  Recommendations may be updated based on patient status, additional functional criteria and insurance authorization.    Assistance Recommended at Discharge Intermittent Supervision/Assistance  Patient can return home with the following  A little help with walking and/or transfers;A little help with bathing/dressing/bathroom;Direct supervision/assist for medications management;Direct supervision/assist for financial management;Assistance with cooking/housework   Equipment Recommendations  BSC/3in1    Recommendations for Other Services      Precautions / Restrictions Precautions Precautions: Fall Precaution Comments: R old BKA Required Braces or Orthoses: Other Brace Restrictions Weight Bearing Restrictions: Yes LLE Weight Bearing: Partial weight bearing LLE Partial Weight Bearing Percentage or Pounds: 50 Other Position/Activity Restrictions: surgical shoe       Mobility Bed Mobility Overal bed  mobility: Modified Independent                  Transfers Overall transfer level: Needs assistance Equipment used: Rolling walker (2 wheels) Transfers: Sit to/from Stand (10x) Sit to Stand: Min assist, From elevated surface (intermittent vcs for technique)           General transfer comment: participated in STS practice in preparatoin for toilet/shower transfers to improve ADL independence     Balance Overall balance assessment: Needs assistance Sitting-balance support: Feet supported, No upper extremity supported Sitting balance-Leahy Scale: Normal     Standing balance support: Bilateral upper extremity supported, During functional activity, Reliant on assistive device for balance Standing balance-Leahy Scale: Fair                             ADL either performed or assessed with clinical judgement   ADL Overall ADL's : Needs assistance/impaired     Grooming: Wash/dry hands;Oral care;Sitting;Standing Grooming Details (indicate cue type and reason): at edge of bed, Standing with CGA, unable to tolerate so task graded down and pt completed in sitting with SET UP             Lower Body Dressing: Moderate assistance Lower Body Dressing Details (indicate cue type and reason): MOD A for post op shoe, pt performs RLE dressing including prosthetic with set up Toilet Transfer: Minimal assistance Toilet Transfer Details (indicate cue type and reason): simulated                Extremity/Trunk Assessment              Vision       Perception     Praxis      Cognition Arousal/Alertness: Awake/alert Behavior During Therapy: WFL for tasks assessed/performed Overall Cognitive Status: Within Functional Limits  for tasks assessed                                          Exercises      Shoulder Instructions       General Comments vss throughout    Pertinent Vitals/ Pain       Pain Assessment Pain Assessment: 0-10 Pain  Score: 4  Pain Location: left calf Pain Descriptors / Indicators: Discomfort Pain Intervention(s): Monitored during session, Repositioned, Limited activity within patient's tolerance  Home Living                                          Prior Functioning/Environment              Frequency  Min 1X/week        Progress Toward Goals  OT Goals(current goals can now be found in the care plan section)  Progress towards OT goals: Progressing toward goals     Plan Discharge plan remains appropriate    Co-evaluation                 AM-PAC OT "6 Clicks" Daily Activity     Outcome Measure   Help from another person eating meals?: None Help from another person taking care of personal grooming?: None Help from another person toileting, which includes using toliet, bedpan, or urinal?: A Little Help from another person bathing (including washing, rinsing, drying)?: A Little Help from another person to put on and taking off regular upper body clothing?: A Little Help from another person to put on and taking off regular lower body clothing?: A Little 6 Click Score: 20    End of Session Equipment Utilized During Treatment: Rolling walker (2 wheels)  OT Visit Diagnosis: Other abnormalities of gait and mobility (R26.89);Muscle weakness (generalized) (M62.81);Unsteadiness on feet (R26.81)   Activity Tolerance Patient tolerated treatment well   Patient Left in bed;with call bell/phone within reach;with bed alarm set   Nurse Communication Mobility status        Time: 1610-9604 OT Time Calculation (min): 21 min  Charges: OT General Charges $OT Visit: 1 Visit OT Treatments $Self Care/Home Management : 8-22 mins  Oleta Mouse, OTD OTR/L  06/01/23, 3:31 PM

## 2023-06-01 NOTE — Progress Notes (Signed)
  Progress Note   Patient: Jordan Arellano ZOX:[id number] DOB: 05-21-1951 DOA: 05/18/2023     14 DOS: the patient was seen and examined on 06/01/2023   Brief hospital course: Piers Baade is a 72 y.o. male hypertension, hyperlipidemia, depression, TIA, stroke, PAD, presented to the hospital with left foot pain associated with redness and swelling.  He was found to have osteomyelitis of the left first and third toes.  He was treated with empiric IV antibiotics.   He underwent stent placement to right common iliac artery and right external iliac artery on 05/20/2023.  He also underwent amputation of left great toe and amputation of left third toe.  He developed persistent hypotension which did not respond to IV fluids.  This necessitated transfer to the ICU for vasopressors for septic shock. Now he is stable for discharge.  Awaiting SNF placement.  Assessment and Plan: Septic shock secondary to acute osteomyelitis of left first and third toes,? septic arthritis left great toe IP joint: Sepsis physiology has resolved. Left foot culture wound showed Staphylococcus lugdunensis.  No growth on blood culture. Completed 1 week off of doxycycline.  Outpatient follow-up with Dr. Alberteen Spindle, podiatrist.  Pathology report: Left great toe and third toe with soft tissue necrosis and acute osteomyelitis. Viable soft tissue and articular surface of bone at resection margins negative for acute osteomyelitis.   PAD, ischemic limb of left lower extremity: S/p stent placement to left tibioperoneal trunk and popliteal artery, stent placement to left SFA and popliteal arteries on 05/26/2023.  Follow-up with vascular surgeon.  S/p stent placement to right common iliac artery and right external iliac artery on 05/20/2023. History of stroke- Continue aspirin, Plavix and rosuvastatin   AKI: Creatinine is improved..  Monitor off of IV fluids.  Recent exposure to contrast from lower extremity angiogram on 05/26/2023.     Anemia of  chronic disease: H&H is stable.  Hemoglobin is 8.1.  Repeat CBC tomorrow.  No indication for blood transfusion at this time.  S/p transfusion of 1 unit of PRBCs on 05/27/2023.   Hypokalemia: Improved   Type II DM: NovoLog as needed for hyperglycemia.   Other comorbidities include hypertension, hyperlipidemia     Subjective: Patient is seen and examined today morning. He is lying comfortably in bed, eating fair. Denies any complaints.   Physical Exam: Vitals:   05/31/23 0829 05/31/23 1537 05/31/23 2108 06/01/23 0848  BP: 119/60 125/61 113/73 123/69  Pulse: 66 72 86 71  Resp: 16 15 18 16   Temp: 98.2 F (36.8 C) 98 F (36.7 C) 97.9 F (36.6 C) (!) 97.5 F (36.4 C)  TempSrc:  Oral Oral   SpO2: 98% 97% 99% 98%  Weight:      Height:       General - Elderly Caucasian male, no apparent distress HEENT - PERRLA, EOMI, atraumatic head, non tender sinuses. Lung - Clear, rales, rhonchi, wheezes. Heart - S1, S2 heard, no murmurs, rubs, trace pedal edema Neuro - Alert, awake and oriented x x 3, non focal exam. Skin - Warm and dry.  Right BKA, left foot dressing Data Reviewed:  CBC, BMP  Family Communication: Patient is awaiting SNF placement.  Disposition: Status is: Inpatient Remains inpatient appropriate because: Safe dispo plan  Planned Discharge Destination: Skilled nursing facility    Time spent: 43 minutes  Author: Marcelino Duster, MD 06/01/2023 10:11 AM  For on call review [email or web address].

## 2023-06-01 NOTE — Progress Notes (Signed)
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 pmh of CVA, HTN, tobacco use, PAD, hypergliceridemia, left renal stenosis, DLD, depression and heart failure.    PT Comments    Inconsistent progress made between sessions. Decreased cues for safety required and pt appears to wax/wane depending on audience in room. Able to ambulate short distance in room with post op shoe donned and is agreeable to sit in recliner. Bed linens wet, bed stripped, no clean linen in room. Communicated to Engineer, manufacturing. Will continue to progress.  Recommendations for follow up therapy are one component of a multi-disciplinary discharge planning process, led by the attending physician.  Recommendations may be updated based on patient status, additional functional criteria and insurance authorization.  Follow Up Recommendations  Can patient physically be transported by private vehicle: Yes    Assistance Recommended at Discharge Intermittent Supervision/Assistance  Patient can return home with the following A little help with walking and/or transfers;Assistance with cooking/housework;Assist for transportation;Help with stairs or ramp for entrance   Equipment Recommendations  Rolling walker (2 wheels)    Recommendations for Other Services       Precautions / Restrictions Precautions Precautions: Fall Required Braces or Orthoses: Other Brace Other Brace: Forefoot off loading shoe Restrictions Weight Bearing Restrictions: Yes LLE Weight Bearing: Partial weight bearing LLE Partial Weight Bearing Percentage or Pounds: 50 Other Position/Activity Restrictions: surgical shoe needed to PWB on LLE.     Mobility  Bed Mobility Overal bed mobility: Independent              General bed mobility comments: safe technique with cues required for sequencing and initiating of movement.    Transfers Overall transfer level: Needs assistance Equipment used: Rolling walker (2 wheels) Transfers: Sit to/from Stand Sit to Stand: Min assist           General transfer comment: needs bed elevated for safe transfer. No cues required for hand placement. Once standing, min assist required for balance. Pt able to self don prosthetic leg and needed assistance for post op shoe.    Ambulation/Gait Ambulation/Gait assistance: Min guard Gait Distance (Feet): 20 Feet Assistive device: Rolling walker (2 wheels) Gait Pattern/deviations: Step-to pattern       General Gait Details: ambulated in room with step to gait pattern and heavy use of B UE on RW. Appears to be able to maintain WBing precautions. Educated on limited distance secondary to increased chance for wounds on L foot- noted increased padding related to strap on surgical off loading shoe. No LOB noted during turns   Social research officer, government Rankin (Stroke Patients Only)       Balance Overall balance assessment: Needs assistance Sitting-balance support: Feet supported, No upper extremity supported Sitting balance-Leahy Scale: Normal Sitting balance - Comments: Sitting EOB to doff surgical shoe and prosthesis.   Standing balance support: Bilateral upper extremity supported, During functional activity, Reliant on assistive device for balance Standing balance-Leahy Scale: Fair                              Cognition Arousal/Alertness: Awake/alert Behavior During Therapy: WFL for tasks assessed/performed Overall Cognitive Status: Within  Functional Limits for tasks assessed                                 General Comments: alert and appropriate, agreeable to session        Exercises Other Exercises Other Exercises: education given for  skin inspection    General Comments        Pertinent Vitals/Pain Pain Assessment Pain Assessment: No/denies pain    Home Living                          Prior Function            PT Goals (current goals can now be found in the care plan section) Acute Rehab PT Goals Patient Stated Goal: to go home PT Goal Formulation: With patient Time For Goal Achievement: 06/08/23 Potential to Achieve Goals: Good Progress towards PT goals: Progressing toward goals    Frequency    Min 2X/week      PT Plan Current plan remains appropriate;Frequency needs to be updated    Co-evaluation              AM-PAC PT "6 Clicks" Mobility   Outcome Measure  Help needed turning from your back to your side while in a flat bed without using bedrails?: None Help needed moving from lying on your back to sitting on the side of a flat bed without using bedrails?: None Help needed moving to and from a bed to a chair (including a wheelchair)?: A Little Help needed standing up from a chair using your arms (e.g., wheelchair or bedside chair)?: A Lot Help needed to walk in hospital room?: A Little Help needed climbing 3-5 steps with a railing? : A Lot 6 Click Score: 18    End of Session Equipment Utilized During Treatment: Gait belt Activity Tolerance: Patient tolerated treatment well Patient left: in chair;with chair alarm set Nurse Communication: Mobility status PT Visit Diagnosis: Unsteadiness on feet (R26.81);Other abnormalities of gait and mobility (R26.89);Difficulty in walking, not elsewhere classified (R26.2);History of falling (Z91.81)     Time: 0981-1914 PT Time Calculation (min) (ACUTE ONLY): 14 min  Charges:  $Gait Training: 8-22 mins                     Elizabeth Palau, PT, DPT, GCS [phone]    Arlis Yale 06/01/2023, 11:24 AM

## 2023-06-01 NOTE — TOC Progression Note (Signed)
Transition of Care [facility]) - Progression Note    Patient Details  Name: Jordan Arellano MRN: [id number] Date of Birth: 05-Jun-1951  [facility]) CM/SW Contact  Garret Reddish, RN Phone Number: 06/01/2023, 4:35 PM  Clinical Narrative:   Chart reviewed.  Spoke with [facility] and they can accept patient on Friday to [facility].  I have informed patient and his sister Jordan Arellano of the above information.  Jordan Arellano has requested that I speak with [facility] about the bed.  She reports that patient is 6 feet 6 and would need a bed that could accommodate his height. I have left a message for Tiffany in admissions at [facility].  TOC will start insurance authorization on tomorrow in preparation for dc on Friday.    TOC will continue to follow for discharge planning.      Expected Discharge Plan: Home w Home Health Services (Home with Home Health v/s SNF) Barriers to Discharge: No Barriers Identified  Expected Discharge Plan and Services   Discharge Planning Services: CM Consult Post Acute Care Choice: Home Health Living arrangements for the past 2 months: Single Family Home Expected Discharge Date: 05/30/23                                     Social Determinants of Health (SDOH) Interventions SDOH Screenings   Food Insecurity: No Food Insecurity (05/19/2023)  Housing: Low Risk  (05/19/2023)  Transportation Needs: No Transportation Needs (05/19/2023)  Utilities: Not At Risk (05/19/2023)  Alcohol Screen: Low Risk  (04/07/2022)  Depression (PHQ2-9): Low Risk  (04/07/2022)  Financial Resource Strain: Low Risk  (10/08/2021)  Physical Activity: Insufficiently Active (04/07/2022)  Social Connections: Socially Isolated (04/07/2022)  Stress: No Stress Concern Present (04/07/2022)  Tobacco Use: High Risk (05/27/2023)    Readmission Risk Interventions     No data to display

## 2023-06-02 LAB — GLUCOSE, CAPILLARY
Glucose-Capillary: 160 mg/dL — ABNORMAL HIGH (ref 70–99)
Glucose-Capillary: 223 mg/dL — ABNORMAL HIGH (ref 70–99)
Glucose-Capillary: 126 mg/dL — ABNORMAL HIGH (ref 70–99)
Glucose-Capillary: 193 mg/dL — ABNORMAL HIGH (ref 70–99)

## 2023-06-02 NOTE — TOC Progression Note (Signed)
Transition of Care [facility]) - Progression Note    Patient Details  Name: Jordan Arellano MRN: [id number] Date of Birth: 1951-07-11  Transition of Care [facility]) CM/SW Contact  Garret Reddish, RN Phone Number: 06/02/2023, 3:31 PM  Clinical Narrative:   [facility] staff member Jeanice Lim has submitted for SNF authorization.  SNF authorization pending at this time.  Pending SNF authorization number is [id number].  Patient's POA has informed me that she wanted to make sure that patient had a bed that was able to accommodate his height.  [name], POA has also requested for me to check with facility about having Ensure at the facility.  [name] has also asked me to inform provider to not order Mirilax for Mr. Decelles on discharge and that a stool softener would be ok. I have informed Dr. Clide Dales of the request.    I have spoken with Tiffany, Admissions Coordinator with [facility].  He informs me that the facility does have beds that can accommodate the patient's height.  Tiffany also informs me that the facility also has Ensure.   TOC will continue to follow for discharge planning.     Expected Discharge Plan: Home w Home Health Services (Home with Home Health v/s SNF) Barriers to Discharge: No Barriers Identified  Expected Discharge Plan and Services   Discharge Planning Services: CM Consult Post Acute Care Choice: Home Health Living arrangements for the past 2 months: Single Family Home Expected Discharge Date: 05/30/23                                     Social Determinants of Health (SDOH) Interventions SDOH Screenings   Food Insecurity: No Food Insecurity (05/19/2023)  Housing: Low Risk  (05/19/2023)  Transportation Needs: No Transportation Needs (05/19/2023)  Utilities: Not At Risk (05/19/2023)  Alcohol Screen: Low Risk  (04/07/2022)  Depression (PHQ2-9): Low Risk  (04/07/2022)  Financial Resource Strain: Low Risk  (10/08/2021)  Physical Activity: Insufficiently Active (04/07/2022)  Social  Connections: Socially Isolated (04/07/2022)  Stress: No Stress Concern Present (04/07/2022)  Tobacco Use: High Risk (05/27/2023)    Readmission Risk Interventions     No data to display

## 2023-06-02 NOTE — Progress Notes (Signed)
  Progress Note   Patient: Jordan Arellano NWG:[id number] DOB: 1951/07/13 DOA: 05/18/2023     15 DOS: the patient was seen and examined on 06/02/2023   Brief hospital course: Jordan Arellano is a 72 y.o. male hypertension, hyperlipidemia, depression, TIA, stroke, PAD, presented to the hospital with left foot pain associated with redness and swelling.  He was found to have osteomyelitis of the left first and third toes.  He was treated with empiric IV antibiotics.   He underwent stent placement to right common iliac artery and right external iliac artery on 05/20/2023.  He also underwent amputation of left great toe and amputation of left third toe.  He developed persistent hypotension which did not respond to IV fluids.  This necessitated transfer to the ICU for vasopressors for septic shock. Now he is stable for discharge.  Awaiting SNF placement.  Assessment and Plan: Septic shock secondary to acute osteomyelitis of left first and third toes,? septic arthritis left great toe IP joint: Sepsis physiology has resolved. Left foot culture wound showed Staphylococcus lugdunensis.  No growth on blood culture. Completed 1 week off of doxycycline.  Outpatient follow-up with Dr. Alberteen Spindle, podiatrist.  Pathology report: Left great toe and third toe with soft tissue necrosis and acute osteomyelitis. Viable soft tissue and articular surface of bone at resection margins negative for acute osteomyelitis.   PAD, ischemic limb of left lower extremity: S/p stent placement to left tibioperoneal trunk and popliteal artery, stent placement to left SFA and popliteal arteries on 05/26/2023.  Follow-up with vascular surgeon.  S/p stent placement to right common iliac artery and right external iliac artery on 05/20/2023. History of stroke- Continue aspirin, Plavix and rosuvastatin   AKI: Creatinine is improved..  Monitor off of IV fluids.  Recent exposure to contrast from lower extremity angiogram on 05/26/2023.     Anemia of  chronic disease: H&H is stable.  Hemoglobin is 8.1.  Repeat CBC tomorrow.  No indication for blood transfusion at this time.  S/p transfusion of 1 unit of PRBCs on 05/27/2023.   Hypokalemia: Improved   Type II DM: Continue accucheks, sliding scale insulin per protocol.   Hypertension- BP lower side, continue to monitor.  Hyperlipidemia- on statin therapy.     Subjective: Patient is seen and examined today morning. He is lying comfortably in bed. No overnight issues. Encouraged to work with PT, ocu o  Physical Exam: Vitals:   06/01/23 0848 06/01/23 1503 06/01/23 2115 06/02/23 0819  BP: 123/69 (!) 127/58 115/61 118/67  Pulse: 71 65 77 67  Resp: 16 17 18 16   Temp: (!) 97.5 F (36.4 C) 98.1 F (36.7 C) 98.9 F (37.2 C) 98.1 F (36.7 C)  TempSrc:  Oral    SpO2: 98% 99% 98% 98%  Weight:      Height:       General - Elderly Caucasian male, no apparent distress HEENT - PERRLA, EOMI, atraumatic head, non tender sinuses. Lung - Clear, rales, rhonchi, wheezes. Heart - S1, S2 heard, no murmurs, rubs, trace pedal edema Neuro - Alert, awake and oriented x x 3, non focal exam. Skin - Warm and dry.  Right BKA, left foot dressing Data Reviewed:  Blood sugars  Family Communication: Patient is awaiting SNF placement.  Disposition: Status is: Inpatient Remains inpatient appropriate because: Safe dispo plan  Planned Discharge Destination: Skilled nursing facility    Time spent: 40 minutes  Author: Marcelino Duster, MD 06/02/2023 3:07 PM  For on call review [email or web address].

## 2023-06-02 NOTE — Care Management Important Message (Signed)
Important Message  Patient Details  Name: Jordan Arellano MRN: [id number] Date of Birth: 1951-09-16   Medicare Important Message Given:  Yes     Olegario Messier A Preston Weill 06/02/2023, 2:27 PM

## 2023-06-03 DIAGNOSIS — E43 Unspecified severe protein-calorie malnutrition: Secondary | ICD-10-CM

## 2023-06-03 LAB — GLUCOSE, CAPILLARY
Glucose-Capillary: 152 mg/dL — ABNORMAL HIGH (ref 70–99)
Glucose-Capillary: 171 mg/dL — ABNORMAL HIGH (ref 70–99)

## 2023-06-03 MED ORDER — SENNOSIDES-DOCUSATE SODIUM 8.6-50 MG PO TABS: 1.0000 | ORAL_TABLET | Freq: Every evening | ORAL | 0 refills | Status: AC | PRN

## 2023-06-03 MED ORDER — ASCORBIC ACID 500 MG PO TABS: 500.0000 mg | ORAL_TABLET | Freq: Every day | ORAL | 2 refills | Status: AC

## 2023-06-03 MED ORDER — ADULT MULTIVITAMIN W/MINERALS CH: 1.0000 | ORAL_TABLET | Freq: Every day | ORAL | 3 refills | Status: AC

## 2023-06-03 MED ORDER — ENSURE PLUS HIGH PROTEIN PO LIQD: 1.0000 | Freq: Three times a day (TID) | ORAL | 12 refills | Status: DC

## 2023-06-03 MED ORDER — NICOTINE 14 MG/24HR TD PT24: 14.0000 mg | MEDICATED_PATCH | Freq: Every day | TRANSDERMAL | 0 refills | Status: DC | PRN

## 2023-06-03 MED ORDER — INSULIN ASPART 100 UNIT/ML IJ SOLN: 0.0000 [IU] | Freq: Three times a day (TID) | INTRAMUSCULAR | 2 refills | Status: DC

## 2023-06-03 NOTE — Plan of Care (Signed)
  Problem: Education: Goal: Ability to describe self-care measures that may prevent or decrease complications (Diabetes Survival Skills Education) will improve Outcome: Adequate for Discharge   Problem: Coping: Goal: Ability to adjust to condition or change in health will improve Outcome: Adequate for Discharge   Problem: Fluid Volume: Goal: Ability to maintain a balanced intake and output will improve Outcome: Adequate for Discharge   Problem: Health Behavior/Discharge Planning: Goal: Ability to identify and utilize available resources and services will improve Outcome: Adequate for Discharge Goal: Ability to manage health-related needs will improve Outcome: Adequate for Discharge   Problem: Metabolic: Goal: Ability to maintain appropriate glucose levels will improve Outcome: Adequate for Discharge   Problem: Nutritional: Goal: Maintenance of adequate nutrition will improve Outcome: Adequate for Discharge Goal: Progress toward achieving an optimal weight will improve Outcome: Adequate for Discharge   Problem: Skin Integrity: Goal: Risk for impaired skin integrity will decrease Outcome: Adequate for Discharge   Problem: Tissue Perfusion: Goal: Adequacy of tissue perfusion will improve Outcome: Adequate for Discharge   Problem: Education: Goal: Knowledge of General Education information will improve Description: Including pain rating scale, medication(s)/side effects and non-pharmacologic comfort measures Outcome: Adequate for Discharge   Problem: Health Behavior/Discharge Planning: Goal: Ability to manage health-related needs will improve Outcome: Adequate for Discharge   Problem: Clinical Measurements: Goal: Ability to maintain clinical measurements within normal limits will improve Outcome: Adequate for Discharge Goal: Will remain free from infection Outcome: Adequate for Discharge Goal: Diagnostic test results will improve Outcome: Adequate for Discharge Goal:  Respiratory complications will improve Outcome: Adequate for Discharge Goal: Cardiovascular complication will be avoided Outcome: Adequate for Discharge   Problem: Activity: Goal: Risk for activity intolerance will decrease Outcome: Adequate for Discharge   Problem: Nutrition: Goal: Adequate nutrition will be maintained Outcome: Adequate for Discharge   Problem: Coping: Goal: Level of anxiety will decrease Outcome: Adequate for Discharge   Problem: Elimination: Goal: Will not experience complications related to bowel motility Outcome: Adequate for Discharge Goal: Will not experience complications related to urinary retention Outcome: Adequate for Discharge   Problem: Pain Managment: Goal: General experience of comfort will improve Outcome: Adequate for Discharge   Problem: Safety: Goal: Ability to remain free from injury will improve Outcome: Adequate for Discharge   Problem: Skin Integrity: Goal: Risk for impaired skin integrity will decrease Outcome: Adequate for Discharge   Problem: Malnutrition  (NI-5.2) Goal: Food and/or nutrient delivery Description: Individualized approach for food/nutrient provision. Outcome: Adequate for Discharge   Problem: Acute Rehab PT Goals(only PT should resolve) Goal: Pt Will Ambulate Outcome: Adequate for Discharge Goal: Pt Will Go Up/Down Stairs Outcome: Adequate for Discharge   Problem: Acute Rehab OT Goals (only OT should resolve) Goal: Pt. Will Perform Lower Body Dressing Outcome: Adequate for Discharge Goal: Pt. Will Transfer To Toilet Outcome: Adequate for Discharge Goal: OT Additional ADL Goal #1 Outcome: Adequate for Discharge

## 2023-06-03 NOTE — Progress Notes (Signed)
Gave report to facility nurse at [facility]. Reviewed discharge instructions with patient and patient's sister. Patient and sister verbalized understanding. Patient discharged with personal belongings. Staff wheeled patient out. Patient transported to [facility] via family vehicle.

## 2023-06-03 NOTE — TOC Progression Note (Signed)
Transition of Care [facility]) - Progression Note    Patient Details  Name: Jordan Arellano MRN: [id number] Date of Birth: 1951-09-20  [facility] Care [facility]) CM/SW Contact  Liliana Cline, LCSW Phone Number: 06/03/2023, 10:06 AM  Clinical Narrative:    Per Elmarie Shiley at [facility], patient can come today to room 505 if medically ready. They would need the DC Summary by 12 noon. MD notified.    Expected Discharge Plan: Home w Home Health Services (Home with Home Health v/s SNF) Barriers to Discharge: No Barriers Identified  Expected Discharge Plan and Services   Discharge Planning Services: CM Consult Post Acute Care Choice: Home Health Living arrangements for the past 2 months: Single Family Home Expected Discharge Date: 05/30/23                                     Social Determinants of Health (SDOH) Interventions SDOH Screenings   Food Insecurity: No Food Insecurity (05/19/2023)  Housing: Low Risk  (05/19/2023)  Transportation Needs: No Transportation Needs (05/19/2023)  Utilities: Not At Risk (05/19/2023)  Alcohol Screen: Low Risk  (04/07/2022)  Depression (PHQ2-9): Low Risk  (04/07/2022)  Financial Resource Strain: Low Risk  (10/08/2021)  Physical Activity: Insufficiently Active (04/07/2022)  Social Connections: Socially Isolated (04/07/2022)  Stress: No Stress Concern Present (04/07/2022)  Tobacco Use: High Risk (05/27/2023)    Readmission Risk Interventions     No data to display

## 2023-06-03 NOTE — Discharge Summary (Addendum)
Physician Discharge Summary   Patient: Jordan Arellano MRN: [id number] DOB: Dec 11, 1950  Admit date:     05/18/2023  Discharge date: 06/03/23  Discharge Physician: Marcelino Duster   PCP: Mort Sawyers, FNP   Recommendations at discharge:    PCP 1 week, CBC, BMP check. 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) Active Problems:   AKI (acute kidney injury) (HCC)   Essential hypertension   Dyslipidemia   Depression   History of CVA (cerebrovascular accident) without residual deficits   Tobacco abuse   PAD s/p right BKA, history revascularization left leg   Hypertriglyceridemia   Status post femoral-popliteal bypass surgery   Left renal artery stenosis (HCC)   Recurrent major depressive disorder, in full remission (HCC)   Heart failure with preserved ejection fraction (HCC)   Encounter for tobacco use cessation counseling   At risk for constipation   Acute hematogenous osteomyelitis of left foot (HCC)   Protein-calorie malnutrition, severe  Resolved Problems:   * No resolved hospital problems. *  Hospital Course: [name] is a 72 y.o. male hypertension, hyperlipidemia, depression, TIA, stroke, PAD, presented to the hospital with left foot pain associated with redness and swelling. Patient was placed on broad-spectrum antibiotics for osteomyelitis. MRI of the foot showed osteomyelitis of the first and third toes. Patient had a significant ischemia, seen by vascular surgery, underwent stent placement to right common iliac artery and right external iliac artery on 05/20/2023. Podiatry performed amputation of left great toe and amputation of left third toe 05/21/23.  He developed persistent hypotension which did not respond to IV fluids necessitated transfer to the ICU for vasopressors for septic shock.  Patient was transferred to medical floor off pressors.  Left foot wound  culture staph lugdunensis, finished vancomycin and doxycycline therapy.  Pathology report from left great toe and third toe resection margins negative for acute osteomyelitis.  Patient is continued on statin, aspirin and Plavix.  He had drop in hemoglobin got 1 unit of transfusion 05/27/2023.  Patient's electrolytes closely monitored, supplemented accordingly.  Blood sugars monitored, insulin adjusted accordingly.  PT/ OT evaluated the patient recommended SNF.  Patient is hemodynamically stable to be discharged to skilled nursing facility.  I advised PCP follow-up upon discharge.  He understands and agrees with the discharge plan.   Assessment and Plan:     Consultants: Podiatry, Vascular Procedures performed:  Aortogram and selective left lower extremity angiogram. Stent placement to the right common iliac artery with 8 mm diameter by 6 cm length life star stent. Stent placement to the right external iliac artery with 6 mm diameter by 7.5 cm length Viabahn stent.  Amputation left great toe at the IPJ, left third toe metatarsophalangeal joint.  Disposition: Skilled nursing facility Diet recommendation:  Discharge Diet Orders (From admission, onward)     Start     Ordered   06/03/23 0000  Diet Carb Modified        06/03/23 1009   05/30/23 0000  Diet - low sodium heart healthy        05/30/23 1030           Cardiac and Carb modified diet DISCHARGE MEDICATION: Allergies as of 06/03/2023   No Known Allergies      Medication List     STOP taking these medications    bisacodyl 5 MG EC tablet Commonly known as: bisacodyl   folic acid 1  MG tablet Commonly known as: FOLVITE   HYDROcodone-acetaminophen 5-325 MG tablet Commonly known as: NORCO/VICODIN   metFORMIN 850 MG tablet Commonly known as: GLUCOPHAGE   olmesartan 40 MG tablet Commonly known as: BENICAR   polyethylene glycol 17 g packet Commonly known as: MiraLax   spironolactone 25 MG tablet Commonly known as:  ALDACTONE       TAKE these medications    acetaminophen 500 MG tablet Commonly known as: TYLENOL Take 500 mg by mouth every 6 (six) hours as needed.   amLODipine 10 MG tablet Commonly known as: NORVASC Take 10 mg by mouth daily.   ascorbic acid 500 MG tablet Commonly known as: VITAMIN C Take 1 tablet (500 mg total) by mouth daily.   aspirin 81 MG tablet Take 81 mg by mouth daily.   blood glucose meter kit and supplies Kit Dispense based on patient and insurance preference. Use two times daily as directed (before breakfast and at bedtime. (FOR ICD-10: E11.51)   clopidogrel 75 MG tablet Commonly known as: Plavix Take 1 tablet (75 mg total) by mouth daily.   cyanocobalamin 1000 MCG tablet Take 1 tablet (1,000 mcg total) by mouth daily.   Ensure Plus High Protein Liqd Take 237 mLs by mouth in the morning, at noon, and at bedtime.   ergocalciferol 1.25 MG (50000 UT) capsule Commonly known as: VITAMIN D2 Take 1 capsule by mouth once a week.   ferrous sulfate 325 (65 FE) MG tablet Take 1 tablet (325 mg total) by mouth daily with breakfast.   icosapent Ethyl 1 g capsule Commonly known as: Vascepa Take 2 capsules (2 g total) by mouth 2 (two) times daily.   insulin aspart 100 UNIT/ML injection Commonly known as: novoLOG Inject 0-15 Units into the skin 3 (three) times daily with meals. CBG < 70: Implement Hypoglycemia protocol CBG 70 - 120: 0 units CBG 121 - 150: 2 units CBG 151 - 200: 3 units CBG 201 - 250: 5 units CBG 251 - 300: 8 units CBG 301 - 350: 11 units CBG 351 - 400: 15 units CBG > 400: call MD and obtain STAT lab verification   latanoprost 0.005 % ophthalmic solution Commonly known as: XALATAN Place 1 drop into both eyes at bedtime.   multivitamin with minerals Tabs tablet Take 1 tablet by mouth daily. Start taking on: June 04, 2023   nicotine 14 mg/24hr patch Commonly known as: NICODERM CQ - dosed in mg/24 hours Place 1 patch (14 mg total) onto the  skin daily as needed (nicotine craving).   rosuvastatin 40 MG tablet Commonly known as: Crestor Take 1 tablet (40 mg total) by mouth daily.   senna-docusate 8.6-50 MG tablet Commonly known as: Senokot-S Take 1 tablet by mouth at bedtime as needed for mild constipation.   sertraline 100 MG tablet Commonly known as: ZOLOFT Take 1 tablet (100 mg total) by mouth daily.               Durable Medical Equipment  (From admission, onward)           Start     Ordered   05/26/23 1508  For home use only DME Bedside commode  Once       Question:  Patient needs a bedside commode to treat with the following condition  Answer:  Generalized weakness   05/26/23 1507   05/26/23 1507  For home use only DME Walker rolling  Once       Question Answer Comment  Walker:  With 5 Inch Wheels   Patient needs a walker to treat with the following condition Generalized weakness      05/26/23 1506            Follow-up Information     Linus Galas, DPM. Schedule an appointment as soon as possible for a visit in 1 week(s).   Specialty: Podiatry Contact information: [address] [place] [address] [phone]                Discharge Exam: Filed Weights   05/19/23 2354 05/20/23 1444 05/26/23 1521  Weight: 64.4 kg 64.4 kg 64.4 kg   General - Elderly Caucasian male, no apparent distress HEENT - PERRLA, EOMI, atraumatic head, non tender sinuses. Lung - Clear, rales, rhonchi, wheezes. Heart - S1, S2 heard, no murmurs, rubs, trace pedal edema Neuro - Alert, awake and oriented x x 3, non focal exam. Skin - Warm and dry.  Right BKA, left foot dressing  Condition at discharge: stable  The results of significant diagnostics from this hospitalization (including imaging, microbiology, ancillary and laboratory) are listed below for reference.   Imaging Studies: PERIPHERAL VASCULAR CATHETERIZATION  Result Date: 05/27/2023 See surgical note for result.  PERIPHERAL VASCULAR  CATHETERIZATION  Result Date: 05/20/2023 See surgical note for result.  MR FOOT LEFT W WO CONTRAST  Result Date: 05/19/2023 CLINICAL DATA:  Soft tissue infection suspected, foot, xray done osteomyelitis suspected EXAM: MRI OF THE LEFT FOREFOOT WITHOUT AND WITH CONTRAST TECHNIQUE: Multiplanar, multisequence MR imaging of the left forefoot was performed both before and after administration of intravenous contrast. CONTRAST:  6mL GADAVIST GADOBUTROL 1 MMOL/ML IV SOLN COMPARISON:  05/18/2023 FINDINGS: Bones/Joint/Cartilage Bone marrow edema and enhancement throughout the distal phalanx of the left great toe. Confluent low T1 marrow signal changes within the tuft of the great toe distal phalanx compatible with acute osteomyelitis. Trace joint fluid within the great toe IP joint, nonspecific. Preserved marrow signal within the proximal phalanx of the great toe. There is also bone marrow edema and enhancement within the distal phalanx of the third toe with intermediate-low T1 marrow signal. Preserved signal within the proximal middle phalanx of third toe. Remaining osseous structures of the forefoot demonstrate normal bone marrow signal. No additional sites of osteomyelitis. No acute fracture or dislocation. Hammertoe deformities of the second through fourth toes. Ligaments Intact Lisfranc ligament.  Intact collateral ligaments. Muscles and Tendons Chronic denervation changes of the foot musculature. Intact flexor and extensor tendons. No significant tenosynovial fluid collection. Soft tissues Small ulceration at the distal aspect of the great toe with mild soft tissue edema and enhancement. No organized or drainable fluid collections. IMPRESSION: 1. Small ulceration at the distal aspect of the great toe with acute osteomyelitis of the great toe distal phalanx. 2. Acute osteomyelitis of the distal phalanx of the third toe. 3. Trace joint fluid within the great toe IP joint, nonspecific and may be reactive. Septic  arthritis not excluded. Electronically Signed   By: Duanne Guess D.O.   On: 05/19/2023 08:11   US Venous Img Lower Unilateral Left (DVT)  Result Date: 05/18/2023 CLINICAL DATA:  Left leg pain and swelling EXAM: Left LOWER EXTREMITY VENOUS DOPPLER ULTRASOUND TECHNIQUE: Gray-scale sonography with compression, as well as color and duplex ultrasound, were performed to evaluate the deep venous system(s) from the level of the common femoral vein through the popliteal and proximal calf veins. COMPARISON:  12/14/2022 FINDINGS: VENOUS Normal compressibility of the common femoral, superficial femoral, and popliteal veins, as  well as the visualized calf veins. Visualized portions of profunda femoral vein and great saphenous vein unremarkable. No filling defects to suggest DVT on grayscale or color Doppler imaging. Doppler waveforms show normal direction of venous flow, normal respiratory plasticity and response to augmentation. Limited views of the contralateral common femoral vein are unremarkable. OTHER None. Limitations: none IMPRESSION: Negative. Electronically Signed   By: Paulina Fusi M.D.   On: 05/18/2023 15:18   DG Foot Complete Left  Result Date: 05/18/2023 CLINICAL DATA:  Pain and swelling EXAM: LEFT FOOT - COMPLETE 3+ VIEW COMPARISON:  12/12/2016 FINDINGS: No recent displaced fracture or dislocation is seen. Erosive changes are noted in the tip of distal phalanx of big toe. Distal portion of proximal phalanx of fourth toe and proximal portion of middle phalanx of the left fourth toe are missing. This may be residual from previous intervention. IMPRESSION: No recent fracture or dislocation is seen. Erosions are seen in the tip of distal phalanx of left big toe. If there is clinical suspicion for osteomyelitis, follow-up MRI may be considered. Electronically Signed   By: Ernie Avena M.D.   On: 05/18/2023 14:40    Microbiology: Results for orders placed or performed during the hospital encounter  of 05/18/23  Culture, blood (Routine X 2) w Reflex to ID Panel     Status: None   Collection Time: 05/20/23 10:58 PM   Specimen: BLOOD  Result Value Ref Range Status   Specimen Description   Final    BLOOD BLOOD RIGHT ARM Performed at [facility], [address], [place], [place] [address]    Special Requests   Final    BOTTLES DRAWN AEROBIC AND ANAEROBIC Blood Culture adequate volume Performed at [facility], [address], [place], [place] [address]    Culture   Final    NO GROWTH 5 DAYS Performed at [facility], [address] [place], [place] [address]    Report Status 05/25/2023 FINAL  Final  Culture, blood (Routine X 2) w Reflex to ID Panel     Status: None   Collection Time: 05/20/23 11:53 PM   Specimen: BLOOD  Result Value Ref Range Status   Specimen Description BLOOD BLOOD LEFT ARM  Final   Special Requests   Final    BOTTLES DRAWN AEROBIC AND ANAEROBIC Blood Culture adequate volume   Culture   Final    NO GROWTH 5 DAYS Performed at [facility], [address], [place], [place] [address]    Report Status 05/26/2023 FINAL  Final  Aerobic/Anaerobic Culture w Gram Stain (surgical/deep wound)     Status: None   Collection Time: 05/21/23  9:13 AM   Specimen: Bone; Tissue  Result Value Ref Range Status   Specimen Description   Final    BONE Performed at [facility], [address] [place], [place] [address]    Special Requests   Final    NONE Performed at [facility], [address] [place], [place] [address]    Gram Stain   Final    MODERATE WBC PRESENT, PREDOMINANTLY PMN RARE GRAM POSITIVE COCCI RARE GRAM POSITIVE RODS CRITICAL RESULT CALLED TO, READ BACK BY AND VERIFIED WITH: RN Georgian Co [id number] AT 1457 BY EC    Culture   Final    FEW STAPHYLOCOCCUS LUGDUNENSIS RARE DIPHTHEROIDS(CORYNEBACTERIUM SPECIES) Standardized susceptibility testing for this organism is not available. NO ANAEROBES  ISOLATED Performed at [facility], [address]  [place], [place] [address]    Report Status 05/26/2023 FINAL  Final   Organism ID, Bacteria STAPHYLOCOCCUS LUGDUNENSIS  Final      Susceptibility   Staphylococcus lugdunensis - MIC*    CIPROFLOXACIN <=0.5 SENSITIVE Sensitive     ERYTHROMYCIN <=0.25 SENSITIVE Sensitive     GENTAMICIN <=0.5 SENSITIVE Sensitive     OXACILLIN >=4 RESISTANT Resistant     TETRACYCLINE <=1 SENSITIVE Sensitive     VANCOMYCIN <=0.5 SENSITIVE Sensitive     TRIMETH/SULFA <=10 SENSITIVE Sensitive     CLINDAMYCIN <=0.25 SENSITIVE Sensitive     RIFAMPIN <=0.5 SENSITIVE Sensitive     Inducible Clindamycin NEGATIVE Sensitive     * FEW STAPHYLOCOCCUS LUGDUNENSIS    Labs: CBC: Recent Labs  Lab 05/28/23 0427 05/29/23 0430 05/30/23 0800 06/01/23 0356  WBC 14.2* 12.1* 11.7* 12.1*  NEUTROABS 10.6* 8.8*  --   --   HGB 8.7* 8.0* 8.1* 8.7*  HCT 27.3* 24.9* 25.0* 27.4*  MCV 92.2 91.5 91.9 93.2  PLT 241 278 360 478*   Basic Metabolic Panel: Recent Labs  Lab 05/28/23 0427 05/29/23 0430 05/30/23 0800 05/31/23 0420 06/01/23 0356  NA 138 138 139 139 135  K 4.6 4.0 3.8 4.1 4.2  CL 109 104 109 109 106  CO2 21* 20* 21* 22 23  GLUCOSE 153* 135* 154* 143* 134*  BUN 24* 25* 28* 28* 30*  CREATININE 1.65* 1.55* 1.45* 1.42* 1.36*  CALCIUM 8.2* 7.8* 8.2* 8.1* 8.1*   Liver Function Tests: No results for input(s): "AST", "ALT", "ALKPHOS", "BILITOT", "PROT", "ALBUMIN" in the last 168 hours. CBG: Recent Labs  Lab 06/02/23 1031 06/02/23 1139 06/02/23 1743 06/02/23 2103 06/03/23 0834  GLUCAP 160* 223* 126* 193* 152*    Discharge time spent: greater than 30 minutes.  Signed: Marcelino Duster, MD [facility] 06/03/2023

## 2023-06-03 NOTE — TOC Transition Note (Addendum)
Transition of Care [facility]) - CM/SW Discharge Note   Patient Details  Name: Jordan Arellano MRN: [id number] Date of Birth: 1951/06/03  [facility]) CM/SW Contact:  Liliana Cline, LCSW Phone Number: 06/03/2023, 10:33 AM   Clinical Narrative:    Discharge to [facility] today. Room 505. Confirmed with Admissions Worker Tiffany.  Updated MD, RN, and patient's sister [name]. [name] has medical questions- updated MD and RN.  [name] to transport per PT recs.  Asked RN to call report.  1:30- Per Tiffany at [facility], script was not sent with patient. Per RN, patient was not on any narcotics. Tiffany to call RN.    Final next level of care: Skilled Nursing Facility Barriers to Discharge: Barriers Resolved   Patient Goals and CMS Choice CMS Medicare.gov Compare Post Acute Care list provided to:: Patient Represenative (must comment) Choice offered to / list presented to : Sibling  Discharge Placement                Patient chooses bed at: [facility] Patient to be transferred to facility by: sister [place] Name of family member notified: sister [name] Patient and family notified of of transfer: 06/03/23  Discharge Plan and Services Additional resources added to the After Visit Summary for     Discharge Planning Services: CM Consult Post Acute Care Choice: Home Health                               Social Determinants of Health (SDOH) Interventions SDOH Screenings   Food Insecurity: No Food Insecurity (05/19/2023)  Housing: Low Risk  (05/19/2023)  Transportation Needs: No Transportation Needs (05/19/2023)  Utilities: Not At Risk (05/19/2023)  Alcohol Screen: Low Risk  (04/07/2022)  Depression (PHQ2-9): Low Risk  (04/07/2022)  Financial Resource Strain: Low Risk  (10/08/2021)  Physical Activity: Insufficiently Active (04/07/2022)  Social Connections: Socially Isolated (04/07/2022)  Stress: No Stress Concern Present (04/07/2022)  Tobacco Use: High  Risk (05/27/2023)     Readmission Risk Interventions     No data to display

## 2023-06-03 NOTE — Discharge Instructions (Addendum)
Dressing orders: Apply silicone border foam pads (Allevyn type) to dorsal foot and anterior lower leg 3 X's per week . Change dressing to amputation site at same time with gauze and loose gauze wrap and secure with paper tape.

## 2023-06-20 ENCOUNTER — Telehealth: Payer: Self-pay

## 2023-06-20 NOTE — Transitions of Care (Post Inpatient/ED Visit) (Unsigned)
   06/20/2023  Name: Mohab Ashby MRN: [id number] DOB: 29-Oct-1951  Today's TOC FU Call Status: Today's TOC FU Call Status:: Unsuccessul Call (1st Attempt) Unsuccessful Call (1st Attempt) Date: 06/20/23  Attempted to reach the patient regarding the most recent Inpatient/ED visit.  Follow Up Plan: Additional outreach attempts will be made to reach the patient to complete the Transitions of Care (Post Inpatient/ED visit) call.   Signature   Woodfin Ganja LPN [facility] Nurse Health Advisor Direct Dial [phone]

## 2023-06-21 ENCOUNTER — Telehealth: Payer: Self-pay | Admitting: Family

## 2023-06-21 ENCOUNTER — Other Ambulatory Visit: Payer: Self-pay | Admitting: Family

## 2023-06-21 DIAGNOSIS — E1151 Type 2 diabetes mellitus with diabetic peripheral angiopathy without gangrene: Secondary | ICD-10-CM

## 2023-06-21 MED ORDER — CVS GLUCOSE METER TEST STRIPS VI STRP
ORAL_STRIP | 3 refills | Status: DC
Start: 2023-06-21 — End: 2023-06-22

## 2023-06-21 MED ORDER — ASSURE PRISM MULTI TEST VI STRP: ORAL_STRIP | 12 refills | Status: DC

## 2023-06-21 NOTE — Addendum Note (Signed)
Addended by: Benedict Needy on: 06/21/2023 01:09 PM   Modules accepted: Orders

## 2023-06-21 NOTE — Telephone Encounter (Signed)
Called and spoke with [name], pt's sister.  She will reach out to podiatry concerning bactroban.  She is aware that glucose test strips have been sent in.

## 2023-06-21 NOTE — Telephone Encounter (Signed)
Spoke with [name], pt's sister she reported that pt needs Bactroban ointment for the wound on his foot.  [facility] [address]    Encouraged her that pt can still be seen in office while on Home health.  Asked her to speak with Home Health Nurse.

## 2023-06-21 NOTE — Telephone Encounter (Signed)
 LMTCB

## 2023-06-21 NOTE — Telephone Encounter (Signed)
Spoke with [name] pt has a Marketing executive.  Pended refill for test strips.

## 2023-06-21 NOTE — Telephone Encounter (Signed)
Prescription Request  06/21/2023  LOV: 04/25/2023  What is the name of the medication or equipment? Test strips for glucose monitor  Have you contacted your pharmacy to request a refill? No   Which pharmacy would you like this sent to?   [id number] [place], [place] - [address] [place] [address] Phone: [phone] Fax: [phone]    Patient notified that their request is being sent to the clinical staff for review and that they should receive a response within 2 business days.   Please advise at Mobile [phone] (mobile)

## 2023-06-21 NOTE — Telephone Encounter (Signed)
Looks like pt is being followed by podiatry for his foot.  I would reach out to them for this request and or have the home health nurse send [place] verbal orders for the RX however if podiatry ordered the [facility] the orders will likely be coming to him.   Also, if the foot is newly 'black' then this needs more urgent nature of care such as ER if this has changed from 7/3 visit with podiatry. This could be a circulatory concern.   Lastly sent in the test strips.

## 2023-06-21 NOTE — Telephone Encounter (Signed)
LMTCB.  Need to know what type of meter pt has and how often he is checking his blood sugar.

## 2023-06-21 NOTE — Telephone Encounter (Signed)
Patient sister called in and stated that Torben was discharged from [facility] on Sunday. She stated that the home health nurse came out and changed his bandage on his foot. She stated that the side of his foot is black. She was concerned about this. She stated that she was told he couldn't come in the office because he has home health coming out and his insurance wouldn't cover it. She would like a call back to further discuss this issue. She can be reached at [phone]. Thank you!

## 2023-06-21 NOTE — Addendum Note (Signed)
Addended by: Mort Sawyers on: 06/21/2023 01:22 PM   Modules accepted: Orders

## 2023-06-21 NOTE — Transitions of Care (Post Inpatient/ED Visit) (Unsigned)
   06/21/2023  Name: Jordan Arellano MRN: [id number] DOB: Apr 19, 1951  Today's TOC FU Call Status: Today's TOC FU Call Status:: Unsuccessful Call (2nd Attempt) Unsuccessful Call (1st Attempt) Date: 06/20/23 Unsuccessful Call (2nd Attempt) Date: 06/21/23  Attempted to reach the patient regarding the most recent Inpatient/ED visit.  Follow Up Plan: Additional outreach attempts will be made to reach the patient to complete the Transitions of Care (Post Inpatient/ED visit) call.   Signature   Woodfin Ganja LPN [facility] Nurse Health Advisor Direct Dial [phone]

## 2023-06-22 ENCOUNTER — Other Ambulatory Visit: Payer: Self-pay | Admitting: Family

## 2023-06-22 DIAGNOSIS — E1151 Type 2 diabetes mellitus with diabetic peripheral angiopathy without gangrene: Secondary | ICD-10-CM

## 2023-06-22 MED ORDER — ASSURE PRISM MULTI METER DEVI
0 refills | Status: DC
Start: 2023-06-22 — End: 2023-06-22

## 2023-06-22 MED ORDER — ASSURE LANCE LANCETS MISC
3 refills | Status: DC
Start: 2023-06-22 — End: 2023-06-22

## 2023-06-22 NOTE — Telephone Encounter (Signed)
Patient sister called in and stated that the Assure Meter and strips are no longer covered by his insurance. She stated that the pharmacy faxed over something 3 times regarding which monitors and strips would be covered under his insurance. He is completely out of test strips. She would like a call when this is done. Please advise. Thank you!

## 2023-06-22 NOTE — Addendum Note (Signed)
Addended by: Mort Sawyers on: 06/22/2023 01:07 PM   Modules accepted: Orders

## 2023-06-22 NOTE — Telephone Encounter (Signed)
Meter lancets and strips were sent to pharmacy.

## 2023-06-22 NOTE — Telephone Encounter (Addendum)
[facility] and she states she just changed him to Accu-Chek guide which is covered by [employer].  Will update on med list for future refills.   [name] notified of this via telephone.

## 2023-06-22 NOTE — Transitions of Care (Post Inpatient/ED Visit) (Signed)
   06/22/2023  Name: Jordan Arellano MRN: [id number] DOB: 02-26-1951  Today's TOC FU Call Status: Today's TOC FU Call Status:: Successful TOC FU Call Competed Unsuccessful Call (1st Attempt) Date: 06/20/23 Unsuccessful Call (2nd Attempt) Date: 06/21/23 Unsuccessful Call (3rd Attempt) Date: 06/22/23 [facility] FU Call Complete Date: 06/22/23  Attempted to reach the patient regarding the most recent Inpatient/ED visit.  Follow Up Plan: No further outreach attempts will be made at this time. We have been unable to contact the patient.  Signature  Woodfin Ganja LPN [facility] Nurse Health Advisor Direct Dial [phone]

## 2023-06-22 NOTE — Addendum Note (Signed)
Addended by: Damita Lack on: 06/22/2023 02:49 PM   Modules accepted: Orders

## 2023-06-28 ENCOUNTER — Telehealth: Payer: Self-pay | Admitting: Family

## 2023-06-28 NOTE — Telephone Encounter (Signed)
Called and advised Vernona Rieger with [facility] of the approval of the requested verbal orders for this patient. Advised to call back with any further questions.

## 2023-06-28 NOTE — Telephone Encounter (Signed)
Home Health verbal orders Caller Name: Aurelio Jew  Agency Name: [facility]  Callback number: [phone]  Requesting OT/PT/Skilled nursing/Social Work/Speech: Physical Therapy   Reason: Patient has just been released from hospital. Is for strengthening, balance and gait training with new prosthetic.   Frequency: 2wk 2, 1wk 2, every other week 4wk  Please forward to [facility] pool or providers CMA

## 2023-06-29 ENCOUNTER — Telehealth: Payer: Self-pay | Admitting: Family

## 2023-06-29 NOTE — Telephone Encounter (Signed)
Home Health verbal orders Caller Name: Iona Coach Agency Name: [facility] number: [phone]  Requesting OT  Frequency: 1x a week for 4 weeks  Please forward to [facility] pool or providers CMA

## 2023-07-01 NOTE — Telephone Encounter (Signed)
LMTCB

## 2023-07-06 NOTE — Telephone Encounter (Signed)
Attempted to call number listed in message. Phone number has been disconnected.

## 2023-07-07 NOTE — Telephone Encounter (Signed)
Called [facility] and got correct phone number for Zacharia- [phone].  Called and spoke with Iona Coach and gave her verbal orders for OT 1 x week for 4 weeks per Mort Sawyers, FNP.

## 2023-07-07 NOTE — Telephone Encounter (Signed)
Phone number provided form Iona Coach is not a working number.  Will forward to front office manager to see if she can get a correct phone number.

## 2023-07-08 ENCOUNTER — Telehealth (INDEPENDENT_AMBULATORY_CARE_PROVIDER_SITE_OTHER): Payer: Self-pay

## 2023-07-08 NOTE — Telephone Encounter (Signed)
Patient sister reach out informing that her brother was seen by Dr Alberteen Spindle and he is concern with non healing wound. He recommended for patient to schedule follow up with our office. I spoke with Vivia Birmingham NP and she advise that patient to be schedule with abi see herself or Dr Wyn Quaker.

## 2023-07-13 ENCOUNTER — Other Ambulatory Visit (INDEPENDENT_AMBULATORY_CARE_PROVIDER_SITE_OTHER): Payer: Self-pay | Admitting: Nurse Practitioner

## 2023-07-13 DIAGNOSIS — Z9889 Other specified postprocedural states: Secondary | ICD-10-CM

## 2023-07-13 DIAGNOSIS — L97929 Non-pressure chronic ulcer of unspecified part of left lower leg with unspecified severity: Secondary | ICD-10-CM

## 2023-07-14 ENCOUNTER — Ambulatory Visit (INDEPENDENT_AMBULATORY_CARE_PROVIDER_SITE_OTHER): Payer: 59 | Admitting: Nurse Practitioner

## 2023-07-14 ENCOUNTER — Ambulatory Visit (INDEPENDENT_AMBULATORY_CARE_PROVIDER_SITE_OTHER): Payer: 59

## 2023-07-14 ENCOUNTER — Encounter (INDEPENDENT_AMBULATORY_CARE_PROVIDER_SITE_OTHER): Payer: Self-pay | Admitting: Nurse Practitioner

## 2023-07-14 VITALS — BP 83/56 | HR 96 | Resp 18 | Ht 73.0 in | Wt 141.0 lb

## 2023-07-14 DIAGNOSIS — E1151 Type 2 diabetes mellitus with diabetic peripheral angiopathy without gangrene: Secondary | ICD-10-CM

## 2023-07-14 DIAGNOSIS — Z9889 Other specified postprocedural states: Secondary | ICD-10-CM

## 2023-07-14 DIAGNOSIS — L97929 Non-pressure chronic ulcer of unspecified part of left lower leg with unspecified severity: Secondary | ICD-10-CM | POA: Diagnosis not present

## 2023-07-14 DIAGNOSIS — I10 Essential (primary) hypertension: Secondary | ICD-10-CM | POA: Diagnosis not present

## 2023-07-14 DIAGNOSIS — I739 Peripheral vascular disease, unspecified: Secondary | ICD-10-CM

## 2023-07-14 LAB — VAS US ABI WITH/WO TBI: Left ABI: 0.81

## 2023-07-14 MED ORDER — PENTOXIFYLLINE ER 400 MG PO TBCR
400.0000 mg | EXTENDED_RELEASE_TABLET | Freq: Three times a day (TID) | ORAL | 1 refills | Status: AC
Start: 2023-07-14 — End: ?

## 2023-07-14 NOTE — Progress Notes (Signed)
Subjective:    Patient ID: Jordan Arellano, male    DOB: 01-10-1951, 72 y.o.   MRN: [id number] Chief Complaint  Patient presents with   Follow-up    bring in for non healing wound with ABI    The patient returns to the office for followup and review status post angiogram with intervention on 05/26/2023.   Procedure: Procedure(s) Performed:             1.  Ultrasound guidance for vascular access left posterior tibial artery             2.  Catheter placement into left common femoral artery from left posterior tibial approach             3.  Selective left lower extremity angiogram             4.  Percutaneous transluminal angioplasty of left posterior tibial artery and tibioperoneal trunk with 3 and 4 mm diameter angioplasty balloons             5.  Percutaneous transluminal angioplasty of the entire left SFA and popliteal arteries with 5 mm diameter Lutonix drug-coated angioplasty balloons             6.  Stent placement to the left tibioperoneal trunk and popliteal artery with 6 mm diameter by 25 cm length Viabahn stent             7.  Stent placement to the left SFA and popliteal arteries with 6 mm diameter by 25 cm length 6 mm diameter by 10 cm length Viabahn stent   The patient notes there has been some improvement in healing of wounds in the left lower extremity applied wound healing has been slow.  There is concern because the wound on the lateral portion of his foot has developed some fibrinous exudate and there is concern of whether he has adequate perfusion for continued wound healing.  He has a previous history of a right below-knee amputation.    There have been no significant changes to the patient's overall health care.  No documented history of amaurosis fugax or recent TIA symptoms. There are no recent neurological changes noted. No documented history of DVT, PE or superficial thrombophlebitis. The patient denies recent episodes of angina or shortness of breath.   ABI's  Rt=bka and Lt=0.81  (previous ABI's Rt=bka and Lt=0.48) Duplex US of the right lower extremity shows biphasic tibial vessel waveforms    Review of Systems  Cardiovascular:  Positive for leg swelling.  Skin:  Positive for wound.  Neurological:  Positive for weakness.  All other systems reviewed and are negative.      Objective:   Physical Exam Vitals reviewed.  HENT:     Head: Normocephalic.  Cardiovascular:     Rate and Rhythm: Normal rate.     Pulses:          Dorsalis pedis pulses are detected w/ Doppler on the left side.       Posterior tibial pulses are detected w/ Doppler on the left side.  Pulmonary:     Effort: Pulmonary effort is normal.  Musculoskeletal:     Right Lower Extremity: Right leg is amputated below knee.  Skin:    General: Skin is warm and dry.  Neurological:     Mental Status: He is alert.     BP (!) 83/56 (BP Location: Left Arm)   Pulse 96   Resp 18   Ht 6\' 1"  (1.854  m)   Wt 141 lb (64 kg)   BMI 18.60 kg/m   Past Medical History:  Diagnosis Date   Basal cell carcinoma 08/11/2022   R forearm - ED&C   Depression    Foot ulcer (HCC) 12/12/2016   Hyperlipidemia    Hypertension    PAD (peripheral artery disease) (HCC) ~2007   s/p R BKA for non-healing wound   Stroke (HCC) 03/2014   MRI: Acute nonhemorrhagic left paracentral pontine infarct. 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   TIA (transient ischemic attack) 01/2014    Social History   Socioeconomic History   Marital status: Widowed    Spouse name: Not on file   Number of children: 2   Years of education: Not on file   Highest education level: Not on file  Occupational History    Comment: Disabled  Tobacco Use   Smoking status: Some Days    Current packs/day: 15.00    Average packs/day: 15.0 packs/day for 60.0 years (900.0 ttl pk-yrs)    Types: Cigarettes   Smokeless tobacco: Never  Vaping Use    Vaping status: Never Used  Substance and Sexual Activity   Alcohol use: No   Drug use: No   Sexual activity: Not Currently    Birth control/protection: Abstinence  Other Topics Concern   Not on file  Social History Narrative   One boy youngest, killed in MVA    Social Determinants of Health   Financial Resource Strain: Low Risk  (10/08/2021)   Overall Financial Resource Strain (CARDIA)    Difficulty of Paying Living Expenses: Not hard at all  Food Insecurity: No Food Insecurity (05/19/2023)   Hunger Vital Sign    Worried About Running Out of Food in the Last Year: Never true    Ran Out of Food in the Last Year: Never true  Transportation Needs: No Transportation Needs (05/19/2023)   PRAPARE - Administrator, Civil Service (Medical): No    Lack of Transportation (Non-Medical): No  Physical Activity: Insufficiently Active (04/07/2022)   Exercise Vital Sign    Days of Exercise per Week: 2 days    Minutes of Exercise per Session: 30 min  Stress: No Stress Concern Present (04/07/2022)   [employer] of Occupational Health - Occupational Stress Questionnaire    Feeling of Stress : Not at all  Social Connections: Socially Isolated (04/07/2022)   Social Connection and Isolation Panel [NHANES]    Frequency of Communication with Friends and Family: Three times a week    Frequency of Social Gatherings with Friends and Family: Three times a week    Attends Religious Services: Never    Active Member of Clubs or Organizations: No    Attends Banker Meetings: Never    Marital Status: Widowed  Intimate Partner Violence: Not At Risk (05/19/2023)   Humiliation, Afraid, Rape, and Kick questionnaire    Fear of Current or Ex-Partner: No    Emotionally Abused: No    Physically Abused: No    Sexually Abused: No    Past Surgical History:  Procedure Laterality Date   AMPUTATION TOE Left 05/21/2023   Procedure: AMPUTATION LEFT 1st & 3rd TOES;  Surgeon: Linus Galas, DPM;   Location: [facility] ORS;  Service: Podiatry;  Laterality: Left;   BELOW KNEE LEG AMPUTATION Right    FEMORAL-POPLITEAL BYPASS GRAFT Left 12/17/2016   Procedure: BYPASS LEFT FEMORAL TO BELOW POPLITEAL ARTERY USING  PROPATEN GORE GRAFT;  Surgeon: Larina Earthly, MD;  Location[facility] OR;  Service: Vascular;  Laterality: Left;   FEMORAL-POPLITEAL BYPASS GRAFT Left 09/30/2017   Procedure: LEFT LEG ANGIOGRAM,  THROMBECTOMY, FEM-POPLITEAL BYPASS GRAFT, tHROMBECTOMY PERONEAL ARTERY AND POSTERIOR TIBIAL , ENDARTERECTOMY TIBIAL/PERONEAL TRUNK WITH BOVINE PATCH ANGIOPLASTY.;  Surgeon: Fransisco Hertz, MD;  Location: [facility] OR;  Service: Vascular;  Laterality: Left;   INTRAMEDULLARY (IM) NAIL INTERTROCHANTERIC Right 03/21/2023   Procedure: INTRAMEDULLARY (IM) NAIL INTERTROCHANTERIC;  Surgeon: Deeann Saint, MD;  Location: [facility] ORS;  Service: Orthopedics;  Laterality: Right;   LOWER EXTREMITY ANGIOGRAPHY Left 12/14/2022   Procedure: Lower Extremity Angiography;  Surgeon: Renford Dills, MD;  Location: [facility] INVASIVE CV LAB;  Service: Cardiovascular;  Laterality: Left;   LOWER EXTREMITY ANGIOGRAPHY Left 05/20/2023   Procedure: Lower Extremity Angiography;  Surgeon: Annice Needy, MD;  Location: [facility] INVASIVE CV LAB;  Service: Cardiovascular;  Laterality: Left;   LOWER EXTREMITY ANGIOGRAPHY Left 05/26/2023   Procedure: Lower Extremity Angiography;  Surgeon: Annice Needy, MD;  Location: [facility] INVASIVE CV LAB;  Service: Cardiovascular;  Laterality: Left;   LOWER EXTREMITY INTERVENTION  05/20/2023   Procedure: LOWER EXTREMITY INTERVENTION;  Surgeon: Annice Needy, MD;  Location: [facility] INVASIVE CV LAB;  Service: Cardiovascular;;   PERIPHERAL VASCULAR CATHETERIZATION Left 12/14/2016   Procedure: Abdominal Aortogram w/Lower Extremity;  Surgeon: Chuck Hint, MD;  Location: [facility] INVASIVE CV LAB;  Service: Cardiovascular;  Laterality: Left;   right cataract extraction     TRANSTHORACIC ECHOCARDIOGRAM  01/2014   To evaluate possible  CVA: EF 55-60%. GR 1 DD. No significant valvular lesions    Family History  Problem Relation Age of Onset   Hypertension Mother        Does not know history   Heart disease Mother    Stroke Mother    Diabetes Mother    Hypertension Father    Heart disease Father    Stroke Father    Diabetes Sister    Hypertension Sister    Heart disease Brother    Hypertension Brother     No Known Allergies     Latest Ref Rng & Units 06/01/2023    3:56 AM 05/30/2023    8:00 AM 05/29/2023    4:30 AM  CBC  WBC 4.0 - 10.5 K/uL 12.1  11.7  12.1   Hemoglobin 13.0 - 17.0 g/dL 8.7  8.1  8.0   Hematocrit 39.0 - 52.0 % 27.4  25.0  24.9   Platelets 150 - 400 K/uL 478  360  278       CMP     Component Value Date/Time   NA 135 06/01/2023 0356   NA 139 02/03/2016 0000   K 4.2 06/01/2023 0356   CL 106 06/01/2023 0356   CO2 23 06/01/2023 0356   GLUCOSE 134 (H) 06/01/2023 0356   BUN 30 (H) 06/01/2023 0356   BUN 28 (A) 02/03/2016 0000   CREATININE 1.36 (H) 06/01/2023 0356   CALCIUM 8.1 (L) 06/01/2023 0356   PROT 7.6 05/18/2023 1148   ALBUMIN 3.8 05/18/2023 1148   AST 19 05/18/2023 1148   ALT 11 05/18/2023 1148   ALKPHOS 68 05/18/2023 1148   BILITOT 0.1 (L) 05/18/2023 1148   GFR 66.55 01/24/2023 0850   GFRNONAA 56 (L) 06/01/2023 0356     VAS [place] ABI WITH/WO TBI  Result Date: 07/14/2023  LOWER EXTREMITY DOPPLER STUDY Patient Name:  KATO SIGNER  Date of Exam:   07/14/2023 Medical  Rec #: [id number]      Accession #:    [id number] Date of Birth: Dec 25, 1950      Patient Gender: M Patient Age:   89 years Exam Location:  [facility] Procedure:      VAS [place] ABI WITH/WO TBI Referring Phys: Sheppard Plumber --------------------------------------------------------------------------------  Indications: Claudication, and peripheral artery disease. High Risk Factors: Hypertension, hyperlipidemia, Diabetes, current smoker.  Vascular Interventions: 05/26/2023 Left PTA and tib per PTA. Left SFA and tib                          per stent                          12/14/2022 Bilateral common iliac artery kissing stents. Performing Technologist: Hardie Lora RVT  Examination Guidelines: A complete evaluation includes at minimum, Doppler waveform signals and systolic blood pressure reading at the level of bilateral brachial, anterior tibial, and posterior tibial arteries, when vessel segments are accessible. Bilateral testing is considered an integral part of a complete examination. Photoelectric Plethysmograph (PPG) waveforms and toe systolic pressure readings are included as required and additional duplex testing as needed. Limited examinations for reoccurring indications may be performed as noted.  ABI Findings: +--------+------------------+-----+--------+--------+ Right   Rt Pressure (mmHg)IndexWaveformComment  +--------+------------------+-----+--------+--------+ Brachial101                                     +--------+------------------+-----+--------+--------+ ATA                                    BKA      +--------+------------------+-----+--------+--------+ PTA                                    BKA      +--------+------------------+-----+--------+--------+ +---------+------------------+-----+--------+------------------+ Left     Lt Pressure (mmHg)IndexWaveformComment            +---------+------------------+-----+--------+------------------+ Brachial 103                                               +---------+------------------+-----+--------+------------------+ PTA      77                0.75 biphasic                   +---------+------------------+-----+--------+------------------+ PERO     83                0.82 biphasic                   +---------+------------------+-----+--------+------------------+ DP       56                0.54 biphasic                   +---------+------------------+-----+--------+------------------+ Great Toe                                Partial amputation +---------+------------------+-----+--------+------------------+ +-------+-----------+-----------+------------+------------+ ABI/TBIToday's ABIToday's TBIPrevious ABIPrevious TBI +-------+-----------+-----------+------------+------------+  Right  BKA                   BKA                      +-------+-----------+-----------+------------+------------+ Left   0.81       present    0.48        0.21         +-------+-----------+-----------+------------+------------+  Left ABIs appear increased compared to prior study on 01/12/2023.  Summary: Left: Resting left ankle-brachial index indicates mild left lower extremity arterial disease. *See table(s) above for measurements and observations.  Electronically signed by Levora Dredge MD on 07/14/2023 at 3:11:18 PM.    Final        Assessment & Plan:   1. Peripheral arterial disease with history of revascularization (HCC) Today noninvasive studies are improved compared to his previous noninvasive studies.  Based on this he should have adequate perfusion for wound healing.  There is some concern that while he may have microvascular circulation without significant issue, the underlying issue with him is more so some microvascular disease.  We had a long discussion with the patient and family member in regards to why this is typically not amenable to endovascular treatment due to vessel size etc.  In order to try to improve his microvascular circulation we will attempt to utilize some Trental.  We will have the patient follow-up in several weeks to know if there is improvement in the wound.  If he continues to have stagnant wound healing another angiogram may be necessary.  We also advised the patient to begin utilizing the Santyl as prescribed by has podiatrist as this will likely help as well.  The patient will return in 7 to 8 weeks in order to evaluate wound healing.  2. Essential hypertension Continue  antihypertensive medications as already ordered, these medications have been reviewed and there are no changes at this time.  3. DM (diabetes mellitus) with peripheral vascular complication (HCC) Continue hypoglycemic medications as already ordered, these medications have been reviewed and there are no changes at this time.  Hgb A1C to be monitored as already arranged by primary service   Current Outpatient Medications on File Prior to Visit  Medication Sig Dispense Refill   Accu-Chek FastClix Lancets MISC Use to check blood sugar 2 times a day     amLODipine (NORVASC) 10 MG tablet Take 10 mg by mouth daily.     ascorbic acid (VITAMIN C) 500 MG tablet Take 1 tablet (500 mg total) by mouth daily. 30 tablet 2   aspirin 81 MG tablet Take 81 mg by mouth daily.     Blood Glucose Monitoring Suppl (ACCU-CHEK GUIDE) w/Device KIT Use to check blood sugar 2 times a day     clopidogrel (PLAVIX) 75 MG tablet Take 1 tablet (75 mg total) by mouth daily. 30 tablet 5   ergocalciferol (VITAMIN D2) 1.25 MG (50000 UT) capsule Take 1 capsule by mouth once a week.     glucose blood (ACCU-CHEK GUIDE) test strip Use to check blood sugar 2 times a day     icosapent Ethyl (VASCEPA) 1 g capsule Take 2 capsules (2 g total) by mouth 2 (two) times daily. 360 capsule 1   insulin aspart (NOVOLOG) 100 UNIT/ML injection Inject 0-15 Units into the skin 3 (three) times daily with meals. CBG < 70: Implement Hypoglycemia protocol CBG 70 - 120: 0 units CBG 121 - 150: 2 units CBG  151 - 200: 3 units CBG 201 - 250: 5 units CBG 251 - 300: 8 units CBG 301 - 350: 11 units CBG 351 - 400: 15 units CBG > 400: call MD and obtain STAT lab verification 10 mL 2   latanoprost (XALATAN) 0.005 % ophthalmic solution Place 1 drop into both eyes at bedtime.     Multiple Vitamin (MULTIVITAMIN WITH MINERALS) TABS tablet Take 1 tablet by mouth daily. 30 tablet 3   mupirocin ointment (BACTROBAN) 2 % Apply 1 Application topically 3 (three) times  daily.     nicotine (NICODERM CQ - DOSED IN MG/24 HOURS) 14 mg/24hr patch Place 1 patch (14 mg total) onto the skin daily as needed (nicotine craving). 28 patch 0   Nutritional Supplements (ENSURE PLUS HIGH PROTEIN) LIQD Take 237 mLs by mouth in the morning, at noon, and at bedtime. 237 mL 12   rosuvastatin (CRESTOR) 40 MG tablet Take 1 tablet (40 mg total) by mouth daily. 90 tablet 3   SANTYL 250 UNIT/GM ointment Apply 1 Application topically daily.     senna-docusate (SENOKOT-S) 8.6-50 MG tablet Take 1 tablet by mouth at bedtime as needed for mild constipation. 30 tablet 0   sertraline (ZOLOFT) 100 MG tablet Take 1 tablet (100 mg total) by mouth daily. 90 tablet 3   No current facility-administered medications on file prior to visit.    There are no Patient Instructions on file for this visit. No follow-ups on file.   Georgiana Spinner, NP

## 2023-07-25 ENCOUNTER — Other Ambulatory Visit: Payer: 59

## 2023-07-25 ENCOUNTER — Ambulatory Visit: Payer: 59 | Admitting: Family

## 2023-07-26 ENCOUNTER — Encounter: Payer: Self-pay | Admitting: Family

## 2023-08-15 ENCOUNTER — Telehealth: Payer: Self-pay | Admitting: Family

## 2023-08-15 NOTE — Telephone Encounter (Signed)
LM for Darl Pikes with [facility] to return call x1.  Pt has bee scheduled for an OV with Tabitha on 08/29/2023 at 1140.

## 2023-08-15 NOTE — Telephone Encounter (Signed)
Spoke with Darl Pikes. Verbal orders have been given. Nothing further was needed.

## 2023-08-15 NOTE — Telephone Encounter (Signed)
Home Health verbal orders Caller Name: Ermalene Searing Agency Name: [facility]  Callback number: [phone] (secure line)  Reason: Seen patient today for recert for wound care   Please forward to [facility] pool or providers CMA

## 2023-08-15 NOTE — Telephone Encounter (Signed)
Mort Sawyers, FNP  P [facility] Pool Pt overdue for hospital follow up and due for repeat labs, please make appt. --------------------------- Called and spoke with pt, he asked me to call his sister, [name]. Spoke with [name] and pt's appointment has been scheduled for 08/29/2023 at 1140. Nothing further was needed.

## 2023-08-15 NOTE — Telephone Encounter (Signed)
Verbal orders ok.  However just sent another open message requesting pt get in for f/u appt as overdue.

## 2023-08-18 ENCOUNTER — Ambulatory Visit (INDEPENDENT_AMBULATORY_CARE_PROVIDER_SITE_OTHER): Payer: 59

## 2023-08-18 VITALS — Ht 73.0 in | Wt 141.0 lb

## 2023-08-18 DIAGNOSIS — Z122 Encounter for screening for malignant neoplasm of respiratory organs: Secondary | ICD-10-CM | POA: Diagnosis not present

## 2023-08-18 DIAGNOSIS — Z0000 Encounter for general adult medical examination without abnormal findings: Secondary | ICD-10-CM | POA: Diagnosis not present

## 2023-08-18 NOTE — Progress Notes (Signed)
Subjective:   Jordan Arellano is a 72 y.o. male who presents for Medicare Annual/Subsequent preventive examination.  Visit Complete: Virtual  I connected with  [name] on 08/18/23 by a audio enabled telemedicine application and verified that I am speaking with the correct person using two identifiers.  Patient Location: Home  Provider Location: Home Office  I discussed the limitations of evaluation and management by telemedicine. The patient expressed understanding and agreed to proceed.  Patient Medicare AWV questionnaire was completed by the patient on 08/18/2023; I have confirmed that all information answered by patient is correct and no changes since this date.  Review of Systems    Vital Signs: Unable to obtain new vitals due to this being a telehealth visit.  Cardiac Risk Factors include: advanced age (>13men, >2 women);diabetes mellitus;dyslipidemia;male gender;hypertension;sedentary lifestyleNutrition Risk Assessment:  Has the patient had any N/V/D within the last 2 months?  No  Does the patient have any non-healing wounds?  Yes  Has the patient had any unintentional weight loss or weight gain?  No   Diabetes:  Is the patient diabetic?  Yes  If diabetic, was a CBG obtained today?  No  Did the patient bring in their glucometer from home?  No  How often do you monitor your CBG's? Daily .   Financial Strains and Diabetes Management:  Are you having any financial strains with the device, your supplies or your medication? No .  Does the patient want to be seen by Chronic Care Management for management of their diabetes?  No  Would the patient like to be referred to a Nutritionist or for Diabetic Management?  No   Diabetic Exams:  Diabetic Eye Exam: Completed 11/2022 Diabetic Foot Exam: Overdue, Pt has been advised about the importance in completing this exam. Pt is scheduled for diabetic foot exam on next office visit .      Objective:    Today's Vitals    08/18/23 1334  Weight: 141 lb (64 kg)  Height: 6\' 1"  (1.854 m)   Body mass index is 18.6 kg/m.     08/18/2023    1:38 PM 05/19/2023   11:44 PM 03/27/2023    4:00 PM 03/21/2023   11:36 AM 03/21/2023    2:00 AM 03/20/2023   11:15 PM 12/14/2022    8:02 AM  Advanced Directives  Does Patient Have a Medical Advance Directive? Yes No Yes No No No No  Type of Estate agent of [place];Living will  Healthcare Power of Attorney      Does patient want to make changes to medical advance directive? No - Patient declined  No - Patient declined      Copy of Healthcare Power of Attorney in Chart? Yes - validated most recent copy scanned in chart (See row information)  No - copy requested      Would patient like information on creating a medical advance directive?  No - Patient declined No - Patient declined No - Patient declined No - Patient declined  No - Patient declined    Current Medications (verified) Outpatient Encounter Medications as of 08/18/2023  Medication Sig   Accu-Chek FastClix Lancets MISC Use to check blood sugar 2 times a day   amLODipine (NORVASC) 10 MG tablet Take 10 mg by mouth daily.   ascorbic acid (VITAMIN C) 500 MG tablet Take 1 tablet (500 mg total) by mouth daily.   aspirin 81 MG tablet Take 81 mg by mouth daily.   Blood Glucose  Monitoring Suppl (ACCU-CHEK GUIDE) w/Device KIT Use to check blood sugar 2 times a day   clopidogrel (PLAVIX) 75 MG tablet Take 1 tablet (75 mg total) by mouth daily.   ergocalciferol (VITAMIN D2) 1.25 MG (50000 UT) capsule Take 1 capsule by mouth once a week.   glucose blood (ACCU-CHEK GUIDE) test strip Use to check blood sugar 2 times a day   insulin aspart (NOVOLOG) 100 UNIT/ML injection Inject 0-15 Units into the skin 3 (three) times daily with meals. CBG < 70: Implement Hypoglycemia protocol CBG 70 - 120: 0 units CBG 121 - 150: 2 units CBG 151 - 200: 3 units CBG 201 - 250: 5 units CBG 251 - 300: 8 units CBG 301 - 350: 11  units CBG 351 - 400: 15 units CBG > 400: call MD and obtain STAT lab verification   latanoprost (XALATAN) 0.005 % ophthalmic solution Place 1 drop into both eyes at bedtime.   Multiple Vitamin (MULTIVITAMIN WITH MINERALS) TABS tablet Take 1 tablet by mouth daily.   mupirocin ointment (BACTROBAN) 2 % Apply 1 Application topically 3 (three) times daily.   nicotine (NICODERM CQ - DOSED IN MG/24 HOURS) 14 mg/24hr patch Place 1 patch (14 mg total) onto the skin daily as needed (nicotine craving).   Nutritional Supplements (ENSURE PLUS HIGH PROTEIN) LIQD Take 237 mLs by mouth in the morning, at noon, and at bedtime.   pentoxifylline (TRENTAL) 400 MG CR tablet Take 1 tablet (400 mg total) by mouth 3 (three) times daily with meals.   rosuvastatin (CRESTOR) 40 MG tablet Take 1 tablet (40 mg total) by mouth daily.   SANTYL 250 UNIT/GM ointment Apply 1 Application topically daily.   senna-docusate (SENOKOT-S) 8.6-50 MG tablet Take 1 tablet by mouth at bedtime as needed for mild constipation.   sertraline (ZOLOFT) 100 MG tablet Take 1 tablet (100 mg total) by mouth daily.   icosapent Ethyl (VASCEPA) 1 g capsule Take 2 capsules (2 g total) by mouth 2 (two) times daily.   No facility-administered encounter medications on file as of 08/18/2023.    Allergies (verified) Patient has no known allergies.   History: Past Medical History:  Diagnosis Date   Basal cell carcinoma 08/11/2022   R forearm - ED&C   Depression    Foot ulcer (HCC) 12/12/2016   Hyperlipidemia    Hypertension    PAD (peripheral artery disease) (HCC) ~2007   s/p R BKA for non-healing wound   Stroke (HCC) 03/2014   MRI: Acute nonhemorrhagic left paracentral pontine infarct. 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   TIA (transient ischemic attack) 01/2014   Past Surgical History:  Procedure Laterality Date   AMPUTATION TOE Left 05/21/2023   Procedure:  AMPUTATION LEFT 1st & 3rd TOES;  Surgeon: Linus Galas, DPM;  Location: [facility] ORS;  Service: Podiatry;  Laterality: Left;   BELOW KNEE LEG AMPUTATION Right    FEMORAL-POPLITEAL BYPASS GRAFT Left 12/17/2016   Procedure: BYPASS LEFT FEMORAL TO BELOW POPLITEAL ARTERY USING PROPATEN GORE GRAFT;  Surgeon: Larina Earthly, MD;  Location: [facility] OR;  Service: Vascular;  Laterality: Left;   FEMORAL-POPLITEAL BYPASS GRAFT Left 09/30/2017   Procedure: LEFT LEG ANGIOGRAM,  THROMBECTOMY, FEM-POPLITEAL BYPASS GRAFT, tHROMBECTOMY PERONEAL ARTERY AND POSTERIOR TIBIAL , ENDARTERECTOMY TIBIAL/PERONEAL TRUNK WITH BOVINE PATCH ANGIOPLASTY.;  Surgeon: Fransisco Hertz, MD;  Location: [facility] OR;  Service: Vascular;  Laterality: Left;   INTRAMEDULLARY (IM) NAIL INTERTROCHANTERIC Right 03/21/2023  Procedure: INTRAMEDULLARY (IM) NAIL INTERTROCHANTERIC;  Surgeon: Deeann Saint, MD;  Location: [facility] ORS;  Service: Orthopedics;  Laterality: Right;   LOWER EXTREMITY ANGIOGRAPHY Left 12/14/2022   Procedure: Lower Extremity Angiography;  Surgeon: Renford Dills, MD;  Location: [facility] INVASIVE CV LAB;  Service: Cardiovascular;  Laterality: Left;   LOWER EXTREMITY ANGIOGRAPHY Left 05/20/2023   Procedure: Lower Extremity Angiography;  Surgeon: Annice Needy, MD;  Location: [facility] INVASIVE CV LAB;  Service: Cardiovascular;  Laterality: Left;   LOWER EXTREMITY ANGIOGRAPHY Left 05/26/2023   Procedure: Lower Extremity Angiography;  Surgeon: Annice Needy, MD;  Location: [facility] INVAS[facility] CV LAB;  Service: Cardiovascular;  Laterality: Left;   LOWER EXTREMITY INTERVENTION  05/20/2023   Procedure: LOWER EXTREMITY INTERVENTION;  Surgeon: Annice Needy, MD;  Location: [facility] INV[facility];  Service: Cardiovascular;;   PERIPHERAL VASCULAR CATHETERIZATION Left 12/14/2016   Procedure: Abdominal Aortogram w/Lower Extremity;  Surgeon: Chuck Hint, MD;  Location: [facility] CV LAB;  Service: Cardiovascular;  Laterality: Left;   right cataract extraction      TRANSTHORACIC ECHOCARDIOGRAM  01/2014   To evaluate possible CVA: EF 55-60%. GR 1 DD. No significant valvular lesions   Family History  Problem Relation Age of Onset   Hypertension Mother        Does not know history   Heart disease Mother    Stroke Mother    Diabetes Mother    Hypertension Father    Heart disease Father    Stroke Father    Diabetes Sister    Hypertension Sister    Heart disease Brother    Hypertension Brother    Social History   Socioeconomic History   Marital status: Widowed    Spouse name: Not on file   Number of children: 2   Years of education: Not on file   Highest education level: Not on file  Occupational History    Comment: Disabled  Tobacco Use   Smoking status: Some Days    Current packs/day: 15.00    Average packs/day: 15.0 packs/day for 60.0 years (900.0 ttl pk-yrs)    Types: Cigarettes   Smokeless tobacco: Never  Vaping Use   Vaping status: Never Used  Substance and Sexual Activity   Alcohol use: No   Drug use: No   Sexual activity: Not Currently    Birth control/protection: Abstinence  Other Topics Concern   Not on file  Social History Narrative   One boy youngest, killed in MVA    Social Determinants of Health   Financial Resource Strain: Low Risk  (08/18/2023)   Overall Financial Resource Strain (CARDIA)    Difficulty of Paying Living Expenses: Not hard at all  Food Insecurity: No Food Insecurity (08/18/2023)   Hunger Vital Sign    Worried About Running Out of Food in the Last Year: Never true    Ran Out of Food in the Last Year: Never true  Transportation Needs: No Transportation Needs (08/18/2023)   PRAPARE - Administrator, Civil Service (Medical): No    Lack of Transportation (Non-Medical): No  Physical Activity: Insufficiently Active (08/18/2023)   Exercise Vital Sign    Days of Exercise per Week: 3 days    Minutes of Exercise per Session: 30 min  Stress: No Stress Concern Present (08/18/2023)   [employer] - Occupational Stress Questionnaire    Feeling of Stress : Not at all  Social Connections: Socially Isolated (08/18/2023)   Social Connection and  Isolation Panel [NHANES]    Frequency of Communication with Friends and Family: More than three times a week    Frequency of Social Gatherings with Friends and Family: More than three times a week    Attends Religious Services: Never    Database administrator or Organizations: No    Attends Banker Meetings: Never    Marital Status: Widowed    Tobacco Counseling Ready to quit: Not Answered Counseling given: Not Answered   Clinical Intake:  Pre-visit preparation completed: Yes  Pain : No/denies pain     Nutritional Risks: None Diabetes: Yes CBG done?: No Did pt. bring in CBG monitor from home?: No  How often do you need to have someone help you when you read instructions, pamphlets, or other written materials from your doctor or pharmacy?: 1 - Never  Interpreter Needed?: No  Information entered by :: Renie Ora, LPN   Activities of Daily Living    08/18/2023    1:38 PM 05/19/2023   11:46 PM  In your present state of health, do you have any difficulty performing the following activities:  Hearing? 0   Vision? 0   Difficulty concentrating or making decisions? 0   Walking or climbing stairs? 0   Dressing or bathing? 0   Doing errands, shopping? 0 0  Preparing Food and eating ? N   Using the Toilet? N   In the past six months, have you accidently leaked urine? N   Do you have problems with loss of bowel control? N   Managing your Medications? N   Managing your Finances? N   Housekeeping or managing your Housekeeping? N     Patient Care Team: Mort Sawyers, FNP as PCP - General (Family Medicine) Gaspar Cola, [facility] (Inactive) as Pharmacist (Pharmacist)  Indicate any recent Medical Services you may have received from other than [facility] providers in the past year (date may  be approximate).     Assessment:   This is a routine wellness examination for Jordan Arellano.  Hearing/Vision screen Vision Screening - Comments:: Wears rx glasses - up to date with routine eye exams with  Dr.groat    Goals Addressed             This Visit's Progress    Patient Stated   On track    Start walking more       Depression Screen    08/18/2023    1:37 PM 04/07/2022    2:39 PM 12/22/2021   11:08 AM 09/24/2021    8:13 AM 05/26/2021    1:10 PM 10/16/2020   12:54 PM 06/27/2020   11:22 AM  PHQ 2/9 Scores  PHQ - 2 Score 0 0 0 0 0 0 0  PHQ- 9 Score   0  0  0    Fall Risk    08/18/2023    1:35 PM 04/18/2023    2:01 PM 04/07/2022    2:38 PM 01/21/2022   11:34 AM 12/29/2021   11:06 AM  Fall Risk   Falls in the past year? 0 1 0 1 0  Number falls in past yr: 0 0 0 0 0  Injury with Fall? 0 1 0 1 0  Risk for fall due to : No Fall Risks Impaired balance/gait;History of fall(s)  History of fall(s) No Fall Risks  Follow up Falls prevention discussed Falls evaluation completed Falls evaluation completed Falls evaluation completed Falls evaluation completed  Comment   uses cane  MEDICARE RISK AT HOME: Medicare Risk at Home Any stairs in or around the home?: No If so, are there any without handrails?: No Home free of loose throw rugs in walkways, pet beds, electrical cords, etc?: Yes Adequate lighting in your home to reduce risk of falls?: Yes Life alert?: No Use of a cane, walker or w/c?: Yes Grab bars in the bathroom?: Yes Shower chair or bench in shower?: Yes Elevated toilet seat or a handicapped toilet?: Yes  TIMED UP AND GO:  Was the test performed?  No    Cognitive Function:    04/25/2023   12:49 PM 08/10/2022   11:06 AM  MMSE - Mini Mental State Exam  Orientation to time 5 5  Orientation to Place 5 5  Registration 3 3  Attention/ Calculation 0   Attention/Calculation-comments he states since childhood can not do math   Recall 1 3  Language- name 2 objects 2  2  Language- repeat 1 1  Language- follow 3 step command 3 3  Language- read & follow direction 1 1  Write a sentence 1 1  Copy design 1 1  Total score 23         08/18/2023    1:38 PM  6CIT Screen  What Year? 0 points  What month? 0 points  What time? 0 points  Count back from 20 0 points  Months in reverse 0 points  Repeat phrase 0 points  Total Score 0 points    Immunizations Immunization History  Administered Date(s) Administered   Fluad Quad(high Dose 65+) 10/30/2019, 12/29/2021   Pneumococcal Conjugate-13 05/26/2021   Tdap 05/26/2021    TDAP status: Up to date  Flu Vaccine status: Up to date  Pneumococcal vaccine status: Up to date  Covid-19 vaccine status: Completed vaccines  Qualifies for Shingles Vaccine? Yes   Zostavax completed No   Shingrix Completed?: No.    Education has been provided regarding the importance of this vaccine. Patient has been advised to call insurance company to determine out of pocket expense if they have not yet received this vaccine. Advised may also receive vaccine at local pharmacy or Health Dept. Verbalized acceptance and understanding.  Screening Tests Health Maintenance  Topic Date Due   COVID-19 Vaccine (1) Never done   Zoster Vaccines- Shingrix (1 of 2) Never done   Lung Cancer Screening  Never done   INFLUENZA VACCINE  07/07/2023   OPHTHALMOLOGY EXAM  07/09/2023   HEMOGLOBIN A1C  07/25/2023   Pneumonia Vaccine 13+ Years old (2 of 2 - PPSV23 or PCV20) 01/20/2024 (Originally 07/21/2021)   Fecal DNA (Cologuard)  01/20/2024 (Originally 03/04/2022)   Diabetic kidney evaluation - Urine ACR  01/25/2024   FOOT EXAM  02/01/2024   Diabetic kidney evaluation - eGFR measurement  05/31/2024   Medicare Annual Wellness (AWV)  08/17/2024   DTaP/Tdap/Td (2 - Td or Tdap) 05/27/2031   Hepatitis C Screening  Completed   HPV VACCINES  Aged Out    Health Maintenance  Health Maintenance Due  Topic Date Due   COVID-19 Vaccine (1) Never  done   Zoster Vaccines- Shingrix (1 of 2) Never done   Lung Cancer Screening  Never done   INFLUENZA VACCINE  07/07/2023   OPHTHALMOLOGY EXAM  07/09/2023   HEMOGLOBIN A1C  07/25/2023    Colorectal cancer screening: Referral to GI placed declined by patient . Pt aware the office will call re: appt.  Lung Cancer Screening: (Low Dose CT Chest recommended if Age 16-80  years, 20 pack-year currently smoking OR have quit w/in 15years.) does qualify.   Lung Cancer Screening Referral: 08/18/2023  Additional Screening:  Hepatitis C Screening: does not qualify; Completed 01/23/2022  Vision Screening: Recommended annual ophthalmology exams for early detection of glaucoma and other disorders of the eye. Is the patient up to date with their annual eye exam?  Yes  Who is the provider or what is the name of the office in which the patient attends annual eye exams? Dr.Groat  If pt is not established with a provider, would they like to be referred to a provider to establish care? No .   Dental Screening: Recommended annual dental exams for proper oral hygiene   Community Resource Referral / Chronic Care Management: CRR required this visit?  No   CCM required this visit?  No     Plan:     I have personally reviewed and noted the following in the patient's chart:   Medical and social history Use of alcohol, tobacco or illicit drugs  Current medications and supplements including opioid prescriptions. Patient is not currently taking opioid prescriptions. Functional ability and status Nutritional status Physical activity Advanced directives List of other physicians Hospitalizations, surgeries, and ER visits in previous 12 months Vitals Screenings to include cognitive, depression, and falls Referrals and appointments  In addition, I have reviewed and discussed with patient certain preventive protocols, quality metrics, and best practice recommendations. A written personalized care plan for  preventive services as well as general preventive health recommendations were provided to patient.     LARWRENCE BRAZZEL, LPN   08/03/5620   After Visit Summary: (MyChart) Due to this being a telephonic visit, the after visit summary with patients personalized plan was offered to patient via MyChart   Nurse Notes: none

## 2023-08-18 NOTE — Patient Instructions (Signed)
Mr. Bergeman , Thank you for taking time to come for your Medicare Wellness Visit. I appreciate your ongoing commitment to your health goals. Please review the following plan we discussed and let me know if I can assist you in the future.   Referrals/Orders/Follow-Ups/Clinician Recommendations: Aim for 30 minutes of exercise or brisk walking, 6-8 glasses of water, and 5 servings of fruits and vegetables each day.   This is a list of the screening recommended for you and due dates:  Health Maintenance  Topic Date Due   COVID-19 Vaccine (1) Never done   Zoster (Shingles) Vaccine (1 of 2) Never done   Screening for Lung Cancer  Never done   Flu Shot  07/07/2023   Eye exam for diabetics  07/09/2023   Hemoglobin A1C  07/25/2023   Pneumonia Vaccine (2 of 2 - PPSV23 or PCV20) 01/20/2024*   Cologuard (Stool DNA test)  01/20/2024*   Yearly kidney health urinalysis for diabetes  01/25/2024   Complete foot exam   02/01/2024   Yearly kidney function blood test for diabetes  05/31/2024   Medicare Annual Wellness Visit  08/17/2024   DTaP/Tdap/Td vaccine (2 - Td or Tdap) 05/27/2031   Hepatitis C Screening  Completed   HPV Vaccine  Aged Out  *Topic was postponed. The date shown is not the original due date.    Advanced directives: (In Chart) A copy of your advanced directives are scanned into your chart should your provider ever need it.  Next Medicare Annual Wellness Visit scheduled for next year: Yes  Insert Preventive Care attachment Insert FALL PREVENTION attachment if needed

## 2023-08-19 ENCOUNTER — Telehealth: Payer: Self-pay | Admitting: Family

## 2023-08-19 NOTE — Telephone Encounter (Signed)
Pt's sister, [name], called stating she received a $50 no-show fee from pt's missed visit on 07/25/23. [name] states she doesn't remember the appt or else, pt would've attended appt. Told [name] appt was made back in Feb 2024. [name] stated she didn't received any text or calls for reminders. [name] stated she spoke to [facility] billing & advised to contact our office so the fee can possibly be removed. Please advise. Call back # [phone]

## 2023-08-24 ENCOUNTER — Ambulatory Visit: Payer: 59 | Admitting: Family

## 2023-08-29 ENCOUNTER — Ambulatory Visit: Payer: 59 | Admitting: Family

## 2023-09-01 ENCOUNTER — Ambulatory Visit: Payer: 59 | Admitting: Family

## 2023-09-01 ENCOUNTER — Encounter: Payer: Self-pay | Admitting: Family

## 2023-09-01 ENCOUNTER — Ambulatory Visit (INDEPENDENT_AMBULATORY_CARE_PROVIDER_SITE_OTHER)
Admission: RE | Admit: 2023-09-01 | Discharge: 2023-09-01 | Disposition: A | Discharge status: Home / Self Care | Payer: 59 | Source: Ambulatory Visit | Attending: Family | Admitting: Family

## 2023-09-01 VITALS — BP 128/58 | HR 94 | Temp 97.8°F | Ht 73.0 in | Wt 138.8 lb

## 2023-09-01 DIAGNOSIS — E781 Pure hyperglyceridemia: Secondary | ICD-10-CM | POA: Diagnosis not present

## 2023-09-01 DIAGNOSIS — E119 Type 2 diabetes mellitus without complications: Secondary | ICD-10-CM

## 2023-09-01 DIAGNOSIS — E1151 Type 2 diabetes mellitus with diabetic peripheral angiopathy without gangrene: Secondary | ICD-10-CM | POA: Diagnosis not present

## 2023-09-01 DIAGNOSIS — I70222 Atherosclerosis of native arteries of extremities with rest pain, left leg: Secondary | ICD-10-CM

## 2023-09-01 DIAGNOSIS — Z23 Encounter for immunization: Secondary | ICD-10-CM

## 2023-09-01 DIAGNOSIS — D631 Anemia in chronic kidney disease: Secondary | ICD-10-CM

## 2023-09-01 DIAGNOSIS — Z87891 Personal history of nicotine dependence: Secondary | ICD-10-CM

## 2023-09-01 DIAGNOSIS — M86072 Acute hematogenous osteomyelitis, left ankle and foot: Secondary | ICD-10-CM | POA: Diagnosis not present

## 2023-09-01 DIAGNOSIS — N1831 Chronic kidney disease, stage 3a: Secondary | ICD-10-CM | POA: Diagnosis not present

## 2023-09-01 DIAGNOSIS — L97509 Non-pressure chronic ulcer of other part of unspecified foot with unspecified severity: Secondary | ICD-10-CM

## 2023-09-01 DIAGNOSIS — R000 Tachycardia, unspecified: Secondary | ICD-10-CM

## 2023-09-01 DIAGNOSIS — R809 Proteinuria, unspecified: Secondary | ICD-10-CM

## 2023-09-01 DIAGNOSIS — Z79899 Other long term (current) drug therapy: Secondary | ICD-10-CM | POA: Diagnosis not present

## 2023-09-01 DIAGNOSIS — I7025 Atherosclerosis of native arteries of other extremities with ulceration: Secondary | ICD-10-CM

## 2023-09-01 DIAGNOSIS — R0689 Other abnormalities of breathing: Secondary | ICD-10-CM | POA: Diagnosis not present

## 2023-09-01 DIAGNOSIS — E559 Vitamin D deficiency, unspecified: Secondary | ICD-10-CM | POA: Diagnosis not present

## 2023-09-01 DIAGNOSIS — E441 Mild protein-calorie malnutrition: Secondary | ICD-10-CM

## 2023-09-01 DIAGNOSIS — I10 Essential (primary) hypertension: Secondary | ICD-10-CM | POA: Diagnosis not present

## 2023-09-01 DIAGNOSIS — E785 Hyperlipidemia, unspecified: Secondary | ICD-10-CM

## 2023-09-01 DIAGNOSIS — D638 Anemia in other chronic diseases classified elsewhere: Secondary | ICD-10-CM | POA: Insufficient documentation

## 2023-09-01 HISTORY — DX: Personal history of nicotine dependence: Z87.891

## 2023-09-01 LAB — LIPID PANEL
Cholesterol: 101 mg/dL (ref 0–200)
HDL: 45.4 mg/dL (ref >39.00)
LDL Cholesterol: 23 mg/dL (ref 0–99)
NonHDL: 55.13
Total CHOL/HDL Ratio: 2
Triglycerides: 159 mg/dL — ABNORMAL HIGH (ref 0.0–149.0)
VLDL: 31.8 mg/dL (ref 0.0–40.0)

## 2023-09-01 LAB — COMPREHENSIVE METABOLIC PANEL
ALT: 7 U/L (ref 0–53)
AST: 9 U/L (ref 0–37)
Albumin: 4 g/dL (ref 3.5–5.2)
Alkaline Phosphatase: 68 U/L (ref 39–117)
BUN: 14 mg/dL (ref 6–23)
CO2: 27 mEq/L (ref 19–32)
Calcium: 9.1 mg/dL (ref 8.4–10.5)
Chloride: 103 mEq/L (ref 96–112)
Creatinine, Ser: 1.04 mg/dL (ref 0.40–1.50)
GFR: 72.43 mL/min (ref >60.00)
Glucose, Bld: 85 mg/dL (ref 70–99)
Potassium: 3.8 mEq/L (ref 3.5–5.1)
Sodium: 142 mEq/L (ref 135–145)
Total Bilirubin: 0.4 mg/dL (ref 0.2–1.2)
Total Protein: 7.4 g/dL (ref 6.0–8.3)

## 2023-09-01 LAB — IBC + FERRITIN
Ferritin: 165.1 ng/mL (ref 22.0–322.0)
Iron: 38 ug/dL — ABNORMAL LOW (ref 42–165)
Saturation Ratios: 14.2 % — ABNORMAL LOW (ref 20.0–50.0)
TIBC: 267.4 ug/dL (ref 250.0–450.0)
Transferrin: 191 mg/dL — ABNORMAL LOW (ref 212.0–360.0)

## 2023-09-01 LAB — HEMOGLOBIN A1C: Hgb A1c MFr Bld: 6.1 % (ref 4.6–6.5)

## 2023-09-01 LAB — VITAMIN D 25 HYDROXY (VIT D DEFICIENCY, FRACTURES): VITD: 36.78 ng/mL (ref 30.00–100.00)

## 2023-09-01 MED ORDER — AMLODIPINE BESYLATE 5 MG PO TABS
5.0000 mg | ORAL_TABLET | Freq: Every day | ORAL | 3 refills | Status: DC
Start: 2023-09-01 — End: 2024-06-27

## 2023-09-01 MED ORDER — LOSARTAN POTASSIUM 25 MG PO TABS
25.0000 mg | ORAL_TABLET | Freq: Every day | ORAL | 3 refills | Status: DC
Start: 2023-09-01 — End: 2023-09-20

## 2023-09-01 NOTE — Progress Notes (Signed)
Established   Subjective:      CC:  Chief Complaint  Patient presents with   Medical Management of Chronic Issues    HPI: Jordan Arellano is a 72 y.o. male presenting on 09/01/2023 for Medical Management of Chronic Issues . 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 Last v/u with podiatry yesterday 9/25. Per their report as note not yet completed, had reassuring assessment of his foot and f/u in six months. Still with home health wound care, they come about every three days.   Tobacco abuse, quit last time he was in the hospital and has not started since. He is doing well with cessation has no desire to smoke anymore after his foot situation.   He is taking plavix 75 mg once daily and ASA 81 mg once daily.   Medication management, has this with home health as well, and they help him put together his medication and ensure he is taking the right medications daily.  Malnutrition: drinking ensure about twice daily, trying to eat more per his report.   Lower blood pressure, sometimes dizzy when getting up quickly at times. H/o low blood pressure August 83/56. HH nurse has taken him off of his blood pressure medication per pt, I am assuming per his record they d/c his olmesartan. On amlodipine 10 mg once daily. Was on benicar HC but was taken off of this today in office BP 128/58.   Last metabolic panel Lab Results  Component Value Date   GLUCOSE 85 09/01/2023   NA 142 09/01/2023   K 3.8 09/01/2023   CL 103 09/01/2023   CO2 27 09/01/2023   BUN 14 09/01/2023   CREATININE 1.04 09/01/2023   GFRNONAA 56 (L) 06/01/2023   CALCIUM 9.1 09/01/2023   PHOS 3.6 03/24/2023   PROT 7.4 09/01/2023   ALBUMIN 4.0 09/01/2023   BILITOT 0.4 09/01/2023   ALKPHOS 68 09/01/2023   AST 9 09/01/2023   ALT 7 09/01/2023   ANIONGAP 6 06/01/2023         Social history:  Relevant past medical, surgical, family and social history reviewed and  updated as indicated. Interim medical history since our last visit reviewed.  Allergies and medications reviewed and updated.  DATA REVIEWED: CHART IN EPIC     ROS: Negative unless specifically indicated above in HPI.    Current Outpatient Medications:    Accu-Chek FastClix Lancets MISC, Use to check blood sugar 2 times a day, Disp: , Rfl:    amLODipine (NORVASC) 5 MG tablet, Take 1 tablet (5 mg total) by mouth daily., Disp: 90 tablet, Rfl: 3   ascorbic acid (VITAMIN C) 500 MG tablet, Take 1 tablet (500 mg total) by mouth daily., Disp: 30 tablet, Rfl: 2   aspirin 81 MG tablet, Take 81 mg by mouth daily., Disp: , Rfl:    Blood Glucose Monitoring Suppl (ACCU-CHEK GUIDE) w/Device KIT, Use to check blood sugar 2 times a day, Disp: , Rfl:    clopidogrel (PLAVIX) 75 MG tablet, Take 1 tablet (75 mg total) by mouth daily., Disp: 30 tablet, Rfl: 5   glucose blood (ACCU-CHEK GUIDE) test strip, Use to check blood sugar 2 times a day, Disp: , Rfl:    latanoprost (XALATAN) 0.005 % ophthalmic solution, Place 1 drop into both eyes at bedtime., Disp: , Rfl:    losartan (COZAAR) 25 MG tablet, Take 1 tablet (25 mg total) by mouth daily., Disp: 90 tablet, Rfl:  3   Multiple Vitamin (MULTIVITAMIN WITH MINERALS) TABS tablet, Take 1 tablet by mouth daily., Disp: 30 tablet, Rfl: 3   mupirocin ointment (BACTROBAN) 2 %, Apply 1 Application topically 3 (three) times daily., Disp: , Rfl:    Nutritional Supplements (ENSURE PLUS HIGH PROTEIN) LIQD, Take 237 mLs by mouth in the morning, at noon, and at bedtime., Disp: 237 mL, Rfl: 12   pentoxifylline (TRENTAL) 400 MG CR tablet, Take 1 tablet (400 mg total) by mouth 3 (three) times daily with meals., Disp: 90 tablet, Rfl: 1   rosuvastatin (CRESTOR) 40 MG tablet, Take 1 tablet (40 mg total) by mouth daily., Disp: 90 tablet, Rfl: 3   SANTYL 250 UNIT/GM ointment, Apply 1 Application topically daily., Disp: , Rfl:    senna-docusate (SENOKOT-S) 8.6-50 MG tablet, Take 1  tablet by mouth at bedtime as needed for mild constipation., Disp: 30 tablet, Rfl: 0   sertraline (ZOLOFT) 100 MG tablet, Take 1 tablet (100 mg total) by mouth daily., Disp: 90 tablet, Rfl: 3   Cholecalciferol (VITAMIN D) 50 MCG (2000 UT) CAPS, Take 1 capsule (2,000 Units total) by mouth daily., Disp: 90 capsule, Rfl: 3   icosapent Ethyl (VASCEPA) 1 g capsule, Take 2 capsules (2 g total) by mouth 2 (two) times daily., Disp: 360 capsule, Rfl: 1   Iron, Ferrous Sulfate, 325 (65 Fe) MG TABS, Take 325 mg by mouth every other day., Disp: 90 tablet, Rfl: 0      Objective:    BP (!) 128/58 (BP Location: Left Arm, Patient Position: Sitting, Cuff Size: Normal)   Pulse 94   Temp 97.8 F (36.6 C) (Temporal)   Ht 6\' 1"  (1.854 m)   Wt 138 lb 12.8 oz (63 kg)   SpO2 98%   BMI 18.31 kg/m   Wt Readings from Last 3 Encounters:  09/01/23 138 lb 12.8 oz (63 kg)  08/18/23 141 lb (64 kg)  07/14/23 141 lb (64 kg)    Physical Exam Constitutional:      General: He is not in acute distress.    Appearance: Normal appearance. He is normal weight. He is not ill-appearing, toxic-appearing or diaphoretic.  Cardiovascular:     Rate and Rhythm: Normal rate and regular rhythm.  Pulmonary:     Effort: Pulmonary effort is normal.     Breath sounds: Decreased air movement present. Examination of the right-middle field reveals rhonchi. Examination of the right-lower field reveals rhonchi. Rhonchi present.  Musculoskeletal:        General: Normal range of motion.  Neurological:     General: No focal deficit present.     Mental Status: He is alert and oriented to person, place, and time. Mental status is at baseline.  Psychiatric:        Mood and Affect: Mood normal.        Behavior: Behavior normal.        Thought Content: Thought content normal.        Judgment: Judgment normal.     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 Work on diabetic diet and exercise as tolerated. Yearly foot exam, and annual eye exam.    Orders: -     Comprehensive metabolic panel -     Hemoglobin A1c  Essential hypertension Assessment & Plan: Per pt report, [facility] nurse told him to d/c losartan, however needs to be on this medication due to positive microalbumin. Restart losartan 25 mg once daily and decrease amlodipine to 5 mg once daily.   Pt advised of the following:  Continue medication as prescribed. Monitor blood pressure periodically and/or when you feel symptomatic. Goal is <130/90 on average. Ensure that you have rested for 30 minutes prior to checking your blood pressure. Record your readings and bring them to your next visit if necessary.work on a low sodium diet.  Advised to f/u in two weeks with blood pressure log.   Orders: -     Comprehensive metabolic panel -     amLODIPine Besylate; Take 1 tablet (5 mg total) by mouth daily.  Dispense: 90 tablet; Refill: 3 -     Losartan Potassium; Take 1 tablet (25 mg total) by mouth daily.  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 Monitor daily for s/s infection.  Seems to be  improving per notation.   Cessation of tobacco use in previous 12 months Assessment & Plan: Smoking cessation instruction/counseling given:  commended patient for quitting and reviewed strategies for preventing relapses   Orders: -     Ambulatory Referral for Lung Cancer Scre  Hypertriglyceridemia -     Lipid panel  Dyslipidemia -     Lipoprotein A (LPA)  Malnutrition of mild degree (HCC)  Anemia due to stage 3a chronic kidney disease (HCC) -     IBC + Ferritin  On statin therapy -     Comprehensive metabolic panel  Vitamin D deficiency -     VITAMIN D 25 Hydroxy (Vit-D Deficiency, Fractures)  Decreased breath sounds at right lung base Assessment & Plan: CXR today in office  Ddx PE vs pneumonia vs copd  Orders: -     DG Chest 2 View; Future  Increased heart rate  Positive for microalbuminuria -     Losartan Potassium; Take 1 tablet (25 mg total) by mouth daily.  Dispense: 90 tablet; Refill: 3  Encounter for immunization -     Flu Vaccine Trivalent High Dose (Fluad)  Atherosclerosis of native artery of left lower extremity with rest pain [facility]) Assessment & Plan: Continue f/u as scheduled with vascular.       Return in about 2 weeks (around 09/15/2023) for f/u blood pressure.  Mort Sawyers, MSN, APRN, FNP-C [facility]

## 2023-09-01 NOTE — Patient Instructions (Addendum)
  I want you to start taking otc Vitamin D3 2000 I/U  Decrease amlodipine to 5 mg once daily  Start losartan 25 mg once daily.   Regards,   Mort Sawyers FNP-C

## 2023-09-01 NOTE — Progress Notes (Signed)
No acute findings on chest xray however suggestive of COPD.

## 2023-09-05 ENCOUNTER — Telehealth: Payer: Self-pay | Admitting: Family

## 2023-09-05 ENCOUNTER — Other Ambulatory Visit: Payer: Self-pay | Admitting: Family

## 2023-09-05 DIAGNOSIS — E559 Vitamin D deficiency, unspecified: Secondary | ICD-10-CM

## 2023-09-05 DIAGNOSIS — D631 Anemia in chronic kidney disease: Secondary | ICD-10-CM

## 2023-09-05 MED ORDER — IRON (FERROUS SULFATE) 325 (65 FE) MG PO TABS
325.0000 mg | ORAL_TABLET | ORAL | 0 refills | Status: DC
Start: 2023-09-05 — End: 2024-06-27

## 2023-09-05 MED ORDER — VITAMIN D 50 MCG (2000 UT) PO CAPS
1.0000 | ORAL_CAPSULE | Freq: Every day | ORAL | 3 refills | Status: DC
Start: 2023-09-05 — End: 2024-06-27

## 2023-09-05 NOTE — Telephone Encounter (Signed)
Spoke with pt's sister, [name]. Please see result note.

## 2023-09-05 NOTE — Telephone Encounter (Signed)
Patient sister [name] called in to follow up on patients lab results.

## 2023-09-05 NOTE — Progress Notes (Signed)
For lab:  Can we add on CBC or is it too late?   For Jordan Arellano:  Cholesterol much improved.  Iron is pretty low, is he taking daily iron? If no I suggest he start daily.  A1c is stable for diabetes, in controlled range.

## 2023-09-05 NOTE — Assessment & Plan Note (Signed)
Continue metformin  Ordered hga1c today pending results. Work on diabetic diet and exercise as tolerated. Yearly foot exam, and annual eye exam.   

## 2023-09-05 NOTE — Assessment & Plan Note (Signed)
CXR today in office  Ddx PE vs pneumonia vs copd

## 2023-09-05 NOTE — Progress Notes (Signed)
Medications sent to mail pharmacy.  Not sure if will be covered as otc

## 2023-09-05 NOTE — Progress Notes (Signed)
If pt able can we have him come in for one more lab test?  I need to assess to make sure anemia is not worse as iron very low.

## 2023-09-05 NOTE — Assessment & Plan Note (Signed)
Smoking cessation instruction/counseling given:  commended patient for quitting and reviewed strategies for preventing relapses 

## 2023-09-05 NOTE — Assessment & Plan Note (Signed)
Pt to continue with hh wound care and also podiatry as scheduled. Monitor daily for s/s infection.  Seems to be improving per notation.

## 2023-09-05 NOTE — Addendum Note (Signed)
Addended by: Alvina Chou on: 09/05/2023 07:20 AM   Modules accepted: Orders

## 2023-09-05 NOTE — Assessment & Plan Note (Signed)
Per pt report, [facility] nurse told him to d/c losartan, however needs to be on this medication due to positive microalbumin. Restart losartan 25 mg once daily and decrease amlodipine to 5 mg once daily.   Pt advised of the following:  Continue medication as prescribed. Monitor blood pressure periodically and/or when you feel symptomatic. Goal is <130/90 on average. Ensure that you have rested for 30 minutes prior to checking your blood pressure. Record your readings and bring them to your next visit if necessary.work on a low sodium diet.  Advised to f/u in two weeks with blood pressure log.

## 2023-09-05 NOTE — Assessment & Plan Note (Signed)
Continue f/u as scheduled with vascular.

## 2023-09-07 ENCOUNTER — Other Ambulatory Visit (INDEPENDENT_AMBULATORY_CARE_PROVIDER_SITE_OTHER): Payer: Self-pay | Admitting: Nurse Practitioner

## 2023-09-07 LAB — LIPOPROTEIN A (LPA): Lipoprotein (a): 34 nmol/L (ref <75)

## 2023-09-14 ENCOUNTER — Encounter: Payer: Self-pay | Admitting: Family

## 2023-09-15 ENCOUNTER — Ambulatory Visit: Payer: 59 | Admitting: Family

## 2023-09-20 ENCOUNTER — Encounter: Payer: Self-pay | Admitting: Family

## 2023-09-20 ENCOUNTER — Ambulatory Visit: Payer: 59 | Admitting: Family

## 2023-09-20 VITALS — BP 145/80 | HR 72 | Temp 97.7°F | Ht 73.0 in | Wt 146.8 lb

## 2023-09-20 DIAGNOSIS — N1831 Chronic kidney disease, stage 3a: Secondary | ICD-10-CM | POA: Diagnosis not present

## 2023-09-20 DIAGNOSIS — D631 Anemia in chronic kidney disease: Secondary | ICD-10-CM | POA: Diagnosis not present

## 2023-09-20 DIAGNOSIS — I10 Essential (primary) hypertension: Secondary | ICD-10-CM | POA: Diagnosis not present

## 2023-09-20 LAB — IBC + FERRITIN
Ferritin: 81.9 ng/mL (ref 22.0–322.0)
Iron: 52 ug/dL (ref 42–165)
Saturation Ratios: 18.1 % — ABNORMAL LOW (ref 20.0–50.0)
TIBC: 287 ug/dL (ref 250.0–450.0)
Transferrin: 205 mg/dL — ABNORMAL LOW (ref 212.0–360.0)

## 2023-09-20 MED ORDER — LOSARTAN POTASSIUM 50 MG PO TABS
50.0000 mg | ORAL_TABLET | Freq: Every day | ORAL | 3 refills | Status: DC
Start: 2023-09-20 — End: 2024-05-11

## 2023-09-20 NOTE — Progress Notes (Signed)
Improving, continue with iron supplementation.

## 2023-09-20 NOTE — Assessment & Plan Note (Addendum)
Repeat ibc ferritin and cbc  Continue otc supplementation stable and will continue to monitor.

## 2023-09-20 NOTE — Patient Instructions (Addendum)
  Increase losartan to 50 mg once daily.   Recommendation for 2000 I/U vitamin D3 once daily.    Regards,   Mort Sawyers FNP-C

## 2023-09-20 NOTE — Assessment & Plan Note (Signed)
Increase losartan to 50 mg once daily.  Monitor blood pressure periodically for goal <130/90 Maintain low sodium diet.

## 2023-09-20 NOTE — Progress Notes (Signed)
Established Patient Office Visit  Subjective:      CC:  Chief Complaint  Patient presents with   Medical Management of Chronic Issues    HPI: Jordan Arellano is a 72 y.o. male presenting on 09/20/2023 for Medical Management of Chronic Issues . HTN: has been checking at home and average around 140/80  No headaches chest pain or sob. Taking amlodipine 5 mg once daily as well as losartan 25 mg once daily.   Vitamin D def: completed high dose vitamin D weekly and now curious what to take for otc weekly.   Anemia: some slight improvement. No increased fatigue. No blood stool , taking iron otc every other day.  Lab Results  Component Value Date   WBC 12.1 (H) 06/01/2023   HGB 8.7 (L) 06/01/2023   HCT 27.4 (L) 06/01/2023   MCV 93.2 06/01/2023   PLT 478 (H) 06/01/2023      Social history:  Relevant past medical, surgical, family and social history reviewed and updated as indicated. Interim medical history since our last visit reviewed.  Allergies and medications reviewed and updated.  DATA REVIEWED: CHART IN EPIC     ROS: Negative unless specifically indicated above in HPI.    Current Outpatient Medications:    losartan (COZAAR) 50 MG tablet, Take 1 tablet (50 mg total) by mouth daily., Disp: 90 tablet, Rfl: 3   Accu-Chek FastClix Lancets MISC, Use to check blood sugar 2 times a day, Disp: , Rfl:    amLODipine (NORVASC) 5 MG tablet, Take 1 tablet (5 mg total) by mouth daily., Disp: 90 tablet, Rfl: 3   ascorbic acid (VITAMIN C) 500 MG tablet, Take 1 tablet (500 mg total) by mouth daily., Disp: 30 tablet, Rfl: 2   aspirin 81 MG tablet, Take 81 mg by mouth daily., Disp: , Rfl:    Blood Glucose Monitoring Suppl (ACCU-CHEK GUIDE) w/Device KIT, Use to check blood sugar 2 times a day, Disp: , Rfl:    Cholecalciferol (VITAMIN D) 50 MCG (2000 UT) CAPS, Take 1 capsule (2,000 Units total) by mouth daily., Disp: 90 capsule, Rfl: 3   clopidogrel (PLAVIX) 75 MG tablet, Take 1 tablet  (75 mg total) by mouth daily., Disp: 30 tablet, Rfl: 5   glucose blood (ACCU-CHEK GUIDE) test strip, Use to check blood sugar 2 times a day, Disp: , Rfl:    icosapent Ethyl (VASCEPA) 1 g capsule, Take 2 capsules (2 g total) by mouth 2 (two) times daily., Disp: 360 capsule, Rfl: 1   Iron, Ferrous Sulfate, 325 (65 Fe) MG TABS, Take 325 mg by mouth every other day., Disp: 90 tablet, Rfl: 0   latanoprost (XALATAN) 0.005 % ophthalmic solution, Place 1 drop into both eyes at bedtime., Disp: , Rfl:    Multiple Vitamin (MULTIVITAMIN WITH MINERALS) TABS tablet, Take 1 tablet by mouth daily., Disp: 30 tablet, Rfl: 3   mupirocin ointment (BACTROBAN) 2 %, Apply 1 Application topically 3 (three) times daily., Disp: , Rfl:    Nutritional Supplements (ENSURE PLUS HIGH PROTEIN) LIQD, Take 237 mLs by mouth in the morning, at noon, and at bedtime., Disp: 237 mL, Rfl: 12   pentoxifylline (TRENTAL) 400 MG CR tablet, TAKE 1 TABLET BY MOUTH 3 TIMES DAILY WITH MEALS., Disp: 90 tablet, Rfl: 1   rosuvastatin (CRESTOR) 40 MG tablet, Take 1 tablet (40 mg total) by mouth daily., Disp: 90 tablet, Rfl: 3   SANTYL 250 UNIT/GM ointment, Apply 1 Application topically daily., Disp: , Rfl:  senna-docusate (SENOKOT-S) 8.6-50 MG tablet, Take 1 tablet by mouth at bedtime as needed for mild constipation., Disp: 30 tablet, Rfl: 0   sertraline (ZOLOFT) 100 MG tablet, Take 1 tablet (100 mg total) by mouth daily., Disp: 90 tablet, Rfl: 3      Objective:    BP (!) 145/80   Pulse 72   Temp 97.7 F (36.5 C) (Oral)   Ht 6\' 1"  (1.854 m)   Wt 146 lb 12.8 oz (66.6 kg)   SpO2 97%   BMI 19.37 kg/m   Wt Readings from Last 3 Encounters:  09/20/23 146 lb 12.8 oz (66.6 kg)  09/01/23 138 lb 12.8 oz (63 kg)  08/18/23 141 lb (64 kg)    Physical Exam Constitutional:      General: He is not in acute distress.    Appearance: Normal appearance. He is normal weight. He is not ill-appearing, toxic-appearing or diaphoretic.  Cardiovascular:      Rate and Rhythm: Normal rate.  Pulmonary:     Effort: Pulmonary effort is normal.  Musculoskeletal:        General: Normal range of motion.  Neurological:     General: No focal deficit present.     Mental Status: He is alert and oriented to person, place, and time. Mental status is at baseline.  Psychiatric:        Mood and Affect: Mood normal.        Behavior: Behavior normal.        Thought Content: Thought content normal.        Judgment: Judgment normal.           Assessment & Plan:  Essential hypertension Assessment & Plan: Increase losartan to 50 mg once daily.  Monitor blood pressure periodically for goal <130/90 Maintain low sodium diet.    Orders: -     Losartan Potassium; Take 1 tablet (50 mg total) by mouth daily.  Dispense: 90 tablet; Refill: 3  Anemia due to stage 3a chronic kidney disease (HCC) Assessment & Plan: Repeat ibc ferritin and cbc  Continue otc supplementation stable and will continue to monitor.    Orders: -     IBC + Ferritin     Return in about 3 months (around 12/21/2023) for f/u blood pressure.  Mort Sawyers, MSN, APRN, FNP-C [facility]

## 2023-11-19 ENCOUNTER — Other Ambulatory Visit: Payer: Self-pay | Admitting: Family

## 2023-11-19 ENCOUNTER — Other Ambulatory Visit (INDEPENDENT_AMBULATORY_CARE_PROVIDER_SITE_OTHER): Payer: Self-pay | Admitting: Nurse Practitioner

## 2023-11-19 DIAGNOSIS — E781 Pure hyperglyceridemia: Secondary | ICD-10-CM

## 2023-12-01 ENCOUNTER — Telehealth: Payer: Self-pay | Admitting: Family

## 2023-12-01 NOTE — Telephone Encounter (Signed)
They will need to follow up with Dr. Alberteen Spindle the podiatrist in regards to the foot and if it is turning more black then he needs to go to the ER as this could be onset of necrosis (which can mean he can loose his foot due to death of his tissue) . I am happy to have him in to discuss smoking cessation further but for the foot it's either ER (which if these symptoms are correct this is more appropriate) and if less urgent then podiatry.

## 2023-12-01 NOTE — Telephone Encounter (Signed)
Jordan Arellano (DPR signed) notified as instructed and [name] voiced understanding.[name] said she might go a different route. Pt does have appt scheduled for 12/08/2023 at 9:20 with Mort Sawyers FNP. Sending FYI to Hayden Pedro FNP.

## 2023-12-01 NOTE — Telephone Encounter (Signed)
Medication needs to [facility] in [place] at the corner of [address] patient sister would like a call back regarding this patient is still smoking again patient sister is concerned about patient foot  turning black again she would like a call back from the dr today

## 2023-12-01 NOTE — Telephone Encounter (Addendum)
Spoke with pt's sister, [name] (on dpr), about message. States she's taking care of medications with [facility], so disregard that part of the message. She wanted to schedule OV for pt concerning his foot. Offered OV today but states it will need to be at future date due to her schedule (she's pt's transportation). OV scheduled on 12/07/22 at 9:20.

## 2023-12-08 ENCOUNTER — Ambulatory Visit: Payer: 59 | Admitting: Family

## 2023-12-25 ENCOUNTER — Other Ambulatory Visit: Payer: Self-pay | Admitting: Family

## 2023-12-25 DIAGNOSIS — E1151 Type 2 diabetes mellitus with diabetic peripheral angiopathy without gangrene: Secondary | ICD-10-CM

## 2023-12-28 ENCOUNTER — Telehealth: Payer: Self-pay | Admitting: Family

## 2023-12-28 NOTE — Telephone Encounter (Signed)
Sent message to billing team to remove no show fee for DOS: 8.19.24

## 2023-12-28 NOTE — Telephone Encounter (Signed)
Copied from CRM [id number]. Topic: General - Billing Inquiry >> Dec 28, 2023 10:59 AM Sonny Dandy B wrote: Reason for CRM: Pt sister called regarding a bill she received  requesting a call back is requested . Please call [name] [phone] >> Dec 28, 2023 12:03 PM CMA Marva Panda wrote: We received this CRM but it should have been sent to [facility]. I did call sister, Glendon Axe, to provide her with the billing dept. Phone number, she states that she did already call them and they told her to call her PCP office. I let her know that I would get the message where it needed to go. She stated that her brother received a bill for a no show fee for 8/19 but she isn't sure why and her brother is also getting a bill for a recent xray and she stated that his xrays have always been covered otherwise so she wonders if it is coded correctly. I informed her that she may need to call the insurance company to ask them what's not being covered so that there is a better understanding and if a code or dx does need to be updated. Sister did ask for a call back from the office manager at [facility].

## 2024-01-03 ENCOUNTER — Other Ambulatory Visit: Payer: Self-pay | Admitting: Family

## 2024-01-03 DIAGNOSIS — E781 Pure hyperglyceridemia: Secondary | ICD-10-CM

## 2024-02-20 ENCOUNTER — Telehealth: Payer: Self-pay | Admitting: Family

## 2024-02-20 DIAGNOSIS — I10 Essential (primary) hypertension: Secondary | ICD-10-CM

## 2024-02-20 NOTE — Telephone Encounter (Signed)
 Please advise him of the following  ------------------------------------   We were given a list of the individuals that may have been affected by the discrepancy in the urine micro-albumin, and your results may have been affected.  I recommend you call the office to set up a lab only appointment to repeat the urine micro-albumin test to determine if we need any additional treatment or to just continue monitoring moving forward. All you have to do prior to coming in is drink some water as you will need to provide a urine sample. Ideally we recommend not doing any strenuous exercise two days or so prior to taking the test as it can affect the results.   If you have any further questions/concerns don't be afraid to ask!   ------------------------------------ FAQs (if he asks any Q)  What happened? We recently discovered a software error in a system at the laboratory at [facility] that examines kidney function. It is often used in the care of diabetes and high blood pressure. The test is called the uACR test. It compares the amount of a protein in urine with the amount of a waste product in urine. The laboratory software miscalculated the ratio of one to the other.  The measurements themselves were correct.  Please remember that keeping your blood sugar and blood pressure levels in the ranges discussed with your provider are important to healthy kidneys.  What did the test measure? The uACR test compares the amount of a protein in urine with the amount of a waste product in urine. The software miscalculated it. This test is one of several factors used to judge kidney function.   Has the issue been resolved? As soon as this miscalculation was discovered, the software was fixed.  Will I need to take the test again? Perhaps. We expect the calculation error won't impact most patients. If it impacts you, your provider will contact you. They may ask for the urine test to be redone. You won't be  charged for that additional test if it is needed.   Is this the only test that is used to check my kidneys? No, this is one of several tests your provider might use to watch for kidney damage related to diabetes or high blood pressure.  Tests are part of the entire package considered by your provider to give good care for diabetes and high blood pressure.  What does this mean for my treatment? This depends on the individual. Your provider can tell you more. Again, we expect this won't impact most people.  Who do I contact with questions? Contact your provider.  Am I ok? We expect the calculation error won't impact most patients. If it impacts you, your provider will contact you. They may ask for the urine test to be redone. You won't be charged for that additional test if it is needed. What should I do? We have recalculated your results, and they are available to your provider now.  We are in the process of making the updated results available in MyChart as well.  Your provider will contact you if they believe that you need to adjust your treatment plan due to the updated results.  Was I harmed by my incorrect test results? We believe the calculation error won't impact most patients. If it impacts you, your provider will contact you. They may ask for the urine test to be redone. You won't be charged for that additional test if it is needed.

## 2024-02-21 NOTE — Telephone Encounter (Signed)
 Spoke with pt's sister, [name]. She is aware of this information. States that she will get the pt in as soon as possible for this.

## 2024-03-06 ENCOUNTER — Other Ambulatory Visit: Payer: Self-pay | Admitting: Family

## 2024-03-06 DIAGNOSIS — F3342 Major depressive disorder, recurrent, in full remission: Secondary | ICD-10-CM

## 2024-03-08 ENCOUNTER — Other Ambulatory Visit (INDEPENDENT_AMBULATORY_CARE_PROVIDER_SITE_OTHER): Payer: Self-pay | Admitting: Nurse Practitioner

## 2024-03-21 ENCOUNTER — Telehealth (INDEPENDENT_AMBULATORY_CARE_PROVIDER_SITE_OTHER): Payer: Self-pay | Admitting: Vascular Surgery

## 2024-03-21 NOTE — Telephone Encounter (Signed)
 I sent in a refill of the med on 03/19/2024.  He can come in with ABIs to see me or [facility]

## 2024-03-21 NOTE — Telephone Encounter (Signed)
 Patient's sister called- States pt needs a fu ZO:XWRUE put in a year ago. Per telephone note last year states consult+ABI but pt never made appt. Please advise if this is what should be scheduled for pt. Pt sister also asked for a prescription refill on med Pentoxifyline.

## 2024-03-21 NOTE — Telephone Encounter (Signed)
 Left a detailed message on the patient sister voicemail. Please contact patient sister to scheduled his appointment

## 2024-03-22 ENCOUNTER — Other Ambulatory Visit: Payer: Self-pay

## 2024-03-22 DIAGNOSIS — I10 Essential (primary) hypertension: Secondary | ICD-10-CM

## 2024-03-22 NOTE — Telephone Encounter (Signed)
 Copied from CRM [id number]. Topic: General - Other >> Mar 22, 2024 10:19 AM Baldomero Bone wrote: Reason for CRM: Patient's brother in law, Flordia Hung, is inquiring if he can come and pick up a urine cup, take it to the patient, and return it. His wife is listed as patient's caregiver. Per CAL, no because he is not listed on patient's chart. He asked that he be added. Advised that patient would have to provide that permission. Please reach out to the patient to see if Flordia Hung can be added to assist in patient's care. Callback number for the patient is [phone]

## 2024-03-22 NOTE — Telephone Encounter (Signed)
 Pt brother in-law will be coming to pick up from front office

## 2024-03-23 LAB — MICROALBUMIN / CREATININE URINE RATIO
Creatinine, Urine: 62 mg/dL (ref 20–320)
Microalb Creat Ratio: 206 mg/g{creat} — ABNORMAL HIGH (ref <30)
Microalb, Ur: 12.8 mg/dL

## 2024-03-27 ENCOUNTER — Telehealth: Payer: Self-pay | Admitting: Acute Care

## 2024-03-27 ENCOUNTER — Other Ambulatory Visit: Payer: Self-pay | Admitting: Family

## 2024-03-27 DIAGNOSIS — Z87891 Personal history of nicotine dependence: Secondary | ICD-10-CM

## 2024-03-27 DIAGNOSIS — Z122 Encounter for screening for malignant neoplasm of respiratory organs: Secondary | ICD-10-CM

## 2024-03-27 DIAGNOSIS — E119 Type 2 diabetes mellitus without complications: Secondary | ICD-10-CM

## 2024-03-27 DIAGNOSIS — R809 Proteinuria, unspecified: Secondary | ICD-10-CM

## 2024-03-27 NOTE — Telephone Encounter (Signed)
 Lung Cancer Screening Narrative/Criteria Questionnaire (Cigarette Smokers Only- No Cigars/Pipes/vapes)   Jordan Arellano   SDMV:04/09/2024 at 12:00pm with Mathis Som        1951/12/03   LDCT: 04/10/2024 at 1:30pm at [facility]    73 y.o.   Phone: [phone]  Lung Screening Narrative (confirm age 63-77 yrs Medicare / 50-80 yrs Private pay insurance)   Insurance information:[employer] mcr   Referring Provider:Tabitha Dugal FNP   This screening involves an initial phone call with a team member from our program. It is called a shared decision making visit. The initial meeting is required by  insurance and Medicare to make sure you understand the program. This appointment takes about 15-20 minutes to complete. You will complete the screening scan at your scheduled date/time.  This scan takes about 5-10 minutes to complete. You can eat and drink normally before and after the scan.  Criteria questions for Lung Cancer Screening:   Are you a current or former smoker? Former Age began smoking: 73yo   If you are a former smoker, what year did you quit smoking? 2024(within 15 yrs)   To calculate your smoking history, I need an accurate estimate of how many packs of cigarettes you smoked per day and for how many years. (Not just the number of PPD you are now smoking)   Years smoking 61 x Packs per day 1 = Pack years 61   (at least 20 pack yrs)   (Make sure they understand that we need to know how much they have smoked in the past, not just the number of PPD they are smoking now)  Do you have a personal history of cancer?  No    Do you have a family history of cancer? No  Are you coughing up blood?  No  Have you had unexplained weight loss of 15 lbs or more in the last 6 months? No  It looks like you meet all criteria.  When would be a good time for us  to schedule you for this screening?   Additional information: N/A

## 2024-04-03 ENCOUNTER — Other Ambulatory Visit (INDEPENDENT_AMBULATORY_CARE_PROVIDER_SITE_OTHER): Payer: Self-pay | Admitting: Nurse Practitioner

## 2024-04-06 ENCOUNTER — Telehealth (INDEPENDENT_AMBULATORY_CARE_PROVIDER_SITE_OTHER): Payer: Self-pay

## 2024-04-09 ENCOUNTER — Telehealth: Payer: Self-pay | Admitting: Family

## 2024-04-09 ENCOUNTER — Encounter

## 2024-04-09 NOTE — Telephone Encounter (Signed)
 Has to be in office they'll expect an evaluation.

## 2024-04-09 NOTE — Telephone Encounter (Signed)
 LM for pt's sister, Lavonna Prader to return call.  Need to know which referral she is talking about.

## 2024-04-09 NOTE — Telephone Encounter (Signed)
 Spoke with [name] and she is aware of this information. She would like to know if this visit could be done virtually?

## 2024-04-09 NOTE — Telephone Encounter (Signed)
 Copied from CRM [id number]. Topic: Referral - Question >> Apr 09, 2024 11:13 AM Freya Jesus wrote: Reason for CRM:  Patient sister is calling regarding his referral and is requesting a call back from [facility] nurse. Call back: [phone]

## 2024-04-09 NOTE — Telephone Encounter (Signed)
 Spoke with pt's sister, [name]. She was inquiring about a referral to home health. She is wanting someone to come help with his medicines and someone to help with checking his blood sugar and BP.

## 2024-04-09 NOTE — Telephone Encounter (Signed)
 We had spoken with her semi recently maybe a few weeks ago in regards to this and advised that pt would need appt to establish documentation behind the order/referral/

## 2024-04-09 NOTE — Telephone Encounter (Signed)
 On your desk

## 2024-04-10 ENCOUNTER — Ambulatory Visit
Admission: RE | Admit: 2024-04-10 | Discharge: 2024-04-10 | Disposition: A | Discharge status: Home / Self Care | Source: Ambulatory Visit

## 2024-04-10 ENCOUNTER — Telehealth: Payer: Self-pay | Admitting: Acute Care

## 2024-04-10 ENCOUNTER — Ambulatory Visit
Admission: RE | Admit: 2024-04-10 | Discharge: 2024-04-10 | Disposition: A | Discharge status: Home / Self Care | Source: Ambulatory Visit | Attending: Family | Admitting: Family

## 2024-04-10 ENCOUNTER — Ambulatory Visit

## 2024-04-10 ENCOUNTER — Other Ambulatory Visit: Payer: Self-pay | Admitting: Family

## 2024-04-10 DIAGNOSIS — Z122 Encounter for screening for malignant neoplasm of respiratory organs: Secondary | ICD-10-CM | POA: Insufficient documentation

## 2024-04-10 DIAGNOSIS — Z87891 Personal history of nicotine dependence: Secondary | ICD-10-CM | POA: Diagnosis present

## 2024-04-10 DIAGNOSIS — E119 Type 2 diabetes mellitus without complications: Secondary | ICD-10-CM | POA: Insufficient documentation

## 2024-04-10 DIAGNOSIS — R809 Proteinuria, unspecified: Secondary | ICD-10-CM | POA: Insufficient documentation

## 2024-04-10 DIAGNOSIS — I10 Essential (primary) hypertension: Secondary | ICD-10-CM

## 2024-04-10 NOTE — Telephone Encounter (Signed)
 Returned call from [employer].  Sister, Finley Hugh, left VM to reschedule patients sdmv and LDCT.

## 2024-04-10 NOTE — Telephone Encounter (Signed)
 Called and made Jordan Arellano aware that I sent Rx to [facility] in [place]

## 2024-04-11 NOTE — Telephone Encounter (Signed)
 Called again to schedule LDCT to [phone]. No answer. Left Vm with call back number and nature of call.

## 2024-04-13 NOTE — Telephone Encounter (Signed)
 Lvmtcb

## 2024-04-18 ENCOUNTER — Telehealth: Payer: Self-pay | Admitting: Emergency Medicine

## 2024-04-18 NOTE — Telephone Encounter (Signed)
 Called and spoke to pt's sister, Lavonna Prader. We discussed the reason of her frustration. [name] was under the impression the call that was made to check for the pt's eligibility was the SDMV. [name] states the patient has had several appts and has recently receveid spam calls from "[facility]" that appear on her caller ID. Lavonna Prader states she knows these calls are not from [facility] as the information on the calls have not pertained to any health care information regarding the pt. Lavonna Prader has been skeptical to answer the calls that have come through with "[employer]" on the ID. I will report to IT as a precaution. She understands the reason the LDCT was cancelled, due to No-Show on SDMV. [name] is on pt's DPR and handles his appt information. Rescheduled SDMV and LDCT for pt. [name] is aware that if for any reason the SDMV is missed then the LDCT has to be rescheduled. [name] is aware if she or the pt have any additional questions prior to the appts to call [facility], direct number was given. Nothing further needed at this time.     Source  Alida Ape (Patient)   Subject  Alida Ape (Patient)   Topic  Appointments - Scheduling Inquiry for Clinic    Communication  Reason for CRM: Finley Hugh is calling in to make several statements regarding dissatisfaction of care regarding imaging.        She would like to dispute the No Show for the Shared Decision Visit, she states that was completed and will not hear otherwise. She states she will not be paying it, even if it is sent to an outside collection agency.        She would like to have the lung cancer screening scheduled and completed as well. States she showed up and receptionist 'wants to be fired' because she told them patient did not have appointments. Beyond that, she may desire more but majority of the call was to make a complaint (rejected formal complaint) and express how frustrated she is.        She is also upset that she is getting calls  non-stop and that patient is getting spam calls that come up with the ID of [place], making it through his scam call filter.        Caller is requesting a follow up and a voicemail if she is not abel to be reached immediately.

## 2024-04-23 ENCOUNTER — Other Ambulatory Visit (INDEPENDENT_AMBULATORY_CARE_PROVIDER_SITE_OTHER): Payer: Self-pay | Admitting: Vascular Surgery

## 2024-04-23 DIAGNOSIS — I739 Peripheral vascular disease, unspecified: Secondary | ICD-10-CM

## 2024-04-25 ENCOUNTER — Encounter (INDEPENDENT_AMBULATORY_CARE_PROVIDER_SITE_OTHER)

## 2024-04-25 ENCOUNTER — Ambulatory Visit (INDEPENDENT_AMBULATORY_CARE_PROVIDER_SITE_OTHER): Admitting: Nurse Practitioner

## 2024-04-27 ENCOUNTER — Ambulatory Visit (INDEPENDENT_AMBULATORY_CARE_PROVIDER_SITE_OTHER): Admitting: Vascular Surgery

## 2024-04-27 ENCOUNTER — Encounter (INDEPENDENT_AMBULATORY_CARE_PROVIDER_SITE_OTHER)

## 2024-05-02 ENCOUNTER — Ambulatory Visit (INDEPENDENT_AMBULATORY_CARE_PROVIDER_SITE_OTHER)

## 2024-05-02 DIAGNOSIS — Z9889 Other specified postprocedural states: Secondary | ICD-10-CM

## 2024-05-02 DIAGNOSIS — I739 Peripheral vascular disease, unspecified: Secondary | ICD-10-CM

## 2024-05-03 ENCOUNTER — Other Ambulatory Visit (INDEPENDENT_AMBULATORY_CARE_PROVIDER_SITE_OTHER): Payer: Self-pay | Admitting: Nurse Practitioner

## 2024-05-03 LAB — VAS US ABI WITH/WO TBI: Left ABI: 0.59

## 2024-05-08 NOTE — Telephone Encounter (Signed)
Tried to call pt with no answer

## 2024-05-08 NOTE — Telephone Encounter (Signed)
 Spoke with [name] pt's sister and let her know his Rx has been refilled and she states verbal understanding

## 2024-05-10 ENCOUNTER — Encounter (INDEPENDENT_AMBULATORY_CARE_PROVIDER_SITE_OTHER): Payer: Self-pay | Admitting: Nurse Practitioner

## 2024-05-10 ENCOUNTER — Ambulatory Visit (INDEPENDENT_AMBULATORY_CARE_PROVIDER_SITE_OTHER): Admitting: Nurse Practitioner

## 2024-05-10 VITALS — BP 165/73 | HR 77 | Resp 18 | Ht 73.0 in | Wt 162.8 lb

## 2024-05-10 DIAGNOSIS — Z9889 Other specified postprocedural states: Secondary | ICD-10-CM | POA: Diagnosis not present

## 2024-05-10 DIAGNOSIS — I10 Essential (primary) hypertension: Secondary | ICD-10-CM

## 2024-05-10 DIAGNOSIS — E1151 Type 2 diabetes mellitus with diabetic peripheral angiopathy without gangrene: Secondary | ICD-10-CM | POA: Diagnosis not present

## 2024-05-10 DIAGNOSIS — I739 Peripheral vascular disease, unspecified: Secondary | ICD-10-CM

## 2024-05-11 ENCOUNTER — Other Ambulatory Visit: Payer: Self-pay | Admitting: *Deleted

## 2024-05-11 ENCOUNTER — Telehealth (INDEPENDENT_AMBULATORY_CARE_PROVIDER_SITE_OTHER): Payer: Self-pay

## 2024-05-11 DIAGNOSIS — I10 Essential (primary) hypertension: Secondary | ICD-10-CM

## 2024-05-11 MED ORDER — LOSARTAN POTASSIUM 50 MG PO TABS: 50.0000 mg | ORAL_TABLET | Freq: Every day | ORAL | 1 refills | Status: DC

## 2024-05-11 NOTE — Telephone Encounter (Signed)
 Physical therapy referral has been sent to [facility]. Gasper Karst will reach out if referral is denied but if approved they will contact the patient.

## 2024-05-11 NOTE — Progress Notes (Signed)
 Subjective:    Patient ID: Jordan Arellano, male    DOB: [birth year], 73 y.o.   MRN: [id number] Chief Complaint  Patient presents with   Follow-up    Follow Up ABI    The patient is a 73 year old male who presents today for noninvasive studies.  He has a history of peripheral arterial disease with revascularization.  He also has a history of a right below-knee amputation.  Since his last visit almost a year ago he notes that he does not have any claudication, ulcerations or rest pain.  He also notes he does not walk extensively.  He notes that he has stopped smoking.  Today his noninvasive studies show a left ABI 0.59 compared to 0.82 on 07/14/2023.  He does appear to have strong monophasic waveforms however.    Review of Systems  Musculoskeletal:  Positive for gait problem.  Skin:  Negative for wound.  All other systems reviewed and are negative.      Objective:    Physical Exam Vitals reviewed.  HENT:     Head: Normocephalic.  Cardiovascular:     Rate and Rhythm: Normal rate.  Pulmonary:     Effort: Pulmonary effort is normal.  Musculoskeletal:     Right Lower Extremity: Right leg is amputated below knee.  Skin:    General: Skin is warm and dry.  Neurological:     Mental Status: He is alert and oriented to person, place, and time.  Psychiatric:        Mood and Affect: Mood normal.        Behavior: Behavior normal.        Thought Content: Thought content normal.        Judgment: Judgment normal.     BP (!) 165/73 (BP Location: Left Arm, Patient Position: Sitting, Cuff Size: Normal)   Pulse 77   Resp 18   Ht 6\' 1"  (1.854 m)   Wt 162 lb 12.8 oz (73.8 kg)   BMI 21.48 kg/m   Past Medical History:  Diagnosis Date   Basal cell carcinoma 08/11/2022   R forearm - ED&C   Depression    Foot ulcer (HCC) 12/12/2016   Hyperlipidemia    Hypertension    PAD (peripheral artery disease) (HCC) ~2007   s/p R BKA for non-healing wound   Stroke (HCC) 03/2014   MRI: Acute  nonhemorrhagic left paracentral pontine infarct. 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   TIA (transient ischemic attack) 01/2014    Social History   Socioeconomic History   Marital status: Widowed    Spouse name: Not on file   Number of children: 2   Years of education: Not on file   Highest education level: Not on file  Occupational History    Comment: Disabled  Tobacco Use   Smoking status: Some Days    Current packs/day: 15.00    Average packs/day: 15.0 packs/day for 60.0 years (900.0 ttl pk-yrs)    Types: Cigarettes   Smokeless tobacco: Never  Vaping Use   Vaping status: Never Used  Substance and Sexual Activity   Alcohol use: No   Drug use: No   Sexual activity: Not Currently    Birth control/protection: Abstinence  Other Topics Concern   Not on file  Social History Narrative   One boy youngest, killed in MVA    Social Drivers of Health   Financial Resource Strain: Low Risk  (08/18/2023)  Overall Financial Resource Strain (CARDIA)    Difficulty of Paying Living Expenses: Not hard at all  Food Insecurity: No Food Insecurity (08/18/2023)   Hunger Vital Sign    Worried About Running Out of Food in the Last Year: Never true    Ran Out of Food in the Last Year: Never true  Transportation Needs: No Transportation Needs (08/18/2023)   PRAPARE - Administrator, Civil Service (Medical): No    Lack of Transportation (Non-Medical): No  Physical Activity: Insufficiently Active (08/18/2023)   Exercise Vital Sign    Days of Exercise per Week: 3 days    Minutes of Exercise per Session: 30 min  Stress: No Stress Concern Present (08/18/2023)   [employer] of Occupational Health - Occupational Stress Questionnaire    Feeling of Stress : Not at all  Social Connections: Socially Isolated (08/18/2023)   Social Connection and Isolation Panel [NHANES]    Frequency of Communication with Friends  and Family: More than three times a week    Frequency of Social Gatherings with Friends and Family: More than three times a week    Attends Religious Services: Never    Database administrator or Organizations: No    Attends Banker Meetings: Never    Marital Status: Widowed  Intimate Partner Violence: Not At Risk (08/18/2023)   Humiliation, Afraid, Rape, and Kick questionnaire    Fear of Current or Ex-Partner: No    Emotionally Abused: No    Physically Abused: No    Sexually Abused: No    Past Surgical History:  Procedure Laterality Date   AMPUTATION TOE Left 05/21/2023   Procedure: AMPUTATION LEFT 1st & 3rd TOES;  Surgeon: Angel Barba, DPM;  Location: [facility] ORS;  Service: Podiatry;  Laterality: Left;   BELOW KNEE LEG AMPUTATION Right    FEMORAL-POPLITEAL BYPASS GRAFT Left 12/17/2016   Procedure: BYPASS LEFT FEMORAL TO BELOW POPLITEAL ARTERY USING PROPATEN GORE GRAFT;  Surgeon: Mayo Speck, MD;  Location: [facility] OR;  Service: Vascular;  Laterality: Left;   FEMORAL-POPLITEAL BYPASS GRAFT Left 09/30/2017   Procedure: LEFT LEG ANGIOGRAM,  THROMBECTOMY, FEM-POPLITEAL BYPASS GRAFT, tHROMBECTOMY PERONEAL ARTERY AND POSTERIOR TIBIAL , ENDARTERECTOMY TIBIAL/PERONEAL TRUNK WITH BOVINE PATCH ANGIOPLASTY.;  Surgeon: Arvil Lauber, MD;  Location: [facility] OR;  Service: Vascular;  Laterality: Left;   INTRAMEDULLARY (IM) NAIL INTERTROCHANTERIC Right 03/21/2023   Procedure: INTRAMEDULLARY (IM) NAIL INTERTROCHANTERIC;  Surgeon: Marlynn Singer, MD;  Location: [facility] ORS;  Service: Orthopedics;  Laterality: Right;   LOWER EXTREMITY ANGIOGRAPHY Left 12/14/2022   Procedure: Lower Extremity Angiography;  Surgeon: Jackquelyn Mass, MD;  Location: [facility] INVASIVE CV LAB;  Service: Cardiovascular;  Laterality: Left;   LOWER EXTREMITY ANGIOGRAPHY Left 05/20/2023   Procedure: Lower Extremity Angiography;  Surgeon: Celso College, MD;  Location: [facility] INVASIVE CV LAB;  Service: Cardiovascular;  Laterality: Left;   LOWER  EXTREMITY ANGIOGRAPHY Left 05/26/2023   Procedure: Lower Extremity Angiography;  Surgeon: Celso College, MD;  Location: [facility] INVASIVE CV LAB;  Service: Cardiovascular;  Laterality: Left;   LOWER EXTREMITY INTERVENTION  05/20/2023   Procedure: LOWER EXTREMITY INTERVENTION;  Surgeon: Celso College, MD;  Location: [facility] INVASIVE CV LAB;  Service: Cardiovascular;;   PERIPHERAL VASCULAR CATHETERIZATION Left 12/14/2016   Procedure: Abdominal Aortogram w/Lower Extremity;  Surgeon: Dannis Dy, MD;  Location: [facility] INVASIVE CV LAB;  Service: Cardiovascular;  Laterality: Left;   right cataract extraction     TRANSTHORACIC ECHOCARDIOGRAM  01/2014   To  evaluate possible CVA: EF 55-60%. GR 1 DD. No significant valvular lesions    Family History  Problem Relation Age of Onset   Hypertension Mother        Does not know history   Heart disease Mother    Stroke Mother    Diabetes Mother    Hypertension Father    Heart disease Father    Stroke Father    Diabetes Sister    Hypertension Sister    Heart disease Brother    Hypertension Brother     No Known Allergies     Latest Ref Rng & Units 06/01/2023    3:56 AM 05/30/2023    8:00 AM 05/29/2023    4:30 AM  CBC  WBC 4.0 - 10.5 K/uL 12.1  11.7  12.1   Hemoglobin 13.0 - 17.0 g/dL 8.7  8.1  8.0   Hematocrit 39.0 - 52.0 % 27.4  25.0  24.9   Platelets 150 - 400 K/uL 478  360  278        CMP     Component Value Date/Time   NA 142 09/01/2023 1217   NA 139 02/03/2016 0000   K 3.8 09/01/2023 1217   CL 103 09/01/2023 1217   CO2 27 09/01/2023 1217   GLUCOSE 85 09/01/2023 1217   BUN 14 09/01/2023 1217   BUN 28 (A) 02/03/2016 0000   CREATININE 1.04 09/01/2023 1217   CALCIUM  9.1 09/01/2023 1217   PROT 7.4 09/01/2023 1217   ALBUMIN  4.0 09/01/2023 1217   AST 9 09/01/2023 1217   ALT 7 09/01/2023 1217   ALKPHOS 68 09/01/2023 1217   BILITOT 0.4 09/01/2023 1217   GFR 72.43 09/01/2023 1217   GFRNONAA 56 (L) 06/01/2023 0356     VAS US  ABI  WITH/WO TBI Result Date: 05/03/2024  LOWER EXTREMITY DOPPLER STUDY Patient Name:  Rahmir Beever  Date of Exam:   05/02/2024 Medical Rec #: [id number]      Accession #:    [id number] Date of Birth: 1951/05/09      Patient Gender: M Patient Age:   35 years Exam Location:  [facility] Procedure:      VAS US  ABI WITH/WO TBI Referring Phys: Devon Fogo --------------------------------------------------------------------------------  Indications: Claudication, and peripheral artery disease. High Risk Factors: Hypertension, hyperlipidemia.  Vascular Interventions: 05/26/2023 Left PTA and tib per PTA. Left SFA and tib                         per stent                          12/14/2022 Bilateral common iliac artery kissing stents. Comparison Study: 07/14/2023 Performing Technologist: Tonie Franks RVS  Examination Guidelines: A complete evaluation includes at minimum, Doppler waveform signals and systolic blood pressure reading at the level of bilateral brachial, anterior tibial, and posterior tibial arteries, when vessel segments are accessible. Bilateral testing is considered an integral part of a complete examination. Photoelectric Plethysmograph (PPG) waveforms and toe systolic pressure readings are included as required and additional duplex testing as needed. Limited examinations for reoccurring indications may be performed as noted.  ABI Findings: +--------+------------------+-----+--------+--------+ Right   Rt Pressure (mmHg)IndexWaveformComment  +--------+------------------+-----+--------+--------+ [id number]                                     +--------+------------------+-----+--------+--------+ +--------+------------------+-----+--------+-------+  Left    Lt Pressure (mmHg)IndexWaveformComment +--------+------------------+-----+--------+-------+ [id number]                                    +--------+------------------+-----+--------+-------+ PTA     68                 0.44 biphasic        +--------+------------------+-----+--------+-------+ PERO    90                0.59 biphasic        +--------+------------------+-----+--------+-------+ +-------+-----------+-----------------+------------+-----------------+ ABI/TBIToday's ABIToday's TBI      Previous ABIPrevious TBI      +-------+-----------+-----------------+------------+-----------------+ Right  Rt BKA                      RT BKA                        +-------+-----------+-----------------+------------+-----------------+ Left   .59        Partial Amputated.82         Partial Amputated +-------+-----------+-----------------+------------+-----------------+  Left ABIs appear decreased compared to prior study on 07/14/2023.  Summary: Right: Rt BKA. Left: Resting left ankle-brachial index indicates moderate left lower extremity arterial disease. *See table(s) above for measurements and observations.  Electronically signed by Devon Fogo MD on 05/03/2024 at 4:09:58 PM.    Final        Assessment & Plan:   1. Peripheral arterial disease with history of revascularization (HCC) (Primary) Today the patient's studies have notably decreased on the left lower extremity.  However despite this he does not have any claudication symptoms, rest pain or ulceration.  I have discussed with the patient and his sister about possible angiogram given his previous history of right BKA however at this time they want to proceed conservatively.  He is advised to walk in order to try to help build collateralization.  He is asked to continue with his current medications including his baby aspirin , pentoxifylline , statin Plavix .  Per his sister's request we will try to attempt to get the patient home health PT in order to help increase his activity levels.  They will return in 3 months with noninvasive studies but are encouraged to follow-up sooner if he begins to display any of the concerning symptoms as noted  above.  2. Essential hypertension Continue antihypertensive medications as already ordered, these medications have been reviewed and there are no changes at this time.  3. DM (diabetes mellitus) with peripheral vascular complication (HCC) Continue hypoglycemic medications as already ordered, these medications have been reviewed and there are no changes at this time.  Hgb A1C to be monitored as already arranged by primary service   Current Outpatient Medications on File Prior to Visit  Medication Sig Dispense Refill   Accu-Chek FastClix Lancets MISC Use to check blood sugar 2 times a day     amLODipine  (NORVASC ) 5 MG tablet Take 1 tablet (5 mg total) by mouth daily. 90 tablet 3   ascorbic acid  (VITAMIN C ) 500 MG tablet Take 1 tablet (500 mg total) by mouth daily. 30 tablet 2   aspirin  81 MG tablet Take 81 mg by mouth daily.     Blood Glucose Monitoring Suppl (ACCU-CHEK GUIDE) w/Device KIT Use to check blood sugar 2 times a day     Cholecalciferol (VITAMIN D ) 50 MCG (2000 UT) CAPS Take 1 capsule (2,000  Units total) by mouth daily. 90 capsule 3   clopidogrel  (PLAVIX ) 75 MG tablet Take 1 tablet (75 mg total) by mouth daily. 30 tablet 5   glucose blood (ACCU-CHEK GUIDE) test strip Use to check blood sugar 2 times a day     Iron , Ferrous Sulfate , 325 (65 Fe) MG TABS Take 325 mg by mouth every other day. 90 tablet 0   latanoprost  (XALATAN ) 0.005 % ophthalmic solution Place 1 drop into both eyes at bedtime.     losartan  (COZAAR ) 50 MG tablet Take 1 tablet (50 mg total) by mouth daily. 90 tablet 3   metFORMIN  (GLUCOPHAGE ) 850 MG tablet TAKE 1 TABLET BY MOUTH TWICE  DAILY WITH MEALS 200 tablet 2   Multiple Vitamin (MULTIVITAMIN WITH MINERALS) TABS tablet Take 1 tablet by mouth daily. 30 tablet 3   mupirocin  ointment (BACTROBAN ) 2 % Apply 1 Application topically 3 (three) times daily.     Nutritional Supplements (ENSURE PLUS HIGH PROTEIN) LIQD Take 237 mLs by mouth in the morning, at noon, and at  bedtime. 237 mL 12   pentoxifylline  (TRENTAL ) 400 MG CR tablet TAKE 1 TABLET BY MOUTH THREE TIMES DAILY WITH MEALS 90 tablet 1   rosuvastatin  (CRESTOR ) 40 MG tablet Take 1 tablet (40 mg total) by mouth daily. 90 tablet 3   SANTYL 250 UNIT/GM ointment Apply 1 Application topically daily.     senna-docusate (SENOKOT-S) 8.6-50 MG tablet Take 1 tablet by mouth at bedtime as needed for mild constipation. 30 tablet 0   sertraline  (ZOLOFT ) 100 MG tablet TAKE 1 TABLET BY MOUTH DAILY 90 tablet 3   VASCEPA  1 g capsule TAKE 2 CAPSULES BY MOUTH 2 TIMES DAILY. 120 capsule 5   No current facility-administered medications on file prior to visit.    There are no Patient Instructions on file for this visit. No follow-ups on file.   Josslyn Ciolek E Michelyn Scullin, NP

## 2024-05-15 ENCOUNTER — Ambulatory Visit (INDEPENDENT_AMBULATORY_CARE_PROVIDER_SITE_OTHER): Admitting: Vascular Surgery

## 2024-05-21 ENCOUNTER — Ambulatory Visit (INDEPENDENT_AMBULATORY_CARE_PROVIDER_SITE_OTHER): Admitting: Adult Health

## 2024-05-21 ENCOUNTER — Encounter: Payer: Self-pay | Admitting: Adult Health

## 2024-05-21 ENCOUNTER — Other Ambulatory Visit: Payer: Self-pay | Admitting: *Deleted

## 2024-05-21 DIAGNOSIS — Z87891 Personal history of nicotine dependence: Secondary | ICD-10-CM

## 2024-05-21 DIAGNOSIS — Z122 Encounter for screening for malignant neoplasm of respiratory organs: Secondary | ICD-10-CM

## 2024-05-21 NOTE — Patient Instructions (Signed)

## 2024-05-21 NOTE — Progress Notes (Signed)
  Virtual Visit via Telephone Note  I connected with Jordan Arellano , 05/21/24 10:12 AM by a telemedicine application and verified that I am speaking with the correct person using two identifiers.  Location: Patient: home Provider: home   I discussed the limitations of evaluation and management by telemedicine and the availability of in person appointments. The patient expressed understanding and agreed to proceed.   Shared Decision Making Visit Lung Cancer Screening Program [phone])   Eligibility: 73 y.o. Pack Years Smoking History Calculation = 61 pack years  (# packs/per year x # years smoked) Recent History of coughing up blood  no Unexplained weight loss? no ( >Than 15 pounds within the last 6 months ) Prior History Lung / other cancer no (Diagnosis within the last 5 years already requiring surveillance chest CT Scans). Smoking Status Former Smoker Former Smokers: Years since quit: 1 year  Quit Date: 2024  Visit Components: Discussion included one or more decision making aids. YES Discussion included risk/benefits of screening. YES Discussion included potential follow up diagnostic testing for abnormal scans. YES Discussion included meaning and risk of over diagnosis. YES Discussion included meaning and risk of False Positives. YES Discussion included meaning of total radiation exposure. YES  Counseling Included: Importance of adherence to annual lung cancer LDCT screening. YES Impact of comorbidities on ability to participate in the program. YES Ability and willingness to under diagnostic treatment. YES  Smoking Cessation Counseling: Former Smokers:  Discussed the importance of maintaining cigarette abstinence. yes Diagnosis Code: Personal History of Nicotine  Dependence. W11.914 Information about tobacco cessation classes and interventions provided to patient. Yes Patient provided with ticket for LDCT Scan. yes Written Order for Lung Cancer Screening with LDCT  placed in Epic. Yes (CT Chest Lung Cancer Screening Low Dose W/O CM) [id number]  Z12.2-Screening of respiratory organs Z87.891-Personal history of nicotine  dependence   [name] 05/21/24

## 2024-05-22 ENCOUNTER — Ambulatory Visit
Admission: RE | Admit: 2024-05-22 | Discharge: 2024-05-22 | Disposition: A | Discharge status: Home / Self Care | Source: Ambulatory Visit | Attending: Acute Care | Admitting: Acute Care

## 2024-05-22 DIAGNOSIS — Z87891 Personal history of nicotine dependence: Secondary | ICD-10-CM | POA: Insufficient documentation

## 2024-05-22 DIAGNOSIS — Z122 Encounter for screening for malignant neoplasm of respiratory organs: Secondary | ICD-10-CM | POA: Insufficient documentation

## 2024-06-05 ENCOUNTER — Other Ambulatory Visit: Payer: Self-pay | Admitting: Acute Care

## 2024-06-05 DIAGNOSIS — Z122 Encounter for screening for malignant neoplasm of respiratory organs: Secondary | ICD-10-CM

## 2024-06-05 DIAGNOSIS — Z87891 Personal history of nicotine dependence: Secondary | ICD-10-CM

## 2024-06-28 ENCOUNTER — Ambulatory Visit (INDEPENDENT_AMBULATORY_CARE_PROVIDER_SITE_OTHER)

## 2024-06-28 ENCOUNTER — Telehealth: Payer: Self-pay

## 2024-06-28 VITALS — BP 118/80 | HR 77 | Temp 98.1°F | Ht 73.0 in | Wt 161.0 lb

## 2024-06-28 DIAGNOSIS — E782 Mixed hyperlipidemia: Secondary | ICD-10-CM

## 2024-06-28 DIAGNOSIS — Z7984 Long term (current) use of oral hypoglycemic drugs: Secondary | ICD-10-CM

## 2024-06-28 DIAGNOSIS — F3340 Major depressive disorder, recurrent, in remission, unspecified: Secondary | ICD-10-CM

## 2024-06-28 DIAGNOSIS — F3342 Major depressive disorder, recurrent, in full remission: Secondary | ICD-10-CM

## 2024-06-28 DIAGNOSIS — E118 Type 2 diabetes mellitus with unspecified complications: Secondary | ICD-10-CM

## 2024-06-28 DIAGNOSIS — E559 Vitamin D deficiency, unspecified: Secondary | ICD-10-CM

## 2024-06-28 DIAGNOSIS — N1831 Chronic kidney disease, stage 3a: Secondary | ICD-10-CM

## 2024-06-28 DIAGNOSIS — N200 Calculus of kidney: Secondary | ICD-10-CM

## 2024-06-28 DIAGNOSIS — I5030 Unspecified diastolic (congestive) heart failure: Secondary | ICD-10-CM

## 2024-06-28 DIAGNOSIS — E1151 Type 2 diabetes mellitus with diabetic peripheral angiopathy without gangrene: Secondary | ICD-10-CM | POA: Diagnosis not present

## 2024-06-28 DIAGNOSIS — D638 Anemia in other chronic diseases classified elsewhere: Secondary | ICD-10-CM | POA: Diagnosis not present

## 2024-06-28 DIAGNOSIS — I10 Essential (primary) hypertension: Secondary | ICD-10-CM

## 2024-06-28 DIAGNOSIS — Z9189 Other specified personal risk factors, not elsewhere classified: Secondary | ICD-10-CM

## 2024-06-28 DIAGNOSIS — N3001 Acute cystitis with hematuria: Secondary | ICD-10-CM

## 2024-06-28 DIAGNOSIS — Z79899 Other long term (current) drug therapy: Secondary | ICD-10-CM

## 2024-06-28 DIAGNOSIS — K8020 Calculus of gallbladder without cholecystitis without obstruction: Secondary | ICD-10-CM

## 2024-06-28 DIAGNOSIS — E781 Pure hyperglyceridemia: Secondary | ICD-10-CM

## 2024-06-28 DIAGNOSIS — I701 Atherosclerosis of renal artery: Secondary | ICD-10-CM

## 2024-06-28 DIAGNOSIS — I739 Peripheral vascular disease, unspecified: Secondary | ICD-10-CM

## 2024-06-28 DIAGNOSIS — I2510 Atherosclerotic heart disease of native coronary artery without angina pectoris: Secondary | ICD-10-CM

## 2024-06-28 DIAGNOSIS — Z89511 Acquired absence of right leg below knee: Secondary | ICD-10-CM

## 2024-06-28 LAB — URINALYSIS, ROUTINE W REFLEX MICROSCOPIC
Bilirubin Urine: NEGATIVE
Ketones, ur: NEGATIVE
Nitrite: NEGATIVE
Specific Gravity, Urine: 1.025 (ref 1.000–1.030)
Total Protein, Urine: 100 — AB
Urine Glucose: NEGATIVE
Urobilinogen, UA: 0.2 (ref 0.0–1.0)
pH: 6 (ref 5.0–8.0)

## 2024-06-28 LAB — MICROALBUMIN / CREATININE URINE RATIO
Creatinine,U: 101.9 mg/dL
Microalb Creat Ratio: 400 mg/g — ABNORMAL HIGH (ref 0.0–30.0)
Microalb, Ur: 40.8 mg/dL — ABNORMAL HIGH (ref 0.0–1.9)

## 2024-06-28 MED ORDER — ICOSAPENT ETHYL 1 G PO CAPS: 2.0000 g | ORAL_CAPSULE | Freq: Two times a day (BID) | ORAL | 3 refills | Status: AC

## 2024-06-28 MED ORDER — METFORMIN HCL 850 MG PO TABS: 850.0000 mg | ORAL_TABLET | Freq: Two times a day (BID) | ORAL | 3 refills | Status: AC

## 2024-06-28 MED ORDER — LOSARTAN POTASSIUM 50 MG PO TABS: 50.0000 mg | ORAL_TABLET | Freq: Every day | ORAL | 1 refills | Status: DC

## 2024-06-28 MED ORDER — AMLODIPINE BESYLATE 5 MG PO TABS: 5.0000 mg | ORAL_TABLET | Freq: Every day | ORAL | 3 refills | Status: AC

## 2024-06-28 MED ORDER — ROSUVASTATIN CALCIUM 40 MG PO TABS: 40.0000 mg | ORAL_TABLET | Freq: Every day | ORAL | 3 refills | Status: AC

## 2024-06-28 MED ORDER — SERTRALINE HCL 100 MG PO TABS: 100.0000 mg | ORAL_TABLET | Freq: Every day | ORAL | 3 refills | Status: AC

## 2024-06-28 MED ORDER — VITAMIN D 50 MCG (2000 UT) PO CAPS: 1.0000 | ORAL_CAPSULE | Freq: Every day | ORAL | 3 refills | Status: AC

## 2024-06-28 MED ORDER — IRON (FERROUS SULFATE) 325 (65 FE) MG PO TABS: 325.0000 mg | ORAL_TABLET | ORAL | 0 refills | Status: AC

## 2024-06-28 NOTE — Assessment & Plan Note (Signed)
 Per US  completed 2018, 90%, CKD improved. GFR, Creatinine stable.  Repeat CMP, consider referral to nephrology in the future if worsening renal function.

## 2024-06-28 NOTE — Assessment & Plan Note (Signed)
 Asymptomatic gallstones identified on recent CT scan 05/22/24. Advised referral to general surgery for elective cholecystectomy. Patient declined.  He plans on monitoring symptoms and will let us  know if he wants referral to general surgery in the future.

## 2024-06-28 NOTE — Assessment & Plan Note (Signed)
 B/L nephrolithiasis, non obstructing evident on low dose CT lungs from 05/22/24.  Advised to monitor for hematuria or back pain. Recommend increased hydration to prevent complications.  Check UA, will refer to urology if hematuria or if symptoms of nephrolithiasis occurs in the future.

## 2024-06-28 NOTE — Patient Instructions (Addendum)
-   If you are interested in the shingles vaccine series (Shingrix), call your insurance or pharmacy to check on coverage and location it must be given.  If affordable - you can schedule it here or at your pharmacy depending on coverage.   -  Let us  know about pneumonia vaccine.   - Your CT lungs from 05/2024 showed gallstone and kidney stones.  - For gallstones: I can refer you to general surgeon for evaluation to see if that can be removed. If you choose to monitor for now and let me know when you are ready to be referred to general surgery please let me know. Usually treatment for this is to remove gallbladder and is done on elective basis.   - For kidney stones: Please make sure you drink about 8 glasses of water daily. If you develop lower back pain, reduced urination, blood in urine I will recommend urology evaluation. I am checking your urine today, if there is presence of blood in the urine I will refer you to an urologist.   - I will reach out to [employer] for referral on getting new prosthetic.

## 2024-06-28 NOTE — Assessment & Plan Note (Signed)
 Check A1c. Continue Metformin  850 mg BID. Counseled on diabetic diet.  Counseled on preventative measures including annual diabetic eye exam, updating pneumonia, shingles immunization. Patient has upcoming eye appointment in 07/2024. Declines updating immunization today.

## 2024-06-28 NOTE — Assessment & Plan Note (Signed)
 Evident on low dose CT lungs from 05/22/24. Is not DAPT, high intensity statin, continue. Recommend cardiology referral and risk factor management.

## 2024-06-28 NOTE — Assessment & Plan Note (Signed)
 Chronic, goal LDL <70. Check lipid panel today. Continue rosuvastatin  40 mg nightly, vascepa  2g BID.

## 2024-06-28 NOTE — Assessment & Plan Note (Signed)
 Denies SI/HI, symptoms stable on Zoloft  100 mg, continue.

## 2024-06-28 NOTE — Telephone Encounter (Signed)
 Fwd to [employer]  Copied from [employer] #[id number]. Topic: Clinical - Medication Question >> Jun 28, 2024  2:24 PM Chasity T wrote: Reason for CRM: Joe from [facility] regarding patient. Sent over a fax 07/17 regarding adding a stat medication. Please contact back at [phone]

## 2024-06-28 NOTE — Assessment & Plan Note (Signed)
 Evident on echocardiogram from 2015. Patient is euvolemic today. BP within goal as well. I recommend cardiology referral given cardiovascular co morbidities. Will benefit from repeat echo to f/u.

## 2024-06-28 NOTE — Assessment & Plan Note (Addendum)
Continue f/u with vascular surgery.   

## 2024-06-28 NOTE — Assessment & Plan Note (Signed)
 Wearing hearing aids, continue f/u with audiology.

## 2024-06-28 NOTE — Assessment & Plan Note (Signed)
-   Managed with every other day oral iron  supplements. - Order CBC, B12 and iron  panel. Management pending results.  - Consider referral to hematology for iron  infusion if iron  levels remain low.

## 2024-06-28 NOTE — Progress Notes (Signed)
 Established Patient Office Visit TOC from Jordan Dugal, NP    Subjective  Patient ID: Jordan Arellano, male    DOB: October 06, 1951  Age: 73 y.o. MRN: [id number]  Chief Complaint  Patient presents with   Establish Care    He  has a past medical history of Acute hematogenous osteomyelitis of left foot (HCC) (05/19/2023), Basal cell carcinoma (08/11/2022), Cessation of tobacco use in previous 12 months (09/01/2023), Depression, Foot ulcer (HCC) (12/12/2016), Hyperlipidemia, Hypertension, PAD (peripheral artery disease) (HCC) (~2007), Protein-calorie malnutrition, severe (05/24/2023), Stroke (HCC) (03/2014), and TIA (transient ischemic attack) (01/2014).  HPI Discussed the use of AI scribe software for clinical note transcription with the patient, who gave verbal consent to proceed.  [employer] for right lower leg prosthetic, requisting referral   History of Present Illness Est with pulmunology:  PVD, S/P BKA right, Left foot 1, 3 toes distally amputee. Est with [facility] and vascular surgery at [facility] (due for f/u in 08/10/24 for further studies). Previous PCP Jordan Patrick, FNP HTN: Losartan  50 mg, Amlodipine  5 mg CKD 3a, anemia of chronic disease.  [facility] wound care, podiatry 7/8 for debridement of .  06/04/24: Coronary artery 3 vessel disease. Needs repeat ct lungs annualy. Nephrolithiasis: B/L non obs on 04/10/24 ultrasound. CT chest 6/17: right 9 mm left 2 mm renal stone, non obs.  Emphysema: on lung CT from 05/22/24/done pft? Pulm [facility] on 05/21/24.  dysfunction [facility] referral   Jordan Arellano is a 73 year old male with diabetes, pvd, a right below-knee amputation, mood disorder, hyperlipidemia, CKD, iron  deficiency, HTN  who presents to establish care. He is accompanied by his brother-in-law, Jordan Arellano.  - DM, microalbuminuria, hyperlipidemia: For diabetes and is currently taking metformin  850 mg twice a day. He has not been checking his blood sugar regularly. On Plavix , Aspirin ,  crestor  40 mg, vascepa  2 gm BID. Eye care through [facility]. Reports due for eye exam in 07/2024. Due for pneumonia, shingles immunization.   - CHFpEF: Echo 2015 EF 55-60% (grade 1 diastolic dysfunction). Patient not established with cardiologist.   - HTN: On amlodipine  5 mg and losartan  50  for hypertension, with the losartan  dose recently increased from 25 mg to 50 mg due to microalbuminuria by previous PCP.   - Iron  deficiency, anemia: Takes ferrous sulphate 365 mg every other day. Denies constipation, s/e. He is not taking medication for constipation despite having a stool softener prescribed and denies constipation issues.  - Has a h/o vitamin D  deficiency and is on daily vitamin D  supplement (2000 units).  and Zoloft  for anxiety and depression. No thoughts of self-harm.  - R below knee amputation:  PVD, Left multiple toes amputation: He has a right below-knee amputation and uses a prosthetic from [employer], which he finds uncomfortable. He used to go to  [employer] for prosthetic services which is now acquired by [employer]. He is following up with [facility] pod[facility]ry for concerns regarding his left foot. Established with [facility] vascular surgery, has appointment coming in 9/5.   -  Low dose CT lungs done for lung cancer screening in 05/22/2024 with following findings:  Lungs: RADS-2  Three-vessel coronary atherosclerosis Cholelithiasis Nonobstructing bilateral nephrolithiasis Aortic Atherosclerosis Patient denies RUQ pain. Is not interested in seeing surgeon for elective cholecystectomy.   - Mood: On Sertraline  100 mg once a day for MDD, no SI/HI.    ROS As per HPI    Objective:     BP 118/80 (BP Location: Right Arm, Patient Position: Sitting, Cuff  Size: Small)   Pulse 77   Temp 98.1 F (36.7 C) (Oral)   Ht 6' 1 (1.854 m)   Wt 161 lb (73 kg)   SpO2 97%   BMI 21.24 kg/m      06/28/2024    2:18 PM 08/18/2023    1:37 PM 04/07/2022    2:39 PM  Depression screen PHQ 2/9   Decreased Interest 1 0 0  Down, Depressed, Hopeless 1 0 0  PHQ - 2 Score 2 0 0  Altered sleeping 1    Tired, decreased energy 1    Change in appetite 1    Feeling bad or failure about yourself  1    Trouble concentrating 1    Moving slowly or fidgety/restless 1    Suicidal thoughts 1    PHQ-9 Score 9        06/28/2024    2:18 PM 05/26/2021    1:10 PM 06/27/2020   11:23 AM 10/30/2019   10:15 AM  GAD 7 : Generalized Anxiety Score  Nervous, Anxious, on Edge 1 0 0 0  Control/stop worrying 1 0 0 0  Worry too much - different things 1 0 0 0  Trouble relaxing 1 0 0 0  Restless 1 0 0 0  Easily annoyed or irritable 1 0 0 0  Afraid - awful might happen 1 0 0 0  Total GAD 7 Score 7 0 0 0  Anxiety Difficulty  Not difficult at all        06/28/2024    2:18 PM 08/18/2023    1:37 PM 04/07/2022    2:39 PM  Depression screen PHQ 2/9  Decreased Interest 1 0 0  Down, Depressed, Hopeless 1 0 0  PHQ - 2 Score 2 0 0  Altered sleeping 1    Tired, decreased energy 1    Change in appetite 1    Feeling bad or failure about yourself  1    Trouble concentrating 1    Moving slowly or fidgety/restless 1    Suicidal thoughts 1    PHQ-9 Score 9        06/28/2024    2:18 PM 05/26/2021    1:10 PM 06/27/2020   11:23 AM 10/30/2019   10:15 AM  GAD 7 : Generalized Anxiety Score  Nervous, Anxious, on Edge 1 0 0 0  Control/stop worrying 1 0 0 0  Worry too much - different things 1 0 0 0  Trouble relaxing 1 0 0 0  Restless 1 0 0 0  Easily annoyed or irritable 1 0 0 0  Afraid - awful might happen 1 0 0 0  Total GAD 7 Score 7 0 0 0  Anxiety Difficulty  Not difficult at all     SDOH Screenings   Food Insecurity: No Food Insecurity (08/18/2023)  Housing: Unknown (03/07/2024)   Received from [facility]  Transportation Needs: No Transportation Needs (08/18/2023)  Utilities: Not At Risk (08/18/2023)  Alcohol Screen: Low Risk  (08/18/2023)  Depression (PHQ2-9): Medium Risk (06/28/2024)   Financial Resource Strain: Low Risk  (08/18/2023)  Physical Activity: Insufficiently Active (08/18/2023)  Social Connections: Socially Isolated (08/18/2023)  Stress: No Stress Concern Present (08/18/2023)  Tobacco Use: High Risk (06/28/2024)  Health Literacy: Adequate Health Literacy (08/18/2023)     Physical Exam Constitutional:      Appearance: He is not toxic-appearing.  HENT:     Head: Normocephalic and atraumatic.     Right Ear: Tympanic membrane  normal.     Left Ear: Tympanic membrane normal.     Ears:     Comments: Bilateral hearing aids in place     Mouth/Throat:     Mouth: Mucous membranes are moist.  Eyes:     General: No scleral icterus. Cardiovascular:     Rate and Rhythm: Normal rate.  Abdominal:     General: Abdomen is protuberant.     Palpations: Abdomen is soft.     Tenderness: There is no guarding.  Musculoskeletal:     Cervical back: Neck supple. No rigidity.     Right lower leg: No edema.     Left lower leg: No edema.     Right Lower Extremity: Right leg is amputated below knee.  Feet:     Comments: Left 3 toe nails s/p amputation, no active wound  Neurological:     Mental Status: He is oriented to person, place, and time.  Psychiatric:        Mood and Affect: Mood normal.        No results found for any visits on 06/28/24.  The ASCVD Risk score (Arnett DK, et al., 2019) failed to calculate for the following reasons:   Risk score cannot be calculated because patient has a medical history suggesting prior/existing ASCVD     Assessment & Plan:   Essential hypertension Assessment & Plan: BP within goal today.  Continue Amlodipine  5 mg, Losartan  50 mg.  Consider increasing losartan  due to proteinuria however will have to monitor renal function closely given h/o left renal artery stenosis.   Orders: -     amLODIPine  Besylate; Take 1 tablet (5 mg total) by mouth daily.  Dispense: 90 tablet; Refill: 3 -     Losartan  Potassium; Take 1 tablet (50 mg  total) by mouth daily.  Dispense: 90 tablet; Refill: 1 -     Comprehensive metabolic panel with GFR  DM (diabetes mellitus) with peripheral vascular complication [facility]) Assessment & Plan: Continue f/u with vascular surgery.   Orders: -     metFORMIN  HCl; Take 1 tablet (850 mg total) by mouth 2 (two) times daily with a meal.  Dispense: 180 tablet; Refill: 3 -     Hemoglobin A1c -     Comprehensive metabolic panel with GFR -     Microalbumin / creatinine urine ratio  Mixed hyperlipidemia Assessment & Plan: Chronic, goal LDL <70. Check lipid panel today. Continue rosuvastatin  40 mg nightly, vascepa  2g BID.   Orders: -     Rosuvastatin  Calcium ; Take 1 tablet (40 mg total) by mouth daily.  Dispense: 90 tablet; Refill: 3 -     Icosapent  Ethyl; Take 2 capsules (2 g total) by mouth 2 (two) times daily.  Dispense: 360 capsule; Refill: 3 -     Lipid panel -     Comprehensive metabolic panel with GFR  Vitamin D  deficiency Assessment & Plan: On daily vitamin D  2000 units. Plan to check vitamin D  levels for adequacy.  Orders: -     Vitamin D ; Take 1 capsule (2,000 Units total) by mouth daily.  Dispense: 90 capsule; Refill: 3 -     VITAMIN D  25 Hydroxy (Vit-D Deficiency, Fractures)  Recurrent major depressive disorder, in full remission (HCC) Assessment & Plan: Denies SI/HI, symptoms stable on Zoloft  100 mg, continue.  Orders: -     Sertraline  HCl; Take 1 tablet (100 mg total) by mouth daily.  Dispense: 90 tablet; Refill: 3  Atherosclerosis of native coronary artery  of native heart without angina pectoris Assessment & Plan: Evident on low dose CT lungs from 05/22/24. Is not DAPT, high intensity statin, continue. Recommend cardiology referral and risk factor management.    Bilateral nephrolithiasis Assessment & Plan: B/L nephrolithiasis, non obstructing evident on low dose CT lungs from 05/22/24.  Advised to monitor for hematuria or back pain. Recommend increased hydration to prevent  complications.  Check UA, will refer to urology if hematuria or if symptoms of nephrolithiasis occurs in the future.   Orders: -     Urinalysis  Status post below-knee amputation of right lower extremity (HCC) Assessment & Plan: S/P Right Below Knee Amputation from PVD, DM complication.  Current prosthetic from [employer] causing discomfort. Called [employer] to find out it is now acquired by [employer]. Patient counseled to reach out to [employer] prosthetic to evaluate for ongoing discomfort related to current prosthetic. He verbalizes understanding.    Anemia of chronic disease Assessment & Plan: - Managed with every other day oral iron  supplements. - Order CBC, B12 and iron  panel. Management pending results.  - Consider referral to hematology for iron  infusion if iron  levels remain low.   Orders: -     Iron  (Ferrous Sulfate ); Take 325 mg by mouth every other day.  Dispense: 90 tablet; Refill: 0 -     CBC -     Iron , TIBC and Ferritin Panel -     Vitamin B12  At risk for constipation Assessment & Plan: Denies constipation problem. He is on iron  supplement currently which can contribute to constipation. Has prn Senokot-S at prescribed to him which he is not needing.    Recurrent major depressive disorder, in remission [facility]) Assessment & Plan: Denies SI/HI, symptoms stable on Zoloft  100 mg, continue.   Left renal artery stenosis [facility]) Assessment & Plan: Per US  completed 2018, 90%, CKD improved. GFR, Creatinine stable.  Repeat CMP, consider referral to nephrology in the future if worsening renal function.     Heart failure with preserved ejection fraction, unspecified HF chronicity (HCC) Assessment & Plan: Evident on echocardiogram from 2015. Patient is euvolemic today. BP within goal as well. I recommend cardiology referral given cardiovascular co morbidities. Will benefit from repeat echo to f/u.   Orders: -     Ambulatory referral to Cardiology  Type II diabetes mellitus with  complication (HCC) Assessment & Plan: Check A1c. Continue Metformin  850 mg BID. Counseled on diabetic diet.  Counseled on preventative measures including annual diabetic eye exam, updating pneumonia, shingles immunization. Patient has upcoming eye appointment in 07/2024. Declines updating immunization today.    Calculus of gallbladder without cholecystitis without obstruction Assessment & Plan: Asymptomatic gallstones identified on recent CT scan 05/22/24. Advised referral to general surgery for elective cholecystectomy. Patient declined.  He plans on monitoring symptoms and will let us  know if he wants referral to general surgery in the future.     I spent 65 minutes on the day of this face-to-face encounter reviewing the patient's medical and surgical history, medications, ongoing concerns, and reviewing the assessment and plan with the patient and his brother in law. This time also included counseling the patient on their health conditions and management options. Additionally, I spent time post-visit ordering and reviewing diagnostics and therapeutics with the patient.   Return in about 3 months (around 09/28/2024) for Chronic follow up .   Luke Shade, MD

## 2024-06-28 NOTE — Assessment & Plan Note (Signed)
 On daily vitamin D  2000 units. Plan to check vitamin D  levels for adequacy.

## 2024-06-28 NOTE — Assessment & Plan Note (Addendum)
 BP within goal today.  Continue Amlodipine  5 mg, Losartan  50 mg.  Consider increasing losartan  due to proteinuria however will have to monitor renal function closely given h/o left renal artery stenosis.

## 2024-06-28 NOTE — Assessment & Plan Note (Signed)
 S/P Right Below Knee Amputation from PVD, DM complication.  Current prosthetic from [employer] causing discomfort. Called [employer] to find out it is now acquired by [employer]. Patient counseled to reach out to [employer] prosthetic to evaluate for ongoing discomfort related to current prosthetic. He verbalizes understanding.

## 2024-06-28 NOTE — Assessment & Plan Note (Signed)
 Denies constipation problem. He is on iron  supplement currently which can contribute to constipation. Has prn Senokot-S at prescribed to him which he is not needing.

## 2024-06-29 ENCOUNTER — Ambulatory Visit: Payer: Self-pay

## 2024-06-29 DIAGNOSIS — R319 Hematuria, unspecified: Secondary | ICD-10-CM

## 2024-06-29 DIAGNOSIS — N3001 Acute cystitis with hematuria: Secondary | ICD-10-CM | POA: Insufficient documentation

## 2024-06-29 DIAGNOSIS — N182 Chronic kidney disease, stage 2 (mild): Secondary | ICD-10-CM

## 2024-06-29 DIAGNOSIS — R809 Proteinuria, unspecified: Secondary | ICD-10-CM

## 2024-06-29 LAB — COMPREHENSIVE METABOLIC PANEL WITH GFR
ALT: 12 U/L (ref 0–53)
AST: 15 U/L (ref 0–37)
Albumin: 4.5 g/dL (ref 3.5–5.2)
Alkaline Phosphatase: 57 U/L (ref 39–117)
BUN: 21 mg/dL (ref 6–23)
CO2: 26 meq/L (ref 19–32)
Calcium: 9.6 mg/dL (ref 8.4–10.5)
Chloride: 104 meq/L (ref 96–112)
Creatinine, Ser: 1.2 mg/dL (ref 0.40–1.50)
GFR: 60.65 mL/min (ref >60.00)
Glucose, Bld: 114 mg/dL — ABNORMAL HIGH (ref 70–99)
Potassium: 4.5 meq/L (ref 3.5–5.1)
Sodium: 140 meq/L (ref 135–145)
Total Bilirubin: 0.4 mg/dL (ref 0.2–1.2)
Total Protein: 7.3 g/dL (ref 6.0–8.3)

## 2024-06-29 LAB — CBC
HCT: 38.2 % — ABNORMAL LOW (ref 39.0–52.0)
Hemoglobin: 12.6 g/dL — ABNORMAL LOW (ref 13.0–17.0)
MCHC: 32.9 g/dL (ref 30.0–36.0)
MCV: 91.2 fl (ref 78.0–100.0)
Platelets: 301 K/uL (ref 150.0–400.0)
RBC: 4.19 Mil/uL — ABNORMAL LOW (ref 4.22–5.81)
RDW: 15.6 % — ABNORMAL HIGH (ref 11.5–15.5)
WBC: 9.2 K/uL (ref 4.0–10.5)

## 2024-06-29 LAB — LIPID PANEL
Cholesterol: 122 mg/dL (ref 0–200)
HDL: 40.9 mg/dL (ref >39.00)
LDL Cholesterol: 36 mg/dL (ref 0–99)
NonHDL: 81.5
Total CHOL/HDL Ratio: 3
Triglycerides: 226 mg/dL — ABNORMAL HIGH (ref 0.0–149.0)
VLDL: 45.2 mg/dL — ABNORMAL HIGH (ref 0.0–40.0)

## 2024-06-29 LAB — IRON,TIBC AND FERRITIN PANEL
%SAT: 21 % (ref 20–48)
Ferritin: 77 ng/mL (ref 24–380)
Iron: 65 ug/dL (ref 50–180)
TIBC: 304 ug/dL (ref 250–425)

## 2024-06-29 LAB — HEMOGLOBIN A1C: Hgb A1c MFr Bld: 6.9 % — ABNORMAL HIGH (ref 4.6–6.5)

## 2024-06-29 LAB — VITAMIN B12: Vitamin B-12: 261 pg/mL (ref 211–911)

## 2024-06-29 LAB — VITAMIN D 25 HYDROXY (VIT D DEFICIENCY, FRACTURES): VITD: 19.71 ng/mL — ABNORMAL LOW (ref 30.00–100.00)

## 2024-06-29 MED ORDER — SULFAMETHOXAZOLE-TRIMETHOPRIM 800-160 MG PO TABS: 1.0000 | ORAL_TABLET | Freq: Two times a day (BID) | ORAL | 0 refills | Status: DC

## 2024-07-02 ENCOUNTER — Telehealth: Payer: Self-pay

## 2024-07-02 DIAGNOSIS — N3001 Acute cystitis with hematuria: Secondary | ICD-10-CM

## 2024-07-02 MED ORDER — SULFAMETHOXAZOLE-TRIMETHOPRIM 800-160 MG PO TABS: 1.0000 | ORAL_TABLET | Freq: Two times a day (BID) | ORAL | 0 refills | Status: AC

## 2024-07-02 MED ORDER — VITAMIN D (ERGOCALCIFEROL) 1.25 MG (50000 UNIT) PO CAPS: 50000.0000 [IU] | ORAL_CAPSULE | ORAL | 0 refills | Status: DC

## 2024-07-02 NOTE — Addendum Note (Signed)
 Addended by: ANICE BELT on: 07/02/2024 10:57 AM   Modules accepted: Orders

## 2024-07-02 NOTE — Telephone Encounter (Signed)
 Yes.   Thank you,  Luke Shade, MD

## 2024-07-02 NOTE — Addendum Note (Signed)
 Addended by: Oona Trammel on: 07/02/2024 04:43 PM   Modules accepted: Orders

## 2024-07-02 NOTE — Addendum Note (Signed)
 Addended by: Jonalyn Sedlak on: 07/02/2024 04:06 PM   Modules accepted: Orders

## 2024-07-02 NOTE — Telephone Encounter (Signed)
 Copied from CRM [id number]. Topic: Clinical - Prescription Issue >> Jul 02, 2024  9:52 AM Ivette P wrote: Reason for CRM: pt sister [name] called in to notify medication was not at [facility]. Checked medication sulfamethoxazole -trimethoprim  (BACTRIM  DS) 800-160 MG tablet  and was [facility].   Requesting to send to [facility] [phone] [address] [place], [place] [address]

## 2024-07-02 NOTE — Progress Notes (Signed)
 Please call the patient and update him I reviewed rest of his lab results and have following recommendations:  - Low vitamin D : Recommend starting vitamin D  supplement once a week for 3 months. Prescription sent. After 3 months recommend repeat vitamin D  level, lab ordered.  - Kidney and liver function stable.  - Hba1c elevated to 6.9% compared to 10 months ago. Make sure he takes his Metformin  850 mg twice a day. I do not recommend changing his medication at this time. I do recommend repeating A1c in 3 months.  - CBC shows significant improvement in hemoglobin. Recommend healthy nutrient rich diet (eg- spinach, kale, whole grains, fish, beans, poultry). I will continue to monitor this.   Thank you,  Luke Shade, MD

## 2024-07-02 NOTE — Telephone Encounter (Signed)
 Prescription has been corrected and sent to the correct pharmacy.

## 2024-07-09 ENCOUNTER — Other Ambulatory Visit

## 2024-07-10 ENCOUNTER — Other Ambulatory Visit

## 2024-07-10 LAB — URINALYSIS, ROUTINE W REFLEX MICROSCOPIC
Bilirubin Urine: NEGATIVE
Ketones, ur: NEGATIVE
Nitrite: NEGATIVE
Specific Gravity, Urine: 1.025 (ref 1.000–1.030)
Total Protein, Urine: 100 — AB
Urine Glucose: NEGATIVE
Urobilinogen, UA: 0.2 (ref 0.0–1.0)
pH: 6 (ref 5.0–8.0)

## 2024-07-10 LAB — MICROALBUMIN / CREATININE URINE RATIO
Creatinine,U: 133 mg/dL
Microalb Creat Ratio: 550.3 mg/g — ABNORMAL HIGH (ref 0.0–30.0)
Microalb, Ur: 73.2 mg/dL — ABNORMAL HIGH (ref 0.0–1.9)

## 2024-07-10 LAB — URINE CULTURE
MICRO NUMBER:: 16782967
Result:: NO GROWTH
SPECIMEN QUALITY:: ADEQUATE

## 2024-07-11 DIAGNOSIS — N182 Chronic kidney disease, stage 2 (mild): Secondary | ICD-10-CM | POA: Insufficient documentation

## 2024-07-11 DIAGNOSIS — R809 Proteinuria, unspecified: Secondary | ICD-10-CM | POA: Insufficient documentation

## 2024-07-11 MED ORDER — DAPAGLIFLOZIN PROPANEDIOL 10 MG PO TABS: 10.0000 mg | ORAL_TABLET | Freq: Every day | ORAL | 3 refills | Status: AC

## 2024-07-25 NOTE — Progress Notes (Unsigned)
  Cardiology Office Note   Date:  07/27/2024  ID:  Jordan Arellano, DOB 11/15/1951, MRN [id number] PCP: Abbey Bruckner, MD  [facility] Providers Cardiologist:  Caron Poser, MD     History of Present Illness Jordan Arellano is a 73 y.o. male PMH prior CVA, DM2, HLD, HTN, PAD status post right BKA 2024 and multiple LLE revascularization procedures, renal artery stenosis, and reported HFpEF who presents for further evaluation and management of HFpEF.  Patient states that he is doing well from a cardiac standpoint.  He denies any chest pain, syncope, orthopnea, LE edema, or significant DOE.  Last LDL 36 06/2024.  He has been following with vascular surgery and they are managing his PAD closely.  He carries a diagnosis in the chart of HFpEF but he denies ever having any symptomatic heart failure syndrome.  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  Studies Reviewed I have independently reviewed the patient's ECG, prior CT scan, medical history, and prior echo report as well as recent blood work.  Physical Exam VS:  BP 118/74 (BP Location: Right Arm, Patient Position: Sitting, Cuff Size: Normal)   Pulse 71   Ht 6' 1 (1.854 m)   Wt 160 lb 9.6 oz (72.8 kg)   SpO2 97%   BMI 21.19 kg/m        Wt Readings from Last 3 Encounters:  07/27/24 160 lb 9.6 oz (72.8 kg)  06/28/24 161 lb (73 kg)  05/22/24 162 lb (73.5 kg)    GEN: No acute distress. NECK: No JVD; No carotid bruits. CARDIAC: RRR, no murmurs, rubs, gallops. RESPIRATORY:  Clear to auscultation. EXTREMITIES:  Warm and well-perfused. No edema.  ASSESSMENT AND PLAN Reported HFpEF Asymptomatic.  He is euvolemic on  exam.  On further review, the patient denies any symptoms or history of symptoms compatible with a heart failure syndrome.  Therefore I am not sure that he truly has HFpEF.  Regarding HFpEF therapies, he is currently on dapagliflozin .  Since he has another indication for an SGLT2 inhibitor (diabetes), I think it is okay to continue for now.  I would not add on any additional heart failure therapies or do anything else from that standpoint unless he becomes symptomatic.  Plan: - Continue dapagliflozin  10 mg daily - If he ever develops any signs or symptoms of heart failure, then would get a repeat echo and consider addition of HFpEF therapies at that time  Severe coronary artery calcifications PAD with right BKA and left sided revascularizations HLD Last LDL 36.  Follows with vascular surgery.  Stable symptoms.  He is medically optimized.  Plan: - Continue ASA 81 mg daily and Plavix  75 mg daily - Continue Crestor  40 mg daily - Continue trental  400mg  TID   5.   Bifascicular block Present for some time now. Asymptomatic. If he starts developing any syncope or other symptoms of bradycardia, will need to get a monitor and consider PPM.        Dispo: RTC 1 year  Signed, Caron Poser, MD

## 2024-07-27 ENCOUNTER — Telehealth: Payer: Self-pay

## 2024-07-27 ENCOUNTER — Ambulatory Visit

## 2024-07-27 VITALS — BP 118/74 | HR 71 | Ht 73.0 in | Wt 160.6 lb

## 2024-07-27 DIAGNOSIS — I739 Peripheral vascular disease, unspecified: Secondary | ICD-10-CM

## 2024-07-27 DIAGNOSIS — E782 Mixed hyperlipidemia: Secondary | ICD-10-CM | POA: Diagnosis not present

## 2024-07-27 DIAGNOSIS — I2510 Atherosclerotic heart disease of native coronary artery without angina pectoris: Secondary | ICD-10-CM | POA: Diagnosis not present

## 2024-07-27 DIAGNOSIS — I5032 Chronic diastolic (congestive) heart failure: Secondary | ICD-10-CM

## 2024-07-27 DIAGNOSIS — I452 Bifascicular block: Secondary | ICD-10-CM

## 2024-07-27 NOTE — Telephone Encounter (Signed)
 Patient's sister called to say he was asked if he has a stent in his leg and he said no. She was calling to say he does and it's in his left leg Dr. Jama.

## 2024-07-27 NOTE — Telephone Encounter (Signed)
 Called and spoke with sister per DPR. Sister states that Dr. Argentina asked the patient if he had a stent in his leg and the patient stated that he did not have any stents. Sister reports that patient does have a stent in his left leg and wanted to make MD aware.

## 2024-07-27 NOTE — Patient Instructions (Signed)
 Medication Instructions:  Your physician recommends that you continue on your current medications as directed. Please refer to the Current Medication list given to you today.    *If you need a refill on your cardiac medications before your next appointment, please call your pharmacy*  Lab Work: No labs ordered today    Testing/Procedures: No test ordered today   Follow-Up: At [facility], you and your health needs are our priority.  As part of our continuing mission to provide you with exceptional heart care, our providers are all part of one team.  This team includes your primary Cardiologist (physician) and Advanced Practice Providers or APPs (Physician Assistants and Nurse Practitioners) who all work together to provide you with the care you need, when you need it.  Your next appointment:   1 year(s)  Provider:   You may see Caron Poser, MD or one of the following Advanced Practice Providers on your designated Care Team:   Lonni Meager, NP Lesley Maffucci, PA-C Bernardino Bring, PA-C Cadence Delhi, PA-C Tylene Lunch, NP Barnie Hila, NP

## 2024-08-10 ENCOUNTER — Ambulatory Visit (INDEPENDENT_AMBULATORY_CARE_PROVIDER_SITE_OTHER): Admitting: Nurse Practitioner

## 2024-08-10 ENCOUNTER — Encounter (INDEPENDENT_AMBULATORY_CARE_PROVIDER_SITE_OTHER)

## 2024-08-15 ENCOUNTER — Ambulatory Visit: Admitting: Urology

## 2024-08-21 ENCOUNTER — Telehealth: Payer: Self-pay

## 2024-08-21 ENCOUNTER — Encounter

## 2024-08-21 ENCOUNTER — Ambulatory Visit

## 2024-08-21 NOTE — Telephone Encounter (Signed)
 Copied from CRM [id number]. Topic: General - Other >> Aug 21, 2024 11:28 AM [place] A wrote: Reason for CRM: Patient called to update his address and phone number  I spoke with patient to verify that we have his correct information in our system.  We have his correct phone number, but the address was incorrect.  I entered his new address in our system.

## 2024-08-21 NOTE — Telephone Encounter (Signed)
 Noted

## 2024-08-29 ENCOUNTER — Ambulatory Visit: Admitting: Urology

## 2024-09-03 ENCOUNTER — Other Ambulatory Visit (INDEPENDENT_AMBULATORY_CARE_PROVIDER_SITE_OTHER): Payer: Self-pay | Admitting: Nurse Practitioner

## 2024-09-03 DIAGNOSIS — I739 Peripheral vascular disease, unspecified: Secondary | ICD-10-CM

## 2024-09-05 ENCOUNTER — Ambulatory Visit (INDEPENDENT_AMBULATORY_CARE_PROVIDER_SITE_OTHER): Admitting: Nurse Practitioner

## 2024-09-05 ENCOUNTER — Encounter (INDEPENDENT_AMBULATORY_CARE_PROVIDER_SITE_OTHER): Payer: Self-pay | Admitting: Nurse Practitioner

## 2024-09-05 ENCOUNTER — Ambulatory Visit (INDEPENDENT_AMBULATORY_CARE_PROVIDER_SITE_OTHER)

## 2024-09-05 VITALS — BP 116/74 | HR 71 | Resp 17 | Ht 73.0 in | Wt 156.4 lb

## 2024-09-05 DIAGNOSIS — E118 Type 2 diabetes mellitus with unspecified complications: Secondary | ICD-10-CM | POA: Diagnosis not present

## 2024-09-05 DIAGNOSIS — I10 Essential (primary) hypertension: Secondary | ICD-10-CM | POA: Diagnosis not present

## 2024-09-05 DIAGNOSIS — I70222 Atherosclerosis of native arteries of extremities with rest pain, left leg: Secondary | ICD-10-CM | POA: Diagnosis not present

## 2024-09-05 DIAGNOSIS — Z9889 Other specified postprocedural states: Secondary | ICD-10-CM

## 2024-09-05 DIAGNOSIS — I739 Peripheral vascular disease, unspecified: Secondary | ICD-10-CM

## 2024-09-05 NOTE — H&P (View-Only) (Signed)
 Subjective:    Patient ID: Jordan Arellano, male    DOB: 10-17-1951, 73 y.o.   MRN: [id number] Chief Complaint  Patient presents with   Follow-up    3 mo ABI and left arterial    The patient returns today for follow-up evaluation of his peripheral arterial disease.  He denies any new open wounds or ulcerations.  He denies any significant rest pain but he does note that he has some significant numbness in his left lower extremity.  He has a previous history of a right below-knee amputation.  Today ABIs were unable to be done due to significantly decreased flow.  Arterial duplex shows he essentially has occlusion from his common femoral artery down to his distal tibial vessels.  He does have flow within his profunda which appears to be perfusing collaterals down at the distal posterior tibial artery.    Review of Systems  Musculoskeletal:  Positive for gait problem.  All other systems reviewed and are negative.      Objective:   Physical Exam Vitals reviewed.  HENT:     Head: Normocephalic.  Cardiovascular:     Rate and Rhythm: Normal rate.  Pulmonary:     Effort: Pulmonary effort is normal.  Musculoskeletal:     Right Lower Extremity: Right leg is amputated below knee.  Skin:    General: Skin is warm and dry.  Neurological:     Mental Status: He is alert and oriented to person, place, and time.  Psychiatric:        Mood and Affect: Mood normal.        Behavior: Behavior normal.        Thought Content: Thought content normal.        Judgment: Judgment normal.     BP 116/74   Pulse 71   Resp 17   Ht 6' 1 (1.854 m)   Wt 156 lb 6.4 oz (70.9 kg)   BMI 20.63 kg/m   Past Medical History:  Diagnosis Date   Acute hematogenous osteomyelitis of left foot (HCC) 05/19/2023   Basal cell carcinoma 08/11/2022   R forearm - ED&C   Cessation of tobacco use in previous 12 months 09/01/2023   Depression    Foot ulcer (HCC) 12/12/2016   Hyperlipidemia    Hypertension    PAD  (peripheral artery disease) ~2007   s/p R BKA for non-healing wound   Protein-calorie malnutrition, severe 05/24/2023   Stroke (HCC) 03/2014   MRI: Acute nonhemorrhagic left paracentral pontine infarct. 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   TIA (transient ischemic attack) 01/2014    Social History   Socioeconomic History   Marital status: Widowed    Spouse name: Not on file   Number of children: 2   Years of education: Not on file   Highest education level: Not on file  Occupational History    Comment: Disabled  Tobacco Use   Smoking status: Some Days    Current packs/day: 15.00    Average packs/day: 15.0 packs/day for 60.0 years (900.0 ttl pk-yrs)    Types: Cigarettes   Smokeless tobacco: Never  Vaping Use   Vaping status: Never Used  Substance and Sexual Activity   Alcohol use: No   Drug use: No   Sexual activity: Not Currently    Birth control/protection: Abstinence  Other Topics Concern   Not on file  Social History Narrative   One boy youngest,  killed in MVA    Social Drivers of Health   Financial Resource Strain: Low Risk  (08/18/2023)   Overall Financial Resource Strain (CARDIA)    Difficulty of Paying Living Expenses: Not hard at all  Food Insecurity: No Food Insecurity (08/18/2023)   Hunger Vital Sign    Worried About Running Out of Food in the Last Year: Never true    Ran Out of Food in the Last Year: Never true  Transportation Needs: No Transportation Needs (08/18/2023)   PRAPARE - Administrator, Civil Service (Medical): No    Lack of Transportation (Non-Medical): No  Physical Activity: Insufficiently Active (08/18/2023)   Exercise Vital Sign    Days of Exercise per Week: 3 days    Minutes of Exercise per Session: 30 min  Stress: No Stress Concern Present (08/18/2023)   [employer] of Occupational Health - Occupational Stress Questionnaire    Feeling of Stress  : Not at all  Social Connections: Socially Isolated (08/18/2023)   Social Connection and Isolation Panel    Frequency of Communication with Friends and Family: More than three times a week    Frequency of Social Gatherings with Friends and Family: More than three times a week    Attends Religious Services: Never    Database administrator or Organizations: No    Attends Banker Meetings: Never    Marital Status: Widowed  Intimate Partner Violence: Not At Risk (08/18/2023)   Humiliation, Afraid, Rape, and Kick questionnaire    Fear of Current or Ex-Partner: No    Emotionally Abused: No    Physically Abused: No    Sexually Abused: No    Past Surgical History:  Procedure Laterality Date   AMPUTATION TOE Left 05/21/2023   Procedure: AMPUTATION LEFT 1st & 3rd TOES;  Surgeon: Neill Boas, DPM;  Location: [facility] ORS;  Service: Podiatry;  Laterality: Left;   BELOW KNEE LEG AMPUTATION Right    FEMORAL-POPLITEAL BYPASS GRAFT Left 12/17/2016   Procedure: BYPASS LEFT FEMORAL TO BELOW POPLITEAL ARTERY USING PROPATEN GORE GRAFT;  Surgeon: Boas JULIANNA Doing, MD;  Location: [facility] OR;  Service: Vascular;  Laterality: Left;   FEMORAL-POPLITEAL BYPASS GRAFT Left 09/30/2017   Procedure: LEFT LEG ANGIOGRAM,  THROMBECTOMY, FEM-POPLITEAL BYPASS GRAFT, tHROMBECTOMY PERONEAL ARTERY AND POSTERIOR TIBIAL , ENDARTERECTOMY TIBIAL/PERONEAL TRUNK WITH BOVINE PATCH ANGIOPLASTY.;  Surgeon: Laurence Redell CROME, MD;  Location: [facility] OR;  Service: Vascular;  Laterality: Left;   INTRAMEDULLARY (IM) NAIL INTERTROCHANTERIC Right 03/21/2023   Procedure: INTRAMEDULLARY (IM) NAIL INTERTROCHANTERIC;  Surgeon: Cleotilde Barrio, MD;  Location: [facility] ORS;  Service: Orthopedics;  Laterality: Right;   LOWER EXTREMITY ANGIOGRAPHY Left 12/14/2022   Procedure: Lower Extremity Angiography;  Surgeon: Jama Cordella MATSU, MD;  Location: [facility] INVASIVE CV LAB;  Service: Cardiovascular;  Laterality: Left;   LOWER EXTREMITY ANGIOGRAPHY Left 05/20/2023    Procedure: Lower Extremity Angiography;  Surgeon: Marea Selinda RAMAN, MD;  Location: [facility] INVASIVE CV LAB;  Service: Cardiovascular;  Laterality: Left;   LOWER EXTREMITY ANGIOGRAPHY Left 05/26/2023   Procedure: Lower Extremity Angiography;  Surgeon: Marea Selinda RAMAN, MD;  Location: [facility] INV[facility] CV LAB;  Service: Cardiovascular;  Laterality: Left;   LOWER EXTREMITY INTERVENTION  05/20/2023   Procedure: LOWER EXTREMITY INTERVENTION;  Surgeon: Marea Selinda RAMAN, MD;  Location: [facility] INVASIVE CV LAB;  Service: Cardiovascular;;   PERIPHERAL VASCULAR CATHETERIZATION Left 12/14/2016   Procedure: Abdominal Aortogram w/Lower Extremity;  Surgeon: Lonni RAMAN Blade, MD;  Location: [facility] INVASIVE CV LAB;  Service:  Cardiovascular;  Laterality: Left;   right cataract extraction     TRANSTHORACIC ECHOCARDIOGRAM  01/2014   To evaluate possible CVA: EF 55-60%. GR 1 DD. No significant valvular lesions    Family History  Problem Relation Age of Onset   Hypertension Mother        Does not know history   Heart disease Mother    Stroke Mother    Diabetes Mother    Hypertension Father    Heart disease Father    Stroke Father    Diabetes Sister    Hypertension Sister    Heart disease Brother    Hypertension Brother     No Known Allergies     Latest Ref Rng & Units 06/28/2024    1:53 PM 06/01/2023    3:56 AM 05/30/2023    8:00 AM  CBC  WBC 4.0 - 10.5 K/uL 9.2  12.1  11.7   Hemoglobin 13.0 - 17.0 g/dL 87.3  8.7  8.1   Hematocrit 39.0 - 52.0 % 38.2  27.4  25.0   Platelets 150.0 - 400.0 K/uL 301.0  478  360       CMP     Component Value Date/Time   NA 140 06/28/2024 1353   NA 139 02/03/2016 0000   K 4.5 06/28/2024 1353   CL 104 06/28/2024 1353   CO2 26 06/28/2024 1353   GLUCOSE 114 (H) 06/28/2024 1353   BUN 21 06/28/2024 1353   BUN 28 (A) 02/03/2016 0000   CREATININE 1.20 06/28/2024 1353   CALCIUM  9.6 06/28/2024 1353   PROT 7.3 06/28/2024 1353   ALBUMIN  4.5 06/28/2024 1353   AST 15 06/28/2024 1353   ALT  12 06/28/2024 1353   ALKPHOS 57 06/28/2024 1353   BILITOT 0.4 06/28/2024 1353   GFR 60.65 06/28/2024 1353   GFRNONAA 56 (L) 06/01/2023 0356     No results found.     Assessment & Plan:   1. Critical limb ischemia of left lower extremity (HCC) (Primary) Recommend:  The patient has evidence of severe atherosclerotic changes of both lower extremities with rest pain that is associated with preulcerative changes and impending tissue loss of the left foot.  This represents a limb threatening ischemia and places the patient at the risk for left limb loss.  Patient should undergo angiography of the left lower extremity with the hope for intervention for limb salvage.  The risks and benefits as well as the alternative therapies was discussed in detail with the patient.  All questions were answered.  Patient agrees to proceed with left lower extremity angiography.  The patient will follow up with me in the office after the procedure.      2. Essential hypertension Continue antihypertensive medications as already ordered, these medications have been reviewed and there are no changes at this time.  3. Type II diabetes mellitus with complication (HCC) Continue hypoglycemic medications as already ordered, these medications have been reviewed and there are no changes at this time.  Hgb A1C to be monitored as already arranged by primary service   Current Outpatient Medications on File Prior to Visit  Medication Sig Dispense Refill   Accu-Chek FastClix Lancets MISC Use to check blood sugar 2 times a day     amLODipine  (NORVASC ) 5 MG tablet Take 1 tablet (5 mg total) by mouth daily. 90 tablet 3   ascorbic acid  (VITAMIN C ) 500 MG tablet Take 1 tablet (500 mg total) by mouth daily. 30 tablet 2  aspirin  81 MG tablet Take 81 mg by mouth daily.     Blood Glucose Monitoring Suppl (ACCU-CHEK GUIDE) w/Device KIT Use to check blood sugar 2 times a day     Cholecalciferol (VITAMIN D ) 50 MCG (2000 UT)  CAPS Take 1 capsule (2,000 Units total) by mouth daily. 90 capsule 3   clopidogrel  (PLAVIX ) 75 MG tablet Take 1 tablet (75 mg total) by mouth daily. 30 tablet 5   dapagliflozin  propanediol (FARXIGA ) 10 MG TABS tablet Take 1 tablet (10 mg total) by mouth daily. 90 tablet 3   glucose blood (ACCU-CHEK GUIDE) test strip Use to check blood sugar 2 times a day     icosapent  Ethyl (VASCEPA ) 1 g capsule Take 2 capsules (2 g total) by mouth 2 (two) times daily. 360 capsule 3   Iron , Ferrous Sulfate , 325 (65 Fe) MG TABS Take 325 mg by mouth every other day. 90 tablet 0   latanoprost  (XALATAN ) 0.005 % ophthalmic solution Place 1 drop into both eyes at bedtime.     losartan  (COZAAR ) 50 MG tablet Take 1 tablet (50 mg total) by mouth daily. 90 tablet 1   metFORMIN  (GLUCOPHAGE ) 850 MG tablet Take 1 tablet (850 mg total) by mouth 2 (two) times daily with a meal. 180 tablet 3   Multiple Vitamin (MULTIVITAMIN WITH MINERALS) TABS tablet Take 1 tablet by mouth daily. 30 tablet 3   Nutritional Supplements (ENSURE PLUS HIGH PROTEIN) LIQD Take 237 mLs by mouth in the morning, at noon, and at bedtime. 237 mL 12   pentoxifylline  (TRENTAL ) 400 MG CR tablet TAKE 1 TABLET BY MOUTH THREE TIMES DAILY WITH MEALS 90 tablet 1   rosuvastatin  (CRESTOR ) 40 MG tablet Take 1 tablet (40 mg total) by mouth daily. 90 tablet 3   senna-docusate (SENOKOT-S) 8.6-50 MG tablet Take 1 tablet by mouth at bedtime as needed for mild constipation. 30 tablet 0   sertraline  (ZOLOFT ) 100 MG tablet Take 1 tablet (100 mg total) by mouth daily. 90 tablet 3   Vitamin D , Ergocalciferol , (DRISDOL ) 1.25 MG (50000 UNIT) CAPS capsule Take 1 capsule (50,000 Units total) by mouth every 7 (seven) days. 12 capsule 0   mupirocin  ointment (BACTROBAN ) 2 % Apply 1 Application topically 3 (three) times daily. (Patient not taking: Reported on 09/05/2024)     SANTYL 250 UNIT/GM ointment Apply 1 Application topically daily. (Patient not taking: Reported on 09/05/2024)      No current facility-administered medications on file prior to visit.    There are no Patient Instructions on file for this visit. No follow-ups on file.   Kavon Valenza E Von Quintanar, NP

## 2024-09-05 NOTE — Progress Notes (Signed)
 Subjective:    Patient ID: Jordan Arellano, male    DOB: 10-17-1951, 73 y.o.   MRN: [id number] Chief Complaint  Patient presents with   Follow-up    3 mo ABI and left arterial    The patient returns today for follow-up evaluation of his peripheral arterial disease.  He denies any new open wounds or ulcerations.  He denies any significant rest pain but he does note that he has some significant numbness in his left lower extremity.  He has a previous history of a right below-knee amputation.  Today ABIs were unable to be done due to significantly decreased flow.  Arterial duplex shows he essentially has occlusion from his common femoral artery down to his distal tibial vessels.  He does have flow within his profunda which appears to be perfusing collaterals down at the distal posterior tibial artery.    Review of Systems  Musculoskeletal:  Positive for gait problem.  All other systems reviewed and are negative.      Objective:   Physical Exam Vitals reviewed.  HENT:     Head: Normocephalic.  Cardiovascular:     Rate and Rhythm: Normal rate.  Pulmonary:     Effort: Pulmonary effort is normal.  Musculoskeletal:     Right Lower Extremity: Right leg is amputated below knee.  Skin:    General: Skin is warm and dry.  Neurological:     Mental Status: He is alert and oriented to person, place, and time.  Psychiatric:        Mood and Affect: Mood normal.        Behavior: Behavior normal.        Thought Content: Thought content normal.        Judgment: Judgment normal.     BP 116/74   Pulse 71   Resp 17   Ht 6' 1 (1.854 m)   Wt 156 lb 6.4 oz (70.9 kg)   BMI 20.63 kg/m   Past Medical History:  Diagnosis Date   Acute hematogenous osteomyelitis of left foot (HCC) 05/19/2023   Basal cell carcinoma 08/11/2022   R forearm - ED&C   Cessation of tobacco use in previous 12 months 09/01/2023   Depression    Foot ulcer (HCC) 12/12/2016   Hyperlipidemia    Hypertension    PAD  (peripheral artery disease) ~2007   s/p R BKA for non-healing wound   Protein-calorie malnutrition, severe 05/24/2023   Stroke (HCC) 03/2014   MRI: Acute nonhemorrhagic left paracentral pontine infarct. 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   TIA (transient ischemic attack) 01/2014    Social History   Socioeconomic History   Marital status: Widowed    Spouse name: Not on file   Number of children: 2   Years of education: Not on file   Highest education level: Not on file  Occupational History    Comment: Disabled  Tobacco Use   Smoking status: Some Days    Current packs/day: 15.00    Average packs/day: 15.0 packs/day for 60.0 years (900.0 ttl pk-yrs)    Types: Cigarettes   Smokeless tobacco: Never  Vaping Use   Vaping status: Never Used  Substance and Sexual Activity   Alcohol use: No   Drug use: No   Sexual activity: Not Currently    Birth control/protection: Abstinence  Other Topics Concern   Not on file  Social History Narrative   One boy youngest,  killed in MVA    Social Drivers of Health   Financial Resource Strain: Low Risk  (08/18/2023)   Overall Financial Resource Strain (CARDIA)    Difficulty of Paying Living Expenses: Not hard at all  Food Insecurity: No Food Insecurity (08/18/2023)   Hunger Vital Sign    Worried About Running Out of Food in the Last Year: Never true    Ran Out of Food in the Last Year: Never true  Transportation Needs: No Transportation Needs (08/18/2023)   PRAPARE - Administrator, Civil Service (Medical): No    Lack of Transportation (Non-Medical): No  Physical Activity: Insufficiently Active (08/18/2023)   Exercise Vital Sign    Days of Exercise per Week: 3 days    Minutes of Exercise per Session: 30 min  Stress: No Stress Concern Present (08/18/2023)   [employer] of Occupational Health - Occupational Stress Questionnaire    Feeling of Stress  : Not at all  Social Connections: Socially Isolated (08/18/2023)   Social Connection and Isolation Panel    Frequency of Communication with Friends and Family: More than three times a week    Frequency of Social Gatherings with Friends and Family: More than three times a week    Attends Religious Services: Never    Database administrator or Organizations: No    Attends Banker Meetings: Never    Marital Status: Widowed  Intimate Partner Violence: Not At Risk (08/18/2023)   Humiliation, Afraid, Rape, and Kick questionnaire    Fear of Current or Ex-Partner: No    Emotionally Abused: No    Physically Abused: No    Sexually Abused: No    Past Surgical History:  Procedure Laterality Date   AMPUTATION TOE Left 05/21/2023   Procedure: AMPUTATION LEFT 1st & 3rd TOES;  Surgeon: Neill Boas, DPM;  Location: [facility] ORS;  Service: Podiatry;  Laterality: Left;   BELOW KNEE LEG AMPUTATION Right    FEMORAL-POPLITEAL BYPASS GRAFT Left 12/17/2016   Procedure: BYPASS LEFT FEMORAL TO BELOW POPLITEAL ARTERY USING PROPATEN GORE GRAFT;  Surgeon: Boas JULIANNA Doing, MD;  Location: [facility] OR;  Service: Vascular;  Laterality: Left;   FEMORAL-POPLITEAL BYPASS GRAFT Left 09/30/2017   Procedure: LEFT LEG ANGIOGRAM,  THROMBECTOMY, FEM-POPLITEAL BYPASS GRAFT, tHROMBECTOMY PERONEAL ARTERY AND POSTERIOR TIBIAL , ENDARTERECTOMY TIBIAL/PERONEAL TRUNK WITH BOVINE PATCH ANGIOPLASTY.;  Surgeon: Laurence Redell CROME, MD;  Location: [facility] OR;  Service: Vascular;  Laterality: Left;   INTRAMEDULLARY (IM) NAIL INTERTROCHANTERIC Right 03/21/2023   Procedure: INTRAMEDULLARY (IM) NAIL INTERTROCHANTERIC;  Surgeon: Cleotilde Barrio, MD;  Location: [facility] ORS;  Service: Orthopedics;  Laterality: Right;   LOWER EXTREMITY ANGIOGRAPHY Left 12/14/2022   Procedure: Lower Extremity Angiography;  Surgeon: Jama Cordella MATSU, MD;  Location: [facility] INVASIVE CV LAB;  Service: Cardiovascular;  Laterality: Left;   LOWER EXTREMITY ANGIOGRAPHY Left 05/20/2023    Procedure: Lower Extremity Angiography;  Surgeon: Marea Selinda RAMAN, MD;  Location: [facility] INVASIVE CV LAB;  Service: Cardiovascular;  Laterality: Left;   LOWER EXTREMITY ANGIOGRAPHY Left 05/26/2023   Procedure: Lower Extremity Angiography;  Surgeon: Marea Selinda RAMAN, MD;  Location: [facility] INV[facility] CV LAB;  Service: Cardiovascular;  Laterality: Left;   LOWER EXTREMITY INTERVENTION  05/20/2023   Procedure: LOWER EXTREMITY INTERVENTION;  Surgeon: Marea Selinda RAMAN, MD;  Location: [facility] INVASIVE CV LAB;  Service: Cardiovascular;;   PERIPHERAL VASCULAR CATHETERIZATION Left 12/14/2016   Procedure: Abdominal Aortogram w/Lower Extremity;  Surgeon: Lonni RAMAN Blade, MD;  Location: [facility] INVASIVE CV LAB;  Service:  Cardiovascular;  Laterality: Left;   right cataract extraction     TRANSTHORACIC ECHOCARDIOGRAM  01/2014   To evaluate possible CVA: EF 55-60%. GR 1 DD. No significant valvular lesions    Family History  Problem Relation Age of Onset   Hypertension Mother        Does not know history   Heart disease Mother    Stroke Mother    Diabetes Mother    Hypertension Father    Heart disease Father    Stroke Father    Diabetes Sister    Hypertension Sister    Heart disease Brother    Hypertension Brother     No Known Allergies     Latest Ref Rng & Units 06/28/2024    1:53 PM 06/01/2023    3:56 AM 05/30/2023    8:00 AM  CBC  WBC 4.0 - 10.5 K/uL 9.2  12.1  11.7   Hemoglobin 13.0 - 17.0 g/dL 87.3  8.7  8.1   Hematocrit 39.0 - 52.0 % 38.2  27.4  25.0   Platelets 150.0 - 400.0 K/uL 301.0  478  360       CMP     Component Value Date/Time   NA 140 06/28/2024 1353   NA 139 02/03/2016 0000   K 4.5 06/28/2024 1353   CL 104 06/28/2024 1353   CO2 26 06/28/2024 1353   GLUCOSE 114 (H) 06/28/2024 1353   BUN 21 06/28/2024 1353   BUN 28 (A) 02/03/2016 0000   CREATININE 1.20 06/28/2024 1353   CALCIUM  9.6 06/28/2024 1353   PROT 7.3 06/28/2024 1353   ALBUMIN  4.5 06/28/2024 1353   AST 15 06/28/2024 1353   ALT  12 06/28/2024 1353   ALKPHOS 57 06/28/2024 1353   BILITOT 0.4 06/28/2024 1353   GFR 60.65 06/28/2024 1353   GFRNONAA 56 (L) 06/01/2023 0356     No results found.     Assessment & Plan:   1. Critical limb ischemia of left lower extremity (HCC) (Primary) Recommend:  The patient has evidence of severe atherosclerotic changes of both lower extremities with rest pain that is associated with preulcerative changes and impending tissue loss of the left foot.  This represents a limb threatening ischemia and places the patient at the risk for left limb loss.  Patient should undergo angiography of the left lower extremity with the hope for intervention for limb salvage.  The risks and benefits as well as the alternative therapies was discussed in detail with the patient.  All questions were answered.  Patient agrees to proceed with left lower extremity angiography.  The patient will follow up with me in the office after the procedure.      2. Essential hypertension Continue antihypertensive medications as already ordered, these medications have been reviewed and there are no changes at this time.  3. Type II diabetes mellitus with complication (HCC) Continue hypoglycemic medications as already ordered, these medications have been reviewed and there are no changes at this time.  Hgb A1C to be monitored as already arranged by primary service   Current Outpatient Medications on File Prior to Visit  Medication Sig Dispense Refill   Accu-Chek FastClix Lancets MISC Use to check blood sugar 2 times a day     amLODipine  (NORVASC ) 5 MG tablet Take 1 tablet (5 mg total) by mouth daily. 90 tablet 3   ascorbic acid  (VITAMIN C ) 500 MG tablet Take 1 tablet (500 mg total) by mouth daily. 30 tablet 2  aspirin  81 MG tablet Take 81 mg by mouth daily.     Blood Glucose Monitoring Suppl (ACCU-CHEK GUIDE) w/Device KIT Use to check blood sugar 2 times a day     Cholecalciferol (VITAMIN D ) 50 MCG (2000 UT)  CAPS Take 1 capsule (2,000 Units total) by mouth daily. 90 capsule 3   clopidogrel  (PLAVIX ) 75 MG tablet Take 1 tablet (75 mg total) by mouth daily. 30 tablet 5   dapagliflozin  propanediol (FARXIGA ) 10 MG TABS tablet Take 1 tablet (10 mg total) by mouth daily. 90 tablet 3   glucose blood (ACCU-CHEK GUIDE) test strip Use to check blood sugar 2 times a day     icosapent  Ethyl (VASCEPA ) 1 g capsule Take 2 capsules (2 g total) by mouth 2 (two) times daily. 360 capsule 3   Iron , Ferrous Sulfate , 325 (65 Fe) MG TABS Take 325 mg by mouth every other day. 90 tablet 0   latanoprost  (XALATAN ) 0.005 % ophthalmic solution Place 1 drop into both eyes at bedtime.     losartan  (COZAAR ) 50 MG tablet Take 1 tablet (50 mg total) by mouth daily. 90 tablet 1   metFORMIN  (GLUCOPHAGE ) 850 MG tablet Take 1 tablet (850 mg total) by mouth 2 (two) times daily with a meal. 180 tablet 3   Multiple Vitamin (MULTIVITAMIN WITH MINERALS) TABS tablet Take 1 tablet by mouth daily. 30 tablet 3   Nutritional Supplements (ENSURE PLUS HIGH PROTEIN) LIQD Take 237 mLs by mouth in the morning, at noon, and at bedtime. 237 mL 12   pentoxifylline  (TRENTAL ) 400 MG CR tablet TAKE 1 TABLET BY MOUTH THREE TIMES DAILY WITH MEALS 90 tablet 1   rosuvastatin  (CRESTOR ) 40 MG tablet Take 1 tablet (40 mg total) by mouth daily. 90 tablet 3   senna-docusate (SENOKOT-S) 8.6-50 MG tablet Take 1 tablet by mouth at bedtime as needed for mild constipation. 30 tablet 0   sertraline  (ZOLOFT ) 100 MG tablet Take 1 tablet (100 mg total) by mouth daily. 90 tablet 3   Vitamin D , Ergocalciferol , (DRISDOL ) 1.25 MG (50000 UNIT) CAPS capsule Take 1 capsule (50,000 Units total) by mouth every 7 (seven) days. 12 capsule 0   mupirocin  ointment (BACTROBAN ) 2 % Apply 1 Application topically 3 (three) times daily. (Patient not taking: Reported on 09/05/2024)     SANTYL 250 UNIT/GM ointment Apply 1 Application topically daily. (Patient not taking: Reported on 09/05/2024)      No current facility-administered medications on file prior to visit.    There are no Patient Instructions on file for this visit. No follow-ups on file.   Kavon Valenza E Von Quintanar, NP

## 2024-09-20 ENCOUNTER — Other Ambulatory Visit: Payer: Self-pay

## 2024-09-20 DIAGNOSIS — I10 Essential (primary) hypertension: Secondary | ICD-10-CM

## 2024-09-25 ENCOUNTER — Encounter: Payer: Self-pay | Admitting: Vascular Surgery

## 2024-09-25 ENCOUNTER — Other Ambulatory Visit: Payer: Self-pay

## 2024-09-25 ENCOUNTER — Emergency Department
Admission: EM | Admit: 2024-09-25 | Discharge: 2024-09-26 | Disposition: A | Discharge status: Home / Self Care | Attending: Emergency Medicine | Admitting: Emergency Medicine

## 2024-09-25 ENCOUNTER — Encounter
Admission: RE | Disposition: A | Discharge status: Home / Self Care | Payer: Self-pay | Source: Home / Self Care | Attending: Vascular Surgery

## 2024-09-25 ENCOUNTER — Ambulatory Visit (HOSPITAL_BASED_OUTPATIENT_CLINIC_OR_DEPARTMENT_OTHER)
Admission: RE | Admit: 2024-09-25 | Discharge: 2024-09-25 | Disposition: A | Discharge status: Home / Self Care | Source: Home / Self Care | Attending: Vascular Surgery | Admitting: Vascular Surgery

## 2024-09-25 DIAGNOSIS — I70222 Atherosclerosis of native arteries of extremities with rest pain, left leg: Secondary | ICD-10-CM | POA: Diagnosis present

## 2024-09-25 DIAGNOSIS — Z89511 Acquired absence of right leg below knee: Secondary | ICD-10-CM | POA: Diagnosis not present

## 2024-09-25 DIAGNOSIS — E1151 Type 2 diabetes mellitus with diabetic peripheral angiopathy without gangrene: Secondary | ICD-10-CM | POA: Diagnosis not present

## 2024-09-25 DIAGNOSIS — Z9889 Other specified postprocedural states: Secondary | ICD-10-CM | POA: Diagnosis not present

## 2024-09-25 DIAGNOSIS — I70201 Unspecified atherosclerosis of native arteries of extremities, right leg: Secondary | ICD-10-CM

## 2024-09-25 DIAGNOSIS — I700 Atherosclerosis of aorta: Secondary | ICD-10-CM | POA: Diagnosis not present

## 2024-09-25 DIAGNOSIS — Z7984 Long term (current) use of oral hypoglycemic drugs: Secondary | ICD-10-CM | POA: Diagnosis not present

## 2024-09-25 DIAGNOSIS — Z604 Social exclusion and rejection: Secondary | ICD-10-CM | POA: Diagnosis not present

## 2024-09-25 DIAGNOSIS — I10 Essential (primary) hypertension: Secondary | ICD-10-CM | POA: Diagnosis not present

## 2024-09-25 DIAGNOSIS — M79604 Pain in right leg: Secondary | ICD-10-CM | POA: Insufficient documentation

## 2024-09-25 DIAGNOSIS — F17210 Nicotine dependence, cigarettes, uncomplicated: Secondary | ICD-10-CM | POA: Diagnosis not present

## 2024-09-25 DIAGNOSIS — T82856A Stenosis of peripheral vascular stent, initial encounter: Secondary | ICD-10-CM | POA: Diagnosis not present

## 2024-09-25 DIAGNOSIS — Z89422 Acquired absence of other left toe(s): Secondary | ICD-10-CM | POA: Diagnosis not present

## 2024-09-25 HISTORY — PX: LOWER EXTREMITY INTERVENTION: CATH118252

## 2024-09-25 HISTORY — PX: LOWER EXTREMITY ANGIOGRAPHY: CATH118251

## 2024-09-25 LAB — CBC WITH DIFFERENTIAL/PLATELET
Abs Immature Granulocytes: 0.13 K/uL — ABNORMAL HIGH (ref 0.00–0.07)
Basophils Absolute: 0.1 K/uL (ref 0.0–0.1)
Basophils Relative: 0 %
Eosinophils Absolute: 0.1 K/uL (ref 0.0–0.5)
Eosinophils Relative: 1 %
HCT: 39.9 % (ref 39.0–52.0)
Hemoglobin: 12.8 g/dL — ABNORMAL LOW (ref 13.0–17.0)
Immature Granulocytes: 1 %
Lymphocytes Relative: 2 %
Lymphs Abs: 0.3 K/uL — ABNORMAL LOW (ref 0.7–4.0)
MCH: 30.1 pg (ref 26.0–34.0)
MCHC: 32.1 g/dL (ref 30.0–36.0)
MCV: 93.9 fL (ref 80.0–100.0)
Monocytes Absolute: 0.6 K/uL (ref 0.1–1.0)
Monocytes Relative: 3 %
Neutro Abs: 16 K/uL — ABNORMAL HIGH (ref 1.7–7.7)
Neutrophils Relative %: 93 %
Platelets: 174 K/uL (ref 150–400)
RBC: 4.25 MIL/uL (ref 4.22–5.81)
RDW: 14.8 % (ref 11.5–15.5)
WBC: 17.1 K/uL — ABNORMAL HIGH (ref 4.0–10.5)
nRBC: 0 % (ref 0.0–0.2)

## 2024-09-25 LAB — CREATININE, SERUM
Creatinine, Ser: 1.3 mg/dL — ABNORMAL HIGH (ref 0.61–1.24)
GFR, Estimated: 58 mL/min — ABNORMAL LOW (ref >60)

## 2024-09-25 LAB — BUN: BUN: 23 mg/dL (ref 8–23)

## 2024-09-25 LAB — GLUCOSE, CAPILLARY: Glucose-Capillary: 123 mg/dL — ABNORMAL HIGH (ref 70–99)

## 2024-09-25 SURGERY — LOWER EXTREMITY INTERVENTION
Anesthesia: Moderate Sedation | Site: Leg Lower | Laterality: Left

## 2024-09-25 MED ORDER — MIDAZOLAM HCL 2 MG/2ML IJ SOLN
INTRAMUSCULAR | Status: AC
  Filled 2024-09-25: qty 4

## 2024-09-25 MED ORDER — MIDAZOLAM HCL 2 MG/ML PO SYRP: 8.0000 mg | ORAL_SOLUTION | Freq: Once | ORAL | Status: DC | PRN

## 2024-09-25 MED ORDER — MORPHINE SULFATE (PF) 2 MG/ML IV SOLN: 2.0000 mg | INTRAVENOUS | Status: DC | PRN

## 2024-09-25 MED ORDER — HYDRALAZINE HCL 20 MG/ML IJ SOLN: 5.0000 mg | INTRAMUSCULAR | Status: DC | PRN

## 2024-09-25 MED ORDER — LABETALOL HCL 5 MG/ML IV SOLN: 10.0000 mg | INTRAVENOUS | Status: DC | PRN

## 2024-09-25 MED ORDER — DIPHENHYDRAMINE HCL 50 MG/ML IJ SOLN: 50.0000 mg | Freq: Once | INTRAMUSCULAR | Status: DC | PRN

## 2024-09-25 MED ORDER — IODIXANOL 320 MG/ML IV SOLN
INTRAVENOUS | Status: DC | PRN
  Administered 2024-09-25: 85 mL

## 2024-09-25 MED ORDER — SODIUM CHLORIDE 0.9% FLUSH: 3.0000 mL | Freq: Two times a day (BID) | INTRAVENOUS | Status: DC

## 2024-09-25 MED ORDER — FAMOTIDINE 20 MG PO TABS: 40.0000 mg | ORAL_TABLET | Freq: Once | ORAL | Status: DC | PRN

## 2024-09-25 MED ORDER — HEPARIN (PORCINE) IN NACL 1000-0.9 UT/500ML-% IV SOLN
INTRAVENOUS | Status: DC | PRN
  Administered 2024-09-25: 1000 mL

## 2024-09-25 MED ORDER — SODIUM CHLORIDE 0.9 % IV SOLN: INTRAVENOUS | Status: DC

## 2024-09-25 MED ORDER — HYDROMORPHONE HCL 1 MG/ML IJ SOLN: 1.0000 mg | Freq: Once | INTRAMUSCULAR | Status: DC | PRN

## 2024-09-25 MED ORDER — ONDANSETRON HCL 4 MG/2ML IJ SOLN: 4.0000 mg | Freq: Four times a day (QID) | INTRAMUSCULAR | Status: DC | PRN

## 2024-09-25 MED ORDER — MIDAZOLAM HCL (PF) 2 MG/2ML IJ SOLN
INTRAMUSCULAR | Status: DC | PRN
  Administered 2024-09-25: .5 mg via INTRAVENOUS
  Administered 2024-09-25: 2 mg via INTRAVENOUS

## 2024-09-25 MED ORDER — OXYCODONE HCL 5 MG PO TABS: 5.0000 mg | ORAL_TABLET | ORAL | Status: DC | PRN

## 2024-09-25 MED ORDER — CEFAZOLIN SODIUM-DEXTROSE 2-4 GM/100ML-% IV SOLN
2.0000 g | INTRAVENOUS | Status: AC
  Administered 2024-09-25: 2 g via INTRAVENOUS

## 2024-09-25 MED ORDER — SODIUM CHLORIDE 0.9% FLUSH: 3.0000 mL | INTRAVENOUS | Status: DC | PRN

## 2024-09-25 MED ORDER — HEPARIN SODIUM (PORCINE) 1000 UNIT/ML IJ SOLN
INTRAMUSCULAR | Status: AC
  Filled 2024-09-25: qty 10

## 2024-09-25 MED ORDER — ACETAMINOPHEN 325 MG PO TABS: 650.0000 mg | ORAL_TABLET | ORAL | Status: DC | PRN

## 2024-09-25 MED ORDER — FENTANYL CITRATE (PF) 100 MCG/2ML IJ SOLN
INTRAMUSCULAR | Status: AC
  Filled 2024-09-25: qty 4

## 2024-09-25 MED ORDER — METHYLPREDNISOLONE SODIUM SUCC 125 MG IJ SOLR: 125.0000 mg | Freq: Once | INTRAMUSCULAR | Status: DC | PRN

## 2024-09-25 MED ORDER — LIDOCAINE-EPINEPHRINE (PF) 1 %-1:200000 IJ SOLN
INTRAMUSCULAR | Status: DC | PRN
  Administered 2024-09-25: 10 mL

## 2024-09-25 MED ORDER — CEFAZOLIN SODIUM-DEXTROSE 2-4 GM/100ML-% IV SOLN
INTRAVENOUS | Status: AC
  Filled 2024-09-25: qty 100

## 2024-09-25 MED ORDER — FENTANYL CITRATE (PF) 100 MCG/2ML IJ SOLN
INTRAMUSCULAR | Status: DC | PRN
  Administered 2024-09-25: 50 ug via INTRAVENOUS
  Administered 2024-09-25: 25 ug via INTRAVENOUS

## 2024-09-25 MED ORDER — SODIUM CHLORIDE 0.9 % IV SOLN: 250.0000 mL | INTRAVENOUS | Status: DC | PRN

## 2024-09-25 MED ORDER — SODIUM CHLORIDE 0.9 % IV SOLN: INTRAVENOUS | Status: AC

## 2024-09-25 SURGICAL SUPPLY — 12 items
CATH ANGIO 5F PIGTAIL 65CM (CATHETERS) IMPLANT
COVER PROBE ULTRASOUND 5X96 (MISCELLANEOUS) IMPLANT
GLIDEWIRE ADV .035X260CM (WIRE) IMPLANT
GOWN STRL REUS W/ TWL LRG LVL3 (GOWN DISPOSABLE) ×1 IMPLANT
NDL ENTRY 21GA 7CM ECHOTIP (NEEDLE) IMPLANT
NEEDLE ENTRY 21GA 7CM ECHOTIP (NEEDLE) ×1 IMPLANT
PACK ANGIOGRAPHY (CUSTOM PROCEDURE TRAY) ×1 IMPLANT
SET INTRO CAPELLA COAXIAL (SET/KITS/TRAYS/PACK) IMPLANT
SHEATH BRITE TIP 5FRX11 (SHEATH) IMPLANT
SYR MEDRAD MARK 7 150ML (SYRINGE) IMPLANT
TUBING CONTRAST HIGH PRESS 72 (TUBING) IMPLANT
WIRE J 3MM .035X145CM (WIRE) IMPLANT

## 2024-09-25 NOTE — Op Note (Signed)
 Jordan Arellano  Percutaneous Study/Intervention Procedural Note   Date of Surgery: 09/25/2024,10:54 AM  Surgeon:Yasmin Bronaugh, Jordan MATSU   Pre-operative Diagnosis: Atherosclerotic occlusive disease bilateral lower extremities with rest pain of the left foot  Post-operative diagnosis:  Same  Procedure(s) Performed:  1.  Abdominal aortogram with bilateral lower extremity distal runoff  2.  Introduction catheter into the aorta  3.  Ultrasound-guided access to the right common femoral artery     Anesthesia: Conscious sedation was administered by the interventional radiology RN under my direct supervision. IV Versed  plus fentanyl  were utilized. Continuous ECG, pulse oximetry and blood pressure was monitored throughout the entire procedure.  Conscious sedation was administered for a total of 31 minutes.  Sheath: 5 French 11 cm Pinnacle sheath retrograde right common femoral  Contrast: 85 cc   Fluoroscopy Time: 2.8 minutes  Indications:  The patient presents to [facility] with atherosclerotic occlusive disease bilateral lower extremities with rest pain of the left lower extremity.  Pedal pulses are nonpalpable bilaterally suggesting hemodynamically significant atherosclerotic occlusive disease.  The risks and benefits as well as alternative therapies for lower extremity revascularization are reviewed with the patient all questions are answered the patient agrees to proceed.  The patient is therefore undergoing angiography with the hope for intervention for limb salvage.   Procedure:  Jordan Wilsonis a 73 y.o. male who was identified and appropriate procedural time out was performed.  The patient was then placed supine on the table and prepped and draped in the usual sterile fashion.  Ultrasound was used to evaluate the right common femoral artery.  It was echolucent and pulsatile indicating it is patent .  An ultrasound image was acquired for the permanent record.  A  micropuncture needle was used to access the right common femoral artery under direct ultrasound guidance.  The microwire was then advanced under fluoroscopic guidance without difficulty followed by the micro-sheath.  A 0.035 J wire was advanced without resistance and a 5Fr sheath was placed.    Pigtail catheter was then advanced to the level of T12 and AP projection of the aorta was obtained. Pigtail catheter was then repositioned to above the bifurcation and bilateral oblique view of the pelvis were obtained.  Detector was returned to the AP projection and bilateral distal runoff was then performed.  After review of the images the catheter was removed over wire and the sheath pulled and pressure held.  Findings:   Aortogram: The abdominal aorta is opacified with a bolus injection contrast.  Single renal arteries are noted bilaterally with normal nephrograms.  No evidence of hemodynamically significant renal artery stenosis.  Previously placed common iliac artery stents are noted bilaterally.  There are no hemodynamically significant stenoses identified within the aorta.  The aortic bifurcation is mildly diseased but widely patent.  Bilateral common internal and external iliac arteries are free of hemodynamically significant lesions the identified stents are all widely patent.   Left lower Extremity: The left common femoral and profunda femoris arteries are patent and free of hemodynamically significant stenosis.  The superficial femoral and popliteal arteries demonstrate previous stents all the way down to the tibioperoneal trunk and these are all occluded from the origin throughout their entirety.  There are no changes to suggest a previous bypass.  There is extensive disease of the proximal tibial vessels with occlusion of the anterior tibial artery from its origin distally as well as the entire TP trunk.  It appears that the posterior tibial is reconstituted  by collaterals and is a decent target for  bypass.  However, distally the posterior tibial does not appear to fill the plantar arteries rather crosses over to fill the dorsalis pedis as the peroneal would.  The peroneal artery appears to be occluded.  There is single vessel runoff to the foot.   Right lower extremity: The right common femoral is a string sign with extensive disease extending into the profunda femoris for several centimeters.  Distal to this lesion and the origin of the profunda the profunda femoris appears to be patent and of good caliber with extensive collaterals distally.  The SFA is occluded throughout its entirety as expected with a below-knee amputation.  SUMMARY: Based on these images no intervention is performed at this time.  The patient will require a left femoral to tibial bypass it appears to be the posterior tibial but on table angiography should be performed at the time of surgery given the anomalous distal outflow.  There is documentation that he has had previous vein mapping which was not adequate for bypass in which case a distal flow graft would be the likely choice.  On the right the patient should undergo endarterectomy of the common femoral and profunda femoris given the extensive nature of the disease if not treated he would likely progress to an above-knee amputation.  Given the redo and extensive nature of both of these situations they would be done separately.    Disposition: Patient was taken to the recovery room in stable condition having tolerated the procedure well.  Jordan Arellano 09/25/2024,10:54 AM

## 2024-09-25 NOTE — Interval H&P Note (Signed)
 History and Physical Interval Note:  09/25/2024 9:47 AM  Jordan Arellano  has presented today for surgery, with the diagnosis of LLE Angio   Critital limb ischemia.  The various methods of treatment have been discussed with the patient and family. After consideration of risks, benefits and other options for treatment, the patient has consented to  Procedure(s): LOWER EXTREMITY INTERVENTION (Left) Lower Extremity Angiography (Left) as a surgical intervention.  The patient's history has been reviewed, patient examined, no change in status, stable for surgery.  I have reviewed the patient's chart and labs.  Questions were answered to the patient's satisfaction.     Cordella Shawl

## 2024-09-26 ENCOUNTER — Encounter: Payer: Self-pay | Admitting: Vascular Surgery

## 2024-09-26 ENCOUNTER — Emergency Department

## 2024-09-26 LAB — BASIC METABOLIC PANEL WITH GFR
Anion gap: 14 (ref 5–15)
BUN: 25 mg/dL — ABNORMAL HIGH (ref 8–23)
CO2: 23 mmol/L (ref 22–32)
Calcium: 8.9 mg/dL (ref 8.9–10.3)
Chloride: 101 mmol/L (ref 98–111)
Creatinine, Ser: 1.91 mg/dL — ABNORMAL HIGH (ref 0.61–1.24)
GFR, Estimated: 37 mL/min — ABNORMAL LOW (ref >60)
Glucose, Bld: 165 mg/dL — ABNORMAL HIGH (ref 70–99)
Potassium: 4.1 mmol/L (ref 3.5–5.1)
Sodium: 138 mmol/L (ref 135–145)

## 2024-09-26 LAB — CK: Total CK: 213 U/L (ref 49–397)

## 2024-09-26 LAB — LACTIC ACID, PLASMA
Lactic Acid, Venous: 2.2 mmol/L (ref 0.5–1.9)
Lactic Acid, Venous: 1.2 mmol/L (ref 0.5–1.9)

## 2024-09-26 MED ORDER — SODIUM CHLORIDE 0.9 % IV BOLUS
1000.0000 mL | Freq: Once | INTRAVENOUS | Status: AC
  Administered 2024-09-26: 1000 mL via INTRAVENOUS

## 2024-09-26 NOTE — Discharge Instructions (Signed)
 I think the pain in your leg is a combination of blood vessel problems but also likely due to the tight fitting compression sleeve on your right knee.  Discontinue use of this and find another properly fitting sleeve.  Follow-up with your vascular surgeon  Thank you for choosing us  for your health care today!  Please see your primary doctor this week for a follow up appointment.   If you have any new, worsening, or unexpected symptoms call your doctor right away or come back to the emergency department for reevaluation.  It was my pleasure to care for you today.   Ginnie EDISON Cyrena, MD

## 2024-09-26 NOTE — ED Provider Notes (Signed)
 [facility] Provider Note    Event Date/Time   First MD Initiated Contact with Patient 09/25/24 2323     (approximate)   History   Knee Pain (R knee, started about 3 hrs ago, no hx of trauma)   HPI  Jordan Arellano is a 73 y.o. male   Past medical history of peripheral vascular disease, right sided BKA, here with pain behind the right knee.  Had an angiography done with extensive disease bilaterally noted by Dr. Jama vascular surgery yesterday with no interventions.  He also has had a new compressive sleeve over the left knee/prosthesis last week.  He says it fits quite tightly.  He has had no trauma.  Pain behind the right knee started while resting this evening.  Uncomplicated procedure angiography noted by vascular surgery.  Access was in the right groin.  Independent Historian contributed to assessment above: EMS gives report  External Medical Documents Reviewed: Dr. Kathryn operative note      Physical Exam   Triage Vital Signs: ED Triage Vitals  Encounter Vitals Group     BP 09/25/24 2319 94/69     Girls Systolic BP Percentile --      Girls Diastolic BP Percentile --      Boys Systolic BP Percentile --      Boys Diastolic BP Percentile --      Pulse Rate 09/25/24 2319 82     Resp 09/25/24 2319 (!) 22     Temp 09/25/24 2319 97.8 F (36.6 C)     Temp Source 09/25/24 2319 Oral     SpO2 09/25/24 2319 100 %     Weight 09/25/24 2320 149 lb 14.6 oz (68 kg)     Height 09/25/24 2320 6' 1 (1.854 m)     Head Circumference --      Peak Flow --      Pain Score --      Pain Loc --      Pain Education --      Exclude from Growth Chart --     Most recent vital signs: Vitals:   09/25/24 2319  BP: 94/69  Pulse: 82  Resp: (!) 22  Temp: 97.8 F (36.6 C)  SpO2: 100%    General: Awake, no distress.  CV:  Good peripheral perfusion.  Resp:  Normal effort.  Abd:  No distention.  Other:  After taking down the compressive sleeve the  area underneath the sleeve appeared white and very cool to the touch.  There was some tenderness with palpation along the musculature in this area but no bony tenderness he is able to range fully.  The access site to the right groin was without any masses, infectious changes.  No tenderness to palpation of that area.  After waiting approximately 5 to 10 minutes the color began to improved to the area under the compressive sleeve, now pink, warm, and he noted that the tenderness to palpation in that same area had began to decrease.   ED Results / Procedures / Treatments   Labs (all labs ordered are listed, but only abnormal results are displayed) Labs Reviewed  BASIC METABOLIC PANEL WITH GFR - Abnormal; Notable for the following components:      Result Value   Glucose, Bld 165 (*)    BUN 25 (*)    Creatinine, Ser 1.91 (*)    GFR, Estimated 37 (*)    All other components within normal limits  CBC WITH DIFFERENTIAL/PLATELET -  Abnormal; Notable for the following components:   WBC 17.1 (*)    Hemoglobin 12.8 (*)    Neutro Abs 16.0 (*)    Lymphs Abs 0.3 (*)    Abs Immature Granulocytes 0.13 (*)    All other components within normal limits  LACTIC ACID, PLASMA - Abnormal; Notable for the following components:   Lactic Acid, Venous 2.2 (*)    All other components within normal limits  LACTIC ACID, PLASMA  CK     I ordered and reviewed the above labs they are notable for creatinine is elevated 1.91 compared to prior.  White blood cell count 17.  Lactic acidosis at 2.2.  RADIOLOGY I independently reviewed and interpreted ultrasound of leg and see no obvious DVT or fluid collections I also reviewed radiologist's formal read.   PROCEDURES:  Critical Care performed: No  Procedures   MEDICATIONS ORDERED IN ED: Medications  sodium chloride  0.9 % bolus 1,000 mL (0 mLs Intravenous Stopped 09/26/24 0123)    External physician / consultants:  I spoke with [name] of vascular  surgery regarding care plan for this patient.   IMPRESSION / MDM / ASSESSMENT AND PLAN / ED COURSE  I reviewed the triage vital signs and the nursing notes.                                Patient's presentation is most consistent with acute presentation with potential threat to life or bodily function.  Differential diagnosis includes, but is not limited to, ischemia, peripheral vascular disease, pain due to compressive sleeve, DVT, considered but less likely bony injuries, septic joint   The patient is on the cardiac monitor to evaluate for evidence of arrhythmia and/or significant heart rate changes.  MDM:    Known peripheral vascular disease with worsening pain in the area underneath his new compressive sleeve.  He is starting to feel better and the area appears much improved after taking off the sleeve.  I considered ischemia or critical limb ischemia but this is less less likely with exam as above, and especially with angiography with no targeted intervention noted just earlier today.  Considered DVT will get a DVT ultrasound.  I spoke with vascular surgery regarding the plan, no need for advanced imaging at this time given the radiography just performed earlier today.  If DVT negative, I will have him follow-up with his doctor to trial a different sleeve.  He understands return with any new or worsening and also to follow-up with vascular surgery.  I considered hospitalization for admission or observation however given unremarkable workup, improved symptoms after the compression sleeve removed, I think he can be discharged to the doubt life-threatening or limb threatening illness at this time.        FINAL CLINICAL IMPRESSION(S) / ED DIAGNOSES   Final diagnoses:  Right leg pain     Rx / DC Orders   ED Discharge Orders     None        Note:  This document was prepared using Dragon voice recognition software and may include unintentional dictation errors.    Cyrena Mylar, MD 09/26/24 [phone]

## 2024-09-26 NOTE — ED Notes (Signed)
 Pt discharged to home.  AVS reviewed and pt verbalized understanding.  Pt has no questions at this time.

## 2024-09-27 ENCOUNTER — Ambulatory Visit: Admitting: Urology

## 2024-09-27 ENCOUNTER — Encounter (INDEPENDENT_AMBULATORY_CARE_PROVIDER_SITE_OTHER): Payer: Self-pay | Admitting: Vascular Surgery

## 2024-09-27 ENCOUNTER — Ambulatory Visit (INDEPENDENT_AMBULATORY_CARE_PROVIDER_SITE_OTHER): Admitting: Vascular Surgery

## 2024-09-27 VITALS — BP 116/63 | HR 77 | Resp 18

## 2024-09-27 DIAGNOSIS — I701 Atherosclerosis of renal artery: Secondary | ICD-10-CM

## 2024-09-27 DIAGNOSIS — E1151 Type 2 diabetes mellitus with diabetic peripheral angiopathy without gangrene: Secondary | ICD-10-CM

## 2024-09-27 DIAGNOSIS — I10 Essential (primary) hypertension: Secondary | ICD-10-CM

## 2024-09-27 DIAGNOSIS — I2510 Atherosclerotic heart disease of native coronary artery without angina pectoris: Secondary | ICD-10-CM

## 2024-09-27 DIAGNOSIS — I739 Peripheral vascular disease, unspecified: Secondary | ICD-10-CM

## 2024-09-27 DIAGNOSIS — R3129 Other microscopic hematuria: Secondary | ICD-10-CM | POA: Insufficient documentation

## 2024-09-27 DIAGNOSIS — I70222 Atherosclerosis of native arteries of extremities with rest pain, left leg: Secondary | ICD-10-CM

## 2024-09-27 NOTE — Assessment & Plan Note (Addendum)
 11-20 RBC w/ significant sterile pyuria  Bilateral nonobstructive renal stones  Significant PAD on DAPT  Asymptomatic microhematuria [facility]) is defined by the AUA as >=3 RBC/HPF on a single properly collected urinalysis, excluding benign causes. Current AUA (2020, amended 2025) recommend risk stratification into low, intermediate, and high groups based on age, sex, smoking history, degree of hematuria, and other urothelial cancer risk factors. This patient classifies as high-risk AMH. Workup for AMH is directed by risk stratification and shared patient decision making, tailored to balance early detection of urothelial malignancy with avoidance of necessary or invasive testing.    - Reflex urine culture again today   -Will hold on antibiotics as he is asymptomatic - repeat BMP, ensure GFR > 40 - proceed with CTU and office cystoscopy

## 2024-09-27 NOTE — Progress Notes (Signed)
 10/04/24 10:47 AM   Jordan Arellano 07-Aug-1951 [id number]   HPI: 73 y.o. male here for initial evaluation of microhematuria UA (07/09/2024)- +LE, WBC TNTC, 11-20 RBC, rare bacteria   -Culture = negative UA today - >30 WBC, >30 RBC moderate bacteria, nitrite positive  Pleasant gentleman, but does not offer much medical history Denies any urologic issues, no prior GU conditions or surgeries Asymptomatic from GU standpoint today No history of UTIs, nephrolithiasis, prostatitis, GH  Lifelong smoker, >40-50 pack years (quit about 6 months ago) Denies family history of GU malignancies  History of peripheral vascular disease (on DAPT), right sided BKA, prior left lower extremity vascular bypass, TIA, stroke, depression   PMH: Past Medical History:  Diagnosis Date   Acute hematogenous osteomyelitis of left foot (HCC) 05/19/2023   Basal cell carcinoma 08/11/2022   R forearm - ED&C   Cessation of tobacco use in previous 12 months 09/01/2023   Depression    Foot ulcer (HCC) 12/12/2016   Hyperlipidemia    Hypertension    PAD (peripheral artery disease) ~2007   s/p R BKA for non-healing wound   Protein-calorie malnutrition, severe 05/24/2023   Stroke (HCC) 03/2014   MRI: Acute nonhemorrhagic left paracentral pontine infarct. 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   TIA (transient ischemic attack) 01/2014    Surgical History: Past Surgical History:  Procedure Laterality Date   AMPUTATION TOE Left 05/21/2023   Procedure: AMPUTATION LEFT 1st & 3rd TOES;  Surgeon: Neill Boas, DPM;  Location: [facility] ORS;  Service: Podiatry;  Laterality: Left;   BELOW KNEE LEG AMPUTATION Right    FEMORAL-POPLITEAL BYPASS GRAFT Left 12/17/2016   Procedure: BYPASS LEFT FEMORAL TO BELOW POPLITEAL ARTERY USING PROPATEN GORE GRAFT;  Surgeon: Boas JULIANNA Doing, MD;  Location: [facility] OR;  Service: Vascular;  Laterality: Left;   FEMORAL-POPLITEAL  BYPASS GRAFT Left 09/30/2017   Procedure: LEFT LEG ANGIOGRAM,  THROMBECTOMY, FEM-POPLITEAL BYPASS GRAFT, tHROMBECTOMY PERONEAL ARTERY AND POSTERIOR TIBIAL , ENDARTERECTOMY TIBIAL/PERONEAL TRUNK WITH BOVINE PATCH ANGIOPLASTY.;  Surgeon: Laurence Redell CROME, MD;  Location: [facility] OR;  Service: Vascular;  Laterality: Left;   INTRAMEDULLARY (IM) NAIL INTERTROCHANTERIC Right 03/21/2023   Procedure: INTRAMEDULLARY (IM) NAIL INTERTROCHANTERIC;  Surgeon: Cleotilde Barrio, MD;  Location: [facility] ORS;  Service: Orthopedics;  Laterality: Right;   LOWER EXTREMITY ANGIOGRAPHY Left 12/14/2022   Procedure: Lower Extremity Angiography;  Surgeon: Jama Cordella MATSU, MD;  Location: [facility] INVASIVE CV LAB;  Service: Cardiovascular;  Laterality: Left;   LOWER EXTREMITY ANGIOGRAPHY Left 05/20/2023   Procedure: Lower Extremity Angiography;  Surgeon: Marea Selinda RAMAN, MD;  Location: [facility] INVASIVE CV LAB;  Service: Cardiovascular;  Laterality: Left;   LOWER EXTREMITY ANGIOGRAPHY Left 05/26/2023   Procedure: Lower Extremity Angiography;  Surgeon: Marea Selinda RAMAN, MD;  Location: [facility] INVASIVE CV LAB;  Service: Cardiovascular;  Laterality: Left;   LOWER EXTREMITY ANGIOGRAPHY Left 09/25/2024   Procedure: Lower Extremity Angiography;  Surgeon: Jama Cordella MATSU, MD;  Location: [facility] INVASIVE CV LAB;  Service: Cardiovascular;  Laterality: Left;   LOWER EXTREMITY INTERVENTION  05/20/2023   Procedure: LOWER EXTREMITY INTERVENTION;  Surgeon: Marea Selinda RAMAN, MD;  Location: [facility] INVASIVE CV LAB;  Service: Cardiovascular;;   LOWER EXTREMITY INTERVENTION Left 09/25/2024   Procedure: LOWER EXTREMITY INTERVENTION;  Surgeon: Jama Cordella MATSU, MD;  Location: [facility] INVASIVE CV LAB;  Service: Cardiovascular;  Laterality: Left;   PERIPHERAL VASCULAR CATHETERIZATION Left 12/14/2016   Procedure: Abdominal Aortogram w/Lower Extremity;  Surgeon:  Lonni GORMAN Blade, MD;  Location[facility] INVASIVE CV LAB;  Service: Cardiovascular;  Laterality: Left;   right cataract extraction      TRANSTHORACIC ECHOCARDIOGRAM  01/2014   To evaluate possible CVA: EF 55-60%. GR 1 DD. No significant valvular lesions    Family History: Family History  Problem Relation Age of Onset   Hypertension Mother        Does not know history   Heart disease Mother    Stroke Mother    Diabetes Mother    Hypertension Father    Heart disease Father    Stroke Father    Diabetes Sister    Hypertension Sister    Heart disease Brother    Hypertension Brother     Social History:  reports that he has been smoking cigarettes. He has a 900 pack-year smoking history. He has never used smokeless tobacco. He reports that he does not drink alcohol and does not use drugs.      Physical Exam: BP 108/61   Pulse 82   Ht 6' 1 (1.854 m)   Wt 153 lb 12.8 oz (69.8 kg)   BMI 20.29 kg/m    Constitutional:  Alert and oriented, No acute distress. Cardiovascular: No clubbing, cyanosis, or edema. Respiratory: Normal respiratory effort, no increased work of breathing. GI: Nondistended Skin: No rashes, bruises or suspicious lesions. Neurologic: Grossly intact, no focal deficits, moving all 4 extremities. Psychiatric: Normal mood and affect.  Laboratory Data: Baseline creatinine 1.0-1.4   - Most recent creatinine 1.9 (09/25/2024)   Pertinent Imaging: RBUS (May 2025)- Bilateral nonobstructing renal stones, measuring up to 9 mm on the right    Assessment & Plan:    Microhematuria Assessment & Plan: 11-20 RBC w/ significant sterile pyuria  Bilateral nonobstructive renal stones  Significant PAD on DAPT  Asymptomatic microhematuria (AMH) is defined by the AUA as >=3 RBC/HPF on a single properly collected urinalysis, excluding benign causes. Current AUA (2020, amended 2025) recommend risk stratification into low, intermediate, and high groups based on age, sex, smoking history, degree of hematuria, and other urothelial cancer risk factors. This patient classifies as high-risk AMH. Workup for AMH is  directed by risk stratification and shared patient decision making, tailored to balance early detection of urothelial malignancy with avoidance of necessary or invasive testing.    - Reflex urine culture again today   -Will hold on antibiotics as he is asymptomatic - repeat BMP, ensure GFR > 40 - proceed with CTU and office cystoscopy   Orders: -     Urinalysis, Complete -     Urine Culture; Future -     CT HEMATURIA WORKUP; Future -     Basic metabolic panel with GFR      Penne Skye, MD 10/04/2024  [facility] [address] [place], [place] [address] [phone]

## 2024-09-27 NOTE — Progress Notes (Signed)
 MRN : [id number]  Jordan Arellano is a 73 y.o. (1951-07-08) male who presents with chief complaint of check circulation.  History of Present Illness:   The patient returns to the office for followup and review status post angiogram with intervention on 09/25/2024.   Procedure: Diagnostic angiogram which showed:  The patient will require a left femoral to tibial bypass it appears to be the posterior tibial but on table angiography should be performed at the time of surgery given the anomalous distal outflow.  There is documentation that he has had previous vein mapping which was not adequate for bypass in which case a distal flow graft would be the likely choice.   On the right the patient should undergo endarterectomy of the common femoral and profunda femoris given the extensive nature of the disease if not treated he would likely progress to an above-knee amputation.   Given the redo and extensive nature of both of these situations they would be done separately.  The patient notes he is having significant rest pain symptoms right side is worse than the left. No new ulcers or wounds have occurred since the last visit.  There have been no significant changes to the patient's overall health care.  No documented history of amaurosis fugax or recent TIA symptoms. There are no recent neurological changes noted. No documented history of DVT, PE or superficial thrombophlebitis. The patient denies recent episodes of angina or shortness of breath.    No outpatient medications have been marked as taking for the 09/27/24 encounter (Appointment) with Jama, Cordella MATSU, MD.    Past Medical History:  Diagnosis Date   Acute hematogenous osteomyelitis of left foot (HCC) 05/19/2023   Basal cell carcinoma 08/11/2022   R forearm - ED&C   Cessation of tobacco use in previous 12 months 09/01/2023   Depression    Foot ulcer (HCC)  12/12/2016   Hyperlipidemia    Hypertension    PAD (peripheral artery disease) ~2007   s/p R BKA for non-healing wound   Protein-calorie malnutrition, severe 05/24/2023   Stroke (HCC) 03/2014   MRI: Acute nonhemorrhagic left paracentral pontine infarct. 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   TIA (transient ischemic attack) 01/2014    Past Surgical History:  Procedure Laterality Date   AMPUTATION TOE Left 05/21/2023   Procedure: AMPUTATION LEFT 1st & 3rd TOES;  Surgeon: Neill Boas, DPM;  Location: [facility] ORS;  Service: Podiatry;  Laterality: Left;   BELOW KNEE LEG AMPUTATION Right    FEMORAL-POPLITEAL BYPASS GRAFT Left 12/17/2016   Procedure: BYPASS LEFT FEMORAL TO BELOW POPLITEAL ARTERY USING PROPATEN GORE GRAFT;  Surgeon: Boas JULIANNA Doing, MD;  Location: [facility] OR;  Service: Vascular;  Laterality: Left;   FEMORAL-POPLITEAL BYPASS GRAFT Left 09/30/2017   Procedure: LEFT LEG ANGIOGRAM,  THROMBECTOMY, FEM-POPLITEAL BYPASS GRAFT, tHROMBECTOMY PERONEAL ARTERY AND POSTERIOR TIBIAL , ENDARTERECTOMY TIBIAL/PERONEAL TRUNK WITH BOVINE PATCH ANGIOPLASTY.;  Surgeon: Laurence Redell CROME, MD;  Location: [facility] OR;  Service: Vascular;  Laterality: Left;   INTRAMEDULLARY (IM) NAIL INTERTROCHANTERIC Right 03/21/2023  Procedure: INTRAMEDULLARY (IM) NAIL INTERTROCHANTERIC;  Surgeon: Cleotilde Barrio, MD;  Location: [facility];  Service: Orthopedics;  Laterality: Right;   LOWER EXTREMITY ANGIOGRAPHY Left 12/14/2022   Procedure: Lower Extremity Angiography;  Surgeon: Jama Cordella MATSU, MD;  Location: [facility];  Service: Cardiovascular;  Laterality: Left;   LOWER EXTREMITY ANGIOGRAPHY Left 05/20/2023   Procedure: Lower Extremity Angiography;  Surgeon: Marea Selinda RAMAN, MD;  Location: [facility];  Service: Cardiovascular;  Laterality: Left;   LOWER EXTREMITY ANGIOGRAPHY Left 05/26/2023   Procedure: Lower Extremity Angiography;  Surgeon: Marea Selinda RAMAN, MD;  Location: [facility];  Service: Cardiovascular;  Laterality: Left;   LOWER EXTREMITY ANGIOGRAPHY Left 09/25/2024   Procedure: Lower Extremity Angiography;  Surgeon: Jama Cordella MATSU, MD;  Location: [facility];  Service: Cardiovascular;  Laterality: Left;   LOWER EXTREMITY INTERVENTION  05/20/2023   Procedure: LOWER EXTREMITY INTERVENTION;  Surgeon: Marea Selinda RAMAN, MD;  Location: [facility];  Service: Cardiovascular;;   LOWER EXTREMITY INTERVENTION Left 09/25/2024   Procedure: LOWER EXTREMITY INTERVENTION;  Surgeon: Jama Cordella MATSU, MD;  Location: [facility];  Service: Cardiovascular;  Laterality: Left;   PERIPHERAL VASCULAR CATHETERIZATION Left 12/14/2016   Procedure: Abdominal Aortogram w/Lower Extremity;  Surgeon: Lonni RAMAN Blade, MD;  Location[facility] CV [facility];  Service: Cardiovascular;  Laterality: Left;   right cataract extraction     TRANSTHORACIC ECHOCARDIOGRAM  01/2014   To evaluate possible CVA: EF 55-60%. GR 1 DD. No significant valvular lesions    Social History Social History   Tobacco Use   Smoking status: Some Days    Current packs/day: 15.00    Average packs/day: 15.0 packs/day for 60.0 years (900.0 ttl pk-yrs)    Types: Cigarettes   Smokeless tobacco: Never  Vaping Use   Vaping status: Never Used  Substance Use Topics   Alcohol use: No   Drug use: No    Family History Family History  Problem Relation Age of Onset   Hypertension Mother        Does not know history   Heart disease Mother    Stroke Mother    Diabetes Mother    Hypertension Father    Heart disease Father    Stroke Father    Diabetes Sister    Hypertension Sister    Heart disease Brother    Hypertension Brother     No Known Allergies   REVIEW OF SYSTEMS (Negative unless checked)  Constitutional: [] Weight loss  [] Fever  [] Chills Cardiac: [] Chest pain   [] Chest pressure   [] Palpitations   [] Shortness of breath when laying  flat   [] Shortness of breath with exertion. Vascular:  [x] Pain in legs with walking   [] Pain in legs at rest  [] History of DVT   [] Phlebitis   [] Swelling in legs   [] Varicose veins   [] Non-healing ulcers Pulmonary:   [] Uses home oxygen   [] Productive cough   [] Hemoptysis   [] Wheeze  [] COPD   [] Asthma Neurologic:  [] Dizziness   [] Seizures   [] History of stroke   [] History of TIA  [] Aphasia   [] Vissual changes   [] Weakness or numbness in arm   [] Weakness or numbness in leg Musculoskeletal:   [] Joint swelling   [] Joint pain   [] Low back pain Hematologic:  [] Easy bruising  [] Easy bleeding   [] Hypercoagulable state   [] Anemic Gastrointestinal:  [] Diarrhea   [] Vomiting  [] Gastroesophageal reflux/heartburn   [] Difficulty swallowing. Genitourinary:  [] Chronic kidney disease   []   Difficult urination  [] Frequent urination   [] Blood in urine Skin:  [] Rashes   [] Ulcers  Psychological:  [] History of anxiety   []  History of major depression.  Physical Examination  There were no vitals filed for this visit. There is no height or weight on file to calculate BMI. Gen: WD/WN, NAD Head: [place]/AT, No temporalis wasting.  Ear/Nose/Throat: Hearing grossly intact, nares w/o erythema or drainage Eyes: PER, EOMI, sclera nonicteric.  Neck: Supple, no masses.  No bruit or JVD.  Pulmonary:  Good air movement, no audible wheezing, no use of accessory muscles.  Cardiac: RRR, normal S1, S2, no Murmurs. Vascular:  mild trophic changes, no open wounds Vessel Right Left  Radial Palpable Palpable  PT Not Palpable Not Palpable  DP Not Palpable Not Palpable  Gastrointestinal: soft, non-distended. No guarding/no peritoneal signs.  Musculoskeletal: M/S 5/5 throughout.  No visible deformity.  Neurologic: CN 2-12 intact. Pain and light touch intact in extremities.  Symmetrical.  Speech is fluent. Motor exam as listed above. Psychiatric: Judgment intact, Mood & affect appropriate for pt's clinical situation. Dermatologic: No  rashes or ulcers noted.  No changes consistent with cellulitis.   CBC Lab Results  Component Value Date   WBC 17.1 (H) 09/25/2024   HGB 12.8 (L) 09/25/2024   HCT 39.9 09/25/2024   MCV 93.9 09/25/2024   PLT 174 09/25/2024    BMET    Component Value Date/Time   NA 138 09/25/2024 2327   NA 139 02/03/2016 0000   K 4.1 09/25/2024 2327   CL 101 09/25/2024 2327   CO2 23 09/25/2024 2327   GLUCOSE 165 (H) 09/25/2024 2327   BUN 25 (H) 09/25/2024 2327   BUN 28 (A) 02/03/2016 0000   CREATININE 1.91 (H) 09/25/2024 2327   CALCIUM  8.9 09/25/2024 2327   GFRNONAA 37 (L) 09/25/2024 2327   GFRAA >60 10/02/2017 0943   Estimated Creatinine Clearance: 33.6 mL/min (A) (by C-G formula based on SCr of 1.91 mg/dL (H)).  COAG Lab Results  Component Value Date   INR 1.1 05/18/2023   INR 1.1 03/21/2023   INR 1.00 09/30/2017    Radiology US  Venous Img Lower Unilateral Right Result Date: 09/26/2024 EXAM: ULTRASOUND DUPLEX OF THE RIGHT LOWER EXTREMITY VEINS TECHNIQUE: Duplex ultrasound using B-mode/gray scaled imaging and Doppler spectral analysis and color flow was obtained of the deep venous structures of the right lower extremity. COMPARISON: None. CLINICAL HISTORY: Pain behind knee. FINDINGS: The common femoral vein, femoral vein, and popliteal vein of the right lower extremity demonstrate normal compressibility with normal color flow and spectral analysis. Surgically absent calf veins. IMPRESSION: 1. No evidence of DVT. Electronically signed by: Norman Gatlin MD 09/26/2024 01:13 AM EDT RP Workstation: [id number]   PERIPHERAL VASCULAR CATHETERIZATION Result Date: 09/25/2024 See surgical note for result.  VAS US  LOWER EXTREMITY ARTERIAL DUPLEX Result Date: 09/07/2024 LOWER EXTREMITY ARTERIAL DUPLEX STUDY Patient Name:  Jordan Arellano  Date of Exam:   09/05/2024 Medical Rec #: [id number]      Accession #:    [id number] Date of Birth: November 04, 1951      Patient Gender: M Patient Age:   27 years Exam  Location:  [facility] Procedure:      VAS US  LOWER EXTREMITY ARTERIAL DUPLEX Referring Phys: [name] --------------------------------------------------------------------------------  Indications: Claudication, and peripheral artery disease. High Risk Factors: Hypertension, hyperlipidemia.  Vascular Interventions: 05/26/2023 Left PTA and tib per PTA. Left SFA and tib  per stent                          12/14/2022 Bilateral common iliac artery kissing stents. Current ABI:            Not obtained Performing Technologist: Leafy Gibes RVS  Examination Guidelines: A complete evaluation includes B-mode imaging, spectral Doppler, color Doppler, and power Doppler as needed of all accessible portions of each vessel. Bilateral testing is considered an integral part of a complete examination. Limited examinations for reoccurring indications may be performed as noted.   +-----------+--------+-----+--------+------------------+---------------+ LEFT       PSV cm/sRatioStenosisWaveform          Comments        +-----------+--------+-----+--------+------------------+---------------+ CFA Distal 0                    Absent                            +-----------+--------+-----+--------+------------------+---------------+ DFA        76                   biphasic                          +-----------+--------+-----+--------+------------------+---------------+ SFA Prox   0                    Absent                            +-----------+--------+-----+--------+------------------+---------------+ SFA Mid    0                    Absent                            +-----------+--------+-----+--------+------------------+---------------+ SFA Distal 0                    Absent                            +-----------+--------+-----+--------+------------------+---------------+ POP Distal 0                    Absent                             +-----------+--------+-----+--------+------------------+---------------+ ATA Distal 0                    Absent                            +-----------+--------+-----+--------+------------------+---------------+ PTA Distal 16                   Monophasic                        +-----------+--------+-----+--------+------------------+---------------+ PERO Distal9                    Blunted MonophasicRetrograde flow +-----------+--------+-----+--------+------------------+---------------+  Summary: Left: The Left Lower Extremity Arterial system appears to be Occluded from the CFA to the Level of the Ankle.  See table(s) above for measurements and observations. Electronically signed by  Cordella Shawl MD on 09/07/2024 at 9:33:43 AM.    Final      Assessment/Plan 1. Atherosclerosis of native artery of left lower extremity with rest pain (HCC) (Primary)  Recommend:  The patient has evidence of severe atherosclerotic changes of both lower extremities associated with rest pain and impending tissue loss of the right foot.  This represents a limb threatening ischemia and places the patient at a high risk for limb loss.  Angiography has been performed and the situation is not ideal for intervention.  Given this finding open surgical repair is recommended.   Patient should undergo arterial reconstruction, right femoral endarterectomy of the right lower extremity with the hope for limb salvage.  The risks and benefits as well as the alternative therapies was discussed in detail with the patient.  All questions were answered.  Patient agrees to proceed with open vascular surgical reconstruction.  The patient will follow up with me in the office after the procedure.   2. PAD s/p right BKA, history revascularization left leg I believe surgery is indicated as he has profound common femoral and profunda femoris disease and this is his only perfusion to his amputation site.  If this were to progress  to occlusion he would likely require a very high level above-knee amputation.  3. Left renal artery stenosis Recommend:  The patient has evidence of atherosclerotic changes of the renal artery.  At this time the patient's blood pressure is fairly well controlled.  Patient does not need angiography of the renal artery given the good control of his hypertension.    However, if at any point the patient's BP becomes acutely worse then further evaluation of the renal artery stenosis and possible intervention would be encouraged.  The patient voices understanding of this plan and agrees.  Patient will follow-up here in the office with renal artery duplex ultrasound as ordered.    4. Essential hypertension Continue antihypertensive medications as already ordered, these medications have been reviewed and there are no changes at this time.  5. Atherosclerosis of native coronary artery of native heart without angina pectoris Continue cardiac and antihypertensive medications as already ordered and reviewed, no changes at this time.  Continue statin as ordered and reviewed, no changes at this time  Nitrates PRN for chest pain  6. DM (diabetes mellitus) with peripheral vascular complication (HCC) Continue hypoglycemic medications as already ordered, these medications have been reviewed and there are no changes at this time.  Hgb A1C to be monitored as already arranged by primary service    Cordella Shawl, MD  09/27/2024 9:16 AM

## 2024-10-01 ENCOUNTER — Telehealth (INDEPENDENT_AMBULATORY_CARE_PROVIDER_SITE_OTHER): Payer: Self-pay

## 2024-10-01 NOTE — Telephone Encounter (Signed)
 It looks like he has placed a referral today.  Hopefully they will get with her soon

## 2024-10-01 NOTE — Telephone Encounter (Signed)
 Call to [facility] to advise below, she verbalized understanding.

## 2024-10-01 NOTE — Telephone Encounter (Signed)
 Call from Arlean Pinal pt's sister on nurse line. She reports she was following up per Dr. Jama to see that contact had been made with Cardiology since last visit so that she may coordinate needs for scheduling of his upcoming procedures. Advised her that I would forward this information along for her.

## 2024-10-02 ENCOUNTER — Ambulatory Visit

## 2024-10-04 ENCOUNTER — Encounter: Payer: Self-pay | Admitting: Urology

## 2024-10-04 ENCOUNTER — Other Ambulatory Visit: Payer: Self-pay

## 2024-10-04 ENCOUNTER — Ambulatory Visit: Admitting: Urology

## 2024-10-04 VITALS — BP 108/61 | HR 82 | Ht 73.0 in | Wt 153.8 lb

## 2024-10-04 DIAGNOSIS — R3129 Other microscopic hematuria: Secondary | ICD-10-CM

## 2024-10-04 LAB — MICROSCOPIC EXAMINATION
RBC, Urine: >30 /HPF — AB (ref 0–2)
WBC, UA: >30 /HPF — AB (ref 0–5)

## 2024-10-04 LAB — URINALYSIS, COMPLETE
Bilirubin, UA: NEGATIVE
Ketones, UA: NEGATIVE
Nitrite, UA: POSITIVE — AB
Specific Gravity, UA: 1.02 (ref 1.005–1.030)
Urobilinogen, Ur: 0.2 mg/dL (ref 0.2–1.0)
pH, UA: 6 (ref 5.0–7.5)

## 2024-10-04 NOTE — Addendum Note (Signed)
 Addended by: Idolina Mantell E on: 10/04/2024 12:00 PM   Modules accepted: Orders

## 2024-10-04 NOTE — Patient Instructions (Signed)
 Please call [phone]  to schedule your imaging prior to your appointment.

## 2024-10-07 ENCOUNTER — Encounter (INDEPENDENT_AMBULATORY_CARE_PROVIDER_SITE_OTHER): Payer: Self-pay | Admitting: Vascular Surgery

## 2024-10-09 LAB — CULTURE, URINE COMPREHENSIVE

## 2024-10-15 ENCOUNTER — Ambulatory Visit
Admission: RE | Admit: 2024-10-15 | Discharge: 2024-10-15 | Disposition: A | Discharge status: Home / Self Care | Source: Ambulatory Visit | Attending: Urology | Admitting: Urology

## 2024-10-15 DIAGNOSIS — R3129 Other microscopic hematuria: Secondary | ICD-10-CM | POA: Insufficient documentation

## 2024-10-15 MED ORDER — IOHEXOL 300 MG/ML  SOLN: 100.0000 mL | Freq: Once | INTRAMUSCULAR | Status: DC | PRN

## 2024-10-15 MED ORDER — IOHEXOL 300 MG/ML  SOLN
80.0000 mL | Freq: Once | INTRAMUSCULAR | Status: AC | PRN
  Administered 2024-10-15: 80 mL via INTRAVENOUS

## 2024-10-17 ENCOUNTER — Other Ambulatory Visit: Payer: Self-pay

## 2024-10-17 DIAGNOSIS — R3129 Other microscopic hematuria: Secondary | ICD-10-CM

## 2024-10-17 NOTE — Addendum Note (Signed)
 Addended by: CLEOTILDE HELLER L on: 10/17/2024 10:31 AM   Modules accepted: Orders

## 2024-10-18 ENCOUNTER — Ambulatory Visit (INDEPENDENT_AMBULATORY_CARE_PROVIDER_SITE_OTHER): Admitting: Vascular Surgery

## 2024-10-18 ENCOUNTER — Other Ambulatory Visit

## 2024-10-18 DIAGNOSIS — R3129 Other microscopic hematuria: Secondary | ICD-10-CM

## 2024-10-19 LAB — BASIC METABOLIC PANEL WITH GFR
BUN/Creatinine Ratio: 16 (ref 10–24)
BUN: 29 mg/dL — ABNORMAL HIGH (ref 8–27)
CO2: 20 mmol/L (ref 20–29)
Calcium: 9.2 mg/dL (ref 8.6–10.2)
Chloride: 103 mmol/L (ref 96–106)
Creatinine, Ser: 1.79 mg/dL — ABNORMAL HIGH (ref 0.76–1.27)
Glucose: 107 mg/dL — ABNORMAL HIGH (ref 70–99)
Potassium: 4.5 mmol/L (ref 3.5–5.2)
Sodium: 138 mmol/L (ref 134–144)
eGFR: 40 mL/min/1.73 — ABNORMAL LOW (ref >59)

## 2024-10-23 ENCOUNTER — Telehealth: Payer: Self-pay

## 2024-10-23 NOTE — Telephone Encounter (Signed)
 Patient's sister is calling asking to speak to the office manager. Please advise

## 2024-10-24 NOTE — Progress Notes (Deleted)
   10/31/2024 8:34 AM   Jordan Arellano 10/27/1951 [id number]  Cystoscopy Procedure Note:  Indication:  11-20 RBC w/ significant sterile pyuria  Bilateral nonobstructive renal stones  Significant PAD on DAPT  After informed consent and discussion of the procedure and its risks, Jordan Arellano was positioned and prepped in the standard fashion. Cystoscopy was performed with a flexible cystoscope. The urethra, bladder neck and bladder mucosa were visualized in a systematic fashion. The ureteral orifices were noted in orthotopic location and orientation. There were no bladder mucosal lesions, stones, debris or anatomic variants noted. The prostate gland was {bglistcystoprostatefindings:[id number]}.  Imaging: Recent CTU  (10/17/24) -  IMPRESSION: 1. Multiple bilateral nonobstructive renal calculi, including an elongated calculus in the superior pole calices of the right kidney measuring 1.5 cm, as well as multifocal bilateral renal cortical scarring, particularly involving the inferior pole of the right kidney. No ureteral calculi or hydronephrosis. 2. No mass or suspicious contrast enhancement of the kidneys or urinary tract. No urinary tract filling defect on delayed phase imaging.  Findings: ***  Assessment and Plan: AHM likely secondary to bilateral renal stone burden, up to ~1.5cm RUP  - Follow up in clinic for discussion of stone management. Consider Right URS/LL   Penne Skye, MD 10/24/2024

## 2024-10-24 NOTE — Telephone Encounter (Signed)
 Sister Cyrilla) called to speak with the Print Production Planner.

## 2024-10-24 NOTE — Telephone Encounter (Signed)
 Spoke with the nurse at [facility]. She stated that she had sent the preop clearance this morning to the onbase portal. As of right now, this had not been received.  I proceeded to go to the [facility] office to ask if this could be printed so we can proceed to expedite this process for the patient.  The cardiac clearance has been left for Dr. Argentina to review tomorrow. The sister has been updated and has been advised that his nurse will reach out to her tomorrow as to whether or not the patient needs to be seen in the office prior to cardiac clearance being given.   The sister was appreciative of the extra effort to help expedite this for her brother.

## 2024-10-24 NOTE — Telephone Encounter (Signed)
 Returned the call to the patient's sister per the DPR. She was calling regarding concerns about the patient and a referral that was sent.  The sister explained that the patient is awaiting pre-op clearance from cardiology for a procedure to be scheduled with vascular surgery. She stated that a "referral" was placed by the vascular office on 10/01/24 and that she has called that office more than 15 times for a status update. She reported being told by the vascular office that they submitted the referral on 10/01/24.  For clarification, the sister was asked whether the patient needed pre-op clearance specifically or a new appointment. A review showed that a new patient referral was placed with our office on 10/01/24 by the vascular office, and our office subsequently called to schedule an appointment. The patient is scheduled for a new patient appointment on 10/29/24.  The sister stated that the patient does not need a new patient visit and only needs pre-op clearance. It was explained that although the vascular surgeon's office sent a new patient referral for pre-op clearance, the patient is already established in our office, so a new patient referral was not necessary. What we needed instead was the pre-op clearance request.  The sister remained upset about the delay and expressed that it should have been recognized that the patient was already established and did not need a new patient appointment. An apology was given, and she was reassured that we will contact the vascular office to request the appropriate pre-op clearance so the process can be expedited.  Multiple calls have been placed to the vascular office, but each call has gone to voicemail. A voicemail was left requesting that they send the pre-op clearance. The sister was informed that the patient may still need to be seen on 10/29/24 for an EKG and pre-op evaluation, as his last visit with us  was in August.

## 2024-10-24 NOTE — Telephone Encounter (Signed)
   Pre-operative Risk Assessment    Patient Name: Jordan Arellano  DOB: 1951-06-10 MRN: [id number]   Date of last office visit: 07/27/2024 Date of next office visit: 10/29/24   Request for Surgical Clearance    Procedure:  Right Femoral Endarterectomy   Date of Surgery:  Clearance TBD                                Surgeon:  Dr. Cordella Shawl Surgeon's Group or Practice Name:  [facility] Phone number:  [phone] Fax number:  [phone]   Type of Clearance Requested:   - Medical    Type of Anesthesia:  Not Indicated     Signed, Olam CINDERELLA Bunker   10/24/2024, 11:26 AM

## 2024-10-25 ENCOUNTER — Other Ambulatory Visit: Admitting: Urology

## 2024-10-25 NOTE — Telephone Encounter (Signed)
 The patient's sister has been made aware and will have the patient here on 11/24.

## 2024-10-25 NOTE — Telephone Encounter (Signed)
 This encounter was created in error - please disregard.

## 2024-10-27 NOTE — Progress Notes (Unsigned)
  Cardiology Office Note   Date:  10/29/2024  ID:  Jordan Arellano, DOB 1951-11-30, MRN [id number] PCP: Abbey Bruckner, MD  [facility] Providers Cardiologist:  Caron Poser, MD     History of Present Illness Jordan Arellano is a 73 y.o. male PMH prior CVA, DM2, HLD, HTN, PAD status post right BKA 2024 and multiple LLE revascularization procedures, renal artery stenosis, who presents perioperative risk assessment for an upcoming right femoral endarterectomy.  Patient reports he is overall doing well from a cardiovascular symptom standpoint.  He is not having any chest discomfort or shortness of breath.  He says he can generally do his ADLs and is only limited by leg pain for which he is being seen by vascular surgery for.  He tells me that he can climb up a flight of steps without stopping due to angina or shortness of breath.  Last LDL 36 06/2024.  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  Studies Reviewed I have independently reviewed the patient's ECG, prior CT scan, medical history, and prior echo report as well as recent blood work.  Physical Exam VS:  BP 122/70 (BP Location: Left Arm, Patient Position: Sitting, Cuff Size: Normal)   Pulse 76   Ht 6' 1 (1.854 m)   Wt 148 lb (67.1 kg)   SpO2 97%   BMI 19.53 kg/m        Wt Readings from Last 3 Encounters:  10/29/24 148 lb (67.1 kg)  10/04/24 153 lb 12.8 oz (69.8 kg)  09/25/24 149 lb 14.6 oz (68 kg)    GEN: No acute distress. NECK: No JVD; No carotid bruits. CARDIAC: RRR, no murmurs, rubs, gallops. RESPIRATORY:  Clear to auscultation. EXTREMITIES:  Warm and well-perfused. No edema.  ASSESSMENT AND  PLAN Perioperative risk assessment Patient's RCRI score is a 2 given high risk procedure and history of prior CVA indicating that he is intermediate perioperative cardiovascular risk for a high risk operation.  He denies any current cardiovascular complaints.  He is able to do greater than 4 METS without chest discomfort or dyspnea.  A prior echo has shown normal ejection fraction.  He was previously labeled as HFpEF but he has never actually had any heart failure syndrome, so I believe this was a misdiagnosis.  He does have coronary artery calcifications, but no ongoing angina or dyspnea.  His LDL is very well-controlled.  Given these findings, he is medically optimized and no further cardiac testing is indicated before femoral endarterectomy.  Severe coronary artery calcifications PAD with right BKA and left sided revascularizations HLD Last LDL 36.  Follows with vascular surgery.  Stable symptoms.  He is medically optimized.  Plan: - Continue ASA 81 mg daily and Plavix  75 mg daily - Continue Crestor  40 mg daily - Continue trental  400mg  TID   5.   Bifascicular block Present for some time now. Asymptomatic. If he starts developing any syncope or other symptoms of bradycardia, will need to get a monitor and consider PPM.        Dispo: RTC 1 year or sooner as needed  Signed, Caron Poser, MD

## 2024-10-29 ENCOUNTER — Ambulatory Visit

## 2024-10-29 VITALS — BP 122/70 | HR 76 | Ht 73.0 in | Wt 148.0 lb

## 2024-10-29 DIAGNOSIS — I70222 Atherosclerosis of native arteries of extremities with rest pain, left leg: Secondary | ICD-10-CM

## 2024-10-29 DIAGNOSIS — I739 Peripheral vascular disease, unspecified: Secondary | ICD-10-CM | POA: Diagnosis not present

## 2024-10-29 DIAGNOSIS — Z01810 Encounter for preprocedural cardiovascular examination: Secondary | ICD-10-CM | POA: Diagnosis not present

## 2024-10-29 DIAGNOSIS — I2510 Atherosclerotic heart disease of native coronary artery without angina pectoris: Secondary | ICD-10-CM | POA: Diagnosis not present

## 2024-10-29 DIAGNOSIS — E782 Mixed hyperlipidemia: Secondary | ICD-10-CM | POA: Diagnosis not present

## 2024-10-29 NOTE — Patient Instructions (Signed)
 Medication Instructions:   Your physician recommends that you continue on your current medications as directed. Please refer to the Current Medication list given to you today.  *If you need a refill on your cardiac medications before your next appointment, please call your pharmacy*  Lab Work:  NONE  If you have labs (blood work) drawn today and your tests are completely normal, you will receive your results only by: MyChart Message (if you have MyChart) OR A paper copy in the mail If you have any lab test that is abnormal or we need to change your treatment, we will call you to review the results.  Testing/Procedures:  NONE  Follow-Up: At [facility], you and your health needs are our priority.  As part of our continuing mission to provide you with exceptional heart care, our providers are all part of one team.  This team includes your primary Cardiologist (physician) and Advanced Practice Providers or APPs (Physician Assistants and Nurse Practitioners) who all work together to provide you with the care you need, when you need it.  Your next appointment:   12 month(s)  Provider:   You may see Caron Poser, MD or one of the following Advanced Practice Providers on your designated Care Team:   Lonni Meager, NP Lesley Maffucci, PA-C Bernardino Bring, PA-C Cadence Coqua, PA-C Tylene Lunch, NP Barnie Hila, NP   We recommend signing up for the patient portal called MyChart.  Sign up information is provided on this After Visit Summary.  MyChart is used to connect with patients for Virtual Visits (Telemedicine).  Patients are able to view lab/test results, encounter notes, upcoming appointments, etc.  Non-urgent messages can be sent to your provider as well.   To learn more about what you can do with MyChart, go to [email or web address].

## 2024-10-31 ENCOUNTER — Other Ambulatory Visit: Admitting: Urology

## 2024-10-31 ENCOUNTER — Telehealth (INDEPENDENT_AMBULATORY_CARE_PROVIDER_SITE_OTHER): Payer: Self-pay

## 2024-10-31 NOTE — Telephone Encounter (Signed)
 Spoke with the patient's sister and he is scheduled with Dr. Jama for a right femoral endarterectomy on 11/21/24 at [facility]. Pre-admit will call to schedule a pre-op at the [facility]. Pre-surgical instructions were discussed and will be mailed.

## 2024-10-31 NOTE — Progress Notes (Signed)
   11/05/2024 7:26 AM   Jenene Tanda 30-Aug-1951 [id number]  Cystoscopy Procedure Note:  Indication:  11-20 RBC w/ significant sterile pyuria  Bilateral nonobstructive renal stones  Significant PAD on DAPT  After informed consent and discussion of the procedure and its risks, Rowland Ericsson was positioned and prepped in the standard fashion. Cystoscopy was performed with a flexible cystoscope. The urethra, bladder neck and bladder mucosa were visualized in a systematic fashion.  Appeared to have mild diffuse bladder erythema, particularly along posterior wall, low-lying.  No discrete or visible bladder masses or tumors.  The ureteral orifices were noted in orthotopic location and orientation. There were no bladder mucosal lesions, stones, debris or anatomic variants noted. The prostate gland was bilobar with moderate occlusive hypertrophy.  Moderate to severe diffuse trabeculations with scattered cellules.  Considering erythema, urine barbotage cytology was collected.  Imaging: Recent CTU  (10/17/24) -  IMPRESSION: 1. Multiple bilateral nonobstructive renal calculi, including an elongated calculus in the superior pole calices of the right kidney measuring 1.5 cm, as well as multifocal bilateral renal cortical scarring, particularly involving the inferior pole of the right kidney. No ureteral calculi or hydronephrosis. 2. No mass or suspicious contrast enhancement of the kidneys or urinary tract. No urinary tract filling defect on delayed phase imaging.  Findings: Diffuse low-lying mild erythema along posterior bladder wall Diffuse trabeculations  Assessment and Plan: AHM likely secondary to bilateral renal stone burden, up to ~1.5cm RUP Significant cardiovascular comorbidity  - Follow-up urine cytology.  Consider bladder biopsy in OR if high-grade cytology - Follow up in 1 year with [facility], consider preemptive Right URS/LL if significant stone growth  Penne Skye,  MD 10/31/2024

## 2024-11-05 ENCOUNTER — Encounter: Payer: Self-pay | Admitting: Urology

## 2024-11-05 ENCOUNTER — Ambulatory Visit: Admitting: Urology

## 2024-11-05 VITALS — BP 129/75 | HR 87 | Wt 150.8 lb

## 2024-11-05 DIAGNOSIS — R3129 Other microscopic hematuria: Secondary | ICD-10-CM | POA: Diagnosis not present

## 2024-11-06 LAB — URINALYSIS, COMPLETE
Bilirubin, UA: NEGATIVE
Ketones, UA: NEGATIVE
Nitrite, UA: POSITIVE — AB
Specific Gravity, UA: 1.025 (ref 1.005–1.030)
Urobilinogen, Ur: 0.2 mg/dL (ref 0.2–1.0)
pH, UA: 6 (ref 5.0–7.5)

## 2024-11-06 LAB — MICROSCOPIC EXAMINATION
RBC, Urine: NONE SEEN /HPF (ref 0–2)
WBC, UA: >30 /HPF — ABNORMAL HIGH (ref 0–5)

## 2024-11-08 ENCOUNTER — Other Ambulatory Visit (INDEPENDENT_AMBULATORY_CARE_PROVIDER_SITE_OTHER): Payer: Self-pay | Admitting: Nurse Practitioner

## 2024-11-08 DIAGNOSIS — I70222 Atherosclerosis of native arteries of extremities with rest pain, left leg: Secondary | ICD-10-CM

## 2024-11-09 ENCOUNTER — Inpatient Hospital Stay: Admission: RE | Admit: 2024-11-09 | Discharge: 2024-11-09 | Attending: Vascular Surgery | Admitting: Vascular Surgery

## 2024-11-09 ENCOUNTER — Other Ambulatory Visit: Payer: Self-pay

## 2024-11-09 DIAGNOSIS — Z01818 Encounter for other preprocedural examination: Secondary | ICD-10-CM

## 2024-11-09 HISTORY — DX: Anemia in other chronic diseases classified elsewhere: D63.8

## 2024-11-09 HISTORY — DX: Presence of other vascular implants and grafts: Z95.828

## 2024-11-09 HISTORY — DX: Mild cognitive impairment of uncertain or unknown etiology: G31.84

## 2024-11-09 HISTORY — DX: Nausea with vomiting, unspecified: R11.2

## 2024-11-09 HISTORY — DX: Atherosclerosis of native arteries of extremities with rest pain, right leg: I70.221

## 2024-11-09 HISTORY — DX: Acquired absence of right leg below knee: Z89.511

## 2024-11-09 HISTORY — DX: Conductive hearing loss, unilateral, right ear, with unrestricted hearing on the contralateral side: H90.11

## 2024-11-09 HISTORY — DX: Atherosclerosis of renal artery: I70.1

## 2024-11-09 HISTORY — DX: Vitamin D deficiency, unspecified: E55.9

## 2024-11-09 HISTORY — DX: Atherosclerosis of native arteries of extremities with rest pain, unspecified extremity: I70.229

## 2024-11-09 HISTORY — DX: Calculus of gallbladder without cholecystitis without obstruction: K80.20

## 2024-11-09 HISTORY — DX: Atherosclerotic heart disease of native coronary artery without angina pectoris: I25.10

## 2024-11-09 HISTORY — DX: Calculus of kidney: N20.0

## 2024-11-09 HISTORY — DX: Type 2 diabetes mellitus without complications: E11.9

## 2024-11-09 HISTORY — DX: Bifascicular block: I45.2

## 2024-11-09 HISTORY — DX: Chronic kidney disease, stage 2 (mild): N18.2

## 2024-11-09 NOTE — Patient Instructions (Addendum)
 Your procedure is scheduled on:11-21-24 Wednesday Report to the Registration Desk on the 1st floor of the [facility].Then proceed to the 2nd floor Surgery Desk To find out your arrival time, please call [phone] between 1PM - 3PM on:11-20-24 Tuesday If your arrival time is 6:00 am, do not arrive before that time as the [facility] entrance doors do not open until 6:00 am.  REMEMBER: Instructions that are not followed completely may result in serious medical risk, up to and including death; or upon the discretion of your surgeon and anesthesiologist your surgery may need to be rescheduled.  Do not eat food OR drink liquids after midnight the night before surgery.  No gum chewing or hard candies.  One week prior to surgery:Last dose will be on 11-13-24 Tuesday Stop Anti-inflammatories (NSAIDS) such as Advil, Aleve, Ibuprofen, Motrin, Naproxen, Naprosyn and Aspirin  based products such as Excedrin, Goody's Powder, BC Powder. Stop ANY OVER THE COUNTER supplements until after surgery (Vitamin C , Vitamin D , Iron , Multivitamin)  You may however, continue to take Tylenol  if needed for pain up until the day of surgery.  Stop clopidogrel  (PLAVIX ) 5 days prior to surgery-Last dose will be on 11-15-24 Thursday  Stop dapagliflozin  propanediol (FARXIGA ) 3 days prior to surgery-Last dose will be on 11-17-24 Saturday  Stop metFORMIN  (GLUCOPHAGE ) 2 days prior to surgery-Last dose will be on 11-18-24 Sunday  Continue taking all of your other prescription medications up until the day of surgery.  ON THE DAY OF SURGERY ONLY TAKE THESE MEDICATIONS WITH SIPS OF WATER: -amLODipine  (NORVASC )  -sertraline  (ZOLOFT )   Continue your 81 mg Aspirin  up until the day prior to surgery-Do NOT take the day of surgery  No Alcohol for 24 hours before or after surgery.  No Smoking including e-cigarettes for 24 hours before surgery.  No chewable tobacco products for at least 6 hours before surgery.  No nicotine   patches on the day of surgery.  Do not use any recreational drugs for at least a week (preferably 2 weeks) before your surgery.  Please be advised that the combination of cocaine and anesthesia may have negative outcomes, up to and including death. If you test positive for cocaine, your surgery will be cancelled.  On the morning of surgery brush your teeth with toothpaste and water, you may rinse your mouth with mouthwash if you wish. Do not swallow any toothpaste or mouthwash.  Use CHG Soap as directed on instruction sheet.  Do not wear jewelry, make-up, hairpins, clips or nail polish.  For welded (permanent) jewelry: bracelets, anklets, waist bands, etc.  Please have this removed prior to surgery.  If it is not removed, there is a chance that hospital personnel will need to cut it off on the day of surgery.  Do not wear lotions, powders, or perfumes.   Do not shave body hair from the neck down 48 hours before surgery.  Contact lenses, hearing aids and dentures may not be worn into surgery.  Do not bring valuables to the hospital. [facility] is not responsible for any missing/lost belongings or valuables.   Notify your doctor if there is any change in your medical condition (cold, fever, infection).  Wear comfortable clothing (specific to your surgery type) to the hospital.  After surgery, you can help prevent lung complications by doing breathing exercises.  Take deep breaths and cough every 1-2 hours. Your doctor may order a device called an Incentive Spirometer to help you take deep breaths. When coughing or sneezing, hold  a pillow firmly against your incision with both hands. This is called "splinting." Doing this helps protect your incision. It also decreases belly discomfort.  If you are being admitted to the hospital overnight, leave your suitcase in the car. After surgery it may be brought to your room.  In case of increased patient census, it may be necessary for you,  the patient, to continue your postoperative care in the Same Day Surgery department.  If you are being discharged the day of surgery, you will not be allowed to drive home. You will need a responsible individual to drive you home and stay with you for 24 hours after surgery.   If you are taking public transportation, you will need to have a responsible individual with you.  Please call the Pre-admissions Testing Dept. at [phone] if you have any questions about these instructions.  Surgery Visitation Policy:  Patients having surgery or a procedure may have two visitors.  Children under the age of 55 must have an adult with them who is not the patient.  Inpatient Visitation:    Visiting hours are 7 a.m. to 8 p.m. Up to four visitors are allowed at one time in a patient room. The visitors may rotate out with other people during the day.  One visitor age 77 or older may stay with the patient overnight and must be in the room by 8 p.m.                                                                                                             Preparing for Surgery with CHLORHEXIDINE  GLUCONATE (CHG) Soap  Chlorhexidine  Gluconate (CHG) Soap  o An antiseptic cleaner that kills germs and bonds with the skin to continue killing germs even after washing  o Used for showering the night before surgery and morning of surgery  Before surgery, you can play an important role by reducing the number of germs on your skin.  CHG (Chlorhexidine  gluconate) soap is an antiseptic cleanser which kills germs and bonds with the skin to continue killing germs even after washing.  Please do not use if you have an allergy to CHG or antibacterial soaps. If your skin becomes reddened/irritated stop using the CHG.  1. Shower the NIGHT BEFORE SURGERY with CHG soap.  2. If you choose to wash your hair, wash your hair first as usual with your normal shampoo.  3. After shampooing, rinse your hair and body  thoroughly to remove the shampoo.  4. Use CHG as you would any other liquid soap. You can apply CHG directly to the skin and wash gently with a clean washcloth.  5. Apply the CHG soap to your body only from the neck down. Do not use on open wounds or open sores. Avoid contact with your eyes, ears, mouth, and genitals (private parts). Wash face and genitals (private parts) with your normal soap.  6. Wash thoroughly, paying special attention to the area where your surgery will be performed.  7. Thoroughly rinse your body with warm  water.  8. Do not shower/wash with your normal soap after using and rinsing off the CHG soap.  9. Do not use lotions, oils, etc., after showering with CHG.  10. Pat yourself dry with a clean towel.  11. Wear clean pajamas to bed the night before surgery.  12. Place clean sheets on your bed the night of your shower and do not sleep with pets.  13. Do not apply any deodorants/lotions/powders.  14. Please wear clean clothes to the hospital.  15. Remember to brush your teeth with your regular toothpaste.   [employer]ch[employer], Retail to address health-related social needs:  [email or web address]

## 2024-11-13 LAB — CYTOLOGY PLUS MONITORING PROFILE: PAP & FEULGEN

## 2024-11-14 ENCOUNTER — Inpatient Hospital Stay: Admission: RE | Admit: 2024-11-14 | Discharge: 2024-11-14 | Attending: Vascular Surgery | Admitting: Vascular Surgery

## 2024-11-14 ENCOUNTER — Encounter: Payer: Self-pay | Admitting: Vascular Surgery

## 2024-11-14 DIAGNOSIS — I70222 Atherosclerosis of native arteries of extremities with rest pain, left leg: Secondary | ICD-10-CM | POA: Insufficient documentation

## 2024-11-14 DIAGNOSIS — Z01818 Encounter for other preprocedural examination: Secondary | ICD-10-CM

## 2024-11-14 DIAGNOSIS — Z01812 Encounter for preprocedural laboratory examination: Secondary | ICD-10-CM | POA: Insufficient documentation

## 2024-11-14 LAB — TYPE AND SCREEN
ABO/RH(D): A POS
Antibody Screen: NEGATIVE

## 2024-11-14 LAB — CBC WITH DIFFERENTIAL/PLATELET
Abs Immature Granulocytes: 0.03 K/uL (ref 0.00–0.07)
Basophils Absolute: 0.1 K/uL (ref 0.0–0.1)
Basophils Relative: 1 %
Eosinophils Absolute: 0.2 K/uL (ref 0.0–0.5)
Eosinophils Relative: 2 %
HCT: 38.6 % — ABNORMAL LOW (ref 39.0–52.0)
Hemoglobin: 12.5 g/dL — ABNORMAL LOW (ref 13.0–17.0)
Immature Granulocytes: 0 %
Lymphocytes Relative: 33 %
Lymphs Abs: 3.5 K/uL (ref 0.7–4.0)
MCH: 29.8 pg (ref 26.0–34.0)
MCHC: 32.4 g/dL (ref 30.0–36.0)
MCV: 91.9 fL (ref 80.0–100.0)
Monocytes Absolute: 0.6 K/uL (ref 0.1–1.0)
Monocytes Relative: 6 %
Neutro Abs: 6.1 K/uL (ref 1.7–7.7)
Neutrophils Relative %: 58 %
Platelets: 250 K/uL (ref 150–400)
RBC: 4.2 MIL/uL — ABNORMAL LOW (ref 4.22–5.81)
RDW: 15.3 % (ref 11.5–15.5)
WBC: 10.5 K/uL (ref 4.0–10.5)
nRBC: 0 % (ref 0.0–0.2)

## 2024-11-14 LAB — SURGICAL PCR SCREEN
MRSA, PCR: NEGATIVE
Staphylococcus aureus: NEGATIVE

## 2024-11-20 MED ORDER — CHLORHEXIDINE GLUCONATE CLOTH 2 % EX PADS: 6.0000 | MEDICATED_PAD | Freq: Once | CUTANEOUS | Status: DC

## 2024-11-20 MED ORDER — CHLORHEXIDINE GLUCONATE CLOTH 2 % EX PADS
6.0000 | MEDICATED_PAD | Freq: Once | CUTANEOUS | Status: AC
  Administered 2024-11-21: 10:00:00 6 via TOPICAL

## 2024-11-20 MED ORDER — CEFAZOLIN SODIUM-DEXTROSE 2-4 GM/100ML-% IV SOLN
2.0000 g | INTRAVENOUS | Status: AC
  Administered 2024-11-21: 11:00:00 2 g via INTRAVENOUS

## 2024-11-20 MED ORDER — CHLORHEXIDINE GLUCONATE 0.12 % MT SOLN
15.0000 mL | Freq: Once | OROMUCOSAL | Status: AC
  Administered 2024-11-21: 10:00:00 15 mL via OROMUCOSAL

## 2024-11-20 MED ORDER — SODIUM CHLORIDE 0.9 % IV SOLN: INTRAVENOUS | Status: DC

## 2024-11-20 MED ORDER — ORAL CARE MOUTH RINSE: 15.0000 mL | Freq: Once | OROMUCOSAL | Status: AC

## 2024-11-21 ENCOUNTER — Inpatient Hospital Stay
Admission: RE | Admit: 2024-11-21 | Discharge: 2024-11-27 | DRG: 272 | Disposition: A | Discharge status: Skilled Nursing Facility | Attending: Vascular Surgery | Admitting: Vascular Surgery

## 2024-11-21 ENCOUNTER — Encounter: Admitting: Urgent Care

## 2024-11-21 ENCOUNTER — Encounter
Admission: RE | Disposition: A | Discharge status: Skilled Nursing Facility | Payer: Self-pay | Attending: Vascular Surgery

## 2024-11-21 ENCOUNTER — Other Ambulatory Visit: Payer: Self-pay

## 2024-11-21 ENCOUNTER — Encounter: Payer: Self-pay | Admitting: Vascular Surgery

## 2024-11-21 ENCOUNTER — Inpatient Hospital Stay: Admitting: Urgent Care

## 2024-11-21 DIAGNOSIS — E1151 Type 2 diabetes mellitus with diabetic peripheral angiopathy without gangrene: Secondary | ICD-10-CM | POA: Diagnosis present

## 2024-11-21 DIAGNOSIS — I70221 Atherosclerosis of native arteries of extremities with rest pain, right leg: Secondary | ICD-10-CM | POA: Diagnosis present

## 2024-11-21 DIAGNOSIS — Z01818 Encounter for other preprocedural examination: Secondary | ICD-10-CM

## 2024-11-21 DIAGNOSIS — N182 Chronic kidney disease, stage 2 (mild): Secondary | ICD-10-CM | POA: Diagnosis present

## 2024-11-21 DIAGNOSIS — I745 Embolism and thrombosis of iliac artery: Secondary | ICD-10-CM

## 2024-11-21 DIAGNOSIS — D631 Anemia in chronic kidney disease: Secondary | ICD-10-CM | POA: Diagnosis present

## 2024-11-21 DIAGNOSIS — H902 Conductive hearing loss, unspecified: Secondary | ICD-10-CM | POA: Diagnosis present

## 2024-11-21 DIAGNOSIS — Z95820 Peripheral vascular angioplasty status with implants and grafts: Secondary | ICD-10-CM | POA: Diagnosis not present

## 2024-11-21 DIAGNOSIS — I70229 Atherosclerosis of native arteries of extremities with rest pain, unspecified extremity: Principal | ICD-10-CM | POA: Diagnosis present

## 2024-11-21 DIAGNOSIS — I10 Essential (primary) hypertension: Secondary | ICD-10-CM | POA: Diagnosis not present

## 2024-11-21 DIAGNOSIS — Z8249 Family history of ischemic heart disease and other diseases of the circulatory system: Secondary | ICD-10-CM | POA: Diagnosis not present

## 2024-11-21 DIAGNOSIS — F32A Depression, unspecified: Secondary | ICD-10-CM | POA: Diagnosis present

## 2024-11-21 DIAGNOSIS — I701 Atherosclerosis of renal artery: Secondary | ICD-10-CM | POA: Diagnosis present

## 2024-11-21 DIAGNOSIS — Z79899 Other long term (current) drug therapy: Secondary | ICD-10-CM | POA: Diagnosis not present

## 2024-11-21 DIAGNOSIS — Z8673 Personal history of transient ischemic attack (TIA), and cerebral infarction without residual deficits: Secondary | ICD-10-CM | POA: Diagnosis not present

## 2024-11-21 DIAGNOSIS — Z833 Family history of diabetes mellitus: Secondary | ICD-10-CM | POA: Diagnosis not present

## 2024-11-21 DIAGNOSIS — E1122 Type 2 diabetes mellitus with diabetic chronic kidney disease: Secondary | ICD-10-CM | POA: Diagnosis present

## 2024-11-21 DIAGNOSIS — I2510 Atherosclerotic heart disease of native coronary artery without angina pectoris: Secondary | ICD-10-CM | POA: Diagnosis present

## 2024-11-21 DIAGNOSIS — Z823 Family history of stroke: Secondary | ICD-10-CM | POA: Diagnosis not present

## 2024-11-21 DIAGNOSIS — Z7902 Long term (current) use of antithrombotics/antiplatelets: Secondary | ICD-10-CM | POA: Diagnosis not present

## 2024-11-21 DIAGNOSIS — Z85828 Personal history of other malignant neoplasm of skin: Secondary | ICD-10-CM

## 2024-11-21 DIAGNOSIS — F17210 Nicotine dependence, cigarettes, uncomplicated: Secondary | ICD-10-CM | POA: Diagnosis present

## 2024-11-21 DIAGNOSIS — F3342 Major depressive disorder, recurrent, in full remission: Secondary | ICD-10-CM

## 2024-11-21 DIAGNOSIS — E785 Hyperlipidemia, unspecified: Secondary | ICD-10-CM | POA: Diagnosis present

## 2024-11-21 DIAGNOSIS — Z9889 Other specified postprocedural states: Secondary | ICD-10-CM | POA: Diagnosis not present

## 2024-11-21 DIAGNOSIS — Z89511 Acquired absence of right leg below knee: Secondary | ICD-10-CM | POA: Diagnosis not present

## 2024-11-21 DIAGNOSIS — I129 Hypertensive chronic kidney disease with stage 1 through stage 4 chronic kidney disease, or unspecified chronic kidney disease: Secondary | ICD-10-CM | POA: Diagnosis present

## 2024-11-21 DIAGNOSIS — Z7982 Long term (current) use of aspirin: Secondary | ICD-10-CM

## 2024-11-21 DIAGNOSIS — Z7984 Long term (current) use of oral hypoglycemic drugs: Secondary | ICD-10-CM

## 2024-11-21 DIAGNOSIS — E782 Mixed hyperlipidemia: Secondary | ICD-10-CM

## 2024-11-21 HISTORY — PX: THROMBECTOMY FEMORAL ARTERY: SHX6406

## 2024-11-21 HISTORY — DX: Atherosclerosis of aorta: I70.0

## 2024-11-21 HISTORY — DX: Long term (current) use of antithrombotics/antiplatelets: Z79.02

## 2024-11-21 HISTORY — DX: Diverticulosis of intestine, part unspecified, without perforation or abscess without bleeding: K57.90

## 2024-11-21 HISTORY — DX: Other nonspecific abnormal finding of lung field: R91.8

## 2024-11-21 HISTORY — PX: ENDARTERECTOMY FEMORAL: SHX5804

## 2024-11-21 HISTORY — PX: APPLICATION OF CELL SAVER: SHX7529

## 2024-11-21 HISTORY — DX: Atherosclerotic heart disease of native coronary artery without angina pectoris: I25.10

## 2024-11-21 HISTORY — DX: Long term (current) use of aspirin: Z79.82

## 2024-11-21 HISTORY — DX: Infrarenal abdominal aortic aneurysm, without rupture: I71.43

## 2024-11-21 HISTORY — DX: Calculus of gallbladder without cholecystitis without obstruction: K80.20

## 2024-11-21 LAB — CBC
HCT: 30.9 % — ABNORMAL LOW (ref 39.0–52.0)
Hemoglobin: 10.1 g/dL — ABNORMAL LOW (ref 13.0–17.0)
MCH: 29.5 pg (ref 26.0–34.0)
MCHC: 32.7 g/dL (ref 30.0–36.0)
MCV: 90.4 fL (ref 80.0–100.0)
Platelets: 138 K/uL — ABNORMAL LOW (ref 150–400)
RBC: 3.42 MIL/uL — ABNORMAL LOW (ref 4.22–5.81)
RDW: 15.3 % (ref 11.5–15.5)
WBC: 6.7 K/uL (ref 4.0–10.5)
nRBC: 0 % (ref 0.0–0.2)

## 2024-11-21 LAB — GLUCOSE, CAPILLARY
Glucose-Capillary: 118 mg/dL — ABNORMAL HIGH (ref 70–99)
Glucose-Capillary: 160 mg/dL — ABNORMAL HIGH (ref 70–99)
Glucose-Capillary: 167 mg/dL — ABNORMAL HIGH (ref 70–99)
Glucose-Capillary: 178 mg/dL — ABNORMAL HIGH (ref 70–99)

## 2024-11-21 LAB — CREATININE, SERUM
Creatinine, Ser: 0.85 mg/dL (ref 0.61–1.24)
GFR, Estimated: >60 mL/min (ref >60)

## 2024-11-21 SURGERY — ENDARTERECTOMY, FEMORAL
Anesthesia: General | Laterality: Right

## 2024-11-21 MED ORDER — HYDROMORPHONE HCL 1 MG/ML IJ SOLN: 1.0000 mg | Freq: Once | INTRAMUSCULAR | Status: DC | PRN

## 2024-11-21 MED ORDER — SORBITOL 70 % SOLN: 30.0000 mL | Freq: Every day | Status: DC | PRN

## 2024-11-21 MED ORDER — GENTAMICIN SULFATE 40 MG/ML IJ SOLN
INTRAMUSCULAR | Status: AC
  Filled 2024-11-21: qty 4

## 2024-11-21 MED ORDER — ROCURONIUM BROMIDE 100 MG/10ML IV SOLN
INTRAVENOUS | Status: DC | PRN
  Administered 2024-11-21 (×2): 20 mg via INTRAVENOUS
  Administered 2024-11-21: 13:00:00 10 mg via INTRAVENOUS
  Administered 2024-11-21: 11:00:00 50 mg via INTRAVENOUS

## 2024-11-21 MED ORDER — LABETALOL HCL 5 MG/ML IV SOLN: 10.0000 mg | INTRAVENOUS | Status: DC | PRN

## 2024-11-21 MED ORDER — ONDANSETRON HCL 4 MG/2ML IJ SOLN
INTRAMUSCULAR | Status: DC | PRN
  Administered 2024-11-21: 11:00:00 4 mg via INTRAVENOUS

## 2024-11-21 MED ORDER — HYDRALAZINE HCL 20 MG/ML IJ SOLN
INTRAMUSCULAR | Status: AC
  Filled 2024-11-21: qty 1

## 2024-11-21 MED ORDER — LACTATED RINGERS IV SOLN: INTRAVENOUS | Status: DC | PRN

## 2024-11-21 MED ORDER — AMLODIPINE BESYLATE 5 MG PO TABS
5.0000 mg | ORAL_TABLET | ORAL | Status: DC
  Administered 2024-11-22 – 2024-11-27 (×6): 5 mg via ORAL
  Filled 2024-11-21 (×6): qty 1

## 2024-11-21 MED ORDER — HYDROMORPHONE HCL 1 MG/ML IJ SOLN
INTRAMUSCULAR | Status: DC | PRN
  Administered 2024-11-21: 11:00:00 1 mg via INTRAVENOUS

## 2024-11-21 MED ORDER — FENTANYL CITRATE (PF) 100 MCG/2ML IJ SOLN
INTRAMUSCULAR | Status: AC
  Filled 2024-11-21: qty 2

## 2024-11-21 MED ORDER — LABETALOL HCL 5 MG/ML IV SOLN
INTRAVENOUS | Status: AC
  Filled 2024-11-21: qty 4

## 2024-11-21 MED ORDER — OXYCODONE HCL 5 MG PO TABS: 5.0000 mg | ORAL_TABLET | Freq: Once | ORAL | Status: DC | PRN

## 2024-11-21 MED ORDER — PHENOL 1.4 % MT LIQD: 1.0000 | OROMUCOSAL | Status: DC | PRN

## 2024-11-21 MED ORDER — ROCURONIUM BROMIDE 10 MG/ML (PF) SYRINGE
PREFILLED_SYRINGE | INTRAVENOUS | Status: AC
  Filled 2024-11-21: qty 10

## 2024-11-21 MED ORDER — SUGAMMADEX SODIUM 200 MG/2ML IV SOLN
INTRAVENOUS | Status: DC | PRN
  Administered 2024-11-21: 14:00:00 100 mg via INTRAVENOUS
  Administered 2024-11-21: 14:00:00 200 mg via INTRAVENOUS

## 2024-11-21 MED ORDER — ICOSAPENT ETHYL 1 G PO CAPS
2.0000 g | ORAL_CAPSULE | Freq: Two times a day (BID) | ORAL | Status: DC
  Administered 2024-11-21 – 2024-11-27 (×12): 2 g via ORAL
  Filled 2024-11-21 (×14): qty 2

## 2024-11-21 MED ORDER — LIDOCAINE HCL (CARDIAC) PF 100 MG/5ML IV SOSY
PREFILLED_SYRINGE | INTRAVENOUS | Status: DC | PRN
  Administered 2024-11-21: 11:00:00 80 mg via INTRAVENOUS

## 2024-11-21 MED ORDER — ALBUMIN HUMAN 5 % IV SOLN
INTRAVENOUS | Status: AC
  Filled 2024-11-21: qty 500

## 2024-11-21 MED ORDER — CLOPIDOGREL BISULFATE 75 MG PO TABS
75.0000 mg | ORAL_TABLET | Freq: Every day | ORAL | Status: DC
  Administered 2024-11-22 – 2024-11-27 (×6): 75 mg via ORAL
  Filled 2024-11-21 (×6): qty 1

## 2024-11-21 MED ORDER — VASHE WOUND IRRIGATION OPTIME
TOPICAL | Status: DC | PRN
  Administered 2024-11-21: 14:00:00 34 [oz_av] via TOPICAL

## 2024-11-21 MED ORDER — ENOXAPARIN SODIUM 40 MG/0.4ML IJ SOSY
40.0000 mg | PREFILLED_SYRINGE | INTRAMUSCULAR | Status: DC
  Administered 2024-11-22 – 2024-11-27 (×6): 40 mg via SUBCUTANEOUS
  Filled 2024-11-21 (×6): qty 0.4

## 2024-11-21 MED ORDER — SENNOSIDES-DOCUSATE SODIUM 8.6-50 MG PO TABS: 1.0000 | ORAL_TABLET | Freq: Every evening | ORAL | Status: DC | PRN

## 2024-11-21 MED ORDER — OXYCODONE HCL 5 MG PO TABS
5.0000 mg | ORAL_TABLET | ORAL | Status: DC | PRN
  Administered 2024-11-21 – 2024-11-25 (×6): 10 mg via ORAL
  Filled 2024-11-21 (×6): qty 2

## 2024-11-21 MED ORDER — IRBESARTAN 150 MG PO TABS
150.0000 mg | ORAL_TABLET | Freq: Every day | ORAL | Status: DC
  Administered 2024-11-21 – 2024-11-27 (×7): 150 mg via ORAL
  Filled 2024-11-21 (×7): qty 1

## 2024-11-21 MED ORDER — OXYCODONE HCL 5 MG/5ML PO SOLN: 5.0000 mg | Freq: Once | ORAL | Status: DC | PRN

## 2024-11-21 MED ORDER — ESMOLOL HCL 100 MG/10ML IV SOLN
INTRAVENOUS | Status: DC | PRN
  Administered 2024-11-21: 14:00:00 30 mg via INTRAVENOUS
  Administered 2024-11-21: 14:00:00 40 mg via INTRAVENOUS
  Administered 2024-11-21: 14:00:00 30 mg via INTRAVENOUS

## 2024-11-21 MED ORDER — ACETAMINOPHEN 325 MG PO TABS: 325.0000 mg | ORAL_TABLET | ORAL | Status: DC | PRN

## 2024-11-21 MED ORDER — ROSUVASTATIN CALCIUM 10 MG PO TABS
40.0000 mg | ORAL_TABLET | Freq: Every day | ORAL | Status: DC
  Administered 2024-11-21 – 2024-11-27 (×7): 40 mg via ORAL
  Filled 2024-11-21 (×7): qty 4

## 2024-11-21 MED ORDER — ASPIRIN 81 MG PO TBEC
81.0000 mg | DELAYED_RELEASE_TABLET | Freq: Every day | ORAL | Status: DC
  Administered 2024-11-21 – 2024-11-27 (×7): 81 mg via ORAL
  Filled 2024-11-21 (×7): qty 1

## 2024-11-21 MED ORDER — STERILE WATER FOR IRRIGATION IR SOLN
Status: DC | PRN
  Administered 2024-11-21: 12:00:00 1000 mL

## 2024-11-21 MED ORDER — NITROGLYCERIN IN D5W 200-5 MCG/ML-% IV SOLN: 5.0000 ug/min | INTRAVENOUS | Status: DC

## 2024-11-21 MED ORDER — SERTRALINE HCL 50 MG PO TABS
100.0000 mg | ORAL_TABLET | ORAL | Status: DC
  Administered 2024-11-22 – 2024-11-27 (×6): 100 mg via ORAL
  Filled 2024-11-21 (×6): qty 2

## 2024-11-21 MED ORDER — ONDANSETRON HCL 4 MG/2ML IJ SOLN: 4.0000 mg | Freq: Four times a day (QID) | INTRAMUSCULAR | Status: DC | PRN

## 2024-11-21 MED ORDER — HEPARIN 30,000 UNITS/1000 ML (OHS) CELLSAVER SOLUTION
Status: AC | PRN
  Administered 2024-11-21: 12:00:00 1

## 2024-11-21 MED ORDER — MIDAZOLAM HCL (PF) 2 MG/2ML IJ SOLN
INTRAMUSCULAR | Status: DC | PRN
  Administered 2024-11-21: 11:00:00 2 mg via INTRAVENOUS

## 2024-11-21 MED ORDER — GLYCOPYRROLATE 0.2 MG/ML IJ SOLN
INTRAMUSCULAR | Status: DC | PRN
  Administered 2024-11-21: 11:00:00 .2 mg via INTRAVENOUS

## 2024-11-21 MED ORDER — SODIUM CHLORIDE 0.9 % IV SOLN: INTRAVENOUS | Status: AC

## 2024-11-21 MED ORDER — ONDANSETRON HCL 4 MG/2ML IJ SOLN
4.0000 mg | Freq: Four times a day (QID) | INTRAMUSCULAR | Status: DC | PRN
  Administered 2024-11-23: 4 mg via INTRAVENOUS
  Filled 2024-11-21: qty 2

## 2024-11-21 MED ORDER — CEFAZOLIN SODIUM-DEXTROSE 2-4 GM/100ML-% IV SOLN
2.0000 g | Freq: Three times a day (TID) | INTRAVENOUS | Status: AC
  Administered 2024-11-21 – 2024-11-22 (×2): 2 g via INTRAVENOUS
  Filled 2024-11-21 (×2): qty 100

## 2024-11-21 MED ORDER — CHLORHEXIDINE GLUCONATE 0.12 % MT SOLN
OROMUCOSAL | Status: AC
  Filled 2024-11-21: qty 15

## 2024-11-21 MED ORDER — ACETAMINOPHEN 10 MG/ML IV SOLN
INTRAVENOUS | Status: AC
  Filled 2024-11-21: qty 100

## 2024-11-21 MED ORDER — HYDROMORPHONE HCL 1 MG/ML IJ SOLN
INTRAMUSCULAR | Status: AC
  Filled 2024-11-21: qty 1

## 2024-11-21 MED ORDER — MORPHINE SULFATE (PF) 2 MG/ML IV SOLN
2.0000 mg | INTRAVENOUS | Status: DC | PRN
  Administered 2024-11-21 – 2024-11-24 (×4): 4 mg via INTRAVENOUS
  Filled 2024-11-21 (×2): qty 2
  Filled 2024-11-21: qty 3
  Filled 2024-11-21: qty 2

## 2024-11-21 MED ORDER — POTASSIUM CHLORIDE CRYS ER 20 MEQ PO TBCR: 40.0000 meq | EXTENDED_RELEASE_TABLET | Freq: Every day | ORAL | Status: DC | PRN

## 2024-11-21 MED ORDER — ACETAMINOPHEN 650 MG RE SUPP: 325.0000 mg | RECTAL | Status: DC | PRN

## 2024-11-21 MED ORDER — ALBUMIN HUMAN 5 % IV SOLN: INTRAVENOUS | Status: DC | PRN

## 2024-11-21 MED ORDER — PHENYLEPHRINE HCL-NACL 20-0.9 MG/250ML-% IV SOLN
INTRAVENOUS | Status: DC | PRN
  Administered 2024-11-21: 11:00:00 30 ug/min via INTRAVENOUS

## 2024-11-21 MED ORDER — ALBUMIN HUMAN 5 % IV SOLN
INTRAVENOUS | Status: AC
  Filled 2024-11-21: qty 750

## 2024-11-21 MED ORDER — DEXAMETHASONE SOD PHOSPHATE PF 10 MG/ML IJ SOLN
INTRAMUSCULAR | Status: DC | PRN
  Administered 2024-11-21: 11:00:00 10 mg via INTRAVENOUS

## 2024-11-21 MED ORDER — ALBUMIN HUMAN 5 % IV SOLN
25.0000 g | Freq: Once | INTRAVENOUS | Status: DC
  Filled 2024-11-21: qty 500

## 2024-11-21 MED ORDER — HEPARIN SODIUM (PORCINE) 5000 UNIT/ML IJ SOLN
INTRAMUSCULAR | Status: DC | PRN
  Administered 2024-11-21: 12:00:00 501 mL via SURGICAL_CAVITY

## 2024-11-21 MED ORDER — FENTANYL CITRATE (PF) 100 MCG/2ML IJ SOLN: 25.0000 ug | INTRAMUSCULAR | Status: DC | PRN

## 2024-11-21 MED ORDER — VANCOMYCIN HCL 1000 MG IV SOLR
INTRAVENOUS | Status: AC
  Filled 2024-11-21: qty 20

## 2024-11-21 MED ORDER — PNEUMOCOCCAL 20-VAL CONJ VACC 0.5 ML IM SUSY: 0.5000 mL | PREFILLED_SYRINGE | INTRAMUSCULAR | Status: DC

## 2024-11-21 MED ORDER — CEFAZOLIN SODIUM-DEXTROSE 2-4 GM/100ML-% IV SOLN
INTRAVENOUS | Status: AC
  Filled 2024-11-21: qty 100

## 2024-11-21 MED ORDER — PROPOFOL 10 MG/ML IV BOLUS
INTRAVENOUS | Status: DC | PRN
  Administered 2024-11-21: 11:00:00 140 mg via INTRAVENOUS

## 2024-11-21 MED ORDER — DOCUSATE SODIUM 100 MG PO CAPS
100.0000 mg | ORAL_CAPSULE | Freq: Every day | ORAL | Status: DC
  Administered 2024-11-22 – 2024-11-27 (×6): 100 mg via ORAL
  Filled 2024-11-21 (×6): qty 1

## 2024-11-21 MED ORDER — SODIUM CHLORIDE 0.9 % IV SOLN: 500.0000 mL | Freq: Once | INTRAVENOUS | Status: DC | PRN

## 2024-11-21 MED ORDER — HEPARIN 30,000 UNITS/1000 ML (OHS) CELLSAVER SOLUTION
Status: AC
  Filled 2024-11-21: qty 1000

## 2024-11-21 MED ORDER — METOPROLOL TARTRATE 5 MG/5ML IV SOLN: 2.5000 mg | INTRAVENOUS | Status: DC | PRN

## 2024-11-21 MED ORDER — HYDRALAZINE HCL 20 MG/ML IJ SOLN: 5.0000 mg | INTRAMUSCULAR | Status: DC | PRN

## 2024-11-21 MED ORDER — HEPARIN SODIUM (PORCINE) 1000 UNIT/ML IJ SOLN
INTRAMUSCULAR | Status: DC | PRN
  Administered 2024-11-21: 13:00:00 2000 [IU] via INTRAVENOUS
  Administered 2024-11-21: 12:00:00 6000 [IU] via INTRAVENOUS

## 2024-11-21 MED ORDER — INSULIN ASPART 100 UNIT/ML IJ SOLN: 0.0000 [IU] | Freq: Every day | INTRAMUSCULAR | Status: DC

## 2024-11-21 MED ORDER — INSULIN ASPART 100 UNIT/ML IJ SOLN
0.0000 [IU] | Freq: Three times a day (TID) | INTRAMUSCULAR | Status: DC
  Administered 2024-11-21: 18:00:00 3 [IU] via SUBCUTANEOUS
  Administered 2024-11-22: 10:00:00 2 [IU] via SUBCUTANEOUS
  Administered 2024-11-22: 17:00:00 3 [IU] via SUBCUTANEOUS
  Administered 2024-11-25: 2 [IU] via SUBCUTANEOUS
  Administered 2024-11-25 – 2024-11-26 (×3): 3 [IU] via SUBCUTANEOUS
  Administered 2024-11-26 – 2024-11-27 (×3): 2 [IU] via SUBCUTANEOUS
  Administered 2024-11-27: 3 [IU] via SUBCUTANEOUS
  Filled 2024-11-21 (×3): qty 3
  Filled 2024-11-21 (×4): qty 2
  Filled 2024-11-21: qty 3
  Filled 2024-11-21: qty 2
  Filled 2024-11-21: qty 5
  Filled 2024-11-21: qty 3

## 2024-11-21 MED ORDER — ACETAMINOPHEN 10 MG/ML IV SOLN
INTRAVENOUS | Status: DC | PRN
  Administered 2024-11-21: 11:00:00 1000 mg via INTRAVENOUS

## 2024-11-21 MED ORDER — MIDAZOLAM HCL 2 MG/2ML IJ SOLN
INTRAMUSCULAR | Status: AC
  Filled 2024-11-21: qty 2

## 2024-11-21 MED ORDER — HEPARIN SODIUM (PORCINE) 5000 UNIT/ML IJ SOLN
INTRAMUSCULAR | Status: AC
  Filled 2024-11-21: qty 1

## 2024-11-21 MED ORDER — LOSARTAN POTASSIUM 50 MG PO TABS: 50.0000 mg | ORAL_TABLET | ORAL | Status: DC

## 2024-11-21 MED ADMIN — Vancomycin HCl For IV Soln 1 GM (Base Equivalent): 1000 mg | @ 14:00:00 | NDC 67457034001

## 2024-11-21 MED ADMIN — HEMOSTATIC AGENTS (NO CHARGE) OPTIME: 1 | TOPICAL | @ 14:00:00 | NDC 99999080054

## 2024-11-21 MED ADMIN — Fentanyl Citrate Preservative Free (PF) Inj 100 MCG/2ML: 50 ug | INTRAVENOUS | @ 11:00:00 | NDC 72572017001

## 2024-11-21 MED ADMIN — Fentanyl Citrate Preservative Free (PF) Inj 100 MCG/2ML: 50 ug | INTRAVENOUS | @ 12:00:00 | NDC 72572017001

## 2024-11-21 MED ADMIN — henylephrine-NaCl Pref Syr 0.8 MG/10ML-0.9% (80 MCG/ML): 160 ug | INTRAVENOUS | @ 11:00:00 | NDC 65302050510

## 2024-11-21 MED ADMIN — Gentamicin Sulfate Inj 40 MG/ML: 160 mg | INTRAMUSCULAR | @ 14:00:00 | NDC 00409120703

## 2024-11-21 MED ADMIN — Ephedrine Sulf-NaCl Soln Pref Syr 50 MG/10ML-0.9% (5 MG/ML): 5 mg | INTRAVENOUS | @ 12:00:00 | NDC 99999070054

## 2024-11-21 MED ADMIN — henylephrine-NaCl Pref Syr 0.8 MG/10ML-0.9% (80 MCG/ML): 80 ug | INTRAVENOUS | @ 12:00:00 | NDC 65302050510

## 2024-11-21 MED ADMIN — henylephrine-NaCl Pref Syr 0.8 MG/10ML-0.9% (80 MCG/ML): 80 ug | INTRAVENOUS | @ 11:00:00 | NDC 65302050510

## 2024-11-21 MED FILL — Succinylcholine Chloride Sol Pref Syr 200 MG/10ML (20 MG/ML): INTRAVENOUS | Qty: 10 | Status: AC

## 2024-11-21 MED FILL — Ondansetron HCl Inj 4 MG/2ML (2 MG/ML): INTRAMUSCULAR | Qty: 2 | Status: AC

## 2024-11-21 MED FILL — Lidocaine HCl Local Preservative Free (PF) Inj 2%: INTRAMUSCULAR | Qty: 5 | Status: AC

## 2024-11-21 SURGICAL SUPPLY — 47 items
BAG DECANTER FOR FLEXI CONT (MISCELLANEOUS) ×2 IMPLANT
BLADE SURG SZ11 CARB STEEL (BLADE) ×2 IMPLANT
BRUSH SCRUB EZ 4% CHG (MISCELLANEOUS) ×2 IMPLANT
CATH EMB LF 4FRX80 (CATHETERS) IMPLANT
CHLORAPREP W/TINT 26 (MISCELLANEOUS) ×2 IMPLANT
CLAMP SUTURE YELLOW 5 PAIRS (MISCELLANEOUS) ×2 IMPLANT
CLEANSER WND VASHE 34 (WOUND CARE) IMPLANT
DRAPE INCISE IOBAN 66X45 STRL (DRAPES) ×2 IMPLANT
DRAPE SHEET LG 3/4 BI-LAMINATE (DRAPES) ×2 IMPLANT
DRESSING SURGICEL FIBRLLR 1X2 (HEMOSTASIS) ×2 IMPLANT
DRSG OPSITE POSTOP 4X8 (GAUZE/BANDAGES/DRESSINGS) IMPLANT
ELECTRODE REM PT RTRN 9FT ADLT (ELECTROSURGICAL) ×2 IMPLANT
GLOVE BIO SURGEON STRL SZ7 (GLOVE) ×4 IMPLANT
GLOVE SURG SYN 8.0 PF PI (GLOVE) ×2 IMPLANT
GOWN STRL REUS W/ TWL LRG LVL3 (GOWN DISPOSABLE) ×2 IMPLANT
GOWN STRL REUS W/ TWL XL LVL3 (GOWN DISPOSABLE) ×2 IMPLANT
GOWN STRL REUS W/TWL 2XL LVL3 (GOWN DISPOSABLE) ×2 IMPLANT
GRADUATE 1200CC STRL 31836 (MISCELLANEOUS) IMPLANT
GRAFT VASC PATCH XENOSURE 1X14 (Vascular Products) IMPLANT
HEMOSTAT HEMOBLAST BELLOWS (HEMOSTASIS) IMPLANT
IV 0.9% NACL 500 ML (IV SOLUTION) ×2 IMPLANT
KIT STIMULAN RAPID CURE 5CC (Orthopedic Implant) IMPLANT
KIT TURNOVER KIT A (KITS) ×2 IMPLANT
LABEL OR SOLS (LABEL) ×2 IMPLANT
LOOP VESSEL MAXI 1X406 RED (MISCELLANEOUS) ×4 IMPLANT
LOOP VESSEL MINI 0.8X406 BLUE (MISCELLANEOUS) ×6 IMPLANT
MANIFOLD NEPTUNE II (INSTRUMENTS) ×2 IMPLANT
NDL HYPO 18GX1.5 BLUNT FILL (NEEDLE) ×2 IMPLANT
NEEDLE HYPO 18GX1.5 BLUNT FILL (NEEDLE) ×2 IMPLANT
NS IRRIG 500ML POUR BTL (IV SOLUTION) ×2 IMPLANT
PACK BASIN MAJOR ARMC (MISCELLANEOUS) ×2 IMPLANT
PACK UNIVERSAL (MISCELLANEOUS) ×2 IMPLANT
PENCIL SMOKE EVACUATOR (MISCELLANEOUS) IMPLANT
SET WALTER ACTIVATION W/DRAPE (SET/KITS/TRAYS/PACK) ×2 IMPLANT
SPIKE FLUID TRANSFER (MISCELLANEOUS) ×2 IMPLANT
SPONGE T-LAP 18X18 ~~LOC~~+RFID (SPONGE) IMPLANT
STAPLER SKIN PROX 35W (STAPLE) ×2 IMPLANT
SUT PROLENE 5 0 RB 1 DA (SUTURE) ×8 IMPLANT
SUT PROLENE 6 0 BV (SUTURE) ×12 IMPLANT
SUT PROLENE 7 0 BV 1 (SUTURE) ×8 IMPLANT
SUT SILK 3-0 18XBRD TIE 12 (SUTURE) ×2 IMPLANT
SUT VIC AB 2-0 CT1 TAPERPNT 27 (SUTURE) ×4 IMPLANT
SUT VIC AB 3-0 SH 27X BRD (SUTURE) ×2 IMPLANT
SUT VICRYL+ 3-0 36IN CT-1 (SUTURE) ×4 IMPLANT
SYR 5ML LL (SYRINGE) ×2 IMPLANT
TRAP FLUID SMOKE EVACUATOR (MISCELLANEOUS) ×2 IMPLANT
TRAY FOLEY MTR SLVR 16FR STAT (SET/KITS/TRAYS/PACK) ×2 IMPLANT

## 2024-11-21 NOTE — H&P (View-Only) (Signed)
 MRN : [id number]  Jordan Arellano is a 73 y.o. (09/16/1951) male who presents with chief complaint of check circulation.  History of Present Illness:  Patient patient presents to [facility] today for treatment of his atherosclerotic occlusive disease right lower extremity.  He was last seen in the office September 27, 2024.  He is status post angiogram with intervention on 09/25/2024.    Procedure: Diagnostic angiogram which showed:  The patient will require a left femoral to tibial bypass it appears to be the posterior tibial but on table angiography should be performed at the time of surgery given the anomalous distal outflow.  There is documentation that he has had previous vein mapping which was not adequate for bypass in which case a distal flow graft would be the likely choice.   On the right the patient should undergo endarterectomy of the common femoral and profunda femoris given the extensive nature of the disease if not treated he would likely progress to an above-knee amputation.   Given the redo and extensive nature of both of these situations they would be done separately.   The patient notes he is having significant rest pain symptoms right side is worse than the left. No new ulcers or wounds have occurred since the last visit.   There have been no significant changes to the patient's overall health care.   No documented history of amaurosis fugax or recent TIA symptoms. There are no recent neurological changes noted. No documented history of DVT, PE or superficial thrombophlebitis. The patient denies recent episodes of angina or shortness of breath  Active Medications[1]  Past Medical History:  Diagnosis Date   Acute hematogenous osteomyelitis of left foot (HCC) 05/19/2023   Anemia of chronic disease    Aortic atherosclerosis    Atherosclerosis of native arteries of extremity with  rest pain (HCC)    Basal cell carcinoma 08/11/2022   R forearm - ED&C   Bifascicular block    Bilateral nephrolithiasis    Calculus of gallbladder without cholecystitis    Cessation of tobacco use in previous 12 months 09/01/2023   Cholelithiasis    CKD (chronic kidney disease) stage 2, GFR 60-89 ml/min    Conductive hearing loss in right ear    Coronary artery calcification seen on CT scan    Critical limb ischemia of right lower extremity (HCC)    Depression    Diverticulosis    DM (diabetes mellitus), type 2 (HCC)    Foot ulcer (HCC) 12/12/2016   Hyperlipidemia    Hypertension    Infrarenal abdominal aortic aneurysm (AAA) without rupture    Left renal artery stenosis    Long term current use of clopidogrel     Long-term use of aspirin  therapy    Mild cognitive impairment    Multiple pulmonary nodules    PAD (peripheral artery disease) ~2007   s/p R BKA for non-healing wound   PONV (postoperative nausea and vomiting)    after Oct 2025 LE intervention   Protein-calorie malnutrition, severe 05/24/2023   Status post below-knee amputation of right lower extremity (HCC)  Status post femoral-popliteal bypass surgery    Stroke (HCC) 03/2014   MRI: Acute nonhemorrhagic left paracentral pontine infarct. 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   TIA (transient ischemic attack) 01/2014   Vitamin D  deficiency     Past Surgical History:  Procedure Laterality Date   AMPUTATION TOE Left 05/21/2023   Procedure: AMPUTATION LEFT 1st & 3rd TOES;  Surgeon: Neill Boas, DPM;  Location: [facility] ORS;  Service: Podiatry;  Laterality: Left;   BELOW KNEE LEG AMPUTATION Right    FEMORAL-POPLITEAL BYPASS GRAFT Left 12/17/2016   Procedure: BYPASS LEFT FEMORAL TO BELOW POPLITEAL ARTERY USING PROPATEN GORE GRAFT;  Surgeon: Boas JULIANNA Doing, MD;  Location: [facility] OR;  Service: Vascular;  Laterality: Left;   FEMORAL-POPLITEAL BYPASS GRAFT  Left 09/30/2017   Procedure: LEFT LEG ANGIOGRAM,  THROMBECTOMY, FEM-POPLITEAL BYPASS GRAFT, tHROMBECTOMY PERONEAL ARTERY AND POSTERIOR TIBIAL , ENDARTERECTOMY TIBIAL/PERONEAL TRUNK WITH BOVINE PATCH ANGIOPLASTY.;  Surgeon: Laurence Redell CROME, MD;  Location: [facility] OR;  Service: Vascular;  Laterality: Left;   INTRAMEDULLARY (IM) NAIL INTERTROCHANTERIC Right 03/21/2023   Procedure: INTRAMEDULLARY (IM) NAIL INTERTROCHANTERIC;  Surgeon: Cleotilde Barrio, MD;  Location: [facility] ORS;  Service: Orthopedics;  Laterality: Right;   LOWER EXTREMITY ANGIOGRAPHY Left 12/14/2022   Procedure: Lower Extremity Angiography;  Surgeon: Jama Cordella MATSU, MD;  Location: [facility] INVASIVE CV LAB;  Service: Cardiovascular;  Laterality: Left;   LOWER EXTREMITY ANGIOGRAPHY Left 05/20/2023   Procedure: Lower Extremity Angiography;  Surgeon: Marea Selinda RAMAN, MD;  Location: [facility] INVASIVE CV LAB;  Service: Cardiovascular;  Laterality: Left;   LOWER EXTREMITY ANGIOGRAPHY Left 05/26/2023   Procedure: Lower Extremity Angiography;  Surgeon: Marea Selinda RAMAN, MD;  Location: [facility] INVASIVE CV LAB;  Service: Cardiovascular;  Laterality: Left;   LOWER EXTREMITY ANGIOGRAPHY Left 09/25/2024   Procedure: Lower Extremity Angiography;  Surgeon: Jama Cordella MATSU, MD;  Location: [facility] INVASIVE CV LAB;  Service: Cardiovascular;  Laterality: Left;   LOWER EXTREMITY INTERVENTION  05/20/2023   Procedure: LOWER EXTREMITY INTERVENTION;  Surgeon: Marea Selinda RAMAN, MD;  Location: [facility] INVASIVE CV LAB;  Service: Cardiovascular;;   LOWER EXTREMITY INTERVENTION Left 09/25/2024   Procedure: LOWER EXTREMITY INTERVENTION;  Surgeon: Jama Cordella MATSU, MD;  Location: [facility] INVASIVE CV LAB;  Service: Cardiovascular;  Laterality: Left;   PERIPHERAL VASCULAR CATHETERIZATION Left 12/14/2016   Procedure: Abdominal Aortogram w/Lower Extremity;  Surgeon: Lonni RAMAN Blade, MD;  Location: [facility] INVASIVE CV LAB;  Service: Cardiovascular;  Laterality: Left;   right cataract extraction      TRANSTHORACIC ECHOCARDIOGRAM  01/2014   To evaluate possible CVA: EF 55-60%. GR 1 DD. No significant valvular lesions    Social History Social History[2]  Family History Family History  Problem Relation Age of Onset   Hypertension Mother        Does not know history   Heart disease Mother    Stroke Mother    Diabetes Mother    Hypertension Father    Heart disease Father    Stroke Father    Diabetes Sister    Hypertension Sister    Heart disease Brother    Hypertension Brother     Allergies[3]   REVIEW OF SYSTEMS (Negative unless checked)  Constitutional: [] Weight loss  [] Fever  [] Chills Cardiac: [] Chest pain   [] Chest pressure   [] Palpitations   [] Shortness of breath when laying flat   [] Shortness of breath with exertion. Vascular:  [x] Pain in legs with walking   [] Pain in  legs at rest  [] History of DVT   [] Phlebitis   [] Swelling in legs   [] Varicose veins   [] Non-healing ulcers Pulmonary:   [] Uses home oxygen   [] Productive cough   [] Hemoptysis   [] Wheeze  [] COPD   [] Asthma Neurologic:  [] Dizziness   [] Seizures   [] History of stroke   [] History of TIA  [] Aphasia   [] Vissual changes   [] Weakness or numbness in arm   [] Weakness or numbness in leg Musculoskeletal:   [] Joint swelling   [] Joint pain   [] Low back pain Hematologic:  [] Easy bruising  [] Easy bleeding   [] Hypercoagulable state   [] Anemic Gastrointestinal:  [] Diarrhea   [] Vomiting  [] Gastroesophageal reflux/heartburn   [] Difficulty swallowing. Genitourinary:  [] Chronic kidney disease   [] Difficult urination  [] Frequent urination   [] Blood in urine Skin:  [] Rashes   [] Ulcers  Psychological:  [] History of anxiety   []  History of major depression.  Physical Examination  Vitals:   11/21/24 0916  BP: (!) 143/94  Pulse: 69  Resp: 16  Temp: 97.6 F (36.4 C)  TempSrc: Temporal  SpO2: 96%   There is no height or weight on file to calculate BMI. Gen: WD/WN, NAD Head: [place]/AT, No temporalis wasting.  Ear/Nose/Throat:  Hearing grossly intact, nares w/o erythema or drainage Eyes: PER, EOMI, sclera nonicteric.  Neck: Supple, no masses.  No bruit or JVD.  Pulmonary:  Good air movement, no audible wheezing, no use of accessory muscles.  Cardiac: RRR, normal S1, S2, no Murmurs. Vascular:  mild trophic changes, no open wounds Vessel Right Left  Radial Palpable Palpable  PT BKA Not Palpable  DP BKA Not Palpable  Gastrointestinal: soft, non-distended. No guarding/no peritoneal signs.  Musculoskeletal: M/S 5/5 throughout.  No visible deformity.  Neurologic: CN 2-12 intact. Pain and light touch intact in extremities.  Symmetrical.  Speech is fluent. Motor exam as listed above. Psychiatric: Judgment intact, Mood & affect appropriate for pt's clinical situation. Dermatologic: No rashes or ulcers noted.  No changes consistent with cellulitis.   CBC Lab Results  Component Value Date   WBC 10.5 11/14/2024   HGB 12.5 (L) 11/14/2024   HCT 38.6 (L) 11/14/2024   MCV 91.9 11/14/2024   PLT 250 11/14/2024    BMET    Component Value Date/Time   NA 138 10/18/2024 1420   K 4.5 10/18/2024 1420   CL 103 10/18/2024 1420   CO2 20 10/18/2024 1420   GLUCOSE 107 (H) 10/18/2024 1420   GLUCOSE 165 (H) 09/25/2024 2327   BUN 29 (H) 10/18/2024 1420   CREATININE 1.79 (H) 10/18/2024 1420   CALCIUM  9.2 10/18/2024 1420   GFRNONAA 37 (L) 09/25/2024 2327   GFRAA >60 10/02/2017 0943   CrCl cannot be calculated (Patient's most recent lab result is older than the maximum 21 days allowed.).  COAG Lab Results  Component Value Date   INR 1.1 05/18/2023   INR 1.1 03/21/2023   INR 1.00 09/30/2017    Radiology No results found.   Assessment/Plan 1. Atherosclerosis of native artery of left lower extremity with rest pain (HCC) (Primary)  Recommend:   The patient has evidence of severe atherosclerotic changes of both lower extremities associated with rest pain and impending tissue loss of the right foot.  This represents a  limb threatening ischemia and places the patient at a high risk for limb loss.   Angiography has been performed and the situation is not ideal for intervention.  Given this finding open surgical repair is recommended.  Patient should undergo arterial reconstruction, right femoral endarterectomy of the right lower extremity with the hope for limb salvage.  The risks and benefits as well as the alternative therapies was discussed in detail with the patient.  All questions were answered.  Patient agrees to proceed with open vascular surgical reconstruction.   The patient will follow up with me in the office after the procedure.    2. PAD s/p right BKA, history revascularization left leg I believe surgery is indicated as he has profound common femoral and profunda femoris disease and this is his only perfusion to his amputation site.  If this were to progress to occlusion he would likely require a very high level above-knee amputation.   3. Left renal artery stenosis Recommend:   The patient has evidence of atherosclerotic changes of the renal artery.  At this time the patient's blood pressure is fairly well controlled.   Patient does not need angiography of the renal artery given the good control of his hypertension.     However, if at any point the patient's BP becomes acutely worse then further evaluation of the renal artery stenosis and possible intervention would be encouraged.   The patient voices understanding of this plan and agrees.   Patient will follow-up here in the office with renal artery duplex ultrasound as ordered.     4. Essential hypertension Continue antihypertensive medications as already ordered, these medications have been reviewed and there are no changes at this time.   5. Atherosclerosis of native coronary artery of native heart without angina pectoris Continue cardiac and antihypertensive medications as already ordered and reviewed, no changes at this time.    Continue statin as ordered and reviewed, no changes at this time   Nitrates PRN for chest pain   6. DM (diabetes mellitus) with peripheral vascular complication (HCC) Continue hypoglycemic medications as already ordered, these medications have been reviewed and there are no changes at this time.   Hgb A1C to be monitored as already arranged by primary service     Cordella Shawl, MD  11/21/2024 9:55 AM      [1]  Current Meds  Medication Sig   amLODipine  (NORVASC ) 5 MG tablet Take 1 tablet (5 mg total) by mouth daily. (Patient taking differently: Take 5 mg by mouth every morning.)   ascorbic acid  (VITAMIN C ) 500 MG tablet Take 1 tablet (500 mg total) by mouth daily.   aspirin  81 MG tablet Take 81 mg by mouth daily.   Cholecalciferol (VITAMIN D ) 50 MCG (2000 UT) CAPS Take 1 capsule (2,000 Units total) by mouth daily.   icosapent  Ethyl (VASCEPA ) 1 g capsule Take 2 capsules (2 g total) by mouth 2 (two) times daily.   Iron , Ferrous Sulfate , 325 (65 Fe) MG TABS Take 325 mg by mouth every other day. (Patient taking differently: Take 325 mg by mouth daily.)   latanoprost  (XALATAN ) 0.005 % ophthalmic solution Place 1 drop into both eyes at bedtime.   Multiple Vitamin (MULTIVITAMIN WITH MINERALS) TABS tablet Take 1 tablet by mouth daily.   olmesartan  (BENICAR ) 40 MG tablet Take 40 mg by mouth every morning.   rosuvastatin  (CRESTOR ) 40 MG tablet Take 1 tablet (40 mg total) by mouth daily.   senna-docusate (SENOKOT-S) 8.6-50 MG tablet Take 1 tablet by mouth at bedtime as needed for mild constipation.   sertraline  (ZOLOFT ) 100 MG tablet Take 1 tablet (100 mg total) by mouth daily. (Patient taking differently: Take 100 mg by mouth every morning.)  [  2]  Social History Tobacco Use   Smoking status: Some Days    Current packs/day: 15.00    Average packs/day: 15.0 packs/day for 60.0 years (900.0 ttl pk-yrs)    Types: Cigarettes   Smokeless tobacco: Never  Vaping Use   Vaping status: Never  Used  Substance Use Topics   Alcohol use: No   Drug use: No  [3] No Known Allergies

## 2024-11-21 NOTE — Anesthesia Procedure Notes (Signed)
 Arterial Line Insertion Start/End12/17/2025 10:56 AM, 11/21/2024 11:03 AM Performed by: Stevan Fairy POUR, MD, [place], Tamia Dial, CRNA, anesthesiologist  Patient location: OR. Preanesthetic checklist: patient identified, IV checked, site marked, risks and benefits discussed, surgical consent, monitors and equipment checked, pre-op evaluation, timeout performed and anesthesia consent Lidocaine  1% used for infiltration and patient sedated radial was placed Catheter size: 20 G Hand hygiene performed  and maximum sterile barriers used  Allen's test indicative of satisfactory collateral circulation Attempts: 1 Procedure performed using ultrasound to evaluate access site. Ultrasound Notes:relevant anatomy identified, ultrasound used to visualize needle entry and vessel patent under ultrasound. Following insertion, dressing applied and Biopatch. Post procedure assessment: normal and unchanged  Patient tolerated the procedure well with no immediate complications. Additional procedure comments: Vessel visualized on ultrasound. Pulsatile flow confirmed. Performed by SRNA under supervision. Waveform transduced and appropriate. No immediate complications.

## 2024-11-21 NOTE — Anesthesia Procedure Notes (Signed)
 Procedure Name: Intubation Date/Time: 11/21/2024 10:54 AM  Performed by: Carley Pac, RNPre-anesthesia Checklist: Patient identified, Emergency Drugs available, Suction available and Patient being monitored Patient Re-evaluated:Patient Re-evaluated prior to induction Oxygen Delivery Method: Circle system utilized Preoxygenation: Pre-oxygenation with 100% oxygen Induction Type: IV induction Ventilation: Mask ventilation without difficulty Laryngoscope Size: McGrath and 4 Grade View: Grade I Tube type: Oral Tube size: 7.5 mm Number of attempts: 1 Airway Equipment and Method: Stylet and Oral airway Placement Confirmation: ETT inserted through vocal cords under direct vision, positive ETCO2 and breath sounds checked- equal and bilateral Secured at: 22 cm Tube secured with: Tape Dental Injury: Teeth and Oropharynx as per pre-operative assessment

## 2024-11-21 NOTE — Progress Notes (Signed)
 BP checked in right and left arms. Both Bps similar in both arms. Per Dr. Jama follow patient cuff pressure rather than Arterial line pressure.

## 2024-11-21 NOTE — Anesthesia Preprocedure Evaluation (Signed)
 Anesthesia Evaluation  Patient identified by MRN, date of birth, ID band Patient awake    Reviewed: Allergy & Precautions, NPO status , Patient's Chart, lab work & pertinent test results  History of Anesthesia Complications (+) PONV and history of anesthetic complications  Airway Mallampati: III  TM Distance: <3 FB Neck ROM: full    Dental  (+) Missing   Pulmonary COPD, Current Smoker and Patient abstained from smoking.   Pulmonary exam normal        Cardiovascular hypertension, + CAD  Normal cardiovascular exam+ dysrhythmias      Neuro/Psych TIACVA, Residual Symptoms  negative psych ROS   GI/Hepatic negative GI ROS, Neg liver ROS,neg GERD  ,,  Endo/Other  negative endocrine ROSdiabetes, Type 2    Renal/GU Renal disease     Musculoskeletal   Abdominal   Peds  Hematology negative hematology ROS (+)   Anesthesia Other Findings Past Medical History: 05/19/2023: Acute hematogenous osteomyelitis of left foot (HCC) No date: Anemia of chronic disease No date: Aortic atherosclerosis No date: Atherosclerosis of native arteries of extremity with rest  pain (HCC) 08/11/2022: Basal cell carcinoma     Comment:  R forearm - ED&C No date: Bifascicular block No date: Bilateral nephrolithiasis No date: Calculus of gallbladder without cholecystitis 09/01/2023: Cessation of tobacco use in previous 12 months No date: Cholelithiasis No date: CKD (chronic kidney disease) stage 2, GFR 60-89 ml/min No date: Conductive hearing loss in right ear No date: Coronary artery calcification seen on CT scan No date: Critical limb ischemia of right lower extremity (HCC) No date: Depression No date: Diverticulosis No date: DM (diabetes mellitus), type 2 (HCC) 12/12/2016: Foot ulcer (HCC) No date: Hyperlipidemia No date: Hypertension No date: Infrarenal abdominal aortic aneurysm (AAA) without rupture No date: Left renal artery  stenosis No date: Long term current use of clopidogrel  No date: Long-term use of aspirin  therapy No date: Mild cognitive impairment No date: Multiple pulmonary nodules ~2007: PAD (peripheral artery disease)     Comment:  s/p R BKA for non-healing wound No date: PONV (postoperative nausea and vomiting)     Comment:  after Oct 2025 LE intervention 05/24/2023: Protein-calorie malnutrition, severe No date: Status post below-knee amputation of right lower extremity  (HCC) No date: Status post femoral-popliteal bypass surgery 03/2014: Stroke [facility])     Comment:  MRI: Acute nonhemorrhagic left paracentral pontine               infarct. Arterial venous malformation left hippocampus               with nidus measuring  12x9,8 mm ; Left vertebral artery               is occluded. 01/2014: TIA (transient ischemic attack) No date: Vitamin D  deficiency  Past Surgical History: 05/21/2023: AMPUTATION TOE; Left     Comment:  Procedure: AMPUTATION LEFT 1st & 3rd TOES;  Surgeon:               Neill Boas, DPM;  Location: [facility] ORS;  Service:               Podiatry;  Laterality: Left; No date: BELOW KNEE LEG AMPUTATION; Right 12/17/2016: FEMORAL-POPLITEAL BYPASS GRAFT; Left     Comment:  Procedure: BYPASS LEFT FEMORAL TO BELOW POPLITEAL ARTERY              USING PROPATEN GORE GRAFT;  Surgeon: Boas JULIANNA Doing, MD;  Location: [facility] OR;  Service: Vascular;  Laterality: Left; 09/30/2017: FEMORAL-POPLITEAL BYPASS GRAFT; Left     Comment:  Procedure: LEFT LEG ANGIOGRAM,  THROMBECTOMY,               FEM-POPLITEAL BYPASS GRAFT, tHROMBECTOMY PERONEAL ARTERY               AND POSTERIOR TIBIAL , ENDARTERECTOMY TIBIAL/PERONEAL               TRUNK WITH BOVINE PATCH ANGIOPLASTY.;  Surgeon: Laurence Redell CROME, MD;  Location: [facility] OR;  Service: Vascular;                Laterality: Left; 03/21/2023: INTRAMEDULLARY (IM) NAIL INTERTROCHANTERIC; Right     Comment:  Procedure: INTRAMEDULLARY (IM) NAIL  INTERTROCHANTERIC;                Surgeon: Cleotilde Barrio, MD;  Location: [facility] ORS;                Service: Orthopedics;  Laterality: Right; 12/14/2022: LOWER EXTREMITY ANGIOGRAPHY; Left     Comment:  Procedure: Lower Extremity Angiography;  Surgeon:               Jama Cordella MATSU, MD;  Location: [facility] INVASIVE CV LAB;               Service: Cardiovascular;  Laterality: Left; 05/20/2023: LOWER EXTREMITY ANGIOGRAPHY; Left     Comment:  Procedure: Lower Extremity Angiography;  Surgeon: Marea Selinda RAMAN, MD;  Location: [facility] INVASIVE CV LAB;  Service:               Cardiovascular;  Laterality: Left; 05/26/2023: LOWER EXTREMITY ANGIOGRAPHY; Left     Comment:  Procedure: Lower Extremity Angiography;  Surgeon: Marea Selinda RAMAN, MD;  Location: [facility] INVASIVE CV LAB;  Service:               Cardiovascular;  Laterality: Left; 09/25/2024: LOWER EXTREMITY ANGIOGRAPHY; Left     Comment:  Procedure: Lower Extremity Angiography;  Surgeon:               Jama Cordella MATSU, MD;  Location: [facility] INVASIVE CV LAB;               Service: Cardiovascular;  Laterality: Left; 05/20/2023: LOWER EXTREMITY INTERVENTION     Comment:  Procedure: LOWER EXTREMITY INTERVENTION;  Surgeon: Marea Selinda RAMAN, MD;  Location: [facility] INVASIVE CV LAB;  Service:               Cardiovascular;; 09/25/2024: LOWER EXTREMITY INTERVENTION; Left     Comment:  Procedure: LOWER EXTREMITY INTERVENTION;  Surgeon:               Jama Cordella MATSU, MD;  Location: [facility] INVASIVE CV LAB;               Service: Cardiovascular;  Laterality: Left; 12/14/2016: PERIPHERAL VASCULAR CATHETERIZATION; Left     Comment:  Procedure: Abdominal Aortogram w/Lower Extremity;                Surgeon: Lonni RAMAN Blade, MD;  Location: [facility]  INVASIVE CV LAB;  Service: Cardiovascular;  Laterality:               Left; No date: right cataract extraction 01/2014: TRANSTHORACIC ECHOCARDIOGRAM     Comment:  To evaluate possible  CVA: EF 55-60%. GR 1 DD. No               significant valvular lesions     Reproductive/Obstetrics negative OB ROS                              Anesthesia Physical Anesthesia Plan  ASA: 3  Anesthesia Plan: General ETT   Post-op Pain Management:    Induction: Intravenous  PONV Risk Score and Plan: Ondansetron , Dexamethasone , Midazolam  and Treatment may vary due to age or medical condition  Airway Management Planned: Oral ETT  Additional Equipment: Arterial line  Intra-op Plan:   Post-operative Plan: Extubation in OR and Possible Post-op intubation/ventilation  Informed Consent: I have reviewed the patients History and Physical, chart, labs and discussed the procedure including the risks, benefits and alternatives for the proposed anesthesia with the patient or authorized representative who has indicated his/her understanding and acceptance.     Dental Advisory Given  Plan Discussed with: Anesthesiologist, CRNA and Surgeon  Anesthesia Plan Comments: (Patient consented for risks of anesthesia including but not limited to:  - adverse reactions to medications - damage to eyes, teeth, lips or other oral mucosa - nerve damage due to positioning  - sore throat or hoarseness - Damage to heart, brain, nerves, lungs, other parts of body or loss of life  Patient voiced understanding and assent.)        Anesthesia Quick Evaluation

## 2024-11-21 NOTE — Plan of Care (Signed)
  Problem: Clinical Measurements: Goal: Will remain free from infection Outcome: Progressing Goal: Cardiovascular complication will be avoided Outcome: Progressing   Problem: Nutrition: Goal: Adequate nutrition will be maintained Outcome: Progressing   Problem: Pain Managment: Goal: General experience of comfort will improve and/or be controlled Outcome: Progressing   Problem: Safety: Goal: Ability to remain free from injury will improve Outcome: Progressing   Problem: Skin Integrity: Goal: Risk for impaired skin integrity will decrease Outcome: Progressing

## 2024-11-21 NOTE — Transfer of Care (Signed)
 Immediate Anesthesia Transfer of Care Note  Patient: Jordan Arellano  Procedure(s) Performed: ENDARTERECTOMY, FEMORAL (Right) APPLICATION OF CELL SAVER THROMBECTOMY, ARTERY, FEMORAL (Right)  Patient Location: PACU  Anesthesia Type:General  Level of Consciousness: drowsy and patient cooperative  Airway & Oxygen Therapy: Patient Spontanous Breathing and Patient connected to face mask oxygen  Post-op Assessment: Report given to RN and Post -op Vital signs reviewed and stable  Post vital signs: Reviewed and unstable  Last Vitals:  Vitals Value Taken Time  BP 139/75 11/21/24 14:15  Temp 36.4 C 11/21/24 14:15  Pulse 65 11/21/24 14:21  Resp 14 11/21/24 14:21  SpO2 100 % 11/21/24 14:21  Vitals shown include unfiled device data.  Last Pain:  Vitals:   11/21/24 0916  TempSrc: Temporal  PainSc: 0-No pain         Complications: No notable events documented.

## 2024-11-21 NOTE — Progress Notes (Signed)
 MRN : [id number]  Jordan Arellano is a 73 y.o. (09/16/1951) male who presents with chief complaint of check circulation.  History of Present Illness:  Patient patient presents to [facility] today for treatment of his atherosclerotic occlusive disease right lower extremity.  He was last seen in the office September 27, 2024.  He is status post angiogram with intervention on 09/25/2024.    Procedure: Diagnostic angiogram which showed:  The patient will require a left femoral to tibial bypass it appears to be the posterior tibial but on table angiography should be performed at the time of surgery given the anomalous distal outflow.  There is documentation that he has had previous vein mapping which was not adequate for bypass in which case a distal flow graft would be the likely choice.   On the right the patient should undergo endarterectomy of the common femoral and profunda femoris given the extensive nature of the disease if not treated he would likely progress to an above-knee amputation.   Given the redo and extensive nature of both of these situations they would be done separately.   The patient notes he is having significant rest pain symptoms right side is worse than the left. No new ulcers or wounds have occurred since the last visit.   There have been no significant changes to the patient's overall health care.   No documented history of amaurosis fugax or recent TIA symptoms. There are no recent neurological changes noted. No documented history of DVT, PE or superficial thrombophlebitis. The patient denies recent episodes of angina or shortness of breath  Active Medications[1]  Past Medical History:  Diagnosis Date   Acute hematogenous osteomyelitis of left foot (HCC) 05/19/2023   Anemia of chronic disease    Aortic atherosclerosis    Atherosclerosis of native arteries of extremity with  rest pain (HCC)    Basal cell carcinoma 08/11/2022   R forearm - ED&C   Bifascicular block    Bilateral nephrolithiasis    Calculus of gallbladder without cholecystitis    Cessation of tobacco use in previous 12 months 09/01/2023   Cholelithiasis    CKD (chronic kidney disease) stage 2, GFR 60-89 ml/min    Conductive hearing loss in right ear    Coronary artery calcification seen on CT scan    Critical limb ischemia of right lower extremity (HCC)    Depression    Diverticulosis    DM (diabetes mellitus), type 2 (HCC)    Foot ulcer (HCC) 12/12/2016   Hyperlipidemia    Hypertension    Infrarenal abdominal aortic aneurysm (AAA) without rupture    Left renal artery stenosis    Long term current use of clopidogrel     Long-term use of aspirin  therapy    Mild cognitive impairment    Multiple pulmonary nodules    PAD (peripheral artery disease) ~2007   s/p R BKA for non-healing wound   PONV (postoperative nausea and vomiting)    after Oct 2025 LE intervention   Protein-calorie malnutrition, severe 05/24/2023   Status post below-knee amputation of right lower extremity (HCC)  Status post femoral-popliteal bypass surgery    Stroke (HCC) 03/2014   MRI: Acute nonhemorrhagic left paracentral pontine infarct. 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   TIA (transient ischemic attack) 01/2014   Vitamin D  deficiency     Past Surgical History:  Procedure Laterality Date   AMPUTATION TOE Left 05/21/2023   Procedure: AMPUTATION LEFT 1st & 3rd TOES;  Surgeon: Neill Boas, DPM;  Location: [facility] ORS;  Service: Podiatry;  Laterality: Left;   BELOW KNEE LEG AMPUTATION Right    FEMORAL-POPLITEAL BYPASS GRAFT Left 12/17/2016   Procedure: BYPASS LEFT FEMORAL TO BELOW POPLITEAL ARTERY USING PROPATEN GORE GRAFT;  Surgeon: Boas JULIANNA Doing, MD;  Location: [facility] OR;  Service: Vascular;  Laterality: Left;   FEMORAL-POPLITEAL BYPASS GRAFT  Left 09/30/2017   Procedure: LEFT LEG ANGIOGRAM,  THROMBECTOMY, FEM-POPLITEAL BYPASS GRAFT, tHROMBECTOMY PERONEAL ARTERY AND POSTERIOR TIBIAL , ENDARTERECTOMY TIBIAL/PERONEAL TRUNK WITH BOVINE PATCH ANGIOPLASTY.;  Surgeon: Laurence Redell CROME, MD;  Location: [facility] OR;  Service: Vascular;  Laterality: Left;   INTRAMEDULLARY (IM) NAIL INTERTROCHANTERIC Right 03/21/2023   Procedure: INTRAMEDULLARY (IM) NAIL INTERTROCHANTERIC;  Surgeon: Cleotilde Barrio, MD;  Location: [facility] ORS;  Service: Orthopedics;  Laterality: Right;   LOWER EXTREMITY ANGIOGRAPHY Left 12/14/2022   Procedure: Lower Extremity Angiography;  Surgeon: Jama Cordella MATSU, MD;  Location: [facility] INVASIVE CV LAB;  Service: Cardiovascular;  Laterality: Left;   LOWER EXTREMITY ANGIOGRAPHY Left 05/20/2023   Procedure: Lower Extremity Angiography;  Surgeon: Marea Selinda RAMAN, MD;  Location: [facility] INVASIVE CV LAB;  Service: Cardiovascular;  Laterality: Left;   LOWER EXTREMITY ANGIOGRAPHY Left 05/26/2023   Procedure: Lower Extremity Angiography;  Surgeon: Marea Selinda RAMAN, MD;  Location: [facility] INV[facility]IVE CV LAB;  Service: Cardiovascular;  Laterality: Left;   LOWER EXTREMITY ANGIOGRAPHY Left 09/25/2024   Procedure: Lower Extremity Angiography;  Surgeon: Jama Cordella MATSU, MD;  Location: [facility] INVASIVE CV [facility];  Service: Cardiovascular;  Laterality: Left;   LOWER EXTREMITY INTERVENTION  05/20/2023   Procedure: LOWER EXTREMITY INTERVENTION;  Surgeon: Marea Selinda RAMAN, MD;  Location: [facility] INV[facility] CV LAB;  Service: Cardiovascular;;   LOWER EXTREMITY INTERVENTION Left 09/25/2024   Procedure: LOWER EXTREMITY INTERVENTION;  Surgeon: Jama Cordella MATSU, MD;  Location: [facility] INVASIVE CV LAB;  Service: Cardiovascular;  Laterality: Left;   PERIPHERAL VASCULAR CATHETERIZATION Left 12/14/2016   Procedure: Abdominal Aortogram w/Lower Extremity;  Surgeon: Lonni RAMAN Blade, MD;  Location: [facility] INVASIVE CV LAB;  Service: Cardiovascular;  Laterality: Left;   right cataract extraction      TRANSTHORACIC ECHOCARDIOGRAM  01/2014   To evaluate possible CVA: EF 55-60%. GR 1 DD. No significant valvular lesions    Social History Social History[2]  Family History Family History  Problem Relation Age of Onset   Hypertension Mother        Does not know history   Heart disease Mother    Stroke Mother    Diabetes Mother    Hypertension Father    Heart disease Father    Stroke Father    Diabetes Sister    Hypertension Sister    Heart disease Brother    Hypertension Brother     Allergies[3]   REVIEW OF SYSTEMS (Negative unless checked)  Constitutional: [] Weight loss  [] Fever  [] Chills Cardiac: [] Chest pain   [] Chest pressure   [] Palpitations   [] Shortness of breath when laying flat   [] Shortness of breath with exertion. Vascular:  [x] Pain in legs with walking   [] Pain in  legs at rest  [] History of DVT   [] Phlebitis   [] Swelling in legs   [] Varicose veins   [] Non-healing ulcers Pulmonary:   [] Uses home oxygen   [] Productive cough   [] Hemoptysis   [] Wheeze  [] COPD   [] Asthma Neurologic:  [] Dizziness   [] Seizures   [] History of stroke   [] History of TIA  [] Aphasia   [] Vissual changes   [] Weakness or numbness in arm   [] Weakness or numbness in leg Musculoskeletal:   [] Joint swelling   [] Joint pain   [] Low back pain Hematologic:  [] Easy bruising  [] Easy bleeding   [] Hypercoagulable state   [] Anemic Gastrointestinal:  [] Diarrhea   [] Vomiting  [] Gastroesophageal reflux/heartburn   [] Difficulty swallowing. Genitourinary:  [] Chronic kidney disease   [] Difficult urination  [] Frequent urination   [] Blood in urine Skin:  [] Rashes   [] Ulcers  Psychological:  [] History of anxiety   []  History of major depression.  Physical Examination  Vitals:   11/21/24 0916  BP: (!) 143/94  Pulse: 69  Resp: 16  Temp: 97.6 F (36.4 C)  TempSrc: Temporal  SpO2: 96%   There is no height or weight on file to calculate BMI. Gen: WD/WN, NAD Head: [place]/AT, No temporalis wasting.  Ear/Nose/Throat:  Hearing grossly intact, nares w/o erythema or drainage Eyes: PER, EOMI, sclera nonicteric.  Neck: Supple, no masses.  No bruit or JVD.  Pulmonary:  Good air movement, no audible wheezing, no use of accessory muscles.  Cardiac: RRR, normal S1, S2, no Murmurs. Vascular:  mild trophic changes, no open wounds Vessel Right Left  Radial Palpable Palpable  PT BKA Not Palpable  DP BKA Not Palpable  Gastrointestinal: soft, non-distended. No guarding/no peritoneal signs.  Musculoskeletal: M/S 5/5 throughout.  No visible deformity.  Neurologic: CN 2-12 intact. Pain and light touch intact in extremities.  Symmetrical.  Speech is fluent. Motor exam as listed above. Psychiatric: Judgment intact, Mood & affect appropriate for pt's clinical situation. Dermatologic: No rashes or ulcers noted.  No changes consistent with cellulitis.   CBC Lab Results  Component Value Date   WBC 10.5 11/14/2024   HGB 12.5 (L) 11/14/2024   HCT 38.6 (L) 11/14/2024   MCV 91.9 11/14/2024   PLT 250 11/14/2024    BMET    Component Value Date/Time   NA 138 10/18/2024 1420   K 4.5 10/18/2024 1420   CL 103 10/18/2024 1420   CO2 20 10/18/2024 1420   GLUCOSE 107 (H) 10/18/2024 1420   GLUCOSE 165 (H) 09/25/2024 2327   BUN 29 (H) 10/18/2024 1420   CREATININE 1.79 (H) 10/18/2024 1420   CALCIUM  9.2 10/18/2024 1420   GFRNONAA 37 (L) 09/25/2024 2327   GFRAA >60 10/02/2017 0943   CrCl cannot be calculated (Patient's most recent lab result is older than the maximum 21 days allowed.).  COAG Lab Results  Component Value Date   INR 1.1 05/18/2023   INR 1.1 03/21/2023   INR 1.00 09/30/2017    Radiology No results found.   Assessment/Plan 1. Atherosclerosis of native artery of left lower extremity with rest pain (HCC) (Primary)  Recommend:   The patient has evidence of severe atherosclerotic changes of both lower extremities associated with rest pain and impending tissue loss of the right foot.  This represents a  limb threatening ischemia and places the patient at a high risk for limb loss.   Angiography has been performed and the situation is not ideal for intervention.  Given this finding open surgical repair is recommended.  Patient should undergo arterial reconstruction, right femoral endarterectomy of the right lower extremity with the hope for limb salvage.  The risks and benefits as well as the alternative therapies was discussed in detail with the patient.  All questions were answered.  Patient agrees to proceed with open vascular surgical reconstruction.   The patient will follow up with me in the office after the procedure.    2. PAD s/p right BKA, history revascularization left leg I believe surgery is indicated as he has profound common femoral and profunda femoris disease and this is his only perfusion to his amputation site.  If this were to progress to occlusion he would likely require a very high level above-knee amputation.   3. Left renal artery stenosis Recommend:   The patient has evidence of atherosclerotic changes of the renal artery.  At this time the patient's blood pressure is fairly well controlled.   Patient does not need angiography of the renal artery given the good control of his hypertension.     However, if at any point the patient's BP becomes acutely worse then further evaluation of the renal artery stenosis and possible intervention would be encouraged.   The patient voices understanding of this plan and agrees.   Patient will follow-up here in the office with renal artery duplex ultrasound as ordered.     4. Essential hypertension Continue antihypertensive medications as already ordered, these medications have been reviewed and there are no changes at this time.   5. Atherosclerosis of native coronary artery of native heart without angina pectoris Continue cardiac and antihypertensive medications as already ordered and reviewed, no changes at this time.    Continue statin as ordered and reviewed, no changes at this time   Nitrates PRN for chest pain   6. DM (diabetes mellitus) with peripheral vascular complication (HCC) Continue hypoglycemic medications as already ordered, these medications have been reviewed and there are no changes at this time.   Hgb A1C to be monitored as already arranged by primary service     Cordella Shawl, MD  11/21/2024 9:55 AM      [1]  Current Meds  Medication Sig   amLODipine  (NORVASC ) 5 MG tablet Take 1 tablet (5 mg total) by mouth daily. (Patient taking differently: Take 5 mg by mouth every morning.)   ascorbic acid  (VITAMIN C ) 500 MG tablet Take 1 tablet (500 mg total) by mouth daily.   aspirin  81 MG tablet Take 81 mg by mouth daily.   Cholecalciferol (VITAMIN D ) 50 MCG (2000 UT) CAPS Take 1 capsule (2,000 Units total) by mouth daily.   icosapent  Ethyl (VASCEPA ) 1 g capsule Take 2 capsules (2 g total) by mouth 2 (two) times daily.   Iron , Ferrous Sulfate , 325 (65 Fe) MG TABS Take 325 mg by mouth every other day. (Patient taking differently: Take 325 mg by mouth daily.)   latanoprost  (XALATAN ) 0.005 % ophthalmic solution Place 1 drop into both eyes at bedtime.   Multiple Vitamin (MULTIVITAMIN WITH MINERALS) TABS tablet Take 1 tablet by mouth daily.   olmesartan  (BENICAR ) 40 MG tablet Take 40 mg by mouth every morning.   rosuvastatin  (CRESTOR ) 40 MG tablet Take 1 tablet (40 mg total) by mouth daily.   senna-docusate (SENOKOT-S) 8.6-50 MG tablet Take 1 tablet by mouth at bedtime as needed for mild constipation.   sertraline  (ZOLOFT ) 100 MG tablet Take 1 tablet (100 mg total) by mouth daily. (Patient taking differently: Take 100 mg by mouth every morning.)  [  2]  Social History Tobacco Use   Smoking status: Some Days    Current packs/day: 15.00    Average packs/day: 15.0 packs/day for 60.0 years (900.0 ttl pk-yrs)    Types: Cigarettes   Smokeless tobacco: Never  Vaping Use   Vaping status: Never  Used  Substance Use Topics   Alcohol use: No   Drug use: No  [3] No Known Allergies

## 2024-11-21 NOTE — Op Note (Signed)
 OPERATIVE NOTE   PROCEDURE: 1.   Right common femoral endarterectomy and patch angioplasty 2.   Right iliac thromboembolectomy with 4 Fogarty embolectomy balloon 3.   Separate and distinct right profunda femoris artery endarterectomy and separate patch angioplasty 4.   Placement of antibiotic impregnated beads 5.   Redo femoral exposure after previous right femoral to popliteal bypass    PRE-OPERATIVE DIAGNOSIS: 1.Atherosclerotic occlusive disease right lower extremities with rest pain 2. Diabetes, Hypertension  POST-OPERATIVE DIAGNOSIS: Same  SURGEON: Selinda Gu, MD  CO-surgeon:  Cordella Shawl, MD  ANESTHESIA:  general  ESTIMATED BLOOD LOSS: 450 cc  FINDING(S): 1.  significant plaque in right common femoral, profunda femoris, and superficial femoral arteries  SPECIMEN(S):  Right common femoral artery plaque Right iliac thrombus Right profunda femorus artery plaque.  INDICATIONS:    Patient presents with recurrent rest pain symptoms and a known history of severe PAD and multiple previous surgeries and intervention.  Right femoral endarterectomy is planned to try to improve perfusion.  The risks and benefits as well as alternative therapies including intervention were reviewed in detail all questions were answered the patient agrees to proceed with surgery.  DESCRIPTION: After obtaining full informed written consent, the patient was brought back to the operating room and placed supine upon the operating table.  The patient received IV antibiotics prior to induction.  After obtaining adequate anesthesia, the patient was prepped and draped in the standard fashion appropriate time out is called.    Vertical incision was created overlying the right femoral arteries. The common femoral artery proximally, and superficial femoral artery, and primary and secondary profunda femoris artery branches were encircled with vessel loops and prepared for control. The right femoral arteries were  found to have significant plaque from the common femoral artery into the profunda and superficial femoral arteries. There was a very steep angle of the profunda femorus artery and we would not be able to use one long patch to treat the profunda femorus and the common femoral artery. The dissection was extremely tedious due to the extensive scar tissue encountered from his previous femoral to popliteal bypass.  The proximal portion of the femoral-popliteal bypass distal portion of the common femoral artery and this had to be dissected out and would have to be resected as part of the endarterectomy.  This also created in part the angle and tenting effect of the profunda femoris artery that would make it require a separate arteriotomy and patch angioplasty.   6000 units of heparin  was given and allowed circulate for 5 minutes.   Attention is then turned to the right common femoral artery.  Control was created with a vascular clamp the most distal external iliac artery and the distal main profunda femoris artery with the Vesseloops being pulled up for control on profunda femoris branches and the native SFA.  An arteriotomy is made with 11 blade and extended with Potts scissors in the common femoral artery and carried down to the previous femoral to popliteal bypass that was then removed off of the artery in its entirety and partially resected proximally.  This became the distal area of the arteriotomy in the common femoral artery. An endarterectomy was then performed. The [facility] was used to create a plane. The proximal endpoint was created with gentle traction up to the clamp at the distal external iliac artery, but there remained no flow proximally.  The clamp was taken off and the iliac artery was clearly occluded.  This was an unexpected  change from the angiogram preoperatively about 2 months prior.  The thrombosed iliac artery was then addressed with a total of 3 passes with the 4 Fogarty embolectomy  balloon that was able to be passed up about 30 cm which would correlate as into the aorta.  A large amount of subacute to chronic appearing thrombus was removed with the Fogarty embolectomy balloon and there was resultant brisk flow seen when control was released from the distal external iliac artery. This was in the proximal common femoral artery. The distal endpoint of the common femoral artery endarterectomy was created with gentle traction and the distal endpoint was fairly clean.  The bovine pericardial patcth is then selected and prepared for a patch angioplasty.  It is cut and beveled and started at the proximal endpoint with a 6-0 Prolene suture. A second 6-0 Prolene was started at the distal end point. Approximately one half of the suture line is run medially and laterally from the proximal suture line.  We then ran medially and laterally from the distal suture line and run to the mid portion to complete the arteriotomy.  The vessel was flushed prior to release of control and completion of the anastomosis.   Attention was then turned to the profunda femoris artery.  The disease was well out beyond the point could have been reached with a single arteriotomy in the ankle was also very poor for a single patch.  A separate arteriotomy was created in the main profunda femoris artery and taken down beyond primary branches.  This was extended with Potts scissors both proximally and distally.  The Freer elevator was then used to remove a large amount of bulky plaque from the profunda femoris artery.  There was still some posterior plaque distally as far down as we could dissected out and this was tacked down with 7-0 Prolene sutures but was not flow-limiting.  A separate bovine pericardial patch angioplasty was then performed.  A 6-0 Prolene was started at the distal endpoint.  The proximal endpoint was then created with a another 6-0 Prolene after cutting beveling the patch to the appropriate length.  The suture  line was then created running medially and laterally first with the proximal suture line to the midportion and then with the distal suture lodged in the midportion.  The vessel was flushed and de-aired on release of control.  At this point, flow was established first to the profunda femoris artery and then to the superficial femoral artery. Easily palpable pulses are noted well beyond the anastomosis in the profunda femoris artery.  6-0 Prolene patch sutures were used for hemostasis as needed.  Once hemostasis was complete, we irrigated with 1 L of Vashe irrigation.  Gentamicin  and vancomycin  impregnated beads were then placed into the wound.  Fibrillar and hemoblast topical hemostatic agents were placed in the femoral incision and hemostasis was complete. The femoral incision was then closed in a layered fashion with 2 layers of 2-0 Vicryl, 2 layers of 3-0 Vicryl, and staples for the skin closure. Sterile dressing were then placed over the incision.  The patient was then awakened from anesthesia and taken to the recovery room in stable condition having tolerated the procedure well.  COMPLICATIONS: None  CONDITION: Stable     Selinda Gu 11/21/2024 3:27 PM   This note was created with Dragon Medical transcription system. Any errors in dictation are purely unintentional.

## 2024-11-21 NOTE — Op Note (Signed)
 OPERATIVE NOTE   PROCEDURE: Right common femoral endarterectomy with bovine pericardial patch angioplasty. Separate and distinct right profunda femoris endarterectomy with bovine pericardial patch angioplasty. Fogarty thromboembolectomy of the right common iliac and external iliac arteries. Redo vascular surgery right lower extremity  PRE-OPERATIVE DIAGNOSIS: Atherosclerotic occlusive disease right lower extremity with rest pain symptoms; hypertension; diabetes mellitus  POST-OPERATIVE DIAGNOSIS: Same  CO-SURGEON: Cordella JUDITHANN Shawl, MD and Selinda CANDIE Gu, M.D.  ASSISTANT(S): None  ANESTHESIA: general  ESTIMATED BLOOD LOSS: 450 cc  FINDING(S): Profound combination calcific plaque with hyperplasia noted of the right common femoral.  Profound calcific plaque right profunda femoris.  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  SPECIMEN(S):  Plaque from the right common femoral and the right profunda femoris artery.  Subacute thrombus from the right iliac arteries  INDICATIONS:   Jordan Arellano 73 y.o. y.o.male who presents with complaints of lifestyle limiting claudication and pain continuously in the right lower extremity. The patient has documented severe atherosclerotic occlusive disease and has undergone minimally invasive treatments in the past. However, at this point his primary area of stricture stenosis resides in the common femoral and origins of the superficial femoral and profunda femoris extending into these arteries and therefore this is not amenable to intervention. The patient is therefore undergoing open endarterectomy. The risks and benefits of surgery have been reviewed with the patient, all questions have answered; alternative therapies  have been reviewed as well and the patient has agreed to proceed with surgical open repair.  DESCRIPTION: After obtaining full informed written consent, the patient was brought back to the operating room and placed supine upon the operating table.  The patient received IV antibiotics prior to induction.  After obtaining adequate anesthesia, the patient was prepped and draped in the standard fashion for right femoral exposure.  Co-surgeons are required because this is a complicated procedure with work being performed simultaneously by both surgeons.  This expedites the procedure making a shorter operative time reducing complications and improving patient safety.  Attention was turned to the right groin with Dr. Gu working on the patient's left and myself working on the right of the patient.  Vertical  Incision was made over the right common femoral artery and dissection carried down to the common femoral artery with electrocautery.  I dissected out the common femoral artery from the distal external iliac artery (identified by the superficial circumflex vessels) down to the femoral bifurcation.  This was a very difficult and meticulous dissection secondary to extensive scar tissue from his previous surgery.  Also the PTFE femoral-popliteal bypass which was occluded onto the common femoral was encountered this was also dissected circumferentially in its proximal portion.  On initial inspection, the common femoral artery was: densely calcified and there was no palpable pulse noted.    Subsequently the dissection was continued to include all circumflex branches and the profunda femoral artery, the proximal PTFE graft as noted above and the proximal 4 to 5 cm of superficial femoral artery (to allow for better retraction and further dissection of the profunda femoris).  The profunda femoris was dissected circumferentially out to the fifth order branches, which were individually looped with vessel loops.  It should be  noted the dissection on the profunda femoris was carried so far distally several deep venous crossing branches were required to  be ligated which was done with 2-0 silk ties and 5-0 Prolene sutures.  Control of all branches was obtained with vessel loops.  A softer area in the distal external iliac artery amendable to clamping was identified.    The patient was given 6000 units of Heparin  intravenously (later in the case an additional 2000 units was given), which was a therapeutic bolus.   After waiting 3 minutes, the distal external iliac artery was clamped and all of the vessel loops were placed under tension.  First, using the 11 blade the PTFE graft was transected 1 to 2 cm distal to the anastomosis and then using this stump as a lever arm the hood of the graft was removed from the common femoral artery exposing the suture line which was then debrided in its entirety removing all prosthetic material and Prolene suture remnants.  The arteriotomy was then extended proximally in the common femoral artery with an 11-blade and with a Potts scissor.    Endarterectomy was then performed under direct visualization using a freer elevator and a right angle from the mid common femoral extending up both proximally and distally. Proximally the endarterectomy was brought up to the level of the clamp where a clean edge was not initially obtained.  This necessitated advancing the profunda femoris clamp more proximally along the external iliac artery and then extending the endarterectomy.  Ultimately the endarterectomy was extended up to the distal margin of the previously placed external iliac stent.  It was at this point that we identified that there was no forward flow through the iliac artery.  A #4 Fogarty was then opened onto the field a total of 3 passes were made with retrieval of a small amount of thrombotic material after the first 1 but a large plug of thrombus after the second 1 following which excellent forward  flow was noted.  This plug of thrombotic material was passed off as a separate specimen for pathology.  1 more pass was made and there was no further return of thrombus with excellent inflow.  Heparin  on a Marks tip was then used to flush the iliac artery in a retrograde fashion.  Attention was then returned to the common femoral endarterectomy distally, the endarterectomy was carried down to a spot in the SFA.    At this point, a bovine pericardial patch was fashioned for the geometry of the common femoral arteriotomy.  The pericardial patch was sewn to the artery with 2 running stitches of 6-0 Prolene, running from each end.  The endarterectomy site was then irrigated copiously with heparinized saline. The patch angioplasty was completed in the usual fashion.    Having completed the common femoral endarterectomy and patch attention was now turned to the profunda femoris.  A separate and distinct incision was made in the mid profunda femoris with an 11 blade and then extended with Potts scissors proximally.  Profunda femoris endarterectomy was then performed under direct visualization down to the distal edge where the plaque was transected and then tacked with 2 interrupted 7-0 Prolene sutures.  The remnant of the bovine patch was then brought onto the field and applied in a similar fashion to the common femoral however this is an entirely separate patch and was applied using two 6-0 Prolene's 1 at the distal end and 1 at the proximal end.  We then began sewing the proximal medial quadrant followed by the proximal lateral quadrant then the distal lateral quadrant securing the 6-0 Prolene sutures in the  mid portion of the patch.  And finishing the patch from the distal medial quadrant.  Prior to completing the suture line flushing maneuvers were performed excellent inflow was noted the patch was irrigated with heparinized saline and flow was reestablished.  Excellent distal profunda femoris pulse was noted.  Any  bleeding points along the suture lines of both patches were controlled with interrupted 6-0 Prolene suture.  The right groin was then irrigated copiously with Vashe and subsequently hemoblast and fibrillar were placed in the wound.  Additionally, antibiotic beads using vancomycin  and gentamicin  were also placed in the bed of the wound.  The incision was repaired with a double layer of 2-0 Vicryl, a double layer of 3-0 Vicryl, and staples were used to approximate the skin.  Honeycomb dressing was then applied to the groin.   COMPLICATIONS: None  CONDITION: Metta Cordella JUDITHANN Jama, M.D. [facility] Office: [phone]  11/21/2024, 2:02 PM

## 2024-11-22 ENCOUNTER — Encounter: Payer: Self-pay | Admitting: Vascular Surgery

## 2024-11-22 DIAGNOSIS — Z95828 Presence of other vascular implants and grafts: Secondary | ICD-10-CM

## 2024-11-22 DIAGNOSIS — I70261 Atherosclerosis of native arteries of extremities with gangrene, right leg: Secondary | ICD-10-CM

## 2024-11-22 DIAGNOSIS — Z9889 Other specified postprocedural states: Secondary | ICD-10-CM

## 2024-11-22 LAB — CBC
HCT: 29 % — ABNORMAL LOW (ref 39.0–52.0)
Hemoglobin: 9.2 g/dL — ABNORMAL LOW (ref 13.0–17.0)
MCH: 29.8 pg (ref 26.0–34.0)
MCHC: 31.7 g/dL (ref 30.0–36.0)
MCV: 93.9 fL (ref 80.0–100.0)
Platelets: 144 K/uL — ABNORMAL LOW (ref 150–400)
RBC: 3.09 MIL/uL — ABNORMAL LOW (ref 4.22–5.81)
RDW: 15.1 % (ref 11.5–15.5)
WBC: 10.2 K/uL (ref 4.0–10.5)
nRBC: 0 % (ref 0.0–0.2)

## 2024-11-22 LAB — BASIC METABOLIC PANEL WITH GFR
Anion gap: 11 (ref 5–15)
BUN: 11 mg/dL (ref 8–23)
CO2: 23 mmol/L (ref 22–32)
Calcium: 8.2 mg/dL — ABNORMAL LOW (ref 8.9–10.3)
Chloride: 104 mmol/L (ref 98–111)
Creatinine, Ser: 1.05 mg/dL (ref 0.61–1.24)
GFR, Estimated: >60 mL/min (ref >60)
Glucose, Bld: 123 mg/dL — ABNORMAL HIGH (ref 70–99)
Potassium: 4 mmol/L (ref 3.5–5.1)
Sodium: 137 mmol/L (ref 135–145)

## 2024-11-22 LAB — GLUCOSE, CAPILLARY
Glucose-Capillary: 121 mg/dL — ABNORMAL HIGH (ref 70–99)
Glucose-Capillary: 145 mg/dL — ABNORMAL HIGH (ref 70–99)
Glucose-Capillary: 133 mg/dL — ABNORMAL HIGH (ref 70–99)
Glucose-Capillary: 157 mg/dL — ABNORMAL HIGH (ref 70–99)
Glucose-Capillary: 126 mg/dL — ABNORMAL HIGH (ref 70–99)

## 2024-11-22 MED FILL — Sodium Bicarbonate IV Soln 8.4%: INTRAVENOUS | Qty: 50 | Status: AC

## 2024-11-22 NOTE — Progress Notes (Addendum)
 Patient received from ICU via bed. Call light in reach. Patient is eating supper and has family at bedside. R fem access site CDI. Patient verbal. Room air. Requested privacy and urinal, provided. R BKA. Pedal pulses positive to LLE.   Jordan Arellano

## 2024-11-22 NOTE — Anesthesia Postprocedure Evaluation (Signed)
 Anesthesia Post Note  Patient: Jordan Arellano  Procedure(s) Performed: ENDARTERECTOMY, FEMORAL (Right) APPLICATION OF CELL SAVER THROMBECTOMY, ARTERY, FEMORAL (Right)  Patient location during evaluation: ICU Anesthesia Type: General Level of consciousness: awake and alert and oriented Pain management: pain level controlled Vital Signs Assessment: post-procedure vital signs reviewed and stable Respiratory status: spontaneous breathing and respiratory function stable Cardiovascular status: blood pressure returned to baseline Postop Assessment: no apparent nausea or vomiting, adequate PO intake and able to ambulate Anesthetic complications: no   No notable events documented.   Last Vitals:  Vitals:   11/22/24 0600 11/22/24 0611  BP: 128/77 128/77  Pulse: (!) 58   Resp: 16   Temp:    SpO2: 99%     Last Pain:  Vitals:   11/22/24 0558  TempSrc:   PainSc: 2                  Koralyn Prestage D Bettyann Birchler

## 2024-11-22 NOTE — Evaluation (Signed)
 Occupational Therapy Evaluation Patient Details Name: Jordan Arellano MRN: [id number] DOB: [birth year] Today's Date: 11/22/2024   History of Present Illness   Pt is a 73 year old male Right common femoral endarterectomy and patch angioplasty, Right iliac thromboembolectomy with 4 Fogarty embolectomy balloon,  Separate and distinct right profunda femoris artery endarterectomy and separate patch angioplasty,  Placement of antibiotic impregnated beads, Redo femoral exposure after previous right femoral to popliteal bypass on 11/21/24     Clinical Impressions Chart reviewed to date, pt greeted semi supine in bed, agreeable to OT evaluation. Pt is alert and oriented x4. PTA he reports he is generally MOD I for ADL, assist for IADL PRN from sister. He amb with prosthesis and SPC. Pt presents with deficits in strength, endurance, activity tolerance, balance, affecting safe and optimal ADL completion. He performs bed mobility with CGA, defers donning prosthetic on this date (in agreement to trial in future dates). He performs a squat pivot transfer to bsc with CGA. SET UP for feeding/grooming tasks. Educated pt re use of DME/AE for safer ADL completion at home. Pt is performing ADL/functional mobility below PLOF, will benefit from acute OT to address functional deficits and to facilitate optimal ADL/functional mobility performance. Pt is left in bedside chair, all needs met. OT will follow.      If plan is discharge home, recommend the following:   A little help with bathing/dressing/bathroom;A little help with walking and/or transfers     Functional Status Assessment   Patient has had a recent decline in their functional status and demonstrates the ability to make significant improvements in function in a reasonable and predictable amount of time.     Equipment Recommendations   BSC/3in1     Recommendations for Other Services         Precautions/Restrictions    Precautions Precautions: Fall Recall of Precautions/Restrictions: Intact Precaution/Restrictions Comments: old R BKA Restrictions Weight Bearing Restrictions Per Provider Order: No     Mobility Bed Mobility Overal bed mobility: Needs Assistance Bed Mobility: Supine to Sit     Supine to sit: Contact guard, HOB elevated     General bed mobility comments: intermittent vcs for technique    Transfers Overall transfer level: Needs assistance   Transfers: Bed to chair/wheelchair/BSC     Squat pivot transfers: Contact guard assist              Balance Overall balance assessment: Needs assistance Sitting-balance support: Feet supported Sitting balance-Leahy Scale: Good         Standing balance comment: pt declined to donn prosthetic on this date, in agreement to trial for future sessions                           ADL either performed or assessed with clinical judgement   ADL Overall ADL's : Needs assistance/impaired Eating/Feeding: Set up;Sitting   Grooming: Set up;Sitting               Lower Body Dressing: Moderate assistance Lower Body Dressing Details (indicate cue type and reason): anticipate Toilet Transfer: Contact guard assist;Squat-pivot Toilet Transfer Details (indicate cue type and reason): simulated to bedside chair                 Vision Patient Visual Report: No change from baseline       Perception         Praxis         Pertinent Vitals/Pain Pain Assessment Pain  Assessment: 0-10 Pain Score: 9  Pain Location: RLE Pain Descriptors / Indicators: Burning Pain Intervention(s): Monitored during session, Repositioned, Premedicated before session     Extremity/Trunk Assessment Upper Extremity Assessment Upper Extremity Assessment: Overall WFL for tasks assessed   Lower Extremity Assessment Lower Extremity Assessment: RLE deficits/detail RLE Deficits / Details: old R BKA       Communication  Communication Communication: No apparent difficulties   Cognition Arousal: Alert Behavior During Therapy: WFL for tasks assessed/performed Cognition: No apparent impairments                               Following commands: Intact       Cueing  General Comments   Cueing Techniques: Verbal cues  vss throughout, dressing appears the same pre/post session   Exercises Other Exercises Other Exercises: edu re role of OT, role of rehab, discharge recommendations, use of AE/DME for safer ADL completion   Shoulder Instructions      Home Living Family/patient expects to be discharged to:: Private residence Living Arrangements: Alone Available Help at Discharge: Family;Available PRN/intermittently Type of Home: Mobile home Home Access: Ramped entrance     Home Layout: One level     Bathroom Shower/Tub: Producer, Television/film/video: Handicapped height     Home Equipment: Cane - single point;Shower seat;Wheelchair - manual   Additional Comments: R prosthetic for BKA      Prior Functioning/Environment Prior Level of Function : Needs assist             Mobility Comments: MOD I with SPC and R prosthetic ADLs Comments: pt reports generally MOD I-I in ADL, assist for IADLs from sister (she drives him)    OT Problem List: Decreased activity tolerance;Impaired balance (sitting and/or standing);Decreased knowledge of use of DME or AE;Decreased safety awareness;Decreased strength   OT Treatment/Interventions: Self-care/ADL training;Therapeutic exercise;Energy conservation;DME and/or AE instruction;Patient/family education;Therapeutic activities;Balance training      OT Goals(Current goals can be found in the care plan section)   Acute Rehab OT Goals Patient Stated Goal: go home OT Goal Formulation: With patient Time For Goal Achievement: 12/06/24 Potential to Achieve Goals: Good ADL Goals Pt Will Perform Grooming: with modified  independence;sitting;standing Pt Will Perform Lower Body Dressing: with modified independence;sit to/from stand;sitting/lateral leans Pt Will Transfer to Toilet: with modified independence;ambulating Pt Will Perform Toileting - Clothing Manipulation and hygiene: with modified independence;sitting/lateral leans;sit to/from stand   OT Frequency:  Min 2X/week    Co-evaluation PT/OT/SLP Co-Evaluation/Treatment: Yes Reason for Co-Treatment: To address functional/ADL transfers   OT goals addressed during session: ADL's and self-care      AM-PAC OT 6 Clicks Daily Activity     Outcome Measure Help from another person eating meals?: None Help from another person taking care of personal grooming?: None Help from another person toileting, which includes using toliet, bedpan, or urinal?: A Little Help from another person bathing (including washing, rinsing, drying)?: A Little Help from another person to put on and taking off regular upper body clothing?: None Help from another person to put on and taking off regular lower body clothing?: A Little 6 Click Score: 21   End of Session Nurse Communication: Mobility status  Activity Tolerance: Patient tolerated treatment well Patient left: in chair;with call bell/phone within reach;with chair alarm set  OT Visit Diagnosis: Other abnormalities of gait and mobility (R26.89)  Time: 9076-9060 OT Time Calculation (min): 16 min Charges:  OT General Charges $OT Visit: 1 Visit OT Evaluation $OT Eval Moderate Complexity: 1 Mod  Therisa Sheffield, OTD OTR/L  11/22/2024, 9:54 AM

## 2024-11-22 NOTE — Evaluation (Signed)
 Physical Therapy Evaluation Patient Details Name: Jordan Arellano MRN: [id number] DOB: November 22, 1951 Today's Date: 11/22/2024  History of Present Illness  Pt is a 73 year old male Right common femoral endarterectomy and patch angioplasty, Right iliac thromboembolectomy with 4 Fogarty embolectomy balloon,  Separate and distinct right profunda femoris artery endarterectomy and separate patch angioplasty,  Placement of antibiotic impregnated beads, Redo femoral exposure after previous right femoral to popliteal bypass on 11/21/24   Clinical Impression  Patient received in bed, he reports significant pain 7/10 at rest 9/10 with mobility. He is cga for bed mobility, transfers to recliner via squat pivot/lateral scoot with min A. Patient did not want to attempt putting prosthetic on at this time due to pain in leg. He will continue to benefit from skilled PT to improve functional mobility, independence and safety.        If plan is discharge home, recommend the following: A lot of help with walking and/or transfers;A little help with bathing/dressing/bathroom   Can travel by private vehicle    yes    Equipment Recommendations None recommended by PT  Recommendations for Other Services       Functional Status Assessment Patient has had a recent decline in their functional status and demonstrates the ability to make significant improvements in function in a reasonable and predictable amount of time.     Precautions / Restrictions Precautions Precautions: Fall Recall of Precautions/Restrictions: Intact Precaution/Restrictions Comments: old R BKA Restrictions Weight Bearing Restrictions Per Provider Order: No      Mobility  Bed Mobility Overal bed mobility: Modified Independent Bed Mobility: Supine to Sit     Supine to sit: Supervision, HOB elevated     General bed mobility comments: intermittent vcs for technique    Transfers Overall transfer level: Needs assistance   Transfers:  Bed to chair/wheelchair/BSC       Squat pivot transfers: Contact guard assist, Min assist    Lateral/Scoot Transfers: Contact guard assist, Min assist      Ambulation/Gait                  Stairs            Wheelchair Mobility     Tilt Bed    Modified Rankin (Stroke Patients Only)       Balance                                             Pertinent Vitals/Pain Pain Assessment Pain Assessment: 0-10 Pain Score: 9  Pain Location: RLE Pain Descriptors / Indicators: Burning Pain Intervention(s): Monitored during session, Repositioned    Home Living Family/patient expects to be discharged to:: Private residence Living Arrangements: Alone Available Help at Discharge: Family;Available PRN/intermittently Type of Home: Mobile home Home Access: Ramped entrance       Home Layout: One level Home Equipment: Agricultural Consultant (2 wheels);Cane - single point;Wheelchair - manual Additional Comments: R prosthetic for BKA    Prior Function Prior Level of Function : Needs assist       Physical Assist : Mobility (physical);ADLs (physical)     Mobility Comments: MOD I with SPC and R prosthetic ADLs Comments: pt reports generally MOD I-I in ADL, assist for IADLs from sister (she drives him)     Extremity/Trunk Assessment   Upper Extremity Assessment Upper Extremity Assessment: Overall WFL for tasks assessed    Lower  Extremity Assessment Lower Extremity Assessment: RLE deficits/detail RLE Deficits / Details: old R BKA RLE: Unable to fully assess due to pain    Cervical / Trunk Assessment Cervical / Trunk Assessment: Normal  Communication   Communication Communication: Impaired Factors Affecting Communication: Hearing impaired    Cognition Arousal: Alert Behavior During Therapy: WFL for tasks assessed/performed   PT - Cognitive impairments: No apparent impairments                         Following commands: Intact        Cueing Cueing Techniques: Verbal cues     General Comments General comments (skin integrity, edema, etc.): vss throughout, dressing appears the same pre/post session    Exercises     Assessment/Plan    PT Assessment Patient needs continued PT services  PT Problem List Decreased mobility;Decreased balance;Decreased activity tolerance;Decreased strength;Pain       PT Treatment Interventions DME instruction;Gait training;Functional mobility training;Therapeutic activities;Therapeutic exercise;Balance training;Patient/family education;Neuromuscular re-education    PT Goals (Current goals can be found in the Care Plan section)  Acute Rehab PT Goals Patient Stated Goal: improve, return home PT Goal Formulation: With patient Time For Goal Achievement: 11/29/24 Potential to Achieve Goals: Good    Frequency Min 2X/week     Co-evaluation PT/OT/SLP Co-Evaluation/Treatment: Yes Reason for Co-Treatment: For patient/therapist safety;To address functional/ADL transfers PT goals addressed during session: Mobility/safety with mobility OT goals addressed during session: ADL's and self-care       AM-PAC PT 6 Clicks Mobility  Outcome Measure Help needed turning from your back to your side while in a flat bed without using bedrails?: A Little Help needed moving from lying on your back to sitting on the side of a flat bed without using bedrails?: A Little Help needed moving to and from a bed to a chair (including a wheelchair)?: A Little Help needed standing up from a chair using your arms (e.g., wheelchair or bedside chair)?: A Lot Help needed to walk in hospital room?: A Lot Help needed climbing 3-5 steps with a railing? : A Lot 6 Click Score: 15    End of Session   Activity Tolerance: Patient tolerated treatment well;Patient limited by pain Patient left: in chair;with call bell/phone within reach;with chair alarm set Nurse Communication: Mobility status PT Visit Diagnosis:  Other abnormalities of gait and mobility (R26.89);Muscle weakness (generalized) (M62.81);Difficulty in walking, not elsewhere classified (R26.2);Pain Pain - Right/Left: Right Pain - part of body: Leg    Time: 9076-9061 PT Time Calculation (min) (ACUTE ONLY): 15 min   Charges:   PT Evaluation $PT Eval Low Complexity: 1 Low   PT General Charges $$ ACUTE PT VISIT: 1 Visit         Nathanyal Ashmead, PT, GCS 11/22/2024,10:59 AM

## 2024-11-22 NOTE — Progress Notes (Signed)
°  Progress Note    11/22/2024 8:47 AM 1 Day Post-Op  Subjective:  Jordan Arellano is a 73 yo male now POD #1 from:  PROCEDURE: Right common femoral endarterectomy with bovine pericardial patch angioplasty. Separate and distinct right profunda femoris endarterectomy with bovine pericardial patch angioplasty. Fogarty thromboembolectomy of the right common iliac and external iliac arteries. 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   ASSISTANT(S): None   ANESTHESIA: general   ESTIMATED BLOOD LOSS: 450 cc  Patient is resting comfortably in bed this morning. He endorses there is some pain to his right groin but very tolerable. It is more sore than anything. No complaints overnight and vitals all remain stable.    Vitals:   11/22/24 0600 11/22/24 0611  BP: 128/77 128/77  Pulse: (!) 58   Resp: 16   Temp:    SpO2: 99%    Physical Exam: Cardiac:  RRR, Normal S1, S2. No Murmurs  Lungs:  Non labored breathing, clear throughout on auscultation. No rales Rhonchi or wheezing to note.  Incisions:  Right groin with dressing clean dry and intact. No hematoma, seroma to note.  Extremities:  Right BKA, Leg is warm on auscultation. Left lower extremity is warm to touch with palpable pulses.  Abdomen:  Positive bowel sound on auscultation throughout, soft non tender and non distended.  Neurologic: AAOX3, answers all questions and follows commands appropriately.   CBC    Component Value Date/Time   WBC 10.2 11/22/2024 0532   RBC 3.09 (L) 11/22/2024 0532   HGB 9.2 (L) 11/22/2024 0532   HCT 29.0 (L) 11/22/2024 0532   PLT 144 (L) 11/22/2024 0532   MCV 93.9 11/22/2024 0532   MCH 29.8 11/22/2024 0532   MCHC 31.7 11/22/2024 0532   RDW 15.1 11/22/2024 0532    LYMPHSABS 3.5 11/14/2024 1503   MONOABS 0.6 11/14/2024 1503   EOSABS 0.2 11/14/2024 1503   BASOSABS 0.1 11/14/2024 1503    BMET    Component Value Date/Time   NA 137 11/22/2024 0532   NA 138 10/18/2024 1420   K 4.0 11/22/2024 0532   CL 104 11/22/2024 0532   CO2 23 11/22/2024 0532   GLUCOSE 123 (H) 11/22/2024 0532   BUN 11 11/22/2024 0532   BUN 29 (H) 10/18/2024 1420   CREATININE 1.05 11/22/2024 0532   CALCIUM  8.2 (L) 11/22/2024 0532   GFRNONAA >60 11/22/2024 0532   GFRAA >60 10/02/2017 0943    INR    Component Value Date/Time   INR 1.1 05/18/2023 1521     Intake/Output Summary (Last 24 hours) at 11/22/2024 0847 Last data filed at 11/22/2024 0600 Gross per 24 hour  Intake 2738.19 ml  Output 1850 ml  Net 888.19 ml     Assessment/Plan:  73 y.o. male is s/p SEE ABOVE  1 Day Post-Op   PLAN Pain medication PRN OOB to chair with assist  Advance diet as tolerated Okay to use prosthetic for ambulation Okay to transfer to the floor today.   DVT prophylaxis:  ASA 81 mg Daily and Plavix  75 mg daily   Gwendlyn JONELLE [facility]ank [facility] 11/22/2024 8:47 AM

## 2024-11-23 LAB — GLUCOSE, CAPILLARY
Glucose-Capillary: 108 mg/dL — ABNORMAL HIGH (ref 70–99)
Glucose-Capillary: 117 mg/dL — ABNORMAL HIGH (ref 70–99)
Glucose-Capillary: 140 mg/dL — ABNORMAL HIGH (ref 70–99)
Glucose-Capillary: 122 mg/dL — ABNORMAL HIGH (ref 70–99)

## 2024-11-23 LAB — BASIC METABOLIC PANEL WITH GFR
Anion gap: 12 (ref 5–15)
BUN: 15 mg/dL (ref 8–23)
CO2: 24 mmol/L (ref 22–32)
Calcium: 8.5 mg/dL — ABNORMAL LOW (ref 8.9–10.3)
Chloride: 99 mmol/L (ref 98–111)
Creatinine, Ser: 1.11 mg/dL (ref 0.61–1.24)
GFR, Estimated: >60 mL/min
Glucose, Bld: 119 mg/dL — ABNORMAL HIGH (ref 70–99)
Potassium: 4.2 mmol/L (ref 3.5–5.1)
Sodium: 135 mmol/L (ref 135–145)

## 2024-11-23 LAB — CBC
HCT: 34.2 % — ABNORMAL LOW (ref 39.0–52.0)
Hemoglobin: 11.2 g/dL — ABNORMAL LOW (ref 13.0–17.0)
MCH: 30.2 pg (ref 26.0–34.0)
MCHC: 32.7 g/dL (ref 30.0–36.0)
MCV: 92.2 fL (ref 80.0–100.0)
Platelets: 157 K/uL (ref 150–400)
RBC: 3.71 MIL/uL — ABNORMAL LOW (ref 4.22–5.81)
RDW: 15.2 % (ref 11.5–15.5)
WBC: 9.7 K/uL (ref 4.0–10.5)
nRBC: 0 % (ref 0.0–0.2)

## 2024-11-23 LAB — SURGICAL PATHOLOGY

## 2024-11-23 NOTE — Plan of Care (Signed)

## 2024-11-23 NOTE — TOC Initial Note (Addendum)
 Transition of Care [facility]) - Initial/Assessment Note    Patient Details  Name: Jordan Arellano MRN: [id number] Date of Birth: 1951-03-13  [facility] CM/SW Contact:    [name], RN Phone Number: 11/23/2024, 2:29 PM  Clinical Narrative:                   Admitted for: s/p endarterectomy  Admitted from: home alone PCP: [facility] Current home health/prior home health/DME: RW, cane, WC, prosthetic   Met with patient, brother in law, sister Jenkins was on Speaker phone Patient gave permission for them to be present for the conversation  Per Jenkins patient's house is infested with roaches so they have arranged for the house to be fumigated, and are going to have it deep cleaned.    In the meantime patient will be staying with with sister and brother in law at [address] [place] [address]  Therapy recommending HH.  Patient in agreement.  Sister states her top 2 preferences are [facility] in home or [facility]. Spoke with Melissa at [facility] and they are in network with patient's insurance and request referral be faxed to [phone].  Referral fax for review.    When patient returns to his home sister states that she takes him too appointments and brings him groceries to his home    330 pm - spoke with Raya at [facility] home [facility] [phone].  She states they can accept the referral and request dc summary be faxed to [phone].  Catherin states that she is on call this weekend if anything is needed      Patient Goals and CMS Choice            Expected Discharge Plan and Services                                              Prior Living Arrangements/Services                       Activities of Daily Living   ADL Screening (condition at time of admission) Independently performs ADLs?: Yes (appropriate for developmental age) Is the patient deaf or have difficulty hearing?: No Does the patient have  difficulty seeing, even when wearing glasses/contacts?: No Does the patient have difficulty concentrating, remembering, or making decisions?: No  Permission Sought/Granted                  Emotional Assessment              Admission diagnosis:  Atherosclerosis of native artery of extremity with rest pain, unspecified extremity (HCC) [I70.229] Atherosclerosis of artery of extremity with rest pain [facility]) [I70.229] Patient Active Problem List   Diagnosis Date Noted   Atherosclerosis of artery of extremity with rest pain (HCC) 11/21/2024   Microhematuria 09/27/2024   CKD stage 2 due to type 2 diabetes mellitus (HCC) 07/11/2024   Microalbuminuria due to type 2 diabetes mellitus (HCC) 07/11/2024   Acute cystitis with hematuria 06/29/2024   Atherosclerosis of native coronary artery of native heart without angina pectoris 06/28/2024   Bilateral nephrolithiasis 06/28/2024   Calculus of gallbladder without cholecystitis without obstruction 06/28/2024   Anemia of chronic disease 09/01/2023   Vitamin D  deficiency 09/01/2023   Heart failure with preserved ejection fraction (HCC) 05/18/2023   At risk  for constipation 05/18/2023   Mild cognitive impairment 04/25/2023   Malnutrition of mild degree 04/18/2023   Type II diabetes mellitus with complication (HCC) 03/27/2023   Conductive hearing loss of right ear 08/19/2022   Left renal artery stenosis 06/27/2020   Major depressive disorder in remission 06/27/2020   Atherosclerosis of native arteries of extremity with rest pain (HCC) 10/01/2017   Status post femoral-popliteal bypass surgery 09/30/2017   DM (diabetes mellitus) with peripheral vascular complication (HCC) 12/19/2016   Dyslipidemia 10/01/2016   Status post below-knee amputation of right lower extremity (HCC) 09/30/2016   Neuropathy 09/30/2016   PAD s/p right BKA, history revascularization left leg 09/30/2016   Phantom pain after amputation of lower extremity (HCC) 09/30/2016    History of CVA (cerebrovascular accident) without residual deficits 03/25/2014   Essential hypertension 03/25/2014   PCP:  Abbey Bruckner, MD Pharmacy:   [facility]) - [place], [place] - [address] [facility] [address] [place] [address] [place] [address] Phone: [phone] Fax: [phone]  [id number] [place], [place] - [address] AT [facility] [address] [place] [address] Phone: [phone] Fax: [phone]  [facility] - [place], [place] - [address] [place]  [address] Phone: [phone] Fax: [phone]     Social Drivers of Health (SDOH) Social History: SDOH Screenings   Food Insecurity: No Food Insecurity (11/21/2024)  Housing: Low Risk (11/21/2024)  Transportation Needs: No Transportation Needs (11/21/2024)  Utilities: Not At Risk (11/21/2024)  Alcohol Screen: Low Risk (08/18/2023)  Depression (PHQ2-9): Medium Risk (06/28/2024)  Financial Resource Strain: Low Risk  (10/03/2024)   Received from [facility]  Physical Activity: Insufficiently Active (08/18/2023)  Social Connections: Socially Isolated (11/21/2024)  Stress: No Stress Concern Present (08/18/2023)  Tobacco Use: High Risk (11/21/2024)  Health Literacy: Adequate Health Literacy (08/18/2023)   SDOH Interventions:     Readmission Risk Interventions     No data to display

## 2024-11-23 NOTE — Progress Notes (Signed)
 Physical Therapy Treatment Patient Details Name: Jordan Arellano MRN: [id number] DOB: Oct 16, 1951 Today's Date: 11/23/2024   History of Present Illness Pt is a 73 year old male Right common femoral endarterectomy and patch angioplasty, Right iliac thromboembolectomy with 4 Fogarty embolectomy balloon,  Separate and distinct right profunda femoris artery endarterectomy and separate patch angioplasty,  Placement of antibiotic impregnated beads, Redo femoral exposure after previous right femoral to popliteal bypass on 11/21/24    PT Comments  Patient received in bed, states he is feeling a bit better agreeable to PT session. Patient is mod I with bed mobility. Transfers with cga. Ambulated a few feet with RW from bed to recliner supervision. Good balance demonstrated. Patient will continue to benefit from skilled PT to improve functional independence.     If plan is discharge home, recommend the following: A little help with walking and/or transfers;Help with stairs or ramp for entrance;Assist for transportation   Can travel by private vehicle      yes  Equipment Recommendations  None recommended by PT    Recommendations for Other Services       Precautions / Restrictions Precautions Precautions: Fall Recall of Precautions/Restrictions: Intact Precaution/Restrictions Comments: old R BKA Restrictions Weight Bearing Restrictions Per Provider Order: No     Mobility  Bed Mobility Overal bed mobility: Modified Independent Bed Mobility: Supine to Sit     Supine to sit: Modified independent (Device/Increase time), HOB elevated          Transfers Overall transfer level: Needs assistance Equipment used: Rolling walker (2 wheels) Transfers: Sit to/from Stand, Bed to chair/wheelchair/BSC Sit to Stand: Contact guard assist   Step pivot transfers: Contact guard assist, Supervision       General transfer comment: patient able to take a few steps with RW from bed to recliner.  Demonstrates good balance. Did not want to attempt to put on prosthetic yet.    Ambulation/Gait Ambulation/Gait assistance: Supervision Gait Distance (Feet): 3 Feet Assistive device: Rolling walker (2 wheels) Gait Pattern/deviations: Step-to pattern Gait velocity: decr     General Gait Details: mobility is good despite not wearing prosthetic   Stairs             Wheelchair Mobility     Tilt Bed    Modified Rankin (Stroke Patients Only)       Balance Overall balance assessment: Modified Independent Sitting-balance support: Feet supported Sitting balance-Leahy Scale: Normal     Standing balance support: Bilateral upper extremity supported, During functional activity, Reliant on assistive device for balance Standing balance-Leahy Scale: Good                              Communication Communication Communication: Impaired Factors Affecting Communication: Hearing impaired  Cognition Arousal: Alert Behavior During Therapy: Flat affect   PT - Cognitive impairments: No apparent impairments                         Following commands: Intact      Cueing Cueing Techniques: Verbal cues  Exercises      General Comments        Pertinent Vitals/Pain Pain Assessment Pain Assessment: Faces Faces Pain Scale: Hurts a little bit Pain Location: RLE Pain Descriptors / Indicators: Discomfort, Sore Pain Intervention(s): Monitored during session, Repositioned    Home Living  Prior Function            PT Goals (current goals can now be found in the care plan section) Acute Rehab PT Goals Patient Stated Goal: improve, return home PT Goal Formulation: With patient Time For Goal Achievement: 11/29/24 Potential to Achieve Goals: Good Progress towards PT goals: Progressing toward goals    Frequency    Min 2X/week      PT Plan      Co-evaluation              AM-PAC PT 6 Clicks Mobility    Outcome Measure  Help needed turning from your back to your side while in a flat bed without using bedrails?: None Help needed moving from lying on your back to sitting on the side of a flat bed without using bedrails?: None Help needed moving to and from a bed to a chair (including a wheelchair)?: A Little Help needed standing up from a chair using your arms (e.g., wheelchair or bedside chair)?: A Little Help needed to walk in hospital room?: A Little Help needed climbing 3-5 steps with a railing? : A Lot 6 Click Score: 19    End of Session   Activity Tolerance: Patient tolerated treatment well Patient left: in chair;with call bell/phone within reach;with chair alarm set Nurse Communication: Mobility status PT Visit Diagnosis: Pain;Other abnormalities of gait and mobility (R26.89);Difficulty in walking, not elsewhere classified (R26.2) Pain - Right/Left: Right Pain - part of body: Leg     Time: 1040-1056 PT Time Calculation (min) (ACUTE ONLY): 16 min  Charges:    $Therapeutic Activity: 8-22 mins PT General Charges $$ ACUTE PT VISIT: 1 Visit                     Uilani Sanville, PT, GCS 11/23/2024,11:20 AM

## 2024-11-23 NOTE — Progress Notes (Signed)
 Order given by Dr. Dreama to d/c tele

## 2024-11-23 NOTE — Care Management Important Message (Signed)
 Important Message  Patient Details  Name: Antoinette Haskett MRN: [id number] Date of Birth: [birth year]   Important Message Given:  Yes - Medicare IM     Rojelio SHAUNNA Rattler 11/23/2024, 4:33 PM

## 2024-11-24 DIAGNOSIS — Z89511 Acquired absence of right leg below knee: Secondary | ICD-10-CM

## 2024-11-24 DIAGNOSIS — Z9889 Other specified postprocedural states: Secondary | ICD-10-CM

## 2024-11-24 LAB — CBC
HCT: 32.6 % — ABNORMAL LOW (ref 39.0–52.0)
Hemoglobin: 10.6 g/dL — ABNORMAL LOW (ref 13.0–17.0)
MCH: 29.9 pg (ref 26.0–34.0)
MCHC: 32.5 g/dL (ref 30.0–36.0)
MCV: 91.8 fL (ref 80.0–100.0)
Platelets: 149 K/uL — ABNORMAL LOW (ref 150–400)
RBC: 3.55 MIL/uL — ABNORMAL LOW (ref 4.22–5.81)
RDW: 15.1 % (ref 11.5–15.5)
WBC: 8.8 K/uL (ref 4.0–10.5)
nRBC: 0 % (ref 0.0–0.2)

## 2024-11-24 LAB — BASIC METABOLIC PANEL WITH GFR
Anion gap: 12 (ref 5–15)
BUN: 19 mg/dL (ref 8–23)
CO2: 24 mmol/L (ref 22–32)
Calcium: 8.7 mg/dL — ABNORMAL LOW (ref 8.9–10.3)
Chloride: 100 mmol/L (ref 98–111)
Creatinine, Ser: 1.09 mg/dL (ref 0.61–1.24)
GFR, Estimated: >60 mL/min
Glucose, Bld: 118 mg/dL — ABNORMAL HIGH (ref 70–99)
Potassium: 3.7 mmol/L (ref 3.5–5.1)
Sodium: 135 mmol/L (ref 135–145)

## 2024-11-24 LAB — GLUCOSE, CAPILLARY
Glucose-Capillary: 132 mg/dL — ABNORMAL HIGH (ref 70–99)
Glucose-Capillary: 108 mg/dL — ABNORMAL HIGH (ref 70–99)
Glucose-Capillary: 116 mg/dL — ABNORMAL HIGH (ref 70–99)
Glucose-Capillary: 110 mg/dL — ABNORMAL HIGH (ref 70–99)
Glucose-Capillary: 152 mg/dL — ABNORMAL HIGH (ref 70–99)

## 2024-11-24 MED ORDER — POLYETHYLENE GLYCOL 3350 17 G PO PACK: 17.0000 g | PACK | Freq: Every day | ORAL | Status: DC | PRN

## 2024-11-24 NOTE — Progress Notes (Signed)
 3 Days Post-Op   Subjective/Chief Complaint: Doing well. Denies pain. Tolerating OOB with PT   Objective: Vital signs in last 24 hours: Temp:  [98.3 F (36.8 C)-99.2 F (37.3 C)] 98.3 F (36.8 C) (12/20 0758) Pulse Rate:  [76-79] 76 (12/20 0758) Resp:  [15-20] 15 (12/20 0758) BP: (125-141)/(70-79) 133/76 (12/20 0758) SpO2:  [96 %-97 %] 97 % (12/20 0758) Last BM Date : 11/21/24  Intake/Output from previous day: 12/19 0701 - 12/20 0700 In: -  Out: 850 [Urine:850] Intake/Output this shift: No intake/output data recorded.  General appearance: alert and no distress Cardio: regular rate and rhythm Extremities: Right groin incision- C/D/I, soft, BKA stump warm  Lab Results:  Recent Labs    11/23/24 0437 11/24/24 0409  WBC 9.7 8.8  HGB 11.2* 10.6*  HCT 34.2* 32.6*  PLT 157 149*   BMET Recent Labs    11/23/24 0437 11/24/24 0409  NA 135 135  K 4.2 3.7  CL 99 100  CO2 24 24  GLUCOSE 119* 118*  BUN 15 19  CREATININE 1.11 1.09  CALCIUM  8.5* 8.7*   PT/INR No results for input(s): LABPROT, INR in the last 72 hours. ABG No results for input(s): PHART, HCO3 in the last 72 hours.  Invalid input(s): PCO2, PO2  Studies/Results: No results found.  Anti-infectives: Anti-infectives (From admission, onward)    Start     Dose/Rate Route Frequency Ordered Stop   11/21/24 1900  ceFAZolin  (ANCEF ) IVPB 2g/100 mL premix        2 g 200 mL/hr over 30 Minutes Intravenous Every 8 hours 11/21/24 1554 11/22/24 0337   11/21/24 1341  vancomycin  (VANCOCIN ) powder  Status:  Discontinued          As needed 11/21/24 1341 11/21/24 1421   11/21/24 1340  gentamicin  (GARAMYCIN ) injection  Status:  Discontinued          As needed 11/21/24 1341 11/21/24 1421   11/21/24 0600  ceFAZolin  (ANCEF ) IVPB 2g/100 mL premix        2 g 200 mL/hr over 30 Minutes Intravenous On call to O.R. 11/20/24 2225 11/21/24 1110       Assessment/Plan: s/p Procedures: ENDARTERECTOMY, FEMORAL  (Right) APPLICATION OF CELL SAVER (N/A) THROMBECTOMY, ARTERY, FEMORAL (Right) Continue PT OOB - needs a few more sessions prior to discharge- plan for Monday ASA/Plavix   LOS: 3 days    Tisa Dakin A 11/24/2024

## 2024-11-25 LAB — GLUCOSE, CAPILLARY
Glucose-Capillary: 154 mg/dL — ABNORMAL HIGH (ref 70–99)
Glucose-Capillary: 125 mg/dL — ABNORMAL HIGH (ref 70–99)
Glucose-Capillary: 154 mg/dL — ABNORMAL HIGH (ref 70–99)
Glucose-Capillary: 133 mg/dL — ABNORMAL HIGH (ref 70–99)

## 2024-11-25 NOTE — TOC Progression Note (Signed)
 Transition of Care [facility]) - Progression Note    Patient Details  Name: Jordan Arellano MRN: [id number] Date of Birth: [birth year]  [facility] CM/SW Contact  Victory Jackquline RAMAN, RN Phone Number: 11/25/2024, 12:42 PM  Clinical Narrative:    RNCM received a message via secure chat from the Physical Therapist informing me that the patient will need wheelchair for home. RNCM called patient @ [phone], introduced myself and my role and explained that discharge planning would be discussed. PT is recommending a wheelchair. Patient in agreement with the wheelchair and states that he doesn't have one. RNCM sent and email to [employer] for wheelchair to be delivered to bedside. RNCM will continue to follow for discharge planning needs.                    Expected Discharge Plan and Services                                               Social Drivers of Health (SDOH) Interventions SDOH Screenings   Food Insecurity: No Food Insecurity (11/21/2024)  Housing: Low Risk (11/21/2024)  Transportation Needs: No Transportation Needs (11/21/2024)  Utilities: Not At Risk (11/21/2024)  Alcohol Screen: Low Risk (08/18/2023)  Depression (PHQ2-9): Medium Risk (06/28/2024)  Financial Resource Strain: Low Risk  (10/03/2024)   Received from [facility]  Physical Activity: Insufficiently Active (08/18/2023)  Social Connections: Socially Isolated (11/21/2024)  Stress: No Stress Concern Present (08/18/2023)  Tobacco Use: High Risk (11/21/2024)  Health Literacy: Adequate Health Literacy (08/18/2023)    Readmission Risk Interventions     No data to display

## 2024-11-25 NOTE — Plan of Care (Signed)
" °  Problem: Education: Goal: Knowledge of General Education information will improve Description: Including pain rating scale, medication(s)/side effects and non-pharmacologic comfort measures Outcome: Progressing   Problem: Clinical Measurements: Goal: Ability to maintain clinical measurements within normal limits will improve Outcome: Progressing   Problem: Clinical Measurements: Goal: Respiratory complications will improve Outcome: Progressing   Problem: Clinical Measurements: Goal: Cardiovascular complication will be avoided Outcome: Progressing   Problem: Clinical Measurements: Goal: Diagnostic test results will improve Outcome: Progressing   Problem: Coping: Goal: Ability to adjust to condition or change in health will improve Outcome: Progressing   "

## 2024-11-25 NOTE — Progress Notes (Signed)
 4 Days Post-Op   Subjective/Chief Complaint: Feeling better. Pain controlled with current regimen. States he still feels unsteady OOB. Has not tried prosthesis,   Objective: Vital signs in last 24 hours: Temp:  [98.2 F (36.8 C)-98.5 F (36.9 C)] 98.4 F (36.9 C) (12/21 0525) Pulse Rate:  [74-78] 74 (12/21 0525) Resp:  [14-18] 18 (12/21 0525) BP: (124-133)/(73-78) 126/78 (12/21 0620) SpO2:  [96 %-97 %] 97 % (12/21 0525) Last BM Date : 11/21/24  Intake/Output from previous day: 12/20 0701 - 12/21 0700 In: 180 [P.O.:180] Out: 1275 [Urine:1275] Intake/Output this shift: No intake/output data recorded.  General appearance: alert and no distress Cardio: regular rate and rhythm Extremities: RIGHT groin incision- C/D/I, soft, BKA stump warm  Lab Results:  Recent Labs    11/23/24 0437 11/24/24 0409  WBC 9.7 8.8  HGB 11.2* 10.6*  HCT 34.2* 32.6*  PLT 157 149*   BMET Recent Labs    11/23/24 0437 11/24/24 0409  NA 135 135  K 4.2 3.7  CL 99 100  CO2 24 24  GLUCOSE 119* 118*  BUN 15 19  CREATININE 1.11 1.09  CALCIUM  8.5* 8.7*   PT/INR No results for input(s): LABPROT, INR in the last 72 hours. ABG No results for input(s): PHART, HCO3 in the last 72 hours.  Invalid input(s): PCO2, PO2  Studies/Results: No results found.  Anti-infectives: Anti-infectives (From admission, onward)    Start     Dose/Rate Route Frequency Ordered Stop   11/21/24 1900  ceFAZolin  (ANCEF ) IVPB 2g/100 mL premix        2 g 200 mL/hr over 30 Minutes Intravenous Every 8 hours 11/21/24 1554 11/22/24 0337   11/21/24 1341  vancomycin  (VANCOCIN ) powder  Status:  Discontinued          As needed 11/21/24 1341 11/21/24 1421   11/21/24 1340  gentamicin  (GARAMYCIN ) injection  Status:  Discontinued          As needed 11/21/24 1341 11/21/24 1421   11/21/24 0600  ceFAZolin  (ANCEF ) IVPB 2g/100 mL premix        2 g 200 mL/hr over 30 Minutes Intravenous On call to O.R. 11/20/24 2225  11/21/24 1110       Assessment/Plan: s/p Procedures: ENDARTERECTOMY, FEMORAL (Right) APPLICATION OF CELL SAVER (N/A) THROMBECTOMY, ARTERY, FEMORAL (Right)  POD #4 Continue OOB with PT ASA/Plavix  Possible home tomorrow  LOS: 4 days    Tisa Dakin A 11/25/2024

## 2024-11-25 NOTE — Progress Notes (Addendum)
 Physical Therapy Treatment Patient Details Name: Jordan Arellano MRN: [id number] DOB: [birth year] Today's Date: 11/25/2024   History of Present Illness Pt is a 73 year old male Right common femoral endarterectomy and patch angioplasty, Right iliac thromboembolectomy with 4 Fogarty embolectomy balloon,  Separate and distinct right profunda femoris artery endarterectomy and separate patch angioplasty,  Placement of antibiotic impregnated beads, Redo femoral exposure after previous right femoral to popliteal bypass on 11/21/24    PT Comments  Pt agreeable to session.  He is independent in bed mobility.  Upon sitting and pulling up pant leg a small scabbed area about the size of pencil eraser is noted along stump healed incision no redness noted around area.  Pt stated it has been there a while and has recently received a new prosthesis and area continues to bother him.  He does seem to be a somewhat poor historian and with flat affect makes it difficult to get clear picture.  Vascular notes state ok to walk with prosthesis.  He is able to don it on his own.  Does struggle to stand from low bed height given tall stature but once up he is feels comfortable with gait with RW and is able to walk to/from bathroom with RW and cga x 1.  Stated he does feel a bit weak but comfortable with gait but does have some pain at scabbed area.  He is limited by clinical research associate and when he sits in recliner to remove prosthesis area is reddened.  Pt stated he does not have a wheelchair at home as he uses [facility] primarily and has for years.  Given wound at end of leg, pain with gait and increased redness and vascular status, will reach out to MD team. Pt will need wheelchair for mobility at home if mobility needs to be limited to promote healing or prevent further progression of area.  Pt stated he would feel comfortable being wheelchair level in order to return home.  Per chart he will discharge to sisters home.  Will plan to see pt again  tomorrow to follow up with team decision.   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 A cane, walker, crutch will not resolve the patient's issue with performing activities of daily living. A lightweight wheelchair and cushion is required/recommended and will allow patient to safely perform daily activities.   Patient can safely propel the wheelchair in the home or has a caregiver who can provide assistance.    Addendum: After discussion with Miechia Esco, Will transition to wheelchair level to help preserve R stump and promote healing.  Pt will need wheelchair for home.  Updated DME list and will notify TOC as tentative discharge is possible tomorrow.   If plan is discharge home, recommend the following: A little help with walking and/or transfers;Help with stairs or ramp for entrance;Assist for transportation   Can travel by private vehicle        Equipment Recommendations  Wheelchair (measurements PT);Wheelchair cushion (measurements PT)    Recommendations for Other Services       Precautions / Restrictions Precautions Precautions: Fall Recall of Precautions/Restrictions: Intact Precaution/Restrictions Comments: old R BKA Restrictions Weight Bearing Restrictions Per Provider Order: No     Mobility  Bed Mobility Overal bed mobility: Modified Independent               Patient Response: Cooperative  Transfers Overall transfer level: Needs assistance  Equipment used: Rolling walker (2 wheels) Transfers: Sit to/from Stand Sit to Stand: Contact guard assist                Ambulation/Gait Ambulation/Gait assistance: Supervision, Contact guard assist Gait Distance (Feet): 12 Feet Assistive device: Rolling walker (2 wheels) Gait Pattern/deviations: Step-through pattern, Decreased step length - right, Decreased step length - left, Trunk flexed Gait velocity:  decr     General Gait Details: used walker for support   Stairs             Wheelchair Mobility     Tilt Bed Tilt Bed Patient Response: Cooperative  Modified Rankin (Stroke Patients Only)       Balance Overall balance assessment: Needs assistance, Mild deficits observed, not formally tested Sitting-balance support: Feet supported Sitting balance-Leahy Scale: Normal     Standing balance support: Bilateral upper extremity supported, During functional activity, Reliant on assistive device for balance Standing balance-Leahy Scale: Good Standing balance comment: with prosthetic                            Communication Communication Communication: Impaired Factors Affecting Communication: Hearing impaired  Cognition Arousal: Alert Behavior During Therapy: Flat affect   PT - Cognitive impairments: No apparent impairments                         Following commands: Intact      Cueing Cueing Techniques: Verbal cues  Exercises      General Comments        Pertinent Vitals/Pain Pain Assessment Pain Assessment: Faces Faces Pain Scale: Hurts little more Pain Location: stump scabbed area after gait to/from bathroom. Pain Descriptors / Indicators: Discomfort, Sore Pain Intervention(s): Limited activity within patient's tolerance, Monitored during session, Repositioned    Home Living                          Prior Function            PT Goals (current goals can now be found in the care plan section) Progress towards PT goals: Progressing toward goals    Frequency    Min 2X/week      PT Plan      Co-evaluation              AM-PAC PT 6 Clicks Mobility   Outcome Measure  Help needed turning from your back to your side while in a flat bed without using bedrails?: None Help needed moving from lying on your back to sitting on the side of a flat bed without using bedrails?: None Help needed moving to and from  a bed to a chair (including a wheelchair)?: A Little Help needed standing up from a chair using your arms (e.g., wheelchair or bedside chair)?: A Little Help needed to walk in hospital room?: A Little Help needed climbing 3-5 steps with a railing? : A Little 6 Click Score: 20    End of Session Equipment Utilized During Treatment: Gait belt;Other (comment) (prosthesis)   Patient left: in chair;with call bell/phone within reach;with chair alarm set Nurse Communication: Mobility status PT Visit Diagnosis: Pain;Other abnormalities of gait and mobility (R26.89);Difficulty in walking, not elsewhere classified (R26.2) Pain - Right/Left: Right Pain - part of body: Leg     Time: 1043-1100 PT Time Calculation (min) (ACUTE ONLY): 17 min  Charges:    $Gait Training: 8-22 mins  PT General Charges $$ ACUTE PT VISIT: 1 Visit                   Lauraine Gills, PTA 11/25/2024, 11:15 AM

## 2024-11-25 NOTE — TOC CM/SW Note (Signed)
" °  °  Durable Medical Equipment  (From admission, onward)           Start     Ordered   11/25/24 1435  For home use only DME standard manual wheelchair with seat cushion  Once       Comments: Patient suffers from RIGHT BKA which impairs their ability to perform daily activities like bathing and toileting in the home.  A walker will not resolve issue with performing activities of daily living. A wheelchair will allow patient to safely perform daily activities. Patient can safely propel the wheelchair in the home or has a caregiver who can provide assistance. Length of need 6 months . Accessories: elevating leg rests (ELRs), wheel locks, extensions and anti-tippers.   11/25/24 1435            "

## 2024-11-26 ENCOUNTER — Telehealth (INDEPENDENT_AMBULATORY_CARE_PROVIDER_SITE_OTHER): Payer: Self-pay

## 2024-11-26 LAB — GLUCOSE, CAPILLARY
Glucose-Capillary: 188 mg/dL — ABNORMAL HIGH (ref 70–99)
Glucose-Capillary: 134 mg/dL — ABNORMAL HIGH (ref 70–99)
Glucose-Capillary: 129 mg/dL — ABNORMAL HIGH (ref 70–99)
Glucose-Capillary: 172 mg/dL — ABNORMAL HIGH (ref 70–99)

## 2024-11-26 NOTE — Progress Notes (Signed)
 " Progress Note    11/26/2024 11:25 AM 5 Days Post-Op  Subjective:  Jordan Arellano is a 73 yo male who is now POD #5 From:  Jordan Arellano is a 73 yo male now POD #1 from:   PROCEDURE: Right common femoral endarterectomy with bovine pericardial patch angioplasty. Separate and distinct right profunda femoris endarterectomy with bovine pericardial patch angioplasty. Fogarty thromboembolectomy of the right common iliac and external iliac arteries. 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   ASSISTANT(S): None   ANESTHESIA: general   ESTIMATED BLOOD LOSS: 450 cc   Patient is resting comfortably in bed this morning. He endorses there is some pain to his right groin but very tolerable. It is more sore than anything. Patient noted to have a scab to the anterior of his stump. It measures 4 mm round. It is not open or bleeding this morning. No complaints overnight and vitals all remain stable.    Vitals:   11/26/24 0629 11/26/24 0939  BP: 107/64 118/71  Pulse:  68  Resp:  16  Temp:  98.1 F (36.7 C)  SpO2:  100%   Physical Exam: Cardiac:  RRR, Normal S1, S2. No Murmurs  Lungs:  Non labored breathing, clear throughout on auscultation. No rales Rhonchi or wheezing to note.  Incisions:  Right groin with dressing clean dry and intact. No hematoma, seroma to note.  Extremities:  Right BKA, Leg is warm on auscultation. Left lower extremity is warm to touch with palpable pulses.  Abdomen:  Positive bowel sound on auscultation throughout, soft non tender and non distended.  Neurologic: AAOX3, answers all questions and follows commands appropriately.   CBC    Component Value Date/Time   WBC 8.8 11/24/2024 0409   RBC 3.55 (L) 11/24/2024 0409   HGB 10.6  (L) 11/24/2024 0409   HCT 32.6 (L) 11/24/2024 0409   PLT 149 (L) 11/24/2024 0409   MCV 91.8 11/24/2024 0409   MCH 29.9 11/24/2024 0409   MCHC 32.5 11/24/2024 0409   RDW 15.1 11/24/2024 0409   LYMPHSABS 3.5 11/14/2024 1503   MONOABS 0.6 11/14/2024 1503   EOSABS 0.2 11/14/2024 1503   BASOSABS 0.1 11/14/2024 1503    BMET    Component Value Date/Time   NA 135 11/24/2024 0409   NA 138 10/18/2024 1420   K 3.7 11/24/2024 0409   CL 100 11/24/2024 0409   CO2 24 11/24/2024 0409   GLUCOSE 118 (H) 11/24/2024 0409   BUN 19 11/24/2024 0409   BUN 29 (H) 10/18/2024 1420   CREATININE 1.09 11/24/2024 0409   CALCIUM  8.7 (L) 11/24/2024 0409   GFRNONAA >60 11/24/2024 0409   GFRAA >60 10/02/2017 0943    INR    Component Value Date/Time   INR 1.1 05/18/2023 1521     Intake/Output Summary (Last 24 hours) at 11/26/2024 1125 Last data filed at 11/26/2024 0600 Gross per 24 hour  Intake 0 ml  Output 450 ml  Net -450 ml     Assessment/Plan:  73 y.o. male is s/p SEE ABOVE  5 Days Post-Op   PLAN Pain medication PRN OOB to chair with assist  Advance diet as tolerated Patient to use wheelchair at this time for ambulation due to overall weakness and scab to his anterior left BKA.  Okay to transfer to  SNF when bed is available.      DVT prophylaxis:  ASA 81 mg Daily and Plavix  75 mg daily   Jordan Arellano [facility] 11/26/2024 11:25 AM   "

## 2024-11-26 NOTE — Progress Notes (Signed)
 Occupational Therapy Treatment Patient Details Name: Jordan Arellano MRN: [id number] DOB: 31-Jul-1951 Today's Date: 11/26/2024   History of present illness Pt is a 73 y.o. male s/p R femoral endarterectomy & thrombectomy on 12/17. PMH significant for R BKA, PVD, L toe amputation, R hip fx   OT comments  Pt seen for OT treatment session this date. Pt presents with flat affect, difficulties problem-solving functional task performance and demonstrates decreased safety awareness. Pt requires MIN A +1 for SPT bed <> BSC. Per chart review, pt currently at wheelchair level due to small scabbed area on residual limb causing pain. Additional time spent during session to educate and discuss appropriate DME and discharge plan with pt and brother-in-law. Pt unable to discharge to his home as it is not wheelchair accessible due to narrow hallways and inability to fit wheelchair in bathroom space, additionally has several steps to enter. Pt does require +1 hands-on assist for safe transfers. Discharge recommendation updated to SNF for general strengthening and mobility during safe, efficient ADL performance. Pt and brother in-law encouraged to plan for discharge after stay at rehab. Patient will benefit from continued inpatient follow up therapy, <3 hours/day       If plan is discharge home, recommend the following:  A little help with bathing/dressing/bathroom;A little help with walking and/or transfers   Equipment Recommendations  BSC/3in1;Tub/shower bench (drop arm BSC)       Precautions / Restrictions Precautions Precautions: Fall Recall of Precautions/Restrictions: Intact Precaution/Restrictions Comments: old R BKA Restrictions Weight Bearing Restrictions Per Provider Order: No Other Position/Activity Restrictions: per chart review, pt with small scab on residual limb. documentation states pt currently wheelchair level to allow for wound healing, do not use prothesis at this time.       Mobility  Bed Mobility Overal bed mobility: Modified Independent                  Transfers Overall transfer level: Needs assistance Equipment used: None Transfers: Bed to chair/wheelchair/BSC Sit to Stand: Min assist   Squat pivot transfers: Min assist       General transfer comment: benefits from +1 for stand pivot transfers for general safety     Balance Overall balance assessment: Needs assistance, Mild deficits observed, not formally tested Sitting-balance support: Feet supported Sitting balance-Leahy Scale: Normal                                     ADL either performed or assessed with clinical judgement   ADL Overall ADL's : Needs assistance/impaired                     Lower Body Dressing: Sitting/lateral leans;Supervision/safety Lower Body Dressing Details (indicate cue type and reason): don / doff shoe Toilet Transfer: BSC/3in1;Minimal assistance;Requires drop arm;Stand-pivot Toilet Transfer Details (indicate cue type and reason): bed <> BSC, pt generally unsteady, cues for hand placement and general sequencing. would benefit from drop arm BSC           General ADL Comments: pt will need +1 at all times for safe transfers     Communication Communication Communication: Impaired Factors Affecting Communication: Hearing impaired   Cognition Arousal: Alert Behavior During Therapy: Flat affect Cognition: Cognition impaired           Executive functioning impairment (select all impairments): Problem solving, Reasoning, Sequencing, Organization, Initiation OT - Cognition Comments: pt with extremely flat affect, difficulties  problem solving and decreased safety awareness noted.                 Following commands: Intact        Cueing   Cueing Techniques: Verbal cues  Exercises Exercises: Other exercises Other Exercises Other Exercises: extensive time discussing discharge plan with pt and brother-in-law. brother in law  reports that pt's home is not wheelchair accessible, has narrow hallways and wheelchair will not fit into bathroom. family cannot provide necessary level of assist for pt to discharge to sister's home            Pertinent Vitals/ Pain       Pain Assessment Pain Assessment: Faces Faces Pain Scale: Hurts a little bit Pain Location: R groin Pain Descriptors / Indicators: Discomfort, Sore Pain Intervention(s): Limited activity within patient's tolerance   Frequency  Min 2X/week        Progress Toward Goals  OT Goals(current goals can now be found in the care plan section)  Progress towards OT goals: Progressing toward goals  Acute Rehab OT Goals OT Goal Formulation: With patient Time For Goal Achievement: 12/06/24 Potential to Achieve Goals: Good ADL Goals Pt Will Perform Grooming: with modified independence;sitting;standing Pt Will Perform Lower Body Dressing: with modified independence;sit to/from stand;sitting/lateral leans Pt Will Transfer to Toilet: with modified independence;ambulating Pt Will Perform Toileting - Clothing Manipulation and hygiene: with modified independence;sitting/lateral leans;sit to/from stand  Plan         AM-PAC OT 6 Clicks Daily Activity     Outcome Measure   Help from another person eating meals?: None Help from another person taking care of personal grooming?: None Help from another person toileting, which includes using toliet, bedpan, or urinal?: A Little Help from another person bathing (including washing, rinsing, drying)?: A Little Help from another person to put on and taking off regular upper body clothing?: None Help from another person to put on and taking off regular lower body clothing?: A Little 6 Click Score: 21    End of Session Equipment Utilized During Treatment: Gait belt  OT Visit Diagnosis: Other abnormalities of gait and mobility (R26.89)   Activity Tolerance Patient tolerated treatment well   Patient Left in  bed;with call bell/phone within reach;with family/visitor present;with bed alarm set   Nurse Communication Mobility status        Time: 8978-8946 OT Time Calculation (min): 32 min  Charges: OT General Charges $OT Visit: 1 Visit OT Treatments $Self Care/Home Management : 23-37 mins Quashon Jesus L. Macsen Nuttall, OTR/L  11/26/2024, 11:11 AM

## 2024-11-26 NOTE — Interval H&P Note (Signed)
 History and Physical Interval Note:  11/26/2024 12:39 PM  Jordan Arellano  has presented today for surgery, with the diagnosis of ASO WITH REST PAIN.  The various methods of treatment have been discussed with the patient and family. After consideration of risks, benefits and other options for treatment, the patient has consented to  Procedures: ENDARTERECTOMY, FEMORAL (Right) APPLICATION OF CELL SAVER (N/A) THROMBECTOMY, ARTERY, FEMORAL (Right) as a surgical intervention.  The patient's history has been reviewed, patient examined, no change in status, stable for surgery.  I have reviewed the patient's chart and labs.  Questions were answered to the patient's satisfaction.     Cordella Shawl

## 2024-11-26 NOTE — Plan of Care (Signed)

## 2024-11-26 NOTE — NC FL2 (Signed)
 " [place]  MEDICAID FL2 LEVEL OF CARE FORM     IDENTIFICATION  Patient Name: Jordan Arellano Birthdate: November 17, 1951 Sex: male Admission Date (Current Location): 11/21/2024  [facility] and [place] Number:  Chiropodist and Address:         Provider Number: [id number]  Attending Physician Name and Address:  Jama Cordella MATSU, MD  Relative Name and Phone Number:       Current Level of Care: Hospital Recommended Level of Care: Skilled Nursing Facility Prior Approval Number:    Date Approved/Denied:   PASRR Number: [id number]  Discharge Plan: SNF    Current Diagnoses: Patient Active Problem List   Diagnosis Date Noted   Atherosclerosis of artery of extremity with rest pain (HCC) 11/21/2024   Microhematuria 09/27/2024   CKD stage 2 due to type 2 diabetes mellitus (HCC) 07/11/2024   Microalbuminuria due to type 2 diabetes mellitus (HCC) 07/11/2024   Acute cystitis with hematuria 06/29/2024   Atherosclerosis of native coronary artery of native heart without angina pectoris 06/28/2024   Bilateral nephrolithiasis 06/28/2024   Calculus of gallbladder without cholecystitis without obstruction 06/28/2024   Anemia of chronic disease 09/01/2023   Vitamin D  deficiency 09/01/2023   Heart failure with preserved ejection fraction (HCC) 05/18/2023   At risk for constipation 05/18/2023   Mild cognitive impairment 04/25/2023   Malnutrition of mild degree 04/18/2023   Type II diabetes mellitus with complication (HCC) 03/27/2023   Conductive hearing loss of right ear 08/19/2022   Left renal artery stenosis 06/27/2020   Major depressive disorder in remission 06/27/2020   Atherosclerosis of native arteries of extremity with rest pain (HCC) 10/01/2017   Status post femoral-popliteal bypass surgery 09/30/2017   DM (diabetes mellitus) with peripheral vascular complication (HCC) 12/19/2016   Dyslipidemia 10/01/2016   Status post below-knee amputation of right lower extremity (HCC)  09/30/2016   Neuropathy 09/30/2016   PAD s/p right BKA, history revascularization left leg 09/30/2016   Phantom pain after amputation of lower extremity (HCC) 09/30/2016   History of CVA (cerebrovascular accident) without residual deficits 03/25/2014   Essential hypertension 03/25/2014    Orientation RESPIRATION BLADDER Height & Weight     Self, Time, Situation, Place  Normal Continent Weight:   Height:  6' 1 (185.4 cm)  BEHAVIORAL SYMPTOMS/MOOD NEUROLOGICAL BOWEL NUTRITION STATUS      Continent Diet (Carb modifed)  AMBULATORY STATUS COMMUNICATION OF NEEDS Skin   Extensive Assist Verbally Surgical wounds, Other (Comment) (Scab right BKA stump)                       Personal Care Assistance Level of Assistance              Functional Limitations Info             SPECIAL CARE FACTORS FREQUENCY  PT (By licensed PT), OT (By licensed OT)                    Contractures Contractures Info: Not present    Additional Factors Info  Code Status, Allergies Code Status Info: Full Allergies Info: NKDA           Current Medications (11/26/2024):  This is the current hospital active medication list Current Facility-Administered Medications  Medication Dose Route Frequency Provider Last Rate Last Admin   0.9 %  sodium chloride  infusion  500 mL Intravenous Once PRN Schnier, Gregory G, MD       acetaminophen  (TYLENOL ) tablet 325-650 mg  325-650 mg Oral Q4H PRN Schnier, Gregory G, MD       Or   acetaminophen  (TYLENOL ) suppository 325-650 mg  325-650 mg Rectal Q4H PRN Schnier, Gregory G, MD       amLODipine  (NORVASC ) tablet 5 mg  5 mg Oral Landrum Shawl, Gregory G, MD   5 mg at 11/26/24 9370   aspirin  EC tablet 81 mg  81 mg Oral Daily Schnier, Gregory G, MD   81 mg at 11/26/24 0920   clopidogrel  (PLAVIX ) tablet 75 mg  75 mg Oral Q0600 Schnier, Gregory G, MD   75 mg at 11/26/24 0630   docusate sodium  (COLACE) capsule 100 mg  100 mg Oral Daily Schnier, Gregory G, MD    100 mg at 11/26/24 9080   enoxaparin  (LOVENOX ) injection 40 mg  40 mg Subcutaneous Q24H Schnier, Gregory G, MD   40 mg at 11/26/24 9080   hydrALAZINE  (APRESOLINE ) injection 5 mg  5 mg Intravenous Q20 Min PRN Schnier, Cordella MATSU, MD       HYDROmorphone  (DILAUDID ) injection 1 mg  1 mg Intravenous Once PRN Brown, Fallon E, NP       icosapent  Ethyl (VASCEPA ) 1 g capsule 2 g  2 g Oral BID Schnier, Gregory G, MD   2 g at 11/26/24 9078   insulin  aspart (novoLOG ) injection 0-15 Units  0-15 Units Subcutaneous TID WC Schnier, Gregory G, MD   2 Units at 11/26/24 1744   insulin  aspart (novoLOG ) injection 0-5 Units  0-5 Units Subcutaneous QHS Schnier, Cordella MATSU, MD       irbesartan  (AVAPRO ) tablet 150 mg  150 mg Oral Daily Schnier, Gregory G, MD   150 mg at 11/26/24 9080   labetalol  (NORMODYNE ) injection 10 mg  10 mg Intravenous Q10 min PRN Schnier, Gregory G, MD       metoprolol  tartrate (LOPRESSOR ) injection 2.5-5 mg  2.5-5 mg Intravenous Q2H PRN Schnier, Gregory G, MD       morphine  (PF) 2 MG/ML injection 2-5 mg  2-5 mg Intravenous Q1H PRN Schnier, Gregory G, MD   4 mg at 11/24/24 2111   nitroGLYCERIN  50 mg in dextrose  5 % 250 mL (0.2 mg/mL) infusion  5-250 mcg/min Intravenous Titrated Schnier, Cordella MATSU, MD       ondansetron  (ZOFRAN ) injection 4 mg  4 mg Intravenous Q6H PRN Schnier, Cordella MATSU, MD       ondansetron  (ZOFRAN ) injection 4 mg  4 mg Intravenous Q6H PRN Brown, Fallon E, NP   4 mg at 11/23/24 9057   oxyCODONE  (Oxy IR/ROXICODONE ) immediate release tablet 5-10 mg  5-10 mg Oral Q4H PRN Schnier, Gregory G, MD   10 mg at 11/25/24 2014   phenol (CHLORASEPTIC) mouth spray 1 spray  1 spray Mouth/Throat PRN Schnier, Cordella MATSU, MD       pneumococcal 20-valent conjugate vaccine (PREVNAR 20) injection 0.5 mL  0.5 mL Intramuscular Tomorrow-1000 Schnier, Cordella MATSU, MD       polyethylene glycol (MIRALAX  / GLYCOLAX ) packet 17 g  17 g Oral Daily PRN Shona Laurence N, DO       potassium chloride  SA (KLOR-CON  M) CR  tablet 40-60 mEq  40-60 mEq Oral Daily PRN Schnier, Gregory G, MD       rosuvastatin  (CRESTOR ) tablet 40 mg  40 mg Oral Daily Schnier, Gregory G, MD   40 mg at 11/26/24 0919   sertraline  (ZOLOFT ) tablet 100 mg  100 mg Oral Landrum Schnier, Cordella MATSU, MD   100 mg at  11/26/24 0629   sorbitol  70 % solution 30 mL  30 mL Oral Daily PRN Schnier, Gregory G, MD         Discharge Medications: Please see discharge summary for a list of discharge medications.  Relevant Imaging Results:  Relevant Lab Results:   Additional Information [id number]  [name], RN     "

## 2024-11-26 NOTE — Progress Notes (Signed)
 Physical Therapy Treatment Patient Details Name: Jordan Arellano MRN: [id number] DOB: [birth year] Today's Date: 11/26/2024   History of Present Illness Pt is a 73 year old male Right common femoral endarterectomy and patch angioplasty, Right iliac thromboembolectomy with 4 Fogarty embolectomy balloon,  Separate and distinct right profunda femoris artery endarterectomy and separate patch angioplasty,  Placement of antibiotic impregnated beads, Redo femoral exposure after previous right femoral to popliteal bypass on 11/21/24    PT Comments  Pt ready for session.  Session focused on wheelchair mobility and safety with transfers.  He is orientated to brakes and technique for stand pivot tranfers.  He opts to transfer without RW to/from and benefits from min a x 1 for balance and safety.  He is able to navigate and turn chair well in hallway and room and good awareness of brakes prior to getting in/out of chair.    Pt would benefit from +1 assist at home for general safety with transfers due to general weakness and balance from immobility.  Tentative plan is to go home with sister.  When asked if she can provide +1 assist he stated she can but also that she will be in and out.  Per discussion with team it is unclear if sister will truly provide care.  Wheelchair has been ordered for home and he is aware he should be wheelchair level to allow for healing of wound and to prevent progression.  He would benefit from +1 initially upon discharge for safety.  If sister is unable to provide care, it seems his home may be unsafe at this time and SNF may be appropriate for discharge to increase strength and general safety before transition home.  Will update discharge recommendations to reflect to assist with discharge planning if sister is not an option.  Pt is agreeable to SNF if needed.     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 Restrictions Weight Bearing Restrictions Per Provider Order: No     Mobility  Bed Mobility Overal bed mobility: Modified Independent               Patient Response: Cooperative, Flat affect  Transfers Overall transfer level: Needs assistance Equipment used: None Transfers: Bed to chair/wheelchair/BSC     Step pivot transfers: Min assist, Contact guard assist       General transfer comment: benefits from +1 for stand pivot transfers for general safety    Ambulation/Gait                   Psychologist, Counselling mobility: Yes Wheelchair propulsion: Both upper extremities, Left upper extremity Wheelchair parts: Supervision/cueing Distance: 100 Wheelchair Assistance Details (indicate cue type and reason): general education for wheelchair use and safety.   Tilt Bed Tilt Bed Patient Response: Cooperative, Flat affect  Modified Rankin (Stroke Patients Only)       Balance Overall balance assessment: Needs assistance, Mild deficits observed, not formally tested Sitting-balance support: Feet supported Sitting balance-Leahy Scale: Normal       Standing  balance-Leahy Scale: Fair                              Hotel Manager: Impaired Factors Affecting Communication: Hearing impaired  Cognition Arousal: Alert Behavior During Therapy: Flat affect   PT - Cognitive impairments: No apparent impairments                         Following commands: Intact      Cueing Cueing Techniques: Verbal  cues  Exercises      General Comments        Pertinent Vitals/Pain Pain Assessment Pain Assessment: Faces Faces Pain Scale: Hurts a little bit Pain Location: groin from proceedure Pain Descriptors / Indicators: Discomfort, Sore Pain Intervention(s): Limited activity within patient's tolerance, Monitored during session, Repositioned    Home Living                          Prior Function            PT Goals (current goals can now be found in the care plan section) Progress towards PT goals: Progressing toward goals    Frequency    Min 2X/week      PT Plan      Co-evaluation              AM-PAC PT 6 Clicks Mobility   Outcome Measure  Help needed turning from your back to your side while in a flat bed without using bedrails?: None Help needed moving from lying on your back to sitting on the side of a flat bed without using bedrails?: None Help needed moving to and from a bed to a chair (including a wheelchair)?: A Little Help needed standing up from a chair using your arms (e.g., wheelchair or bedside chair)?: A Little Help needed to walk in hospital room?: A Little Help needed climbing 3-5 steps with a railing? : A Little 6 Click Score: 20    End of Session Equipment Utilized During Treatment: Gait belt;Other (comment) (prosthesis)   Patient left: with call bell/phone within reach;in bed;with bed alarm set Nurse Communication: Mobility status PT Visit Diagnosis: Pain;Other abnormalities of gait and mobility (R26.89);Difficulty in walking, not elsewhere classified (R26.2) Pain - Right/Left: Right Pain - part of body: Leg     Time: 9041-8988 PT Time Calculation (min) (ACUTE ONLY): 13 min  Charges:    $Therapeutic Activity: 8-22 mins PT General Charges $$ ACUTE PT VISIT: 1 Visit                    Lauraine Gills, PTA 11/26/2024, 10:27 AM

## 2024-11-26 NOTE — TOC Progression Note (Signed)
 Transition of Care [facility]) - Progression Note    Patient Details  Name: Nishawn Rotan MRN: [id number] Date of Birth: 04-Jul-1951  [facility]) CM/SW Contact  [name], RN Phone Number: 11/26/2024, 7:00 PM  Clinical Narrative:     Therapy has changed recs to SNF.  Patient and sister Jenkins in agreement.  Fl2 sent for signature, bed search initiated                     Expected Discharge Plan and Services                                               Social Drivers of Health (SDOH) Interventions SDOH Screenings   Food Insecurity: No Food Insecurity (11/21/2024)  Housing: Low Risk (11/21/2024)  Transportation Needs: No Transportation Needs (11/21/2024)  Utilities: Not At Risk (11/21/2024)  Alcohol Screen: Low Risk (08/18/2023)  Depression (PHQ2-9): Medium Risk (06/28/2024)  Financial Resource Strain: Low Risk  (10/03/2024)   Received from [facility]  Physical Activity: Insufficiently Active (08/18/2023)  Social Connections: Socially Isolated (11/21/2024)  Stress: No Stress Concern Present (08/18/2023)  Tobacco Use: High Risk (11/21/2024)  Health Literacy: Adequate Health Literacy (08/18/2023)    Readmission Risk Interventions     No data to display

## 2024-11-26 NOTE — Telephone Encounter (Signed)
 Patients sister [name] Pinal contacted office at this time stating her brother had surgery 12/17 and is in the hospital, social work had spoken with her and stated patient might be released today. [name] was concerned about a place on her brothers leg as well as his discharge from the hospital being today. She stated she just had surgery herself and she is unable to assist or care for him at this time. I spoke with Dr. Jama at this time and he stated the patient is tentatively scheduled for discharge on Wednesday and that his stump is fine at this time. Sister has been advised and shared relief.

## 2024-11-26 NOTE — Discharge Instructions (Signed)
 Vascular surgery discharge instructions  Do not lift anything heavy for the next 3 weeks.  Do not lift anything more than a gallon of milk.  Do not drive until your staples are removed.  Do not drive if you are taking narcotic medication.  You may shower tomorrow 11/28/2024 after you get home. Shower with the old dressing in place and remove immediately afterwards. Do not scrub staples let water  and soap run over them and pat completely dry . Paint the staples with Betadine two times a day.   You are being discharged on ASA 81 mg daily and Plavix  75 mg Daily and Crestor  40 mg daily. Do not miss or skip taking any of these medications as it will interfere with the outcome of your surgery.   Follow up with [facility] V[facility] for staple removal as scheduled.

## 2024-11-27 ENCOUNTER — Other Ambulatory Visit: Payer: Self-pay

## 2024-11-27 LAB — GLUCOSE, CAPILLARY
Glucose-Capillary: 156 mg/dL — ABNORMAL HIGH (ref 70–99)
Glucose-Capillary: 125 mg/dL — ABNORMAL HIGH (ref 70–99)

## 2024-11-27 MED ORDER — ASPIRIN 81 MG PO TBEC
81.0000 mg | DELAYED_RELEASE_TABLET | Freq: Every day | ORAL | 12 refills | Status: AC
  Filled 2024-11-27: qty 30, 30d supply, fill #0

## 2024-11-27 MED ORDER — IRBESARTAN 150 MG PO TABS
150.0000 mg | ORAL_TABLET | Freq: Every day | ORAL | 11 refills | Status: AC
  Filled 2024-11-27: qty 30, 30d supply, fill #0

## 2024-11-27 NOTE — Progress Notes (Addendum)
 " Progress Note    11/27/2024 9:50 AM 6 Days Post-Op  Subjective:   Misha Antonini is a 73 yo male who is now POD #5 From:   Nirvan Laban is a 73 yo male now POD #1 from:   PROCEDURE: Right common femoral endarterectomy with bovine pericardial patch angioplasty. Separate and distinct right profunda femoris endarterectomy with bovine pericardial patch angioplasty. Fogarty thromboembolectomy of the right common iliac and external iliac arteries. 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   ASSISTANT(S): None   ANESTHESIA: general   ESTIMATED BLOOD LOSS: 450 cc   Patient is resting comfortably in bed this morning. He endorses there is some pain to his right groin but very tolerable. It is more sore than anything. Patient noted to have a scab to the anterior of his stump. It measures 4 mm round. It is not open or bleeding this morning. No complaints overnight and vitals all remain stable.    Vitals:   11/27/24 0508 11/27/24 0848  BP: 114/72 125/67  Pulse: 63 66  Resp: 16 18  Temp: 98.1 F (36.7 C) 98.4 F (36.9 C)  SpO2: 97% 99%   Physical Exam: Cardiac:  RRR, Normal S1, S2. No Murmurs  Lungs:  Non labored breathing, clear throughout on auscultation. No rales Rhonchi or wheezing to note.  Incisions:  Right groin with dressing clean dry and intact. No hematoma, seroma to note.  Extremities:  Right BKA, Leg is warm on auscultation. Left lower extremity is warm to touch with palpable pulses.  Abdomen:  Positive bowel sound on auscultation throughout, soft non tender and non distended.  Neurologic: AAOX3, answers all questions and follows commands appropriately.   CBC    Component Value Date/Time   WBC 8.8 11/24/2024 0409   RBC 3.55 (L)  11/24/2024 0409   HGB 10.6 (L) 11/24/2024 0409   HCT 32.6 (L) 11/24/2024 0409   PLT 149 (L) 11/24/2024 0409   MCV 91.8 11/24/2024 0409   MCH 29.9 11/24/2024 0409   MCHC 32.5 11/24/2024 0409   RDW 15.1 11/24/2024 0409   LYMPHSABS 3.5 11/14/2024 1503   MONOABS 0.6 11/14/2024 1503   EOSABS 0.2 11/14/2024 1503   BASOSABS 0.1 11/14/2024 1503    BMET    Component Value Date/Time   NA 135 11/24/2024 0409   NA 138 10/18/2024 1420   K 3.7 11/24/2024 0409   CL 100 11/24/2024 0409   CO2 24 11/24/2024 0409   GLUCOSE 118 (H) 11/24/2024 0409   BUN 19 11/24/2024 0409   BUN 29 (H) 10/18/2024 1420   CREATININE 1.09 11/24/2024 0409   CALCIUM  8.7 (L) 11/24/2024 0409   GFRNONAA >60 11/24/2024 0409   GFRAA >60 10/02/2017 0943    INR    Component Value Date/Time   INR 1.1 05/18/2023 1521     Intake/Output Summary (Last 24 hours) at 11/27/2024 0950 Last data filed at 11/27/2024 0510 Gross per 24 hour  Intake --  Output 650 ml  Net -650 ml     Assessment/Plan:  73 y.o. male is s/p SEE ABOVE  6 Days Post-Op   PLAN Pain medication PRN OOB to chair with assist  Advance diet as tolerated Patient to use wheelchair at this time for ambulation due to overall weakness and scab to his anterior left BKA.  Okay to transfer to SNF when bed is available.      DVT prophylaxis:  ASA 81 mg Daily and Plavix  75 mg daily   Gwendlyn JONELLE Shank [facility] 11/27/2024 9:50 AM   "

## 2024-11-27 NOTE — Progress Notes (Signed)
 Physical Therapy Treatment Patient Details Name: Jordan Arellano MRN: [id number] DOB: [birth year] Today's Date: 11/27/2024   History of Present Illness Pt is a 73 y.o. male s/p R femoral endarterectomy & thrombectomy on 12/17. PMH significant for R BKA, PVD, L toe amputation, R hip fx    PT Comments  Pt bed and reports sore groin and soreness from immobility.  He agrees to activity and OOB to chair.  Bed mobility and sitting balance Mod I.  He is able to stand to RW with min a x 1 needing some guarding and light assist for balance during transition to RW. Once up, he is generally steady and participates in RLE AROM with emphasis on upright posture and hip extension on R which he does have difficulty getting to neutral.  He transfers to recliner with min a x 1.  Without prosthesis he does require increased assist and guarding for general safety and balance.  Seated AROM.  After discussion with team yesterday, pt will not have support needed for discharge home as planned.  Pt will benefit from transition to < 3 hrs a day therapy at discharge to increase overall strength, balance and activity tolerance as he has had a decline since admission and not being able to wear his prosthesis has affected his balance and safety for transfers and mobility.    Will continue with therapy interventions without prosthesis at this time.    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 documentation states pt currently wheelchair level to allow for wound healing, do not use prothesis at this time.     Mobility  Bed Mobility Overal bed mobility: Modified Independent               Patient Response: Cooperative, Flat affect  Transfers Overall transfer level: Needs assistance Equipment used: Rolling walker (2 wheels) Transfers: Sit to/from Stand Sit to Stand: Min assist                Ambulation/Gait Ambulation/Gait assistance: Min Chemical Engineer (Feet): 3 Feet Assistive device: Rolling walker (2 wheels) Gait Pattern/deviations: Step-to pattern Gait velocity: decr     General Gait Details: no prosthesis, limited to transfer due to fatigue and likely some motivation   Stairs             Wheelchair Mobility     Tilt Bed Tilt Bed Patient Response: Cooperative, Flat affect  Modified Rankin (Stroke Patients Only)       Balance Overall balance assessment: Needs assistance, Mild deficits observed, not formally tested Sitting-balance support: Feet supported Sitting balance-Leahy Scale: Normal     Standing balance support: Bilateral upper extremity supported, During functional activity, Reliant on assistive device for balance Standing balance-Leahy Scale: Fair Standing balance comment: increased assist without prosthesis                            Communication Communication Communication: Impaired Factors Affecting Communication: Hearing impaired  Cognition  Arousal: Alert Behavior During Therapy: Flat affect   PT - Cognitive impairments: No apparent impairments                       PT - Cognition Comments: plesant but does not initiate conversation but responds appropriately Following commands: Intact      Cueing Cueing Techniques: Verbal cues  Exercises Other Exercises Other Exercises: standing and seated AROM    General  Comments        Pertinent Vitals/Pain Pain Assessment Pain Assessment: Faces Faces Pain Scale: Hurts little more Pain Location: R groin Pain Descriptors / Indicators: Discomfort, Sore Pain Intervention(s): Limited activity within patient's tolerance, Monitored during session, Repositioned    Home Living                          Prior Function            PT Goals (current goals can now be found in the care plan section) Progress towards PT goals: Progressing toward goals    Frequency    Min 2X/week      PT Plan      Co-evaluation              AM-PAC PT 6 Clicks Mobility   Outcome Measure  Help needed turning from your back to your side while in a flat bed without using bedrails?: None Help needed moving from lying on your back to sitting on the side of a flat bed without using bedrails?: None Help needed moving to and from a bed to a chair (including a wheelchair)?: A Little Help needed standing up from a chair using your arms (e.g., wheelchair or bedside chair)?: A Little Help needed to walk in hospital room?: A Little Help needed climbing 3-5 steps with a railing? : Total 6 Click Score: 18    End of Session Equipment Utilized During Treatment: Gait belt;Other (comment) (prosthesis)   Patient left: with call bell/phone within reach;in chair;with chair alarm set Nurse Communication: Mobility status PT Visit Diagnosis: Pain;Other abnormalities of gait and mobility (R26.89);Difficulty in walking, not elsewhere classified (R26.2) Pain - Right/Left: Right Pain - part of body: Leg     Time: 1000-1010 PT Time Calculation (min) (ACUTE ONLY): 10 min  Charges:    $Therapeutic Activity: 8-22 mins PT General Charges $$ ACUTE PT VISIT: 1 Visit                   Lauraine Gills, PTA 11/27/2024, 10:22 AM

## 2024-11-27 NOTE — Discharge Summary (Signed)
 " [facility]    Discharge Summary    Patient ID:  Jordan Arellano MRN: [id number] DOB/AGE: 08-14-1951 73 y.o.  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, unspecified extremity (HCC) [I70.229] Atherosclerosis of artery of extremity with rest pain (HCC) [I70.229]  Secondary Diagnoses: Past Medical History:  Diagnosis Date   Acute hematogenous osteomyelitis of left foot (HCC) 05/19/2023   Anemia of chronic disease    Aortic atherosclerosis    Atherosclerosis of native arteries of extremity with rest pain (HCC)    Basal cell carcinoma 08/11/2022   R forearm - ED&C   Bifascicular block    Bilateral nephrolithiasis    Calculus of gallbladder without cholecystitis    Cessation of tobacco use in previous 12 months 09/01/2023   Cholelithiasis    CKD (chronic kidney disease) stage 2, GFR 60-89 ml/min    Conductive hearing loss in right ear    Coronary artery calcification seen on CT scan    Critical limb ischemia of right lower extremity (HCC)    Depression    Diverticulosis    DM (diabetes mellitus), type 2 (HCC)    Foot ulcer (HCC) 12/12/2016   Hyperlipidemia    Hypertension    Infrarenal abdominal aortic aneurysm (AAA) without rupture    Left renal artery stenosis    Long term current use of clopidogrel     Long-term use of aspirin  therapy    Mild cognitive impairment    Multiple pulmonary nodules    PAD (peripheral artery disease) ~2007   s/p R BKA for non-healing wound   PONV (postoperative nausea and vomiting)    after Oct 2025 LE intervention   Protein-calorie malnutrition,  severe 05/24/2023   Status post below-knee amputation of right lower extremity (HCC)    Status post femoral-popliteal bypass surgery    Stroke (HCC) 03/2014   MRI: Acute nonhemorrhagic left paracentral pontine infarct. 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   TIA (transient ischemic attack) 01/2014   Vitamin D  deficiency     Procedures: ENDARTERECTOMY, FEMORAL APPLICATION OF CELL SAVER THROMBECTOMY, ARTERY, FEMORAL  PROCEDURE: Right common femoral endarterectomy with bovine pericardial patch angioplasty. Separate and distinct right profunda femoris endarterectomy with bovine pericardial patch angioplasty. Fogarty thromboembolectomy of the right common iliac and external iliac arteries. Redo vascular surgery right lower extremity   PRE-OPERATIVE DIAGNOSIS: Atherosclerotic occlusive disease right lower extremity with rest pain symptoms; hypertension; diabetes mellitus   POST-OPERATIVE DIAGNOSIS: Same   CO-SURGEON: Cordella JUDITHANN Shawl, MD and Selinda CANDIE Marea, M.D.   ASSISTANT(S): None   ANESTHESIA: general   ESTIMATED BLOOD LOSS: 450 cc   FINDING(S): Profound combination calcific plaque with hyperplasia noted of the right common femoral.  Profound calcific plaque right profunda femoris.  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   SPECIMEN(S):  Plaque from the right common femoral and the right profunda femoris artery.  Subacute thrombus from the right iliac arteries  Discharged Condition: good  HPI:  Jordan Arellano 73 y.o. y.o.male who presents with complaints of lifestyle limiting claudication and pain continuously in the right lower extremity. The patient has documented severe atherosclerotic occlusive disease and  has undergone minimally invasive treatments in the past. However, at this point his primary area of stricture stenosis resides in the common femoral and origins of the superficial femoral and profunda femoris extending into these arteries and therefore this is not amenable to intervention. The patient is therefore undergoing open endarterectomy.   Hospital Course:  Jordan Arellano is a 73 y.o. male is S/PRight common femoral endarterectomy with bovine pericardial patch angioplasty. Separate and distinct right profunda femoris endarterectomy with bovine pericardial patch angioplasty. Fogarty thromboembolectomy of the right common iliac and external iliac arteries. Redo vascular surgery right lower extremity  Patient is being discharged on ASA 81 mg daily , Plavix  75 mg Daily and Crestor  40 mg Daily. Patient was instructed not to miss or skip taking any of these medications as it will alter the outcome of your surgery. Patient verbalized his understanding.   I spent greater than 60 minutes in developing, implementing, teaching and discharging this patient.     Extubated: POD # 0 Physical Exam:  Alert notes x3, no acute distress Face: Symmetrical.  Tongue is midline. Neck: Trachea is midline.  No swelling or bruising. Cardiovascular: Regular rate and rhythm Pulmonary: Clear to auscultation bilaterally Abdomen: Soft, nontender, nondistended Right groin access: Clean dry and intact.  No swelling or drainage noted Left lower extremity: Thigh soft.  Calf soft.  Extremities warm distally toes.  Hard to palpate pedal pulses however the foot is warm is her good capillary refill. Right lower extremity: Thigh soft.  Calf soft.  Extremities warm distally toes.  Hard to palpate pedal pulses however the foot is warm is her good capillary refill. Neurological: No deficits noted   Post-op wounds:  clean, dry, intact or healing well  Pt. Ambulating, voiding and taking PO diet without difficulty. Pt pain  controlled with PO pain meds.  Labs:  As below  Complications: none  Consults:    Significant Diagnostic Studies: CBC Lab Results  Component Value Date   WBC 8.8 11/24/2024   HGB 10.6 (L) 11/24/2024   HCT 32.6 (L) 11/24/2024   MCV 91.8 11/24/2024   PLT 149 (L) 11/24/2024    BMET    Component Value Date/Time   NA 135 11/24/2024 0409   NA 138 10/18/2024 1420   K 3.7 11/24/2024 0409   CL 100 11/24/2024 0409   CO2 24 11/24/2024 0409   GLUCOSE 118 (H) 11/24/2024 0409   BUN 19 11/24/2024 0409   BUN 29 (H) 10/18/2024 1420   CREATININE 1.09 11/24/2024 0409   CALCIUM  8.7 (L) 11/24/2024 0409   GFRNONAA >60 11/24/2024 0409   GFRAA >60 10/02/2017 0943   COAG Lab Results  Component Value Date   INR 1.1 05/18/2023   INR 1.1 03/21/2023   INR 1.00 09/30/2017     Disposition:  Discharge to :Rehab  Allergies as of 11/27/2024   No Known Allergies      Medication List     STOP taking these medications    aspirin  81 MG tablet Replaced by: aspirin  EC 81 MG tablet   olmesartan  40 MG tablet Commonly known as: BENICAR  Replaced by: irbesartan  150 MG tablet       TAKE these medications    Accu-Chek FastClix Lancets Misc Use to check blood sugar 2 times a day   Accu-Chek Guide test strip  Generic drug: glucose blood Use to check blood sugar 2 times a day   Accu-Chek Guide w/Device Kit Use to check blood sugar 2 times a day   amLODipine  5 MG tablet Commonly known as: NORVASC  Take 1 tablet (5 mg total) by mouth daily. What changed: when to take this   ascorbic acid  500 MG tablet Commonly known as: VITAMIN C  Take 1 tablet (500 mg total) by mouth daily.   aspirin  EC 81 MG tablet Take 1 tablet (81 mg total) by mouth daily. Swallow whole. Start taking on: November 28, 2024 Replaces: aspirin  81 MG tablet   clopidogrel  75 MG tablet Commonly known as: Plavix  Take 1 tablet (75 mg total) by mouth daily.   dapagliflozin  propanediol 10 MG Tabs  tablet Commonly known as: Farxiga  Take 1 tablet (10 mg total) by mouth daily.   ENSURE HIGH PROTEIN PO Take 1 Can by mouth daily at 6 (six) AM.   icosapent  Ethyl 1 g capsule Commonly known as: Vascepa  Take 2 capsules (2 g total) by mouth 2 (two) times daily.   irbesartan  150 MG tablet Commonly known as: AVAPRO  Take 1 tablet (150 mg total) by mouth daily. Start taking on: November 28, 2024 Replaces: olmesartan  40 MG tablet   Iron  (Ferrous Sulfate ) 325 (65 Fe) MG Tabs Take 325 mg by mouth every other day. What changed: when to take this   latanoprost  0.005 % ophthalmic solution Commonly known as: XALATAN  Place 1 drop into both eyes at bedtime.   losartan  50 MG tablet Commonly known as: COZAAR  TAKE 1 TABLET BY MOUTH DAILY What changed: when to take this   metFORMIN  850 MG tablet Commonly known as: GLUCOPHAGE  Take 1 tablet (850 mg total) by mouth 2 (two) times daily with a meal.   multivitamin with minerals Tabs tablet Take 1 tablet by mouth daily.   pentoxifylline  400 MG CR tablet Commonly known as: TRENTAL  TAKE 1 TABLET BY MOUTH THREE TIMES DAILY WITH MEALS   rosuvastatin  40 MG tablet Commonly known as: Crestor  Take 1 tablet (40 mg total) by mouth daily.   senna-docusate 8.6-50 MG tablet Commonly known as: Senokot-S Take 1 tablet by mouth at bedtime as needed for mild constipation.   sertraline  100 MG tablet Commonly known as: ZOLOFT  Take 1 tablet (100 mg total) by mouth daily. What changed: when to take this   Vitamin D  50 MCG (2000 UT) Caps Take 1 capsule (2,000 Units total) by mouth daily.               Durable Medical Equipment  (From admission, onward)           Start     Ordered   11/25/24 1435  For home use only DME standard manual wheelchair with seat cushion  Once       Comments: Patient suffers from RIGHT BKA which impairs their ability to perform daily activities like bathing and toileting in the home.  A walker will not resolve issue  with performing activities of daily living. A wheelchair will allow patient to safely perform daily activities. Patient can safely propel the wheelchair in the home or has a caregiver who can provide assistance. Length of need 6 months . Accessories: elevating leg rests (ELRs), wheel locks, extensions and anti-tippers.   11/25/24 1435           Verbal and written Discharge instructions given to the patient. Wound care per Discharge AVS  Contact information for follow-up providers     Tusayan, Gwendlyn  R, NP Follow up in 3 week(s).   Specialty: Vascular Surgery Why: Post Op staple removal with Arterial duplex U/S of right lower extremity. Contact information: [address] [place] [address] [phone]              Contact information for after-discharge care     Destination     [facility] .   Service: Skilled Nursing Contact information: [address] [place]  [address] [phone]                     Signed: Gwendlyn JONELLE Shank, NP  11/27/2024, 3:49 PM    "

## 2024-11-27 NOTE — Progress Notes (Signed)
 Pt d/c to SNF [facility] via private vehicle, accompanied by sister [name] Pinal. A&O x4. VSS. PIV d/c. Discharge teaching and education complete. Discharge packet sent with patient. Report given to Ciada at [facility].

## 2024-11-27 NOTE — TOC Progression Note (Addendum)
 Transition of Care [facility]) - Progression Note    Patient Details  Name: Jordan Arellano MRN: [id number] Date of Birth: Oct 28, 1951  Transition of Care [facility]) CM/SW Contact  [name], RN Phone Number: 11/27/2024, 9:55 AM  Clinical Narrative:     VM left for sister ann to discuss bed offers    Received return call from [place].  She accepted bed at [facility].  Met with patient at bedside and he is in agreement to plan.   Auth received for [facility]                  Expected Discharge Plan and Services                                               Social Drivers of Health (SDOH) Interventions SDOH Screenings   Food Insecurity: No Food Insecurity (11/21/2024)  Housing: Low Risk (11/21/2024)  Transportation Needs: No Transportation Needs (11/21/2024)  Utilities: Not At Risk (11/21/2024)  Alcohol Screen: Low Risk (08/18/2023)  Depression (PHQ2-9): Medium Risk (06/28/2024)  Financial Resource Strain: Low Risk  (10/03/2024)   Received from [facility]  Physical Activity: Insufficiently Active (08/18/2023)  Social Connections: Socially Isolated (11/21/2024)  Stress: No Stress Concern Present (08/18/2023)  Tobacco Use: High Risk (11/21/2024)  Health Literacy: Adequate Health Literacy (08/18/2023)    Readmission Risk Interventions     No data to display

## 2024-11-27 NOTE — TOC Transition Note (Signed)
 Transition of Care [facility]) - Discharge Note   Patient Details  Name: Jordan Arellano MRN: [id number] Date of Birth: 1951-03-29  Transition of Care [facility]) CM/SW Contact:  [name], RN Phone Number: 11/27/2024, 4:38 PM   Clinical Narrative:     Patient will DC to: [facility] Anticipated DC date:11/27/2024  Family notified:Sister [name] Transport by: Darin Jenkins Line transport was discussed, however I notified Jenkins that due to the requirements for Medical Necessity that patient may receive a bill for transport services. She then decided that she would be transporting   Per MD patient ready for DC to . RN, patient, patient's family, and facility notified of DC. Discharge Summary sent to facility. RN given number for report.   TOC signing off.         Patient Goals and CMS Choice            Discharge Placement                       Discharge Plan and Services Additional resources added to the After Visit Summary for                                       Social Drivers of Health (SDOH) Interventions SDOH Screenings   Food Insecurity: No Food Insecurity (11/21/2024)  Housing: Low Risk (11/21/2024)  Transportation Needs: No Transportation Needs (11/21/2024)  Utilities: Not At Risk (11/21/2024)  Alcohol Screen: Low Risk (08/18/2023)  Depression (PHQ2-9): Medium Risk (06/28/2024)  Financial Resource Strain: Low Risk  (10/03/2024)   Received from [facility]  Physical Activity: Insufficiently Active (08/18/2023)  Social Connections: Socially Isolated (11/21/2024)  Stress: No Stress Concern Present (08/18/2023)  Tobacco Use: High Risk (11/21/2024)  Health Literacy: Adequate Health Literacy (08/18/2023)     Readmission Risk Interventions     No data to display

## 2024-11-27 NOTE — Progress Notes (Signed)
 Occupational Therapy Treatment Patient Details Name: Jordan Arellano MRN: [id number] DOB: 08-06-1951 Today's Date: 11/27/2024   History of present illness Pt is a 73 y.o. male s/p R femoral endarterectomy & thrombectomy on 12/17. PMH significant for R BKA, PVD, L toe amputation, R hip fx   OT comments  Pt received in recliner, agreeable to session focused on functional transfer training without use of prosthesis. Pt performs x3 transfers using RW from recliner <> elevated bed. Requires MIN A +1 using RW and benefits from cues for hand placement and weight shift as he is unsteady without use of R prosthesis. Fatigues quickly but good effort throughout. Pt continues to present with generalized weakness and decreased balance without prosthesis which impacts safe, efficient ADL and mobility performance. Recommend continued inpatient follow up therapy, <3 hours/day.        If plan is discharge home, recommend the following:  A little help with bathing/dressing/bathroom;A little help with walking and/or transfers   Equipment Recommendations  BSC/3in1;Tub/shower bench       Precautions / Restrictions Precautions Precautions: Fall Recall of Precautions/Restrictions: Intact Precaution/Restrictions Comments: old R BKA Restrictions Weight Bearing Restrictions Per Provider Order: No Other Position/Activity Restrictions: per chart review, pt with small scab on residual limb. documentation states pt currently wheelchair level to allow for wound healing, do not use prothesis at this time.       Mobility Bed Mobility Overal bed mobility: Modified Independent             General bed mobility comments: NT, pt recieved and left in recliner    Transfers Overall transfer level: Needs assistance Equipment used: Rolling walker (2 wheels) Transfers: Sit to/from Stand Sit to Stand: Min assist Stand pivot transfers: Min assist         General transfer comment: pt performing x3 transfers from  recliner <> bed. Pt able to stand with vcs for hand placement and general sequencing, relies heavily on BUE support to shuffle for transfers     Balance Overall balance assessment: Needs assistance, Mild deficits observed, not formally tested Sitting-balance support: Feet supported Sitting balance-Leahy Scale: Normal     Standing balance support: Bilateral upper extremity supported, During functional activity, Reliant on assistive device for balance Standing balance-Leahy Scale: Fair Standing balance comment: increased assist without prosthesis                           ADL either performed or assessed with clinical judgement   ADL Overall ADL's : Needs assistance/impaired                     Lower Body Dressing: Sitting/lateral leans;Supervision/safety Lower Body Dressing Details (indicate cue type and reason): don / doff shoe             Functional mobility during ADLs: Minimal assistance;Rolling walker (2 wheels);Cueing for safety;Cueing for sequencing General ADL Comments: session focused on fucntional transfer training with MIN A using RW for pivotal steps recliner <> bed 3x     Communication Communication Communication: Impaired Factors Affecting Communication: Hearing impaired   Cognition Arousal: Alert Behavior During Therapy: Flat affect Cognition: Cognition impaired             OT - Cognition Comments: pt with extremely flat affect, difficulties problem solving and decreased safety awareness noted.                 Following commands: Intact  Cueing   Cueing Techniques: Verbal cues        General Comments VSS on RA    Pertinent Vitals/ Pain       Pain Assessment Pain Assessment: Faces Faces Pain Scale: Hurts little more Pain Location: R groin Pain Descriptors / Indicators: Discomfort, Sore Pain Intervention(s): Limited activity within patient's tolerance         Frequency  Min 2X/week        Progress  Toward Goals  OT Goals(current goals can now be found in the care plan section)  Progress towards OT goals: Progressing toward goals  Acute Rehab OT Goals OT Goal Formulation: With patient Time For Goal Achievement: 12/06/24 Potential to Achieve Goals: Good ADL Goals Pt Will Perform Grooming: with modified independence;sitting;standing Pt Will Perform Lower Body Dressing: with modified independence;sit to/from stand;sitting/lateral leans Pt Will Transfer to Toilet: with modified independence;ambulating Pt Will Perform Toileting - Clothing Manipulation and hygiene: with modified independence;sitting/lateral leans;sit to/from stand  Plan         AM-PAC OT 6 Clicks Daily Activity     Outcome Measure   Help from another person eating meals?: None Help from another person taking care of personal grooming?: None Help from another person toileting, which includes using toliet, bedpan, or urinal?: A Little Help from another person bathing (including washing, rinsing, drying)?: A Little Help from another person to put on and taking off regular upper body clothing?: None Help from another person to put on and taking off regular lower body clothing?: A Little 6 Click Score: 21    End of Session Equipment Utilized During Treatment: Gait belt;Rolling walker (2 wheels)  OT Visit Diagnosis: Other abnormalities of gait and mobility (R26.89)   Activity Tolerance Patient tolerated treatment well   Patient Left in chair;with call bell/phone within reach;with chair alarm set   Nurse Communication Mobility status        Time: 8851-8799 OT Time Calculation (min): 12 min  Charges: OT General Charges $OT Visit: 1 Visit OT Treatments $Therapeutic Activity: 8-22 mins  Bettyanne Dittman L. Dwan Hemmelgarn, OTR/L  11/27/2024, 2:07 PM

## 2024-12-11 ENCOUNTER — Ambulatory Visit

## 2024-12-11 ENCOUNTER — Encounter

## 2024-12-18 ENCOUNTER — Telehealth: Payer: Self-pay

## 2024-12-18 NOTE — Transitions of Care (Post Inpatient/ED Visit) (Signed)
 "  12/18/2024  Name: Jordan Arellano MRN: [id number] DOB: Dec 01, 1951  Today's TOC FU Call Status: Today's TOC FU Call Status:: Successful TOC FU Call Completed TOC FU Call Complete Date: 12/18/24  Patient's Name and Date of Birth confirmed. Name, DOB  Transition Care Management Follow-up Telephone Call Date of Discharge: 12/17/24 Discharge Facility: Other [facility]) Name of Other ([facility]) Discharge Facility: [facility] Type of Discharge: Inpatient Admission Primary Inpatient Discharge Diagnosis:: muscle weakness How have you been since you were released from the hospital?: Better Any questions or concerns?: No  Items Reviewed: Did you receive and understand the discharge instructions provided?: Yes Medications obtained,verified, and reconciled?: Yes (Medications Reviewed) Any new allergies since your discharge?: No Dietary orders reviewed?: Yes Do you have support at home?: Yes People in Home [RPT]: sibling(s)  Medications Reviewed Today: Medications Reviewed Today     Reviewed by Emmitt Pan, LPN (Licensed Practical Nurse) on 12/18/24 at [phone]  Med List Status: <None>   Medication Order Taking? Sig Documenting Provider Last Dose Status Informant  Accu-Chek FastClix Lancets MISC [id number] Yes Use to check blood sugar 2 times a day [provider]  Active   amLODipine  (NORVASC ) 5 MG tablet [id number] Yes Take 1 tablet (5 mg total) by mouth daily.  Patient taking differently: Take 5 mg by mouth every morning.   Bair, Kalpana, MD  Active   ascorbic acid  (VITAMIN C ) 500 MG tablet [id number] Yes Take 1 tablet (500 mg total) by mouth daily. Darci Pore, MD  Active   aspirin  EC 81 MG tablet [id number] Yes Take 1 tablet (81 mg total) by mouth daily. Swallow whole. Elisabeth Round R, NP  Active   Blood Glucose Monitoring Suppl (ACCU-CHEK GUIDE) w/Device KIT [id number] Yes Use to check blood sugar 2 times a day [provider]  Active   Cholecalciferol  (VITAMIN D ) 50 MCG (2000 UT) CAPS [id number] Yes Take 1 capsule (2,000 Units total) by mouth daily. Bair, Kalpana, MD  Active   clopidogrel  (PLAVIX ) 75 MG tablet [id number] Yes Take 1 tablet (75 mg total) by mouth daily. Schnier, Cordella MATSU, MD  Active Nursing Home Medication Administration Guide (MAG)  dapagliflozin  propanediol (FARXIGA ) 10 MG TABS tablet [id number] Yes Take 1 tablet (10 mg total) by mouth daily. Bair, Luke, MD  Active   glucose blood (ACCU-CHEK GUIDE) test strip [id number] Yes Use to check blood sugar 2 times a day [provider]  Active   icosapent  Ethyl (VASCEPA ) 1 g capsule [id number] Yes Take 2 capsules (2 g total) by mouth 2 (two) times daily. Bair, Luke, MD  Active   irbesartan  (AVAPRO ) 150 MG tablet [id number] Yes Take 1 tablet (150 mg total) by mouth daily. Pace, Round R, NP  Active   Iron , Ferrous Sulfate , 325 (65 Fe) MG TABS [id number] Yes Take 325 mg by mouth every other day.  Patient taking differently: Take 325 mg by mouth daily.   Bair, Kalpana, MD  Active   latanoprost  (XALATAN ) 0.005 % ophthalmic solution [id number] Yes Place 1 drop into both eyes at bedtime. [provider]  Active Nursing Home Medication Administration Guide (MAG)  losartan  (COZAAR ) 50 MG tablet [id number] Yes TAKE 1 TABLET BY MOUTH DAILY  Patient taking differently: Take 50 mg by mouth every morning.   Bair, Luke, MD  Active   metFORMIN  (GLUCOPHAGE ) 850 MG tablet [id number] Yes Take 1 tablet (850 mg total) by mouth 2 (two) times daily with a meal. Bair, Luke, MD  Active   Multiple Vitamin (MULTIVITAMIN  WITH MINERALS) TABS tablet [id number] Yes Take 1 tablet by mouth daily. Darci Pore, MD  Active   Nutritional Supplements (ENSURE HIGH PROTEIN PO) [id number] Yes Take 1 Can by mouth daily at 6 (six) AM. [provider]  Active   pentoxifylline  (TRENTAL ) 400 MG CR tablet [id number] Yes TAKE 1 TABLET BY MOUTH THREE TIMES DAILY WITH MEALS Brown, Fallon E, NP  Active    rosuvastatin  (CRESTOR ) 40 MG tablet [id number] Yes Take 1 tablet (40 mg total) by mouth daily. Bair, Luke, MD  Active   senna-docusate (SENOKOT-S) 8.6-50 MG tablet [id number] Yes Take 1 tablet by mouth at bedtime as needed for mild constipation. Darci Pore, MD  Active            Med Note ANICE, JANET L   Tue Sep 25, 2024  9:27 AM) Patient states, it's been more than a month.  sertraline  (ZOLOFT ) 100 MG tablet [id number] Yes Take 1 tablet (100 mg total) by mouth daily.  Patient taking differently: Take 100 mg by mouth every morning.   Bair, Luke, MD  Active             Home Care and Equipment/Supplies: Were Home Health Services Ordered?: Yes Name of Home Health Agency:: [facility] Has Agency set up a time to come to your home?: Yes First Home Health Visit Date: 12/18/24 Any new equipment or medical supplies ordered?: Yes Name of Medical supply agency?: unknown Were you able to get the equipment/medical supplies?: Yes Do you have any questions related to the use of the equipment/supplies?: No  Functional Questionnaire: Do you need assistance with bathing/showering or dressing?: Yes Do you need assistance with meal preparation?: Yes Do you need assistance with eating?: No Do you have difficulty maintaining continence: No Do you need assistance with getting out of bed/getting out of a chair/moving?: Yes Do you have difficulty managing or taking your medications?: Yes  Follow up appointments reviewed: PCP Follow-up appointment confirmed?: Yes Date of PCP follow-up appointment?: 12/27/24 Follow-up Provider: [facility] Follow-up appointment confirmed?: Yes Date of Specialist follow-up appointment?: 12/24/24 Follow-Up Specialty Provider:: surgeon Do you need transportation to your follow-up appointment?: No Do you understand care options if your condition(s) worsen?: Yes-patient verbalized understanding    SIGNATURE Julian Lemmings, LPN [facility]  Nurse Health Advisor Direct Dial [phone]  "

## 2024-12-24 ENCOUNTER — Encounter (INDEPENDENT_AMBULATORY_CARE_PROVIDER_SITE_OTHER): Payer: Self-pay | Admitting: Nurse Practitioner

## 2024-12-24 ENCOUNTER — Ambulatory Visit (INDEPENDENT_AMBULATORY_CARE_PROVIDER_SITE_OTHER): Admitting: Nurse Practitioner

## 2024-12-24 VITALS — BP 124/74 | HR 86 | Resp 17 | Ht 73.0 in

## 2024-12-24 DIAGNOSIS — Z9889 Other specified postprocedural states: Secondary | ICD-10-CM

## 2024-12-24 DIAGNOSIS — I739 Peripheral vascular disease, unspecified: Secondary | ICD-10-CM

## 2024-12-27 ENCOUNTER — Ambulatory Visit

## 2024-12-27 ENCOUNTER — Telehealth: Payer: Self-pay

## 2024-12-27 NOTE — Telephone Encounter (Signed)
 Copied from CRM #[id number]. Topic: Clinical - Home Health Verbal Orders >> Dec 27, 2024  3:11 PM Chasity T wrote: Caller/Agency: [facility] Callback Number: [phone] (can leave a message if sent to voicemail) Service Requested: Physical Therapy Frequency: 2 week 1 , 1 week 6 Any new concerns about the patient? No

## 2024-12-29 ENCOUNTER — Encounter (INDEPENDENT_AMBULATORY_CARE_PROVIDER_SITE_OTHER): Payer: Self-pay | Admitting: Nurse Practitioner

## 2024-12-29 NOTE — Progress Notes (Signed)
 "  Subjective:    Patient ID: Jordan Arellano, male    DOB: 1951/02/06, 74 y.o.   MRN: [id number] Chief Complaint  Patient presents with   Follow-up    Staple removal    HPI  Discussed the use of AI scribe software for clinical note transcription with the patient, who gave verbal consent to proceed.  History of Present Illness Jordan Arellano is a 74 year old male with peripheral artery disease and critical limb ischemia who presents for post-operative follow-up after recent vascular surgery.  He reports persistent numbness in the left lower extremity, localized from the knee proximally, which has been present since surgery. He also describes soreness at the left groin surgical site.  Prior to surgery, he had a non-healing ulcer on the plantar aspect of the residual limb, which has resolved following the procedure. He reports that he is now able to wear his prosthesis since the wound healed.  He was informed that a small piece of plastic was found and removed from the operative site during the procedure.    Results Staple removal, left groin surgical incision Staples removed from left groin incision. Incision with mild scabbing, no evidence of infection. Wound healing appropriately.   Review of Systems  Skin:  Positive for wound.  Neurological:  Positive for numbness.  All other systems reviewed and are negative.      Objective:   Physical Exam Vitals reviewed.  HENT:     Head: Normocephalic.  Cardiovascular:     Rate and Rhythm: Normal rate.  Pulmonary:     Effort: Pulmonary effort is normal.  Musculoskeletal:     Right Lower Extremity: Right leg is amputated below knee.  Skin:    General: Skin is warm and dry.  Neurological:     Mental Status: He is alert and oriented to person, place, and time.  Psychiatric:        Mood and Affect: Mood normal.        Behavior: Behavior normal.        Thought Content: Thought content normal.        Judgment: Judgment normal.      Physical Exam SKIN: Scab present, incision site healing well with no signs of infection.  BP 124/74   Pulse 86   Resp 17   Ht 6' 1 (1.854 m)   BMI 19.90 kg/m   Past Medical History:  Diagnosis Date   Acute hematogenous osteomyelitis of left foot (HCC) 05/19/2023   Anemia of chronic disease    Aortic atherosclerosis    Atherosclerosis of native arteries of extremity with rest pain (HCC)    Basal cell carcinoma 08/11/2022   R forearm - ED&C   Bifascicular block    Bilateral nephrolithiasis    Calculus of gallbladder without cholecystitis    Cessation of tobacco use in previous 12 months 09/01/2023   Cholelithiasis    CKD (chronic kidney disease) stage 2, GFR 60-89 ml/min    Conductive hearing loss in right ear    Coronary artery calcification seen on CT scan    Critical limb ischemia of right lower extremity (HCC)    Depression    Diverticulosis    DM (diabetes mellitus), type 2 (HCC)    Foot ulcer (HCC) 12/12/2016   Hyperlipidemia    Hypertension    Infrarenal abdominal aortic aneurysm (AAA) without rupture    Left renal artery stenosis    Long term current use of clopidogrel     Long-term use of aspirin   therapy    Mild cognitive impairment    Multiple pulmonary nodules    PAD (peripheral artery disease) ~2007   s/p R BKA for non-healing wound   PONV (postoperative nausea and vomiting)    after Oct 2025 LE intervention   Protein-calorie malnutrition, severe 05/24/2023   Status post below-knee amputation of right lower extremity (HCC)    Status post femoral-popliteal bypass surgery    Stroke (HCC) 03/2014   MRI: Acute nonhemorrhagic left paracentral pontine infarct. 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   TIA (transient ischemic attack) 01/2014   Vitamin D  deficiency     Social History   Socioeconomic History   Marital status: Widowed    Spouse name: Not on file   Number of  children: 2   Years of education: Not on file   Highest education level: Not on file  Occupational History    Comment: Disabled  Tobacco Use   Smoking status: Some Days    Current packs/day: 15.00    Average packs/day: 15.0 packs/day for 60.0 years (900.0 ttl pk-yrs)    Types: Cigarettes   Smokeless tobacco: Never  Vaping Use   Vaping status: Never Used  Substance and Sexual Activity   Alcohol use: No   Drug use: No   Sexual activity: Not Currently    Birth control/protection: Abstinence  Other Topics Concern   Not on file  Social History Narrative   One boy youngest, killed in MVA    Social Drivers of Health   Tobacco Use: High Risk (12/29/2024)   Patient History    Smoking Tobacco Use: Some Days    Smokeless Tobacco Use: Never    Passive Exposure: Not on file  Financial Resource Strain: Low Risk  (10/03/2024)   Received from [facility]   Overall Financial Resource Strain (CARDIA)    Difficulty of Paying Living Expenses: Not hard at all  Food Insecurity: No Food Insecurity (11/21/2024)   Epic    Worried About Radiation Protection Practitioner of Food in the Last Year: Never true    Ran Out of Food in the Last Year: Never true  Transportation Needs: No Transportation Needs (11/21/2024)   Epic    Lack of Transportation (Medical): No    Lack of Transportation (Non-Medical): No  Physical Activity: Insufficiently Active (08/18/2023)   Exercise Vital Sign    Days of Exercise per Week: 3 days    Minutes of Exercise per Session: 30 min  Stress: No Stress Concern Present (08/18/2023)   [employer] of Occupational Health - Occupational Stress Questionnaire    Feeling of Stress : Not at all  Social Connections: Socially Isolated (11/21/2024)   Social Connection and Isolation Panel    Frequency of Communication with Friends and Family: More than three times a week    Frequency of Social Gatherings with Friends and Family: More than three times a week    Attends Religious  Services: Never    Database Administrator or Organizations: No    Attends Banker Meetings: Never    Marital Status: Widowed  Intimate Partner Violence: Not At Risk (11/21/2024)   Epic    Fear of Current or Ex-Partner: No    Emotionally Abused: No    Physically Abused: No    Sexually Abused: No  Depression (PHQ2-9): Medium Risk (06/28/2024)   Depression (PHQ2-9)    PHQ-2 Score: 9  Alcohol Screen: Low Risk (08/18/2023)  Alcohol Screen    Last Alcohol Screening Score (AUDIT): 0  Housing: Low Risk (11/21/2024)   Epic    Unable to Pay for Housing in the Last Year: No    Number of Times Moved in the Last Year: 0    Homeless in the Last Year: No  Utilities: Not At Risk (11/21/2024)   Epic    Threatened with loss of utilities: No  Health Literacy: Adequate Health Literacy (08/18/2023)   B1300 Health Literacy    Frequency of need for help with medical instructions: Never    Past Surgical History:  Procedure Laterality Date   AMPUTATION TOE Left 05/21/2023   Procedure: AMPUTATION LEFT 1st & 3rd TOES;  Surgeon: Neill Boas, DPM;  Location: [facility];  Service: Podiatry;  Laterality: Left;   APPLICATION OF CELL SAVER N/A 11/21/2024   Procedure: APPLICATION OF CELL SAVER;  Surgeon: Jama Cordella MATSU, MD;  Location: [facility];  Service: Vascular;  Laterality: N/A;   BELOW KNEE LEG AMPUTATION Right    ENDARTERECTOMY FEMORAL Right 11/21/2024   Procedure: ENDARTERECTOMY, FEMORAL;  Surgeon: Jama Cordella MATSU, MD;  Location: [facility];  Service: Vascular;  Laterality: Right;   FEMORAL-POPLITEAL BYPASS GRAFT Left 12/17/2016   Procedure: BYPASS LEFT FEMORAL TO BELOW POPLITEAL ARTERY USING PROPATEN GORE GRAFT;  Surgeon: Boas JULIANNA Doing, MD;  Location: [facility];  Service: Vascular;  Laterality: Left;   FEMORAL-POPLITEAL BYPASS GRAFT Left 09/30/2017   Procedure: LEFT LEG ANGIOGRAM,  THROMBECTOMY, FEM-POPLITEAL BYPASS GRAFT, tHROMBECTOMY PERONEAL ARTERY AND POSTERIOR TIBIAL , ENDARTERECTOMY  TIBIAL/PERONEAL TRUNK WITH BOVINE PATCH ANGIOPLASTY.;  Surgeon: Laurence Redell CROME, MD;  Location: [facility] OR;  Service: Vascular;  Laterality: Left;   INTRAMEDULLARY (IM) NAIL INTERTROCHANTERIC Right 03/21/2023   Procedure: INTRAMEDULLARY (IM) NAIL INTERTROCHANTERIC;  Surgeon: Cleotilde Barrio, MD;  Location: [facility];  Service: Orthopedics;  Laterality: Right;   LOWER EXTREMITY ANGIOGRAPHY Left 12/14/2022   Procedure: Lower Extremity Angiography;  Surgeon: Jama Cordella MATSU, MD;  Location: [facility];  Service: Cardiovascular;  Laterality: Left;   LOWER EXTREMITY ANGIOGRAPHY Left 05/20/2023   Procedure: Lower Extremity Angiography;  Surgeon: Marea Selinda RAMAN, MD;  Location: [facility];  Service: Cardiovascular;  Laterality: Left;   LOWER EXTREMITY ANGIOGRAPHY Left 05/26/2023   Procedure: Lower Extremity Angiography;  Surgeon: Marea Selinda RAMAN, MD;  Location: [facility];  Service: Cardiovascular;  Laterality: Left;   LOWER EXTREMITY ANGIOGRAPHY Left 09/25/2024   Procedure: Lower Extremity Angiography;  Surgeon: Jama Cordella MATSU, MD;  Location: [facility];  Service: Cardiovascular;  Laterality: Left;   LOWER EXTREMITY INTERVENTION  05/20/2023   Procedure: LOWER EXTREMITY INTERVENTION;  Surgeon: Marea Selinda RAMAN, MD;  Location: [facility];  Service: Cardiovascular;;   LOWER EXTREMITY INTERVENTION Left 09/25/2024   Procedure: LOWER EXTREMITY INTERVENTION;  Surgeon: Jama Cordella MATSU, MD;  Location: [facility];  Service: Cardiovascular;  Laterality: Left;   PERIPHERAL VASCULAR CATHETERIZATION Left 12/14/2016   Procedure: Abdominal Aortogram w/Lower Extremity;  Surgeon: Lonni RAMAN Blade, MD;  Location: [facility] INVAS[facility] CV LAB;  Service: Cardiovascular;  Laterality: Left;   right cataract extraction     THROMBECTOMY FEMORAL ARTERY Right 11/21/2024   Procedure: THROMBECTOMY, ARTERY, FEMORAL;  Surgeon: Jama Cordella MATSU, MD;  Location: [facility];  Service: Vascular;   Laterality: Right;   TRANSTHORACIC ECHOCARDIOGRAM  01/2014   To evaluate possible CVA: EF 55-60%. GR 1 DD. No significant valvular lesions    Family History  Problem Relation Age of  Onset   Hypertension Mother        Does not know history   Heart disease Mother    Stroke Mother    Diabetes Mother    Hypertension Father    Heart disease Father    Stroke Father    Diabetes Sister    Hypertension Sister    Heart disease Brother    Hypertension Brother     Allergies[1]     Latest Ref Rng & Units 11/24/2024    4:09 AM 11/23/2024    4:37 AM 11/22/2024    5:32 AM  CBC  WBC 4.0 - 10.5 K/uL 8.8  9.7  10.2   Hemoglobin 13.0 - 17.0 g/dL 89.3  88.7  9.2   Hematocrit 39.0 - 52.0 % 32.6  34.2  29.0   Platelets 150 - 400 K/uL 149  157  144       CMP     Component Value Date/Time   NA 135 11/24/2024 0409   NA 138 10/18/2024 1420   K 3.7 11/24/2024 0409   CL 100 11/24/2024 0409   CO2 24 11/24/2024 0409   GLUCOSE 118 (H) 11/24/2024 0409   BUN 19 11/24/2024 0409   BUN 29 (H) 10/18/2024 1420   CREATININE 1.09 11/24/2024 0409   CALCIUM  8.7 (L) 11/24/2024 0409   PROT 7.3 06/28/2024 1353   ALBUMIN  4.5 06/28/2024 1353   AST 15 06/28/2024 1353   ALT 12 06/28/2024 1353   ALKPHOS 57 06/28/2024 1353   BILITOT 0.4 06/28/2024 1353   GFR 60.65 06/28/2024 1353   EGFR 40 (L) 10/18/2024 1420   GFRNONAA >60 11/24/2024 0409     No results found.     Assessment & Plan:   1. PAD s/p right BKA, history revascularization left leg (Primary) Critical limb ischemia of right lower extremity Post-surgical numbness above the knee is normal. Incision healing well with mild scabbing, no infection. Improved perfusion indicated by healed amputation site wound. [Ambulation improved.]   - Removed surgical staples from the incision site. - Arranged for nursing staff to clean and dress the incision. - Scheduled follow-up appointment in two weeks to reassess healing. - Planned to obtain a lower  extremity ultrasound at the next visit to assess vessel patency and blood flow.  2. Peripheral arterial disease with history of revascularization Peripheral artery disease Managed with recent surgery. [Improved wound healing, no ischemia or infection.]  Continued surveillance needed. - [Planned to monitor for signs of recurrent ischemia or wound complications at follow-up.] - Planned to assess vascular status with ultrasound at the next visit.   Assessment and Plan Assessment & Plan        Medications Ordered Prior to Encounter[2]  There are no Patient Instructions on file for this visit. Return in about 2 weeks (around 01/07/2025) for R art duplex GS/FB.   Leoncio Hansen E Graylin Sperling, NP      [1] No Known Allergies [2]  Current Outpatient Medications on File Prior to Visit  Medication Sig Dispense Refill   Accu-Chek FastClix Lancets MISC Use to check blood sugar 2 times a day     amLODipine  (NORVASC ) 5 MG tablet Take 1 tablet (5 mg total) by mouth daily. (Patient taking differently: Take 5 mg by mouth every morning.) 90 tablet 3   ascorbic acid  (VITAMIN C ) 500 MG tablet Take 1 tablet (500 mg total) by mouth daily. 30 tablet 2   aspirin  EC 81 MG tablet Take 1 tablet (81 mg total) by mouth daily. Swallow  whole. 30 tablet 12   Blood Glucose Monitoring Suppl (ACCU-CHEK GUIDE) w/Device KIT Use to check blood sugar 2 times a day     Cholecalciferol (VITAMIN D ) 50 MCG (2000 UT) CAPS Take 1 capsule (2,000 Units total) by mouth daily. 90 capsule 3   dapagliflozin  propanediol (FARXIGA ) 10 MG TABS tablet Take 1 tablet (10 mg total) by mouth daily. 90 tablet 3   glucose blood (ACCU-CHEK GUIDE) test strip Use to check blood sugar 2 times a day     icosapent  Ethyl (VASCEPA ) 1 g capsule Take 2 capsules (2 g total) by mouth 2 (two) times daily. 360 capsule 3   irbesartan  (AVAPRO ) 150 MG tablet Take 1 tablet (150 mg total) by mouth daily. 30 tablet 11   Iron , Ferrous Sulfate , 325 (65 Fe) MG TABS Take 325  mg by mouth every other day. (Patient taking differently: Take 325 mg by mouth daily.) 90 tablet 0   latanoprost  (XALATAN ) 0.005 % ophthalmic solution Place 1 drop into both eyes at bedtime.     losartan  (COZAAR ) 50 MG tablet TAKE 1 TABLET BY MOUTH DAILY (Patient taking differently: Take 50 mg by mouth every morning.) 100 tablet 2   metFORMIN  (GLUCOPHAGE ) 850 MG tablet Take 1 tablet (850 mg total) by mouth 2 (two) times daily with a meal. 180 tablet 3   Multiple Vitamin (MULTIVITAMIN WITH MINERALS) TABS tablet Take 1 tablet by mouth daily. 30 tablet 3   Nutritional Supplements (ENSURE HIGH PROTEIN PO) Take 1 Can by mouth daily at 6 (six) AM.     pentoxifylline  (TRENTAL ) 400 MG CR tablet TAKE 1 TABLET BY MOUTH THREE TIMES DAILY WITH MEALS 90 tablet 1   rosuvastatin  (CRESTOR ) 40 MG tablet Take 1 tablet (40 mg total) by mouth daily. 90 tablet 3   senna-docusate (SENOKOT-S) 8.6-50 MG tablet Take 1 tablet by mouth at bedtime as needed for mild constipation. 30 tablet 0   sertraline  (ZOLOFT ) 100 MG tablet Take 1 tablet (100 mg total) by mouth daily. (Patient taking differently: Take 100 mg by mouth every morning.) 90 tablet 3   clopidogrel  (PLAVIX ) 75 MG tablet Take 1 tablet (75 mg total) by mouth daily. (Patient not taking: Reported on 12/24/2024) 30 tablet 5   No current facility-administered medications on file prior to visit.   "

## 2025-01-01 ENCOUNTER — Telehealth: Payer: Self-pay

## 2025-01-01 ENCOUNTER — Encounter

## 2025-01-01 NOTE — Telephone Encounter (Signed)
 Copied from CRM #[id number]. Topic: Appointments - Appointment Cancel/Reschedule >> Jan 01, 2025  8:53 AM Robinson H wrote: Patients sister calling to reschedule patients appointment for today at 2:30, agent not able to reschedule since original appointment is scheduled for 60 minutes, the system is only giving me a 30 minutes slot for physical. Patients sister states a nurse scheduled appointment.  [name] [phone]  I spoke with [name] and rescheduled patient's appointment with Dr. Luke Shade.  This appointment was originally scheduled as a 60-minute physical.  I rescheduled it as a 30-minute physical on 01/10/2025.

## 2025-01-02 ENCOUNTER — Ambulatory Visit: Payer: Self-pay

## 2025-01-02 NOTE — Telephone Encounter (Signed)
 FYI Only or Action Required?: Action required by provider: clinical question for provider and update on patient condition.  Patient was last seen in primary care on 06/28/2024 by Jordan Bruckner, MD.  Called Nurse Triage reporting Open Wound.  Symptoms began several days ago.  Interventions attempted: Other: wound care.  Symptoms are: gradually worsening.  Triage Disposition: See Physician Within 24 Hours  Patient/caregiver understands and will follow disposition?: No, wishes to speak with PCP   Message from Jordan Arellano sent at 01/02/2025  5:00 PM EST  Reason for Triage: Pt's sister on the line stating home health nurse has sent her an image that shows pt has an infection on his leg. Pt is severe diabetic. Pt had an amuputation and it looks like there is an infection. Sister is req antibiotics. Transferring to [facility]      Reason for Disposition  [1] Incision gaping open AND [2] > 48 hours since wound re-opened AND [3] length of opening > 1/2 inch (12 mm)  Answer Assessment - Initial Assessment Questions Pt's sister, Jordan Arellano, contacted clinic and pt on phone with her husband to provide specific details as Jordan Arellano nurse was with him earlier; pt does not live with sister and she is currently iced in so no way to get to pt or get pt to clinic. She states Jordan Arellano nurse sent her a photo earlier stating that site where staples were removed seems to be infected. 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 She states that vascular surgery has been trying to save pt's leg and she is very worried about infection. Questioned if she had f/u with surgery but she would like send photo to PCP. She does not have access to pt's my chart and requested practice email for her to send photos from [name] nurse. She is requesting abx for infection and stated [name] RN will be in [place] tomorrow and willing to pick up rx for pt d/t weather limiting transportation. She requested a call back as pt does not answer his  phone and [name] nurse is not always with him.       1. SYMPTOM: What's the main symptom you're concerned about? (e.g., drainage, incision opened up, pain, redness)     Two open wounds at previous incision site where staples were removed 12/24/24  2. ONSET: When did wound  start?     Unsure; pt had HH nurse today and per nurse wounds look to be infected   3. SURGERY: What surgery did you have?     R BKA   4. DATE of SURGERY: When was the surgery?      11/21/24  6. REDNESS: Is there any redness at the incision site? If Yes, ask: How wide across is the redness? (Inches, centimeters)      Yes; two wounds, one site appears to be size of the end of a cigarette, red and open. Second located in fold of groin, appears deep and size of a qtip   7. PAIN: Is there any pain? If Yes, ask: How bad is it?  (Scale 0-10; or none, mild, moderate, severe)     No  Protocols used: Post-Op Incision Symptoms and Questions-A-AH

## 2025-01-02 NOTE — Telephone Encounter (Signed)
 [name] called back and we scheduled patient for next available 60-minute slot on 04/03/2025 at 2pm.

## 2025-01-03 ENCOUNTER — Encounter: Payer: Self-pay | Admitting: Family

## 2025-01-03 ENCOUNTER — Telehealth: Payer: Self-pay | Admitting: Family

## 2025-01-03 ENCOUNTER — Ambulatory Visit: Admitting: Family

## 2025-01-03 ENCOUNTER — Telehealth (INDEPENDENT_AMBULATORY_CARE_PROVIDER_SITE_OTHER): Payer: Self-pay

## 2025-01-03 ENCOUNTER — Ambulatory Visit: Payer: Self-pay

## 2025-01-03 VITALS — BP 128/60 | HR 81 | Temp 97.6°F | Ht 73.0 in | Wt 151.4 lb

## 2025-01-03 DIAGNOSIS — S31109A Unspecified open wound of abdominal wall, unspecified quadrant without penetration into peritoneal cavity, initial encounter: Secondary | ICD-10-CM

## 2025-01-03 DIAGNOSIS — Z7984 Long term (current) use of oral hypoglycemic drugs: Secondary | ICD-10-CM | POA: Diagnosis not present

## 2025-01-03 DIAGNOSIS — E118 Type 2 diabetes mellitus with unspecified complications: Secondary | ICD-10-CM

## 2025-01-03 LAB — POCT GLYCOSYLATED HEMOGLOBIN (HGB A1C): HbA1c POC (<> result, manual entry): 6.1 %

## 2025-01-03 MED ORDER — DOXYCYCLINE HYCLATE 100 MG PO TABS: 100.0000 mg | ORAL_TABLET | Freq: Two times a day (BID) | ORAL | 0 refills | Status: AC

## 2025-01-03 NOTE — Telephone Encounter (Signed)
 Spoke with [facility] per Dr Abbey to see if patient can be seen sooner by their office. Representative states the patient is scheduled for an appointment with their office at 9 am, as they did not have anything for the patient today. Per Dr Abbey wanted me to leave a message for the Nurse Line to give our office a call back to see if they are OK with sending in treatment until his scheduled appointment, as patient was just seen in their office 12/24/24. Left message for them to give our office a call back to discuss per Dr Abbey.   OK for E2C2 to ask for Aybree Lanyon to speak with Nurse in regards to Dr Graylon concerns.

## 2025-01-03 NOTE — Telephone Encounter (Signed)
 Destiny from patients PCP office called at this time stating patient was seen in our office 12/24/24 for staple removal and is experiencing open area and possible infection per PCP, they were requesting patient to be seen today in our office, he is scheduled to be seen in office tomorrow 01/04/25 @ 9AM, however patient does have an appointment today at [facility] @ 4pm. They reached out to us  because patient was requesting antibiotics for area of concern. Just wanted to make you aware, pt does see Dew in the morning.      Note from triage call:  Spoke with [facility] per Dr Abbey to see if patient can be seen sooner by their office. Representative states the patient is scheduled for an appointment with their office at 9 am, as they did not have anything for the patient today. Per Dr Abbey wanted me to leave a message for the Nurse Line to give our office a call back to see if they are OK with sending in treatment until his scheduled appointment, as patient was just seen in their office 12/24/24. Left message for them to give our office a call back to discuss per Dr Abbey.    OK for E2C2 to ask for Destiny to speak with Nurse in regards to Dr Graylon concerns.

## 2025-01-03 NOTE — Telephone Encounter (Signed)
 He should really be following up with vascular surgery for this. Please reach out to [facility] to see if he can be seen today instead of scheduled appointment for 01/04/25 at 10 AM with Dr. Marea.  Luke Shade, MD

## 2025-01-03 NOTE — Patient Instructions (Addendum)
 Start doxycycline   Ensure to take probiotics while on antibiotics and also for 2 weeks after completion. This can either be by eating yogurt daily or taking a probiotic supplement over the counter such as Culturelle.It is important to re-colonize the gut with good bacteria and also to prevent any diarrheal infections associated with antibiotic use.    Please keep follow up with vascular as discussed  I have a call out to home health to add wound care

## 2025-01-03 NOTE — Telephone Encounter (Signed)
 Jordan Arellano returned call from [facility] and informed her of Dr Graylon recommendations. Jordan Arellano states she is going to get a message typed up for the provider and see if they can see the patient sooner than tomorrow at 9 am or if they can send medication to treat the wound. Verbalized understanding.

## 2025-01-03 NOTE — Telephone Encounter (Signed)
 Noted. FYI

## 2025-01-03 NOTE — Telephone Encounter (Signed)
 That's fine

## 2025-01-03 NOTE — Telephone Encounter (Signed)
 This pt is scheduled to see Jordan Arellano today, when pt shows up he will be taken care of thank you :)

## 2025-01-03 NOTE — Telephone Encounter (Signed)
 Call home health Katherin Pool wound care nurse [phone]  [facility] [phone]  Verbal order to increase visits based on her evaluation. Please increase frequency and to include wound care for right groin wound dehiscence

## 2025-01-03 NOTE — Telephone Encounter (Signed)
 FYI Only or Action Required?: FYI only for provider: appointment scheduled on 1/29.  Patient was last seen in primary care on 06/28/2024 by Abbey Bruckner, MD.  Called Nurse Triage reporting No chief complaint on file..  Symptoms began several days ago.  Interventions attempted: Other: wound care RN.  Symptoms are: gradually worsening.  Triage Disposition: Information or Advice Only Call  Patient/caregiver understands and will follow disposition?: Yes  Copied from CRM #[id number]. Topic: Clinical - Medical Advice >> Jan 03, 2025  9:28 AM Olam RAMAN wrote: Reason for CRM:on his right [place] area, had surgery and now is open and might be infected per caller. Asked if provider would rx antibotics until pt sees surgeon. Is red and some drainage Pt is diabetic and would need something asap Nurse visit will be twice a week Reason for Disposition  Health information question, no triage required and triager able to answer question  Answer Assessment - Initial Assessment Questions 1. REASON FOR CALL: What is the main reason for your call? or How can I best help you?     Calling to let provider know the wound site with redness, and depth of 0.5-0.7. requesting ABX-  Concern that the weather wont allow them to make Vascular appt tomorrow. Pt diabetic and needing wound assessed ASAP.  2. SYMPTOMS : Do you have any symptoms?      Redness and increased depth- no new symptoms from triage yesterday 1/28 3. OTHER QUESTIONS: Do you have any other questions?     Appt with PCP office this afternoon. ED precautions reviewed  Protocols used: Information Only Call - No Triage-A-AH

## 2025-01-04 ENCOUNTER — Ambulatory Visit (INDEPENDENT_AMBULATORY_CARE_PROVIDER_SITE_OTHER): Admitting: Vascular Surgery

## 2025-01-04 ENCOUNTER — Telehealth: Payer: Self-pay

## 2025-01-04 ENCOUNTER — Other Ambulatory Visit (INDEPENDENT_AMBULATORY_CARE_PROVIDER_SITE_OTHER): Payer: Self-pay | Admitting: Nurse Practitioner

## 2025-01-04 ENCOUNTER — Ambulatory Visit (INDEPENDENT_AMBULATORY_CARE_PROVIDER_SITE_OTHER): Admitting: Nurse Practitioner

## 2025-01-04 ENCOUNTER — Ambulatory Visit (INDEPENDENT_AMBULATORY_CARE_PROVIDER_SITE_OTHER)

## 2025-01-04 VITALS — BP 90/52 | HR 80 | Resp 17 | Ht 73.0 in | Wt 151.0 lb

## 2025-01-04 DIAGNOSIS — Z9889 Other specified postprocedural states: Secondary | ICD-10-CM

## 2025-01-04 DIAGNOSIS — I739 Peripheral vascular disease, unspecified: Secondary | ICD-10-CM | POA: Diagnosis not present

## 2025-01-04 NOTE — Telephone Encounter (Signed)
 Is it okay to give verbal orders? Dr. Abbey is out of the office.

## 2025-01-04 NOTE — Telephone Encounter (Signed)
 Copied from CRM #[id number]. Topic: Clinical - Home Health Verbal Orders >> Jan 04, 2025  1:38 PM [name] wrote: Caller/Agency: Zacharia from [facility] Callback Number: [phone] Service Requested: Occupational Therapy Frequency: one time a week for 4 weeks, following week a phone call once a week for 3 weeks Any new concerns about the patient? No

## 2025-01-04 NOTE — Assessment & Plan Note (Signed)
 Chronic, excellent control. A1c today 6.1 Continue Farxiga  10 mg daily as managed by Dr. Abbey.

## 2025-01-04 NOTE — Telephone Encounter (Signed)
 Called Nena to relay your message she is aware. She wanted to know if there is anything else specific you suggest she does with the wound. Nurse is currently cleaning it with betadine & keeping the wound dressed.

## 2025-01-04 NOTE — Assessment & Plan Note (Addendum)
 Afebrile, nontoxic in appearance.  Discussed with patient's son and patient's sister (speaker phone) in the room today that appearance of wound may in fact be slough with wound dehiscence versus gross infection.  Picture in chart added from 01/03/2025 taken from patient's phone in which wound dehiscence appeared larger.  On exam today, dehiscence slightly smaller ( see photo).  Due to comorbidities and risk of infection, we opted to start doxycycline  100 mg twice daily x 7 days.  Pending wound culture.  We discussed referral to wound care as patient may benefit from wound debridement.  He currently has a home health nurse and sister prefers to use home health versus the wound center at this time.  Call out to add on wound care and increased frequency of visits.  Patient understands importance of keeping follow-up with vascular for repeat ultrasound.  Vascular follow-up scheduled 01/04/2025

## 2025-01-04 NOTE — Telephone Encounter (Signed)
 Okay to give verbal order.

## 2025-01-07 ENCOUNTER — Ambulatory Visit (INDEPENDENT_AMBULATORY_CARE_PROVIDER_SITE_OTHER): Admitting: Vascular Surgery

## 2025-01-07 ENCOUNTER — Encounter (INDEPENDENT_AMBULATORY_CARE_PROVIDER_SITE_OTHER)

## 2025-01-07 ENCOUNTER — Telehealth (INDEPENDENT_AMBULATORY_CARE_PROVIDER_SITE_OTHER): Payer: Self-pay

## 2025-01-07 ENCOUNTER — Encounter (INDEPENDENT_AMBULATORY_CARE_PROVIDER_SITE_OTHER): Payer: Self-pay | Admitting: Nurse Practitioner

## 2025-01-07 LAB — AEROBIC CULTURE
MICRO NUMBER:: 17532271
SPECIMEN QUALITY:: ADEQUATE

## 2025-01-07 NOTE — Telephone Encounter (Signed)
 Patients sister left message on triage line to request call back in regard to [facility] order changes. Per chart patient has [facility] already, call to them to confirm his case is still active at this time spoke to Crystal whom confirmed it is @ [phone] she asks that we fax over updated orders to their office @ [phone]. Call to Sister to advise that they will be reaching out when they have what they need for scheduling. No further questions at this time.

## 2025-01-08 ENCOUNTER — Other Ambulatory Visit: Payer: Self-pay | Admitting: Family

## 2025-01-08 ENCOUNTER — Ambulatory Visit: Payer: Self-pay | Admitting: Family

## 2025-01-08 ENCOUNTER — Ambulatory Visit: Admitting: *Deleted

## 2025-01-08 VITALS — BP 130/72 | Ht 73.0 in | Wt 145.0 lb

## 2025-01-08 DIAGNOSIS — S31109A Unspecified open wound of abdominal wall, unspecified quadrant without penetration into peritoneal cavity, initial encounter: Secondary | ICD-10-CM

## 2025-01-08 DIAGNOSIS — Z0000 Encounter for general adult medical examination without abnormal findings: Secondary | ICD-10-CM

## 2025-01-08 MED ORDER — CIPROFLOXACIN HCL 500 MG PO TABS: 500.0000 mg | ORAL_TABLET | Freq: Two times a day (BID) | ORAL | 0 refills | Status: AC

## 2025-01-08 NOTE — Patient Instructions (Signed)
 Jordan Arellano,  Thank you for taking the time for your Medicare Wellness Visit. I appreciate your continued commitment to your health goals. Please review the care plan we discussed, and feel free to reach out if I can assist you further.  Please note that Annual Wellness Visits do not include a physical exam. Some assessments may be limited, especially if the visit was conducted virtually. If needed, we may recommend an in-person follow-up with your provider.  Ongoing Care Seeing your primary care provider every 3 to 6 months helps us  monitor your health and provide consistent, personalized care.  Consider updating your flu and shingles vaccines.   Referrals If a referral was made during today's visit and you haven't received any updates within two weeks, please contact the referred provider directly to check on the status.  Recommended Screenings:  Health Maintenance  Topic Date Due   COVID-19 Vaccine (1) Never done   Zoster (Shingles) Vaccine (1 of 2) Never done   Cologuard (Stool DNA test)  03/04/2022   Eye exam for diabetics  08/19/2023   Complete foot exam   02/01/2024   Flu Shot  07/06/2024   Pneumococcal Vaccine for age over 57 (2 of 2 - PPSV23, PCV20, or PCV21) 06/28/2025*   Kidney health urinalysis for diabetes  01/09/2025   Screening for Lung Cancer  05/22/2025   Hemoglobin A1C  07/03/2025   Yearly kidney function blood test for diabetes  11/24/2025   Medicare Annual Wellness Visit  01/08/2026   DTaP/Tdap/Td vaccine (2 - Td or Tdap) 05/27/2031   Hepatitis C Screening  Completed   Meningitis B Vaccine  Aged Out  *Topic was postponed. The date shown is not the original due date.       01/08/2025    2:08 PM  Advanced Directives  Does Patient Have a Medical Advance Directive? Yes  Type of Estate Agent of [place];Living will  Does patient want to make changes to medical advance directive? No - Patient declined  Copy of Healthcare Power of Attorney in  Chart? Yes - validated most recent copy scanned in chart (See row information)    Vision: Annual vision screenings are recommended for early detection of glaucoma, cataracts, and diabetic retinopathy. These exams can also reveal signs of chronic conditions such as diabetes and high blood pressure.  Dental: Annual dental screenings help detect early signs of oral cancer, gum disease, and other conditions linked to overall health, including heart disease and diabetes.  Please see the attached documents for additional preventive care recommendations.

## 2025-01-08 NOTE — Telephone Encounter (Signed)
 Left message for Zacharia from [facility] to call the office back regarding this Patient.

## 2025-01-08 NOTE — Telephone Encounter (Signed)
 noted

## 2025-01-09 ENCOUNTER — Telehealth: Payer: Self-pay

## 2025-01-09 ENCOUNTER — Other Ambulatory Visit (INDEPENDENT_AMBULATORY_CARE_PROVIDER_SITE_OTHER): Payer: Self-pay | Admitting: Nurse Practitioner

## 2025-01-09 ENCOUNTER — Telehealth (INDEPENDENT_AMBULATORY_CARE_PROVIDER_SITE_OTHER): Payer: Self-pay

## 2025-01-09 NOTE — Telephone Encounter (Signed)
 No she changed his abx based on the wound culture result, which we would advise.  His wound is going to take some time to heal, so I would recommend we continue with the regime that I recommended, which is the santyl with packing.  By Monday, we should be able to see if there is progress if or if an adjustment to care needs to be made

## 2025-01-09 NOTE — Telephone Encounter (Signed)
 Left message for Jordan Arellano at [facility] to call the office back regarding this Patient. Need to give verbal orders.

## 2025-01-09 NOTE — Telephone Encounter (Signed)
 Copied from CRM #[id number]. Topic: Referral - Question >> Jan 09, 2025  8:33 AM Robinson H wrote: Reason for CRM: Patients sister calling to see if wound care referral can be sent to facility below, states it's close to patients house. [facility] [address] [place], [place] [address] [phone]  Jenkins [phone]

## 2025-01-09 NOTE — Telephone Encounter (Signed)
 Call to pts sister advised her below per NP pt ok to be seen still on Monday and not sooner. No further questions.

## 2025-01-09 NOTE — Progress Notes (Signed)
 Noted.

## 2025-01-09 NOTE — Telephone Encounter (Signed)
 Copied from CRM #[id number]. Topic: General - Call Back - No Documentation >> Jan 09, 2025 10:00 AM Shereese L wrote: Reason for CRM: Patient sister is returning office call. Patient sister is requesting a call back from missed call due to making the appt for wound care

## 2025-01-09 NOTE — Telephone Encounter (Signed)
 Patients sister called to inquire if pt should be seen sooner than Monday, she has reached out to primary whom has cultured wound, started patient on antibiotic and initiated a referral to Wound Care, please advise

## 2025-01-10 ENCOUNTER — Encounter

## 2025-01-14 ENCOUNTER — Ambulatory Visit (INDEPENDENT_AMBULATORY_CARE_PROVIDER_SITE_OTHER): Admitting: Nurse Practitioner

## 2025-04-03 ENCOUNTER — Encounter

## 2025-11-06 ENCOUNTER — Ambulatory Visit: Admitting: Urology

## 2026-01-13 ENCOUNTER — Ambulatory Visit
